# Patient Record
Sex: Female | Born: 1945
Health system: Southern US, Community
[De-identification: ages and names within clinical notes are randomized; demographics above are authoritative.]

## PROBLEM LIST (undated history)

## (undated) DIAGNOSIS — E7800 Pure hypercholesterolemia, unspecified: Secondary | ICD-10-CM

## (undated) DIAGNOSIS — I2510 Atherosclerotic heart disease of native coronary artery without angina pectoris: Secondary | ICD-10-CM

## (undated) DIAGNOSIS — K219 Gastro-esophageal reflux disease without esophagitis: Secondary | ICD-10-CM

## (undated) DIAGNOSIS — R569 Unspecified convulsions: Secondary | ICD-10-CM

## (undated) DIAGNOSIS — I493 Ventricular premature depolarization: Secondary | ICD-10-CM

## (undated) DIAGNOSIS — I10 Essential (primary) hypertension: Secondary | ICD-10-CM

## (undated) DIAGNOSIS — N390 Urinary tract infection, site not specified: Secondary | ICD-10-CM

## (undated) DIAGNOSIS — E079 Disorder of thyroid, unspecified: Secondary | ICD-10-CM

## (undated) DIAGNOSIS — M1990 Unspecified osteoarthritis, unspecified site: Secondary | ICD-10-CM

## (undated) DIAGNOSIS — M545 Low back pain, unspecified: Secondary | ICD-10-CM

## (undated) DIAGNOSIS — G8929 Other chronic pain: Secondary | ICD-10-CM

## (undated) DIAGNOSIS — K5792 Diverticulitis of intestine, part unspecified, without perforation or abscess without bleeding: Secondary | ICD-10-CM

## (undated) DIAGNOSIS — G44009 Cluster headache syndrome, unspecified, not intractable: Secondary | ICD-10-CM

## (undated) HISTORY — PX: VASCULAR SURGERY: SHX849

## (undated) HISTORY — PX: JOINT REPLACEMENT: SHX530

## (undated) HISTORY — DX: Ventricular premature depolarization: I49.3

---

## 1968-10-26 HISTORY — PX: TUBAL LIGATION: SHX77

## 1985-10-26 HISTORY — PX: ABDOMINAL HYSTERECTOMY: SHX81

## 1993-10-26 HISTORY — PX: CORONARY ANGIOPLASTY WITH STENT PLACEMENT: SHX49

## 1998-05-31 ENCOUNTER — Encounter: Admission: RE | Admit: 1998-05-31 | Discharge: 1998-05-31 | Payer: Self-pay | Admitting: Family Medicine

## 1998-06-14 ENCOUNTER — Encounter: Admission: RE | Admit: 1998-06-14 | Discharge: 1998-06-14 | Payer: Self-pay | Admitting: Family Medicine

## 1998-08-27 ENCOUNTER — Encounter: Admission: RE | Admit: 1998-08-27 | Discharge: 1998-08-27 | Payer: Self-pay | Admitting: Family Medicine

## 1998-09-02 ENCOUNTER — Encounter: Admission: RE | Admit: 1998-09-02 | Discharge: 1998-09-02 | Payer: Self-pay | Admitting: Family Medicine

## 1999-04-09 ENCOUNTER — Encounter: Admission: RE | Admit: 1999-04-09 | Discharge: 1999-04-09 | Payer: Self-pay | Admitting: Family Medicine

## 2000-01-19 ENCOUNTER — Encounter: Admission: RE | Admit: 2000-01-19 | Discharge: 2000-01-19 | Payer: Self-pay | Admitting: Family Medicine

## 2000-01-21 ENCOUNTER — Encounter: Admission: RE | Admit: 2000-01-21 | Discharge: 2000-01-21 | Payer: Self-pay | Admitting: Family Medicine

## 2000-02-26 ENCOUNTER — Other Ambulatory Visit: Admission: RE | Admit: 2000-02-26 | Discharge: 2000-02-26 | Payer: Self-pay | Admitting: Obstetrics and Gynecology

## 2000-04-13 ENCOUNTER — Encounter: Admission: RE | Admit: 2000-04-13 | Discharge: 2000-04-13 | Payer: Self-pay | Admitting: Sports Medicine

## 2001-01-11 ENCOUNTER — Ambulatory Visit (HOSPITAL_COMMUNITY): Admission: RE | Admit: 2001-01-11 | Discharge: 2001-01-11 | Payer: Self-pay | Admitting: Emergency Medicine

## 2001-02-17 ENCOUNTER — Encounter: Payer: Self-pay | Admitting: Orthopaedic Surgery

## 2001-02-22 ENCOUNTER — Inpatient Hospital Stay (HOSPITAL_COMMUNITY): Admission: RE | Admit: 2001-02-22 | Discharge: 2001-02-27 | Payer: Self-pay | Admitting: Orthopaedic Surgery

## 2001-08-15 ENCOUNTER — Other Ambulatory Visit: Admission: RE | Admit: 2001-08-15 | Discharge: 2001-08-15 | Payer: Self-pay | Admitting: Obstetrics and Gynecology

## 2001-11-24 ENCOUNTER — Ambulatory Visit (HOSPITAL_COMMUNITY): Admission: RE | Admit: 2001-11-24 | Discharge: 2001-11-24 | Payer: Self-pay | Admitting: Gastroenterology

## 2001-11-24 ENCOUNTER — Encounter (INDEPENDENT_AMBULATORY_CARE_PROVIDER_SITE_OTHER): Payer: Self-pay | Admitting: Specialist

## 2002-05-30 ENCOUNTER — Inpatient Hospital Stay (HOSPITAL_COMMUNITY): Admission: EM | Admit: 2002-05-30 | Discharge: 2002-06-05 | Payer: Self-pay | Admitting: Emergency Medicine

## 2002-05-30 ENCOUNTER — Encounter: Payer: Self-pay | Admitting: Emergency Medicine

## 2002-05-31 ENCOUNTER — Encounter: Payer: Self-pay | Admitting: Interventional Cardiology

## 2002-06-09 ENCOUNTER — Encounter: Payer: Self-pay | Admitting: Emergency Medicine

## 2002-06-09 ENCOUNTER — Emergency Department (HOSPITAL_COMMUNITY): Admission: EM | Admit: 2002-06-09 | Discharge: 2002-06-09 | Payer: Self-pay | Admitting: Emergency Medicine

## 2002-06-20 ENCOUNTER — Encounter (HOSPITAL_COMMUNITY): Admission: RE | Admit: 2002-06-20 | Discharge: 2002-09-18 | Payer: Self-pay | Admitting: Interventional Cardiology

## 2003-10-27 HISTORY — PX: TOTAL KNEE ARTHROPLASTY: SHX125

## 2004-01-29 ENCOUNTER — Emergency Department (HOSPITAL_COMMUNITY): Admission: AD | Admit: 2004-01-29 | Discharge: 2004-01-29 | Payer: Self-pay | Admitting: Emergency Medicine

## 2004-02-20 ENCOUNTER — Inpatient Hospital Stay (HOSPITAL_COMMUNITY): Admission: EM | Admit: 2004-02-20 | Discharge: 2004-02-22 | Payer: Self-pay | Admitting: Emergency Medicine

## 2004-02-22 ENCOUNTER — Encounter (INDEPENDENT_AMBULATORY_CARE_PROVIDER_SITE_OTHER): Payer: Self-pay | Admitting: Specialist

## 2004-02-26 ENCOUNTER — Inpatient Hospital Stay (HOSPITAL_COMMUNITY): Admission: EM | Admit: 2004-02-26 | Discharge: 2004-02-28 | Payer: Self-pay | Admitting: Emergency Medicine

## 2004-06-24 ENCOUNTER — Other Ambulatory Visit: Admission: RE | Admit: 2004-06-24 | Discharge: 2004-06-24 | Payer: Self-pay | Admitting: Obstetrics and Gynecology

## 2005-04-06 ENCOUNTER — Encounter: Admission: RE | Admit: 2005-04-06 | Discharge: 2005-04-06 | Payer: Self-pay | Admitting: Otolaryngology

## 2005-09-07 ENCOUNTER — Emergency Department (HOSPITAL_COMMUNITY): Admission: EM | Admit: 2005-09-07 | Discharge: 2005-09-07 | Payer: Self-pay | Admitting: Emergency Medicine

## 2006-05-21 ENCOUNTER — Emergency Department (HOSPITAL_COMMUNITY): Admission: EM | Admit: 2006-05-21 | Discharge: 2006-05-21 | Payer: Self-pay | Admitting: Emergency Medicine

## 2006-06-09 ENCOUNTER — Emergency Department (HOSPITAL_COMMUNITY): Admission: EM | Admit: 2006-06-09 | Discharge: 2006-06-09 | Payer: Self-pay | Admitting: Emergency Medicine

## 2006-07-09 ENCOUNTER — Inpatient Hospital Stay (HOSPITAL_COMMUNITY): Admission: AD | Admit: 2006-07-09 | Discharge: 2006-07-13 | Payer: Self-pay | Admitting: Interventional Cardiology

## 2006-07-15 ENCOUNTER — Other Ambulatory Visit: Admission: RE | Admit: 2006-07-15 | Discharge: 2006-07-15 | Payer: Self-pay | Admitting: Obstetrics and Gynecology

## 2007-03-03 ENCOUNTER — Emergency Department (HOSPITAL_COMMUNITY): Admission: EM | Admit: 2007-03-03 | Discharge: 2007-03-03 | Payer: Self-pay | Admitting: Emergency Medicine

## 2008-04-21 ENCOUNTER — Ambulatory Visit: Payer: Self-pay | Admitting: Internal Medicine

## 2008-04-21 ENCOUNTER — Inpatient Hospital Stay (HOSPITAL_COMMUNITY): Admission: EM | Admit: 2008-04-21 | Discharge: 2008-04-24 | Payer: Self-pay | Admitting: Emergency Medicine

## 2008-07-31 ENCOUNTER — Encounter: Payer: Self-pay | Admitting: Obstetrics and Gynecology

## 2008-07-31 ENCOUNTER — Other Ambulatory Visit: Admission: RE | Admit: 2008-07-31 | Discharge: 2008-07-31 | Payer: Self-pay | Admitting: Obstetrics and Gynecology

## 2008-07-31 ENCOUNTER — Ambulatory Visit: Payer: Self-pay | Admitting: Obstetrics and Gynecology

## 2008-09-26 ENCOUNTER — Ambulatory Visit: Payer: Self-pay | Admitting: Obstetrics and Gynecology

## 2008-12-13 ENCOUNTER — Ambulatory Visit: Payer: Self-pay | Admitting: Women's Health

## 2009-01-03 ENCOUNTER — Ambulatory Visit: Payer: Self-pay | Admitting: Obstetrics and Gynecology

## 2009-04-20 ENCOUNTER — Emergency Department (HOSPITAL_COMMUNITY): Admission: EM | Admit: 2009-04-20 | Discharge: 2009-04-20 | Payer: Self-pay | Admitting: Emergency Medicine

## 2009-07-06 ENCOUNTER — Observation Stay (HOSPITAL_COMMUNITY): Admission: EM | Admit: 2009-07-06 | Discharge: 2009-07-09 | Payer: Self-pay | Admitting: Emergency Medicine

## 2009-07-09 ENCOUNTER — Encounter (INDEPENDENT_AMBULATORY_CARE_PROVIDER_SITE_OTHER): Payer: Self-pay | Admitting: Internal Medicine

## 2009-08-02 ENCOUNTER — Emergency Department (HOSPITAL_COMMUNITY): Admission: EM | Admit: 2009-08-02 | Discharge: 2009-08-02 | Payer: Self-pay | Admitting: Emergency Medicine

## 2009-12-04 ENCOUNTER — Observation Stay (HOSPITAL_COMMUNITY): Admission: EM | Admit: 2009-12-04 | Discharge: 2009-12-04 | Payer: Self-pay | Admitting: Emergency Medicine

## 2009-12-25 ENCOUNTER — Ambulatory Visit: Payer: Self-pay | Admitting: Obstetrics and Gynecology

## 2010-06-25 ENCOUNTER — Encounter: Admission: RE | Admit: 2010-06-25 | Discharge: 2010-06-25 | Payer: Self-pay | Admitting: Internal Medicine

## 2010-07-10 ENCOUNTER — Observation Stay (HOSPITAL_COMMUNITY): Admission: EM | Admit: 2010-07-10 | Discharge: 2010-07-11 | Payer: Self-pay | Admitting: Emergency Medicine

## 2010-07-30 ENCOUNTER — Emergency Department (HOSPITAL_COMMUNITY): Admission: EM | Admit: 2010-07-30 | Discharge: 2010-07-30 | Payer: Self-pay | Admitting: Emergency Medicine

## 2010-11-16 ENCOUNTER — Encounter: Payer: Self-pay | Admitting: Internal Medicine

## 2011-01-07 LAB — POCT I-STAT, CHEM 8
Calcium, Ion: 1.24 mmol/L (ref 1.12–1.32)
Creatinine, Ser: 1 mg/dL (ref 0.4–1.2)
Glucose, Bld: 104 mg/dL — ABNORMAL HIGH (ref 70–99)
Hemoglobin: 13.6 g/dL (ref 12.0–15.0)
Potassium: 3.6 mEq/L (ref 3.5–5.1)

## 2011-01-07 LAB — POCT CARDIAC MARKERS
Myoglobin, poc: 53.2 ng/mL (ref 12–200)
Troponin i, poc: <0.05 ng/mL (ref 0.00–0.09)
Troponin i, poc: <0.05 ng/mL (ref 0.00–0.09)
Troponin i, poc: <0.05 ng/mL (ref 0.00–0.09)

## 2011-01-07 LAB — CBC
Hemoglobin: 12.2 g/dL (ref 12.0–15.0)
MCH: 31 pg (ref 26.0–34.0)
RBC: 3.94 MIL/uL (ref 3.87–5.11)

## 2011-01-07 LAB — DIFFERENTIAL
Basophils Relative: 1 % (ref 0–1)
Eosinophils Absolute: 0.3 10*3/uL (ref 0.0–0.7)
Eosinophils Relative: 7 % — ABNORMAL HIGH (ref 0–5)
Neutrophils Relative %: 56 % (ref 43–77)

## 2011-01-08 LAB — BASIC METABOLIC PANEL
BUN: 12 mg/dL (ref 6–23)
Calcium: 9.3 mg/dL (ref 8.4–10.5)
GFR calc non Af Amer: >60 mL/min (ref >60)
Glucose, Bld: 129 mg/dL — ABNORMAL HIGH (ref 70–99)
Potassium: 3.3 mEq/L — ABNORMAL LOW (ref 3.5–5.1)

## 2011-01-08 LAB — CARDIAC PANEL(CRET KIN+CKTOT+MB+TROPI)
CK, MB: 1.1 ng/mL (ref 0.3–4.0)
CK, MB: 1.2 ng/mL (ref 0.3–4.0)
Relative Index: INVALID (ref 0.0–2.5)
Total CK: 66 U/L (ref 7–177)
Troponin I: 0.05 ng/mL (ref 0.00–0.06)

## 2011-01-08 LAB — CBC
HCT: 35 % — ABNORMAL LOW (ref 36.0–46.0)
HCT: 40.1 % (ref 36.0–46.0)
Hemoglobin: 11.8 g/dL — ABNORMAL LOW (ref 12.0–15.0)
Hemoglobin: 13.4 g/dL (ref 12.0–15.0)
MCH: 30.6 pg (ref 26.0–34.0)
MCHC: 33.7 g/dL (ref 30.0–36.0)
MCV: 90.9 fL (ref 78.0–100.0)
RDW: 13.6 % (ref 11.5–15.5)

## 2011-01-08 LAB — POCT I-STAT, CHEM 8
Calcium, Ion: 1.21 mmol/L (ref 1.12–1.32)
Chloride: 107 mEq/L (ref 96–112)
HCT: 44 % (ref 36.0–46.0)
Hemoglobin: 15 g/dL (ref 12.0–15.0)
Potassium: 3 mEq/L — ABNORMAL LOW (ref 3.5–5.1)

## 2011-01-08 LAB — APTT: aPTT: 33 seconds (ref 24–37)

## 2011-01-08 LAB — POCT CARDIAC MARKERS
CKMB, poc: <1 ng/mL — ABNORMAL LOW (ref 1.0–8.0)
Troponin i, poc: <0.05 ng/mL (ref 0.00–0.09)

## 2011-01-08 LAB — DIFFERENTIAL
Basophils Absolute: 0 10*3/uL (ref 0.0–0.1)
Basophils Relative: 1 % (ref 0–1)
Lymphocytes Relative: 43 % (ref 12–46)
Neutro Abs: 1.8 10*3/uL (ref 1.7–7.7)

## 2011-01-08 LAB — TROPONIN I: Troponin I: 0.03 ng/mL (ref 0.00–0.06)

## 2011-01-08 LAB — CK TOTAL AND CKMB (NOT AT ARMC): CK, MB: 1 ng/mL (ref 0.3–4.0)

## 2011-01-14 LAB — POCT I-STAT, CHEM 8
Calcium, Ion: 1.15 mmol/L (ref 1.12–1.32)
Creatinine, Ser: 1.1 mg/dL (ref 0.4–1.2)
Glucose, Bld: 100 mg/dL — ABNORMAL HIGH (ref 70–99)
HCT: 51 % — ABNORMAL HIGH (ref 36.0–46.0)
Hemoglobin: 17.3 g/dL — ABNORMAL HIGH (ref 12.0–15.0)
Potassium: 3.1 mEq/L — ABNORMAL LOW (ref 3.5–5.1)

## 2011-01-14 LAB — PROTIME-INR
INR: 1.11 (ref 0.00–1.49)
Prothrombin Time: 14.2 seconds (ref 11.6–15.2)

## 2011-01-14 LAB — COMPREHENSIVE METABOLIC PANEL
ALT: 26 U/L (ref 0–35)
AST: 26 U/L (ref 0–37)
Albumin: 3.6 g/dL (ref 3.5–5.2)
Albumin: 4.6 g/dL (ref 3.5–5.2)
Alkaline Phosphatase: 64 U/L (ref 39–117)
BUN: 17 mg/dL (ref 6–23)
Calcium: 9.5 mg/dL (ref 8.4–10.5)
GFR calc Af Amer: >60 mL/min (ref >60)
Glucose, Bld: 93 mg/dL (ref 70–99)
Potassium: 3.4 mEq/L — ABNORMAL LOW (ref 3.5–5.1)
Sodium: 138 mEq/L (ref 135–145)
Sodium: 140 mEq/L (ref 135–145)
Total Protein: 7.4 g/dL (ref 6.0–8.3)
Total Protein: 8.9 g/dL — ABNORMAL HIGH (ref 6.0–8.3)

## 2011-01-14 LAB — CBC
HCT: 46.1 % — ABNORMAL HIGH (ref 36.0–46.0)
Hemoglobin: 15.6 g/dL — ABNORMAL HIGH (ref 12.0–15.0)
MCHC: 33.8 g/dL (ref 30.0–36.0)
Platelets: 236 10*3/uL (ref 150–400)
RDW: 14.4 % (ref 11.5–15.5)

## 2011-01-14 LAB — URINE MICROSCOPIC-ADD ON

## 2011-01-14 LAB — URINE CULTURE: Colony Count: 3000

## 2011-01-14 LAB — DIFFERENTIAL
Lymphs Abs: 1.4 10*3/uL (ref 0.7–4.0)
Monocytes Absolute: 0.6 10*3/uL (ref 0.1–1.0)
Monocytes Relative: 13 % — ABNORMAL HIGH (ref 3–12)
Neutro Abs: 2.6 10*3/uL (ref 1.7–7.7)
Neutrophils Relative %: 56 % (ref 43–77)

## 2011-01-14 LAB — URINALYSIS, ROUTINE W REFLEX MICROSCOPIC
Glucose, UA: NEGATIVE mg/dL
Hgb urine dipstick: NEGATIVE
Ketones, ur: 15 mg/dL — AB
Protein, ur: 30 mg/dL — AB
Urobilinogen, UA: 1 mg/dL (ref 0.0–1.0)

## 2011-01-29 LAB — DIFFERENTIAL
Basophils Absolute: 0 10*3/uL (ref 0.0–0.1)
Lymphocytes Relative: 40 % (ref 12–46)
Monocytes Absolute: 0.4 10*3/uL (ref 0.1–1.0)
Neutro Abs: 2.1 10*3/uL (ref 1.7–7.7)

## 2011-01-29 LAB — COMPREHENSIVE METABOLIC PANEL
Albumin: 3.8 g/dL (ref 3.5–5.2)
BUN: 12 mg/dL (ref 6–23)
Chloride: 107 mEq/L (ref 96–112)
Creatinine, Ser: 0.93 mg/dL (ref 0.4–1.2)
GFR calc non Af Amer: >60 mL/min (ref >60)
Total Bilirubin: 0.6 mg/dL (ref 0.3–1.2)

## 2011-01-29 LAB — POCT CARDIAC MARKERS
CKMB, poc: <1 ng/mL — ABNORMAL LOW (ref 1.0–8.0)
Troponin i, poc: <0.05 ng/mL (ref 0.00–0.09)

## 2011-01-29 LAB — CBC
HCT: 33.5 % — ABNORMAL LOW (ref 36.0–46.0)
MCHC: 33.7 g/dL (ref 30.0–36.0)
MCV: 97.7 fL (ref 78.0–100.0)
Platelets: 185 10*3/uL (ref 150–400)
RDW: 14.9 % (ref 11.5–15.5)
WBC: 4.4 10*3/uL (ref 4.0–10.5)

## 2011-01-30 LAB — COMPREHENSIVE METABOLIC PANEL
AST: 18 U/L (ref 0–37)
Albumin: 3.8 g/dL (ref 3.5–5.2)
Albumin: 3.8 g/dL (ref 3.5–5.2)
Alkaline Phosphatase: 55 U/L (ref 39–117)
BUN: 16 mg/dL (ref 6–23)
BUN: 9 mg/dL (ref 6–23)
Chloride: 107 mEq/L (ref 96–112)
Creatinine, Ser: 0.82 mg/dL (ref 0.4–1.2)
Creatinine, Ser: 0.84 mg/dL (ref 0.4–1.2)
GFR calc Af Amer: >60 mL/min (ref >60)
Potassium: 3.5 mEq/L (ref 3.5–5.1)
Total Bilirubin: 0.8 mg/dL (ref 0.3–1.2)
Total Protein: 6.8 g/dL (ref 6.0–8.3)

## 2011-01-30 LAB — URINALYSIS, ROUTINE W REFLEX MICROSCOPIC
Glucose, UA: NEGATIVE mg/dL
Hgb urine dipstick: NEGATIVE
Specific Gravity, Urine: 1.026 (ref 1.005–1.030)
pH: 6 (ref 5.0–8.0)

## 2011-01-30 LAB — DIFFERENTIAL
Basophils Absolute: 0 10*3/uL (ref 0.0–0.1)
Lymphocytes Relative: 26 % (ref 12–46)
Lymphocytes Relative: 26 % (ref 12–46)
Monocytes Absolute: 0.4 10*3/uL (ref 0.1–1.0)
Monocytes Absolute: 0.4 10*3/uL (ref 0.1–1.0)
Monocytes Relative: 7 % (ref 3–12)
Neutro Abs: 3.6 10*3/uL (ref 1.7–7.7)
Neutro Abs: 3.6 10*3/uL (ref 1.7–7.7)

## 2011-01-30 LAB — CK TOTAL AND CKMB (NOT AT ARMC)
CK, MB: 1.1 ng/mL (ref 0.3–4.0)
CK, MB: 1.4 ng/mL (ref 0.3–4.0)
Relative Index: 1.4 (ref 0.0–2.5)
Relative Index: INVALID (ref 0.0–2.5)
Total CK: 77 U/L (ref 7–177)

## 2011-01-30 LAB — BASIC METABOLIC PANEL
BUN: 10 mg/dL (ref 6–23)
Calcium: 9 mg/dL (ref 8.4–10.5)
Creatinine, Ser: 1.01 mg/dL (ref 0.4–1.2)
GFR calc non Af Amer: 56 mL/min — ABNORMAL LOW (ref >60)
Glucose, Bld: 109 mg/dL — ABNORMAL HIGH (ref 70–99)
Sodium: 138 mEq/L (ref 135–145)

## 2011-01-30 LAB — CBC
HCT: 39.1 % (ref 36.0–46.0)
Hemoglobin: 12.3 g/dL (ref 12.0–15.0)
MCV: 98.1 fL (ref 78.0–100.0)
Platelets: 234 10*3/uL (ref 150–400)
RDW: 14.8 % (ref 11.5–15.5)
RDW: 14.9 % (ref 11.5–15.5)
WBC: 5.6 10*3/uL (ref 4.0–10.5)
WBC: 5.6 10*3/uL (ref 4.0–10.5)

## 2011-01-30 LAB — CARDIAC PANEL(CRET KIN+CKTOT+MB+TROPI)
Relative Index: INVALID (ref 0.0–2.5)
Total CK: 72 U/L (ref 7–177)
Troponin I: 0.03 ng/mL (ref 0.00–0.06)

## 2011-01-30 LAB — TROPONIN I: Troponin I: 0.03 ng/mL (ref 0.00–0.06)

## 2011-01-30 LAB — MAGNESIUM: Magnesium: 2.1 mg/dL (ref 1.5–2.5)

## 2011-02-02 LAB — COMPREHENSIVE METABOLIC PANEL
ALT: 19 U/L (ref 0–35)
AST: 21 U/L (ref 0–37)
Albumin: 3.8 g/dL (ref 3.5–5.2)
Alkaline Phosphatase: 62 U/L (ref 39–117)
BUN: 11 mg/dL (ref 6–23)
CO2: 29 mEq/L (ref 19–32)
Calcium: 9.1 mg/dL (ref 8.4–10.5)
Chloride: 107 mEq/L (ref 96–112)
Creatinine, Ser: 1.05 mg/dL (ref 0.4–1.2)
GFR calc Af Amer: >60 mL/min (ref >60)
GFR calc non Af Amer: 53 mL/min — ABNORMAL LOW (ref >60)
Glucose, Bld: 99 mg/dL (ref 70–99)
Potassium: 2.8 mEq/L — ABNORMAL LOW (ref 3.5–5.1)
Sodium: 141 mEq/L (ref 135–145)
Total Bilirubin: 0.9 mg/dL (ref 0.3–1.2)
Total Protein: 7.2 g/dL (ref 6.0–8.3)

## 2011-02-02 LAB — POCT CARDIAC MARKERS
Myoglobin, poc: 76.3 ng/mL (ref 12–200)
Troponin i, poc: <0.05 ng/mL (ref 0.00–0.09)

## 2011-02-02 LAB — URINALYSIS, ROUTINE W REFLEX MICROSCOPIC
Bilirubin Urine: NEGATIVE
Ketones, ur: NEGATIVE mg/dL
Nitrite: NEGATIVE
Protein, ur: NEGATIVE mg/dL
Urobilinogen, UA: 0.2 mg/dL (ref 0.0–1.0)

## 2011-03-10 NOTE — H&P (Signed)
NAMEKEYANI, RIGDON                ACCOUNT NO.:  [id number]   MEDICAL RECORD NO.:  [id number]          PATIENT TYPE:  INP   LOCATION:  4730                         FACILITY:  [facility]   PHYSICIAN:  Gardiner Barefoot, MD    DATE OF BIRTH:  [birth year]   DATE OF ADMISSION:  04/21/2008  DATE OF DISCHARGE:                              HISTORY & PHYSICAL   PRIMARY CARE PHYSICIAN:  Theressa Millard, MD, of [facility].   CHIEF COMPLAINT:  Chest pain.   HISTORY OF PRESENT ILLNESS:  This is a 65 year old female with a history  of non-ST-elevation MI in 2003 with notable coronary spasm on cath that  responded well to intracoronary nitroglycerin and no stent placement  necessary, who presents here with 3 weeks of worsening intermittent  substernal chest pain, tightness, and pressure.  The patient reports  that she always has used intermittent sublingual nitro ever since her  last catheterization in 2007, and however, over the last several weeks,  this has been more frequent.  She does report nearly daily use of  nitroglycerin sublingual, which does relieve her chest pressure.  She  also does report some shortness of breath with these exacerbations.  The  patient denies any recent illnesses, no radiation to her jaw or to her  arm, and at this time is not having any chest pain.   PAST MEDICAL HISTORY:  1. CAD with coronary artery spasm.  2. History of Prinzmetal angina.  3. NSTEMI in 2003, LV dysfunction with an EF of 40-50%.  4. Hypertension.  5. Dyslipidemia.  6. History of palpitations.  7. History of migraines.  8. Urinary stress incontinence.  9. History of ischemic colitis with GI bleed.  10.Depression.  11.GERD with hiatal hernia.  12.History of partial hysterectomy.  13.History of right total knee arthropathy.   ALLERGIES:  SULFA.   MEDICATIONS:  1. Detrol LA 4 mg p.o. daily.  2. Aspirin 325 mg daily.  3. Effexor XR 150 mg daily.  4. Nexium 40 mg daily.  5. Imdur 120 mg  daily.  6. Sublingual nitro 0.4 mg p.r.n.  7. Coreg 3.125 mg b.i.d., though the patient does report increase in      her blood pressure medication, I suspect this is a higher dose.  8. Caduet 10/20 mg p.o. daily.  9. Zetia 10 mg daily.  10.Potassium chloride 10 mEq p.o. b.i.d.   SOCIAL HISTORY:  She denies any alcohol, tobacco, or drugs.  She has 4  adult children.   FAMILY HISTORY:  Does include early cardiac death in her brother with an  MI at 75.  Also, father with history of MI and CVA, mother with MI at  62, brother with an MI who is living, another sister with MI requiring  stents at age 65.   REVIEW OF SYSTEMS:  Negative except as per the history present illness.   PHYSICAL EXAMINATION:  VITAL SIGNS:  Temperature is 98.5, pulse is 76,  respirations 18, blood pressure is 129/74, and O2 sat is 99%.  GENERAL:  The patient is awake, alert, oriented x3 and appears  mildly  anxious.  CARDIOVASCULAR:  Regular rate and rhythm.  No murmurs, rubs, or gallops.  LUNGS:  Clear to auscultation bilaterally.  ABDOMEN:  Soft, nontender, nondistended.  Positive bowel sounds.  No  hepatosplenomegaly.  EXTREMITIES:  No cyanosis, clubbing, or edema.   LABORATORY DATA:  Includes the UA with positive nitrites and wbc's and  hematuria.  CK-MB is less than 1.  Troponin is less than 0.05,  hemoglobin 13, WBC 10, platelets 291, sodium 139, potassium 4.4,  chloride 105, glucose 101, CT head with no acute disease.   IMPRESSION AND PLAN:  1. Chest pain.  We will admit the patient to telemetry, check serial      enzymes.  Also, we will start nitroglycerin drip.  She has required      multiple sublingual nitroglycerin doses.  Also, hold beta-blocker      for question of whether or not she is going to get stress test, and      likely we will need Cardiology evaluation in the a.m.  She does see      [facility].  We will also have the patient n.p.o. after      midnight in case if there is any  intervention needed.  2. Headache.  The patient did have a CT of the head.  She does      describe this pain in the left side as a sharp pain.  No etiology      of the headache noted on exam nor on CT.  3. Urinary tract infection.  The patient does report symptoms with      dysuria and frequency.  We will start her on Cipro.  Urine culture      will be sent.      Gardiner Barefoot, MD  Electronically Signed     RWC/MEDQ  D:  04/21/2008  T:  04/22/2008  Job:  [id number]

## 2011-03-10 NOTE — Consult Note (Signed)
NAMEJENIECE, HANNIS                ACCOUNT NO.:  [id number]   MEDICAL RECORD NO.:  [id number]          PATIENT TYPE:  INP   LOCATION:  4730                         FACILITY:  [facility]   PHYSICIAN:  Armanda Magic, M.D.     DATE OF BIRTH:  1946/03/11   DATE OF CONSULTATION:  04/22/2008  DATE OF DISCHARGE:                                 CONSULTATION   REFERRING PHYSICIAN:  Corinna L. Lendell Caprice, MD.   CHIEF COMPLAINT:  Chest pain.   HISTORY OF PRESENT ILLNESS:  This is a 65 year old African American  female with a history of non-ST-elevation MI in 2003 secondary to  diffuse and severe coronary vasospasm.  She then had a repeat cardiac  cath in 2007 secondary to chest pain which showed nonobstructive disease  of 20% in the LAD and RCA.  It was felt at that time that her chest pain  was due to the vasospasm and noncardiac.  She has had 3 weeks of  worsening intermittent chest pain and pressure relieved with sublingual  nitroglycerin, although she has to take up to 6 nitroglycerin during a  day.  She denies any radiation of the chest pain, although she has been  short of breath with it.  She denies any nausea, vomiting, or  diaphoresis.  We are now asked to consult.   PAST MEDICAL HISTORY:  Includes;  1. Non-ST-elevation MI in 2003 secondary to vasospasm.  At that time,      there was diffuse, intense coronary vasospasm and there was an LAD      lesion of 50%.  2. Coronary artery disease with coronary vasospasm, cath in 2007 had      20% LAD and RCA stenosis.  3. Prinzmetal angina.  4. Hypertension.  5. Dyslipidemia.  6. Migraine headaches.  7. Urinary stress incontinence.  8. Ischemic colitis with GI bleed.  9. Depression.  10.Mild LV dysfunction, EF 40-50%.   PAST SURGICAL HISTORY:  Status post partial hysterectomy, status post  right total knee arthroplasty.   SOCIAL HISTORY:  She denies any tobacco or alcohol use.  No IV drug use,  has 4 children.   FAMILY HISTORY:  A brother  died of MI at 78.  Her mother had an MI at  72.  Her father had had an MI and CVA.  She has a brother living with an  MI and a sister who had an MI 3 with a stent placed.   ALLERGIES:  SULFA.   MEDICATION:  Include;  1. Detrol LA 4 mg a day.  2. Aspirin 325 mg a day.  3. Effexor XR 150 mg a day.  4. Nexium 40 mg a day.  5. Imdur 120 mg a day.  6. Coreg 3.125 mg b.i.d.  7. Caduet 10/20 mg a day.  8. Zetia 10 mg a day.  9. KCl 10 mEq b.i.d.   REVIEW OF SYSTEMS:  All review of systems were negative except for what  is in the HPI.   PHYSICAL EXAMINATION:  Blood pressure is 120/73, heart rate 77.  This is  a well-developed, well-nourished black  female in no distress.  HEENT:  Benign.  NECK:  Supple without lymphadenopathy.  Carotid upstrokes 2+.  No  bruits.  LUNGS:  Clear to auscultation throughout.  HEART:  Regular rate and rhythm.  No murmurs, rubs, or gallops.  Normal  S1 and S2.  ABDOMEN:  Soft, nontender, nondistended with active bowel sounds.  No  hepatosplenomegaly.  EXTREMITIES:  No cyanosis, erythema, or edema.   LABORATORY DATA:  CPK-MB, troponin negative x2.  Sodium 139, potassium  3, chloride 105, bicarb 26, BUN 8, and creatinine 0.82.  White blood  cell count 6.6, hemoglobin 0.9, hematocrit 35.4, and platelet count 214.  EKG shows sinus rhythm with T-wave inversions in V1-V4 which is  unchanged from an EKG in May 2008.   ASSESSMENT:  1. Chest pain syndrome with negative cardiac enzymes and EKG with      nonspecific T-wave changes unchanged from May 2008.  The patient is      requiring higher dose of nitrate secondary to chest pain.  Chest      pain may be secondary to worsening Prinzmetal angina, but also need      to consider progression of coronary disease given that her last      cath in 2007 showed 20% stenosis in the RCA and LAD.  2. Prinzmetal angina.  3. Remote non-ST-elevation myocardial infarction secondary to      vasospasm.  4. Hypokalemia.  5.  Hypertension.  6. Dyslipidemia.  7. Urinary tract infection on Cipro.   PLAN:  Cardiac catheterization on June 29.  Change Lovenox to 1 mg/kg  per pharmacy protocol until cath.  Continue aspirin and IV nitroglycerin  drip, replete potassium.      Armanda Magic, M.D.  Electronically Signed     TT/MEDQ  D:  04/22/2008  T:  04/22/2008  Job:  [id number]   cc:   Lyn Records, M.D.

## 2011-03-13 NOTE — H&P (Signed)
NAMEBEAUTY, PLESS                ACCOUNT NO.:  [id number]   MEDICAL RECORD NO.:  [id number]          PATIENT TYPE:  INP   LOCATION:  3703                         FACILITY:  [facility]   PHYSICIAN:  Armanda Magic, M.D.     DATE OF BIRTH:  23-Aug-1946   DATE OF ADMISSION:  07/09/2006  DATE OF DISCHARGE:                                HISTORY & PHYSICAL   PRIMARY CARE PHYSICIAN:  Oley Balm. Georgina Pillion, M.D. with [facility] Sprang at [place].   CARDIOLOGIST:  Lyn Records, M.D.   CHIEF COMPLAINT:  Chest pain.   HISTORY OF PRESENT ILLNESS:  Ms. Wojdyla is a 65 year old African-American  female who is well known to Dr. Verdis Prime.  She has a history of coronary  artery disease, status post NSTE MI in 2003, status post nonobstructive  coronary disease by cardiac catheterization in 2003 with coronary vasospasm  that was relieved with medical therapy.  During this cardiac  catheterization, there was a 50% mid to distal LAD artery lesion that was  unchanged from 1998.  There was perhaps also a 30-50% narrowing in the  ostium of the second diagonal that had arisen from the same area of the LAD.  The circumflex and right coronary arteries contained luminal irregularities;  however, again, there was no significant obstructive lesions after the  intracoronary nitroglycerin.  There was mild LV dysfunction with a EF of 40-  50% by cardiac catheterization in 2003.  The patient also has a history of  hypertension and dyslipidemia.   Today, the patient walked into the [facility] cardiology office complaining of  chest discomfort for the past two weeks.  She was seen by Dr. Armanda Magic  in Dr. Michaelle Copas absence.  This chest pain is both nonexertional and  exertional and located in the mid sternal area that extends to under her  left breast.  She describes the pain as a dull ache and is rated as a 5-  6/10.  The duration of the pain is up to 30 minutes.  On last week, she  experienced a piercing pain three times  continuously, right behind one  another, that lasted for a split second.  The chest pain radiates to her  left arm, leaving her arm with a sensation of numbness and tingling.  Associated with the chest pain is shortness of breath, diaphoresis, nausea,  and occasional dizziness.  The patient denies vomiting and syncope.  She has  a history of migraines and complains of a headache.  The chest pain is not  worsened by change in positions, movement, meals, and is not reproducible.  The patient has a history of chest pain and indicated that this chest pain  feels very similar in quality and character to previous chest pain that she  has had in the past; however, it is not as intense.  The patient uses  sublingual nitroglycerin 0.4 mg as needed for chest pain.  For the past two  weeks, she has had to use nitroglycerin almost daily with relief.  Today,  she took one tablet, this morning, with pain improvement; however, at  approximately 1:15 p.m., she had to take another tablet with total pain  relief.  For the past two weeks, she has checked her blood pressure at local  pharmacies and has obtained elevated readings of 150-160/90-99.  The patient  admits to a headache during today's evaluation; however, denies chest pain  during today's visit.   A 12-lead EKG was obtained, revealing sinus rhythm with occasional PVC's  with a ventricular rate of 71 beats per minute.  Nonspecific ST-T wave  abnormalities were noted; however, there were no ischemic changes.  This is  consistent with the previous EKG obtained on December 01, 2005.   A normal stress Cardiolite was performed on December 09, 2005 and revealed  no evidence of infarction or ischemia with a normal wall motion study and EF  of 57%.   PAST MEDICAL HISTORY:  1. Coronary artery disease with superimposed coronary artery spasm, stable      since recent hospitalization.  2. History of Prinzmetal angina.  3. Nonobstructive coronary disease by  cath in 2003.  4. Non-segment ST elevation MI in 2003.  5. Mild LV dysfunction.  EF 40-50% by cath in 2003.  6. Hypertension, well controlled.  7. Dyslipidemia.  8. Dyspnea on exertion.  9. New onset palpitations.  10.Migraine headaches.  11.New onset of syncope.  EKG showed normal sinus rhythm with what looks      like a short P-R in lead I but the P-R interval on the other leads      looked okay.  There is no evidence of a delta wave, ST-T wave changes,      and V3-5 of T wave inversions are old from previous EKG's.  12.Urinary stress incontinence.  13.History of GI bleed, status post colonoscopy, showing probable ischemic      colitis of splenic flexure with a hemoglobin at discharge of 11.4 on      February 19, 2004.  14.Depression.  15.GERD with hiatal hernia.  16.Normal stress Cardiolite with no evidence of infarction or ischemia.      Normal wall motion study with EF of 57% on December 09, 2005.  17.Essentially normal 48-hour Holter monitor report with average heart      rate of 86 beats per minute.  Normal sinus rhythm was the basic      underlying rhythm with rare isolated PVC's in April, 2005.   ALLERGIES:  SULFA.   CURRENT MEDICATIONS:  1. Detrol LA 4 mg daily.  2. Aspirin 325 mg daily.  3. Effexor XR 150 mg daily.  4. Nexium 40 mg daily.  5. Imdur 120 mg daily.  6. Sublingual nitroglycerin 0.4 mg as needed.  7. Coreg 3.125 mg twice daily.  8. Caduet 10/20 mg daily.  9. Zetia 10 mg 1 tablet daily.  10.Potassium chloride 10 mEq 1 tablet twice daily.   PAST SURGICAL HISTORY:  1. Partial hysterectomy.  2. Right total knee replacement.  3. Right shoulder surgery.   FAMILY HISTORY:  1. Father deceased, age 62, MI, CVA.  2. Mother deceased, age unknown.  MI at age 72.  3. Brother, living, age 91.  MI in 1983.  Hypertension.  4. Brother, deceased, age 18, MI.  47. Sister, living, age 51, MI, status post stent implantation x2 on this      past Monday, 07-11-2023 2007. 6. Sister, deceased, age 16, MI.  13. Son, age 42, healthy.  8. Son, age 22, hypertension.  9. Daughter, age 27, healthy.  10.Son, age 67,  healthy.   SOCIAL HISTORY:  Married.  Lives with her husband.  Has four adult children.  The patient retired on disability, after her knee replacement, from [employer].  She denies tobacco, alcohol, or illicit drug use.  She also denies a  consistent exercise regimen.   REVIEW OF SYSTEMS:  All other systems are reviewed are negative other than  what is stated in the HPI.   PHYSICAL EXAMINATION:  GENERAL:  A 65 year old female, pleasant and  cooperative, NAD.  VITAL SIGNS:  Blood pressure 130/82, pulse 80 and regular.  Weight 168  pounds.  Height 5 feet 6 inches.  HEENT:  Unremarkable.  NECK:  Supple without JVD or carotid bruits bilaterally.  Carotid upstrokes  2+.  PULMONARY:  Breath sounds are clear and equal to auscultation bilaterally.  No use of accessory muscles.  CARDIOVASCULAR:  Regular rate and rhythm.  Normal S1 and S2.  No murmurs,  gallops, rubs or clicks auscultated.  ABDOMEN:  Soft, nontender, nondistended with active bowel sounds.  No  masses, hepatomegaly, or bilateral bruits.  EXTREMITIES:  No peripheral edema.  DP/PT pulses +2/2.  SKIN:  Warm and dry without rashes or lesions.  NEURO:  No focal motor or sensory deficits.  PSYCH:  Normal mood and affect.  BACK:  No kyphosis or scoliosis.   LABORATORY DATA:  EKG on July 09, 2006:  Normal sinus rhythm with  occasional PVC's with a ventricular rate of 71 beats per minute.  Nonspecific ST-T wave abnormalities.  No evidence of ischemia.  This is  consistent with the prior EKG dated December 01, 2005.   ASSESSMENT:  1. Chest pain/discomfort.  2. Coronary artery disease with superimposed coronary artery spasm with      nonobstructive coronary arteries via cardiac catheterization, May 31, 2002.  3. Angina pectoris.   PLAN:  1. Directly admit patient to [facility] to a telemetry unit under      the service of Dr. Verdis Prime under the diagnosis of unstable angina      and to rule out MI.  2. Obtain cardiac panel, including troponin I q.8h. x3 with the first set      to be drawn now.  3. Obtain CMET, CBC with diff, PT/PTT, INR, EKG, and magnesium upon      arrival.  4. Obtain FLP in the a.m.  5. Hold anticoagulation secondary to history of GI bleed unless enzymes      are positive.  6. Check BMET, CBC, and EKG daily.  7. Start D5W/0.45 sodium chloride IV saline lock.  8. Continue home medications except with the following parameters around      Coreg 3.125 mg twice daily, hold for systolic blood pressure less than      100 mmHg or heart rate less than or equal to 60.  Potassium chloride 10      mEq 1 tablet twice daily.  9. Start IV nitroglycerin drip at 10 mcg per minute and titrate by 5-10      mcg per minute per chest pain relief, keeping systolic blood pressure     greater than or equal to 100 mmHg.  10.Morphine 2-4 mg IV q.1-2h. as needed for chest pain.  11.Acetaminophen 650 mg p.o. every 3-4 hours as needed for mild pain or      headache.  12.Start oxygen 2 liters per minute via nasal cannula as needed.  13.Diet:  A 4 gm sodium, fat modified  diet.  14.If enzymes are positive, the patient will be scheduled for a cardiac      catheterization on Monday with Dr. Katrinka Blazing.   Dr. Mayford Knife was the supervising physician present in the [facility]  office during the patient's evaluation.  The patient was seen, interviewed,  and examined by Dr. Mayford Knife, who participated in the medical decision making  and plan of care.      Tylene Fantasia, [place]      Armanda Magic, M.D.  Electronically Signed    RDM/MEDQ  D:  07/09/2006  T:  07/09/2006  Job:  [id number]   cc:   Oley Balm. Georgina Pillion, M.D.

## 2011-03-13 NOTE — Discharge Summary (Signed)
[name], [name]                ACCOUNT NO.:  [id number]   MEDICAL RECORD NO.:  [id number]          PATIENT TYPE:  INP   LOCATION:  4730                         FACILITY:  [facility]   PHYSICIAN:  Corinna L. Lendell Caprice, MDDATE OF BIRTH:  [birth year]   DATE OF ADMISSION:  04/21/2008  DATE OF DISCHARGE:  04/24/2008                               DISCHARGE SUMMARY   DISCHARGE DIAGNOSES:  1. Chest pain, suspect Prinzmetals angina.  2. Escherichia coli urinary tract infection.  3. Hypertension.  4. Hyperlipidemia.  5. History of migraines.  6. History of ischemic colitis and gastrointestinal bleed.  7. History of depression.  8. Gastroesophageal reflux disease.   DISCHARGE MEDICATIONS:  1. Continue aspirin 325 mg a day.  2. Detrol LA 4 mg nightly.  3. Effexor XR 150 mg daily.  4. Nexium 40 mg a day.  5. Coreg 1-5 mg twice a day.  6. Imdur 120 mg a day.  7. Nitroglycerin as needed for chest pain.  8. Pyridium and Cipro until go on.   FOLLOW UP:  Lyn Records, MD.   CONDITION:  Stable.   ACTIVITY:  No heavy exertion for 3 days.  No driving for 3 days.   CONSULTATIONS:  Armanda Magic, MD.   PROCEDURE:  Cardiac catheterization by Dr. Donato Schultz, on April 23, 2008, which showed diffuse 20% ostial lesion in the LAD, and 50% mid  lesion in the LAD, 20% proximal lesion in the RCA with normal ejection  fraction.   DIET:  Should be low-salt, low-cholesterol.   LABORATORY DATA:  CBC unremarkable.  Basic metabolic panel unremarkable.  Liver function tests unremarkable.  Urinalysis showed large hemoglobin,  15 ketones, 100-protein, positive nitrite, moderate leukocyte esterase,  7-10 white cells, 21-50 red cells, many bacteria.  Urine culture grew  out E-coli which was sensitive to everything but ampicillin.  Cardiac  enzymes negative.  EKG showed normal sinus rhythm and nonspecific  changes which are unchanged from previous.   HISTORY AND HOSPITAL COURSE:  Ms. Meadow is a 65 year old  black female  who presented with chest pain.  Please see H&P for details.  She had a 3  weeks of worsening intermittent chest pain.  She was requiring more  sublingual nitroglycerin.  She had a catheterization in 2007, which  showed some coronary artery disease, but mainly vasospasm which  responded well to intracoronary nitroglycerin.  She has a history of MI  from vasospasm.  She had normal vital signs and was slightly anxious.  She is found to have an urinary tract infection and started on Cipro.  Cardiology was consulted and performed catheterization, Dr. Katrinka Blazing felt  that her cardiac catheterization was essentially the same as from 2007  and recommended continuing her same outpatient medications.  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  I encouraged  her to follow up with him.      Corinna L. Lendell Caprice, MD  Electronically Signed     CLS/MEDQ  D:  04/30/2008  T:  05/01/2008  Job:  [id number]   cc:   Lyn Records, M.D.  Oley Balm Georgina Pillion, M.D.

## 2011-03-13 NOTE — H&P (Signed)
[place]. [facility]  Patient:    [name], [name]                         MRN: [id number] Adm. Date:  02/22/01 Attending:  Claude Manges. Cleophas Dunker, M.D. Dictator:   Jamelle Rushing, P.A.                         History and Physical  DATE OF BIRTH: Jun 24, 1946  CHIEF COMPLAINT: Right knee pain.  HISTORY OF PRESENT ILLNESS: The patient is a 65 year old black female with a history of longtime off and on right knee pain.  The patient states that her right knee pain did improve significantly two to three years prior to the last 1-1/2 year, when her knee pain became constant and has progressively worsened. The patient describes the pain as a sharp stabbing pain that also has some dull qualities to it and a burning quality.  She states it is constant, worse with any ambulation, initiation of standing from sitting, or when she is kneeling down.  The pain does not radiate.  She does have increased swelling in her knee all the way down to her foot.  She does have grinding, locking sensation, a sensation of swelling in the lower extremity, and she does have pain at night.  The patient currently is using a cane.  The patient has noted a deformity of her leg.  She has not had any improvement with injections in the past.  ALLERGIES: SULFA.  CURRENT MEDICATIONS:  1. Lipitor 10 mg p.o. q.d.  2. Detrol LEFT ARM 4 mg p.o. q.d.  3. Premarin 0.625 mg q.d.  4. Nexium 40 mg p.o. q.d.  5. Nifedipine ER 60 mg p.o. q.d.  6. Effexor XR 150 mg p.o. q.d.  7. Imdur 60 mg p.o. q.d.  8. Aspirin 325 mg p.o. q.d.  9. Nitroglycerin sublingual 0.4 mg p.r.n.  PAST MEDICAL HISTORY:  1. The patient states she has a long-term history of hypertension.  She     states it is currently well controlled on her current medications.  2. The patient also has a history of hiatal hernia.  Symptoms are fairly     well controlled with Nexium.  3. She also has a history of heart disease, and in 1995  she required balloon     angioplasty after having significant chest pain and shortness of breath.     The patient states she has angina on an occasional basis, usually about     two or three times a month, and she usually just requires one sublingual     nitroglycerin to relieve this.  This has been a constant problem and there     have been no changes in this pattern.  4. The patient also has a history of urinary incontinence with increased     urgency, which has been improved slightly with Detrol LA.  Otherwise, the patient denies any history of diabetes, thyroid problems, peptic ulcers, or any respiratory problems.  PAST SURGICAL HISTORY:  1. Bladder surgery in 1987.  2. Right shoulder arthroscopy with subacromial decompression and distal     clavicle resection with an open rotator cuff repair in 2000.  The patient denies any complications with any of the above-mentioned surgical procedures.  SOCIAL HISTORY: The patient is a 65 year old black female, with no history of smoking or alcohol use.  The patient is currently married,  with three children.  She lives with her husband in a one-story house.  The patient is currently employed in housekeeping.  FAMILY PHYSICIAN: Financial controller.  CARDIOLOGIST: Darci Needle, M.D. [phone]).  FAMILY HISTORY: Mother is deceased at 49 years of age from heart disease. Father is deceased in his 4s from a stroke.  The patient has two brothers who are deceased, one from seizure disorder and the other from an MI at 62 years of age.  One sister is deceased at 53 years of age from MI.  The patient has two brothers who are alive, one with a significant smoking and drinking history and has had an MI and prostate cancer, and the other in fairly good health.  The patient has five sisters, one of whom he has significant cardiac history and diabetes, the others in good health.  REVIEW OF SYSTEMS: Positive for occasional chest pain.   The patient describes it as a tightness or shoulder and arm discomfort.  The patients last episode was last Monday.  She takes one sublingual nitroglycerin, which totally alleviates her problems.  The patient has been to her cardiologists office, Dr. Katrinka Blazing, since Monday and has been cleared for surgery.  The patient does have upper and lower dentures.  She does use glasses at all times.  The patient does report shortness of breath at rest, lying down, and with exertion.  She feels that over the last several months to the last year it has, in fact, improved.  Where in the past she used to require two to three pillows in order to sleep she is presently now able to use just one.  She currently does have occasionally problems with reflux and nausea, but it has been improved since she has been on Nexium.  The patient does have some increased urinary urgency and frequency and incontinence, and that also is improved with the Detrol LA.  Otherwise, the Review Of Systems is negative and noncontributory for any other general, sensory, respiratory, cardiac, GI, GU, hematologic, musculoskeletal, neurologic, or mental status problems that were not mentioned above.  PHYSICAL EXAMINATION:  VITAL SIGNS: Height 5 feet 6 inches.  Weight 160 pounds.  Blood pressure 152/88, pulse 76, respirations 12, temperature 99.0 degrees.  GENERAL: The patient is a healthy-appearing adult black female.  She ambulates with a cane in her right hand.  She does walk with a significant valgus deformity of her right leg.  She does walk with a limp.  She is able to get on and off the examining table without too much difficulty or obvious distress, and she holds a very easy and pleasant conversation.  HEENT: Head normocephalic, atraumatic.  Nontender over maxillary or frontal sinuses.  PERRLA.  EOMI.  Sclerae nonicteric.  Conjunctivae pink and moist. External ears are without deformities.  Canals are patent.  TMs pearly  gray and intact.  Gross hearing is intact.  Nasal septum midline.  Mucous membranes pink and moist.  No polyps noted.  Oral buccal mucosa is pink and moist and  without lesions.  Upper and lower dentures in place.  Uvula midline and moves symmetrically with phonation.  The patient was able to swallow without any difficulty.  NECK: Supple.  No palpable lymphadenopathy.  Thyroid gland nontender.  The patient had excellent range of motion of her cervical spine without any difficulty or tenderness.  CHEST: Lung sounds were clear and equal bilaterally.  No wheezes, rales, rhonchi, or rubs noted.  HEART: Regular rate and rhythm  with S1 and S2 was auscultated and a 2/6 systolic murmur was noted along the left sternal border.  No rubs noted.  ABDOMEN: Flat, nontender.  Bowel sounds were normoactive throughout.  No palpable hepatosplenomegaly.  CVA was nontender.  EXTREMITIES: Upper extremities were symmetric in size and shape, with excellent range of motion of her shoulders, elbows, and wrists without any deficits, with 5/5 motor strength in all muscle groups tested.  Lower extremities showed right and left hip were nontender to examination.  She had full extension and flexion to 130 degrees, with 20-30 degrees internal and external rotation without any mechanical symptoms or pain.  The left knee had no obvious deformities.  Skin was intact.  No palpable effusion.  She had full extension and flexion back to 130 degrees.  There was no valgus/varus laxity and no anterior or posterior drawer.  She was nontender along the joint line, and no effusion was present.  Right knee patient weightbearing had 15 degree valgus deformity.  There was no sign of erythema or ecchymosis about the knee. No palpable effusion.  The patient had a moderate amount of crepitus under the patella with range of motion, which was limited from 10 to 95 degrees.  The patient had about 10-15 degree valgus/varus laxity with  no anterior or posterior drawer.  Bilateral calves were nontender.  Bilateral ankles were swollen in appearance but she had good dorsiflexion and plantar flexion.  Peripheral vasculature showed carotid pulses were 2+, no bruits; radial pulses 2+; femoral pulses 2+; dorsalis pedis pulses 1+, as were the posterior tibial pulses.  The patient had no lower extremity venous stasis changes but she did have a significant amount of edema from the lower one-third of her tibia down to just below her ankles.  NEUROLOGIC: The patient was conscious, alert, and appropriate, and had an easy conversation with the examiner.  Cranial nerves 2-12 were grossly intact. Deep tendon reflexes of the upper and lower extremities were brisk and intact right to left.  The patient was grossly intact to light touch sensation from head to toe.  BREAST/RECTAL/GU: Examinations deferred at this time.  IMPRESSION:  1. End-stage osteoarthritis, right knee, with valgus deformity.  2. Hypertension.  3. Hiatal hernia.  4. Coronary artery disease.  5. Urinary incontinence.  PLAN: The patient will be admitted to [facility] on February 22, 2001 under the care of Dr. Norlene Campbell.  The patient has donated one unit of autologous blood for this procedure.  The patient will undergo all routine laboratories and tests prior to having a right total knee arthroplasty.  The patient has been cleared by Dr. Verdis Prime, cardiologist, for this surgical procedure. DD:  02/16/01 TD:  02/17/01 Job: [id number]

## 2011-03-13 NOTE — H&P (Signed)
[place]ES[place], SAN                          ACCOUNT NO.:  [id number]   MEDICAL RECORD NO.:  [id number]                   PATIENT TYPE:  INP   LOCATION:  2027                                 FACILITY:  [facility]   PHYSICIAN:  Lyn Records, M.D.                DATE OF BIRTH:  July 16, 1946   DATE OF ADMISSION:  05/30/2002  DATE OF DISCHARGE:  06/05/2002                                HISTORY & PHYSICAL   CHIEF COMPLAINT:  Chest pain/fatigue.   IMPRESSION:  (as dictated by Dr. Armanda Magic)  1. Unstable angina with new lateral T-wave abnormalities and deeper T-wave     inversions V2 and V3 consistent with possible ischemia.  First set of     CK/MB and troponin-I are negative.  2. Hypertension, stable.  3. History of coronary artery disease.  4. History of elevated lipids.   PLAN:  (as dictated by Dr. Armanda Magic)  1. Admit to CCU/PCU.  2. Rule out MI protocol with serial cardiac enzymes and daily EKG.  3. IV heparin drip/IV nitro drip.  Titrate to pain free with SVT greater     than 100.  4. No beta blocker secondary to heart rate in the 60s with transient     decrease to 40s when she had chest pain.  5. Aspirin/Plavix/p.r.n. morphine.  6. Add Integrelin IV if not pain-free after increasing nitroglycerin drip.  7. Check fasting lipid profile.  8. Anticipate cardiac catheterization 05/31/2002, by Dr. Verdis Prime or     sooner if enzymes bump positive or if unstable symptoms.  9. Hold Nifedipine.  10.      GI cocktail.   HISTORY OF PRESENT ILLNESS:  The patient is a 65 year old black female with  history of CAD, hypertension, GERD, admitted with chest pain and fatigue.  She has a history of intermittent chest discomfort usually when awakening,  somewhat relieved with sublingual nitroglycerin.  She recently has had  increase in her anginal symptoms.   This morning she awoke with severe 10/10 chest pain with radiation to the  left arm with associated nausea and severe fatigue.   The symptoms improved  with sublingual nitrate but then increased in severity.  It seems similar in  presentation to prior anginal symptoms.   PAST MEDICAL HISTORY:  1. Hypertension.  2. Hiatal hernia controlled on Nexium.  3. CAD, status post PTCI, question which vessel back in 1998.  4. Urinary incontinence.  5. Dyslipidemia; has been on Lipitor in the past but this was discontinued.   PAST SURGICAL HISTORY:  Status post bladder surgery in 1987.  Right shoulder  arthroscopic surgery and rotator cuff repair in 2000.   SOCIAL HISTORY/HABITS:  ETOH/ tobacco:  Negative.  She is married with three  children.  Lives with her husband.  She works as a Advertising copywriter.   FAMILY HISTORY:  Mother died at age 37 of CAD.  Father  died age 72.  She had  two brothers, one deceased with history of seizure disorder and one died at  age 41, question MI.  Sister died at age 45 of a myocardial infarction.  Two  brothers alive, one with history of heart attacks.  Five sisters, all  living.   ALLERGIES:  SULFA.   MEDICATIONS:  1. Premarin 0.625 mg p.o. q.d.  2. Nifedipine 60 mg p.o. q.d.  3. Nexium 40 mg p.o. q.d.  4. Imdur 60 mg p.o. q.d.  5. Detrol LA 4 mg per day.  6. Effexor 150 mg p.o. q.d.   REVIEW OF SYMPTOMS:  As per HPI/history and physical; otherwise, reviewed  and were negative.   PHYSICAL EXAMINATION:  (as performed by Dr. Armanda Magic)  VITAL SIGNS:  Blood pressure 121/78, heart rate 68 and regular.  She is  afebrile.  GENERAL:  She is a well-nourished black female in mild distress secondary to  chest discomfort.  HEENT:  There is bilateral carotid upstroke without bruit heard.  NECK:  No JVD.  No thyromegaly.  CHEST:  Lung sounds clear with equal bilateral excursion.  Negative CPA  tenderness.  CARDIAC:  Regular rate and rhythm without murmur, rub, or gallop.  Normal S1  and S2.  ABDOMEN:  Soft, nondistended, normal active bowel sounds.  Negative  abdominal aorta, no femoral  bruit.  No tension to applied pressure.  No  masses nor organomegaly appreciated.  EXTREMITIES:  Distal pulses intact. Negative pedal edema.   LABORATORY DATA:  EKG revealed NSR with T-wave inversion in V2 and V3 and  lateral nonspecific ST abnormalities.   Sodium 138, K 2.9, chloride 104, CO2 28, BUN 9, creatinine 0.9, glucose 103.  Hemoglobin 12.3, hematocrit 37.4, WBC 4.4, platelets 224.  LFTs within  normal range.  CK 83, MB fraction 0.5, troponin-I 0.02.  INR 1.0.     Salomon Fick, N.P.                       Lyn Records, M.D.    MES/MEDQ  D:  06/05/2002  T:  06/08/2002  Job:  [id number]   cc:   Claude Manges. Cleophas Dunker, M.D.

## 2011-03-13 NOTE — H&P (Signed)
NAME:  [name], [name]                          ACCOUNT NO.:  [id number]   MEDICAL RECORD NO.:  [id number]                   PATIENT TYPE:  INP   LOCATION:  1827                                 FACILITY:  [facility]   PHYSICIAN:  Melissa L. Ladona Ridgel, MD               DATE OF BIRTH:  14-Sep-1946   DATE OF ADMISSION:  02/19/2004  DATE OF DISCHARGE:                                HISTORY & PHYSICAL   PRIMARY CARE PHYSICIAN:  Dr. Onalee Hua B. Massey.   CHIEF COMPLAINT:  Blood per rectum.   HISTORY OF PRESENT ILLNESS:  The patient is a 65 year old African American  female with a past medical history significant for constipation.  She states  that she took a laxative the other day, which worked very well, causing  intense abdominal cramping and movement of her stool, but she noticed at  that time a small amount of blood.  After a large bowel movement, she then  started having smaller, more frequent movements that were consisting of  bright red blood per rectum and some clots.  She states she had at least 4-5  of those yesterday and then here in the emergency room, another 4-5 episodes  which were mainly clot.  She said that each episode decreased in its  intensity of redness, until at this time she has had no further events.   REVIEW OF SYSTEMS:  Her review of systems is significant for minor weight  gain, some chest pain which seldom comes on and is consistent with her  angina, positive abdominal pain, no dysuria, no hematuria, positive bright  red blood per rectum, positive history of headaches.  All other review of  systems are negative.   PAST MEDICAL HISTORY:  She does have heart problems.  In reviewing the  records, it appears she had an inferior wall MI in 2003 with an ejection  fraction of 45% to 55%.  She carries a diagnosis of GERD with hiatal hernia,  headaches that are chronic in nature, hypertension, dyslipidemia, and she  had colonoscopy about 1-2 years ago but she does not recall with  who that  colonoscopy was; she remember that it was with an [place] physician.  She  states that many years ago in the past when she was with family practice,  she was seen by Dr. Lina Sar, but has since seen an [facility] physician for  her GI needs.   PAST SURGICAL HISTORY:  She has had knee replacement, shoulder surgery and  it appears that she has had a toe operated on as well as a possible bypass  surgery in the left leg.   SOCIAL HISTORY:  She does not drink, she does not smoke, she does not have  any history of IV drug abuse.   FAMILY HISTORY:  Her mom is deceased, had a history of diabetes.  Dad is  deceased with a history of stroke.  MEDICATIONS:  1. Vitamin E.  2. Potassium 10 mEq daily.  3. Isosorbide mononitrate ER 120 mg daily.  4. Detrol LA 4 mg daily.  5. Effexor XR 150 mg daily.  6. Coreg 3.125 mg b.i.d.  7. Aspirin 325 mg daily.  8. Vitamin C.  9. Darvocet-N 100 q.4-6 h. p.r.n. for pain.  10.      Lipitor 20 mg daily.  11.      Aloe vera gel.  12.      Norvasc 10 mg daily.  13.      Bee pollen.  14.      Black cohosh.   PHYSICAL EXAMINATION:  VITAL SIGNS:  Her vital signs are temperature of  98.3, blood pressure 129/81, pulse 91-104, respiratory rate is 18,  saturation is 100%.  GENERAL:  She is in no acute distress.  HEENT:  She is normocephalic, atraumatic.  Pupils are equal, round, reactive  to light.  Extraocular muscles are intact.  Mucous membranes are moist.  NECK:  Her neck is supple.  There is no JVD, no lymph nodes, no bruits, no  thyromegaly.  CHEST:  Her chest is clear to auscultation.  No rhonchi, rales or wheezes  are present.  CARDIOVASCULAR:  Regular rate and rhythm.  Positive S1 and S2.  No S3 or S4.  No murmurs, rubs, or gallops.  ABDOMEN:  Abdomen is soft with mild tenderness over the suprapubic area.  RECTAL:  A rectal is performed by the emergency room and is found to be  grossly heme-positive and maroon in color.  EXTREMITIES:  There  is no edema, 2+ pulses.  NEUROLOGICAL:  Neurologically, cranial nerves II-XII are intact.  Power is  5/5.  Deep tendon reflexes are 2+.   LABORATORY VALUES:  Laboratory values reveal a hemoglobin of 15.3,  hematocrit of 45.0.  Her sodium is 139, potassium is 3.6, chloride is 104,  CO2 is 25.7 with a BUN of 11 and creatinine of 1.0, glucose of 121.  Amylase  is 83, lipase is 25.   ASSESSMENT AND PLAN:  This is a 65 year old African American female with new-  onset bright red blood per rectum.  We will:  1. Gastrointestinal:  Keep her n.p.o. and have [facility] see     her in the morning.  We will continue to monitor her hemoglobin and     hematocrit every 8 hours and transfuse her for hematocrit less than 25.     We will start her on Protonix 40 mg p.o. daily.  2. Genitourinary:  She has a history of urinary incontinence.  We will     continue her Detrol LA.  3. Cardiovascular:  History of myocardial infarction with decreased ejection     fraction.  We will check an EKG and continue her medications as she     appears compensated.  4. Endocrine:  There are no current issues.  5. Pulmonary:  There are also no current issues.  We will leave her on     telemetry for further monitoring.                                                Melissa L. Ladona Ridgel, MD    MLT/MEDQ  D:  02/20/2004  T:  02/20/2004  Job:  [id number]   cc:   Oley Balm. Georgina Pillion, M.D.  [address]  [place]  [place] [address]  Fax: [phone]

## 2011-03-13 NOTE — Discharge Summary (Signed)
[name], [name]                          ACCOUNT NO.:  [id number]   MEDICAL RECORD NO.:  [id number]                   PATIENT TYPE:  INP   LOCATION:  2027                                 FACILITY:  [facility]   PHYSICIAN:  Lyn Records, M.D.                DATE OF BIRTH:  01/28/1946   DATE OF ADMISSION:  05/30/2002  DATE OF DISCHARGE:  06/05/2002                                 DISCHARGE SUMMARY   PRIMARY CARE PHYSICIAN:  [facility] [place]).   DISCHARGE DIAGNOSES:  1. Inferior myocardial infarction secondary to diffuse coronary artery     spasm.     A. Peak CK of 976, initiated on Norvasc, MB fraction 116.1, troponin I        27.45.  2. Hypokalemia.  The admission serum potassium of 2.9.  This was     supplemented and the serum potassium at the time of discharge was of 4.1.  3. Thrombocytopenia, question related to heparin induced.  Admission     platelets of 224; as low as 123.  Platelets of 209 at the time of     discharge.  4. Diffuse coronary artery spasm; question Prinzmetal's.     A. Cardiac catheterization revealing initial diffuse left coronary artery        and right coronary artery spasm subsequently relieved with 100 mcg        intracoronary nitroglycerin into both vessels.  5. History of hypertension under good control on current medical regimen.  6. History of dyslipidemia on Lipitor in the past.  7. History of gastrointestinal reflux disease.  8. History of bladder incontinence on Detrol.   DISPOSITION:  The patient is discharged home in stable condition.   DISCHARGE MEDICATIONS:  1. (New) Plavix 75 mg one p.o. q.d.  2. Ecotrin 325 mg once daily.  3. (Increased) Imdur 60 mg two tablets p.o. q.d.  4. Detrol LA 4 mg once daily.  5. (New) Norvasc 10 mg once daily.  6. Effexor XR 150 mg p.o. q.d.  7. Nexium 40 mg p.o. q.d.  8. Nitroglycerin tablet 0.4 mg sublingual p.r.n. chest pain.   ACTIVITY:  As tolerated.   DIET:  Low salt, low  fat.   SPECIAL INSTRUCTIONS:  1. Stop Premarin and stop Procardia.  2. Once you run out of your Imdur 60 mg, which you take two a day, change to     Imdur 120 mg once daily.  3. Question add ace inhibitor in the future.  4. Phase 2 cardiac rehabilitation enrollment.   FOLLOW-UP:  Lyn Records, M.D.; appointment arranged to be seen within one  to two weeks.   HISTORY OF PRESENT ILLNESS:  The patient is a very pleasant 65 year old  female noted to have symptoms of unstable angina with new P wave  abnormalities and deeper T-wave inversions in V2 and V3 consistent with  possible ischemia.  She  was admitted to telemetry.   HOSPITAL COURSE:  The patient was placed on IV heparin and IV nitroglycerin  drip with the addition of Plavix, aspirin, and p.r.n. morphine.  Nifedipine  was placed on hold.  Beta blockers were held secondary to heart rate in the  60s and transient decreased heart rate in the 40s when having chest pain.  Her second set of enzymes did track upward with eventual peak CK of 976, MB  fraction 116.1, and troponin I 27.45.  On May 31, 2002, the patient was  taken for coronary angiography with the following results:   1. Left ventriculogram:  Inferior hypokinesis with an EF of 45-55%.  2. Coronary angiography:  Initial diffuse left coronary artery and right     coronary artery spasm relieved with 100 mcg of intracoronary     nitroglycerin into both vessels.  Subsequently the left main was okay.     The LAD was patent with a possible 40-60% focal distal lesion just beyond     the diagonal #2 (unchanged since 1998).  The CFX was okay.  RCA with     luminal irregularities noted.   It was the impression of Lyn Records, M.D., that the patient had diffuse  coronary artery spasm of the left coronary artery and right coronary artery  systems.  The right coronary artery appeared to have suffered a coronary  artery spasm-induced myocardial infarction.  The patient was started  on  Imdur with weaning of nitroglycerin drip.  She was also started on Norvasc.  She was continued on Lovenox with careful watch of the platelet count.  Over  the next few days prior to discharge, the patient did have some recurrent  twinges of chest discomfort.  Her Imdur was titrated up so that she was on  120 mg p.o. q.d.  Norvasc was added to her regimen with discontinuation of  Procardia.  She was discharged home pain-free with advice to hold stimulants  and decongestants.  Her activity was advanced and it is anticipated that she  will started cardiac rehabilitation phase 2 as an outpatient.   LABORATORY TESTS AND DATA:  WBC 4.4, hemoglobin 12.3, hematocrit 37.4.  Admission platelets 224; as low as 123 and 209 at the time of discharge.  Admission coagulation studies were all within normal range.  Admission  sodium 139, K 2.9, chloride 104, CO2 28, glucose 103, BUN 9, creatinine 0.9.  LFTs all within normal range, though albumin slightly decreased at 3.4.  At  the time of discharge, the potassium was 4.1.  First CK of 83, MB fraction  0.5, and troponin I 0.02.  Second CK of 152, MB fraction 18.8, and troponin  I 0.21. Third CK of 692 and MB fraction 142.2.  Fourth CK of 976, MB  fraction 116.1, and troponin I 27.45.  Cholesterol 230, triglycerides 56,  HDL 53, LDL 166.   The admission chest x-ray revealed increasing bibasilar subsegmental  atelectasis.  The admission EKG revealed NSR with T-wave inversion in V2-V3  and lateral nonspecific ST abnormalities.   PAST MEDICAL HISTORY:  1. Coronary atherosclerotic heart disease.     A. In 1995, PTCA (questionable vessel).  2. Hypertension.  3. Hiatal hernia.  4. Bladder surgery.  5. Urinary incontinence.  6. Dyslipidemia on Lipitor in the past, but discontinued.  7. Peripheral vascular disease, status post left second toe amputation and    left popliteal dorsalis pedis bypass grafting in April of 2002.   PAST SURGICAL  HISTORY:  1.  Bladder surgery in 1987.  2. Right shoulder rotator cuff repair in 2000.  3. Colonoscopy in January of 2003 by Barrie Folk, M.D.  This revealed     internal hemorrhoids.  4. Left popliteal dorsalis pedis bypass surgery with reverse saphenous vein     graft and amputation of left second toe secondary to gangrene and     peripheral vascular disease.     Salomon Fick, N.P.                       Lyn Records, M.D.    MES/MEDQ  D:  06/05/2002  T:  06/09/2002  Job:  [id number]   cc:   [facility] [place])

## 2011-03-13 NOTE — Op Note (Signed)
. [facility]  Patient:    [name], [name]                       MRN: [id number] Proc. Date: 02/22/01 Adm. Date:  [id number] Attending:  Randolm Idol                           Operative Report  PREOPERATIVE DIAGNOSIS:  End-stage osteoarthritis, right knee.  POSTOPERATIVE DIAGNOSIS:  End-stage osteoarthritis, right knee.  OPERATION PERFORMED:  Right total knee replacement.  SURGEON:  Claude Manges. Cleophas Dunker, M.D.  ASSISTANT:  Jamelle Rushing, P.A.  ANESTHESIA:  General orotracheal.  COMPLICATIONS:  None.  COMPONENTS:  Depuy LCS complete pore coated standard femoral component. A #3 metallic tibial tray secured with polymethyl methacrylate with a 12.5 mm bridging bearing or insert and a cruciate design metal backed patella secured with polymethyl methacrylate.  DESCRIPTION OF PROCEDURE:  With the patient comfortable on the operating table and under general orotracheal anesthesia, a Foley catheter was inserted by the nursing staff. The right lower extremity was then placed in a thigh tourniquet.  The leg was then prepped with Betadine scrub and DuraPrep from the tourniquet to the midfoot. Sterile draping was performed. With the extremity still elevated, it was Esmarch exsanguinated with the proximal tourniquet at 350 mmHg.  A midline longitudinal incision was made centered over the patella and by sharp dissection carried down to the subcutaneous tissues.  The first layer of capsule was incised in the midline.  The deep capsule was incised in the medial parapatellar fashion using the Bovie.  Upon entering the joint there was approximately 15 cc of clear yellow joint effusion.  The patella was everted 180 degrees and the knee flexed to 90 degrees.  There was considerable chondromalacia involving the central portion of the patella. There was complete absence of articular cartilage along the lateral femoral condyle and large areas of articular  cartilage loss on the medial femoral condyle with spurs medially and laterally on the femoral condyles.  The knee was at neutral and therefore a lateral release was not necessary. Preoperatively, we had templated a standard femoral and standard or standard plus tibial component.  We confirmed the standard femoral component intraoperatively with a #3 tibial tray.  The appropriate jigs were applied to the right lower extremity to obtain the appropriate femoral and tibial cuts. The ACL and PCL were sacrificed. The MCL and LCL remained intact throughout the procedure.  The 4 degree distal valgus femoral cut was utilized.  A 12.5 mm flexion extension gap was symmetrical.  Retractors were placed behind the tibia and then the lamina spreader was inserted.  Remnants of medial and lateral menisci were removed as well as those of the stumps of the PCL and ACL.  A Bovie was used to obtain hemostasis and prevent postoperative blood loss.  The center hole was made in the tibia with a jig.  The trial components were then inserted.  Initially, the tibial tray was impacted followed by the 10 and then the 12.5 mm bridging bearing.  We had much better stability with a 12.5 mm bridging bearing, little if any opening with varus and valgus stress. What little opening we had was symmetrical and it was full in slight hyperextension.  The standard femoral component had an excellent fit and therefore, we elected to press-fit the femoral component.  The patella was prepared by removing 8 mm  of bone leaving 13 mm of patella. The cruciate jig was applied so that we could obtain the cruciate position of the patella with the bur.  The final component without methacrylate was impacted and placed through a full range of motion.  There was slight lateral position, so we elected to perform a lateral release.  All the trial components were removed.  The joint was then copiously irrigated with jet saline and antibiotic  solution.  Polymethyl methacrylate was used to secure the tibial tray, a 12.5 mm bridging bearing was applied, then followed by the press-fit femur.  The patella was applied with methacrylate and secured with a patellar clamp until the methacrylate had matured.  There were a few areas of extraneous methacrylate about the tibia which were removed with an osteotome.  The joint was again irrigated with jet saline antibiotic solution.  The knee was then placed through a full range of motion with excellent position of all the components.  The tourniquet was deflated, bleeders were Bovie coagulated.  A Hemovac was inserted and the deep capsule was closed with #1 Tycron.  The first layer of capsule was closed with running 0 Vicryl, the subcutaneous with 2-0 Vicryl and the skin closed with skin clips.  Sterile bulky dressing was applied followed by an Ace bandage and a knee immobilizer.  The patient tolerated the procedure without complications. DD:  02/22/01 TD:  02/22/01 Job: [id number]

## 2011-03-13 NOTE — Procedures (Signed)
[place]  Patient:    [name], [name] Visit Number: [id number] MRN: [id number]          Service Type: END Location: ENDO Attending Physician:  Louie Bun Dictated by:   Everardo All Madilyn Fireman, M.D. Proc. Date: 11/24/01 Admit Date:  11/24/2001 Discharge Date: 11/24/2001   CC:         Onalee Hua B. Georgina Pillion, M.D.                           Procedure Report  PROCEDURE PERFORMED:  Colonoscopy.  ENDOSCOPIST:  Everardo All. Madilyn Fireman, M.D.  INDICATIONS FOR PROCEDURE:  Occasional rectal bleeding in a 65 year old patient with no prior colon screening.  DESCRIPTION OF PROCEDURE:  The patient was placed in the left lateral decubitus position and placed on the pulse monitor with continuous low flow oxygen delivered by nasal cannula.  He was sedated with 60 mg IV Demerol and 6 mg IV Versed.  The Olympus video colonoscope was inserted into the rectum and advanced to the cecum, confirmed by transillumination of McBurneys point and visualization of the ileocecal valve and appendiceal orifice.  The prep was good.  The cecum, ascending, transverse, descending and sigmoid colon all appeared normal, no masses, polyps, diverticula or other mucosal abnormalities.  Within the proximal and midrectum, there were some small areas of punctate pale round spots no more than 2 mm in diameter with some surrounding erythema.  The significance of this was unclear, felt possibly related to bowel prep but given her rectal bleeding, it was decided to obtain some biopsies to rule out a true proctitis.  The distal rectum showed some hyperemia with prominent vasculature but no more of the small punctate lesions mentioned above.  There were also small internal hemorrhoids with no obvious thrombosis or other stigma of hemorrhage.  The scope was withdrawn and the patient returned to the recovery room in stable condition.  The patient tolerated the procedure well and there were no immediate  complications.  IMPRESSION: 1. Internal hemorrhoids. 2. Questionable mild proctitis.  PLAN: Await biopsy results and otherwise will treat hemorrhoids symptomatically and for future colon screening will probably recommend flexible sigmoidoscopy in five years. Dictated by:   Everardo All Madilyn Fireman, M.D. Attending Physician:  Louie Bun DD:  11/24/01 TD:  11/24/01 Job: [id number]

## 2011-03-13 NOTE — Discharge Summary (Signed)
[place]  Patient:    [name], [name]                       MRN: [id number] Adm. Date:  [id number] Disc. Date: [id number] Attending:  Randolm Idol Dictator:   Jamelle Rushing, P.A.                           Discharge Summary  DATE OF BIRTH:  05/11/1946  ADMISSION DIAGNOSES: 1. End-stage osteoarthritis, right knee with valgus deformity. 2. Hypertension. 3. Coronary artery disease. 4. History of urinary incontinence.  DISCHARGE DIAGNOSES: 1. Right total knee arthroplasty. 2. Postoperative blood loss anemia. 3. Hypertension. 4. Coronary artery disease. 5. Urinary incontinence.  HISTORY OF PRESENT ILLNESS:  This is a 65 year old black female with a longtime history of off and on right knee pain.  The pain did significantly improve for two or three years prior to returning to being constant pain over the last one and a half years.  The pain is described as a constant dull ache with some sharp stabbing and burning pains with awkward movements.  The pain is both day and night and increasing with any kind of weightbearing activity and initiation to stand from sitting and kneeling.  The pain does not radiate. The patient does have swelling in the knees.  She does have grinding, walking, and night pain.  She is currently using a cane for ambulation.  ALLERGIES:  SULFA.  CURRENT MEDICATIONS: 1. Lipitor 10 mg p.o. q.d. 2. Detrol LA 4 mg p.o. q.d. 3. Premarin 0.625 mg p.o. q.d. 4. Nexium 40 mg p.o. q.d. 5. Nifedipine ER 60 mg p.o. q.d. 6. Effexor XR 150 mg p.o. q.d. 7. Imdur 60 mg p.o. q.d. 8. Aspirin 325 mg p.o. q.d. 9. Nitroglycerin sublingual 0.4 mg p.r.n.  SURGICAL PROCEDURE:  On February 22, 2001, the patient was taken to the OR by Claude Manges. Cleophas Dunker, M.D., assisted by Jamelle Rushing, P.A.  Under general anesthesia, a right total knee replacement was performed with the following components:  A Depuy LCS complete pore coated standard  femoral component, a #3 metallic tibial tray secured with polymethyl methacrylate with a 12.5 mm bridging bearing and a ______ design metal backed patella secured with polymethyl methacrylate.  The patient tolerated the procedure well.  There were no complications.  One Hemovac drain was placed during the surgery, but was inadvertently pulled out prior to leaving the OR suite.  The patient did have strong pulses in her right foot prior to leaving the OR suite.  CONSULTS:  On February 22, 2001, the following routine consults were requested: Physical therapy, occupational therapy, care management, rehabilitation, and pharmacy for routine dosing of Coumadin for DVT prophylaxis.  HOSPITAL COURSE:  On February 22, 2001, the patient was admitted to [facility] under the care of Claude Manges. Cleophas Dunker, M.D.  The patient underwent a right knee arthroplasty without any complications and was transferred to the recovery room and then to the orthopedic floor for further rehabilitation.  The patient then had a five-day postoperative course, which was only complicated by some slight postoperative blood loss anemia on postoperative day #2 with an H&H of 9.8 and 28.9.  Due to the patients increased heart rate in the 110s and the patient having some autologous blood, it was decided to type and cross and transfuse the one unit of autologous blood that the patient  had.  This was done without any difficulties.  The patient responded nicely.  Her H&H did improve and her heart rate did gradually decrease to the 90s.  The patient otherwise had no other complaints. Orthopedically the patients wound remained benign, her leg remained neuromotor and vascularly intact.  She never developed any Homans signs.  The patient progressed well, but slowly with physical therapy and due to her lack of assistance at home during the day, she needed an extra day of physical therapy on the floor, so she was given that.   Arrangements were made to be discharged to home with home health physical therapy, home health R.N. for Coumadin checks, and a CPM on postoperative day #5.  The patient was transferred to home in good health.  LABORATORY DATA:  CBC on Feb 25, 2001:  WBC 6.8, hemoglobin 10.2, hematocrit 29.9, platelets 193.  Coagulation studies on Feb 26, 2001:  The PT was 26.5 with an INR of 3.6.  On the date of discharge, the INR was not available yet, but the patients Coumadin dose on the day previously h ad been adjusted.  Routine chemistries on Feb 25, 2001:  Sodium 139, potassium 4.0, glucose 105, BUN 4, creatinine 0.7.  Routine urinalysis on admission was totally normal.  The patient received one unit of autologous blood during hospitalization.  The EKG on admission was normal sinus rhythm.  MEDICATIONS ON THE ORTHOPEDIC FLOOR:  1. Colace 100 mg p.o. b.i.d.  2. Detrol LA 4 mg p.o. q.d.  3. Premarin 0.625 mg p.o. q.d.  4. Effexor 150 mg p.o. q.d.  5. Isosorbide mononitrate 60 mg p.o. q.d.  6. Protonix 40 mg p.o. q.d.  7. Zocor 20 mg p.o. q.d.  8. Nifedipine 60 mg p.o. q.d.  9. OxyContin CR 10 mg p.o. q.12h. 10. Potassium chloride 20 mEq p.o. b.i.d. 11. Laxative and enema of choice p.r.n. 12. Reglan 10 mg p.o. q.8h. p.r.n. 13. Tylenol 640 mg p.o. q.4h. p.r.n. 14. Robaxin 500 mg p.o. q.6-8h. p.r.n. 15. Restoril 15 mg p.o. q.h.s. p.r.n. 16. Nitroglycerin 0.4 mg sublingual p.r.n. 17. Percocet one or two tablets every four to six hours p.r.n. breakthrough     pain. 18. Coumadin 5 mg p.o. q.d. was held on Feb 26, 2001.  DISCHARGE MEDICATIONS: 1. The patient is to continue routine home medications. 2. OxyContin CR 10 mg p.o. q.12h. 3. Percocet one or two tablets every four to six hours for breakthrough pain    if needed. 4. Coumadin 5 mg to be adjusted by [facility] pharmacy.  ACTIVITIES:  The patient is to maintain a 50% weightbearing on the right leg with the use of a walker until instructed  otherwise by Claude Manges. Cleophas Dunker, M.D.  No driving.  DIET:  No restrictions.   WOUND CARE:  Keep the wound clean and dry.  Check daily for infection.  For any increased redness, swelling, purulent discharge, temperature, or excessive pain, call Claude Manges. Cleophas Dunker, M.D.  SPECIAL INSTRUCTIONS: 1. Home health physical therapy and home health R.N. to be provided by    [place]. 2. CPM is to be started at 65 degrees and is to be increased daily by a    minimum of 5 degrees for at least six to eight hours.  FOLLOW-UP:  The patient is to call for a follow-up appointment with Claude Manges. Cleophas Dunker, M.D., for the following Monday or Wednesday one week from discharge.  CONDITION ON DISCHARGE TO HOME:  Listed good. DD:  02/27/01 TD:  02/28/01  Job: [id number]

## 2011-03-13 NOTE — Consult Note (Signed)
NAME:  [name], [name]                          ACCOUNT NO.:  [id number]   MEDICAL RECORD NO.:  [id number]                   PATIENT TYPE:  INP   LOCATION:  3703                                 FACILITY:   PHYSICIAN:  Petra Kuba, M.D.                 DATE OF BIRTH:  [birth year]   DATE OF CONSULTATION:  02/20/2004  DATE OF DISCHARGE:                                   CONSULTATION   HISTORY:  The patient was seen in the past by Dr. Madilyn Fireman with a colonoscopy  in 2003, who has had some chronic constipation, has not been back to Dr.  Madilyn Fireman after taking some laxatives. Had increased bright red blood per rectum  and some clots. Since she has been in the hospital, she passed some blood  yesterday morning but none since. She has some very minimal lower abdominal  discomfort but is doing better. She has had no other upper tract symptoms,  no other complaints. Dr. Nehemiah Settle called me this evening and asked me to see  the patient.   PAST MEDICAL HISTORY:  1. Heart problems.  2. Reflux.  3. Hiatal hernia.  4. Knee replacement.  5. Toe amputation.  6. Some shoulder surgery.  7. Coronary artery disease.  8. Chronic headaches.  9. Hypertension.  10.      Increased lipids.   ALLERGIES:  SULFA.   MEDICINES AT HOME:  1. Vitamin E.  2. Potassium.  3. Isosorbide.  4. Detrol.  5. Effexor.  6. Coreg.  7. Aspirin a day.  8. Vitamin C.  9. Percocet.  10.      Lipitor.  11.      Aloe Vera.  12.      Norvasc.  13.      B-palm and some other herbs.   FAMILY HISTORY:  Negative for any obvious GI problems.   SOCIAL HISTORY:  Denies tobacco or alcohol.   REVIEW OF SYSTEMS:  Negative except for above.   PHYSICAL EXAMINATION:  GENERAL:  In no acute distress, uncomfortable in the  bed.  ABDOMEN:  Soft, nontender, good bowel sounds.   LABORATORY DATA:  Hemoglobin initially was 15 and has dropped to 13, BUN  normal on admission.   ASSESSMENT:  1. Multiple medical problems.  2. Bright red  blood per rectum.  3. Chronic constipation.   PLAN:  I discussed with her proceeding with a repeat look just to double-  check versus outpatient follow-up and workup, and possibly advancing her  diet and going home sooner, but she prefers to know where the blood came  from. We talked about hemorrhoids, possible rectal ulcer or some mild  ischemic changes from her constipation as possible diagnosis, and the risks,  benefits and methods of colonoscopy were discussed, and we will proceed  tomorrow afternoon. Further workup and plans pending those findings.  Petra Kuba, M.D.    MEM/MEDQ  D:  02/20/2004  T:  02/21/2004  Job:  [id number]   cc:   Oley Balm. Georgina Pillion, M.D.  [address]  [place]  [place] [address]  Fax: [phone]   Everardo All. Madilyn Fireman, M.D.  [address]  [place]  [place] [address]  Fax: [phone]

## 2011-03-13 NOTE — Cardiovascular Report (Signed)
[name], [name]                ACCOUNT NO.:  [id number]   MEDICAL RECORD NO.:  [id number]          PATIENT TYPE:  INP   LOCATION:  3708                         FACILITY:  [facility]   PHYSICIAN:  Lyn Records, M.D.   DATE OF BIRTH:  01-07-1946   DATE OF PROCEDURE:  07/12/2006  DATE OF DISCHARGE:                              CARDIAC CATHETERIZATION   INDICATION:  Recent increasing episodes of chest discomfort responsive to  nitroglycerin.  The patient was admitted on July 09, 2006, because of  this and anterior T-wave inversions.  The study is being done to rule out  progression of coronary disease.   PROCEDURE:  1. Left heart catheterization.  2. Selective coronary angiography.  3. Left ventriculography.  4. Angio-Seal arteriotomy closure.   DESCRIPTION:  After informed consent a 6-French sheath was placed in the  right femoral artery using modified Seldinger technique.  A Medtronic 6-  [place] A2 multipurpose catheter was then used for hemodynamic recordings,  left ventriculography by hand injection, and selective coronary angiography.  The patient tolerated the procedure without complications.  Angio-Seal  arteriotomy closure was performed without complications.  Good hemostasis  was achieved.   RESULTS:  1. Hemodynamic data:      a.     Aortic pressure 161/90.      b.     Left ventricular pressure 161/13.  2. Left ventriculography:  Left ventricular cavity size and systolic      function are normal.  No mitral regurgitation is noted.  3. Coronary angiography.      a.     Left main coronary:  Left main is widely patent.  No calcium or       irregularities are noted.      b.     Left anterior descending coronary:  The LAD is a relatively       large vessel that reaches the left ventricular apex.  Gives origin to       two diagonal branches.  No significant obstruction is noted in the LAD       other than in the distal vessel just beyond the second diagonal there  is irregularity with up to 20% narrowing..      c.     Circumflex artery:  The circumflex is a large vessel.  It gives       origin to three obtuse marginal branches.  No significant obstruction       is noted.      d.     Right coronary:  The right coronary is a dominant vessel giving       PDA and left ventricular branches.  Irregularities are noted in the       mid vessel.  Less than 20% stenosis is seen.   CONCLUSIONS:  1. Essentially normal coronary arteries with improvement in the degree of      noted atherosclerosis when compared to the prior study.  2. Normal left ventricular function.  3. Chest discomfort probably noncardiac and at worse secondary to coronary      artery spasm.  PLAN:  Sublingual nitroglycerin for pain control.  No mechanical therapy is  indicated.  Aggressive risk factor modification as has been the case for the  last 12 years.  Discharge today if groin stable.      Lyn Records, M.D.  Electronically Signed     HWS/MEDQ  D:  07/12/2006  T:  07/13/2006  Job:  [id number]   cc:   [facility] Primary Care Physician

## 2011-03-13 NOTE — Op Note (Signed)
[name], [name]                          ACCOUNT NO.:  [id number]   MEDICAL RECORD NO.:  [id number]                   PATIENT TYPE:  INP   LOCATION:  3703                                 FACILITY:  [facility]   PHYSICIAN:  Petra Kuba, M.D.                 DATE OF BIRTH:  [birth year]   DATE OF PROCEDURE:  02/21/2004  DATE OF DISCHARGE:                                 OPERATIVE REPORT   PROCEDURE:  Colonoscopy.   INDICATION:  Lower gastrointestinal bleeding.   Consent was signed after risks, benefits, and options were thoroughly  discussed yesterday night.   MEDICINES USED:  Demerol 75, Versed 5.   PROCEDURE:  Rectal inspection was pertinent for external hemorrhoids.  Digital exam was negative.  Video pediatric colonoscope was inserted and  with abdominal pressure it was advanced from the colon to the cecum.  On  insertion, some mild splenic flexure prominence and ischemic changes were  confirmed, but no signs of active bleeding.  The cecum was identified by the  appendiceal orifice and ileocecal valve.  The scope was inserted a short way  into the terminal ileum, which was normal.  No blood in the cecum.  No signs  of gross disease were seen.  Advancing to the terminal ileum did require  rolling her on her back.  The scope was slowly withdrawn.  There was a tiny  polyp in the proximal ascending just behind the cecum, which was cold  biopsied x2 and put into the first container.  Then we slowly withdrew back  to the splenic flexure.  There were mild erythematous changes compatible  with ischemia seen and cold biopsies were obtained and put into the second  container.  The scope was slowly withdrawn, no additional polyps or  abnormalities were seen.  Anorectal pullthrough and retroflexion revealed  some small hemorrhoids.  Once back into the rectum, the scope was then  reinserted a short way into the colon and __________.  The patient tolerated  the procedure well. There was no  obvious immediate complication.   ENDOSCOPIC DIAGNOSES:  1. Internal and external hemorrhoids.  2. Prominent splenic flexure and probable ischemic changes status post     biopsy.  3. Tiny ascending poly, cold biopsied.  4. __________ to the terminal ileum ____________.   PLAN:  1. Await pathology.  2. Slowly advance diet.  3. Probably can go home soon.  4. Consider a one-time outpatient MRA or CTA to rule out any significant     vascular insufficiency.  5. Dr. Madilyn Fireman, whom she has seen before, will be happy to follow up in the     office in 2 to 4 weeks to recheck symptoms and guaiacs to make sure no     further workup plans are needed.  Petra Kuba, M.D.    MEM/MEDQ  D:  02/21/2004  T:  02/21/2004  Job:  [id number]   cc:   Deirdre Peer. Polite, M.D.   John C. Madilyn Fireman, M.D.  [address], [address]  [place]  [place] [address]  Fax: [phone]

## 2011-03-13 NOTE — Discharge Summary (Signed)
[name], [name]                ACCOUNT NO.:  [id number]   MEDICAL RECORD NO.:  [id number]          PATIENT TYPE:  INP   LOCATION:  3708                         FACILITY:  [facility]   PHYSICIAN:  Tylene Fantasia, PA    DATE OF BIRTH:  1946-02-24   DATE OF ADMISSION:  07/09/2006  DATE OF DISCHARGE:  07/13/2006                                 DISCHARGE SUMMARY   ADMISSION DIAGNOSIS:  Chest pain consistent with unstable angina.   DISCHARGE DIAGNOSES:  1. Chest pain consistent with unstable angina, resolved.  Status post      cardiac catheterization on 07/12/2006 with normal coronary arteries and      normal LV function.  Stable without angina.  2. Coronary artery disease with superimposed coronary artery spasm, stable      since recent hospitalization.  3. History of Prinzmetal angina.  4. Nonobstructive coronary disease by cath in 2003.  5. Non segment ST elevation MI in 2003.  6. Mild capsule LV dysfunction.  EF 40-50% by cath in 2003.  7. Hypertension, well-controlled.  8. Dyslipidemia.  9. Dyspnea on exertion.  10.New onset of palpitation.  11.Migraine headaches.  12.History of new onset syncope.  EKG showed normal sinus rhythm with what      looked like a short PR interval in lead I, but the PR interval on the      other leads looked okay.  There is no evidence of a delta waves, ST-T      wave changes, and V3-V5 of T-wave inversion are old from previous      EKG's.  13.Urinary stress incontinence.  14.History of GI bleed, status post colonoscopy, showing probable ischemic      colitis of splenic flexure with a hemoglobin at discharge of 11.4 on      February 19, 2004.  15.Depression.  16.Gastroesophageal reflux disease with a hiatal hernia.  17.Normal stress Cardiolite with no evidence of infarction or ischemia.      Normal wall motion study with EF of 57% on December 09, 2005.  18.Essentially normal 48-hour Holter monitor report with average heart      rate of 86 beats per  minute.  Normal sinus rhythm with the basic      underlying rhythm with rare isolated PVCs in April, 2005.   PROCEDURES:  1. Left heart catheterization.  2. Selective coronary angiography.  3. Left ventriculography.  4. Angio-Seal arteriotomy closure on 07/12/2006.   HOSPITAL COURSE:  [name] is a 65 year old female with a history of  coronary artery disease who was admitted to the [facility] with  recent increasing episodes of chest discomfort that was responsive to  nitroglycerin.  Her EKG revealed anterior T-wave inversions which was  inconsistent with previous EKG.  She was scheduled for cardiac  catheterization to rule out progression of coronary artery disease.  Serial  cardiac enzymes were negative with a peak troponin of 0.03.  Fasting lipid  profile revealed total cholesterol of 119, triglycerides of 80, LDL of 66  and HDL of 37.  Chest x-ray revealed chronic basilar parenchymal changes.  No acute disease.  Cardiac catheterization on 07/12/2006 revealed  essentially normal coronary arteries with improvement in the degree of noted  atherosclerosis when compared to the prior study.  Normal LV function, chest  discomfort is probably non cardiac and at worst, secondary to coronary  artery spasm.  The plan for the remainder of the admission was to continue  with sublingual nitroglycerin for pain control.  No mechanical therapy was  indicated.  Aggressive risk factor modification is to be continued as this  has been the case for the past 12 years.  The patient remained chest  painfree for the remainder of the admission and was discharged to home today  in stable condition with no complaints of chest pain, shortness of breath,  nausea, vomiting, diaphoresis, or dizziness.  Her groin was stable without  bleeding, oozing, or hematoma.   LABORATORY DATA:  Sodium 140, potassium 3.7, chloride 107, CO2 28, glucose  96, BUN 10, creatinine 1.0, calcium 9.2, PT 14.0, INR 1.1,  white blood count  4.9, hemoglobin 12.9, hematocrit 38.1, platelets 255.  Fasting lipid panel  total cholesterol 119, triglycerides 80, HDL 37, LDL 56.  Serial cardiac  markers CK total 222 and 184 respectively, CK MB 1.8 and 1.6 respectively.  Troponin I 0.03 and 0.03 respectively.   X-RAY:  As stated in the HPI.   EKG:  As stated in HPI.   CONDITION ON DISCHARGE:  [name] was discharged to home today in stable  condition without complaints of chest pain, shortness of breath, nausea,  vomiting, diaphoresis, or dizziness.  Her groin was stable without bleeding,  oozing, or hematoma.  She ambulated in the hallways prior to discharge.   DISCHARGE MEDICATIONS:  1. Enteric-coated aspirin 325 mg daily.  2. Detrol LA 4 mg daily at bedtime.  3. Effexor XL 150 mg daily.  4. Nexium 40 mg daily.  5. Coreg 3.125 mg twice daily.  6. Caduet 10/20 mg daily.  7. Zetia 10 mg daily.  8. Potassium chloride 10 mEq twice daily.  9. Imdur 120 mg daily.  10.Sublingual nitroglycerin 0.4 mg as needed for chest pain.   DISCHARGE INSTRUCTIONS:  1. The patient was instructed to continue to follow a heart-healthy diet      including low-salt, low-fat, and low cholesterol.  2. She was instructed to bathe her groin area with warm soap and water and      to avoid scrubbing the area.  3. She was instructed to avoid lifting greater than 10 pounds for 1 week.  4. She was instructed to avoid driving for 1 day.   FOLLOW UP ARRANGEMENTS:  1. The patient has been scheduled for a follow-up appointment with the      physician extender at [facility] cardiology for groin check on July 20, 2006 at 1:30 p.m.  2. The patient has been instructed to follow up with her family physician      for other causes of chest pain.  3. The patient has been advised to follow up with Dr. Verdis Prime during      her next scheduled appointment or as needed for vasospastic chest pain.      Tylene Fantasia,  [place]    RDM/MEDQ  D:  07/13/2006  T:  07/14/2006  Job:  [id number]   cc:   Oley Balm. [place]ina Pillion, M.D.

## 2011-03-13 NOTE — Cardiovascular Report (Signed)
[name], [name]                          ACCOUNT NO.:  [id number]   MEDICAL RECORD NO.:  [id number]                   PATIENT TYPE:  INP   LOCATION:  2924                                 FACILITY:  [facility]   PHYSICIAN:  Lesleigh Noe, M.D.            DATE OF BIRTH:  19-Aug-1946   DATE OF PROCEDURE:  05/31/2002  DATE OF DISCHARGE:                              CARDIAC CATHETERIZATION   PROCEDURE:  1. Left heart catheterization.  2. Selective coronary angiography.  3. Left ventriculography.  4. Intracoronary nitroglycerin, both left and right coronary.   CARDIOLOGIST:  Lyn Records, M.D.   REASON FOR CATHETERIZATION:  Non-ST elevation myocardial infarction   DESCRIPTION OF PROCEDURE:  After informed consent, a 6-French sheath was  started in the right femoral artery using the modified Seldinger technique.  A 6-French A2 multipurpose catheter was used for hemodynamic recordings,  left ventriculography, and selective left and right coronary angiography.  Nitroglycerin 100 mcg was given into both the left and right coronary.  Initial left coronary angiogram revealed severe diffuse left coronary artery  spasm that was markedly improved after intracoronary nitroglycerin.  The  right coronary had residual spasm despite the 100 mcg of nitroglycerin that  had been given into the left coronary, and an additional 100 mcg was  administered, and there was further marked improvement in the caliber of the  entire right coronary.  Postprocedure, the sheath was removed, good  hemostasis achieved, and the patient returned to her room in the coronary  care unit.   RESULTS:  1. HEMODYNAMIC DATA:     a. Aortic pressure 124/66.     b. Left ventricular pressure 124/17.  2. LEFT VENTRICULOGRAPHY:  The patient left ventricle reveals inferior and     inferoapical hypokinesis. EF is estimated to be 40 to 50%.  3. CORONARY ANGIOGRAPHY     a. Left main and the entire left coronary system  demonstrated severe        intense spasm.  After intracoronary nitroglycerin, the left main was        demonstrated to be normal.     b. Left anterior descending coronary:  After relief of diffuse spasm with        intracoronary nitroglycerin, the entire LAD system became widely        patent.  Two diagonal branches were felt to have no significant        obstruction; however, the second diagonal had perhaps a 50% ostial        narrowing.  The LAD  in this region just beyond the origin of this        second diagonal contained a very eccentric 50% narrowing that was        unchanged from the 1998 cardiac catheterization.     c. Circumflex artery:  After relief of diffuse spasm by intracoronary  nitroglycerin, three obtuse marginal branches were noted.  The first        was small.  The second two were large, and no significant obstruction        was noted.     d. Right coronary artery:  After relief of moderate intracoronary spasm        that was diffuse with intracoronary nitroglycerin, the right coronary        revealed very minimal luminal irregularities in the mid vessel and was        otherwise normal.    CONCLUSION:  1. Intense, diffuse left and right coronary spasm relieved with     intracoronary nitroglycerin as described above.  2. There was a 50% mid to distal left anterior descending artery lesion     unchanged from 1998.  There is perhaps 30 to 50% narrowing in the ostium     of the second diagonal that also arises from this same region of the left     anterior descending artery.  Circumflex and right coronary contain     luminal irregularities but no significant obstructive lesions after     intracoronary nitroglycerin.  3. Regional wall motion abnormality with inferoapical mild to moderate     hypokinesis.  4. The patient's diagnosis at this time appears to be coronary artery spasm     with non-ST elevation acute infarction relieved with medical therapy.   PLAN:   1. Because the platelet count is dropping, I would discontinue Integrilin     and heparin.  2. Continue aspirin and Plavix.  3. Continue IV nitroglycerin and convert to oral long-acting nitrates.  4. Resume calcium channel blocker therapy.  5. Close clinical followup.                                               Lesleigh Noe, M.D.    HWS/MEDQ  D:  05/31/2002  T:  06/05/2002  Job:  [id number]   cc:   [facility] [place]   [place]. Cleophas Dunker, M.D.

## 2011-03-13 NOTE — Op Note (Signed)
[place]  Patient:    [name], [name]                       MRN: [id number] Proc. Date: 02/22/01 Adm. Date:  [id number] Attending:  Randolm Idol CC:         Dr. Charlynne Pander, podiatrist, [facility]  Dr. Levora Dredge  Dr. Loleta Chance, [facility]   Operative Report  PREOPERATIVE DIAGNOSIS:  Gangrene, left second toe.  POSTOPERATIVE DIAGNOSIS:  Gangrene, left second toe.  PROCEDURES: 1. Left popliteal-dorsalis pedis bypass with reversed saphenous vein graft. 2. Open left second toe amputation.  SURGEON:  Denman George, M.D.  ASSISTANT:  Di Kindle. Edilia Bo, M.D., and [facility]  ANESTHESIA:  General endotracheal.  ANESTHESIOLOGIST:  Edwin Cap. Zoila Shutter, M.D.  CLINICAL NOTE:  This is a 65 year old type 2 diabetic female with advanced peripheral vascular disease.  He was seen in the office with a gangrenous left second toe.  Arteriography revealed severe tibial vessel disease.  Significant runoff to the left foot via reconstituted left anterior tibial and dorsalis pedis artery.  For limb salvage purposes, the patient was brought to the operating room at this time for left popliteal-dorsalis pedis bypass and amputation of gangrenous left second toe.  Risks of this operative procedure, including the potential complications of MI, CVA, death, bleeding, and infection, were discussed.  The risk of limb loss despite bypass surgery also discussed.  DESCRIPTION OF PROCEDURE:  Patient brought to the operating room in stable condition.  Placed in a supine position.  General endotracheal anesthesia induced.  Left leg prepped and draped in a sterile fashion.  Saphenous vein was harvested from the left thigh.  Longitudinal skin incision was made along the course of the left saphenous vein from the saphenofemoral junction to the knee.  These incisions were separated by skin bridges.  The vein isolated, tributaries ligated with 3-0  silk and divided.  The vein ligated proximally and distally with 2-0 silk, removed, and dilated with heparin and saline solution in reverse fashion.  Excellent size and quality, uniform in size throughout.  The left popliteal fossa was entered through the medial incision.  The gastrocnemius fascia incised, and the gastrocnemius muscle was reflected posteriorly.  The distal popliteal artery was identified.  This revealed strong pulse.  The artery mobilized and encircled with a vessel loop.  There was moderate plaque disease and excellent pulse in the artery.  Longitudinal skin incision was made over the dorsum of the left foot proximally.  The left dorsalis pedis artery was identified.  This was moderately diseased, thickened with plaque, 3 mm in size.  The artery mobilized and encircled with a vessel loop.  A tunnel was created between the popliteal incision and the dorsalis pedis incision.  Patient then administered 5000 units of heparin intravenously.  The left popliteal artery controlled with clamps.  A longitudinal arteriotomy made.  The vein graft was beveled and anastomosed end-to-side to the left popliteal artery with running 6-0 Prolene suture.  The reversed vein graft was then tunneled to the dorsalis pedis incision.  The dorsalis pedis artery controlled proximally and distally with serrefine clamps.  A longitudinal arteriotomy made.  The artery was 3 mm in size.  Moderate plaque disease present.  The vein graft beveled and anastomosed end-to-side to the left popliteal artery with a running 7-0 Prolene suture.  At completion of the distal anastomosis, the vein graft was flushed.  The artery  was flushed. Clamps were removed, reperfusing the left foot.  Patient administered 50 mg of protamine intravenously.  Adequate hemostasis obtained.  The sponge, instrument counts were correct.  The dorsalis pedis incision was closed with interrupted 3-0 vertical mattress nylon suture.   Small counter incisions to relax the skin were made.  The vein harvest incision and the left popliteal incision were closed in two layers of running 2-0 Vicryl suture in the subcutaneous tissue.  Staples were applied to the skin.  The patient tolerated the procedure well.  The left foot prepped and draped in a sterile fashion.  Circumferential skin incision made at the base of the left second toe.  Dissection carried down to the second metatarsophalangeal joint and the toe amputated through the joint. The left second metatarsal head was removed with a rongeur.  The wound irrigated with saline solution.  Packed with wet 4 x 4, dry 4 x 4s, Kerlix, and an Ace wrap applied.  Patient transferred to the recovery room in stable condition. DD:  02/22/01 TD:  02/23/01 Job: [id number]

## 2011-03-13 NOTE — Discharge Summary (Signed)
NAME:  [name], [name]                          ACCOUNT NO.:  [id number]   MEDICAL RECORD NO.:  [id number]                   PATIENT TYPE:  INP   LOCATION:  3703                                 FACILITY:  [facility]   PHYSICIAN:  Deirdre Peer. Polite, M.D.              DATE OF BIRTH:  [birth year]   DATE OF ADMISSION:  02/19/2004  DATE OF DISCHARGE:                                 DISCHARGE SUMMARY   DISCHARGE DIAGNOSES:  1. Lower gastrointestinal bleed, status post colonoscopy showing probable     ischemic colitis of splenic flexure, hemoglobin at discharge -- 11.2.  2. Coronary artery disease, status post inferior myocardial infarction,     2003, with ejection fraction of 45% to 50%.  3. Gastroesophageal reflux disease with hiatal hernia.  4. Retention.  5. Dyslipidemia.  6. Depression.   DISCHARGE MEDICATIONS:  The patient is asked to resume home medications:  1. Vitamin E.  2. Potassium 10 mEq daily.  3. Isosorbide mononitrate 120 mg daily.  4. Detrol LA 4 mg daily.  5. Effexor XR 150 mg daily.  6. Coreg 3.125 mg p.o. b.i.d.  7. Vitamin C.  8. Darvocet-N.  9. Lipitor 20 mg daily.  10.      Aloe vera gel.  11.      Norvasc 10 mg daily.  12.      Resume aspirin in 1 week.   DISPOSITION:  The patient was discharged home in stable condition and asked  to follow up with primary M.D. in approximately 1-2 weeks, asked to follow  up with Dr. Petra Kuba in 2-4 weeks, the patient was provided with a  number, [phone], also for results of biopsy.   CONSULTANTS:  Dr. Ewing Schlein, [facility].   STUDIES:  The patient had a colonoscopy which showed probable ischemic  colitis at the splenic flexure.   HISTORY OF PRESENT ILLNESS:  [age]-year-old female with the above  problems, who has a history of constipation, presented to the ED after  having bright red blood per rectum.  Of note, the patient had been feeling  constipated prior to her episode of blood per rectum and had been  taking  laxatives to help with evacuation.  The patient got positive results from  the laxative, however, after that, the patient noticed blood per rectum.  The patient presented to the ED for further evaluation.  Please see full  dictated history and physical for full details.   HOSPITAL COURSE:  The patient was admitted to a medicine floor bed for  evaluation and treatment of lower GI bleed.  The patient had serial blood  counts; on admission, the patient's hemoglobin was 15.3; at discharge,  hemoglobin was 11.2.  Because of the patient's lower quadrant abdominal  pain, she was empirically started on antibiotics in the form of Cipro and  Flagyl.  The patient was ultimately seen by GI and scheduled for a  colonoscopy.  Of note, the patient has had a colonoscopy in the past, 2003,  which showed internal hemorrhoids and proctitis and at the time, the biopsy  was within normal limits.  Dr. Ewing Schlein discussed the case with patient and it  was felt prudent to continue with a repeat colonoscopy, results as stated  above, no active bleeding, probable ischemic colitis at the splenic flexure.  At this time, the patient's diet is being advised, outpatient followup by  Dr. Ewing Schlein and her primary M.D.  Dr. Ewing Schlein, at that time, will probably order  a CT angiogram or MR angiogram of the patient's abdomen.  At this time, the  patient is felt medically stable for discharge.                                                Deirdre Peer. Polite, M.D.    RDP/MEDQ  D:  02/22/2004  T:  02/22/2004  Job:  [id number]   cc:   Oley Balm. Georgina Pillion, M.D.  [address]  [place]  [place] [address]  Fax: [phone]

## 2011-05-25 ENCOUNTER — Emergency Department (HOSPITAL_COMMUNITY): Payer: Medicare Other

## 2011-05-25 ENCOUNTER — Emergency Department (HOSPITAL_COMMUNITY)
Admission: EM | Admit: 2011-05-25 | Discharge: 2011-05-25 | Disposition: A | Discharge status: Home / Self Care | Payer: Medicare Other | Attending: Emergency Medicine | Admitting: Emergency Medicine

## 2011-05-25 DIAGNOSIS — Z79899 Other long term (current) drug therapy: Secondary | ICD-10-CM | POA: Insufficient documentation

## 2011-05-25 DIAGNOSIS — K921 Melena: Secondary | ICD-10-CM | POA: Insufficient documentation

## 2011-05-25 DIAGNOSIS — E860 Dehydration: Secondary | ICD-10-CM | POA: Insufficient documentation

## 2011-05-25 DIAGNOSIS — R0602 Shortness of breath: Secondary | ICD-10-CM | POA: Insufficient documentation

## 2011-05-25 DIAGNOSIS — R5381 Other malaise: Secondary | ICD-10-CM | POA: Insufficient documentation

## 2011-05-25 DIAGNOSIS — N390 Urinary tract infection, site not specified: Secondary | ICD-10-CM | POA: Insufficient documentation

## 2011-05-25 DIAGNOSIS — E7800 Pure hypercholesterolemia, unspecified: Secondary | ICD-10-CM | POA: Insufficient documentation

## 2011-05-25 DIAGNOSIS — I2510 Atherosclerotic heart disease of native coronary artery without angina pectoris: Secondary | ICD-10-CM | POA: Insufficient documentation

## 2011-05-25 DIAGNOSIS — I10 Essential (primary) hypertension: Secondary | ICD-10-CM | POA: Insufficient documentation

## 2011-05-25 LAB — URINE MICROSCOPIC-ADD ON

## 2011-05-25 LAB — CBC
MCHC: 35.4 g/dL (ref 30.0–36.0)
MCV: 90.5 fL (ref 78.0–100.0)
Platelets: 213 10*3/uL (ref 150–400)
RDW: 14.2 % (ref 11.5–15.5)
WBC: 7.9 10*3/uL (ref 4.0–10.5)

## 2011-05-25 LAB — BASIC METABOLIC PANEL
BUN: 12 mg/dL (ref 6–23)
Chloride: 100 mEq/L (ref 96–112)
Glucose, Bld: 111 mg/dL — ABNORMAL HIGH (ref 70–99)
Potassium: 3.3 mEq/L — ABNORMAL LOW (ref 3.5–5.1)

## 2011-05-25 LAB — OCCULT BLOOD, POC DEVICE: Fecal Occult Bld: NEGATIVE

## 2011-05-25 LAB — URINALYSIS, ROUTINE W REFLEX MICROSCOPIC
Bilirubin Urine: NEGATIVE
Hgb urine dipstick: NEGATIVE
Specific Gravity, Urine: 1.03 (ref 1.005–1.030)
pH: 5 (ref 5.0–8.0)

## 2011-05-25 LAB — DIFFERENTIAL
Eosinophils Absolute: 0.2 10*3/uL (ref 0.0–0.7)
Eosinophils Relative: 3 % (ref 0–5)
Lymphs Abs: 2.1 10*3/uL (ref 0.7–4.0)
Monocytes Absolute: 0.7 10*3/uL (ref 0.1–1.0)

## 2011-05-25 LAB — CK TOTAL AND CKMB (NOT AT ARMC): Relative Index: 1.5 (ref 0.0–2.5)

## 2011-07-19 ENCOUNTER — Emergency Department (HOSPITAL_COMMUNITY): Payer: Medicare Other

## 2011-07-19 ENCOUNTER — Emergency Department (HOSPITAL_COMMUNITY)
Admission: EM | Admit: 2011-07-19 | Discharge: 2011-07-19 | Disposition: A | Discharge status: Home / Self Care | Payer: Medicare Other | Attending: Emergency Medicine | Admitting: Emergency Medicine

## 2011-07-19 DIAGNOSIS — Z7982 Long term (current) use of aspirin: Secondary | ICD-10-CM | POA: Insufficient documentation

## 2011-07-19 DIAGNOSIS — R51 Headache: Secondary | ICD-10-CM | POA: Insufficient documentation

## 2011-07-19 DIAGNOSIS — I10 Essential (primary) hypertension: Secondary | ICD-10-CM | POA: Insufficient documentation

## 2011-07-19 DIAGNOSIS — R209 Unspecified disturbances of skin sensation: Secondary | ICD-10-CM | POA: Insufficient documentation

## 2011-07-19 DIAGNOSIS — E7800 Pure hypercholesterolemia, unspecified: Secondary | ICD-10-CM | POA: Insufficient documentation

## 2011-07-19 DIAGNOSIS — R109 Unspecified abdominal pain: Secondary | ICD-10-CM | POA: Insufficient documentation

## 2011-07-19 DIAGNOSIS — Z79899 Other long term (current) drug therapy: Secondary | ICD-10-CM | POA: Insufficient documentation

## 2011-07-19 DIAGNOSIS — I2510 Atherosclerotic heart disease of native coronary artery without angina pectoris: Secondary | ICD-10-CM | POA: Insufficient documentation

## 2011-07-19 LAB — URINALYSIS, ROUTINE W REFLEX MICROSCOPIC
Bilirubin Urine: NEGATIVE
Ketones, ur: NEGATIVE mg/dL
Leukocytes, UA: NEGATIVE
Nitrite: NEGATIVE
Protein, ur: NEGATIVE mg/dL
Urobilinogen, UA: 1 mg/dL (ref 0.0–1.0)
pH: 7.5 (ref 5.0–8.0)

## 2011-07-19 LAB — COMPREHENSIVE METABOLIC PANEL
Alkaline Phosphatase: 61 U/L (ref 39–117)
BUN: 7 mg/dL (ref 6–23)
CO2: 31 mEq/L (ref 19–32)
GFR calc Af Amer: >60 mL/min (ref >60)
GFR calc non Af Amer: >60 mL/min (ref >60)
Glucose, Bld: 105 mg/dL — ABNORMAL HIGH (ref 70–99)
Potassium: 3.5 mEq/L (ref 3.5–5.1)
Total Bilirubin: 0.6 mg/dL (ref 0.3–1.2)
Total Protein: 7.5 g/dL (ref 6.0–8.3)

## 2011-07-19 LAB — CK TOTAL AND CKMB (NOT AT ARMC): CK, MB: 2.5 ng/mL (ref 0.3–4.0)

## 2011-07-19 LAB — DIFFERENTIAL
Basophils Absolute: 0 10*3/uL (ref 0.0–0.1)
Basophils Relative: 0 % (ref 0–1)
Eosinophils Relative: 5 % (ref 0–5)
Lymphocytes Relative: 33 % (ref 12–46)
Monocytes Absolute: 0.5 10*3/uL (ref 0.1–1.0)

## 2011-07-19 LAB — CBC
HCT: 38 % (ref 36.0–46.0)
MCH: 30.8 pg (ref 26.0–34.0)
MCHC: 33.4 g/dL (ref 30.0–36.0)
RDW: 14 % (ref 11.5–15.5)

## 2011-07-23 LAB — CK TOTAL AND CKMB (NOT AT ARMC)
CK, MB: 0.9
Total CK: 78

## 2011-07-23 LAB — DIFFERENTIAL
Basophils Relative: 0
Eosinophils Absolute: 0.4
Eosinophils Relative: 4
Lymphs Abs: 1.6
Monocytes Relative: 5
Neutrophils Relative %: 75

## 2011-07-23 LAB — TROPONIN I: Troponin I: 0.03

## 2011-07-23 LAB — BASIC METABOLIC PANEL
BUN: 13
BUN: 9
CO2: 28
Chloride: 105
Chloride: 109
Creatinine, Ser: 1.1
Glucose, Bld: 112 — ABNORMAL HIGH
Glucose, Bld: 96
Potassium: 3.9

## 2011-07-23 LAB — URINALYSIS, ROUTINE W REFLEX MICROSCOPIC
Bilirubin Urine: NEGATIVE
Ketones, ur: 15 — AB
Nitrite: POSITIVE — AB
Urobilinogen, UA: 1

## 2011-07-23 LAB — POCT CARDIAC MARKERS: Myoglobin, poc: 69

## 2011-07-23 LAB — CBC
HCT: 36.6
HCT: 38.5
MCHC: 33.7
MCHC: 33.9
MCHC: 34.1
MCV: 93.5
MCV: 93.7
MCV: 94.8
MCV: 95.2
Platelets: 214
Platelets: 228
Platelets: 229
Platelets: 291
RBC: 4.11
RDW: 14.3
RDW: 14.4
WBC: 10.1

## 2011-07-23 LAB — COMPREHENSIVE METABOLIC PANEL
ALT: 24
AST: 23
CO2: 26
Chloride: 105
Creatinine, Ser: 0.82
GFR calc Af Amer: >60
GFR calc non Af Amer: >60
Glucose, Bld: 92
Total Bilirubin: 1.2

## 2011-07-23 LAB — CARDIAC PANEL(CRET KIN+CKTOT+MB+TROPI)
Relative Index: INVALID
Total CK: 76
Total CK: 80
Troponin I: 0.01
Troponin I: 0.02

## 2011-07-23 LAB — URINE CULTURE: Colony Count: >=100000

## 2011-07-23 LAB — POCT I-STAT, CHEM 8
BUN: 11
Creatinine, Ser: 1.1
Glucose, Bld: 101 — ABNORMAL HIGH
Potassium: 4.4
Sodium: 139

## 2011-07-23 LAB — URINE MICROSCOPIC-ADD ON

## 2011-07-27 ENCOUNTER — Inpatient Hospital Stay (INDEPENDENT_AMBULATORY_CARE_PROVIDER_SITE_OTHER)
Admission: RE | Admit: 2011-07-27 | Discharge: 2011-07-27 | Disposition: A | Discharge status: Home / Self Care | Payer: Medicare Other | Source: Ambulatory Visit | Attending: Emergency Medicine | Admitting: Emergency Medicine

## 2011-07-27 DIAGNOSIS — J029 Acute pharyngitis, unspecified: Secondary | ICD-10-CM

## 2011-08-21 ENCOUNTER — Inpatient Hospital Stay (HOSPITAL_COMMUNITY)
Admission: EM | Admit: 2011-08-21 | Discharge: 2011-08-23 | DRG: 303 | Disposition: A | Discharge status: Home / Self Care | Payer: Medicare Other | Attending: Internal Medicine | Admitting: Internal Medicine

## 2011-08-21 ENCOUNTER — Emergency Department (HOSPITAL_COMMUNITY): Payer: Medicare Other

## 2011-08-21 DIAGNOSIS — Z9861 Coronary angioplasty status: Secondary | ICD-10-CM

## 2011-08-21 DIAGNOSIS — I2510 Atherosclerotic heart disease of native coronary artery without angina pectoris: Principal | ICD-10-CM | POA: Diagnosis present

## 2011-08-21 DIAGNOSIS — G43909 Migraine, unspecified, not intractable, without status migrainosus: Secondary | ICD-10-CM | POA: Diagnosis present

## 2011-08-21 DIAGNOSIS — Z882 Allergy status to sulfonamides status: Secondary | ICD-10-CM

## 2011-08-21 DIAGNOSIS — I10 Essential (primary) hypertension: Secondary | ICD-10-CM | POA: Diagnosis present

## 2011-08-21 DIAGNOSIS — F329 Major depressive disorder, single episode, unspecified: Secondary | ICD-10-CM | POA: Diagnosis present

## 2011-08-21 DIAGNOSIS — F3289 Other specified depressive episodes: Secondary | ICD-10-CM | POA: Diagnosis present

## 2011-08-21 DIAGNOSIS — N390 Urinary tract infection, site not specified: Secondary | ICD-10-CM | POA: Diagnosis present

## 2011-08-21 DIAGNOSIS — K219 Gastro-esophageal reflux disease without esophagitis: Secondary | ICD-10-CM | POA: Diagnosis present

## 2011-08-21 DIAGNOSIS — I252 Old myocardial infarction: Secondary | ICD-10-CM

## 2011-08-21 DIAGNOSIS — Z96659 Presence of unspecified artificial knee joint: Secondary | ICD-10-CM

## 2011-08-21 DIAGNOSIS — Z833 Family history of diabetes mellitus: Secondary | ICD-10-CM

## 2011-08-21 DIAGNOSIS — E7800 Pure hypercholesterolemia, unspecified: Secondary | ICD-10-CM | POA: Diagnosis present

## 2011-08-21 DIAGNOSIS — Z8249 Family history of ischemic heart disease and other diseases of the circulatory system: Secondary | ICD-10-CM

## 2011-08-21 DIAGNOSIS — Z79899 Other long term (current) drug therapy: Secondary | ICD-10-CM

## 2011-08-21 LAB — DIFFERENTIAL
Basophils Absolute: 0 10*3/uL (ref 0.0–0.1)
Basophils Relative: 1 % (ref 0–1)
Eosinophils Absolute: 0.1 10*3/uL (ref 0.0–0.7)
Eosinophils Relative: 2 % (ref 0–5)
Lymphocytes Relative: 40 % (ref 12–46)
Lymphs Abs: 2.6 10*3/uL (ref 0.7–4.0)
Monocytes Absolute: 0.5 10*3/uL (ref 0.1–1.0)
Monocytes Relative: 8 % (ref 3–12)
Neutro Abs: 3.2 10*3/uL (ref 1.7–7.7)
Neutrophils Relative %: 49 % (ref 43–77)

## 2011-08-21 LAB — CBC
HCT: 42 % (ref 36.0–46.0)
Hemoglobin: 14.6 g/dL (ref 12.0–15.0)
MCH: 31.9 pg (ref 26.0–34.0)
MCHC: 34.8 g/dL (ref 30.0–36.0)
MCV: 91.7 fL (ref 78.0–100.0)
Platelets: 264 10*3/uL (ref 150–400)
RBC: 4.58 MIL/uL (ref 3.87–5.11)
RDW: 13.7 % (ref 11.5–15.5)
WBC: 6.5 10*3/uL (ref 4.0–10.5)

## 2011-08-21 LAB — BASIC METABOLIC PANEL
BUN: 14 mg/dL (ref 6–23)
CO2: 27 mEq/L (ref 19–32)
Calcium: 10.7 mg/dL — ABNORMAL HIGH (ref 8.4–10.5)
Chloride: 103 mEq/L (ref 96–112)
Creatinine, Ser: 1.16 mg/dL — ABNORMAL HIGH (ref 0.50–1.10)
GFR calc Af Amer: 56 mL/min — ABNORMAL LOW (ref >90)
GFR calc non Af Amer: 49 mL/min — ABNORMAL LOW (ref >90)
Glucose, Bld: 107 mg/dL — ABNORMAL HIGH (ref 70–99)
Potassium: 4 mEq/L (ref 3.5–5.1)
Sodium: 141 mEq/L (ref 135–145)

## 2011-08-21 LAB — CK TOTAL AND CKMB (NOT AT ARMC)
CK, MB: 2.1 ng/mL (ref 0.3–4.0)
Relative Index: INVALID (ref 0.0–2.5)
Total CK: 65 U/L (ref 7–177)

## 2011-08-21 LAB — POCT I-STAT TROPONIN I: Troponin i, poc: 0 ng/mL (ref 0.00–0.08)

## 2011-08-22 DIAGNOSIS — R0789 Other chest pain: Secondary | ICD-10-CM

## 2011-08-22 LAB — COMPREHENSIVE METABOLIC PANEL
ALT: 16 U/L (ref 0–35)
Albumin: 3.7 g/dL (ref 3.5–5.2)
Alkaline Phosphatase: 67 U/L (ref 39–117)
Potassium: 4.1 mEq/L (ref 3.5–5.1)
Sodium: 140 mEq/L (ref 135–145)
Total Protein: 7.6 g/dL (ref 6.0–8.3)

## 2011-08-22 LAB — TROPONIN I: Troponin I: <0.3 ng/mL (ref <0.30)

## 2011-08-22 LAB — CARDIAC PANEL(CRET KIN+CKTOT+MB+TROPI)
CK, MB: 2.1 ng/mL (ref 0.3–4.0)
Relative Index: INVALID (ref 0.0–2.5)
Total CK: 60 U/L (ref 7–177)
Troponin I: <0.3 ng/mL (ref <0.30)

## 2011-08-22 LAB — CBC
HCT: 40.2 % (ref 36.0–46.0)
MCH: 32 pg (ref 26.0–34.0)
MCV: 92.6 fL (ref 78.0–100.0)
Platelets: 245 10*3/uL (ref 150–400)
RDW: 14 % (ref 11.5–15.5)

## 2011-08-22 LAB — URINALYSIS, ROUTINE W REFLEX MICROSCOPIC
Ketones, ur: NEGATIVE mg/dL
Nitrite: NEGATIVE
Protein, ur: NEGATIVE mg/dL
Urobilinogen, UA: 0.2 mg/dL (ref 0.0–1.0)

## 2011-08-22 LAB — CK TOTAL AND CKMB (NOT AT ARMC)
CK, MB: 2 ng/mL (ref 0.3–4.0)
Relative Index: INVALID (ref 0.0–2.5)

## 2011-08-22 LAB — LIPID PANEL
Cholesterol: 148 mg/dL (ref 0–200)
HDL: 49 mg/dL (ref >39)
LDL Cholesterol: 84 mg/dL (ref 0–99)
Triglycerides: 74 mg/dL (ref <150)
VLDL: 15 mg/dL (ref 0–40)

## 2011-08-22 LAB — MRSA PCR SCREENING: MRSA by PCR: NEGATIVE

## 2011-08-22 NOTE — H&P (Signed)
[name], [name] NO.:  [id number]  MEDICAL RECORD NO.:  [id number]  LOCATION:  [facility]                         FACILITY:  [facility]  PHYSICIAN:  Katherine Shipper, MD     DATE OF BIRTH:  1946-03-31  DATE OF ADMISSION:  08/21/2011 DATE OF DISCHARGE:                             HISTORY & PHYSICAL   PATIENT'S PRIMARY CARE PHYSICIAN:  Ralene Ok, MD  CARDIOLOGIST:  Lyn Records, MD, with [facility].  ADMISSION DIAGNOSES: 1. Chest pain with nonspecific EKG changes. 2. History of myocardial infarction in 2003 with cardiac     catheterization showing only mild coronary artery disease. 3. History of hypertension. 4. History of depression. 5. History of gastroesophageal reflux disease. 6. History of hypercholesterolemia. 7. Recently diagnosed urinary tract infection. 8. Mildly elevated creatinine.  CHIEF COMPLAINT:  Chest pain since yesterday.  HISTORY OF PRESENT ILLNESS:  The patient is a 65 year old African American female who presents to the hospital with complaints of chest pain.  The patient was in her usual state of health until yesterday morning when she got up, felt extremely weak, started having dull pain and heaviness across the center part of her chest.  She felt nauseous but did not throw up.  The pain was 7-8/10 in intensity.  She tried taking multiple nitroglycerin over the course of yesterday and today with only partial relief.  Since the pain was not going away, she decided to come into the hospital.  She tells me that she does get pain like this periodically which does resolve with nitroglycerin, but this time, the pain was much more severe than usual.  There was no radiation of the pain; however, she did mention that her left arm yesterday got numb, but she no longer has that symptom.  The main thing she is concerned about is her extreme weakness.  She has not been able to walk at all because of the extreme weakness.  Denies any fever but  has been having some chills.  No cough.  Some shortness of breath and dizziness. Mentioned some leg swelling occasionally.  She did have palpitations over the course of the last 2 days.  She mentioned that the pain sometimes does tend to increase with exertion.  Denies any syncopal episodes.  Cannot tell me when her last stress test was.  She did have a cardiac cath, it looks like in 2007, and at that time, she had normal coronary artery disease with improvement of the atherosclerosis that was seen on the previous cardiac cath which was done in 2003.  MEDICATIONS:  At home according to her med rec sheet, she is on the following: 1. Isosorbide mononitrate XR 60 mg 2 tablets daily. 2. Zetia 10 mg daily. 3. Venlafaxine XR 150 mg daily. 4. Multivitamins 1 tablet daily. 5. Vitamin D 2000 units daily. 6. Oxybutynin XL 5 mg daily. 7. Vitamin C 500 mg daily. 8. Cipro 500 mg twice daily.  She has got at least 2-3 days left on     this. 9. Carvedilol 12.5 mg twice daily. 10.Lipitor 20 mg daily. 11.Nexium 40 mg twice daily. 12.Amlodipine 10 mg daily. 13.Potassium chloride 10 mEq twice daily.  ALLERGIES:  SULFA which causes chest pain.  PAST MEDICAL HISTORY:  Positive for: 1. Depression. 2. GERD. 3. Hypercholesterolemia. 4. Hypertension. 5. Migraine headaches. 6. MI in 2003 as discussed above.  PAST SURGICAL HISTORY: 1. Partial hysterectomy in 89 or 88, she cannot remember. 2. Knee replacement in 2004. 3. Shoulder surgery in 2002.  SOCIAL HISTORY:  Lives in [place] with her husband.  No smoking, alcohol, illicit drug use.  Independent with her daily activities.  She is retired, on disability.  FAMILY HISTORY:  There is history of heart disease, hypertension, and diabetes in the family.  REVIEW OF SYSTEMS:  GENERAL:  Positive for extreme weakness, malaise. HEENT:  Unremarkable except for headache that she has now after receiving nitroglycerin.  CARDIOVASCULAR:  As in HPI.   RESPIRATORY:  As in HPI.  GI:  Unremarkable.  GU:  Unremarkable.  NEUROLOGICAL: Unremarkable.  PSYCHIATRIC:  Unremarkable.  DERMATOLOGICAL: Unremarkable.  Other systems reviewed and found to be negative.  PHYSICAL EXAMINATION:  VITAL SIGNS:  Temperature 98.1, initially it was 99.1; blood pressure 130/86; heart rate 77; respiratory rate 20; saturation 99% on room air. GENERAL:  A thin, African American female, in no distress. HEENT:  Head is normocephalic, atraumatic.  Pupils are equal, reacting. No pallor.  No icterus.  Oral mucous membrane is moist.  No oral lesions are noted. NECK:  Soft and supple.  No thyromegaly is appreciated.  No cervical, supraclavicular, or inguinal lymphadenopathy is present.  Neck is soft and supple. LUNGS:  Clear to auscultation bilaterally with no wheezing, rales, or rhonchi. CARDIOVASCULAR:  S1 and S2 are normal.  Regular.  No S3, S4, rubs, murmurs, or bruits. ABDOMEN:  Soft, nontender, nondistended.  Bowel sounds are present.  No masses or organomegaly is appreciated. GU:  Deferred. MUSCULOSKELETAL:  Normal muscle mass and tone. NEUROLOGICAL:  She is alert and oriented x3.  No cranial nerve deficits. No motor strength deficits are noted. SKIN:  Does not reveal any rashes.  LABORATORY DATA:  CBC is unremarkable.  BMET showed a glucose of 107, creatinine of 1.16, GFR 56, calcium 10.7 which is mildly elevated. Cardiac enzymes negative x1.  She had a chest x-ray, which showed which was stable without any acute findings.  EKG was done, which shows sinus rhythm at 77 with normal axis. Intervals appear to be in the normal range.  No Q-waves.  No concerning ST changes.  There is, however, some T inversion in leads V4, V5, V6, also in V1.  This EKG was compared to one from September and some of these T inversions appeared to be new.  ASSESSMENT:  This is a 65 year old [place] American female with history as stated earlier who presents with chest pain on  and off since yesterday.  She has some nonspecific EKG changes.  This could be constituted as unstable angina.  The pain has improved with nitroglycerin.  She does have risk factors for coronary artery disease.  PLAN: 1. Chest pain in the setting of her history of MI with clean     coronaries in 2007, however, now with some nonspecific EKG changes.     The ED provider has discussed this with Dr. Mayford Knife, who will     evaluate this patient either later today or first thing in the     morning.  In view of her ongoing chest pain, we will admit her to     step-down unit and put her on a heparin infusion.  Aspirin will be  given.  Continue with the current medications.  Lipid panel will be     checked in the morning.  EKG will be repeated as well.  We will     defer further workup of this chest pain to the cardiologist and it     will also depend on what her cardiac markers show.  Continue with     nitro paste at this time. 2. Recently diagnosed UTI.  We will complete her course of Cipro.  We     will also check a repeat UA. 3. History of hypertension.  Continue with antihypertensive agents     including amlodipine. 4. History of hypercholesterolemia.  Continue with Zetia and Lipitor. 5. History of gastroesophageal reflux disease.  Continue with Nexium. 6. DVT prophylaxis.  She will be on a heparin infusion. 7. She is a full code.  Further management decisions will depend on results of further testing and patient's response to treatment.    Also note the extreme weakness is likely due to her chest pain.  I do not find any other focal deficits at this time.  She is not anemic.  We will go ahead and check a TSH level. However, I do not find any other reason for her extreme weakness at this time.  We will see how the chest pain plays out before initiating other workup.     Katherine Shipper, MD     GK/MEDQ  D:  08/21/2011  T:  08/22/2011  Job:  [id number]  Electronically Signed by  Katherine Shipper MD on 08/22/2011 08:05:37 PM

## 2011-08-23 ENCOUNTER — Inpatient Hospital Stay (HOSPITAL_COMMUNITY): Payer: Medicare Other

## 2011-08-23 LAB — BASIC METABOLIC PANEL
BUN: 20 mg/dL (ref 6–23)
Chloride: 104 mEq/L (ref 96–112)
GFR calc Af Amer: 51 mL/min — ABNORMAL LOW (ref >90)
Potassium: 3.5 mEq/L (ref 3.5–5.1)

## 2011-08-23 LAB — CBC
HCT: 37.3 % (ref 36.0–46.0)
Hemoglobin: 12.8 g/dL (ref 12.0–15.0)
RDW: 13.8 % (ref 11.5–15.5)
WBC: 5.9 10*3/uL (ref 4.0–10.5)

## 2011-08-23 MED ORDER — TECHNETIUM TC 99M TETROFOSMIN IV KIT
10.0000 | PACK | Freq: Once | INTRAVENOUS | Status: AC | PRN
  Administered 2011-08-23: 10 via INTRAVENOUS

## 2011-08-23 MED ORDER — TECHNETIUM TC 99M TETROFOSMIN IV KIT
30.0000 | PACK | Freq: Once | INTRAVENOUS | Status: AC | PRN
  Administered 2011-08-23: 30 via INTRAVENOUS

## 2011-08-24 NOTE — Consult Note (Signed)
NAMESUKAINA, TOOTHAKER NO.:  [id number]  MEDICAL RECORD NO.:  [id number]  LOCATION:  3312                         FACILITY:  [facility]  PHYSICIAN:  Luis Abed, MD, FACCDATE OF BIRTH:  Oct 12, 1946  DATE OF CONSULTATION:  08/22/2011 DATE OF DISCHARGE:                                CONSULTATION   HISTORY:  The patient has been admitted with chest discomfort.  She has been seen by Dr. Verdis Prime over the years.  It is my understanding that she had an MI in 2003, felt to be related to coronary spasm. Cardiac catheterizations in 2007 revealed no marked abnormalities. Catheterizations in 2009 revealed mild coronary artery disease.  A Myoview scan in September, 2010 revealed no ischemia.  She has had increased chest pain recently.  She is admitted with no diagnostic EKG changes.  She does have some anterior T-wave changes which are old.  Her first troponin is less than 0.30.  She does have some intermittent ongoing chest pain in the hospital but does not appear to be unstable with this.  PAST MEDICAL HISTORY:  ALLERGIES:  SULFA.  MEDICATIONS AT HOME: 1. Imdur XR 60 two tablets daily. 2. Zetia 10. 3. Venlafaxine XR 150 daily. 4. Multivitamin. 5. Vitamin D. 6. Pain medicines. 7. Cipro for recent UTI. 8. Carvedilol 12.5 b.i.d. 9. Lipitor 20. 10.Nexium 40. 11.Amlodipine 10. 12.KCL 10 b.i.d.  OTHER MEDICAL PROBLEMS:  See the complete list below.  SOCIAL HISTORY:  She lives in [place] with her husband.  She does not smoke.  She is retired on disability.  FAMILY HISTORY:  There is a family history of coronary artery disease.  REVIEW OF SYSTEMS:  The patient denies fever, chills, headache, sweats, rash, change in vision, change in hearing, cough, nausea, vomiting, urinary symptoms.  All other systems are reviewed and are negative.  PHYSICAL EXAMINATION:  GENERAL: The patient is stable currently. VITAL SIGNS:  Blood pressure is 117/80.  Pulse is 72.   Respirations 16. O2 sat is 98% on room air. HEENT: Head is atraumatic.  There is no jugular venous distention. LUNGS:  Clear.  Respiratory effort is not labored. CARDIAC:  S1 and S2.  There are no clicks or significant murmurs. ABDOMEN:  Soft. EXTREMITIES:  There is no peripheral edema. MUSCULOSKELETAL:  There are no musculoskeletal deformities. SKIN:  There are no skin rashes.  EKG reveals mildly inverted anterior T-waves.  This has been noted in the past.  Chest x-ray reveals no acute findings.  Hemoglobin is 14.6. BUN is 14 with a creatinine of 1.16.  Her troponins in the emergency room were normal.  The first troponin in the hospital is normal.  TSH is on the low side at 0.262.  PROBLEMS: 1. History of hypertension. 2. Depression. 3. Gastroesophageal reflux disease. 4. Hyperlipidemia. 5. Recent urinary tract infection being treated. 6. Abnormal EKG which looks similar to prior EKGs. 7. Coronary artery disease.  The patient by history had a spasm MI in     2003.  She is on nitrates.  She has had mild coronary artery     disease with other catheterizations over time.  Most recent study  was a Myoview in September 2010 with no significant ischemia.  She     is now here with chest pain.  There has been no evidence of ST-     elevation changes to suggest pain related to coronary spasm at this     time.  Since she has had some continued pain in the hospital she is     on IV heparin and is being treated aggressively.  However, there is     no proven ischemia.  Troponins are normal.  I feel it is most     appropriate to watch her today.  If her enzymes continue negative,     we will proceed with in hospital nuclear exercise test tomorrow. 8. Low TSH 0.262.  Consider further workup of her thyroid.     Luis Abed, MD, [facility]     JDK/MEDQ  D:  08/22/2011  T:  08/22/2011  Job:  [id number]  Electronically Signed by Willa Rough MD FACC on 08/24/2011 01:23:47 PM

## 2011-09-03 ENCOUNTER — Other Ambulatory Visit: Payer: Self-pay | Admitting: Internal Medicine

## 2011-09-10 ENCOUNTER — Emergency Department (HOSPITAL_COMMUNITY): Payer: Medicare Other

## 2011-09-10 ENCOUNTER — Encounter: Payer: Self-pay | Admitting: Emergency Medicine

## 2011-09-10 ENCOUNTER — Emergency Department (HOSPITAL_COMMUNITY)
Admission: EM | Admit: 2011-09-10 | Discharge: 2011-09-10 | Disposition: A | Discharge status: Home / Self Care | Payer: Medicare Other | Attending: Emergency Medicine | Admitting: Emergency Medicine

## 2011-09-10 DIAGNOSIS — M25569 Pain in unspecified knee: Secondary | ICD-10-CM | POA: Insufficient documentation

## 2011-09-10 DIAGNOSIS — M545 Low back pain, unspecified: Secondary | ICD-10-CM | POA: Insufficient documentation

## 2011-09-10 DIAGNOSIS — Z9889 Other specified postprocedural states: Secondary | ICD-10-CM | POA: Insufficient documentation

## 2011-09-10 DIAGNOSIS — R51 Headache: Secondary | ICD-10-CM | POA: Insufficient documentation

## 2011-09-10 DIAGNOSIS — W19XXXA Unspecified fall, initial encounter: Secondary | ICD-10-CM

## 2011-09-10 DIAGNOSIS — S0181XA Laceration without foreign body of other part of head, initial encounter: Secondary | ICD-10-CM

## 2011-09-10 DIAGNOSIS — M542 Cervicalgia: Secondary | ICD-10-CM | POA: Insufficient documentation

## 2011-09-10 DIAGNOSIS — S0180XA Unspecified open wound of other part of head, initial encounter: Secondary | ICD-10-CM | POA: Insufficient documentation

## 2011-09-10 DIAGNOSIS — W010XXA Fall on same level from slipping, tripping and stumbling without subsequent striking against object, initial encounter: Secondary | ICD-10-CM | POA: Insufficient documentation

## 2011-09-10 DIAGNOSIS — M25469 Effusion, unspecified knee: Secondary | ICD-10-CM | POA: Insufficient documentation

## 2011-09-10 MED ORDER — ONDANSETRON 4 MG PO TBDP
8.0000 mg | ORAL_TABLET | Freq: Once | ORAL | Status: AC
  Administered 2011-09-10: 8 mg via ORAL
  Filled 2011-09-10: qty 2

## 2011-09-10 MED ORDER — OXYCODONE-ACETAMINOPHEN 5-325 MG PO TABS
1.0000 | ORAL_TABLET | Freq: Once | ORAL | Status: AC
  Administered 2011-09-10: 1 via ORAL
  Filled 2011-09-10: qty 1

## 2011-09-10 MED ORDER — OXYCODONE-ACETAMINOPHEN 5-325 MG PO TABS: 1.0000 | ORAL_TABLET | Freq: Four times a day (QID) | ORAL | Status: AC | PRN

## 2011-09-10 NOTE — ED Notes (Signed)
Patient walking tripped over a turned over rug hit forehead and right side of eye.  Laceration center of forehead bleeding controlled with bandage 1cm and abrasion right eye no bleeding present.  Right knee abrasion 0.25cm irregular shaped pain right knee 2/10 achy. Pedal pulses +2 bilateral.  Headache 4/10 throbbing. Pupils equal and reactive +2 brisk answering and following commands appropriate. Husband at bedside.

## 2011-09-10 NOTE — ED Notes (Signed)
Pt tripped and fell walking out of a store. Pt is c/o right knee pain and has a  Small abrasion to her forehead

## 2011-09-10 NOTE — ED Provider Notes (Addendum)
History     CSN: [id number] Arrival date & time: 09/10/2011  4:22 PM   First MD Initiated Contact with Patient 09/10/11 1640      Chief Complaint  Patient presents with  . Fall    (Consider location/radiation/quality/duration/timing/severity/associated sxs/prior treatment) HPI Comments: Was walking and tripped over a rug in the store  Patient is a 65 y.o. female presenting with fall. The history is provided by the patient.  Fall The accident occurred less than 1 hour ago. The fall occurred while walking. She fell from a height of 1 to 2 ft. She landed on a hard floor. The volume of blood lost was minimal. The point of impact was the head and right knee. The pain is present in the head and right knee. The pain is at a severity of 7/10. The pain is moderate. She was not ambulatory at the scene. There was no drug use involved in the accident. There was no alcohol use involved in the accident. Associated symptoms include headaches. Pertinent negatives include no visual change, no abdominal pain, no nausea, no vomiting, no loss of consciousness and no tingling. The symptoms are aggravated by flexion. Treatment on scene includes a c-collar and a backboard. She has tried nothing for the symptoms. The treatment provided no relief.    No past medical history on file.  Past Surgical History  Procedure Date  . Joint replacement   . Vascular surgery     No family history on file.  History  Substance Use Topics  . Smoking status: Never Smoker   . Smokeless tobacco: Not on file  . Alcohol Use: No    OB History    Grav Para Term Preterm Abortions TAB SAB Ect Mult Living                  Review of Systems  Gastrointestinal: Negative for nausea, vomiting and abdominal pain.  Neurological: Positive for headaches. Negative for tingling and loss of consciousness.  All other systems reviewed and are negative.    Allergies  Sulfa drugs cross reactors  Home Medications  No current  outpatient prescriptions on file.  BP 120/67  Pulse 63  Temp(Src) 98.5 F (36.9 C) (Oral)  SpO2 97%  Physical Exam  Nursing note and vitals reviewed. Constitutional: She is oriented to person, place, and time. She appears well-developed and well-nourished. No distress.  HENT:  Head: Normocephalic. Head is with laceration.    Eyes: EOM are normal. Pupils are equal, round, and reactive to light.  Cardiovascular: Normal rate, regular rhythm, normal heart sounds and intact distal pulses.  Exam reveals no friction rub.   No murmur heard. Pulmonary/Chest: Effort normal and breath sounds normal. She has no wheezes. She has no rales.  Abdominal: Soft. Bowel sounds are normal. She exhibits no distension. There is no tenderness. There is no rebound and no guarding.  Musculoskeletal: Normal range of motion. She exhibits no tenderness.       Right knee: She exhibits swelling and bony tenderness. She exhibits no effusion and no laceration. tenderness found.       Cervical back: Normal.       Lumbar back: She exhibits tenderness. She exhibits normal range of motion.       Legs:      No edema  Neurological: She is alert and oriented to person, place, and time. No cranial nerve deficit.  Skin: Skin is warm and dry. No rash noted.  Psychiatric: She has a normal mood and  affect. Her behavior is normal.    ED Course  Procedures (including critical care time)  Labs Reviewed - No data to display Ct Head Wo Contrast  09/10/2011  *RADIOLOGY REPORT*  Clinical Data:  Larey Seat and hit head.  Head pain.  Neck pain.  CT HEAD WITHOUT CONTRAST CT CERVICAL SPINE WITHOUT CONTRAST  Technique:  Multidetector CT imaging of the head and cervical spine was performed following the standard protocol without intravenous contrast.  Multiplanar CT image reconstructions of the cervical spine were also generated.  Comparison:  07/19/2011  CT HEAD  Findings: There is no evidence for acute infarction, intracranial hemorrhage,  mass lesion, hydrocephalus, or extra-axial fluid. There is no atrophy or white matter disease.  Calvarium is intact. There is a sessile radiodense structure with cross-sectional measurements of 25 x 6 mm arising from the right posterior frontal bone and/or parietal bone consistent with a incidental osteoma, stable from priors.  There is no involvement of the inner table.  The paranasal sinuses are clear.  Mastoid air cells are clear. There is no skull fracture or or significant scalp hematoma.  IMPRESSION: Negative CT head.  No skull fracture or hemorrhage.  No change from prior normal study.  CT CERVICAL SPINE  Findings: There is no evidence for cervical spine fracture or traumatic subluxation.  No prevertebral soft tissue swelling is seen.  Disc space narrowing is present at C4-5, as C5-6 and to a lesser  degree at C6-7.  No critical spinal stenosis is observed. Lung apices show early changes of COPD.  No neck masses are present.  Scattered cervical lymphadenopathy appears largely reactive with no lymph nodes greater than 1 cm short axis.  IMPRESSION: Cervical spondylosis.  No visible fracture or traumatic subluxation.  Original Report Authenticated By: Elsie Stain, M.D.   Ct Cervical Spine Wo Contrast  09/10/2011  *RADIOLOGY REPORT*  Clinical Data:  Larey Seat and hit head.  Head pain.  Neck pain.  CT HEAD WITHOUT CONTRAST CT CERVICAL SPINE WITHOUT CONTRAST  Technique:  Multidetector CT imaging of the head and cervical spine was performed following the standard protocol without intravenous contrast.  Multiplanar CT image reconstructions of the cervical spine were also generated.  Comparison:  07/19/2011  CT HEAD  Findings: There is no evidence for acute infarction, intracranial hemorrhage, mass lesion, hydrocephalus, or extra-axial fluid. There is no atrophy or white matter disease.  Calvarium is intact. There is a sessile radiodense structure with cross-sectional measurements of 25 x 6 mm arising from the right  posterior frontal bone and/or parietal bone consistent with a incidental osteoma, stable from priors.  There is no involvement of the inner table.  The paranasal sinuses are clear.  Mastoid air cells are clear. There is no skull fracture or or significant scalp hematoma.  IMPRESSION: Negative CT head.  No skull fracture or hemorrhage.  No change from prior normal study.  CT CERVICAL SPINE  Findings: There is no evidence for cervical spine fracture or traumatic subluxation.  No prevertebral soft tissue swelling is seen.  Disc space narrowing is present at C4-5, as C5-6 and to a lesser  degree at C6-7.  No critical spinal stenosis is observed. Lung apices show early changes of COPD.  No neck masses are present.  Scattered cervical lymphadenopathy appears largely reactive with no lymph nodes greater than 1 cm short axis.  IMPRESSION: Cervical spondylosis.  No visible fracture or traumatic subluxation.  Original Report Authenticated By: Elsie Stain, M.D.   Dg Knee Complete  4 Views Right  09/10/2011  *RADIOLOGY REPORT*  Clinical Data: Right knee pain, fall  RIGHT KNEE - COMPLETE 4+ VIEW  Comparison: None.  Findings: Evidence of right total knee arthroplasty noted.  No hardware failure evident.  Bones are mildly osteopenic.  No fracture or dislocation.  Trace suprapatellar fluid noted.  IMPRESSION: No acute osseous abnormality or evidence for hardware failure.  Original Report Authenticated By: Harrel Lemon, M.D.   LACERATION REPAIR Performed by: Gwyneth Sprout Authorized byGwyneth Sprout Consent: Verbal consent obtained. Risks and benefits: risks, benefits and alternatives were discussed Consent given by: patient Patient identity confirmed: provided demographic data Prepped and Draped in normal sterile fashion Wound explored  Laceration Location: forehead  Laceration Length: 2cm  No Foreign Bodies seen or palpated  Anesthesia: local infiltration  Irrigation method: scrub with  saline Amount of cleaning: standard  Skin closure: dermabond  Patient tolerance: Patient tolerated the procedure well with no immediate complications.   No diagnosis found.    MDM   Pt with mechanical fall and injury to head and states head hurts and no localized neck pain.  No other injuries other than pain over the right knee.  Will repair wound with dermabond.  Tetanus UTd.   6:09 PM Films neg.  C-spine cleared and pt d/ced home.      Gwyneth Sprout, MD 09/10/11 1810  Gwyneth Sprout, MD 09/10/11 [phone]

## 2011-09-23 ENCOUNTER — Ambulatory Visit
Admission: RE | Admit: 2011-09-23 | Discharge: 2011-09-23 | Disposition: A | Discharge status: Home / Self Care | Payer: Medicare Other | Source: Ambulatory Visit | Attending: Internal Medicine | Admitting: Internal Medicine

## 2011-09-30 ENCOUNTER — Encounter (HOSPITAL_COMMUNITY): Payer: Self-pay

## 2011-09-30 ENCOUNTER — Other Ambulatory Visit: Payer: Self-pay

## 2011-09-30 ENCOUNTER — Inpatient Hospital Stay (HOSPITAL_COMMUNITY)
Admission: EM | Admit: 2011-09-30 | Discharge: 2011-10-06 | DRG: 392 | Disposition: A | Discharge status: Home / Self Care | Payer: Medicare Other | Attending: Internal Medicine | Admitting: Internal Medicine

## 2011-09-30 ENCOUNTER — Emergency Department (HOSPITAL_COMMUNITY): Payer: Medicare Other

## 2011-09-30 DIAGNOSIS — E785 Hyperlipidemia, unspecified: Secondary | ICD-10-CM | POA: Diagnosis present

## 2011-09-30 DIAGNOSIS — I10 Essential (primary) hypertension: Secondary | ICD-10-CM | POA: Diagnosis present

## 2011-09-30 DIAGNOSIS — E7800 Pure hypercholesterolemia, unspecified: Secondary | ICD-10-CM | POA: Diagnosis present

## 2011-09-30 DIAGNOSIS — K5732 Diverticulitis of large intestine without perforation or abscess without bleeding: Principal | ICD-10-CM | POA: Diagnosis present

## 2011-09-30 DIAGNOSIS — E876 Hypokalemia: Secondary | ICD-10-CM | POA: Diagnosis present

## 2011-09-30 DIAGNOSIS — I4949 Other premature depolarization: Secondary | ICD-10-CM | POA: Diagnosis present

## 2011-09-30 DIAGNOSIS — Z882 Allergy status to sulfonamides status: Secondary | ICD-10-CM

## 2011-09-30 DIAGNOSIS — E119 Type 2 diabetes mellitus without complications: Secondary | ICD-10-CM | POA: Diagnosis present

## 2011-09-30 DIAGNOSIS — K219 Gastro-esophageal reflux disease without esophagitis: Secondary | ICD-10-CM | POA: Diagnosis present

## 2011-09-30 DIAGNOSIS — Z79899 Other long term (current) drug therapy: Secondary | ICD-10-CM

## 2011-09-30 DIAGNOSIS — I2510 Atherosclerotic heart disease of native coronary artery without angina pectoris: Secondary | ICD-10-CM | POA: Diagnosis present

## 2011-09-30 DIAGNOSIS — K5792 Diverticulitis of intestine, part unspecified, without perforation or abscess without bleeding: Secondary | ICD-10-CM | POA: Diagnosis present

## 2011-09-30 HISTORY — DX: Atherosclerotic heart disease of native coronary artery without angina pectoris: I25.10

## 2011-09-30 HISTORY — DX: Essential (primary) hypertension: I10

## 2011-09-30 HISTORY — DX: Pure hypercholesterolemia, unspecified: E78.00

## 2011-09-30 HISTORY — DX: Gastro-esophageal reflux disease without esophagitis: K21.9

## 2011-09-30 LAB — URINALYSIS, ROUTINE W REFLEX MICROSCOPIC
Bilirubin Urine: NEGATIVE
Glucose, UA: NEGATIVE mg/dL
Nitrite: NEGATIVE
Specific Gravity, Urine: 1.035 — ABNORMAL HIGH (ref 1.005–1.030)
pH: 6.5 (ref 5.0–8.0)

## 2011-09-30 LAB — CBC
HCT: 42.4 % (ref 36.0–46.0)
Hemoglobin: 14.8 g/dL (ref 12.0–15.0)
MCV: 91.6 fL (ref 78.0–100.0)
RBC: 4.63 MIL/uL (ref 3.87–5.11)
RDW: 13.8 % (ref 11.5–15.5)
WBC: 8.9 10*3/uL (ref 4.0–10.5)

## 2011-09-30 LAB — BASIC METABOLIC PANEL
CO2: 31 mEq/L (ref 19–32)
Chloride: 102 mEq/L (ref 96–112)
Creatinine, Ser: 0.97 mg/dL (ref 0.50–1.10)
Potassium: 3 mEq/L — ABNORMAL LOW (ref 3.5–5.1)

## 2011-09-30 LAB — URINE MICROSCOPIC-ADD ON

## 2011-09-30 MED ORDER — CIPROFLOXACIN IN D5W 400 MG/200ML IV SOLN
400.0000 mg | Freq: Once | INTRAVENOUS | Status: AC
  Administered 2011-10-01: 400 mg via INTRAVENOUS
  Filled 2011-09-30: qty 200

## 2011-09-30 MED ORDER — IOHEXOL 300 MG/ML  SOLN
100.0000 mL | Freq: Once | INTRAMUSCULAR | Status: AC | PRN
  Administered 2011-09-30: 100 mL via INTRAVENOUS

## 2011-09-30 MED ORDER — MORPHINE SULFATE 4 MG/ML IJ SOLN
4.0000 mg | Freq: Once | INTRAMUSCULAR | Status: AC
  Administered 2011-09-30: 4 mg via INTRAVENOUS
  Filled 2011-09-30: qty 1

## 2011-09-30 MED ORDER — POTASSIUM CHLORIDE 10 MEQ/100ML IV SOLN
10.0000 meq | Freq: Once | INTRAVENOUS | Status: AC
  Administered 2011-10-01: 10 meq via INTRAVENOUS
  Filled 2011-09-30: qty 400
  Filled 2011-09-30: qty 100

## 2011-09-30 MED ORDER — MORPHINE SULFATE 2 MG/ML IJ SOLN
2.0000 mg | Freq: Once | INTRAMUSCULAR | Status: AC
  Administered 2011-09-30: 2 mg via INTRAVENOUS
  Filled 2011-09-30: qty 1

## 2011-09-30 MED ORDER — ONDANSETRON HCL 4 MG/2ML IJ SOLN
4.0000 mg | Freq: Once | INTRAMUSCULAR | Status: AC
  Administered 2011-09-30: 4 mg via INTRAVENOUS
  Filled 2011-09-30: qty 2

## 2011-09-30 MED ORDER — METRONIDAZOLE IN NACL 5-0.79 MG/ML-% IV SOLN
500.0000 mg | Freq: Once | INTRAVENOUS | Status: AC
  Administered 2011-10-01: 500 mg via INTRAVENOUS
  Filled 2011-09-30: qty 100

## 2011-09-30 MED ORDER — SODIUM CHLORIDE 0.9 % IV SOLN
1000.0000 mL | Freq: Once | INTRAVENOUS | Status: AC
  Administered 2011-10-01: 500 mL via INTRAVENOUS

## 2011-09-30 NOTE — ED Notes (Signed)
Pt transferred to CDU room 2. Family with patient.Report given to [facility]. RN made aware of CT scan.

## 2011-09-30 NOTE — ED Notes (Signed)
Pt presents with 3 day abdominal pain.  Pt reports pain from umbilicus and to bilateral lower abdomen and around to low back.  Pt reports last bowel movement was 1 week ago, pt had endoscopy 1 week ago.  Pt reports her doctor called her in a laxative but pt threw it up. + nausea

## 2011-09-30 NOTE — ED Notes (Signed)
Patient transported to CT 

## 2011-09-30 NOTE — ED Notes (Signed)
CT aware that patient completed contrast. Candace, Secretary made them aware. Will continue to monitor.

## 2011-09-30 NOTE — ED Provider Notes (Signed)
Pt seen with resident She has bilateral lower quad tenderness Will need imaging Pt stable at this time  Joya Gaskins, MD 09/30/11 1729

## 2011-09-30 NOTE — ED Notes (Signed)
Pt placed in a gown, on monitor, with continuous blood pressure and pulse oximetry 

## 2011-09-30 NOTE — ED Notes (Signed)
Complaining of severe abd pain since Sunday night. States she thought she was constipated due to dye for endoscopy. Drank bottle of mag citrate with little to no results. Unable to eat or drink. Pain located bottom part of stomach. Rates pain as 5/10. At worst greater than 10.

## 2011-09-30 NOTE — ED Notes (Signed)
Pt has stared contrast from CT scan. MD aware.

## 2011-09-30 NOTE — ED Notes (Signed)
Pt returned from CT °

## 2011-09-30 NOTE — ED Notes (Signed)
Spoke with patient during hourly rounding. Pt stated that she came to the hospital because she was having lower abdominal pain. Current pain 6 out of 10. Will get an order for pain medication. Bowel sounds are hyperactive. Abdomen is soft but tender to touch in lower quadrants. Will continue to monitor.

## 2011-09-30 NOTE — ED Provider Notes (Signed)
History     CSN: [id number] Arrival date & time: 09/30/2011  1:51 PM   First MD Initiated Contact with Patient 09/30/11 1635      Chief Complaint  Patient presents with  . Abdominal Pain   HPI Comments: Pt reports that she had a barium swallow 1 week ago.  Since that time she has been feeling not quite right.  She reports that, beginning two days ago, she was having a feeling of constipation.  She was having peri-umbilical pain accompanied by bilateral lower abdominal pain. She has also begun having some nausea and vomiting, with inability to keep any food or liquid down. She felt she was constipated, she contacted her primary care provider and requested a laxative. This was called in and she attempted to take it. However she was unable to keep this down, promptly vomiting it.  The patient reports no blood in emesis, no blood in urine but does report some chronic hematochezia, not worsened lately.  No problems with weight loss, no problems with urination.  Patient is a 65 y.o. female presenting with abdominal pain. The history is provided by the patient.  Abdominal Pain The primary symptoms of the illness include abdominal pain, nausea, vomiting and hematochezia. The primary symptoms of the illness do not include fever, fatigue, shortness of breath, diarrhea, hematemesis, dysuria, vaginal discharge or vaginal bleeding. The current episode started 2 days ago. The onset of the illness was gradual. The problem has not changed since onset. The abdominal pain began more than 2 days ago. The abdominal pain is located in the periumbilical region, LLQ and RLQ. The abdominal pain does not radiate. The abdominal pain is relieved by nothing.  The vomiting began 2 days ago. Vomiting occurs 2 to 5 times per day. The emesis contains stomach contents and undigested food.  The hematochezia began 2 days ago. The hematochezia has occurred 1 time per day. The hematochezia is a chronic problem.  The patient states  that she believes she is currently not pregnant. The patient has had a change in bowel habit. Symptoms associated with the illness do not include back pain. Significant associated medical issues include GERD. Significant associated medical issues do not include gallstones.    Past Medical History  Diagnosis Date  . Reflux   . Hypercholesteremia   . Hypertension   . Coronary artery disease     Past Surgical History  Procedure Date  . Joint replacement   . Vascular surgery   . Coronary stent placement     No family history on file.  History  Substance Use Topics  . Smoking status: Never Smoker   . Smokeless tobacco: Not on file  . Alcohol Use: No    OB History    Grav Para Term Preterm Abortions TAB SAB Ect Mult Living                  Review of Systems  Constitutional: Positive for appetite change. Negative for fever and fatigue.  HENT: Negative for trouble swallowing.   Respiratory: Negative for shortness of breath.   Gastrointestinal: Positive for nausea, vomiting, abdominal pain, blood in stool and hematochezia. Negative for diarrhea and hematemesis.  Genitourinary: Negative for dysuria, vaginal bleeding and vaginal discharge.  Musculoskeletal: Negative for back pain, arthralgias and gait problem.  Skin: Negative for pallor.  Neurological: Negative.   Hematological: Negative.   Psychiatric/Behavioral: Negative.     Allergies  Peanut-containing drug products and Sulfa drugs cross reactors  Home Medications  Current Outpatient Rx  Name Route Sig Dispense Refill  . AMLODIPINE BESYLATE 10 MG PO TABS Oral Take 10 mg by mouth daily.      . ATORVASTATIN CALCIUM 20 MG PO TABS Oral Take 20 mg by mouth daily.      Marland Kitchen ESOMEPRAZOLE MAGNESIUM 40 MG PO CPDR Oral Take 40 mg by mouth 2 (two) times daily.      Marland Kitchen EZETIMIBE 10 MG PO TABS Oral Take 10 mg by mouth daily.      Marland Kitchen HYDROCODONE-ACETAMINOPHEN 7.5-750 MG PO TABS Oral Take 1 tablet by mouth every 6 (six) hours as  needed.      Marland Kitchen LACTULOSE 10 GM/15ML PO SOLN Oral Take 20 g by mouth 2 (two) times daily as needed. For constipation     . OXYBUTYNIN CHLORIDE ER 5 MG PO TB24 Oral Take 5 mg by mouth daily.      Marland Kitchen POTASSIUM CHLORIDE CR 10 MEQ PO CPCR Oral Take 10 mEq by mouth daily.      . TRAMADOL HCL 50 MG PO TABS Oral Take 50 mg by mouth 3 (three) times daily as needed. Maximum dose= 8 tablets per day for pain    . VENLAFAXINE HCL 150 MG PO CP24 Oral Take 150 mg by mouth at bedtime.        BP 151/72  Pulse 81  Temp(Src) 98.2 F (36.8 C) (Oral)  Resp 16  SpO2 96%  Physical Exam  Constitutional: She is oriented to person, place, and time. She appears well-developed and well-nourished. No distress.  HENT:  Head: Normocephalic and atraumatic.  Eyes: EOM are normal.  Neck: Normal range of motion. Neck supple.  Cardiovascular: Normal rate, regular rhythm and normal heart sounds.   Pulmonary/Chest: Effort normal and breath sounds normal.  Abdominal: Soft. Normal appearance. Bowel sounds are decreased. There is no hepatosplenomegaly. There is tenderness in the right lower quadrant, periumbilical area, suprapubic area and left lower quadrant. There is no rigidity, no rebound, no guarding, no CVA tenderness and negative Murphy's sign. No hernia.  Musculoskeletal: Normal range of motion.  Neurological: She is alert and oriented to person, place, and time.  Skin: Skin is warm and dry.  Psychiatric: She has a normal mood and affect. Her behavior is normal. Thought content normal.    ED Course  Procedures (including critical care time)  Labs Reviewed  BASIC METABOLIC PANEL - Abnormal; Notable for the following:    Potassium 3.0 (*)    Glucose, Bld 113 (*)    GFR calc non Af Amer 60 (*)    GFR calc Af Amer 70 (*)    All other components within normal limits  URINALYSIS, ROUTINE W REFLEX MICROSCOPIC - Abnormal; Notable for the following:    Color, Urine AMBER (*) BIOCHEMICALS MAY BE AFFECTED BY COLOR    Specific Gravity, Urine 1.035 (*)    Ketones, ur 40 (*)    Protein, ur 100 (*)    Leukocytes, UA SMALL (*)    All other components within normal limits  URINE MICROSCOPIC-ADD ON - Abnormal; Notable for the following:    Squamous Epithelial / LPF FEW (*)    Crystals CA OXALATE CRYSTALS (*)    All other components within normal limits  CBC  TROPONIN I   No results found.   No diagnosis found.   Date: 09/30/2011  Rate: 95  Rhythm: normal sinus rhythm and premature ventricular contractions (PVC)  QRS Axis: normal  Intervals: normal  ST/T Wave abnormalities: ST  depressions laterally  Conduction Disutrbances:none  Narrative Interpretation: Mild depressions in V3-V6, otherwise no acute findings.  Old EKG Reviewed: ST depression appears to be new     MDM  No evidence of acute abomon.  Tx pain with IV morphine, give IVF as pt has evidence of dehydration, and treat nausea as needed.  As pt still awaiting CT abdomen, will transfer to CDU while waiting.        Majel Homer, MD 09/30/11 [phone]

## 2011-10-01 ENCOUNTER — Other Ambulatory Visit: Payer: Self-pay

## 2011-10-01 ENCOUNTER — Encounter (HOSPITAL_COMMUNITY): Payer: Self-pay | Admitting: Internal Medicine

## 2011-10-01 DIAGNOSIS — K5732 Diverticulitis of large intestine without perforation or abscess without bleeding: Secondary | ICD-10-CM

## 2011-10-01 DIAGNOSIS — I10 Essential (primary) hypertension: Secondary | ICD-10-CM | POA: Diagnosis present

## 2011-10-01 DIAGNOSIS — K5792 Diverticulitis of intestine, part unspecified, without perforation or abscess without bleeding: Secondary | ICD-10-CM | POA: Diagnosis present

## 2011-10-01 LAB — COMPREHENSIVE METABOLIC PANEL
Alkaline Phosphatase: 63 U/L (ref 39–117)
BUN: 9 mg/dL (ref 6–23)
CO2: 29 mEq/L (ref 19–32)
Calcium: 9 mg/dL (ref 8.4–10.5)
GFR calc Af Amer: 81 mL/min — ABNORMAL LOW (ref >90)
GFR calc non Af Amer: 70 mL/min — ABNORMAL LOW (ref >90)
Glucose, Bld: 91 mg/dL (ref 70–99)
Total Protein: 6.9 g/dL (ref 6.0–8.3)

## 2011-10-01 LAB — GLUCOSE, CAPILLARY
Glucose-Capillary: 129 mg/dL — ABNORMAL HIGH (ref 70–99)
Glucose-Capillary: 91 mg/dL (ref 70–99)
Glucose-Capillary: 86 mg/dL (ref 70–99)
Glucose-Capillary: 103 mg/dL — ABNORMAL HIGH (ref 70–99)
Glucose-Capillary: 95 mg/dL (ref 70–99)

## 2011-10-01 LAB — CBC
HCT: 40.6 % (ref 36.0–46.0)
Hemoglobin: 13.5 g/dL (ref 12.0–15.0)
MCHC: 33.3 g/dL (ref 30.0–36.0)
RDW: 14.1 % (ref 11.5–15.5)
WBC: 6.9 10*3/uL (ref 4.0–10.5)

## 2011-10-01 LAB — MAGNESIUM
Magnesium: 2.3 mg/dL (ref 1.5–2.5)
Magnesium: 1.9 mg/dL (ref 1.5–2.5)

## 2011-10-01 MED ORDER — POTASSIUM CHLORIDE IN NACL 20-0.45 MEQ/L-% IV SOLN
INTRAVENOUS | Status: DC
  Administered 2011-10-01 – 2011-10-02 (×2): via INTRAVENOUS
  Filled 2011-10-01 (×6): qty 1000

## 2011-10-01 MED ORDER — ACETAMINOPHEN 650 MG RE SUPP: 650.0000 mg | Freq: Four times a day (QID) | RECTAL | Status: DC | PRN

## 2011-10-01 MED ORDER — MORPHINE SULFATE 2 MG/ML IJ SOLN
1.0000 mg | INTRAMUSCULAR | Status: DC | PRN
  Administered 2011-10-01 – 2011-10-02 (×4): 1 mg via INTRAVENOUS
  Filled 2011-10-01 (×4): qty 1

## 2011-10-01 MED ORDER — POTASSIUM CHLORIDE 10 MEQ/100ML IV SOLN
10.0000 meq | Freq: Once | INTRAVENOUS | Status: AC
  Administered 2011-10-01: 10 meq via INTRAVENOUS
  Filled 2011-10-01: qty 100

## 2011-10-01 MED ORDER — ONDANSETRON HCL 4 MG PO TABS
4.0000 mg | ORAL_TABLET | Freq: Four times a day (QID) | ORAL | Status: DC | PRN
  Administered 2011-10-02: 4 mg via ORAL
  Filled 2011-10-01: qty 1

## 2011-10-01 MED ORDER — ACETAMINOPHEN 325 MG PO TABS
650.0000 mg | ORAL_TABLET | Freq: Four times a day (QID) | ORAL | Status: DC | PRN
  Administered 2011-10-03: 650 mg via ORAL
  Filled 2011-10-01 (×2): qty 2

## 2011-10-01 MED ORDER — ONDANSETRON HCL 4 MG/2ML IJ SOLN
4.0000 mg | Freq: Four times a day (QID) | INTRAMUSCULAR | Status: DC | PRN
Start: 2011-10-01 — End: 2011-10-06
  Administered 2011-10-05: 4 mg via INTRAVENOUS
  Filled 2011-10-01 (×2): qty 2

## 2011-10-01 MED ORDER — POTASSIUM CHLORIDE 10 MEQ/100ML IV SOLN
10.0000 meq | INTRAVENOUS | Status: AC
  Administered 2011-10-01 (×4): 10 meq via INTRAVENOUS
  Filled 2011-10-01 (×2): qty 100

## 2011-10-01 MED ORDER — METRONIDAZOLE IN NACL 5-0.79 MG/ML-% IV SOLN
500.0000 mg | Freq: Three times a day (TID) | INTRAVENOUS | Status: DC
  Administered 2011-10-01 – 2011-10-04 (×9): 500 mg via INTRAVENOUS
  Filled 2011-10-01 (×11): qty 100

## 2011-10-01 MED ORDER — CIPROFLOXACIN IN D5W 400 MG/200ML IV SOLN
400.0000 mg | Freq: Two times a day (BID) | INTRAVENOUS | Status: DC
  Administered 2011-10-01 – 2011-10-04 (×6): 400 mg via INTRAVENOUS
  Filled 2011-10-01 (×7): qty 200

## 2011-10-01 NOTE — ED Provider Notes (Signed)
I have personally seen and examined the patient.  I have discussed the plan of care with the resident.  I have reviewed the documentation on PMH/FH/Soc. History.  I have reviewed the documentation of the resident and agree.   I have reviewed and agree with the ECG interpretation(s) documented by the resident.   Joya Gaskins, MD 10/01/11 2203

## 2011-10-01 NOTE — ED Notes (Signed)
Pt denies chest pain or sob.  HR remains irregular.

## 2011-10-01 NOTE — Consult Note (Signed)
Reason for Consult:Diverticulitis  Referring Physician: [name] is an 65 y.o. female.  HPI: 65 year-old female with history of hypertension coronary disease has been experiencing abdominal pain for the last 3 days with nausea and vomiting. The last time she had moved her bowels she is almost 4-5 days ago. The abdominal pain is in the lower quadrants and is going across and persistent. Due to worsening of the pain [name]. CT abdomen and pelvis revealed descending colitis versus diverticulitis which also involves the sigmoid colon.  Her transverse colon is quite redundant and drapes down into the pelvis. In addition patient is a mild hypokalemia with some frequent PVCs. Patient has had a barium swallow week ago for difficulty swallowing which was found to be normal.  The barium is retained in her colon, which makes interpretation of the CT scan quite difficult.  We are asked for surgical consultation.   Past Medical History  Diagnosis Date  . Reflux   . Hypercholesteremia   . Hypertension   . Coronary artery disease   . Diabetes mellitus     Past Surgical History  Procedure Date  . Joint replacement   . Vascular surgery   . Coronary stent placement   . Abdominal hysterectomy     History reviewed. No pertinent family history.  Social History:  reports that she has never smoked. She does not have any smokeless tobacco history on file. She reports that she does not drink alcohol or use illicit drugs.  Allergies:  Allergies  Allergen Reactions  . Peanut-Containing Drug Products     swelling  . Sulfa Drugs Cross Reactors     Chest pain    Medications: I have reviewed the patient's current medications.  On Cipro/Flagyl per hospitalist.  Results for orders placed during the hospital encounter of 09/30/11 (from the past 48 hour(s))  BASIC METABOLIC PANEL     Status: Abnormal   Collection Time   09/30/11  5:32 PM      Component Value Range Comment   Sodium 143  135 - 145 (mEq/L)    Potassium 3.0 (*) 3.5 - 5.1 (mEq/L)    Chloride 102  96 - 112 (mEq/L)    CO2 31  19 - 32 (mEq/L)    Glucose, Bld 113 (*) 70 - 99 (mg/dL)    BUN 12  6 - 23 (mg/dL)    Creatinine, Ser 1.61  0.50 - 1.10 (mg/dL)    Calcium 09.6  8.4 - 10.5 (mg/dL)    GFR calc non Af Amer 60 (*) >90 (mL/min)    GFR calc Af Amer 70 (*) >90 (mL/min)   CBC     Status: Normal   Collection Time   09/30/11  5:32 PM      Component Value Range Comment   WBC 8.9  4.0 - 10.5 (K/uL)    RBC 4.63  3.87 - 5.11 (MIL/uL)    Hemoglobin 14.8  12.0 - 15.0 (g/dL)    HCT 04.5  40.9 - 81.1 (%)    MCV 91.6  78.0 - 100.0 (fL)    MCH 32.0  26.0 - 34.0 (pg)    MCHC 34.9  30.0 - 36.0 (g/dL)    RDW 91.4  78.2 - 95.6 (%)    Platelets 236  150 - 400 (K/uL)   MAGNESIUM     Status: Normal   Collection Time   09/30/11  5:32 PM      Component Value Range  Comment   Magnesium 2.3  1.5 - 2.5 (mg/dL)   TROPONIN I     Status: Normal   Collection Time   09/30/11  6:17 PM      Component Value Range Comment   Troponin I <0.30  <0.30 (ng/mL)   URINALYSIS, ROUTINE W REFLEX MICROSCOPIC     Status: Abnormal   Collection Time   09/30/11  7:42 PM      Component Value Range Comment   Color, Urine AMBER (*) YELLOW  BIOCHEMICALS MAY BE AFFECTED BY COLOR   APPearance CLEAR  CLEAR     Specific Gravity, Urine 1.035 (*) 1.005 - 1.030     pH 6.5  5.0 - 8.0     Glucose, UA NEGATIVE  NEGATIVE (mg/dL)    Hgb urine dipstick NEGATIVE  NEGATIVE     Bilirubin Urine NEGATIVE  NEGATIVE     Ketones, ur 40 (*) NEGATIVE (mg/dL)    Protein, ur 409 (*) NEGATIVE (mg/dL)    Urobilinogen, UA 1.0  0.0 - 1.0 (mg/dL)    Nitrite NEGATIVE  NEGATIVE     Leukocytes, UA SMALL (*) NEGATIVE    URINE MICROSCOPIC-ADD ON     Status: Abnormal   Collection Time   09/30/11  7:42 PM      Component Value Range Comment   Squamous Epithelial / LPF FEW (*) RARE     WBC, UA 0-2  <3 (WBC/hpf)    Crystals CA OXALATE CRYSTALS (*) NEGATIVE      Urine-Other MUCOUS PRESENT       Ct Abdomen Pelvis W Contrast  09/30/2011  *RADIOLOGY REPORT*  Clinical Data: Low abdominal pain with nausea and vomiting.  CT ABDOMEN AND PELVIS WITH CONTRAST  Technique:  Multidetector CT imaging of the abdomen and pelvis was performed following the standard protocol during bolus administration of intravenous contrast.  Contrast: OMNIPAQUE IOHEXOL 300 MG/ML IV SOLN  Comparison: Esophagram 09/23/2011.  Abdominal pelvic CT 06/25/2010.  Findings: There is mild atelectasis at the right lung base.  No pleural effusion is present.  The liver, spleen, gallbladder, pancreas, adrenal glands and kidneys demonstrate no significant findings.  There is retained barium within the colon from the esophagram of 1 week ago.  This creates moderate artifact within the pelvis.  There is mild proximal colonic distension.  The descending and sigmoid colon demonstrates moderate circumferential wall thickening extending from the iliac crest into the lower pelvis (images 56 - 73.    There is a prominent oval collection of barium on image 73 which may represent a diverticulum.  It is difficult to exclude a contained perforation in this area.  No unopacified extraluminal fluid collections are identified.  There is no free intraperitoneal air.  A small amount of free pelvic fluid is present.  The uterus is surgically absent.  There is no adnexal mass.  The urinary bladder appears normal.  Mild lumbar spondylosis and aortic atherosclerosis appear unchanged.  IMPRESSION:  1.  Abnormal retention of barium with mild distension of the colon proximal to circumferential thickening of the walls of the distal descending and sigmoid colon.  Appearance is most consistent with diverticulitis or colitis.  Obstructing neoplasm is less likely but not completely excluded. 2.  A contained perforation containing barium adjacent to the sigmoid colon is difficult to exclude, although this may reflect barium within a  diverticulum.  Because of streak artifact from the barium, this is difficult to evaluate.  No drainable fluid collections are identified.  Imaging followup  after therapy and evacuation of the barium is recommended.  Original Report Authenticated By: Gerrianne Scale, M.D.    Review of Systems  HENT: Positive for sore throat.   Gastrointestinal: Positive for nausea, vomiting, abdominal pain and constipation.   Blood pressure 128/81, pulse 87, temperature 98.2 F (36.8 C), temperature source Oral, resp. rate 12, SpO2 96.00%. Physical Exam WDWN in NAD HEENT:  EOMI, sclera anicteric Neck:  No masses, no thyromegaly Lungs:  CTA bilaterally; normal respiratory effort CV:  Regular rate and rhythm; no murmurs Abd:  +bowel sounds, soft, mildly tender in pelvis - lower midline, no masses Ext:  Well-perfused; no edema Skin:  Warm, dry; no sign of jaundice  Assessment/Plan: Sigmoid diverticulitis - no obvious peritoneal signs; CT scan difficulty to interpret due to retained barium.    Recs:  Would treat with IV antibiotics, IV hydration, bowel rest.  If clinical status worsens, she may require urgent sigmoid colectomy with possible colostomy.  We will follow with you.    Ashantee Deupree K. 10/01/2011, 2:24 AM

## 2011-10-01 NOTE — Progress Notes (Signed)
Subjective: Feeling better. No acute distress. Patient denies chest pain, shortness of breath, fever, nausea and vomiting. She's is still complaining of abdominal discomfort, especially left lower quadrant.  Objective: Vital signs in last 24 hours: Temp:  [97.7 F (36.5 C)-99.5 F (37.5 C)] 99.5 F (37.5 C) (12/06 0821) Pulse Rate:  [65-90] 77  (12/06 0821) Resp:  [12-16] 16  (12/06 0821) BP: (124-165)/(72-105) 130/81 mmHg (12/06 0821) SpO2:  [96 %-100 %] 96 % (12/06 0821) Weight:  [64.5 kg (142 lb 3.2 oz)-64.501 kg (142 lb 3.2 oz)] 142 lb 3.2 oz (64.5 kg) (12/06 0258) Weight change:        Physical Exam: General: Alert, awake, oriented x3, in no acute distress. HEENT: No bruits, no goiter. Heart: Regular rate and rhythm, without murmurs, rubs, gallops. Lungs: Clear to auscultation bilaterally. Abdomen: Soft, tender to palpation in the left lower quadrant area and also some soreness/rebound on the right lower quadrant, positive bowel sounds. No guarding. Extremities: No clubbing cyanosis or edema with positive pedal pulses. Neuro: Grossly intact, nonfocal.   Lab Results: Basic Metabolic Panel:  Basename 10/01/11 0900 09/30/11 1732  NA 139 143  K 3.5 3.0*  CL 103 102  CO2 29 31  GLUCOSE 91 113*  BUN 9 12  CREATININE 0.86 0.97  CALCIUM 9.0 10.5  MG 1.9 2.3  PHOS -- --   Liver Function Tests:  Basename 10/01/11 0900  AST 17  ALT 14  ALKPHOS 63  BILITOT 0.8  PROT 6.9  ALBUMIN 3.4*   CBC:  Basename 10/01/11 0900 09/30/11 1732  WBC 6.9 8.9  NEUTROABS -- --  HGB 13.5 14.8  HCT 40.6 42.4  MCV 93.3 91.6  PLT 203 236   Cardiac Enzymes:  Basename 09/30/11 1817  CKTOTAL --  CKMB --  CKMBINDEX --  TROPONINI <0.30   CBG:  Basename 10/01/11 0820 10/01/11 0421  GLUCAP 91 129*   Misc. Labs:  No results found for this or any previous visit (from the past 240 hour(s)).  Studies/Results: Ct Abdomen Pelvis W Contrast  09/30/2011  *RADIOLOGY REPORT*   Clinical Data: Low abdominal pain with nausea and vomiting.  CT ABDOMEN AND PELVIS WITH CONTRAST  Technique:  Multidetector CT imaging of the abdomen and pelvis was performed following the standard protocol during bolus administration of intravenous contrast.  Contrast: OMNIPAQUE IOHEXOL 300 MG/ML IV SOLN  Comparison: Esophagram 09/23/2011.  Abdominal pelvic CT 06/25/2010.  Findings: There is mild atelectasis at the right lung base.  No pleural effusion is present.  The liver, spleen, gallbladder, pancreas, adrenal glands and kidneys demonstrate no significant findings.  There is retained barium within the colon from the esophagram of 1 week ago.  This creates moderate artifact within the pelvis.  There is mild proximal colonic distension.  The descending and sigmoid colon demonstrates moderate circumferential wall thickening extending from the iliac crest into the lower pelvis (images 56 - 73.    There is a prominent oval collection of barium on image 73 which may represent a diverticulum.  It is difficult to exclude a contained perforation in this area.  No unopacified extraluminal fluid collections are identified.  There is no free intraperitoneal air.  A small amount of free pelvic fluid is present.  The uterus is surgically absent.  There is no adnexal mass.  The urinary bladder appears normal.  Mild lumbar spondylosis and aortic atherosclerosis appear unchanged.  IMPRESSION:  1.  Abnormal retention of barium with mild distension of the colon proximal  to circumferential thickening of the walls of the distal descending and sigmoid colon.  Appearance is most consistent with diverticulitis or colitis.  Obstructing neoplasm is less likely but not completely excluded. 2.  A contained perforation containing barium adjacent to the sigmoid colon is difficult to exclude, although this may reflect barium within a diverticulum.  Because of streak artifact from the barium, this is difficult to evaluate.  No drainable  fluid collections are identified.  Imaging followup after therapy and evacuation of the barium is recommended.  Original Report Authenticated By: Gerrianne Scale, M.D.    Medications: Scheduled Meds:   . sodium chloride  1,000 mL Intravenous Once  . ciprofloxacin  400 mg Intravenous Once  . ciprofloxacin  400 mg Intravenous Q12H  . metronidazole  500 mg Intravenous Once  . metronidazole  500 mg Intravenous Q8H  .  morphine injection  2 mg Intravenous Once  .  morphine injection  4 mg Intravenous Once  . ondansetron (ZOFRAN) IV  4 mg Intravenous Once  . potassium chloride  10 mEq Intravenous Once  . potassium chloride  10 mEq Intravenous Q1 Hr x 5  . potassium chloride  10 mEq Intravenous Once   Continuous Infusions:   . 0.45 % NaCl with KCl 20 mEq / L 100 mL/hr at 10/01/11 0315   PRN Meds:.acetaminophen, acetaminophen, iohexol, morphine, ondansetron (ZOFRAN) IV, ondansetron  Assessment/Plan: 1-Diverticulitis With microperforations seen on CT of abdomen: Will continue n.p.o./by rest status. Continue fluid resuscitation, pain medications and also IV antibiotics. Will replete patient's electrolytes and follow symptoms. Surgery has been consulted due to microperforations; but no indication for surgery at this point yet. Will follow surgery recommendations.  2-HTN (hypertension): A stable. Will use hydralazine as needed while the patient is n.p.o.  3-hypokalemia: Now resolved. Will follow labs in the morning and check a magnesium level.  4-N/V: Currently stable continue use of antiemetics. Eitiology, most likely secondary to problem #1  5-DVT: Will use SCDs.    LOS: 1 day   Toy Eisemann 10/01/2011, 11:02 AM

## 2011-10-01 NOTE — H&P (Signed)
[name] is an 65 y.o. female.   PCP is Dr. Dossie Arbour. Chief Complaint: Abdominal pain. HPI: 65 year-old female with history of hypertension coronary disease has been experiencing abdominal pain for the last 3 days with nausea and vomiting. The last time she had moved her bowels she is almost 4-5 days ago. The abdominal pain is in the lower quadrants and is going across and persistent. Due to worsening of the pain [name]. CT abdomen and pelvis revealed descending colitis versus diverticulitis which also involves sigmoid. In addition patient is a mild hypokalemia with some frequent PVCs. Patient has had a barium swallow week ago for difficulty swallowing which was found to be normal. Patient is admitted months ago for chest pain.  Past Medical History  Diagnosis Date  . Reflux   . Hypercholesteremia   . Hypertension   . Coronary artery disease   . Diabetes mellitus     Past Surgical History  Procedure Date  . Joint replacement   . Vascular surgery   . Coronary stent placement   . Abdominal hysterectomy     History reviewed. No pertinent family history. Social History:  reports that she has never smoked. She does not have any smokeless tobacco history on file. She reports that she does not drink alcohol or use illicit drugs.  Allergies:  Allergies  Allergen Reactions  . Peanut-Containing Drug Products     swelling  . Sulfa Drugs Cross Reactors     Chest pain    Medications Prior to Admission  Medication Dose Route Frequency Provider Last Rate Last Dose  . 0.9 %  sodium chloride infusion  1,000 mL Intravenous Once Majel Homer, MD      . ciprofloxacin (CIPRO) IVPB 400 mg  400 mg Intravenous Once Dorthula Matas, PA   400 mg at 10/01/11 0104  . iohexol (OMNIPAQUE) 300 MG/ML injection 100 mL  100 mL Intravenous Once PRN Medication Radiologist   100 mL at 09/30/11 2326  . metroNIDAZOLE (FLAGYL) IVPB 500 mg  500 mg Intravenous Once Dorthula Matas, PA   500 mg at  10/01/11 0104  . morphine 2 MG/ML injection 2 mg  2 mg Intravenous Once Majel Homer, MD   2 mg at 09/30/11 1819  . morphine 4 MG/ML injection 4 mg  4 mg Intravenous Once Majel Homer, MD   4 mg at 09/30/11 2052  . ondansetron (ZOFRAN) injection 4 mg  4 mg Intravenous Once Majel Homer, MD   4 mg at 09/30/11 1820  . potassium chloride 10 mEq in 100 mL IVPB  10 mEq Intravenous Once Dorthula Matas, PA   10 mEq at 10/01/11 0104   Medications Prior to Admission  Medication Sig Dispense Refill  . amLODipine (NORVASC) 10 MG tablet Take 10 mg by mouth daily.        Marland Kitchen atorvastatin (LIPITOR) 20 MG tablet Take 20 mg by mouth daily.        Marland Kitchen esomeprazole (NEXIUM) 40 MG capsule Take 40 mg by mouth 2 (two) times daily.        Marland Kitchen ezetimibe (ZETIA) 10 MG tablet Take 10 mg by mouth daily.        . traMADol (ULTRAM) 50 MG tablet Take 50 mg by mouth 3 (three) times daily as needed. Maximum dose= 8 tablets per day for pain      . venlafaxine (EFFEXOR-XR) 150 MG 24 hr capsule Take 150 mg by mouth at bedtime.  Results for orders placed during the hospital encounter of 09/30/11 (from the past 48 hour(s))  BASIC METABOLIC PANEL     Status: Abnormal   Collection Time   09/30/11  5:32 PM      Component Value Range Comment   Sodium 143  135 - 145 (mEq/L)    Potassium 3.0 (*) 3.5 - 5.1 (mEq/L)    Chloride 102  96 - 112 (mEq/L)    CO2 31  19 - 32 (mEq/L)    Glucose, Bld 113 (*) 70 - 99 (mg/dL)    BUN 12  6 - 23 (mg/dL)    Creatinine, Ser 1.61  0.50 - 1.10 (mg/dL)    Calcium 09.6  8.4 - 10.5 (mg/dL)    GFR calc non Af Amer 60 (*) >90 (mL/min)    GFR calc Af Amer 70 (*) >90 (mL/min)   CBC     Status: Normal   Collection Time   09/30/11  5:32 PM      Component Value Range Comment   WBC 8.9  4.0 - 10.5 (K/uL)    RBC 4.63  3.87 - 5.11 (MIL/uL)    Hemoglobin 14.8  12.0 - 15.0 (g/dL)    HCT 04.5  40.9 - 81.1 (%)    MCV 91.6  78.0 - 100.0 (fL)    MCH 32.0  26.0 - 34.0 (pg)    MCHC 34.9  30.0 - 36.0 (g/dL)     RDW 91.4  78.2 - 95.6 (%)    Platelets 236  150 - 400 (K/uL)   TROPONIN I     Status: Normal   Collection Time   09/30/11  6:17 PM      Component Value Range Comment   Troponin I <0.30  <0.30 (ng/mL)   URINALYSIS, ROUTINE W REFLEX MICROSCOPIC     Status: Abnormal   Collection Time   09/30/11  7:42 PM      Component Value Range Comment   Color, Urine AMBER (*) YELLOW  BIOCHEMICALS MAY BE AFFECTED BY COLOR   APPearance CLEAR  CLEAR     Specific Gravity, Urine 1.035 (*) 1.005 - 1.030     pH 6.5  5.0 - 8.0     Glucose, UA NEGATIVE  NEGATIVE (mg/dL)    Hgb urine dipstick NEGATIVE  NEGATIVE     Bilirubin Urine NEGATIVE  NEGATIVE     Ketones, ur 40 (*) NEGATIVE (mg/dL)    Protein, ur 213 (*) NEGATIVE (mg/dL)    Urobilinogen, UA 1.0  0.0 - 1.0 (mg/dL)    Nitrite NEGATIVE  NEGATIVE     Leukocytes, UA SMALL (*) NEGATIVE    URINE MICROSCOPIC-ADD ON     Status: Abnormal   Collection Time   09/30/11  7:42 PM      Component Value Range Comment   Squamous Epithelial / LPF FEW (*) RARE     WBC, UA 0-2  <3 (WBC/hpf)    Crystals CA OXALATE CRYSTALS (*) NEGATIVE     Urine-Other MUCOUS PRESENT      Ct Abdomen Pelvis W Contrast  09/30/2011  *RADIOLOGY REPORT*  Clinical Data: Low abdominal pain with nausea and vomiting.  CT ABDOMEN AND PELVIS WITH CONTRAST  Technique:  Multidetector CT imaging of the abdomen and pelvis was performed following the standard protocol during bolus administration of intravenous contrast.  Contrast: OMNIPAQUE IOHEXOL 300 MG/ML IV SOLN  Comparison: Esophagram 09/23/2011.  Abdominal pelvic CT 06/25/2010.  Findings: There is mild atelectasis at the right lung  base.  No pleural effusion is present.  The liver, spleen, gallbladder, pancreas, adrenal glands and kidneys demonstrate no significant findings.  There is retained barium within the colon from the esophagram of 1 week ago.  This creates moderate artifact within the pelvis.  There is mild proximal colonic distension.  The  descending and sigmoid colon demonstrates moderate circumferential wall thickening extending from the iliac crest into the lower pelvis (images 56 - 73.    There is a prominent oval collection of barium on image 73 which may represent a diverticulum.  It is difficult to exclude a contained perforation in this area.  No unopacified extraluminal fluid collections are identified.  There is no free intraperitoneal air.  A small amount of free pelvic fluid is present.  The uterus is surgically absent.  There is no adnexal mass.  The urinary bladder appears normal.  Mild lumbar spondylosis and aortic atherosclerosis appear unchanged.  IMPRESSION:  1.  Abnormal retention of barium with mild distension of the colon proximal to circumferential thickening of the walls of the distal descending and sigmoid colon.  Appearance is most consistent with diverticulitis or colitis.  Obstructing neoplasm is less likely but not completely excluded. 2.  A contained perforation containing barium adjacent to the sigmoid colon is difficult to exclude, although this may reflect barium within a diverticulum.  Because of streak artifact from the barium, this is difficult to evaluate.  No drainable fluid collections are identified.  Imaging followup after therapy and evacuation of the barium is recommended.  Original Report Authenticated By: Gerrianne Scale, M.D.    Review of Systems  Constitutional: Negative.   HENT: Negative.   Eyes: Negative.   Respiratory: Negative.   Cardiovascular: Negative.   Gastrointestinal: Positive for nausea, vomiting and abdominal pain.  Genitourinary: Negative.   Musculoskeletal: Negative.   Skin: Negative.   Neurological: Negative.   Psychiatric/Behavioral: Negative.     Blood pressure 128/81, pulse 87, temperature 98.2 F (36.8 C), temperature source Oral, resp. rate 12, SpO2 96.00%. Physical Exam  Constitutional: She is oriented to person, place, and time. She appears well-developed and  well-nourished. No distress.  HENT:  Head: Normocephalic and atraumatic.  Right Ear: External ear normal.  Left Ear: External ear normal.  Nose: Nose normal.  Mouth/Throat: Oropharynx is clear and moist. No oropharyngeal exudate.  Eyes: Conjunctivae and EOM are normal. Pupils are equal, round, and reactive to light. Right eye exhibits no discharge. Left eye exhibits no discharge. No scleral icterus.  Neck: Normal range of motion. Neck supple. No JVD present.  Cardiovascular: Normal rate, regular rhythm, normal heart sounds and intact distal pulses.   Respiratory: Effort normal and breath sounds normal. No stridor. No respiratory distress. She has no wheezes.  GI: Soft. Bowel sounds are normal. She exhibits no distension. There is tenderness. There is no rebound and no guarding.  Musculoskeletal: Normal range of motion. She exhibits no edema and no tenderness.  Neurological: She is alert and oriented to person, place, and time. She has normal reflexes. No cranial nerve deficit. Coordination normal.  Skin: Skin is warm and dry. No rash noted. She is not diaphoretic. No erythema.  Psychiatric: Her behavior is normal.     Assessment/Plan #1. Descending colon and sigmoid colitis versus diverticulitis. #2. History of hypertension. #3. History of CAD from coronary spasm cardiac catheter was normal. #4. History of hyperlipidemia. #5. History of abnormal EKG presently showing frequent PVCs. #6. Mild hypokalemia.  Plan Admit to telemetry because of  frequent PVCs and mild hypokalemia. For the patient's diverticulitis versus colitis, we'll keep patient n.p.o. and continue Cipro and Flagyl. The CAT scan shows questionable contained perforation. For which I have consulted Dr. Corliss Skains for his opinion. We will replace potassium and recheck in a.m. Check magnesium levels due to frequent PVCs. Gentle hydrate as patient also has mild hyponatremia. Further recommendations as condition  evolves.  Rutherford Alarie N. 10/01/2011, 1:23 AM

## 2011-10-01 NOTE — Plan of Care (Signed)
Problem: Consults Goal: Diabetes Guidelines if Diabetic/Glucose > 140 If diabetic or lab glucose is > 140 mg/dl - Initiate Diabetes/Hyperglycemia Guidelines & Document Interventions  Outcome: Progressing Pt states shes not a diabetic, monitoring cbg Q4 hr d/t pt npo status. Pt has diverticulis

## 2011-10-01 NOTE — ED Notes (Signed)
Run of bigeminy and trigeminy. Denies any chest pain at this time. Pulse 30's with 20-30 second pause.

## 2011-10-01 NOTE — Progress Notes (Signed)
Subjective: Feeling much better already  Objective: Vital signs in last 24 hours: Temp:  [97.7 F (36.5 C)-99.5 F (37.5 C)] 99.5 F (37.5 C) (12/06 0821) Pulse Rate:  [65-90] 77  (12/06 0821) Resp:  [12-16] 16  (12/06 0821) BP: (124-165)/(72-105) 130/81 mmHg (12/06 0821) SpO2:  [96 %-100 %] 96 % (12/06 0821) Weight:  [64.5 kg (142 lb 3.2 oz)-64.501 kg (142 lb 3.2 oz)] 142 lb 3.2 oz (64.5 kg) (12/06 0258)    Intake/Output from previous day:   Intake/Output this shift:    Awake and alert Lungs CTA CV S1S2 ABD soft, minimal tenderness LLQ, no guarding, some BS  Lab Results:   [facility] 09/30/11 1732  WBC 8.9  HGB 14.8  HCT 42.4  PLT 236   BMET  Basename 09/30/11 1732  NA 143  K 3.0*  CL 102  CO2 31  GLUCOSE 113*  BUN 12  CREATININE 0.97  CALCIUM 10.5   PT/INR No results found for this basename: LABPROT:2,INR:2 in the last 72 hours ABG No results found for this basename: PHART:2,PCO2:2,PO2:2,HCO3:2 in the last 72 hours  Studies/Results: Ct Abdomen Pelvis W Contrast  09/30/2011  *RADIOLOGY REPORT*  Clinical Data: Low abdominal pain with nausea and vomiting.  CT ABDOMEN AND PELVIS WITH CONTRAST  Technique:  Multidetector CT imaging of the abdomen and pelvis was performed following the standard protocol during bolus administration of intravenous contrast.  Contrast: OMNIPAQUE IOHEXOL 300 MG/ML IV SOLN  Comparison: Esophagram 09/23/2011.  Abdominal pelvic CT 06/25/2010.  Findings: There is mild atelectasis at the right lung base.  No pleural effusion is present.  The liver, spleen, gallbladder, pancreas, adrenal glands and kidneys demonstrate no significant findings.  There is retained barium within the colon from the esophagram of 1 week ago.  This creates moderate artifact within the pelvis.  There is mild proximal colonic distension.  The descending and sigmoid colon demonstrates moderate circumferential wall thickening extending from the iliac crest into the  lower pelvis (images 56 - 73.    There is a prominent oval collection of barium on image 73 which may represent a diverticulum.  It is difficult to exclude a contained perforation in this area.  No unopacified extraluminal fluid collections are identified.  There is no free intraperitoneal air.  A small amount of free pelvic fluid is present.  The uterus is surgically absent.  There is no adnexal mass.  The urinary bladder appears normal.  Mild lumbar spondylosis and aortic atherosclerosis appear unchanged.  IMPRESSION:  1.  Abnormal retention of barium with mild distension of the colon proximal to circumferential thickening of the walls of the distal descending and sigmoid colon.  Appearance is most consistent with diverticulitis or colitis.  Obstructing neoplasm is less likely but not completely excluded. 2.  A contained perforation containing barium adjacent to the sigmoid colon is difficult to exclude, although this may reflect barium within a diverticulum.  Because of streak artifact from the barium, this is difficult to evaluate.  No drainable fluid collections are identified.  Imaging followup after therapy and evacuation of the barium is recommended.  Original Report Authenticated By: Gerrianne Scale, M.D.    Anti-infectives: Anti-infectives     Start     Dose/Rate Route Frequency Ordered Stop   10/01/11 0315   ciprofloxacin (CIPRO) IVPB 400 mg        400 mg 200 mL/hr over 60 Minutes Intravenous Every 12 hours 10/01/11 0310     10/01/11 0315   metroNIDAZOLE (  FLAGYL) IVPB 500 mg        500 mg 100 mL/hr over 60 Minutes Intravenous Every 8 hours 10/01/11 0310     10/01/11 0000   ciprofloxacin (CIPRO) IVPB 400 mg        400 mg 200 mL/hr over 60 Minutes Intravenous  Once 09/30/11 2354 10/01/11 0405   10/01/11 0000   metroNIDAZOLE (FLAGYL) IVPB 500 mg        500 mg 100 mL/hr over 60 Minutes Intravenous  Once 09/30/11 2354 10/01/11 0204          Assessment/Plan: Descending and sigmoid  colon diverticulitis - continue IV ABX and bowel rest, improving with medical management so far We will F/U   LOS: 1 day    Brasen Bundren E 10/01/2011

## 2011-10-01 NOTE — Progress Notes (Signed)
   CARE MANAGEMENT NOTE 10/01/2011  Patient:  [name], [name]   Account Number:  [id number]  Date Initiated:  10/01/2011  Documentation initiated by:  Onnie Boer  Subjective/Objective Assessment:   PT WAS ADMITTED WITH DIVERTICULITIS     Action/Plan:   PROGRESSION OF CARE AND DISCHARGE PLANNING   Anticipated DC Date:  10/05/2011   Anticipated DC Plan:  HOME/SELF CARE      DC Planning Services  CM consult      Choice offered to / List presented to:             Status of service:  In process, will continue to follow Medicare Important Message given?   (If response is "NO", the following Medicare IM given date fields will be blank) Date Medicare IM given:   Date Additional Medicare IM given:    Discharge Disposition:    Per UR Regulation:  Reviewed for med. necessity/level of care/duration of stay  Comments:  UR COMPLETED 10/01/2011 Onnie Boer, RN, BSN [phone] PT WAS ADMITTED WITH DIVERTICULITIS, WILL F/U ON DC NEEDS

## 2011-10-02 LAB — CBC
Hemoglobin: 11.9 g/dL — ABNORMAL LOW (ref 12.0–15.0)
MCH: 30.7 pg (ref 26.0–34.0)
Platelets: 183 10*3/uL (ref 150–400)
RBC: 3.88 MIL/uL (ref 3.87–5.11)
WBC: 5.6 10*3/uL (ref 4.0–10.5)

## 2011-10-02 LAB — GLUCOSE, CAPILLARY
Glucose-Capillary: 95 mg/dL (ref 70–99)
Glucose-Capillary: 92 mg/dL (ref 70–99)

## 2011-10-02 MED ORDER — POTASSIUM CHLORIDE IN NACL 20-0.45 MEQ/L-% IV SOLN: INTRAVENOUS | Status: DC

## 2011-10-02 MED ORDER — SODIUM CHLORIDE 0.9 % IV SOLN
INTRAVENOUS | Status: DC
  Administered 2011-10-03: 04:00:00 via INTRAVENOUS
  Administered 2011-10-04: 500 mL via INTRAVENOUS
  Administered 2011-10-05: 15:00:00 via INTRAVENOUS

## 2011-10-02 NOTE — Progress Notes (Signed)
Subjective: Less abd pain, some flatus  Objective: Vital signs in last 24 hours: Temp:  [98.7 F (37.1 C)-100 F (37.8 C)] 98.7 F (37.1 C) (12/07 0041) Pulse Rate:  [77-82] 81  (12/07 0041) Resp:  [16-18] 18  (12/07 0041) BP: (112-130)/(59-81) 118/59 mmHg (12/07 0041) SpO2:  [96 %-100 %] 100 % (12/07 0041) Last BM Date: 09/28/11  Intake/Output from previous day: 12/06 0701 - 12/07 0700 In: 3375 [I.V.:2675; IV Piggyback:700] Out: 300 [Urine:300] Intake/Output this shift:    General appearance: alert Resp: clear to auscultation bilaterally GI: Soft, mild tenderness LLQ, no guarding  Lab Results:   Basename 10/02/11 0603 10/01/11 0900  WBC 5.6 6.9  HGB 11.9* 13.5  HCT 36.2 40.6  PLT 183 203   BMET  Basename 10/01/11 0900 09/30/11 1732  NA 139 143  K 3.5 3.0*  CL 103 102  CO2 29 31  GLUCOSE 91 113*  BUN 9 12  CREATININE 0.86 0.97  CALCIUM 9.0 10.5   PT/INR No results found for this basename: LABPROT:2,INR:2 in the last 72 hours ABG No results found for this basename: PHART:2,PCO2:2,PO2:2,HCO3:2 in the last 72 hours  Studies/Results: Ct Abdomen Pelvis W Contrast  09/30/2011  *RADIOLOGY REPORT*  Clinical Data: Low abdominal pain with nausea and vomiting.  CT ABDOMEN AND PELVIS WITH CONTRAST  Technique:  Multidetector CT imaging of the abdomen and pelvis was performed following the standard protocol during bolus administration of intravenous contrast.  Contrast: OMNIPAQUE IOHEXOL 300 MG/ML IV SOLN  Comparison: Esophagram 09/23/2011.  Abdominal pelvic CT 06/25/2010.  Findings: There is mild atelectasis at the right lung base.  No pleural effusion is present.  The liver, spleen, gallbladder, pancreas, adrenal glands and kidneys demonstrate no significant findings.  There is retained barium within the colon from the esophagram of 1 week ago.  This creates moderate artifact within the pelvis.  There is mild proximal colonic distension.  The descending and sigmoid  colon demonstrates moderate circumferential wall thickening extending from the iliac crest into the lower pelvis (images 56 - 73.    There is a prominent oval collection of barium on image 73 which may represent a diverticulum.  It is difficult to exclude a contained perforation in this area.  No unopacified extraluminal fluid collections are identified.  There is no free intraperitoneal air.  A small amount of free pelvic fluid is present.  The uterus is surgically absent.  There is no adnexal mass.  The urinary bladder appears normal.  Mild lumbar spondylosis and aortic atherosclerosis appear unchanged.  IMPRESSION:  1.  Abnormal retention of barium with mild distension of the colon proximal to circumferential thickening of the walls of the distal descending and sigmoid colon.  Appearance is most consistent with diverticulitis or colitis.  Obstructing neoplasm is less likely but not completely excluded. 2.  A contained perforation containing barium adjacent to the sigmoid colon is difficult to exclude, although this may reflect barium within a diverticulum.  Because of streak artifact from the barium, this is difficult to evaluate.  No drainable fluid collections are identified.  Imaging followup after therapy and evacuation of the barium is recommended.  Original Report Authenticated By: Gerrianne Scale, M.D.    Anti-infectives: Anti-infectives     Start     Dose/Rate Route Frequency Ordered Stop   10/01/11 0315   ciprofloxacin (CIPRO) IVPB 400 mg        400 mg 200 mL/hr over 60 Minutes Intravenous Every 12 hours 10/01/11 0310  10/01/11 0315   metroNIDAZOLE (FLAGYL) IVPB 500 mg        500 mg 100 mL/hr over 60 Minutes Intravenous Every 8 hours 10/01/11 0310     10/01/11 0000   ciprofloxacin (CIPRO) IVPB 400 mg        400 mg 200 mL/hr over 60 Minutes Intravenous  Once 09/30/11 2354 10/01/11 0405   10/01/11 0000   metroNIDAZOLE (FLAGYL) IVPB 500 mg        500 mg 100 mL/hr over 60 Minutes  Intravenous  Once 09/30/11 2354 10/01/11 0204          Assessment/Plan: Descending and sigmoid colon diverticulitis - continue IV ABX, start sips of clears   LOS: 2 days    [name] E 10/02/2011

## 2011-10-02 NOTE — Progress Notes (Signed)
Subjective: Feeling better. No acute distress. Significant improvement in her abdominal pain. Passing flatus and tolerating clear liquids diet.  Objective: Vital signs in last 24 hours: Temp:  [98.7 F (37.1 C)-98.9 F (37.2 C)] 98.9 F (37.2 C) (12/07 1449) Pulse Rate:  [77-81] 78  (12/07 1449) Resp:  [18] 18  (12/07 1449) BP: (115-146)/(59-84) 146/84 mmHg (12/07 1449) SpO2:  [98 %-100 %] 100 % (12/07 1449) Weight change:  Last BM Date: 09/28/11  12/06 0701 - 12/07 0700 In: 3375 [I.V.:2675; IV Piggyback:700] Out: 300 [Urine:300] Total I/O In: 360 [P.O.:360] Out: 400 [Urine:400]Physical Exam: General: Alert, awake, oriented x3, in no acute distress. HEENT: No bruits, no goiter. Heart: Regular rate and rhythm, without murmurs, rubs, gallops. Lungs: Clear to auscultation bilaterally. Abdomen: Soft, mild tenderness to palpation in the left lower quadrant area; no rebound, positive bowel sounds. No guarding. Extremities: No clubbing cyanosis or edema with positive pedal pulses. Neuro: Grossly intact, nonfocal.   Lab Results: Basic Metabolic Panel:  Basename 10/01/11 0900 09/30/11 1732  NA 139 143  K 3.5 3.0*  CL 103 102  CO2 29 31  GLUCOSE 91 113*  BUN 9 12  CREATININE 0.86 0.97  CALCIUM 9.0 10.5  MG 1.9 2.3  PHOS -- --   Liver Function Tests:  Basename 10/01/11 0900  AST 17  ALT 14  ALKPHOS 63  BILITOT 0.8  PROT 6.9  ALBUMIN 3.4*   CBC:  Basename 10/02/11 0603 10/01/11 0900  WBC 5.6 6.9  NEUTROABS -- --  HGB 11.9* 13.5  HCT 36.2 40.6  MCV 93.3 93.3  PLT 183 203   Cardiac Enzymes:  Basename 09/30/11 1817  CKTOTAL --  CKMB --  CKMBINDEX --  TROPONINI <0.30   CBG:  Basename 10/02/11 1123 10/02/11 0355 10/01/11 2024 10/01/11 1558 10/01/11 1201 10/01/11 0820  GLUCAP 92 95 95 103* 86 91   Misc. Labs:  No results found for this or any previous visit (from the past 240 hour(s)).  Studies/Results: Ct Abdomen Pelvis W Contrast  09/30/2011   *RADIOLOGY REPORT*  Clinical Data: Low abdominal pain with nausea and vomiting.  CT ABDOMEN AND PELVIS WITH CONTRAST  Technique:  Multidetector CT imaging of the abdomen and pelvis was performed following the standard protocol during bolus administration of intravenous contrast.  Contrast: OMNIPAQUE IOHEXOL 300 MG/ML IV SOLN  Comparison: Esophagram 09/23/2011.  Abdominal pelvic CT 06/25/2010.  Findings: There is mild atelectasis at the right lung base.  No pleural effusion is present.  The liver, spleen, gallbladder, pancreas, adrenal glands and kidneys demonstrate no significant findings.  There is retained barium within the colon from the esophagram of 1 week ago.  This creates moderate artifact within the pelvis.  There is mild proximal colonic distension.  The descending and sigmoid colon demonstrates moderate circumferential wall thickening extending from the iliac crest into the lower pelvis (images 56 - 73.    There is a prominent oval collection of barium on image 73 which may represent a diverticulum.  It is difficult to exclude a contained perforation in this area.  No unopacified extraluminal fluid collections are identified.  There is no free intraperitoneal air.  A small amount of free pelvic fluid is present.  The uterus is surgically absent.  There is no adnexal mass.  The urinary bladder appears normal.  Mild lumbar spondylosis and aortic atherosclerosis appear unchanged.  IMPRESSION:  1.  Abnormal retention of barium with mild distension of the colon proximal to circumferential thickening of the  walls of the distal descending and sigmoid colon.  Appearance is most consistent with diverticulitis or colitis.  Obstructing neoplasm is less likely but not completely excluded. 2.  A contained perforation containing barium adjacent to the sigmoid colon is difficult to exclude, although this may reflect barium within a diverticulum.  Because of streak artifact from the barium, this is difficult to  evaluate.  No drainable fluid collections are identified.  Imaging followup after therapy and evacuation of the barium is recommended.  Original Report Authenticated By: Gerrianne Scale, M.D.    Medications: Scheduled Meds:    . ciprofloxacin  400 mg Intravenous Q12H  . metronidazole  500 mg Intravenous Q8H   Continuous Infusions:    . 0.45 % NaCl with KCl 20 mEq / L    . sodium chloride    . DISCONTD: 0.45 % NaCl with KCl 20 mEq / L 100 mL/hr at 10/02/11 1417   PRN Meds:.acetaminophen, acetaminophen, morphine, ondansetron (ZOFRAN) IV, ondansetron  Assessment/Plan: 1-Diverticulitis With microperforations seen on CT of abdomen: pain significantly improved. Diet advance to clear. Continue IV antibiotics. Surgery on board; appreciate inputs.  2-HTN (hypertension): stable. Will use hydralazine as needed for now.  3-hypokalemia: Now resolved. Magnesium WNL  4-N/V: Currently stable continue use of antiemetics.significantly improved.  5-DVT: Will use SCDs.  6-Dispo: most likely home in 1-2 days.    LOS: 2 days   Evrett Hakim 10/02/2011, 4:12 PM

## 2011-10-03 LAB — BASIC METABOLIC PANEL
BUN: 7 mg/dL (ref 6–23)
CO2: 24 mEq/L (ref 19–32)
Calcium: 8.9 mg/dL (ref 8.4–10.5)
Chloride: 106 mEq/L (ref 96–112)
Creatinine, Ser: 0.8 mg/dL (ref 0.50–1.10)
GFR calc Af Amer: 88 mL/min — ABNORMAL LOW (ref >90)
GFR calc non Af Amer: 76 mL/min — ABNORMAL LOW (ref >90)
Glucose, Bld: 89 mg/dL (ref 70–99)
Potassium: 3.3 mEq/L — ABNORMAL LOW (ref 3.5–5.1)
Sodium: 138 mEq/L (ref 135–145)

## 2011-10-03 LAB — GLUCOSE, CAPILLARY
Glucose-Capillary: 99 mg/dL (ref 70–99)
Glucose-Capillary: 98 mg/dL (ref 70–99)
Glucose-Capillary: 109 mg/dL — ABNORMAL HIGH (ref 70–99)

## 2011-10-03 LAB — CBC
HCT: 33.6 % — ABNORMAL LOW (ref 36.0–46.0)
Hemoglobin: 11.6 g/dL — ABNORMAL LOW (ref 12.0–15.0)
MCH: 31.3 pg (ref 26.0–34.0)
MCHC: 34.5 g/dL (ref 30.0–36.0)
MCV: 90.6 fL (ref 78.0–100.0)
Platelets: 168 10*3/uL (ref 150–400)
RBC: 3.71 MIL/uL — ABNORMAL LOW (ref 3.87–5.11)
RDW: 13.6 % (ref 11.5–15.5)
WBC: 4.5 10*3/uL (ref 4.0–10.5)

## 2011-10-03 MED ORDER — AMLODIPINE BESYLATE 10 MG PO TABS
10.0000 mg | ORAL_TABLET | Freq: Every day | ORAL | Status: DC
  Administered 2011-10-03 – 2011-10-06 (×3): 10 mg via ORAL
  Filled 2011-10-03 (×4): qty 1

## 2011-10-03 MED ORDER — MORPHINE SULFATE 2 MG/ML IJ SOLN: 1.0000 mg | Freq: Four times a day (QID) | INTRAMUSCULAR | Status: DC | PRN

## 2011-10-03 MED ORDER — TRAMADOL-ACETAMINOPHEN 37.5-325 MG PO TABS
2.0000 | ORAL_TABLET | ORAL | Status: DC | PRN
  Administered 2011-10-03 – 2011-10-06 (×5): 2 via ORAL
  Filled 2011-10-03 (×6): qty 2

## 2011-10-03 MED ORDER — VENLAFAXINE HCL ER 150 MG PO CP24
150.0000 mg | ORAL_CAPSULE | Freq: Every day | ORAL | Status: DC
  Administered 2011-10-03 – 2011-10-05 (×3): 150 mg via ORAL
  Filled 2011-10-03 (×4): qty 1

## 2011-10-03 NOTE — Progress Notes (Signed)
Pt. Refusing SCD's at this time. Ambulatory in room. Harlow Asa

## 2011-10-03 NOTE — Progress Notes (Signed)
  Subjective: Passing flatus some pain with clears yesterday  Objective: Vital signs in last 24 hours: Temp:  [96.9 F (36.1 C)-98.9 F (37.2 C)] 96.9 F (36.1 C) (12/08 0437) Pulse Rate:  [76-78] 77  (12/08 0437) Resp:  [18-20] 20  (12/08 0437) BP: (134-146)/(77-84) 134/77 mmHg (12/08 0437) SpO2:  [97 %-100 %] 97 % (12/08 0437) Last BM Date: 09/28/11  Intake/Output from previous day: 12/07 0701 - 12/08 0700 In: 1820 [P.O.:1420; IV Piggyback:400] Out: 1800 [Urine:1800] Intake/Output this shift:    GI: mild llq tenderness to palpation, bs present  Lab Results:   Basename 10/03/11 0500 10/02/11 0603  WBC 4.5 5.6  HGB 11.6* 11.9*  HCT 33.6* 36.2  PLT 168 183   BMET  Basename 10/03/11 0500 10/01/11 0900  NA 138 139  K 3.3* 3.5  CL 106 103  CO2 24 29  GLUCOSE 89 91  BUN 7 9  CREATININE 0.80 0.86  CALCIUM 8.9 9.0   PT/INR No results found for this basename: LABPROT:2,INR:2 in the last 72 hours ABG No results found for this basename: PHART:2,PCO2:2,PO2:2,HCO3:2 in the last 72 hours  Studies/Results: No results found.  Anti-infectives: Anti-infectives     Start     Dose/Rate Route Frequency Ordered Stop   10/01/11 0315   ciprofloxacin (CIPRO) IVPB 400 mg        400 mg 200 mL/hr over 60 Minutes Intravenous Every 12 hours 10/01/11 0310     10/01/11 0315   metroNIDAZOLE (FLAGYL) IVPB 500 mg        500 mg 100 mL/hr over 60 Minutes Intravenous Every 8 hours 10/01/11 0310     10/01/11 0000   ciprofloxacin (CIPRO) IVPB 400 mg        400 mg 200 mL/hr over 60 Minutes Intravenous  Once 09/30/11 2354 10/01/11 0405   10/01/11 0000   metroNIDAZOLE (FLAGYL) IVPB 500 mg        500 mg 100 mL/hr over 60 Minutes Intravenous  Once 09/30/11 2354 10/01/11 0204          Assessment/Plan: Diverticulitis  She has some tenderness on exam and has some mild pain to exam today.  Would continue iv abx another 24 hours, recheck wbc, continue clears, if doing better can  advance tomorrow and possibly switch to po abx   LOS: 3 days    [facility] 10/03/2011

## 2011-10-03 NOTE — Progress Notes (Signed)
Subjective: Feeling better. No acute distress. Abdominal pain continue improving. Passing flatus and tolerating clear liquids diet.  Objective: Vital signs in last 24 hours: Temp:  [96.9 F (36.1 C)-98.2 F (36.8 C)] 98.1 F (36.7 C) (12/08 1412) Pulse Rate:  [74-77] 74  (12/08 1412) Resp:  [18-20] 18  (12/08 1412) BP: (134-146)/(67-78) 146/67 mmHg (12/08 1412) SpO2:  [97 %-100 %] 99 % (12/08 1412) Weight change:  Last BM Date: 09/28/11  12/07 0701 - 12/08 0700 In: 1820 [P.O.:1420; IV Piggyback:400] Out: 1800 [Urine:1800] Total I/O In: 240 [P.O.:240] Out: -   Physical Exam: General: Alert, awake, oriented x3, in no acute distress. HEENT: No bruits, no goiter. Heart: Regular rate and rhythm, without murmurs, rubs, gallops. Lungs: Clear to auscultation bilaterally. Abdomen: Soft, mild tenderness with deep palpation in the left lower quadrant area; no rebound, positive bowel sounds. No guarding. Extremities: No clubbing cyanosis or edema with positive pedal pulses. Neuro: Grossly intact, nonfocal.   Lab Results: Basic Metabolic Panel:  Basename 10/03/11 0500 10/01/11 0900 09/30/11 1732  NA 138 139 --  K 3.3* 3.5 --  CL 106 103 --  CO2 24 29 --  GLUCOSE 89 91 --  BUN 7 9 --  CREATININE 0.80 0.86 --  CALCIUM 8.9 9.0 --  MG -- 1.9 2.3  PHOS -- -- --   Liver Function Tests:  Basename 10/01/11 0900  AST 17  ALT 14  ALKPHOS 63  BILITOT 0.8  PROT 6.9  ALBUMIN 3.4*   CBC:  Basename 10/03/11 0500 10/02/11 0603  WBC 4.5 5.6  NEUTROABS -- --  HGB 11.6* 11.9*  HCT 33.6* 36.2  MCV 90.6 93.3  PLT 168 183   Cardiac Enzymes:  Basename 09/30/11 1817  CKTOTAL --  CKMB --  CKMBINDEX --  TROPONINI <0.30   CBG:  Basename 10/03/11 1058 10/03/11 0605 10/02/11 2121 10/02/11 1623 10/02/11 1123 10/02/11 0355  GLUCAP 98 99 77 126* 92 95    Studies/Results: No results found.  Medications: Scheduled Meds:    . amLODipine  10 mg Oral Daily  . ciprofloxacin   400 mg Intravenous Q12H  . metronidazole  500 mg Intravenous Q8H  . venlafaxine  150 mg Oral QHS   Continuous Infusions:    . sodium chloride 60 mL/hr at 10/03/11 0343  . DISCONTD: 0.45 % NaCl with KCl 20 mEq / L 100 mL/hr at 10/02/11 1417  . DISCONTD: 0.45 % NaCl with KCl 20 mEq / L     PRN Meds:.acetaminophen, morphine, ondansetron (ZOFRAN) IV, ondansetron, traMADol-acetaminophen, DISCONTD: acetaminophen, DISCONTD: morphine  Assessment/Plan: 1-Diverticulitis With microperforations seen on CT of abdomen: abdominal pain continue improving. Diet advance to full liquid today, if tolerates ok will transition ABX's to PO. Surgery on board; appreciate inputs.  2-HTN (hypertension): SBP in the 150's. Will restart amlodipine.  3-Depression:Restart effexor.  4-N/V: Currently stable continue use of antiemetics PRN. Patient denies any vomiting episodes.  5-DVT: continue SCDs.  6-Dispo: most likely home tomorrow or Monday. She will need to follow low residue diet; received prescription for bowel regimen and arrangements for colonoscopy as an outpatient.    LOS: 3 days   [name] 10/03/2011, 3:02 PM

## 2011-10-04 DIAGNOSIS — K219 Gastro-esophageal reflux disease without esophagitis: Secondary | ICD-10-CM | POA: Diagnosis present

## 2011-10-04 LAB — BASIC METABOLIC PANEL
BUN: 4 mg/dL — ABNORMAL LOW (ref 6–23)
CO2: 26 mEq/L (ref 19–32)
Calcium: 9.2 mg/dL (ref 8.4–10.5)
Creatinine, Ser: 0.79 mg/dL (ref 0.50–1.10)

## 2011-10-04 LAB — CBC
MCH: 30.9 pg (ref 26.0–34.0)
MCV: 90.9 fL (ref 78.0–100.0)
Platelets: 174 10*3/uL (ref 150–400)
RBC: 3.63 MIL/uL — ABNORMAL LOW (ref 3.87–5.11)
RDW: 13.8 % (ref 11.5–15.5)

## 2011-10-04 LAB — MAGNESIUM: Magnesium: 1.9 mg/dL (ref 1.5–2.5)

## 2011-10-04 LAB — GLUCOSE, CAPILLARY: Glucose-Capillary: 113 mg/dL — ABNORMAL HIGH (ref 70–99)

## 2011-10-04 MED ORDER — PANTOPRAZOLE SODIUM 40 MG PO TBEC
40.0000 mg | DELAYED_RELEASE_TABLET | Freq: Every day | ORAL | Status: DC
  Administered 2011-10-04 – 2011-10-06 (×3): 40 mg via ORAL
  Filled 2011-10-04 (×3): qty 1

## 2011-10-04 MED ORDER — SIMVASTATIN 20 MG PO TABS
20.0000 mg | ORAL_TABLET | Freq: Every day | ORAL | Status: DC
  Administered 2011-10-04 – 2011-10-05 (×2): 20 mg via ORAL
  Filled 2011-10-04 (×3): qty 1

## 2011-10-04 MED ORDER — OXYBUTYNIN CHLORIDE ER 5 MG PO TB24
5.0000 mg | ORAL_TABLET | Freq: Every day | ORAL | Status: DC
  Administered 2011-10-04 – 2011-10-06 (×3): 5 mg via ORAL
  Filled 2011-10-04 (×3): qty 1

## 2011-10-04 MED ORDER — ALUM & MAG HYDROXIDE-SIMETH 200-200-20 MG/5ML PO SUSP: 30.0000 mL | Freq: Four times a day (QID) | ORAL | Status: DC | PRN

## 2011-10-04 MED ORDER — CIPROFLOXACIN HCL 500 MG PO TABS
500.0000 mg | ORAL_TABLET | Freq: Two times a day (BID) | ORAL | Status: DC
  Administered 2011-10-04 – 2011-10-06 (×5): 500 mg via ORAL
  Filled 2011-10-04 (×7): qty 1

## 2011-10-04 MED ORDER — METRONIDAZOLE 500 MG PO TABS
500.0000 mg | ORAL_TABLET | Freq: Three times a day (TID) | ORAL | Status: DC
  Administered 2011-10-04 – 2011-10-06 (×7): 500 mg via ORAL
  Filled 2011-10-04 (×9): qty 1

## 2011-10-04 MED ORDER — ROSUVASTATIN CALCIUM 20 MG PO TABS: 20.0000 mg | ORAL_TABLET | Freq: Every day | ORAL | Status: DC

## 2011-10-04 MED ORDER — EZETIMIBE 10 MG PO TABS
10.0000 mg | ORAL_TABLET | Freq: Every day | ORAL | Status: DC
  Administered 2011-10-04 – 2011-10-06 (×3): 10 mg via ORAL
  Filled 2011-10-04 (×3): qty 1

## 2011-10-04 MED ORDER — POTASSIUM CHLORIDE 20 MEQ/15ML (10%) PO LIQD
40.0000 meq | Freq: Once | ORAL | Status: AC
  Administered 2011-10-04: 40 meq via ORAL
  Filled 2011-10-04: qty 30

## 2011-10-04 MED ORDER — LACTULOSE 10 GM/15ML PO SOLN
20.0000 g | Freq: Two times a day (BID) | ORAL | Status: DC | PRN
  Administered 2011-10-06: 20 g via ORAL
  Filled 2011-10-04 (×2): qty 30

## 2011-10-04 MED ORDER — POTASSIUM CHLORIDE CRYS ER 10 MEQ PO TBCR
10.0000 meq | EXTENDED_RELEASE_TABLET | Freq: Every day | ORAL | Status: DC
  Administered 2011-10-04 – 2011-10-06 (×3): 10 meq via ORAL
  Filled 2011-10-04 (×3): qty 1

## 2011-10-04 MED ORDER — EZETIMIBE 10 MG PO TABS: 10.0000 mg | ORAL_TABLET | Freq: Every day | ORAL | Status: DC

## 2011-10-04 NOTE — Progress Notes (Signed)
  Subjective: Feels better  Objective: Vital signs in last 24 hours: Temp:  [98 F (36.7 C)-98.2 F (36.8 C)] 98 F (36.7 C) (12/09 0456) Pulse Rate:  [74-82] 74  (12/09 0456) Resp:  [18-20] 20  (12/09 0456) BP: (115-146)/(64-79) 115/64 mmHg (12/09 0456) SpO2:  [95 %-99 %] 95 % (12/09 0456) Last BM Date: 09/28/11  Intake/Output from previous day: 12/08 0701 - 12/09 0700 In: 480 [P.O.:480] Out: 500 [Urine:500] Intake/Output this shift:    GI: soft, non-tender; bowel sounds normal; no masses,  no organomegaly  Lab Results:   Basename 10/03/11 0500 10/02/11 0603  WBC 4.5 5.6  HGB 11.6* 11.9*  HCT 33.6* 36.2  PLT 168 183   BMET  Basename 10/03/11 0500 10/01/11 0900  NA 138 139  K 3.3* 3.5  CL 106 103  CO2 24 29  GLUCOSE 89 91  BUN 7 9  CREATININE 0.80 0.86  CALCIUM 8.9 9.0   PT/INR No results found for this basename: LABPROT:2,INR:2 in the last 72 hours ABG No results found for this basename: PHART:2,PCO2:2,PO2:2,HCO3:2 in the last 72 hours  Studies/Results: No results found.  Anti-infectives: Anti-infectives     Start     Dose/Rate Route Frequency Ordered Stop   10/01/11 0315   ciprofloxacin (CIPRO) IVPB 400 mg        400 mg 200 mL/hr over 60 Minutes Intravenous Every 12 hours 10/01/11 0310     10/01/11 0315   metroNIDAZOLE (FLAGYL) IVPB 500 mg        500 mg 100 mL/hr over 60 Minutes Intravenous Every 8 hours 10/01/11 0310     10/01/11 0000   ciprofloxacin (CIPRO) IVPB 400 mg        400 mg 200 mL/hr over 60 Minutes Intravenous  Once 09/30/11 2354 10/01/11 0405   10/01/11 0000   metroNIDAZOLE (FLAGYL) IVPB 500 mg        500 mg 100 mL/hr over 60 Minutes Intravenous  Once 09/30/11 2354 10/01/11 0204          Assessment/Plan: Diverticulitis Adv diet PO abx Plan for discharge tomorrow  LOS: 4 days    [name] A. 10/04/2011

## 2011-10-04 NOTE — Progress Notes (Addendum)
[name] is a 65 y.o. female patient admitted with abdominal pains, found to have diverticulitis with microperforation. Her course seems one of improvement. Appreciate surgical input..  SUBJECTIVE Feels better, has "gas" at times. No pain on eating.   1. Diverticulitis     Past Medical History  Diagnosis Date  . Reflux   . Hypercholesteremia   . Hypertension   . Coronary artery disease   . Diabetes mellitus   . GERD (gastroesophageal reflux disease)    Current Facility-Administered Medications  Medication Dose Route Frequency Provider Last Rate Last Dose  . 0.9 %  sodium chloride infusion   Intravenous Continuous Vassie Loll, MD 20 mL/hr at 10/03/11 1621 20 mL/hr at 10/03/11 1621  . acetaminophen (TYLENOL) tablet 650 mg  650 mg Oral Q6H PRN Eduard Clos   650 mg at 10/03/11 0437  . alum & mag hydroxide-simeth (MAALOX/MYLANTA) 200-200-20 MG/5ML suspension 30 mL  30 mL Oral Q6H PRN Benedetto Ryder      . amLODipine (NORVASC) tablet 10 mg  10 mg Oral Daily Vassie Loll, MD   10 mg at 10/03/11 1614  . ciprofloxacin (CIPRO) tablet 500 mg  500 mg Oral BID Thomas A. Cornett, MD      . ezetimibe (ZETIA) tablet 10 mg  10 mg Oral Daily Thomas A. Cornett, MD      . ezetimibe (ZETIA) tablet 10 mg  10 mg Oral Daily Victor Granados      . lactulose (CHRONULAC) 10 GM/15ML solution 20 g  20 g Oral BID PRN Nashon Erbes      . metroNIDAZOLE (FLAGYL) tablet 500 mg  500 mg Oral Q8H Thomas A. Cornett, MD      . morphine 2 MG/ML injection 1 mg  1 mg Intravenous Q6H PRN Vassie Loll, MD      . ondansetron [facility]) tablet 4 mg  4 mg Oral Q6H PRN Eduard Clos   4 mg at 10/02/11 2222   Or  . ondansetron (ZOFRAN) injection 4 mg  4 mg Intravenous Q6H PRN Eduard Clos      . oxybutynin (DITROPAN-XL) 24 hr tablet 5 mg  5 mg Oral Daily Agustina Witzke      . pantoprazole (PROTONIX) EC tablet 40 mg  40 mg Oral Daily Kamdon Reisig      . potassium chloride (K-DUR,KLOR-CON) CR tablet 10  mEq  10 mEq Oral Daily Adalae Baysinger      . potassium chloride 20 MEQ/15ML (10%) liquid 40 mEq  40 mEq Oral Once Emmet Messer      . rosuvastatin (CRESTOR) tablet 20 mg  20 mg Oral Daily Norlene Lanes      . simvastatin (ZOCOR) tablet 20 mg  20 mg Oral QPC supper Thomas A. Cornett, MD      . traMADol-acetaminophen (ULTRACET) 37.5-325 MG per tablet 2 tablet  2 tablet Oral Q4H PRN Vassie Loll, MD   2 tablet at 10/03/11 2137  . venlafaxine (EFFEXOR-XR) 24 hr capsule 150 mg  150 mg Oral QHS Vassie Loll, MD   150 mg at 10/03/11 2126  . DISCONTD: acetaminophen (TYLENOL) suppository 650 mg  650 mg Rectal Q6H PRN Eduard Clos      . DISCONTD: ciprofloxacin (CIPRO) IVPB 400 mg  400 mg Intravenous Q12H Arshad N. Kakrakandy   400 mg at 10/04/11 0340  . DISCONTD: metroNIDAZOLE (FLAGYL) IVPB 500 mg  500 mg Intravenous Q8H Arshad N. Kakrakandy   500 mg at 10/04/11 0237  . DISCONTD: morphine 2  MG/ML injection 1 mg  1 mg Intravenous Q4H PRN Eduard Clos   1 mg at 10/02/11 0846   Allergies  Allergen Reactions  . Peanut-Containing Drug Products     swelling  . Sulfa Drugs Cross Reactors     Chest pain   Principal Problem:  *Diverticulitis Active Problems:  HTN (hypertension)  GERD (gastroesophageal reflux disease)   Vital signs in last 24 hours: Temp:  [98 F (36.7 C)-98.2 F (36.8 C)] 98 F (36.7 C) (12/09 0456) Pulse Rate:  [74-82] 74  (12/09 0456) Resp:  [18-20] 20  (12/09 0456) BP: (115-146)/(64-79) 115/64 mmHg (12/09 0456) SpO2:  [95 %-99 %] 95 % (12/09 0456) Weight change:  Last BM Date: 09/28/11  Intake/Output from previous day: 12/08 0701 - 12/09 0700 In: 480 [P.O.:480] Out: 500 [Urine:500] Intake/Output this shift:    Lab Results:  Basename 10/04/11 0652 10/03/11 0500  WBC 3.8* 4.5  HGB 11.2* 11.6*  HCT 33.0* 33.6*  PLT 174 168   BMET  Basename 10/04/11 0652 10/03/11 0500  NA 140 138  K 3.2* 3.3*  CL 107 106  CO2 26 24  GLUCOSE 84 89  BUN 4* 7    CREATININE 0.79 0.80  CALCIUM 9.2 8.9    Studies/Results: No results found.  Medications: I have reviewed the patient's current medications.   Physical exam GENERAL- alert and well HEAD- normal atraumatic, no neck masses, normal thyroid, no jvd RESPIRATORY- appears well, vitals normal, no respiratory distress, acyanotic, normal RR, ear and throat exam is normal, neck free of mass or lymphadenopathy, chest clear, no wheezing, crepitations, rhonchi, normal symmetric air entry CVS- regular rate and rhythm, S1, S2 normal, no murmur, click, rub or gallop ABDOMEN- abdomen is soft without significant tenderness, masses, organomegaly or guarding NEURO- Grossly normal EXTREMITIES- extremities normal, atraumatic, no cyanosis or edema  Plan 1. Diverticulitis with microperforation- being managed conservatively, tolerating abx/feeding. Now on oral abx.  2.  HTN (hypertension)- controlled.  3. GERD (gastroesophageal reflux disease)-resume ppi. Ambulate. Likely d/c in am.    Floyed Masoud 10/04/2011 12:25 PM Pager: [phone].   hypokalemia- seems chronic ? Cause. Resume home dose of kcl. Check magnesium level.

## 2011-10-05 NOTE — Progress Notes (Addendum)
Clinically improved. No need for repeat CT scan at this time.  The initial CT scan was limited due to retained contrast in colon from upper GI.  The "contained perforation" might have been artifact from the contrast. Advance diet - PO antibiotics Home soon.  Wilmon Arms. Corliss Skains, MD, [facility]  10/05/2011 10:42 AM

## 2011-10-05 NOTE — Progress Notes (Signed)
Surgery appreciated.  SUBJECTIVE Ok but has not had bowel movement.   1. Diverticulitis     Past Medical History  Diagnosis Date  . Reflux   . Hypercholesteremia   . Hypertension   . Coronary artery disease   . Diabetes mellitus   . GERD (gastroesophageal reflux disease)    Current Facility-Administered Medications  Medication Dose Route Frequency Provider Last Rate Last Dose  . 0.9 %  sodium chloride infusion   Intravenous Continuous Vassie Loll, MD 20 mL/hr at 10/05/11 1438    . acetaminophen (TYLENOL) tablet 650 mg  650 mg Oral Q6H PRN Eduard Clos   650 mg at 10/03/11 0437  . alum & mag hydroxide-simeth (MAALOX/MYLANTA) 200-200-20 MG/5ML suspension 30 mL  30 mL Oral Q6H PRN Efrem Pitstick      . amLODipine (NORVASC) tablet 10 mg  10 mg Oral Daily Vassie Loll, MD   10 mg at 10/05/11 1015  . ciprofloxacin (CIPRO) tablet 500 mg  500 mg Oral BID Thomas A. Cornett, MD   500 mg at 10/05/11 1016  . ezetimibe (ZETIA) tablet 10 mg  10 mg Oral Daily Thomas A. Cornett, MD   10 mg at 10/05/11 1015  . lactulose (CHRONULAC) 10 GM/15ML solution 20 g  20 g Oral BID PRN Honor Fairbank      . metroNIDAZOLE (FLAGYL) tablet 500 mg  500 mg Oral Q8H Thomas A. Cornett, MD   500 mg at 10/05/11 1719  . morphine 2 MG/ML injection 1 mg  1 mg Intravenous Q6H PRN Vassie Loll, MD      . ondansetron [facility]) tablet 4 mg  4 mg Oral Q6H PRN Eduard Clos   4 mg at 10/02/11 2222   Or  . ondansetron (ZOFRAN) injection 4 mg  4 mg Intravenous Q6H PRN Eduard Clos   4 mg at 10/05/11 1610  . oxybutynin (DITROPAN-XL) 24 hr tablet 5 mg  5 mg Oral Daily Serafina Topham   5 mg at 10/05/11 1015  . pantoprazole (PROTONIX) EC tablet 40 mg  40 mg Oral Daily Keyonna Comunale   40 mg at 10/05/11 1019  . potassium chloride (K-DUR,KLOR-CON) CR tablet 10 mEq  10 mEq Oral Daily Jewelianna Pancoast   10 mEq at 10/05/11 1015  . simvastatin (ZOCOR) tablet 20 mg  20 mg Oral QPC supper Thomas A. Cornett, MD   20 mg at  10/05/11 1719  . traMADol-acetaminophen (ULTRACET) 37.5-325 MG per tablet 2 tablet  2 tablet Oral Q4H PRN Vassie Loll, MD   2 tablet at 10/05/11 1148  . venlafaxine (EFFEXOR-XR) 24 hr capsule 150 mg  150 mg Oral QHS Vassie Loll, MD   150 mg at 10/04/11 2247   Allergies  Allergen Reactions  . Peanut-Containing Drug Products     swelling  . Sulfa Drugs Cross Reactors     Chest pain   Principal Problem:  *Diverticulitis Active Problems:  HTN (hypertension)  GERD (gastroesophageal reflux disease)   Vital signs in last 24 hours: Temp:  [98.2 F (36.8 C)-98.3 F (36.8 C)] 98.2 F (36.8 C) (12/10 1542) Pulse Rate:  [62-75] 68  (12/10 1542) Resp:  [18-19] 18  (12/10 1542) BP: (108-128)/(58-71) 108/58 mmHg (12/10 1542) SpO2:  [96 %-99 %] 97 % (12/10 1542) Weight change:  Last BM Date: 09/28/11  Intake/Output from previous day: 12/09 0701 - 12/10 0700 In: 720 [P.O.:720] Out: 850 [Urine:850] Intake/Output this shift: Total I/O In: 1527.3 [P.O.:1080; I.V.:447.3] Out: -   Lab Results:  Basename 10/04/11 0652 10/03/11 0500  WBC 3.8* 4.5  HGB 11.2* 11.6*  HCT 33.0* 33.6*  PLT 174 168   BMET  Basename 10/04/11 0652 10/03/11 0500  NA 140 138  K 3.2* 3.3*  CL 107 106  CO2 26 24  GLUCOSE 84 89  BUN 4* 7  CREATININE 0.79 0.80  CALCIUM 9.2 8.9    Studies/Results: No results found.  Medications: I have reviewed the patient's current medications.   Physical exam GENERAL- alert and well HEAD- normal atraumatic, no neck masses, normal thyroid, no jvd RESPIRATORY- appears well, vitals normal, no respiratory distress, acyanotic, normal RR, ear and throat exam is normal, neck free of mass or lymphadenopathy, chest clear, no wheezing, crepitations, rhonchi, normal symmetric air entry CVS- regular rate and rhythm, S1, S2 normal, no murmur, click, rub or gallop ABDOMEN- some tenderness to deep palpation LLQ. NEURO- Grossly normal EXTREMITIES- extremities normal,  atraumatic, no cyanosis or edema  Plan  1. Diverticulitis with microperforation- being managed conservatively, tolerating abx/feeding. Now on oral abx. Agree with ambulation.  2. HTN (hypertension)- controlled.  3. GERD (gastroesophageal reflux disease)-resume ppi.  Ambulate. Likely d/c in am.      [name] 10/05/2011 6:59 PM Pager: [phone].

## 2011-10-05 NOTE — Progress Notes (Signed)
Subjective: Feels fine no BM, so far, hasn't been out of room.    Objective: Vital signs in last 24 hours: Temp:  [98.2 F (36.8 C)-98.9 F (37.2 C)] 98.3 F (36.8 C) (12/10 0424) Pulse Rate:  [62-80] 62  (12/10 0424) Resp:  [18-19] 18  (12/10 0424) BP: (97-135)/(38-89) 123/70 mmHg (12/10 0424) SpO2:  [96 %-99 %] 96 % (12/10 0424) Last BM Date: 09/28/11  Intake/Output from previous day: 12/09 0701 - 12/10 0700 In: 720 [P.O.:720] Out: 850 [Urine:850] Intake/Output this shift: Total I/O In: 240 [I.V.:240] Out: -   General appearance: alert, cooperative and no distress GI: soft, non-tender; bowel sounds normal; no masses,  no organomegaly  Lab Results:   Basename 10/04/11 0652 10/03/11 0500  WBC 3.8* 4.5  HGB 11.2* 11.6*  HCT 33.0* 33.6*  PLT 174 168    BMET  Basename 10/04/11 0652 10/03/11 0500  NA 140 138  K 3.2* 3.3*  CL 107 106  CO2 26 24  GLUCOSE 84 89  BUN 4* 7  CREATININE 0.79 0.80  CALCIUM 9.2 8.9   PT/INR No results found for this basename: LABPROT:2,INR:2 in the last 72 hours   Studies/Results: No results found.  Anti-infectives: Anti-infectives     Start     Dose/Rate Route Frequency Ordered Stop   10/04/11 0815   metroNIDAZOLE (FLAGYL) tablet 500 mg        500 mg Oral 3 times per day 10/04/11 0800     10/04/11 0800   ciprofloxacin (CIPRO) tablet 500 mg        500 mg Oral 2 times daily 10/04/11 0800     10/01/11 0315   ciprofloxacin (CIPRO) IVPB 400 mg  Status:  Discontinued        400 mg 200 mL/hr over 60 Minutes Intravenous Every 12 hours 10/01/11 0310 10/04/11 0800   10/01/11 0315   metroNIDAZOLE (FLAGYL) IVPB 500 mg  Status:  Discontinued        500 mg 100 mL/hr over 60 Minutes Intravenous Every 8 hours 10/01/11 0310 10/04/11 0800   10/01/11 0000   ciprofloxacin (CIPRO) IVPB 400 mg        400 mg 200 mL/hr over 60 Minutes Intravenous  Once 09/30/11 2354 10/01/11 0405   10/01/11 0000   metroNIDAZOLE (FLAGYL) IVPB 500 mg          500 mg 100 mL/hr over 60 Minutes Intravenous  Once 09/30/11 2354 10/01/11 0204         Current Facility-Administered Medications  Medication Dose Route Frequency Provider Last Rate Last Dose  . 0.9 %  sodium chloride infusion   Intravenous Continuous Vassie Loll, MD 20 mL/hr at 10/05/11 [phone]    . acetaminophen (TYLENOL) tablet 650 mg  650 mg Oral Q6H PRN Eduard Clos   650 mg at 10/03/11 0437  . alum & mag hydroxide-simeth (MAALOX/MYLANTA) 200-200-20 MG/5ML suspension 30 mL  30 mL Oral Q6H PRN Simbiso Ranga      . amLODipine (NORVASC) tablet 10 mg  10 mg Oral Daily Vassie Loll, MD   10 mg at 10/03/11 1614  . ciprofloxacin (CIPRO) tablet 500 mg  500 mg Oral BID Thomas A. Cornett, MD   500 mg at 10/04/11 2050  . ezetimibe (ZETIA) tablet 10 mg  10 mg Oral Daily Thomas A. Cornett, MD   10 mg at 10/04/11 1234  . lactulose (CHRONULAC) 10 GM/15ML solution 20 g  20 g Oral BID PRN Simbiso Ranga      .  metroNIDAZOLE (FLAGYL) tablet 500 mg  500 mg Oral Q8H Thomas A. Cornett, MD   500 mg at 10/05/11 0214  . morphine 2 MG/ML injection 1 mg  1 mg Intravenous Q6H PRN Vassie Loll, MD      . ondansetron [facility]) tablet 4 mg  4 mg Oral Q6H PRN Eduard Clos   4 mg at 10/02/11 2222   Or  . ondansetron (ZOFRAN) injection 4 mg  4 mg Intravenous Q6H PRN Eduard Clos   4 mg at 10/05/11 4098  . oxybutynin (DITROPAN-XL) 24 hr tablet 5 mg  5 mg Oral Daily Simbiso Ranga   5 mg at 10/04/11 1659  . pantoprazole (PROTONIX) EC tablet 40 mg  40 mg Oral Daily Simbiso Ranga   40 mg at 10/04/11 1248  . potassium chloride (K-DUR,KLOR-CON) CR tablet 10 mEq  10 mEq Oral Daily Simbiso Ranga   10 mEq at 10/04/11 1659  . potassium chloride 20 MEQ/15ML (10%) liquid 40 mEq  40 mEq Oral Once Simbiso Ranga   40 mEq at 10/04/11 1234  . simvastatin (ZOCOR) tablet 20 mg  20 mg Oral QPC supper Thomas A. Cornett, MD   20 mg at 10/04/11 1705  . traMADol-acetaminophen (ULTRACET) 37.5-325 MG per tablet 2 tablet   2 tablet Oral Q4H PRN Vassie Loll, MD   2 tablet at 10/04/11 1644  . venlafaxine (EFFEXOR-XR) 24 hr capsule 150 mg  150 mg Oral QHS Vassie Loll, MD   150 mg at 10/04/11 2247  . DISCONTD: ezetimibe (ZETIA) tablet 10 mg  10 mg Oral Daily Simbiso Ranga      . DISCONTD: rosuvastatin (CRESTOR) tablet 20 mg  20 mg Oral Daily Simbiso Ranga        Assessment/Plan Sigmoid Diverticulitis with possible contained perforation  Patient Active Problem List  Diagnoses  . Diverticulitis  . HTN (hypertension)  . GERD (gastroesophageal reflux disease)  Plan: Continue antibiotics, low residual diet. Follow up with GI.  She knows Dr. Juanda Chance GI. Ambulate in halls.   LOS: 5 days    Cay Kath 10/05/2011

## 2011-10-06 LAB — BASIC METABOLIC PANEL
CO2: 28 mEq/L (ref 19–32)
Calcium: 8.8 mg/dL (ref 8.4–10.5)
GFR calc Af Amer: 77 mL/min — ABNORMAL LOW (ref >90)
GFR calc non Af Amer: 66 mL/min — ABNORMAL LOW (ref >90)
Sodium: 140 mEq/L (ref 135–145)

## 2011-10-06 LAB — MAGNESIUM: Magnesium: 2 mg/dL (ref 1.5–2.5)

## 2011-10-06 MED ORDER — ONDANSETRON HCL 4 MG PO TABS: 4.0000 mg | ORAL_TABLET | Freq: Four times a day (QID) | ORAL | Status: AC | PRN

## 2011-10-06 MED ORDER — METRONIDAZOLE 500 MG PO TABS: 500.0000 mg | ORAL_TABLET | Freq: Three times a day (TID) | ORAL | Status: AC

## 2011-10-06 MED ORDER — CIPROFLOXACIN HCL 500 MG PO TABS: 500.0000 mg | ORAL_TABLET | Freq: Two times a day (BID) | ORAL | Status: AC

## 2011-10-06 MED ORDER — HYDROCODONE-ACETAMINOPHEN 7.5-750 MG PO TABS: 1.0000 | ORAL_TABLET | Freq: Four times a day (QID) | ORAL | Status: DC | PRN

## 2011-10-06 NOTE — Progress Notes (Signed)
Pt. Discharged to home. Tele d/c and IV d/c and intact. Discharge instructions complete and patient education complete. Pt had no further questions.   Dion Saucier

## 2011-10-06 NOTE — Progress Notes (Signed)
Will sign off for now.  Call us as needed.  Wilmon Arms. Corliss Skains, MD, [facility]  10/06/2011 8:46 AM

## 2011-10-06 NOTE — Progress Notes (Signed)
Patient ID: [name], female   DOB: 04-16-1946, 65 y.o.   MRN: [id number]    Subjective: Feels fine, still c/o no BM so far.  No abdominal pain. Objective: Vital signs in last 24 hours: Temp:  [97.6 F (36.4 C)-98.4 F (36.9 C)] 97.6 F (36.4 C) (12/11 0424) Pulse Rate:  [66-72] 66  (12/11 0424) Resp:  [18-20] 18  (12/11 0424) BP: (108-123)/(58-71) 109/70 mmHg (12/11 0424) SpO2:  [95 %-97 %] 95 % (12/11 0424) Last BM Date: 09/28/11  Intake/Output from previous day: 12/10 0701 - 12/11 0700 In: 2007.3 [P.O.:1560; I.V.:447.3] Out: -  Intake/Output this shift:    General appearance: alert, cooperative and no distress GI: soft, non-tender; bowel sounds normal; no masses,  no organomegaly  Lab Results:   [facility] 10/04/11 0652  WBC 3.8*  HGB 11.2*  HCT 33.0*  PLT 174    BMET  Basename 10/06/11 0530 10/04/11 0652  NA 140 140  K 4.7 3.2*  CL 106 107  CO2 28 26  GLUCOSE 84 84  BUN 7 4*  CREATININE 0.90 0.79  CALCIUM 8.8 9.2   PT/INR No results found for this basename: LABPROT:2,INR:2 in the last 72 hours   Studies/Results: No results found.  Anti-infectives: Anti-infectives     Start     Dose/Rate Route Frequency Ordered Stop   10/04/11 0815   metroNIDAZOLE (FLAGYL) tablet 500 mg        500 mg Oral 3 times per day 10/04/11 0800     10/04/11 0800   ciprofloxacin (CIPRO) tablet 500 mg        500 mg Oral 2 times daily 10/04/11 0800     10/01/11 0315   ciprofloxacin (CIPRO) IVPB 400 mg  Status:  Discontinued        400 mg 200 mL/hr over 60 Minutes Intravenous Every 12 hours 10/01/11 0310 10/04/11 0800   10/01/11 0315   metroNIDAZOLE (FLAGYL) IVPB 500 mg  Status:  Discontinued        500 mg 100 mL/hr over 60 Minutes Intravenous Every 8 hours 10/01/11 0310 10/04/11 0800   10/01/11 0000   ciprofloxacin (CIPRO) IVPB 400 mg        400 mg 200 mL/hr over 60 Minutes Intravenous  Once 09/30/11 2354 10/01/11 0405   10/01/11 0000   metroNIDAZOLE (FLAGYL) IVPB 500  mg        500 mg 100 mL/hr over 60 Minutes Intravenous  Once 09/30/11 2354 10/01/11 0204         Current Facility-Administered Medications  Medication Dose Route Frequency Provider Last Rate Last Dose  . 0.9 %  sodium chloride infusion   Intravenous Continuous Vassie Loll, MD 20 mL/hr at 10/05/11 1438    . acetaminophen (TYLENOL) tablet 650 mg  650 mg Oral Q6H PRN Eduard Clos   650 mg at 10/03/11 0437  . alum & mag hydroxide-simeth (MAALOX/MYLANTA) 200-200-20 MG/5ML suspension 30 mL  30 mL Oral Q6H PRN Simbiso Ranga      . amLODipine (NORVASC) tablet 10 mg  10 mg Oral Daily Vassie Loll, MD   10 mg at 10/05/11 1015  . ciprofloxacin (CIPRO) tablet 500 mg  500 mg Oral BID Thomas A. Cornett, MD   500 mg at 10/05/11 2155  . ezetimibe (ZETIA) tablet 10 mg  10 mg Oral Daily Thomas A. Cornett, MD   10 mg at 10/05/11 1015  . lactulose (CHRONULAC) 10 GM/15ML solution 20 g  20 g Oral BID PRN Simbiso Ranga      .  metroNIDAZOLE (FLAGYL) tablet 500 mg  500 mg Oral Q8H Thomas A. Cornett, MD   500 mg at 10/06/11 0113  . morphine 2 MG/ML injection 1 mg  1 mg Intravenous Q6H PRN Vassie Loll, MD      . ondansetron [facility]) tablet 4 mg  4 mg Oral Q6H PRN Eduard Clos   4 mg at 10/02/11 2222   Or  . ondansetron (ZOFRAN) injection 4 mg  4 mg Intravenous Q6H PRN Eduard Clos   4 mg at 10/05/11 1610  . oxybutynin (DITROPAN-XL) 24 hr tablet 5 mg  5 mg Oral Daily Simbiso Ranga   5 mg at 10/05/11 1015  . pantoprazole (PROTONIX) EC tablet 40 mg  40 mg Oral Daily Simbiso Ranga   40 mg at 10/05/11 1019  . potassium chloride (K-DUR,KLOR-CON) CR tablet 10 mEq  10 mEq Oral Daily Simbiso Ranga   10 mEq at 10/05/11 1015  . simvastatin (ZOCOR) tablet 20 mg  20 mg Oral QPC supper Thomas A. Cornett, MD   20 mg at 10/05/11 1719  . traMADol-acetaminophen (ULTRACET) 37.5-325 MG per tablet 2 tablet  2 tablet Oral Q4H PRN Vassie Loll, MD   2 tablet at 10/06/11 0110  . venlafaxine (EFFEXOR-XR) 24 hr  capsule 150 mg  150 mg Oral QHS Vassie Loll, MD   150 mg at 10/05/11 2205    Assessment/Plan Sigmoid Diverticulitis with possible contained perforation  Patient Active Problem List  Diagnoses  . Diverticulitis  . HTN (hypertension)  . GERD (gastroesophageal reflux disease)  Plan: Continue antibiotics, low residual diet. Follow up with GI.  She knows Dr. Juanda Chance GI. Ambulate in halls. We will see again as needed. Please feel free to call anytime.   LOS: 6 days    [name] 10/06/2011

## 2011-10-07 NOTE — Discharge Summary (Signed)
DISCHARGE SUMMARY  [name]  MR#: [id number]  DOB:02/17/1946  Date of Admission: 09/30/2011 Date of Discharge: 10/07/2011  Attending Physician:Masen Salvas  Patient's PCP:No primary provider on file.  Consults:  Dr Corliss Skains  Discharge Diagnoses: Present on Admission:  .Diverticulitis with microperforation. .Diabetes mellitus .HTN (hypertension) .GERD (gastroesophageal reflux disease)    Discharge Medication List as of 10/06/2011  2:45 PM    START taking these medications   Details  ciprofloxacin (CIPRO) 500 MG tablet Take 1 tablet (500 mg total) by mouth 2 (two) times daily., Starting 10/06/2011, Until Fri 10/16/11, Normal    metroNIDAZOLE (FLAGYL) 500 MG tablet Take 1 tablet (500 mg total) by mouth every 8 (eight) hours., Starting 10/06/2011, Until Fri 10/16/11, Normal    ondansetron (ZOFRAN) 4 MG tablet Take 1 tablet (4 mg total) by mouth every 6 (six) hours as needed for nausea., Starting 10/06/2011, Until Tue 10/13/11, Normal      CONTINUE these medications which have CHANGED   Details  HYDROcodone-acetaminophen (VICODIN ES) 7.5-750 MG per tablet Take 1 tablet by mouth every 6 (six) hours as needed., Starting 10/06/2011, Until Discontinued, Print      CONTINUE these medications which have NOT CHANGED   Details  amLODipine (NORVASC) 10 MG tablet Take 10 mg by mouth daily. , Until Discontinued, Historical Med    atorvastatin (LIPITOR) 20 MG tablet Take 20 mg by mouth daily.  , Until Discontinued, Historical Med    esomeprazole (NEXIUM) 40 MG capsule Take 40 mg by mouth 2 (two) times daily.  , Until Discontinued, Historical Med    ezetimibe (ZETIA) 10 MG tablet Take 10 mg by mouth daily.  , Until Discontinued, Historical Med    lactulose (CHRONULAC) 10 GM/15ML solution Take 20 g by mouth 2 (two) times daily as needed. For constipation , Until Discontinued, Historical Med    oxybutynin (DITROPAN-XL) 5 MG 24 hr tablet Take 5 mg by mouth daily.  , Until Discontinued,  Historical Med    potassium chloride (MICRO-K) 10 MEQ CR capsule Take 10 mEq by mouth daily.  , Until Discontinued, Historical Med    traMADol (ULTRAM) 50 MG tablet Take 50 mg by mouth 3 (three) times daily as needed. Maximum dose= 8 tablets per day for pain, Until Discontinued, Historical Med    venlafaxine (EFFEXOR-XR) 150 MG 24 hr capsule Take 150 mg by mouth at bedtime.  , Until Discontinued, Historical Med          Hospital Course: [name] was admitted on 10/01/11 with abdominal pain. She found to have sigmoid diverticulitis with some microperforation. Surgery was consulted and managed her conservatively. She is being discharegd on cipro/flagyl, to follow with Dr Juanda Chance for ?colonoscopy. Otherwise her hospital saty was uneventful.   Day of Discharge BP 103/66  Pulse 71  Temp(Src) 98.5 F (36.9 C) (Oral)  Resp 20  Ht 5\' 6"  (1.676 m)  Wt 64.5 kg (142 lb 3.2 oz)  BMI 22.95 kg/m2  SpO2 99%  Physical Exam: Some tenderness to deep palpation LLQ. No rebound or guarding.  Results for orders placed during the hospital encounter of 09/30/11 (from the past 24 hour(s))  BASIC METABOLIC PANEL     Status: Abnormal   Collection Time   10/06/11  5:30 AM      Component Value Range   Sodium 140  135 - 145 (mEq/L)   Potassium 4.7  3.5 - 5.1 (mEq/L)   Chloride 106  96 - 112 (mEq/L)   CO2 28  19 - 32 (  mEq/L)   Glucose, Bld 84  70 - 99 (mg/dL)   BUN 7  6 - 23 (mg/dL)   Creatinine, Ser 9.60  0.50 - 1.10 (mg/dL)   Calcium 8.8  8.4 - 45.4 (mg/dL)   GFR calc non Af Amer 66 (*) >90 (mL/min)   GFR calc Af Amer 77 (*) >90 (mL/min)  MAGNESIUM     Status: Normal   Collection Time   10/06/11  5:30 AM      Component Value Range   Magnesium 2.0  1.5 - 2.5 (mg/dL)    Disposition: home.   Follow-up Appts: Discharge Orders    Future Orders Please Complete By Expires   Diet - low sodium heart healthy      Increase activity slowly      Discharge instructions      Comments:   Follow Dr  Juanda Chance in 2 weeks.      Follow-up with Dr. Juanda Chance in 2 weeks.   Tests Needing Follow-up: Coloscopy after completion of antibiotics. Time spent 15 minutes. Signed: Briunna Leicht 10/07/2011, 12:15 AM

## 2011-10-07 NOTE — Progress Notes (Signed)
   CARE MANAGEMENT NOTE 10/07/2011  Patient:  [name], [name]   Account Number:  [id number]  Date Initiated:  10/01/2011  Documentation initiated by:  Onnie Boer  Subjective/Objective Assessment:   PT WAS ADMITTED WITH DIVERTICULITIS     Action/Plan:   PROGRESSION OF CARE AND DISCHARGE PLANNING   Anticipated DC Date:  10/05/2011   Anticipated DC Plan:  HOME/SELF CARE      DC Planning Services  CM consult      Choice offered to / List presented to:             Status of service:  Completed, signed off Medicare Important Message given?   (If response is "NO", the following Medicare IM given date fields will be blank) Date Medicare IM given:   Date Additional Medicare IM given:    Discharge Disposition:  HOME/SELF CARE  Per UR Regulation:  Reviewed for med. necessity/level of care/duration of stay  Comments:  10/07/11 Jaymi Tinner, NR,BSN 1428 PT WAS DC'D TO HOME WITH SELF CARE  UR COMPLETED 10/01/2011 Onnie Boer, RN, BSN [phone] PT WAS ADMITTED WITH DIVERTICULITIS, WILL F/U ON DC NEEDS

## 2011-11-05 ENCOUNTER — Encounter: Payer: Self-pay | Admitting: Internal Medicine

## 2011-11-11 ENCOUNTER — Ambulatory Visit: Payer: Medicare Other | Admitting: Internal Medicine

## 2012-10-19 ENCOUNTER — Encounter (HOSPITAL_COMMUNITY): Payer: Self-pay | Admitting: Emergency Medicine

## 2012-10-19 ENCOUNTER — Emergency Department (HOSPITAL_COMMUNITY)
Admission: EM | Admit: 2012-10-19 | Discharge: 2012-10-19 | Disposition: A | Discharge status: Home / Self Care | Payer: Medicare Other | Attending: Emergency Medicine | Admitting: Emergency Medicine

## 2012-10-19 ENCOUNTER — Emergency Department (HOSPITAL_COMMUNITY): Payer: Medicare Other

## 2012-10-19 DIAGNOSIS — Z79899 Other long term (current) drug therapy: Secondary | ICD-10-CM | POA: Insufficient documentation

## 2012-10-19 DIAGNOSIS — J40 Bronchitis, not specified as acute or chronic: Secondary | ICD-10-CM

## 2012-10-19 DIAGNOSIS — IMO0001 Reserved for inherently not codable concepts without codable children: Secondary | ICD-10-CM | POA: Insufficient documentation

## 2012-10-19 DIAGNOSIS — K219 Gastro-esophageal reflux disease without esophagitis: Secondary | ICD-10-CM | POA: Insufficient documentation

## 2012-10-19 DIAGNOSIS — J3489 Other specified disorders of nose and nasal sinuses: Secondary | ICD-10-CM | POA: Insufficient documentation

## 2012-10-19 DIAGNOSIS — R5381 Other malaise: Secondary | ICD-10-CM | POA: Insufficient documentation

## 2012-10-19 DIAGNOSIS — E7800 Pure hypercholesterolemia, unspecified: Secondary | ICD-10-CM | POA: Insufficient documentation

## 2012-10-19 DIAGNOSIS — Z9861 Coronary angioplasty status: Secondary | ICD-10-CM | POA: Insufficient documentation

## 2012-10-19 DIAGNOSIS — I10 Essential (primary) hypertension: Secondary | ICD-10-CM | POA: Insufficient documentation

## 2012-10-19 DIAGNOSIS — R51 Headache: Secondary | ICD-10-CM | POA: Insufficient documentation

## 2012-10-19 DIAGNOSIS — J069 Acute upper respiratory infection, unspecified: Secondary | ICD-10-CM

## 2012-10-19 DIAGNOSIS — I2510 Atherosclerotic heart disease of native coronary artery without angina pectoris: Secondary | ICD-10-CM | POA: Insufficient documentation

## 2012-10-19 LAB — BASIC METABOLIC PANEL
GFR calc Af Amer: 54 mL/min — ABNORMAL LOW (ref >90)
GFR calc non Af Amer: 47 mL/min — ABNORMAL LOW (ref >90)
Potassium: 3.4 mEq/L — ABNORMAL LOW (ref 3.5–5.1)
Sodium: 138 mEq/L (ref 135–145)

## 2012-10-19 LAB — POCT I-STAT TROPONIN I: Troponin i, poc: 0.01 ng/mL (ref 0.00–0.08)

## 2012-10-19 LAB — CBC
Hemoglobin: 13.2 g/dL (ref 12.0–15.0)
MCHC: 34.1 g/dL (ref 30.0–36.0)
Platelets: 285 10*3/uL (ref 150–400)
RDW: 14.1 % (ref 11.5–15.5)

## 2012-10-19 LAB — PRO B NATRIURETIC PEPTIDE: Pro B Natriuretic peptide (BNP): 760.9 pg/mL — ABNORMAL HIGH (ref 0–125)

## 2012-10-19 MED ORDER — AZITHROMYCIN 250 MG PO TABS: 250.0000 mg | ORAL_TABLET | Freq: Every day | ORAL | Status: DC

## 2012-10-19 MED ORDER — SODIUM CHLORIDE 0.9 % IV BOLUS (SEPSIS): 1000.0000 mL | Freq: Once | INTRAVENOUS | Status: DC

## 2012-10-19 MED ORDER — ALBUTEROL SULFATE HFA 108 (90 BASE) MCG/ACT IN AERS
2.0000 | INHALATION_SPRAY | RESPIRATORY_TRACT | Status: DC | PRN
  Administered 2012-10-19: 2 via RESPIRATORY_TRACT
  Filled 2012-10-19: qty 6.7

## 2012-10-19 MED ORDER — HYDROCOD POLST-CHLORPHEN POLST 10-8 MG/5ML PO LQCR
5.0000 mL | Freq: Once | ORAL | Status: AC
  Administered 2012-10-19: 5 mL via ORAL
  Filled 2012-10-19: qty 5

## 2012-10-19 MED ORDER — ALBUTEROL SULFATE (5 MG/ML) 0.5% IN NEBU
5.0000 mg | INHALATION_SOLUTION | Freq: Once | RESPIRATORY_TRACT | Status: AC
  Administered 2012-10-19: 5 mg via RESPIRATORY_TRACT
  Filled 2012-10-19: qty 1

## 2012-10-19 MED ORDER — HYDROCODONE-ACETAMINOPHEN 7.5-325 MG/15ML PO SOLN: 15.0000 mL | Freq: Four times a day (QID) | ORAL | Status: DC | PRN

## 2012-10-19 MED ORDER — PREDNISONE 20 MG PO TABS
60.0000 mg | ORAL_TABLET | Freq: Once | ORAL | Status: AC
  Administered 2012-10-19: 60 mg via ORAL
  Filled 2012-10-19: qty 3

## 2012-10-19 NOTE — ED Provider Notes (Signed)
History     CSN: [id number]  Arrival date & time 10/19/12  2130   First MD Initiated Contact with Patient 10/19/12 2021      Chief Complaint  Patient presents with  . Cough  . Weakness    (Consider location/radiation/quality/duration/timing/severity/associated sxs/prior treatment) Patient is a 66 y.o. female presenting with cough and weakness. The history is provided by the patient.  Cough This is a new problem. The current episode started more than 1 week ago. The problem occurs constantly. There has been no fever. Associated symptoms include headaches and myalgias. Pertinent negatives include no chest pain. Associated symptoms comments: Symptoms of cough, congestion, off-and-on headaches for 2 weeks. Today she felt increasingly and generally weak. No falls, no syncope. . She is not a smoker.  Weakness The primary symptoms include headaches. Primary symptoms do not include fever or nausea.  The headache is associated with weakness.  Additional symptoms include weakness.    Past Medical History  Diagnosis Date  . Reflux   . Hypercholesteremia   . Hypertension   . Coronary artery disease   . GERD (gastroesophageal reflux disease)     Past Surgical History  Procedure Date  . Joint replacement   . Vascular surgery   . Coronary stent placement   . Abdominal hysterectomy     No family history on file.  History  Substance Use Topics  . Smoking status: Never Smoker   . Smokeless tobacco: Not on file  . Alcohol Use: No    OB History    Grav Para Term Preterm Abortions TAB SAB Ect Mult Living                  Review of Systems  Constitutional: Negative for fever.  HENT: Positive for congestion.   Respiratory: Positive for cough.   Cardiovascular: Negative for chest pain.  Gastrointestinal: Negative for nausea and abdominal pain.  Musculoskeletal: Positive for myalgias.  Neurological: Positive for weakness and headaches.    Allergies  Sulfa drugs cross  reactors and Peanut-containing drug products  Home Medications   Current Outpatient Rx  Name  Route  Sig  Dispense  Refill  . ACETAMINOPHEN 500 MG PO TABS   Oral   Take 1,000 mg by mouth every 6 (six) hours as needed. For pain         . AMLODIPINE BESYLATE 10 MG PO TABS   Oral   Take 10 mg by mouth daily.          . ATORVASTATIN CALCIUM 20 MG PO TABS   Oral   Take 20 mg by mouth daily.           Marland Kitchen BISMUTH SUBSALICYLATE 262 MG/15ML PO SUSP   Oral   Take 15 mLs by mouth as needed. For upset stomach         . VITAMIN D 1000 UNITS PO TABS   Oral   Take 2,000 Units by mouth daily.         Marland Kitchen VITAMIN B 12 PO   Oral   Take 1 capsule by mouth daily.         Marland Kitchen ESOMEPRAZOLE MAGNESIUM 40 MG PO CPDR   Oral   Take 40 mg by mouth 2 (two) times daily.           Marland Kitchen EZETIMIBE 10 MG PO TABS   Oral   Take 10 mg by mouth daily.           Marland Kitchen HYDROCODONE-ACETAMINOPHEN 7.5-750 MG  PO TABS   Oral   Take 1 tablet by mouth every 6 (six) hours as needed.   30 tablet   0   . ADULT MULTIVITAMIN W/MINERALS CH   Oral   Take 1 tablet by mouth as needed. Takes for health once in a while         . OXYBUTYNIN CHLORIDE ER 5 MG PO TB24   Oral   Take 5 mg by mouth daily.          Marland Kitchen POTASSIUM CHLORIDE ER 10 MEQ PO CPCR   Oral   Take 10 mEq by mouth 2 (two) times daily.          Marland Kitchen GAS-X PO   Oral   Take 1 tablet by mouth as needed. For gas/bloating         . TRAMADOL HCL 50 MG PO TABS   Oral   Take 50 mg by mouth 2 (two) times daily as needed. Maximum dose= 8 tablets per day for pain         . VENLAFAXINE HCL ER 150 MG PO CP24   Oral   Take 150 mg by mouth at bedtime.          Marland Kitchen VITAMIN E 1000 UNITS PO CAPS   Oral   Take 1,000 Units by mouth daily.           BP 142/76  Pulse 77  Temp 98.8 F (37.1 C) (Oral)  Resp 20  SpO2 97%  Physical Exam  Constitutional: She is oriented to person, place, and time. She appears well-developed and well-nourished.   HENT:  Head: Normocephalic.  Nose: Mucosal edema present.  Mouth/Throat: Mucous membranes are normal. Posterior oropharyngeal erythema present. No posterior oropharyngeal edema.  Neck: Normal range of motion. Neck supple.  Cardiovascular: Normal rate and regular rhythm.   Pulmonary/Chest: Effort normal and breath sounds normal.  Abdominal: Soft. Bowel sounds are normal. There is no tenderness. There is no rebound and no guarding.  Musculoskeletal: Normal range of motion.  Neurological: She is alert and oriented to person, place, and time.  Skin: Skin is warm and dry. No rash noted.  Psychiatric: She has a normal mood and affect.    ED Course  Procedures (including critical care time)  Labs Reviewed  BASIC METABOLIC PANEL - Abnormal; Notable for the following:    Potassium 3.4 (*)     Glucose, Bld 109 (*)     Creatinine, Ser 1.19 (*)     GFR calc non Af Amer 47 (*)     GFR calc Af Amer 54 (*)     All other components within normal limits  PRO B NATRIURETIC PEPTIDE - Abnormal; Notable for the following:    Pro B Natriuretic peptide (BNP) 760.9 (*)     All other components within normal limits  CBC  POCT I-STAT TROPONIN I   Results for orders placed during the hospital encounter of 10/19/12  CBC      Component Value Range   WBC 4.0  4.0 - 10.5 K/uL   RBC 4.25  3.87 - 5.11 MIL/uL   Hemoglobin 13.2  12.0 - 15.0 g/dL   HCT 16.1  09.6 - 04.5 %   MCV 91.1  78.0 - 100.0 fL   MCH 31.1  26.0 - 34.0 pg   MCHC 34.1  30.0 - 36.0 g/dL   RDW 40.9  81.1 - 91.4 %   Platelets 285  150 - 400 K/uL  BASIC METABOLIC  PANEL      Component Value Range   Sodium 138  135 - 145 mEq/L   Potassium 3.4 (*) 3.5 - 5.1 mEq/L   Chloride 102  96 - 112 mEq/L   CO2 24  19 - 32 mEq/L   Glucose, Bld 109 (*) 70 - 99 mg/dL   BUN 18  6 - 23 mg/dL   Creatinine, Ser 9.60 (*) 0.50 - 1.10 mg/dL   Calcium 9.5  8.4 - 45.4 mg/dL   GFR calc non Af Amer 47 (*) >90 mL/min   GFR calc Af Amer 54 (*) >90 mL/min  PRO  B NATRIURETIC PEPTIDE      Component Value Range   Pro B Natriuretic peptide (BNP) 760.9 (*) 0 - 125 pg/mL  POCT I-STAT TROPONIN I      Component Value Range   Troponin i, poc 0.01  0.00 - 0.08 ng/mL   Comment 3             Dg Chest 2 View  10/19/2012  *RADIOLOGY REPORT*  Clinical Data: 2-week history of cough, intermittently productive. Chest congestion.  Fever.  Prior history of coronary artery disease with stenting.  CHEST - 2 VIEW  Comparison: Two-view chest x-ray 08/21/2011, 07/19/2011.  Findings: Cardiac silhouette normal in size.  Thoracic aorta mildly atherosclerotic, unchanged.  Hilar and mediastinal contours otherwise unremarkable.  Moderate central peribronchial thickening, more so than on the prior examinations.  No localized airspace consolidation.  No pleural effusions.  Visualized bony thorax intact.  IMPRESSION: Mild to moderate changes of acute bronchitis and/or asthma without localized airspace pneumonia.   Original Report Authenticated By: Hulan Saas, M.D.      No diagnosis found. 1. Uri 2. Bronchitis    MDM  Feeling better after IV fluids, cough better with medications. Will opt to treat with abx given duration of symptoms. Inhaler provided. VSS.        Rodena Medin, PA-C 10/19/12 [id number]

## 2012-10-19 NOTE — ED Provider Notes (Signed)
Medical screening examination/treatment/procedure(s) were conducted as a shared visit with non-physician practitioner(s) and myself.  I personally evaluated the patient during the encounter  Pt with uri type symptoms.  No pna.  Doubt CHF.  Improved in ED with symptomatic treatment.  Celene Kras, MD 10/19/12 [phone]

## 2012-10-19 NOTE — ED Notes (Signed)
C/o cough, SOB X2w, today c/o HA and dizzyness, sts chest discomfort, no V/D, no OTC meds pta, NAD

## 2013-04-16 ENCOUNTER — Encounter (HOSPITAL_COMMUNITY): Payer: Self-pay | Admitting: *Deleted

## 2013-04-16 ENCOUNTER — Emergency Department (INDEPENDENT_AMBULATORY_CARE_PROVIDER_SITE_OTHER)
Admission: EM | Admit: 2013-04-16 | Discharge: 2013-04-16 | Disposition: A | Discharge status: Home / Self Care | Payer: Medicare Other | Source: Home / Self Care | Attending: Family Medicine | Admitting: Family Medicine

## 2013-04-16 DIAGNOSIS — N390 Urinary tract infection, site not specified: Secondary | ICD-10-CM

## 2013-04-16 HISTORY — DX: Diverticulitis of intestine, part unspecified, without perforation or abscess without bleeding: K57.92

## 2013-04-16 LAB — POCT URINALYSIS DIP (DEVICE)
Leukocytes, UA: NEGATIVE
Protein, ur: 100 mg/dL — AB
Urobilinogen, UA: 1 mg/dL (ref 0.0–1.0)
pH: 5.5 (ref 5.0–8.0)

## 2013-04-16 MED ORDER — CEPHALEXIN 500 MG PO CAPS: 500.0000 mg | ORAL_CAPSULE | Freq: Four times a day (QID) | ORAL | Status: DC

## 2013-04-16 NOTE — ED Notes (Signed)
C/O dysuria, frequent urination, low back pain x 3 days without fever.  Denies hematuria or abd pain.

## 2013-04-16 NOTE — ED Provider Notes (Signed)
History     CSN: [id number]  Arrival date & time 04/16/13  1133   First MD Initiated Contact with Patient 04/16/13 1218      Chief Complaint  Patient presents with  . Urinary Tract Infection    (Consider location/radiation/quality/duration/timing/severity/associated sxs/prior treatment) Patient is a 67 y.o. female presenting with urinary tract infection. The history is provided by the patient.  Urinary Tract Infection This is a new problem. The current episode started 2 days ago (h/o freq uti problems). The problem has been gradually worsening. Pertinent negatives include no chest pain and no abdominal pain.    Past Medical History  Diagnosis Date  . Reflux   . Hypercholesteremia   . Hypertension   . Coronary artery disease   . GERD (gastroesophageal reflux disease)   . Diverticulitis     Past Surgical History  Procedure Laterality Date  . Joint replacement    . Vascular surgery    . Coronary stent placement    . Abdominal hysterectomy      No family history on file.  History  Substance Use Topics  . Smoking status: Never Smoker   . Smokeless tobacco: Not on file  . Alcohol Use: No    OB History   Grav Para Term Preterm Abortions TAB SAB Ect Mult Living                  Review of Systems  Constitutional: Negative.  Negative for fever and chills.  Cardiovascular: Negative for chest pain.  Gastrointestinal: Negative.  Negative for abdominal pain.  Genitourinary: Positive for dysuria, urgency and frequency. Negative for hematuria, vaginal bleeding, vaginal discharge and menstrual problem.    Allergies  Sulfa drugs cross reactors and Peanut-containing drug products  Home Medications   Current Outpatient Rx  Name  Route  Sig  Dispense  Refill  . acetaminophen (TYLENOL) 500 MG tablet   Oral   Take 1,000 mg by mouth every 6 (six) hours as needed. For pain         . amLODipine (NORVASC) 10 MG tablet   Oral   Take 10 mg by mouth daily.          Marland Kitchen  atorvastatin (LIPITOR) 20 MG tablet   Oral   Take 20 mg by mouth daily.           Marland Kitchen bismuth subsalicylate (PEPTO BISMOL) 262 MG/15ML suspension   Oral   Take 15 mLs by mouth as needed. For upset stomach         . cholecalciferol (VITAMIN D) 1000 UNITS tablet   Oral   Take 2,000 Units by mouth daily.         . Cyanocobalamin (VITAMIN B 12 PO)   Oral   Take 1 capsule by mouth daily.         Marland Kitchen esomeprazole (NEXIUM) 40 MG capsule   Oral   Take 40 mg by mouth 2 (two) times daily.           Marland Kitchen ezetimibe (ZETIA) 10 MG tablet   Oral   Take 10 mg by mouth daily.           . hydrocodone-acetaminophen (HYCET) 7.5-325 MG/15ML solution   Oral   Take 15 mLs by mouth every 6 (six) hours as needed for pain.   80 mL   0   . Multiple Vitamin (MULTIVITAMIN WITH MINERALS) TABS   Oral   Take 1 tablet by mouth as needed. Takes for health once in  a while         . oxybutynin (DITROPAN-XL) 5 MG 24 hr tablet   Oral   Take 5 mg by mouth daily.          . potassium chloride (MICRO-K) 10 MEQ CR capsule   Oral   Take 10 mEq by mouth 2 (two) times daily.          . Simethicone (GAS-X PO)   Oral   Take 1 tablet by mouth as needed. For gas/bloating         . traMADol (ULTRAM) 50 MG tablet   Oral   Take 50 mg by mouth 2 (two) times daily as needed. Maximum dose= 8 tablets per day for pain         . venlafaxine (EFFEXOR-XR) 150 MG 24 hr capsule   Oral   Take 150 mg by mouth at bedtime.          . vitamin E (VITAMIN E) 1000 UNIT capsule   Oral   Take 1,000 Units by mouth daily.         Marland Kitchen azithromycin (ZITHROMAX) 250 MG tablet   Oral   Take 1 tablet (250 mg total) by mouth daily. Take first 2 tablets together, then 1 every day until finished.   6 tablet   0   . cephALEXin (KEFLEX) 500 MG capsule   Oral   Take 1 capsule (500 mg total) by mouth 4 (four) times daily. Take all of medicine and drink lots of fluids   20 capsule   0   . HYDROcodone-acetaminophen  (VICODIN ES) 7.5-750 MG per tablet   Oral   Take 1 tablet by mouth every 6 (six) hours as needed.   30 tablet   0     BP 156/77  Pulse 75  Temp(Src) 98.4 F (36.9 C) (Oral)  Resp 16  SpO2 97%  Physical Exam  Nursing note and vitals reviewed. Constitutional: She is oriented to person, place, and time. She appears well-developed and well-nourished.  Abdominal: Soft. Bowel sounds are normal. There is no tenderness.  Neurological: She is alert and oriented to person, place, and time.  Skin: Skin is warm and dry.    ED Course  Procedures (including critical care time)  Labs Reviewed  POCT URINALYSIS DIP (DEVICE) - Abnormal; Notable for the following:    Ketones, ur TRACE (*)    Protein, ur 100 (*)    All other components within normal limits   No results found.   1. UTI (lower urinary tract infection)       MDM  U/a reviewed        Linna Hoff, MD 04/16/13 1239

## 2014-05-17 ENCOUNTER — Encounter (HOSPITAL_COMMUNITY)
Admission: EM | Disposition: A | Discharge status: Home / Self Care | Payer: Self-pay | Source: Home / Self Care | Attending: Emergency Medicine

## 2014-05-17 ENCOUNTER — Observation Stay (HOSPITAL_COMMUNITY)
Admission: EM | Admit: 2014-05-17 | Discharge: 2014-05-18 | Disposition: A | Discharge status: Home / Self Care | Payer: Medicare Other | Attending: Interventional Cardiology | Admitting: Interventional Cardiology

## 2014-05-17 ENCOUNTER — Emergency Department (HOSPITAL_COMMUNITY): Payer: Medicare Other

## 2014-05-17 ENCOUNTER — Encounter (HOSPITAL_COMMUNITY): Payer: Self-pay | Admitting: Emergency Medicine

## 2014-05-17 DIAGNOSIS — K5732 Diverticulitis of large intestine without perforation or abscess without bleeding: Secondary | ICD-10-CM | POA: Diagnosis not present

## 2014-05-17 DIAGNOSIS — I498 Other specified cardiac arrhythmias: Secondary | ICD-10-CM | POA: Insufficient documentation

## 2014-05-17 DIAGNOSIS — M797 Fibromyalgia: Secondary | ICD-10-CM | POA: Diagnosis present

## 2014-05-17 DIAGNOSIS — N390 Urinary tract infection, site not specified: Secondary | ICD-10-CM | POA: Diagnosis not present

## 2014-05-17 DIAGNOSIS — Z86718 Personal history of other venous thrombosis and embolism: Secondary | ICD-10-CM | POA: Diagnosis not present

## 2014-05-17 DIAGNOSIS — E7800 Pure hypercholesterolemia, unspecified: Secondary | ICD-10-CM | POA: Diagnosis not present

## 2014-05-17 DIAGNOSIS — I10 Essential (primary) hypertension: Secondary | ICD-10-CM | POA: Diagnosis not present

## 2014-05-17 DIAGNOSIS — I25119 Atherosclerotic heart disease of native coronary artery with unspecified angina pectoris: Secondary | ICD-10-CM

## 2014-05-17 DIAGNOSIS — K219 Gastro-esophageal reflux disease without esophagitis: Secondary | ICD-10-CM | POA: Diagnosis present

## 2014-05-17 DIAGNOSIS — R0602 Shortness of breath: Secondary | ICD-10-CM | POA: Diagnosis not present

## 2014-05-17 DIAGNOSIS — Z9861 Coronary angioplasty status: Secondary | ICD-10-CM | POA: Diagnosis not present

## 2014-05-17 DIAGNOSIS — I252 Old myocardial infarction: Secondary | ICD-10-CM | POA: Diagnosis not present

## 2014-05-17 DIAGNOSIS — Z86711 Personal history of pulmonary embolism: Secondary | ICD-10-CM | POA: Diagnosis not present

## 2014-05-17 DIAGNOSIS — I2510 Atherosclerotic heart disease of native coronary artery without angina pectoris: Principal | ICD-10-CM | POA: Insufficient documentation

## 2014-05-17 DIAGNOSIS — I25110 Atherosclerotic heart disease of native coronary artery with unstable angina pectoris: Secondary | ICD-10-CM

## 2014-05-17 DIAGNOSIS — I200 Unstable angina: Secondary | ICD-10-CM | POA: Diagnosis present

## 2014-05-17 DIAGNOSIS — R079 Chest pain, unspecified: Secondary | ICD-10-CM

## 2014-05-17 DIAGNOSIS — IMO0001 Reserved for inherently not codable concepts without codable children: Secondary | ICD-10-CM | POA: Insufficient documentation

## 2014-05-17 DIAGNOSIS — E876 Hypokalemia: Secondary | ICD-10-CM | POA: Insufficient documentation

## 2014-05-17 DIAGNOSIS — R61 Generalized hyperhidrosis: Secondary | ICD-10-CM | POA: Diagnosis not present

## 2014-05-17 DIAGNOSIS — K5792 Diverticulitis of intestine, part unspecified, without perforation or abscess without bleeding: Secondary | ICD-10-CM | POA: Diagnosis present

## 2014-05-17 DIAGNOSIS — M1990 Unspecified osteoarthritis, unspecified site: Secondary | ICD-10-CM | POA: Diagnosis present

## 2014-05-17 DIAGNOSIS — Z9660 Presence of unspecified orthopedic joint implant: Secondary | ICD-10-CM | POA: Insufficient documentation

## 2014-05-17 DIAGNOSIS — R569 Unspecified convulsions: Secondary | ICD-10-CM | POA: Diagnosis present

## 2014-05-17 HISTORY — DX: Cluster headache syndrome, unspecified, not intractable: G44.009

## 2014-05-17 HISTORY — DX: Unspecified convulsions: R56.9

## 2014-05-17 HISTORY — PX: CARDIAC CATHETERIZATION: SHX172

## 2014-05-17 HISTORY — PX: LEFT HEART CATHETERIZATION WITH CORONARY ANGIOGRAM: SHX5451

## 2014-05-17 HISTORY — DX: Low back pain, unspecified: M54.50

## 2014-05-17 HISTORY — DX: Other chronic pain: G89.29

## 2014-05-17 HISTORY — DX: Unspecified osteoarthritis, unspecified site: M19.90

## 2014-05-17 HISTORY — DX: Urinary tract infection, site not specified: N39.0

## 2014-05-17 HISTORY — DX: Low back pain: M54.5

## 2014-05-17 LAB — COMPREHENSIVE METABOLIC PANEL
ALK PHOS: 65 U/L (ref 39–117)
ALT: 15 U/L (ref 0–35)
AST: 21 U/L (ref 0–37)
Albumin: 4 g/dL (ref 3.5–5.2)
Anion gap: 14 (ref 5–15)
BUN: 16 mg/dL (ref 6–23)
CALCIUM: 9.2 mg/dL (ref 8.4–10.5)
CO2: 26 meq/L (ref 19–32)
Chloride: 102 mEq/L (ref 96–112)
Creatinine, Ser: 1.58 mg/dL — ABNORMAL HIGH (ref 0.50–1.10)
GFR, EST AFRICAN AMERICAN: 38 mL/min — AB (ref >90)
GFR, EST NON AFRICAN AMERICAN: 33 mL/min — AB (ref >90)
GLUCOSE: 96 mg/dL (ref 70–99)
POTASSIUM: 3.2 meq/L — AB (ref 3.7–5.3)
SODIUM: 142 meq/L (ref 137–147)
Total Bilirubin: 0.4 mg/dL (ref 0.3–1.2)
Total Protein: 7.6 g/dL (ref 6.0–8.3)

## 2014-05-17 LAB — CBC WITH DIFFERENTIAL/PLATELET
Basophils Absolute: 0 10*3/uL (ref 0.0–0.1)
Basophils Relative: 0 % (ref 0–1)
EOS PCT: 6 % — AB (ref 0–5)
Eosinophils Absolute: 0.3 10*3/uL (ref 0.0–0.7)
HCT: 36.6 % (ref 36.0–46.0)
HEMOGLOBIN: 11.9 g/dL — AB (ref 12.0–15.0)
LYMPHS ABS: 2.6 10*3/uL (ref 0.7–4.0)
LYMPHS PCT: 45 % (ref 12–46)
MCH: 31.1 pg (ref 26.0–34.0)
MCHC: 32.5 g/dL (ref 30.0–36.0)
MCV: 95.6 fL (ref 78.0–100.0)
Monocytes Absolute: 0.5 10*3/uL (ref 0.1–1.0)
Monocytes Relative: 8 % (ref 3–12)
Neutro Abs: 2.4 10*3/uL (ref 1.7–7.7)
Neutrophils Relative %: 41 % — ABNORMAL LOW (ref 43–77)
PLATELETS: 242 10*3/uL (ref 150–400)
RBC: 3.83 MIL/uL — AB (ref 3.87–5.11)
RDW: 14.4 % (ref 11.5–15.5)
WBC: 5.9 10*3/uL (ref 4.0–10.5)

## 2014-05-17 LAB — BASIC METABOLIC PANEL
ANION GAP: 12 (ref 5–15)
BUN: 14 mg/dL (ref 6–23)
CO2: 26 mEq/L (ref 19–32)
CREATININE: 0.97 mg/dL (ref 0.50–1.10)
Calcium: 9.1 mg/dL (ref 8.4–10.5)
Chloride: 105 mEq/L (ref 96–112)
GFR, EST AFRICAN AMERICAN: 69 mL/min — AB (ref >90)
GFR, EST NON AFRICAN AMERICAN: 59 mL/min — AB (ref >90)
Glucose, Bld: 83 mg/dL (ref 70–99)
Potassium: 3.4 mEq/L — ABNORMAL LOW (ref 3.7–5.3)
SODIUM: 143 meq/L (ref 137–147)

## 2014-05-17 LAB — I-STAT TROPONIN, ED: Troponin i, poc: 0.01 ng/mL (ref 0.00–0.08)

## 2014-05-17 LAB — URINE MICROSCOPIC-ADD ON

## 2014-05-17 LAB — URINALYSIS, ROUTINE W REFLEX MICROSCOPIC
Bilirubin Urine: NEGATIVE
GLUCOSE, UA: NEGATIVE mg/dL
HGB URINE DIPSTICK: NEGATIVE
Ketones, ur: 15 mg/dL — AB
Nitrite: NEGATIVE
PH: 5.5 (ref 5.0–8.0)
Protein, ur: 100 mg/dL — AB
SPECIFIC GRAVITY, URINE: 1.03 (ref 1.005–1.030)
UROBILINOGEN UA: 1 mg/dL (ref 0.0–1.0)

## 2014-05-17 LAB — PROTIME-INR
INR: 1.15 (ref 0.00–1.49)
PROTHROMBIN TIME: 14.7 s (ref 11.6–15.2)

## 2014-05-17 LAB — TSH: TSH: 0.654 u[IU]/mL (ref 0.350–4.500)

## 2014-05-17 LAB — TROPONIN I
Troponin I: <0.3 ng/mL (ref <0.30)
Troponin I: <0.3 ng/mL (ref <0.30)

## 2014-05-17 LAB — HEMOGLOBIN A1C
Hgb A1c MFr Bld: 5.9 % — ABNORMAL HIGH (ref <5.7)
MEAN PLASMA GLUCOSE: 123 mg/dL — AB (ref <117)

## 2014-05-17 SURGERY — LEFT HEART CATHETERIZATION WITH CORONARY ANGIOGRAM
Anesthesia: LOCAL

## 2014-05-17 MED ORDER — SODIUM CHLORIDE 0.9 % IJ SOLN: 3.0000 mL | Freq: Two times a day (BID) | INTRAMUSCULAR | Status: DC

## 2014-05-17 MED ORDER — VENLAFAXINE HCL ER 150 MG PO CP24
150.0000 mg | ORAL_CAPSULE | Freq: Every day | ORAL | Status: DC
  Administered 2014-05-17: 20:00:00 150 mg via ORAL
  Filled 2014-05-17 (×2): qty 1

## 2014-05-17 MED ORDER — EZETIMIBE 10 MG PO TABS
10.0000 mg | ORAL_TABLET | Freq: Every day | ORAL | Status: DC
  Administered 2014-05-17 – 2014-05-18 (×2): 10 mg via ORAL
  Filled 2014-05-17 (×2): qty 1

## 2014-05-17 MED ORDER — OXYBUTYNIN CHLORIDE ER 5 MG PO TB24
5.0000 mg | ORAL_TABLET | Freq: Every day | ORAL | Status: DC
  Administered 2014-05-17 – 2014-05-18 (×2): 5 mg via ORAL
  Filled 2014-05-17 (×3): qty 1

## 2014-05-17 MED ORDER — FENTANYL CITRATE 0.05 MG/ML IJ SOLN
INTRAMUSCULAR | Status: AC
  Filled 2014-05-17: qty 2

## 2014-05-17 MED ORDER — ACETAMINOPHEN 325 MG PO TABS
650.0000 mg | ORAL_TABLET | ORAL | Status: DC | PRN
  Administered 2014-05-17 – 2014-05-18 (×2): 650 mg via ORAL
  Filled 2014-05-17 (×2): qty 2

## 2014-05-17 MED ORDER — ATORVASTATIN CALCIUM 20 MG PO TABS
20.0000 mg | ORAL_TABLET | Freq: Every day | ORAL | Status: DC
  Administered 2014-05-17 – 2014-05-18 (×2): 20 mg via ORAL
  Filled 2014-05-17 (×2): qty 1

## 2014-05-17 MED ORDER — SODIUM CHLORIDE 0.9 % IV SOLN
1.0000 mL/kg/h | INTRAVENOUS | Status: DC
  Administered 2014-05-17: 1 mL/kg/h via INTRAVENOUS

## 2014-05-17 MED ORDER — NITROGLYCERIN 0.4 MG SL SUBL: 0.4000 mg | SUBLINGUAL_TABLET | SUBLINGUAL | Status: DC | PRN

## 2014-05-17 MED ORDER — SODIUM CHLORIDE 0.9 % IJ SOLN: 3.0000 mL | INTRAMUSCULAR | Status: DC | PRN

## 2014-05-17 MED ORDER — MIDAZOLAM HCL 2 MG/2ML IJ SOLN
INTRAMUSCULAR | Status: AC
  Filled 2014-05-17: qty 2

## 2014-05-17 MED ORDER — VERAPAMIL HCL 2.5 MG/ML IV SOLN
INTRAVENOUS | Status: AC
  Filled 2014-05-17: qty 2

## 2014-05-17 MED ORDER — DEXTROSE 5 % IV SOLN
1.0000 g | Freq: Once | INTRAVENOUS | Status: AC
  Administered 2014-05-17: 1 g via INTRAVENOUS
  Filled 2014-05-17: qty 10

## 2014-05-17 MED ORDER — SODIUM CHLORIDE 0.9 % IV SOLN: 1.0000 mL/kg/h | INTRAVENOUS | Status: AC

## 2014-05-17 MED ORDER — PANTOPRAZOLE SODIUM 40 MG PO TBEC
40.0000 mg | DELAYED_RELEASE_TABLET | Freq: Every day | ORAL | Status: DC
  Administered 2014-05-17 – 2014-05-18 (×2): 40 mg via ORAL
  Filled 2014-05-17 (×2): qty 1

## 2014-05-17 MED ORDER — POTASSIUM CHLORIDE CRYS ER 10 MEQ PO TBCR
40.0000 meq | EXTENDED_RELEASE_TABLET | Freq: Once | ORAL | Status: AC
  Administered 2014-05-17: 20:00:00 40 meq via ORAL
  Filled 2014-05-17: qty 4

## 2014-05-17 MED ORDER — TRAMADOL HCL 50 MG PO TABS
50.0000 mg | ORAL_TABLET | Freq: Two times a day (BID) | ORAL | Status: DC | PRN
  Administered 2014-05-17 – 2014-05-18 (×2): 50 mg via ORAL
  Filled 2014-05-17 (×2): qty 1

## 2014-05-17 MED ORDER — ASPIRIN 81 MG PO CHEW: 81.0000 mg | CHEWABLE_TABLET | ORAL | Status: DC

## 2014-05-17 MED ORDER — POTASSIUM CHLORIDE CRYS ER 20 MEQ PO TBCR
30.0000 meq | EXTENDED_RELEASE_TABLET | Freq: Once | ORAL | Status: DC
  Filled 2014-05-17: qty 1

## 2014-05-17 MED ORDER — METOPROLOL TARTRATE 12.5 MG HALF TABLET
12.5000 mg | ORAL_TABLET | Freq: Two times a day (BID) | ORAL | Status: DC
  Administered 2014-05-17 – 2014-05-18 (×3): 12.5 mg via ORAL
  Filled 2014-05-17 (×4): qty 1

## 2014-05-17 MED ORDER — ISOSORBIDE MONONITRATE ER 60 MG PO TB24
120.0000 mg | ORAL_TABLET | Freq: Every day | ORAL | Status: DC
  Administered 2014-05-17 – 2014-05-18 (×2): 120 mg via ORAL
  Filled 2014-05-17 (×2): qty 2

## 2014-05-17 MED ORDER — SODIUM CHLORIDE 0.9 % IV SOLN
Freq: Once | INTRAVENOUS | Status: AC
  Administered 2014-05-17: 08:00:00 via INTRAVENOUS

## 2014-05-17 MED ORDER — PNEUMOCOCCAL VAC POLYVALENT 25 MCG/0.5ML IJ INJ
0.5000 mL | INJECTION | INTRAMUSCULAR | Status: DC
  Filled 2014-05-17: qty 0.5

## 2014-05-17 MED ORDER — NITROGLYCERIN 1 MG/10 ML FOR IR/CATH LAB
INTRA_ARTERIAL | Status: AC
  Filled 2014-05-17: qty 10

## 2014-05-17 MED ORDER — POTASSIUM CHLORIDE CRYS ER 20 MEQ PO TBCR
30.0000 meq | EXTENDED_RELEASE_TABLET | Freq: Once | ORAL | Status: AC
  Administered 2014-05-17: 12:00:00 30 meq via ORAL
  Filled 2014-05-17: qty 1

## 2014-05-17 MED ORDER — SODIUM CHLORIDE 0.9 % IV SOLN
250.0000 mL | INTRAVENOUS | Status: DC | PRN
Start: 2014-05-17 — End: 2014-05-18

## 2014-05-17 MED ORDER — ONDANSETRON HCL 4 MG/2ML IJ SOLN: 4.0000 mg | Freq: Four times a day (QID) | INTRAMUSCULAR | Status: DC | PRN

## 2014-05-17 MED ORDER — SODIUM CHLORIDE 0.9 % IV SOLN: 250.0000 mL | INTRAVENOUS | Status: DC | PRN

## 2014-05-17 MED ORDER — ASPIRIN 81 MG PO TBEC: 81.0000 mg | DELAYED_RELEASE_TABLET | Freq: Every day | ORAL | Status: DC

## 2014-05-17 MED ORDER — AMLODIPINE BESYLATE 10 MG PO TABS
10.0000 mg | ORAL_TABLET | Freq: Every day | ORAL | Status: DC
  Administered 2014-05-17 – 2014-05-18 (×2): 10 mg via ORAL
  Filled 2014-05-17 (×2): qty 1

## 2014-05-17 MED ORDER — ASPIRIN 81 MG PO CHEW
324.0000 mg | CHEWABLE_TABLET | Freq: Once | ORAL | Status: AC
  Administered 2014-05-17: 324 mg via ORAL
  Filled 2014-05-17: qty 4

## 2014-05-17 MED ORDER — HEPARIN (PORCINE) IN NACL 2-0.9 UNIT/ML-% IJ SOLN
INTRAMUSCULAR | Status: AC
  Filled 2014-05-17: qty 1500

## 2014-05-17 MED ORDER — HEPARIN SODIUM (PORCINE) 1000 UNIT/ML IJ SOLN
INTRAMUSCULAR | Status: AC
  Filled 2014-05-17: qty 1

## 2014-05-17 MED ORDER — ASPIRIN EC 81 MG PO TBEC
81.0000 mg | DELAYED_RELEASE_TABLET | Freq: Every day | ORAL | Status: DC
  Administered 2014-05-18: 09:00:00 81 mg via ORAL
  Filled 2014-05-17: qty 1

## 2014-05-17 MED ORDER — LIDOCAINE HCL (PF) 1 % IJ SOLN
INTRAMUSCULAR | Status: AC
  Filled 2014-05-17: qty 30

## 2014-05-17 NOTE — ED Notes (Signed)
PA Hager with Cardiology at bedside.

## 2014-05-17 NOTE — ED Provider Notes (Signed)
CSN: [id number]     Arrival date & time 05/17/14  0113 History   First MD Initiated Contact with Patient 05/17/14 0342     No chief complaint on file.    (Consider location/radiation/quality/duration/timing/severity/associated sxs/prior Treatment) HPI Patient's primary reason for presentation is 2 days of bladder discomfort and dysuria. She's had no fever or chills. She denies any hematuria. She's had no vomiting or nausea. She's had previous urinary tract infections and symptoms are similar the past.  Her secondary complaint is to have her "heart checked out". She states that she has chronic chest pain but that is worsened over the past few weeks. She states she's been taking her nitroglycerin to 3 times daily. The pain when it comes on presents as substernal chest pain that is described as pressure. This sometimes radiates to her left arm. She is currently having no chest pain. She has no shortness of breath. She has no nausea or vomiting associated with it. She has not seen her cardiologist in some time. She denies any lower extremity swelling or pain. Past Medical History  Diagnosis Date  . Reflux   . Hypercholesteremia   . Hypertension   . Coronary artery disease   . GERD (gastroesophageal reflux disease)   . Diverticulitis   . UTI (lower urinary tract infection)    Past Surgical History  Procedure Laterality Date  . Joint replacement    . Vascular surgery    . Coronary stent placement    . Abdominal hysterectomy     No family history on file. History  Substance Use Topics  . Smoking status: Never Smoker   . Smokeless tobacco: Not on file  . Alcohol Use: No   OB History   Grav Para Term Preterm Abortions TAB SAB Ect Mult Living                 Review of Systems  Constitutional: Negative for fever and chills.  Respiratory: Negative for cough and shortness of breath.   Cardiovascular: Positive for chest pain.  Gastrointestinal: Negative for nausea, vomiting, abdominal  pain, diarrhea and constipation.  Genitourinary: Positive for dysuria, frequency and difficulty urinating. Negative for hematuria, vaginal bleeding, vaginal discharge and pelvic pain.  Musculoskeletal: Negative for back pain, neck pain and neck stiffness.  Skin: Negative for rash and wound.  Neurological: Negative for dizziness, weakness, light-headedness, numbness and headaches.  All other systems reviewed and are negative.     Allergies  Sulfa drugs cross reactors and Peanut-containing drug products  Home Medications   Prior to Admission medications   Medication Sig Start Date End Date Taking? Authorizing Provider  acetaminophen (TYLENOL) 500 MG tablet Take 1,000 mg by mouth every 6 (six) hours as needed. For pain    Historical Provider, MD  amLODipine (NORVASC) 10 MG tablet Take 10 mg by mouth daily.     Historical Provider, MD  atorvastatin (LIPITOR) 20 MG tablet Take 20 mg by mouth daily.      Historical Provider, MD  azithromycin (ZITHROMAX) 250 MG tablet Take 1 tablet (250 mg total) by mouth daily. Take first 2 tablets together, then 1 every day until finished. 10/19/12   Shari A Upstill, PA-C  bismuth subsalicylate (PEPTO BISMOL) 262 MG/15ML suspension Take 15 mLs by mouth as needed. For upset stomach    Historical Provider, MD  cephALEXin (KEFLEX) 500 MG capsule Take 1 capsule (500 mg total) by mouth 4 (four) times daily. Take all of medicine and drink lots of fluids 04/16/13  Linna HoffJames D Kindl, MD  cholecalciferol (VITAMIN D) 1000 UNITS tablet Take 2,000 Units by mouth daily.    Historical Provider, MD  Cyanocobalamin (VITAMIN B 12 PO) Take 1 capsule by mouth daily.    Historical Provider, MD  esomeprazole (NEXIUM) 40 MG capsule Take 40 mg by mouth 2 (two) times daily.      Historical Provider, MD  ezetimibe (ZETIA) 10 MG tablet Take 10 mg by mouth daily.      Historical Provider, MD  hydrocodone-acetaminophen (HYCET) 7.5-325 MG/15ML solution Take 15 mLs by mouth every 6 (six) hours  as needed for pain. 10/19/12   Shari A Upstill, PA-C  HYDROcodone-acetaminophen (VICODIN ES) 7.5-750 MG per tablet Take 1 tablet by mouth every 6 (six) hours as needed. 10/06/11   Simbiso Ranga, MD  Multiple Vitamin (MULTIVITAMIN WITH MINERALS) TABS Take 1 tablet by mouth as needed. Takes for health once in a while    Historical Provider, MD  oxybutynin (DITROPAN-XL) 5 MG 24 hr tablet Take 5 mg by mouth daily.     Historical Provider, MD  potassium chloride (MICRO-K) 10 MEQ CR capsule Take 10 mEq by mouth 2 (two) times daily.     Historical Provider, MD  Simethicone (GAS-X PO) Take 1 tablet by mouth as needed. For gas/bloating    Historical Provider, MD  traMADol (ULTRAM) 50 MG tablet Take 50 mg by mouth 2 (two) times daily as needed. Maximum dose= 8 tablets per day for pain    Historical Provider, MD  venlafaxine (EFFEXOR-XR) 150 MG 24 hr capsule Take 150 mg by mouth at bedtime.     Historical Provider, MD  vitamin E (VITAMIN E) 1000 UNIT capsule Take 1,000 Units by mouth daily.    Historical Provider, MD   BP 139/71  Pulse 65  Temp(Src) 98.2 F (36.8 C) (Oral)  Resp 14  Ht 5\' 6"  (1.676 m)  Wt 147 lb (66.679 kg)  BMI 23.74 kg/m2  SpO2 97% Physical Exam  Nursing note and vitals reviewed. Constitutional: She is oriented to person, place, and time. She appears well-developed and well-nourished. No distress.  HENT:  Head: Normocephalic and atraumatic.  Mouth/Throat: Oropharynx is clear and moist.  Eyes: EOM are normal. Pupils are equal, round, and reactive to light.  Neck: Normal range of motion. Neck supple.  Cardiovascular: Normal rate and regular rhythm.   Pulmonary/Chest: Effort normal and breath sounds normal. No respiratory distress. She has no wheezes. She has no rales. She exhibits no tenderness.  Abdominal: Soft. Bowel sounds are normal. She exhibits no distension and no mass. There is no tenderness. There is no rebound and no guarding.  Musculoskeletal: Normal range of motion.  She exhibits no edema and no tenderness.  No CVA tenderness bilaterally. No calf swelling or tenderness.  Neurological: She is alert and oriented to person, place, and time.  Moves all extremities without deficit. Sensation is grossly intact.  Skin: Skin is warm and dry. No rash noted. No erythema.  Psychiatric: She has a normal mood and affect. Her behavior is normal.    ED Course  Procedures (including critical care time) Labs Review Labs Reviewed  URINALYSIS, ROUTINE W REFLEX MICROSCOPIC - Abnormal; Notable for the following:    Color, Urine AMBER (*)    APPearance CLOUDY (*)    Ketones, ur 15 (*)    Protein, ur 100 (*)    Leukocytes, UA MODERATE (*)    All other components within normal limits  CBC WITH DIFFERENTIAL - Abnormal; Notable for  the following:    RBC 3.83 (*)    Hemoglobin 11.9 (*)    Neutrophils Relative % 41 (*)    Eosinophils Relative 6 (*)    All other components within normal limits  COMPREHENSIVE METABOLIC PANEL - Abnormal; Notable for the following:    Potassium 3.2 (*)    Creatinine, Ser 1.58 (*)    GFR calc non Af Amer 33 (*)    GFR calc Af Amer 38 (*)    All other components within normal limits  URINE MICROSCOPIC-ADD ON  Rosezena Sensor, ED    Imaging Review Dg Chest Port 1 View  05/17/2014   CLINICAL DATA:  Chest pain.  EXAM: PORTABLE CHEST - 1 VIEW  COMPARISON:  Chest radiograph October 19, 2012  FINDINGS: Cardiac silhouette appears mildly enlarged, mediastinal silhouette is nonsuspicious. No pleural effusions or focal consolidation. Similar mild bronchitic changes. No pneumothorax. Soft tissue planes and included osseous structures are nonsuspicious. Multiple EKG lines overlie the patient and may obscure subtle underlying pathology.  IMPRESSION: Stable cardiomegaly and mild bronchitic changes without focal consolidation.   Electronically Signed   By: Awilda Metro   On: 05/17/2014 04:38     EKG Interpretation   Date/Time:  Thursday May 17 2014 03:50:47 EDT Ventricular Rate:  64 PR Interval:  122 QRS Duration: 103 QT Interval:  458 QTC Calculation: 473 R Axis:   42 Text Interpretation:  Sinus rhythm Left atrial enlargement Borderline  repolarization abnormality Confirmed by Ranae Palms  MD, Tihanna Goodson ([id number]) on  05/17/2014 7:04:54 AM      MDM   Final diagnoses:  None    Discuss with cardiology and they will see the patient in emergency department and likely admit for nuclear stress test.    Loren Racer, MD 05/17/14 [phone]

## 2014-05-17 NOTE — CV Procedure (Signed)
CARDIAC CATHETERIZATION REPORT  NAME:  [name]   MRN: [id number] DOB:  11/26/1945   ADMIT DATE: 05/17/2014 Procedure Date: 05/17/2014  INTERVENTIONAL CARDIOLOGIST: Leonie Man, M.D., MS PRIMARY CARE PROVIDER: Sinclair Grooms, MD PRIMARY CARDIOLOGIST:  Sinclair Grooms, MD  PATIENT:  [name] is a 68 y.o. female with history of mild to moderate CAD who has had an MI secondary to coronary artery spasm in the past. She presented with signs and is concerning for a stable angina is referred for cardiac catheterization.  PRE-OPERATIVE DIAGNOSIS:    Unstable angina  PROCEDURES PERFORMED:    Left Heart Catheterization with Native Coronary Angiography  via Right Radial Artery   Left Ventriculography  PROCEDURE: The patient was brought to the [facility]nd [facility] in the fasting state and prepped and draped in the usual sterile fashion for Right Radial artery access. A modified Allen's test was performed on the right wrist demonstrating excellent collateral flow for radial access.   Sterile technique was used including antiseptics, cap, gloves, gown, hand hygiene, mask and sheet. Skin prep: Chlorhexidine.   Consent: Risks of procedure as well as the alternatives and risks of each were explained to the (patient/caregiver). Consent for procedure obtained.   Time Out: Verified patient identification, verified procedure, site/side was marked, verified correct patient position, special equipment/implants available, medications/allergies/relevent history reviewed, required imaging and test results available. Performed.  Access:   Right Radial Artery: 6 Fr Sheath -  Seldinger Technique (Angiocath Micropuncture Kit)  Radial Cocktail - 10 mL; IV Heparin 3500 Units   Left Heart Catheterization: 5 Fr Catheters advanced or exchanged over a Long Exchange Safety J-wire; TIG 4.0 first.  Left and Right Coronary Artery Cineangiography: TIG 4.0 Catheter   LV  Hemodynamics (LV Gram): Angled Pigtail  Sheath removed in the  Cardiac Catheterization Lab with TR band placed for nonocclusive hemostasis.  TR Band:  1435   Hours; 13 mL air  FINDINGS:  Hemodynamics:   Central Aortic Pressure / Mean:  115/65/86 mmHg  Left Ventricular Pressure / LVEDP:  114/6/14 mmHg  Left Ventriculography:  EF:  50 - 55  %  Wall Motion:  mid inferior hypokinesis   Coronary Anatomy:  Dominance: Right   Left Main: Large caliber vessel that bifurcates into the LAD and Circumflex; angiographically normal  LAD:  large caliber vessel it gives off 2 small moderate caliber diagonal branches. Just after the takeoff of the second branch there is a roughly 40-50% stenosis. The vessel he continues on with minimal luminal regular is as it reaches around the apex. The vessel tapers quite dramatically but responded well to nitroglycerin.  Both diagonal branches are small moderate in caliber with the D1 being slightly larger than D2  Left Circumflex:  large caliber, nondominant vessel which essentially courses is a large inferolateral OM branch. They're to proximal OM 1 and OM 2 branches that are small moderate and small in caliber respectively. The vessel then bifurcates into OM3 and OM4.  OM 3 is moderate caliber with minimal luminal irregularities while to follow on branch coursing into OM 4 has a focal eccentric 30-40% stenosis.    RCA:  large caliber, dominant vessel with a slightly anterior takeoff. There is a roughly 40% diffuse stenosis in the mid vessel. The vessel and biphasic distally into the Right Posterior Descending Artery (RPDA) and the Right Posterior AV Groove Branch (RPAV). Mild luminal irregularities otherwise.   RPDA:  moderate-caliber vessel which reaches almost all  the way to the apex. Tortuous but free of any significant disease.  RPL Sysytem:The RPAV  begin is a moderate caliber vessel it gives rise to several posterior lateral branches at least 2 are  significant branches but still are small diameter. Mild diffuse luminal irregularities but otherwise no significant disease.   PATIENT DISPOSITION:    The patient was transferred to the PACU holding area in a hemodynamicaly stable, chest pain free condition.  The patient tolerated the procedure well, and there were no complications.  EBL:   <  10  ml  The patient was stable before, during, and after the procedure.  POST-OPERATIVE DIAGNOSIS:    Mild-to-moderate diffuse CAD but no culprit lesion to explain unstable angina.  Low normal LVEF with normal EP   there was response to the coronary nitroglycerin in the LAD especially.  PLAN OF CARE:  Return to the nursing unit for standard post radial Care.   Would continue to optimize medical therapy.   Anticipate discharge either later on today or tomorrow depending primary team evaluation.    Leonie Man, M.D., M.S. [facility] [address] [place], [place]  [address]  [phone]  05/17/2014 2:53 PM

## 2014-05-17 NOTE — ED Notes (Signed)
Pt denies chest pain at this time.  Pt states she took 1 Nitro last night due to a heaviness she felt in her heart, the pain subsided.  Pt states the pain in her chest came on while she was at rest.  Pt states she has taken the nitro multiple times in the past week due to the pain she has had in her chest that can very from heaviness, pressure to a sharp pain.  Family history of MI (Mother, Father Sisters x 3 and Brother).

## 2014-05-17 NOTE — Discharge Summary (Signed)
Discharge Summary   Patient ID: [name] MRN: [id number], DOB/AGE: 67-Jul-1947 67 y.o. Admit date: 05/17/2014 D/C date:     05/18/2014  Primary Cardiologist: Dr. Pernell Dupre  Principal Problem:   Unstable angina Active Problems:   Diverticulitis   HTN (hypertension)   GERD (gastroesophageal reflux disease)   UTI (lower urinary tract infection)   CAD (coronary artery disease)   History of DVT (deep vein thrombosis)   History of pulmonary embolism   Seizures   Arthritis   Fibromyalgia   Hypercholesteremia   Discharge Diagnosis: Chest pain worrisome for [place] s/p LHC with non obstructive disease and no culprit leison.  HPI: [name] is a 68 y.o. female with a history of CAD, trigeminy/bigeminy, HLD, HTN and GERD who presented to the [facility] ED on 05/17/14 with complaints of UTI as well as chest pain.   She had MI in 2003 thought to be secondary to coronary artery spasm. Cardiac cath in 2007 showed an underlying 20% nonflow limiting coronary disease.  She presented initially today for bladder discomfort and dysuria. However, she also reported that she's been using increasingly more nitroglycerin for chest pain which radiates to her left arm. She describes the pain as "heaviness" and at its worst was 9/10 in intensity. This has worsened over the last month. She also reports diaphoresis, shortness of breath and lower extremity edema. She had some hematochezia but that has resolved. This pain occurs with or without exertion. She has not seen Dr. Tamala Julian in several years.   Hospital Course:  Her troponin was neg x1 and her ECG remained non-acute; however, her chest pain was concerning for [place] and she was referred for LHC the same day. She was also noted to be mildly hypokalemic (K 3.2) and her potassium was supplemented. It was 3.4 on discharge. She underwent LHC on 05/17/14 which revealed  Mild-to-moderate diffuse CAD but no culprit lesion to explain unstable angina.  Low normal LVEF with normal  EP  there was response to the coronary nitroglycerin in the LAD especially.  The patient has had an uncomplicated hospital course and is recovering well. The radial catheter site is stable. She has been seen by Dr. Ellyn Hack today and deemed ready for discharge home. All follow-up appointments have been scheduled. Discharge medications are listed below.    Discharge Vitals: Blood pressure 154/97, pulse 74, temperature 98.8 F (37.1 C), temperature source Oral, resp. rate 18, height 5' 6"  (1.676 m), weight 156 lb 12 oz (71.1 kg), SpO2 99.00%.  Labs: Lab Results  Component Value Date   WBC 5.9 05/17/2014   HGB 11.9* 05/17/2014   HCT 36.6 05/17/2014   MCV 95.6 05/17/2014   PLT 242 05/17/2014     Recent Labs Lab 05/17/14 0140 05/17/14 1130  NA 142 143  K 3.2* 3.4*  CL 102 105  CO2 26 26  BUN 16 14  CREATININE 1.58* 0.97  CALCIUM 9.2 9.1  PROT 7.6  --   BILITOT 0.4  --   ALKPHOS 65  --   ALT 15  --   AST 21  --   GLUCOSE 96 83    Recent Labs  05/17/14 1130 05/17/14 1710 05/17/14 2230  TROPONINI <0.30 <0.30 <0.30     Diagnostic Studies/Procedures   Dg Chest Port 1 View  05/17/2014   CLINICAL DATA:  Chest pain.  EXAM: PORTABLE CHEST - 1 VIEW  COMPARISON:  Chest radiograph October 19, 2012  FINDINGS: Cardiac silhouette appears mildly enlarged, mediastinal silhouette  is nonsuspicious. No pleural effusions or focal consolidation. Similar mild bronchitic changes. No pneumothorax. Soft tissue planes and included osseous structures are nonsuspicious. Multiple EKG lines overlie the patient and may obscure subtle underlying pathology.  IMPRESSION: Stable cardiomegaly and mild bronchitic changes without focal consolidation.      CARDIAC CATHETERIZATION REPORT  NAME: [name] MRN: [id number]  DOB: Jul 07, 1946 ADMIT DATE: 05/17/2014  Procedure Date: 05/17/2014  INTERVENTIONAL CARDIOLOGIST: Leonie Man, M.D., MS  PRIMARY CARE PROVIDER: Sinclair Grooms, MD  PRIMARY CARDIOLOGIST:  Sinclair Grooms, MD  PATIENT: [name] is a 68 y.o. female with history of mild to moderate CAD who has had an MI secondary to coronary artery spasm in the past. She presented with signs and is concerning for a stable angina is referred for cardiac catheterization.  PRE-OPERATIVE DIAGNOSIS:  Unstable angina PROCEDURES PERFORMED:  Left Heart Catheterization with Native Coronary Angiography via Right Radial Artery  Left Ventriculography PROCEDURE: The patient was brought to the 2nd [facility] Lab in the fasting state and prepped and draped in the usual sterile fashion for Right Radial artery access. A modified Allen's test was performed on the right wrist demonstrating excellent collateral flow for radial access. Sterile technique was used including antiseptics, cap, gloves, gown, hand hygiene, mask and sheet. Skin prep: Chlorhexidine.  Consent: Risks of procedure as well as the alternatives and risks of each were explained to the (patient/caregiver). Consent for procedure obtained.  Time Out: Verified patient identification, verified procedure, site/side was marked, verified correct patient position, special equipment/implants available, medications/allergies/relevent history reviewed, required imaging and test results available. Performed.  Access:  Right Radial Artery: 6 Fr Sheath - Seldinger Technique (Angiocath Micropuncture Kit)  Radial Cocktail - 10 mL; IV Heparin 3500 Units  Left Heart Catheterization: 5 Fr Catheters advanced or exchanged over a Long Exchange Safety J-wire; TIG 4.0 first.  Left and Right Coronary Artery Cineangiography: TIG 4.0 Catheter  LV Hemodynamics (LV Gram): Angled Pigtail Sheath removed in the Cardiac Catheterization Lab with TR band placed for nonocclusive hemostasis.  TR Band: 1435 Hours; 13 mL air  FINDINGS:  Hemodynamics:  Central Aortic Pressure / Mean: 115/65/86 mmHg  Left Ventricular Pressure / LVEDP: 114/6/14 mmHg Left  Ventriculography:  EF: 50 - 55 %  Wall Motion: mid inferior hypokinesis  Coronary Anatomy:  Dominance: Right  Left Main: Large caliber vessel that bifurcates into the LAD and Circumflex; angiographically normal  LAD: large caliber vessel it gives off 2 small moderate caliber diagonal branches. Just after the takeoff of the second branch there is a roughly 40-50% stenosis. The vessel he continues on with minimal luminal regular is as it reaches around the apex. The vessel tapers quite dramatically but responded well to nitroglycerin.  Both diagonal branches are small moderate in caliber with the D1 being slightly larger than D2 Left Circumflex: large caliber, nondominant vessel which essentially courses is a large inferolateral OM branch. They're to proximal OM 1 and OM 2 branches that are small moderate and small in caliber respectively. The vessel then bifurcates into OM3 and OM4. OM 3 is moderate caliber with minimal luminal irregularities while to follow on branch coursing into OM 4 has a focal eccentric 30-40% stenosis.  RCA: large caliber, dominant vessel with a slightly anterior takeoff. There is a roughly 40% diffuse stenosis in the mid vessel. The vessel and biphasic distally into the Right Posterior Descending Artery (RPDA) and the Right Posterior AV Groove Branch (RPAV). Mild luminal irregularities otherwise.  RPDA: moderate-caliber vessel which reaches almost all the way to the apex. Tortuous but free of any significant disease.  RPL Sysytem:The RPAV begin is a moderate caliber vessel it gives rise to several posterior lateral branches at least 2 are significant branches but still are small diameter. Mild diffuse luminal irregularities but otherwise no significant disease.  PATIENT DISPOSITION:  The patient was transferred to the PACU holding area in a hemodynamicaly stable, chest pain free condition.  The patient tolerated the procedure well, and there were no complications. EBL: < 10 ml    The patient was stable before, during, and after the procedure. POST-OPERATIVE DIAGNOSIS:  Mild-to-moderate diffuse CAD but no culprit lesion to explain unstable angina.  Low normal LVEF with normal EP  there was response to the coronary nitroglycerin in the LAD especially. PLAN OF CARE:  Return to the nursing unit for standard post radial Care.  Would continue to optimize medical therapy.  Anticipate discharge either later on today or tomorrow depending primary team evaluation.     Discharge Medications     Medication List         ACAI PO  Take 1 capsule by mouth daily.     acetaminophen 500 MG tablet  Commonly known as:  TYLENOL  Take 1,000 mg by mouth every 6 (six) hours as needed. For pain     Aloe Vera 25 MG Caps  Take 1 capsule by mouth daily.     amLODipine 10 MG tablet  Commonly known as:  NORVASC  Take 10 mg by mouth daily.     aspirin 81 MG EC tablet  Take 1 tablet (81 mg total) by mouth daily.     atorvastatin 20 MG tablet  Commonly known as:  LIPITOR  Take 20 mg by mouth daily.     Bee Pollen 550 MG Caps  Take 1 capsule by mouth daily.     cholecalciferol 400 UNITS Tabs tablet  Commonly known as:  VITAMIN D  Take 400 Units by mouth daily.     ezetimibe 10 MG tablet  Commonly known as:  ZETIA  Take 10 mg by mouth daily.     HYDROcodone-acetaminophen 7.5-750 MG per tablet  Commonly known as:  VICODIN ES  Take 1 tablet by mouth every 6 (six) hours as needed.     isosorbide mononitrate 60 MG 24 hr tablet  Commonly known as:  IMDUR  Take 120 mg by mouth daily.     metoprolol tartrate 25 MG tablet  Commonly known as:  LOPRESSOR  Take 0.5 tablets (12.5 mg total) by mouth 2 (two) times daily.     nitroGLYCERIN 0.4 MG SL tablet  Commonly known as:  NITROSTAT  Place 1 tablet (0.4 mg total) under the tongue every 5 (five) minutes x 3 doses as needed for chest pain.     oxybutynin 5 MG 24 hr tablet  Commonly known as:  DITROPAN-XL  Take 5 mg by  mouth daily.     potassium chloride 10 MEQ CR capsule  Commonly known as:  MICRO-K  Take 10 mEq by mouth 2 (two) times daily.     traMADol 50 MG tablet  Commonly known as:  ULTRAM  Take 50 mg by mouth 2 (two) times daily as needed. Maximum dose= 8 tablets per day for pain     venlafaxine XR 150 MG 24 hr capsule  Commonly known as:  EFFEXOR-XR  Take 150 mg by mouth at bedtime.     VITAMIN B  12 PO  Take 1 capsule by mouth daily.     vitamin C 500 MG tablet  Commonly known as:  ASCORBIC ACID  Take 500 mg by mouth daily.     vitamin E 1000 UNIT capsule  Generic drug:  vitamin E  Take 1,000 Units by mouth daily.        Disposition   The patient will be discharged in stable condition to home. Discharge Instructions   Diet - low sodium heart healthy    Complete by:  As directed      Discharge instructions    Complete by:  As directed   No lifting with right arm for two days     Increase activity slowly    Complete by:  As directed           Follow-up Information   Follow up with Melina Copa, PA-C On 06/01/2014. (@ 2:15 )    Specialty:  Cardiology   Contact information:   [address] [place] [address] [phone]         Duration of Discharge Encounter: Greater than 30 minutes including physician and PA time.  Signed, Raunel Dimartino PA-C 05/18/2014, 10:31 AM

## 2014-05-17 NOTE — ED Notes (Signed)
Cardiology at bedside.

## 2014-05-17 NOTE — H&P (Signed)
Cardiologist: Lara Palinkas is an 68 y.o. female.   Chief Complaint:  UTI/ Angina HPI:   The patient is a 68 year old female with history of coronary artery disease, trigeminy/bigeminy, hypercholesterolemia, hypertension, GERD.  She had MI in 2003 which according to a 2011 was secondary to secondary to coronary artery spasm.  Cardiac cath in 2007 which showed an underlying 20% nonflow limiting coronary disease.  The patient gets pain medicine for headache but did not she follows up with a primary care provider regularly.  She presented initially for bladder discomfort and dysuria. However, she reports that she's been using increasingly more nitroglycerin for chest pain which radiates to her left arm.  She describes the pain as "heaviness" and at its worst was 9/10 in intensity.  This has worsened over the last month. She also reports diaphoresis, shortness of breath lower extremity edema.  She had some hematochezia but that has resolved.   This pain occurs with or without exertion.  She has not seen Dr. Tamala Julian in several years.   The patient currently denies nausea, vomiting, fever, orthopnea, dizziness, PND, cough, congestion, abdominal pain,  melena, lower extremity edema.  No tobacco history.   Medications: Prior to Admission medications   Medication Sig Start Date End Date Taking? Authorizing Provider  ACAI PO Take 1 capsule by mouth daily.   Yes Historical Provider, MD  acetaminophen (TYLENOL) 500 MG tablet Take 1,000 mg by mouth every 6 (six) hours as needed. For pain   Yes Historical Provider, MD  Aloe Vera 25 MG CAPS Take 1 capsule by mouth daily.   Yes Historical Provider, MD  amLODipine (NORVASC) 10 MG tablet Take 10 mg by mouth daily.    Yes Historical Provider, MD  atorvastatin (LIPITOR) 20 MG tablet Take 20 mg by mouth daily.     Yes Historical Provider, MD  Bee Pollen 550 MG CAPS Take 1 capsule by mouth daily.   Yes Historical Provider, MD  cholecalciferol (VITAMIN D) 400 UNITS  TABS tablet Take 400 Units by mouth daily.   Yes Historical Provider, MD  Cyanocobalamin (VITAMIN B 12 PO) Take 1 capsule by mouth daily.   Yes Historical Provider, MD  ezetimibe (ZETIA) 10 MG tablet Take 10 mg by mouth daily.     Yes Historical Provider, MD  HYDROcodone-acetaminophen (VICODIN ES) 7.5-750 MG per tablet Take 1 tablet by mouth every 6 (six) hours as needed. 10/06/11  Yes Simbiso Ranga, MD  isosorbide mononitrate (IMDUR) 60 MG 24 hr tablet Take 120 mg by mouth daily.   Yes Historical Provider, MD  oxybutynin (DITROPAN-XL) 5 MG 24 hr tablet Take 5 mg by mouth daily.    Yes Historical Provider, MD  potassium chloride (MICRO-K) 10 MEQ CR capsule Take 10 mEq by mouth 2 (two) times daily.    Yes Historical Provider, MD  traMADol (ULTRAM) 50 MG tablet Take 50 mg by mouth 2 (two) times daily as needed. Maximum dose= 8 tablets per day for pain   Yes Historical Provider, MD  venlafaxine (EFFEXOR-XR) 150 MG 24 hr capsule Take 150 mg by mouth at bedtime.    Yes Historical Provider, MD  vitamin C (ASCORBIC ACID) 500 MG tablet Take 500 mg by mouth daily.   Yes Historical Provider, MD  vitamin E (VITAMIN E) 1000 UNIT capsule Take 1,000 Units by mouth daily.   Yes Historical Provider, MD     Past Medical History  Diagnosis Date  . Reflux   . Hypercholesteremia   .  Hypertension   . Coronary artery disease   . GERD (gastroesophageal reflux disease)   . Diverticulitis   . UTI (lower urinary tract infection)     Past Surgical History  Procedure Laterality Date  . Joint replacement    . Vascular surgery    . Coronary stent placement    . Abdominal hysterectomy      No family history on file. Social History:  reports that she has never smoked. She does not have any smokeless tobacco history on file. She reports that she does not drink alcohol or use illicit drugs.  Allergies:  Allergies  Allergen Reactions  . Sulfa Drugs Cross Reactors Shortness Of Breath and Palpitations  .  Peanut-Containing Drug Products     swelling     (Not in a hospital admission)  Results for orders placed during the hospital encounter of 05/17/14 (from the past 48 hour(s))  CBC WITH DIFFERENTIAL     Status: Abnormal   Collection Time    05/17/14  1:40 AM      Result Value Ref Range   WBC 5.9  4.0 - 10.5 K/uL   RBC 3.83 (*) 3.87 - 5.11 MIL/uL   Hemoglobin 11.9 (*) 12.0 - 15.0 g/dL   HCT 36.6  36.0 - 46.0 %   MCV 95.6  78.0 - 100.0 fL   MCH 31.1  26.0 - 34.0 pg   MCHC 32.5  30.0 - 36.0 g/dL   RDW 14.4  11.5 - 15.5 %   Platelets 242  150 - 400 K/uL   Neutrophils Relative % 41 (*) 43 - 77 %   Neutro Abs 2.4  1.7 - 7.7 K/uL   Lymphocytes Relative 45  12 - 46 %   Lymphs Abs 2.6  0.7 - 4.0 K/uL   Monocytes Relative 8  3 - 12 %   Monocytes Absolute 0.5  0.1 - 1.0 K/uL   Eosinophils Relative 6 (*) 0 - 5 %   Eosinophils Absolute 0.3  0.0 - 0.7 K/uL   Basophils Relative 0  0 - 1 %   Basophils Absolute 0.0  0.0 - 0.1 K/uL  COMPREHENSIVE METABOLIC PANEL     Status: Abnormal   Collection Time    05/17/14  1:40 AM      Result Value Ref Range   Sodium 142  137 - 147 mEq/L   Potassium 3.2 (*) 3.7 - 5.3 mEq/L   Chloride 102  96 - 112 mEq/L   CO2 26  19 - 32 mEq/L   Glucose, Bld 96  70 - 99 mg/dL   BUN 16  6 - 23 mg/dL   Creatinine, Ser 1.58 (*) 0.50 - 1.10 mg/dL   Calcium 9.2  8.4 - 10.5 mg/dL   Total Protein 7.6  6.0 - 8.3 g/dL   Albumin 4.0  3.5 - 5.2 g/dL   AST 21  0 - 37 U/L   ALT 15  0 - 35 U/L   Alkaline Phosphatase 65  39 - 117 U/L   Total Bilirubin 0.4  0.3 - 1.2 mg/dL   GFR calc non Af Amer 33 (*) >90 mL/min   GFR calc Af Amer 38 (*) >90 mL/min   Comment: (NOTE)     The eGFR has been calculated using the CKD EPI equation.     This calculation has not been validated in all clinical situations.     eGFR's persistently <90 mL/min signify possible Chronic Kidney     Disease.  Anion gap 14  5 - 15  URINALYSIS, ROUTINE W REFLEX MICROSCOPIC     Status: Abnormal    Collection Time    05/17/14  3:42 AM      Result Value Ref Range   Color, Urine AMBER (*) YELLOW   Comment: BIOCHEMICALS MAY BE AFFECTED BY COLOR   APPearance CLOUDY (*) CLEAR   Specific Gravity, Urine 1.030  1.005 - 1.030   pH 5.5  5.0 - 8.0   Glucose, UA NEGATIVE  NEGATIVE mg/dL   Hgb urine dipstick NEGATIVE  NEGATIVE   Bilirubin Urine NEGATIVE  NEGATIVE   Ketones, ur 15 (*) NEGATIVE mg/dL   Protein, ur 100 (*) NEGATIVE mg/dL   Urobilinogen, UA 1.0  0.0 - 1.0 mg/dL   Nitrite NEGATIVE  NEGATIVE   Leukocytes, UA MODERATE (*) NEGATIVE  URINE MICROSCOPIC-ADD ON     Status: None   Collection Time    05/17/14  3:42 AM      Result Value Ref Range   Squamous Epithelial / LPF RARE  RARE   WBC, UA 21-50  <3 WBC/hpf   RBC / HPF 0-2  <3 RBC/hpf   Bacteria, UA RARE  RARE  I-STAT TROPOININ, ED     Status: None   Collection Time    05/17/14  5:05 AM      Result Value Ref Range   Troponin i, poc 0.01  0.00 - 0.08 ng/mL   Comment 3            Comment: Due to the release kinetics of cTnI,     a negative result within the first hours     of the onset of symptoms does not rule out     myocardial infarction with certainty.     If myocardial infarction is still suspected,     repeat the test at appropriate intervals.   Dg Chest Port 1 View  05/17/2014   CLINICAL DATA:  Chest pain.  EXAM: PORTABLE CHEST - 1 VIEW  COMPARISON:  Chest radiograph October 19, 2012  FINDINGS: Cardiac silhouette appears mildly enlarged, mediastinal silhouette is nonsuspicious. No pleural effusions or focal consolidation. Similar mild bronchitic changes. No pneumothorax. Soft tissue planes and included osseous structures are nonsuspicious. Multiple EKG lines overlie the patient and may obscure subtle underlying pathology.  IMPRESSION: Stable cardiomegaly and mild bronchitic changes without focal consolidation.   Electronically Signed   By: Elon Alas   On: 05/17/2014 04:38    Review of Systems  All other systems  reviewed and are negative.   Blood pressure 134/70, pulse 65, temperature 98.2 F (36.8 C), temperature source Oral, resp. rate 24, height 5' 6"  (1.676 m), weight 147 lb (66.679 kg), SpO2 99.00%. Physical Exam  Nursing note and vitals reviewed. Constitutional: She is oriented to person, place, and time. She appears well-developed and well-nourished. No distress.  HENT:  Head: Normocephalic and atraumatic.  Mouth/Throat: Oropharynx is clear and moist.  Eyes: EOM are normal. Pupils are equal, round, and reactive to light. No scleral icterus.  Neck: Normal range of motion. Neck supple.  Cardiovascular: Normal rate, regular rhythm, S1 normal and S2 normal.   Pulses:      Radial pulses are 2+ on the right side, and 2+ on the left side.       Dorsalis pedis pulses are 2+ on the right side, and 2+ on the left side.  No carotid bruit   Respiratory: Effort normal and breath sounds normal.  GI: Soft. Bowel  sounds are normal. She exhibits no distension. There is no tenderness.  Musculoskeletal: She exhibits no edema.  Lymphadenopathy:    She has no cervical adenopathy.  Neurological: She is alert and oriented to person, place, and time. She exhibits normal muscle tone.  Skin: Skin is warm and dry.  Psychiatric: She has a normal mood and affect.     Assessment/Plan Principal Problem:   Unstable angina Active Problems:   HTN (hypertension)   Coronary artery disease, MI 2003 secondary to vasospasm. 20% stenosis by cath 2007.   UTI (lower urinary tract infection)  Plan:   68 year old female with a history of MI in 2003 secondary to vasospasm. Last cath was 8 years ago and showed 20% luminal irregularities. She presents with UTI and increasing use of nitroglycerin for chest heaviness which radiates to her left arm.  EKG shows sinus rhythm with a rate of 64 beats per minute. T wave inversion in V3 through 6. This is seen on EKG in 2013 as well.  Would recommend LHC today.  Will need to be  sparing with contrast.  Admit to telemetry.    HAGER, BRYAN, PA-C. 05/17/2014, 7:21 AM   I have seen and examined the patient along with Tarri Fuller, PA.  I have reviewed the chart, notes and new data.  I agree with PA's note.  Key new complaints: increasing frequency of NTG responsive chest pain Key examination changes: no arrhythmia or evidence of CHF Key new findings / data: creat 1.58 (1.19 last year), normal enzymes; abnormal ECG - LVH versus ischemia  PLAN: Coronary angiography for unstable angina after aggressive IV fluid hydration. This procedure has been fully reviewed with the patient and informed consent has been obtained.   Sanda Klein, MD, [place] [phone] 05/17/2014, 8:34 AM

## 2014-05-17 NOTE — Progress Notes (Signed)
     Patient originally going to be discharged today but decided to stay the night. AM PA please see pended D/C summary.    Thereasa ParkinKathryn Stern PA-C  [facility]

## 2014-05-17 NOTE — Discharge Instructions (Signed)

## 2014-05-17 NOTE — ED Notes (Signed)
Pt. reports bladder pressure / dysuria onset yesterday , denies fever or chills.

## 2014-05-17 NOTE — ED Notes (Signed)
Pt states that she is here because her bladder is acting up. Pt states that she also needs her heart checked because she has been needing to take more NTG than usual. States that she takes it for heaviness in the chest and neck area. Denies any chest pain at this time. Informed Dr Ranae PalmsYelverton. EKG in process.

## 2014-05-17 NOTE — Interval H&P Note (Signed)
History and Physical Interval Note:  05/17/2014 1:54 PM  [name]  has presented today for surgery, with the diagnosis of Unstable Angina.  The various methods of treatment have been discussed with the patient and family. After consideration of risks, benefits and other options for treatment, the patient has consented to  Procedure(s): LEFT HEART CATHETERIZATION WITH CORONARY ANGIOGRAM (N/A) +/- PCI as a surgical intervention .    The patient's history has been reviewed, patient examined, no change in status, stable for surgery.  I have reviewed the patient's chart and labs.  Questions were answered to the patient's satisfaction.     Tabitha Riggins W  Cath Lab Visit (complete for each Cath Lab visit)  Clinical Evaluation Leading to the Procedure:   ACS: Yes.    Non-ACS:    Anginal Classification: CCS IV  Anti-ischemic medical therapy: Maximal Therapy (2 or more classes of medications)  Non-Invasive Test Results: No non-invasive testing performed  Prior CABG: No previous CABG

## 2014-05-18 DIAGNOSIS — I10 Essential (primary) hypertension: Secondary | ICD-10-CM | POA: Diagnosis not present

## 2014-05-18 DIAGNOSIS — I2510 Atherosclerotic heart disease of native coronary artery without angina pectoris: Secondary | ICD-10-CM | POA: Diagnosis not present

## 2014-05-18 DIAGNOSIS — K219 Gastro-esophageal reflux disease without esophagitis: Secondary | ICD-10-CM | POA: Diagnosis not present

## 2014-05-18 DIAGNOSIS — I200 Unstable angina: Secondary | ICD-10-CM | POA: Diagnosis not present

## 2014-05-18 LAB — LIPID PANEL
CHOL/HDL RATIO: 2.9 ratio
CHOLESTEROL: 134 mg/dL (ref 0–200)
HDL: 46 mg/dL (ref >39)
LDL Cholesterol: 69 mg/dL (ref 0–99)
Triglycerides: 93 mg/dL (ref <150)
VLDL: 19 mg/dL (ref 0–40)

## 2014-05-18 MED ORDER — METOPROLOL TARTRATE 25 MG PO TABS: 12.5000 mg | ORAL_TABLET | Freq: Two times a day (BID) | ORAL | Status: DC

## 2014-05-18 MED ORDER — NITROGLYCERIN 0.4 MG SL SUBL: 0.4000 mg | SUBLINGUAL_TABLET | SUBLINGUAL | Status: DC | PRN

## 2014-05-18 NOTE — Progress Notes (Signed)
Some poorly controlled. She has no chest discomfort or complaints. She is ready for discharge.

## 2014-05-18 NOTE — Discharge Summary (Signed)
Pt referred for cardiac cath to evaluate potential anginal cp.  No significant CAD on cath, ? Recurrent spasm.    Was otherwise OK for d/c post cath bedrest.  Marykay LexHARDING,DAVID W, MD

## 2014-05-18 NOTE — Progress Notes (Signed)
Subjective: No CP or SOB  Objective: Vital signs in last 24 hours: Temp:  [97.7 F (36.5 C)-98.8 F (37.1 C)] 98.8 F (37.1 C) (07/24 0447) Pulse Rate:  [60-74] 74 (07/23 2349) Resp:  [13-26] 18 (07/24 0447) BP: (111-168)/(51-101) 154/97 mmHg (07/24 0447) SpO2:  [95 %-100 %] 99 % (07/24 0447) Weight:  [156 lb 12 oz (71.1 kg)] 156 lb 12 oz (71.1 kg) (07/24 0500) Last BM Date: 05/16/14  Intake/Output from previous day: 07/23 0701 - 07/24 0700 In: 892.5 [P.O.:120; I.V.:772.5] Out: 1275 [Urine:1275] Intake/Output this shift: Total I/O In: -  Out: 900 [Urine:900]  Medications Current Facility-Administered Medications  Medication Dose Route Frequency Provider Last Rate Last Dose  . 0.9 %  sodium chloride infusion  250 mL Intravenous PRN Marykay Lexavid W Harding, MD      . acetaminophen (TYLENOL) tablet 650 mg  650 mg Oral Q4H PRN Wilburt FinlayBryan Caren Garske, PA-C   650 mg at 05/18/14 0452  . amLODipine (NORVASC) tablet 10 mg  10 mg Oral Daily Wilburt FinlayBryan Michiko Lineman, PA-C   10 mg at 05/17/14 2012  . aspirin EC tablet 81 mg  81 mg Oral Daily Wilburt FinlayBryan Alyanah Elliott, PA-C      . atorvastatin (LIPITOR) tablet 20 mg  20 mg Oral Daily Wilburt FinlayBryan Aarthi Uyeno, PA-C   20 mg at 05/17/14 2013  . ezetimibe (ZETIA) tablet 10 mg  10 mg Oral Daily Wilburt FinlayBryan Selda Jalbert, PA-C   10 mg at 05/17/14 2011  . isosorbide mononitrate (IMDUR) 24 hr tablet 120 mg  120 mg Oral Daily Wilburt FinlayBryan Benjamine Strout, PA-C   120 mg at 05/17/14 2011  . metoprolol tartrate (LOPRESSOR) tablet 12.5 mg  12.5 mg Oral BID Wilburt FinlayBryan Gwenneth Whiteman, PA-C   12.5 mg at 05/17/14 2349  . nitroGLYCERIN (NITROSTAT) SL tablet 0.4 mg  0.4 mg Sublingual Q5 Min x 3 PRN Wilburt FinlayBryan Kealie Barrie, PA-C      . ondansetron [facility](ZOFRAN) injection 4 mg  4 mg Intravenous Q6H PRN Wilburt FinlayBryan Antonino Nienhuis, PA-C      . oxybutynin (DITROPAN-XL) 24 hr tablet 5 mg  5 mg Oral Daily Wilburt FinlayBryan Inigo Lantigua, PA-C   5 mg at 05/17/14 2046  . pantoprazole (PROTONIX) EC tablet 40 mg  40 mg Oral Daily Leeann MustJacob Kelly, MD   40 mg at 05/17/14 2046  . pneumococcal 23 valent vaccine (PNU-IMMUNE)  injection 0.5 mL  0.5 mL Intramuscular Tomorrow-1000 Lyn RecordsHenry W Smith III, MD      . sodium chloride 0.9 % injection 3 mL  3 mL Intravenous Q12H Marykay Lexavid W Harding, MD      . sodium chloride 0.9 % injection 3 mL  3 mL Intravenous PRN Marykay Lexavid W Harding, MD      . traMADol Janean Sark(ULTRAM) tablet 50 mg  50 mg Oral BID PRN Wilburt FinlayBryan Laster Appling, PA-C   50 mg at 05/18/14 0452  . venlafaxine XR (EFFEXOR-XR) 24 hr capsule 150 mg  150 mg Oral QHS Wilburt FinlayBryan Lynx Goodrich, PA-C   150 mg at 05/17/14 2012    PE: General appearance: alert, cooperative and no distress Lungs: clear to auscultation bilaterally Heart: regular rate and rhythm, S1, S2 normal, no murmur, click, rub or gallop Extremities: No LEE Pulses: 2+ and symmetric Skin: Warm and dry Neurologic: Grossly normal  Lab Results:   Recent Labs  05/17/14 0140  WBC 5.9  HGB 11.9*  HCT 36.6  PLT 242   BMET  Recent Labs  05/17/14 0140 05/17/14 1130  NA 142 143  K 3.2* 3.4*  CL 102 105  CO2 26 26  GLUCOSE 96 83  BUN 16 14  CREATININE 1.58* 0.97  CALCIUM 9.2 9.1   PT/INR  Recent Labs  05/17/14 1130  LABPROT 14.7  INR 1.15   Cholesterol  Recent Labs  05/18/14 0300  CHOL 134   Lipid Panel     Component Value Date/Time   CHOL 134 05/18/2014 0300   TRIG 93 05/18/2014 0300   HDL 46 05/18/2014 0300   CHOLHDL 2.9 05/18/2014 0300   VLDL 19 05/18/2014 0300   LDLCALC 69 05/18/2014 0300      Assessment/Plan  Principal Problem:   Unstable angina Active Problems:   Diverticulitis   HTN (hypertension)   GERD (gastroesophageal reflux disease)   UTI (lower urinary tract infection)   CAD (coronary artery disease)   History of DVT (deep vein thrombosis)   History of pulmonary embolism   Seizures   Arthritis   Fibromyalgia   Hypercholesteremia  Plan:   SP LHC revealing mild-to-moderate diffuse CAD but no culprit lesion to explain unstable angina. Low normal LVEF with normal EP there was response to the coronary nitroglycerin in the LAD especially.  Continue amlodipine.  She is already getting 120mg  of Imdur.  Lipids look good. On statin.  Potassium given yesterday.   UA showed leukocytes with no bacteria.  Ceftriaxone given in ER.  Follow up with PCP as needed.     LOS: 1 day    Benzion Mesta PA-C 05/18/2014 6:14 AM

## 2014-06-01 ENCOUNTER — Encounter: Payer: Self-pay | Admitting: Physician Assistant

## 2014-06-01 ENCOUNTER — Ambulatory Visit (INDEPENDENT_AMBULATORY_CARE_PROVIDER_SITE_OTHER): Payer: Medicare Other | Admitting: Physician Assistant

## 2014-06-01 VITALS — BP 118/68 | HR 65 | Resp 16 | Ht 66.0 in | Wt 148.8 lb

## 2014-06-01 DIAGNOSIS — I10 Essential (primary) hypertension: Secondary | ICD-10-CM

## 2014-06-01 DIAGNOSIS — K921 Melena: Secondary | ICD-10-CM

## 2014-06-01 DIAGNOSIS — R1314 Dysphagia, pharyngoesophageal phase: Secondary | ICD-10-CM

## 2014-06-01 DIAGNOSIS — R1310 Dysphagia, unspecified: Secondary | ICD-10-CM

## 2014-06-01 DIAGNOSIS — E876 Hypokalemia: Secondary | ICD-10-CM

## 2014-06-01 DIAGNOSIS — I2510 Atherosclerotic heart disease of native coronary artery without angina pectoris: Secondary | ICD-10-CM

## 2014-06-01 DIAGNOSIS — E785 Hyperlipidemia, unspecified: Secondary | ICD-10-CM

## 2014-06-01 DIAGNOSIS — I25119 Atherosclerotic heart disease of native coronary artery with unspecified angina pectoris: Secondary | ICD-10-CM | POA: Insufficient documentation

## 2014-06-01 DIAGNOSIS — R1319 Other dysphagia: Secondary | ICD-10-CM

## 2014-06-01 NOTE — Patient Instructions (Addendum)
Your physician recommends that you continue on your current medications as directed. Please refer to the Current Medication list given to you today.  Your physician recommends that you have lab work today:  mag/bmet   You have been referred to [facility] at the [address]. DX: esophageal dysphagia/hematochzia   Pt is looking for PCP please advise.  Your physician recommends that you schedule a follow-up appointment in: 6-8 weeks with Dr. Katrinka BlazingSmith.

## 2014-06-01 NOTE — Progress Notes (Addendum)
[address] [place], [place]  [address] Phone: [phone] Fax:  [phone]  Date:  06/01/2014   Patient ID:  [name], DOB 07/27/1946, MRN [id number]   PCP:  None  Cardiologist:  Dr. Katrinka Blazing   History of Present Illness: Viola Kinnick is a 68 y.o. female with history of nonobstructive CAD and MI in 2003 felt due to coronary spasm, trigeminy/bigeminy, HLD, HTN, and GERD who presents for hospital followup. She was recently admitted with chest discomfort responsive to NTG. Troponins remained negative. She was not tachycardic, tachypnic or hypoxic and no signs of DVT documented. She underwent cardiac cath showing: - Mild-to-moderate diffuse CAD but no culprit lesion to explain unstable angina. -  Low normal LVEF with normal EDP - there was response to the coronary nitroglycerin in the LAD especially (tapers dramatically) She is on amlodipine 10mg  daily and Imdur 120mg  and reports compliance with meds. Overall she is feeling much better. She has only had very infrequent episodes of chest discomfort described like a choking sensation up near her throat that responds to NTG. No exertional angina or exertional dyspnea. She also reports sensation that some foods (and particularly non-gel-coated medications) are getting stuck mid-way when swallowing. She is usually able to get them down after several minutes, and drinking water helps.  Recent Labs: 05/17/2014: ALT 15; Creatinine 0.97; Hemoglobin 11.9*; Potassium 3.4*; TSH 0.654  05/18/2014: HDL Cholesterol by NMR 46; LDL (calc) 69   Wt Readings from Last 3 Encounters:  06/01/14 148 lb 12.8 oz (67.495 kg)  05/18/14 156 lb 12 oz (71.1 kg)  05/18/14 156 lb 12 oz (71.1 kg)     Past Medical History  Diagnosis Date  . Hypercholesteremia   . Hypertension   . Coronary artery disease     a. 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 non obs dz LHC 2007. c. Non obs dz by [facility] 05/17/14.   Marland Kitchen GERD (gastroesophageal reflux disease)   . Diverticulitis     a.  Hx microperf 2012 - hospitalizated.  Marland Kitchen UTI (lower urinary tract infection)   . History of pulmonary embolism ?1990's  . History of DVT (deep vein thrombosis) ~ 2005  . Cluster headache   . Seizures   . Arthritis   . Fibromyalgia   . Chronic lower back pain   . PVC's (premature ventricular contractions)     a. Hx of trigeminy/bigeminy.    Current Outpatient Prescriptions  Medication Sig Dispense Refill  . ACAI PO Take 1 capsule by mouth daily.      Marland Kitchen acetaminophen (TYLENOL) 500 MG tablet Take 1,000 mg by mouth every 6 (six) hours as needed. For pain      . Aloe Vera 25 MG CAPS Take 1 capsule by mouth daily.      Marland Kitchen amLODipine (NORVASC) 10 MG tablet Take 10 mg by mouth daily.       Marland Kitchen aspirin EC 81 MG EC tablet Take 1 tablet (81 mg total) by mouth daily.      Marland Kitchen atorvastatin (LIPITOR) 20 MG tablet Take 20 mg by mouth daily.        Alphonsus Sias Pollen 550 MG CAPS Take 1 capsule by mouth daily.      . cholecalciferol (VITAMIN D) 400 UNITS TABS tablet Take 400 Units by mouth daily.      . Cyanocobalamin (VITAMIN B 12 PO) Take 1 capsule by mouth daily.      Marland Kitchen ezetimibe (ZETIA) 10 MG tablet Take 10 mg by  mouth daily.        Marland Kitchen HYDROcodone-acetaminophen (VICODIN ES) 7.5-750 MG per tablet Take 1 tablet by mouth every 6 (six) hours as needed.  30 tablet  0  . isosorbide mononitrate (IMDUR) 60 MG 24 hr tablet Take 120 mg by mouth daily.      . metoprolol tartrate (LOPRESSOR) 25 MG tablet Take 0.5 tablets (12.5 mg total) by mouth 2 (two) times daily.  60 tablet  5  . NEXIUM 40 MG capsule Take 80 mg by mouth daily.       . nitroGLYCERIN (NITROSTAT) 0.4 MG SL tablet Place 1 tablet (0.4 mg total) under the tongue every 5 (five) minutes x 3 doses as needed for chest pain.  25 tablet  12  . oxybutynin (DITROPAN-XL) 5 MG 24 hr tablet Take 5 mg by mouth daily.       . potassium chloride (MICRO-K) 10 MEQ CR capsule Take 10 mEq by mouth 2 (two) times daily.       . traMADol (ULTRAM) 50 MG tablet Take 50 mg by mouth  2 (two) times daily as needed. Maximum dose= 8 tablets per day for pain      . venlafaxine (EFFEXOR-XR) 150 MG 24 hr capsule Take 150 mg by mouth at bedtime.       . vitamin C (ASCORBIC ACID) 500 MG tablet Take 500 mg by mouth daily.      . vitamin E (VITAMIN E) 1000 UNIT capsule Take 1,000 Units by mouth daily.       No current facility-administered medications for this visit.    Allergies:   Sulfa drugs cross reactors and Peanut-containing drug products   Social History:  The patient  reports that she has never smoked. She has never used smokeless tobacco. She reports that she does not drink alcohol or use illicit drugs.   Family History:  The patient's family history includes CAD in her brother and father.   ROS:  Please see the history of present illness.   Intermittent hematochezia (only spotty, very infrequent) since age 2.  All other systems reviewed and negative.   PHYSICAL EXAM:  VS:  BP 118/68  Pulse 65  Resp 16  Ht 5\' 6"  (1.676 m)  Wt 148 lb 12.8 oz (67.495 kg)  BMI 24.03 kg/m2 Well nourished, well developed thin AAF in no acute distress HEENT: normal Neck: no JVD Cardiac:  normal S1, S2; RRR; no murmur Lungs:  clear to auscultation bilaterally, no wheezing, rhonchi or rales Abd: soft, nontender, no hepatomegaly Ext: no edema, R radial cath site well healed without ecchymosis or bruising. Good pulse Skin: warm and dry Neuro:  moves all extremities spontaneously, no focal abnormalities noted  EKG:  NSR 65bpm with short PR interval, nonspecific diffuse T wave changes similar to previous  ASSESSMENT AND PLAN:  1. CAD with history of coronary spasm - she is feeling better. Continue current regimen including aspirin, beta blocker, statin, amlodipine and Imdur. EKG is stable with chronically short PR. No evidence of recent arrhythmias. 2. Suspected esophageal dysmotility versus stricture - I wonder if there is a component of esophageal spasm as well given that symptoms are  intermittently responsive to nitroglycerin. Coronary status is stable. Will refer to GI for further evaluation. 3. H/o hematochezia, due for colonoscopy - GI as above. 4. HTN, controlled - continue current regimen.  5. Hyperlipidemia - continue statin. 6. Hypokalemia - her K was continually low in the hospital. Will recheck today along with Mg to  determine if there was been resolution. She may take her potassium supps in applesauce. If this is troublesome for her, we talked about KCl elixir as an alterntive.  Dispo: F/u Dr. Katrinka BlazingSmith in 6-8 weeks. She is also asking for a PCP referral to establish care and our checkout team can assist with this.  Signed, Ronie Spiesayna Kristoffer Bala, PA-C  06/01/2014 4:40 PM

## 2014-06-02 LAB — BASIC METABOLIC PANEL
BUN: 17 mg/dL (ref 6–23)
CALCIUM: 9.4 mg/dL (ref 8.4–10.5)
CO2: 27 mEq/L (ref 19–32)
CREATININE: 1.21 mg/dL — AB (ref 0.50–1.10)
Chloride: 105 mEq/L (ref 96–112)
GLUCOSE: 88 mg/dL (ref 70–99)
Potassium: 3.8 mEq/L (ref 3.5–5.3)
Sodium: 139 mEq/L (ref 135–145)

## 2014-06-02 LAB — MAGNESIUM: Magnesium: 2 mg/dL (ref 1.5–2.5)

## 2014-06-15 ENCOUNTER — Encounter: Payer: Self-pay | Admitting: *Deleted

## 2014-08-02 ENCOUNTER — Ambulatory Visit: Payer: Medicare Other | Admitting: Interventional Cardiology

## 2014-08-06 ENCOUNTER — Encounter: Payer: Self-pay | Admitting: Gastroenterology

## 2014-08-06 ENCOUNTER — Encounter: Payer: Self-pay | Admitting: Interventional Cardiology

## 2014-09-13 ENCOUNTER — Encounter: Payer: Self-pay | Admitting: Interventional Cardiology

## 2014-09-19 ENCOUNTER — Encounter: Payer: Self-pay | Admitting: Family Medicine

## 2014-09-19 ENCOUNTER — Ambulatory Visit: Payer: Medicare Other | Admitting: Family Medicine

## 2014-09-19 ENCOUNTER — Ambulatory Visit (INDEPENDENT_AMBULATORY_CARE_PROVIDER_SITE_OTHER): Payer: Medicare Other | Admitting: Family Medicine

## 2014-09-19 DIAGNOSIS — R739 Hyperglycemia, unspecified: Secondary | ICD-10-CM | POA: Insufficient documentation

## 2014-09-19 DIAGNOSIS — E119 Type 2 diabetes mellitus without complications: Secondary | ICD-10-CM | POA: Insufficient documentation

## 2014-09-19 DIAGNOSIS — N3281 Overactive bladder: Secondary | ICD-10-CM

## 2014-09-19 DIAGNOSIS — I2510 Atherosclerotic heart disease of native coronary artery without angina pectoris: Secondary | ICD-10-CM

## 2014-09-19 DIAGNOSIS — I10 Essential (primary) hypertension: Secondary | ICD-10-CM

## 2014-09-19 DIAGNOSIS — G43109 Migraine with aura, not intractable, without status migrainosus: Secondary | ICD-10-CM

## 2014-09-19 DIAGNOSIS — E7800 Pure hypercholesterolemia, unspecified: Secondary | ICD-10-CM

## 2014-09-19 DIAGNOSIS — G43909 Migraine, unspecified, not intractable, without status migrainosus: Secondary | ICD-10-CM | POA: Insufficient documentation

## 2014-09-19 HISTORY — DX: Overactive bladder: N32.81

## 2014-09-19 NOTE — Assessment & Plan Note (Signed)
Well-controlled on atorvastatin 20 mg. Continue current medication.

## 2014-09-19 NOTE — Progress Notes (Signed)
Katherine ConchStephen Hunter, MD Phone: [phone]  Subjective:  Patient presents today to establish care. Last physician was Dr. Ludwig ClarksMoreira. Chief complaint-noted.   Coronary artery disease-asymptomatic, stable Patient has a history of incidentally in 2003 due to vasospasm. She has had nonobstructing artery disease most recently noted on catheterization in 05/17/2014. She has been stable on aspirin, statin, Zetia, Imdur. She does not have to use nitroglycerin at the current time. ROS-no chest pain or shortness of breath, no dyspnea on exertion  Headache history-controlled Patient states she has a history of migraines, cluster, tension headaches. She is followed by headache clinic. She states she had a seizure with her headache in the past. He told this was related to childhood fall. Despite this she says that she's been placed on tramadol by her headache clinic.  ROS-no blurred vision, no extremity weakness  Hyperlipidemia-good control Lab Results  Component Value Date   LDLCALC 69 05/18/2014  On statin: Atorvastatin 20 mg ROS- no chest pain or shortness of breath. No myalgias  Hypertension-well-controlled BP Readings from Last 3 Encounters:  09/19/14 112/64  06/01/14 118/68  05/18/14 154/97  Home BP monitoring-no Compliant with medications-yes without side effects ROS-Denies any CP,  SOB, blurry vision, LE edema.   The following were reviewed and entered/updated in epic: Past Medical History  Diagnosis Date  . Hypercholesteremia   . Hypertension   . Coronary artery disease     a. 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 non obs dz LHC 2007. c. Non obs dz (mild-mod) by [facility] 05/17/14.   Marland Kitchen. GERD (gastroesophageal reflux disease)   . Diverticulitis     a. Hx microperf 2012 - hospitalizated.  Marland Kitchen. UTI (lower urinary tract infection)   . Cluster headache   . Seizures   . Arthritis   . Chronic lower back pain   . PVC's (premature ventricular contractions)     a. Hx of trigeminy/bigeminy.   Patient Active  Problem List   Diagnosis Date Noted  . Migraine 09/19/2014    Priority: High  . Coronary artery disease     Priority: High  . Seizures     Priority: High  . Hyperglycemia 09/19/2014    Priority: Medium  . Hypercholesteremia     Priority: Medium  . HTN (hypertension) 10/01/2011    Priority: Medium  . Overactive bladder 09/19/2014    Priority: Low  . Hypokalemia 06/01/2014    Priority: Low  . Arthritis     Priority: Low  . GERD (gastroesophageal reflux disease) 10/04/2011    Priority: Low   Past Surgical History  Procedure Laterality Date  . Joint replacement      right knee  . Vascular surgery    . Coronary angioplasty with stent placement  1995    "1"  . Cardiac catheterization  05/17/2014  . Total knee arthroplasty Right 2005  . Abdominal hysterectomy  1987    "partial"-still has ovaries  . Tubal ligation  1970    Family History  Problem Relation Age of Onset  . CAD Brother   . CAD Father   . Diabetes Mother     father, sister, brothers    Medications- reviewed and updated Current Outpatient Prescriptions  Medication Sig Dispense Refill  . ACAI PO Take 1 capsule by mouth daily.    . Aloe Vera 25 MG CAPS Take 1 capsule by mouth daily.    Marland Kitchen. amLODipine (NORVASC) 10 MG tablet Take 10 mg by mouth daily.     Marland Kitchen. aspirin EC 81 MG EC  tablet Take 1 tablet (81 mg total) by mouth daily.    Marland Kitchen. atorvastatin (LIPITOR) 20 MG tablet Take 20 mg by mouth daily.      Alphonsus Sias. Bee Pollen 550 MG CAPS Take 1 capsule by mouth daily.    . cholecalciferol (VITAMIN D) 400 UNITS TABS tablet Take 400 Units by mouth daily.    . Cyanocobalamin (VITAMIN B 12 PO) Take 1 capsule by mouth daily.    Marland Kitchen. ezetimibe (ZETIA) 10 MG tablet Take 10 mg by mouth daily.      . isosorbide mononitrate (IMDUR) 60 MG 24 hr tablet Take 120 mg by mouth daily.    . metoprolol tartrate (LOPRESSOR) 25 MG tablet Take 0.5 tablets (12.5 mg total) by mouth 2 (two) times daily. 60 tablet 5  . NEXIUM 40 MG capsule Take 80 mg by  mouth daily.     Marland Kitchen. oxybutynin (DITROPAN-XL) 5 MG 24 hr tablet Take 5 mg by mouth daily.     . potassium chloride (MICRO-K) 10 MEQ CR capsule Take 10 mEq by mouth 2 (two) times daily.     . traMADol (ULTRAM) 50 MG tablet Take 50 mg by mouth 2 (two) times daily as needed. Maximum dose= 8 tablets per day for pain    . venlafaxine (EFFEXOR-XR) 150 MG 24 hr capsule Take 150 mg by mouth at bedtime.     . vitamin C (ASCORBIC ACID) 500 MG tablet Take 500 mg by mouth daily.    . vitamin E (VITAMIN E) 1000 UNIT capsule Take 1,000 Units by mouth daily.    Marland Kitchen. acetaminophen (TYLENOL) 500 MG tablet Take 1,000 mg by mouth every 6 (six) hours as needed. For pain    . HYDROcodone-acetaminophen (VICODIN ES) 7.5-750 MG per tablet Take 1 tablet by mouth every 6 (six) hours as needed. (Patient not taking: Reported on 09/19/2014) 30 tablet 0  . nitroGLYCERIN (NITROSTAT) 0.4 MG SL tablet Place 1 tablet (0.4 mg total) under the tongue every 5 (five) minutes x 3 doses as needed for chest pain. (Patient not taking: Reported on 09/19/2014) 25 tablet 12   No current facility-administered medications for this visit.    Allergies-reviewed and updated Allergies  Allergen Reactions  . Sulfa Drugs Cross Reactors Shortness Of Breath and Palpitations  . Peanut-Containing Drug Products     swelling    History   Social History  . Marital Status: Married    Spouse Name: N/A    Number of Children: N/A  . Years of Education: N/A   Social History Main Topics  . Smoking status: Never Smoker   . Smokeless tobacco: Never Used  . Alcohol Use: No  . Drug Use: No  . Sexual Activity: No   Other Topics Concern  . None   Social History Narrative   Separated. 4 children from first marriage. 16 grandkids.       Retired from [employer] for [employer] years, went to [employer] for 5 years. Retired 2003 after MI      Hobbies: babysit/active with children    ROS--See HPI , otherwise full ROS was completed and negative except as noted  above  Objective: BP 112/64 mmHg  Temp(Src) 98.6 F (37 C)  Ht 5\' 6"  (1.676 m)  Wt 147 lb (66.679 kg)  BMI 23.74 kg/m2 Gen: NAD, resting comfortably in chair, energetic HEENT: Mucous membranes are moist. Oropharynx normal. TM normal but thin canals Sclera normal. Lids and iris normal. PERRLA Neck: no thyromegaly, no lymphadenopathy CV: RRR no murmurs  rubs or gallops Lungs: CTAB no crackles, wheeze, rhonchi Abdomen: soft/nontender/nondistended/normal bowel sounds. No rebound or guarding.  Ext: no edema, 2+ pulses PT Skin: warm, dry, no rash Neuro: 5/5 strength upper and lower extremities, normal gait and coordination   Assessment/Plan:  Migraine Atypical history with migraines, cluster headaches, tension headaches all reported. She states she is on venlafaxine for prevention. She also takes tramadol and Vicodin as needed. I told patient I'm concerned about the tramadol given her reported seizure history. We will obtain records and review.  Hypercholesteremia Well-controlled on atorvastatin 20 mg. Continue current medication.  HTN (hypertension) Well-controlled on amlodipine 10 mg, metoprolol 12.5 mg twice a day,  Imdur 120 mg daily. Continue current medications.  Coronary artery disease Asymptomatic. Stable. Nonobstructive. Continue aspirin, statin, Zetia, Imdur.   Return precautions advised. 4 month follow-up planned.

## 2014-09-19 NOTE — Patient Instructions (Addendum)
Give patient a mammogram sheet.   Requests records for last 5 years from Dr. Jamal Col[facility]ira [address] At front desk. Request records from your headache doctor.   Call keba with the headache medicine you take-verify that it is venlafaxine  Flu shot after thanksgiving - call for appointment in our flu clinic.

## 2014-09-19 NOTE — Assessment & Plan Note (Signed)
Asymptomatic. Stable. Nonobstructive. Continue aspirin, statin, Zetia, Imdur.

## 2014-09-19 NOTE — Assessment & Plan Note (Signed)
Well-controlled on amlodipine 10 mg, metoprolol 12.5 mg twice a day,  Imdur 120 mg daily. Continue current medications.

## 2014-09-19 NOTE — Assessment & Plan Note (Signed)
Atypical history with migraines, cluster headaches, tension headaches all reported. She states she is on venlafaxine for prevention. She also takes tramadol and Vicodin as needed. I told patient I'm concerned about the tramadol given her reported seizure history. We will obtain records and review.

## 2014-09-26 ENCOUNTER — Telehealth: Payer: Self-pay | Admitting: *Deleted

## 2014-09-26 NOTE — Telephone Encounter (Signed)
New Message  Pt has not received vacc but plans on taking it at the drug store.

## 2014-10-04 ENCOUNTER — Ambulatory Visit: Payer: Medicare Other | Admitting: Gastroenterology

## 2014-10-04 ENCOUNTER — Encounter (HOSPITAL_COMMUNITY): Payer: Self-pay | Admitting: Cardiology

## 2015-01-31 ENCOUNTER — Encounter (HOSPITAL_COMMUNITY): Payer: Self-pay | Admitting: Emergency Medicine

## 2015-01-31 ENCOUNTER — Emergency Department (HOSPITAL_COMMUNITY)
Admission: EM | Admit: 2015-01-31 | Discharge: 2015-01-31 | Disposition: A | Discharge status: Home / Self Care | Payer: Medicare Other | Attending: Emergency Medicine | Admitting: Emergency Medicine

## 2015-01-31 ENCOUNTER — Emergency Department (HOSPITAL_COMMUNITY): Payer: Medicare Other

## 2015-01-31 DIAGNOSIS — I25110 Atherosclerotic heart disease of native coronary artery with unstable angina pectoris: Secondary | ICD-10-CM | POA: Diagnosis not present

## 2015-01-31 DIAGNOSIS — Z9889 Other specified postprocedural states: Secondary | ICD-10-CM | POA: Insufficient documentation

## 2015-01-31 DIAGNOSIS — E785 Hyperlipidemia, unspecified: Secondary | ICD-10-CM | POA: Diagnosis not present

## 2015-01-31 DIAGNOSIS — M1990 Unspecified osteoarthritis, unspecified site: Secondary | ICD-10-CM | POA: Diagnosis not present

## 2015-01-31 DIAGNOSIS — K219 Gastro-esophageal reflux disease without esophagitis: Secondary | ICD-10-CM | POA: Diagnosis not present

## 2015-01-31 DIAGNOSIS — E780 Pure hypercholesterolemia: Secondary | ICD-10-CM | POA: Insufficient documentation

## 2015-01-31 DIAGNOSIS — Z7982 Long term (current) use of aspirin: Secondary | ICD-10-CM | POA: Insufficient documentation

## 2015-01-31 DIAGNOSIS — Z87440 Personal history of urinary (tract) infections: Secondary | ICD-10-CM | POA: Insufficient documentation

## 2015-01-31 DIAGNOSIS — I10 Essential (primary) hypertension: Secondary | ICD-10-CM | POA: Diagnosis not present

## 2015-01-31 DIAGNOSIS — Z8669 Personal history of other diseases of the nervous system and sense organs: Secondary | ICD-10-CM | POA: Diagnosis not present

## 2015-01-31 DIAGNOSIS — G8929 Other chronic pain: Secondary | ICD-10-CM | POA: Diagnosis not present

## 2015-01-31 DIAGNOSIS — R0789 Other chest pain: Secondary | ICD-10-CM

## 2015-01-31 DIAGNOSIS — Z9861 Coronary angioplasty status: Secondary | ICD-10-CM | POA: Insufficient documentation

## 2015-01-31 DIAGNOSIS — R079 Chest pain, unspecified: Secondary | ICD-10-CM | POA: Diagnosis present

## 2015-01-31 DIAGNOSIS — Z79899 Other long term (current) drug therapy: Secondary | ICD-10-CM | POA: Insufficient documentation

## 2015-01-31 DIAGNOSIS — R251 Tremor, unspecified: Secondary | ICD-10-CM | POA: Diagnosis not present

## 2015-01-31 DIAGNOSIS — I201 Angina pectoris with documented spasm: Secondary | ICD-10-CM

## 2015-01-31 DIAGNOSIS — I200 Unstable angina: Secondary | ICD-10-CM

## 2015-01-31 LAB — COMPREHENSIVE METABOLIC PANEL
ALT: 23 U/L (ref 0–35)
AST: 27 U/L (ref 0–37)
Albumin: 4.3 g/dL (ref 3.5–5.2)
Alkaline Phosphatase: 65 U/L (ref 39–117)
Anion gap: 11 (ref 5–15)
BUN: 10 mg/dL (ref 6–23)
CO2: 25 mmol/L (ref 19–32)
Calcium: 9.7 mg/dL (ref 8.4–10.5)
Chloride: 104 mmol/L (ref 96–112)
Creatinine, Ser: 0.89 mg/dL (ref 0.50–1.10)
GFR calc Af Amer: 75 mL/min — ABNORMAL LOW (ref >90)
GFR, EST NON AFRICAN AMERICAN: 65 mL/min — AB (ref >90)
GLUCOSE: 103 mg/dL — AB (ref 70–99)
POTASSIUM: 3.4 mmol/L — AB (ref 3.5–5.1)
SODIUM: 140 mmol/L (ref 135–145)
TOTAL PROTEIN: 7.8 g/dL (ref 6.0–8.3)
Total Bilirubin: 1.4 mg/dL — ABNORMAL HIGH (ref 0.3–1.2)

## 2015-01-31 LAB — CBC WITH DIFFERENTIAL/PLATELET
BASOS PCT: 1 % (ref 0–1)
Basophils Absolute: 0 10*3/uL (ref 0.0–0.1)
EOS ABS: 0.2 10*3/uL (ref 0.0–0.7)
Eosinophils Relative: 5 % (ref 0–5)
HEMATOCRIT: 39.7 % (ref 36.0–46.0)
HEMOGLOBIN: 13.3 g/dL (ref 12.0–15.0)
LYMPHS ABS: 1.6 10*3/uL (ref 0.7–4.0)
LYMPHS PCT: 41 % (ref 12–46)
MCH: 31.8 pg (ref 26.0–34.0)
MCHC: 33.5 g/dL (ref 30.0–36.0)
MCV: 95 fL (ref 78.0–100.0)
Monocytes Absolute: 0.4 10*3/uL (ref 0.1–1.0)
Monocytes Relative: 11 % (ref 3–12)
Neutro Abs: 1.6 10*3/uL — ABNORMAL LOW (ref 1.7–7.7)
Neutrophils Relative %: 42 % — ABNORMAL LOW (ref 43–77)
Platelets: 220 10*3/uL (ref 150–400)
RBC: 4.18 MIL/uL (ref 3.87–5.11)
RDW: 14 % (ref 11.5–15.5)
WBC: 3.8 10*3/uL — ABNORMAL LOW (ref 4.0–10.5)

## 2015-01-31 LAB — I-STAT TROPONIN, ED
Troponin i, poc: 0.01 ng/mL (ref 0.00–0.08)
Troponin i, poc: 0 ng/mL (ref 0.00–0.08)

## 2015-01-31 MED ORDER — HEPARIN SODIUM (PORCINE) 5000 UNIT/ML IJ SOLN
4000.0000 [IU] | Freq: Once | INTRAMUSCULAR | Status: AC
  Administered 2015-01-31: 4000 [IU] via INTRAVENOUS
  Filled 2015-01-31: qty 1

## 2015-01-31 MED ORDER — CARVEDILOL 6.25 MG PO TABS: 6.2500 mg | ORAL_TABLET | Freq: Two times a day (BID) | ORAL | Status: DC

## 2015-01-31 MED ORDER — NITROGLYCERIN 0.4 MG SL SUBL: 0.4000 mg | SUBLINGUAL_TABLET | SUBLINGUAL | Status: DC | PRN

## 2015-01-31 MED ORDER — NITROGLYCERIN IN D5W 200-5 MCG/ML-% IV SOLN
0.0000 ug/min | Freq: Once | INTRAVENOUS | Status: AC
  Administered 2015-01-31: 5 ug/min via INTRAVENOUS
  Filled 2015-01-31: qty 250

## 2015-01-31 MED ORDER — ASPIRIN 81 MG PO CHEW
324.0000 mg | CHEWABLE_TABLET | Freq: Once | ORAL | Status: AC
  Administered 2015-01-31: 324 mg via ORAL
  Filled 2015-01-31: qty 4

## 2015-01-31 MED ORDER — SODIUM CHLORIDE 0.9 % IV SOLN
INTRAVENOUS | Status: DC
  Administered 2015-01-31: 08:00:00 via INTRAVENOUS

## 2015-01-31 MED ORDER — HEPARIN (PORCINE) IN NACL 100-0.45 UNIT/ML-% IJ SOLN
800.0000 [IU]/h | INTRAMUSCULAR | Status: DC
  Administered 2015-01-31: 800 [IU]/h via INTRAVENOUS
  Filled 2015-01-31: qty 250

## 2015-01-31 MED ORDER — ONDANSETRON HCL 4 MG/2ML IJ SOLN: 4.0000 mg | Freq: Once | INTRAMUSCULAR | Status: DC

## 2015-01-31 NOTE — ED Notes (Signed)
Report from morgan, rn.  Pt care assumed.  Pt c/o 3/10 chest pain, nitro drip adjusted accordingly

## 2015-01-31 NOTE — Progress Notes (Signed)
ANTICOAGULATION CONSULT NOTE - Initial Consult  Pharmacy Consult for Heparin Indication: chest pain/ACS  Allergies  Allergen Reactions  . Sulfa Drugs Cross Reactors Shortness Of Breath and Palpitations  . Peanut-Containing Drug Products     swelling    Patient Measurements:    Ht: 66 in     Wt: ~66 kg  IBW: 59 kg Heparin Dosing Weight: 66 kg  Vital Signs:    Labs: No results for input(s): HGB, HCT, PLT, APTT, LABPROT, INR, HEPARINUNFRC, CREATININE, CKTOTAL, CKMB, TROPONINI in the last 72 hours.  CrCl cannot be calculated (Unknown ideal weight.).   Medical History: Past Medical History  Diagnosis Date  . Hypercholesteremia   . Hypertension   . Coronary artery disease     a. 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 non obs dz LHC 2007. c. Non obs [facility]-mod) [facility]17[facility].   Marland Kitchen. GERD (gastroesophageal reflux disease)   . Diverticulitis     a. Hx microperf 2012 - hospitalizated.  Marland Kitchen. UTI (lower urinary tract infection)   . Cluster headache   . Seizures   . Arthritis   . Chronic lower back pain   . PVC's (premature ventricular contractions)     a. Hx of trigeminy/bigeminy.    Medications:  See electronic med rec  Assessment: 69 y.o. female presents with chest pain/pressure. To begin heparin for r/o ACS. Baseline labs pending.  Goal of Therapy:  Heparin level 0.3-0.7 units/ml Monitor platelets by anticoagulation protocol: Yes   Plan:  Heparin IV bolus 4000 units Heparin IV gtt at 800 units/hr F/u 6 hr heparin level Daily heparin level and CBC  Christoper Fabianaron Yailin Biederman, PharmD, BCPS Clinical pharmacist, pager [phone] 01/31/2015,8:02 AM

## 2015-01-31 NOTE — ED Notes (Signed)
Pt states she was having "whole body spasms" was using nitro to make chest pain go away and it helped. Still has heavy discomfort. Husband of 44 years died a few days ago. Now lives alone.

## 2015-01-31 NOTE — Discharge Instructions (Signed)
Discharge instructions as per cardiology. Nitroglycerin renewed. They are going to have you start on a new medication. Take as directed. Follow-up with Dr. Katrinka BlazingSmith. Return for any new or worse symptoms. Stop the Lopressor.

## 2015-01-31 NOTE — Consult Note (Signed)
Admit date: 01/31/2015 Referring Physician  Dr. Ruthy Dick Primary Physician Lesleigh Noe, MD Primary Cardiologist  Dr. Katrinka Blazing Reason for Consultation  Chest pain  HPI: 69 year old female with mild coronary artery disease seen on cardiac catheterization in July 2015 with diffuse LAD vasospasm that was responsive to nitroglycerin with history of coronary vasospasm on high-dose Imdur as well as amlodipine who presented to emergency room with chest discomfort among asked other somatic symptoms. She tells me that last night she had unexplained shivers or shakes with associated cramping throughout her whole body that were quite severe. She has experienced similar episodes to this but not as severe. She denies any cough, fevers, incontinence. No focal abnormalities. She also reported epigastric chest discomfort as well which seemed to respond to nitroglycerin IV once in emergency department but then returned shortly thereafter. Her EKG shows nonspecific ST-T wave changes which are unchanged from prior. Cardiac catheterization reviewed. 2 troponins are normal. Her blood pressure is mildly elevated at 150-160 systolic. When I entered the room for consultation, she was sleeping soundly and easily arousable.  She states compliance with her medications. She also states that she would need a new prescription for sublingual nitroglycerin.  She is currently chest pain-free.    PMH:   Past Medical History  Diagnosis Date  . Hypercholesteremia   . Hypertension   . Coronary artery disease     a. 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 non obs dz LHC 2007. c. Non obs dz (mild-mod) by [facility] 05/17/14.   Marland Kitchen GERD (gastroesophageal reflux disease)   . Diverticulitis     a. Hx microperf 2012 - hospitalizated.  Marland Kitchen UTI (lower urinary tract infection)   . Cluster headache   . Seizures   . Arthritis   . Chronic lower back pain   . PVC's (premature ventricular contractions)     a. Hx of trigeminy/bigeminy.    PSH:   Past  Surgical History  Procedure Laterality Date  . Joint replacement      right knee  . Vascular surgery    . Coronary angioplasty with stent placement  1995    "1"  . Cardiac catheterization  05/17/2014  . Total knee arthroplasty Right 2005  . Abdominal hysterectomy  1987    "partial"-still has ovaries  . Tubal ligation  1970  . Left heart catheterization with coronary angiogram N/A 05/17/2014    Procedure: LEFT HEART CATHETERIZATION WITH CORONARY ANGIOGRAM;  Surgeon: Marykay Lex, MD;  Location: [facility] CATH LAB;  Service: Cardiovascular;  Laterality: N/A;   Allergies:  Sulfa drugs cross reactors Prior to Admit Meds:   Prior to Admission medications   Medication Sig Start Date End Date Taking? Authorizing Provider  ACAI PO Take 1 capsule by mouth daily.   Yes Historical Provider, MD  acetaminophen (TYLENOL) 500 MG tablet Take 1,000 mg by mouth every 6 (six) hours as needed. For pain   Yes Historical Provider, MD  Aloe Vera 25 MG CAPS Take 1 capsule by mouth daily.   Yes Historical Provider, MD  amLODipine (NORVASC) 10 MG tablet Take 10 mg by mouth daily.    Yes Historical Provider, MD  aspirin EC 81 MG EC tablet Take 1 tablet (81 mg total) by mouth daily. 05/18/14  Yes Janetta Hora, PA-C  atorvastatin (LIPITOR) 20 MG tablet Take 20 mg by mouth daily.     Yes Historical Provider, MD  Bee Pollen 550 MG CAPS Take 1 capsule by mouth daily.   Yes Historical  Provider, MD  cholecalciferol (VITAMIN D) 400 UNITS TABS tablet Take 400 Units by mouth daily.   Yes Historical Provider, MD  Cyanocobalamin (VITAMIN B 12 PO) Take 1 capsule by mouth daily.   Yes Historical Provider, MD  ezetimibe (ZETIA) 10 MG tablet Take 10 mg by mouth daily.     Yes Historical Provider, MD  HYDROcodone-acetaminophen (NORCO) 10-325 MG per tablet Take 1 tablet by mouth 4 (four) times daily as needed. 01/08/15  Yes Historical Provider, MD  HYDROcodone-acetaminophen (VICODIN ES) 7.5-750 MG per tablet Take 1 tablet by mouth  every 6 (six) hours as needed. 10/06/11  Yes Simbiso Ranga, MD  isosorbide mononitrate (IMDUR) 60 MG 24 hr tablet Take 120 mg by mouth daily.   Yes Historical Provider, MD  metoprolol tartrate (LOPRESSOR) 25 MG tablet Take 0.5 tablets (12.5 mg total) by mouth 2 (two) times daily. 05/18/14  Yes Kelle DartingBryan W Hager, PA-C  NEXIUM 40 MG capsule Take 80 mg by mouth daily.  05/24/14  Yes Historical Provider, MD  nitroGLYCERIN (NITROSTAT) 0.4 MG SL tablet Place 1 tablet (0.4 mg total) under the tongue every 5 (five) minutes x 3 doses as needed for chest pain. 05/18/14  Yes Dwana MelenaBryan W Hager, PA-C  oxybutynin (DITROPAN-XL) 5 MG 24 hr tablet Take 5 mg by mouth daily.    Yes Historical Provider, MD  potassium chloride (MICRO-K) 10 MEQ CR capsule Take 10 mEq by mouth 2 (two) times daily.    Yes Historical Provider, MD  traMADol (ULTRAM) 50 MG tablet Take 50 mg by mouth 2 (two) times daily as needed. Maximum dose= 8 tablets per day for pain   Yes Historical Provider, MD  venlafaxine (EFFEXOR-XR) 150 MG 24 hr capsule Take 150 mg by mouth at bedtime.    Yes Historical Provider, MD  vitamin C (ASCORBIC ACID) 500 MG tablet Take 500 mg by mouth daily.   Yes Historical Provider, MD  vitamin E (VITAMIN E) 1000 UNIT capsule Take 1,000 Units by mouth daily.   Yes Historical Provider, MD   Fam HX:    Family History  Problem Relation Age of Onset  . CAD Brother   . CAD Father   . Diabetes Mother     father, sister, brothers   Social HX:    History   Social History  . Marital Status: Married    Spouse Name: N/A  . Number of Children: N/A  . Years of Education: N/A   Occupational History  . Not on file.   Social History Main Topics  . Smoking status: Never Smoker   . Smokeless tobacco: Never Used  . Alcohol Use: No  . Drug Use: No  . Sexual Activity: No   Other Topics Concern  . Not on file   Social History Narrative   Separated. 4 children from first marriage. 16 grandkids.       Retired from [employer] for  25 years, went to [employer] for 5 years. Retired 2003 after MI      Hobbies: babysit/active with children     ROS:  All 11 ROS were addressed and are negative except what is stated in the HPI   Physical Exam: Blood pressure 165/83, pulse 84, resp. rate 23, SpO2 98 %.   General: Well developed, thin in no acute distress, originally sleeping soundly but easily arousable. Head: Eyes PERRLA, No xanthomas.   Normal cephalic and atramatic  Lungs:   Clear bilaterally to auscultation and percussion. Normal respiratory effort. No wheezes, no rales.  Heart:   HRRR S1 S2 Pulses are 2+ & equal. 1/6 systolic left lower sternal border murmur, rubs, gallops.  No carotid bruit. No JVD.  No abdominal bruits.  Abdomen: Bowel sounds are positive, abdomen soft and non-tender without masses. No hepatosplenomegaly. Msk:  Back normal. Normal strength and tone for age. Extremities:  No clubbing, cyanosis or edema.  DP +1 Neuro: Alert and oriented X 3, non-focal, MAE x 4 GU: Deferred Rectal: Deferred Psych:  Good affect, responds appropriately      Labs: Lab Results  Component Value Date   WBC 3.8* 01/31/2015   HGB 13.3 01/31/2015   HCT 39.7 01/31/2015   MCV 95.0 01/31/2015   PLT 220 01/31/2015     Recent Labs Lab 01/31/15 0750  NA 140  K 3.4*  CL 104  CO2 25  BUN 10  CREATININE 0.89  CALCIUM 9.7  PROT 7.8  BILITOT 1.4*  ALKPHOS 65  ALT 23  AST 27  GLUCOSE 103*    Lab Results  Component Value Date   CHOL 134 05/18/2014   HDL 46 05/18/2014   LDLCALC 69 05/18/2014   TRIG 93 05/18/2014      Radiology:  Dg Chest Port 1 View  01/31/2015   CLINICAL DATA:  Chest pain.  EXAM: PORTABLE CHEST - 1 VIEW  COMPARISON:  May 17, 2014.  FINDINGS: Stable cardiomegaly. No pneumothorax or pleural effusion is noted. Both lungs are clear. The visualized skeletal structures are unremarkable.  IMPRESSION: No acute cardiopulmonary abnormality seen.   Electronically Signed   By: Lupita Raider, M.D.   On:  01/31/2015 08:22   Personally viewed.  EKG: Sinus rhythm with nonspecific ST-T wave changes, subtle T-wave inversion seen previously on prior EKGs. Personally viewed.   Echocardiogram: 07/09/09-normal ejection fraction, mild to moderate mitral regurgitation  Cardiac catheterization 04/2014-mild CAD, LAD vasospasm.  ASSESSMENT/PLAN:    69 year old female with mild coronary artery disease, LAD vasospasm noted on catheterization in July 2015, here with chest discomfort/epigastric discomfort with other somatic complaints.  1. Chest pain-this seems to be a chronic condition that has occasional worsened episodes. She is on high-dose isosorbide 120 mg once a day. She is also on amlodipine 10 mg once a day. I will discontinue her Lopressor 25 mg twice a day and start carvedilol 6.25 mg twice a day which has now for blocker and may help with vasospastic-like symptoms. Her troponins are normal. EKG is unchanged. We will stop the IV nitroglycerin. We will give her a prescription for sublingual nitroglycerin. Previous cardiac catheterization was Reassuring. Other possibilities for her discomfort include esophageal spasm. She has had a barium swallow in 2012 which was normal. Nonetheless, she is being effectively treated for this with calcium channel blocker as well as nitrates. After discussing her clinical symptoms, she was more concerned about the diffuse body shakes that she was having last night. I explained that I did not have a good answer for the symptoms however she did not have any evidence of infection, fever, seizures or myocardial infarction. Perhaps a slightly low potassium exacerbated cramping. I'm comfortable with her being discharged from the emergency room with close follow-up. We will arrange.  2. Essential hypertension-mildly elevated currently. I am stopping metoprolol and starting carvedilol 6.25 mg twice a day. Increase as needed in outpatient setting.  3. Hyperlipidemia-continue  atorvastatin.  Donato Schultz, MD  01/31/2015  1:12 PM

## 2015-01-31 NOTE — ED Notes (Signed)
Pt reports increase in chest pain, feeling of choking similar to but worse than event this am.  Dr Deretha Emoryzackowski notified, repeat EKG obtained, awaiting orders.

## 2015-01-31 NOTE — ED Provider Notes (Addendum)
CSN: [id number]     Arrival date & time 01/31/15  0725 History   First MD Initiated Contact with Patient 01/31/15 [phone]     Chief Complaint  Patient presents with  . Chest Pain     (Consider location/radiation/quality/duration/timing/severity/associated sxs/prior Treatment) Patient is a 69 y.o. female presenting with chest pain. The history is provided by the patient.  Chest Pain Associated symptoms: no abdominal pain, no back pain, no fever, no headache, no nausea, no shortness of breath and not vomiting    patient with onset of upper sternal chest pain with radiation to the left shoulder at 11 PM yesterday. Patient took several of her nitroglycerin throughout the night with some relief but then the pain would come back. Associated with nausea vomiting or shortness of breath. Patient states she has had similar pain like this in the past but usually her nitroglycerin takes the pain away. This time it Coming back. Also associated with the which she calls spasms of her body which based on description her like tremors.  Patient with admission by cardiology in July 2015 also had cardiac cath at that time. [facility]. Patient states that the chest pain is currently 8 out of 10. Pressure in nature.   Past Medical History  Diagnosis Date  . Hypercholesteremia   . Hypertension   . Coronary artery disease     a. 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 non obs dz LHC 2007. c. [facility].   Marland Kitchen. GERD (gastroesophageal reflux disease)   . Diverticulitis     a. Hx microperf 2012 - hospitalizated.  Marland Kitchen. UTI (lower urinary tract infection)   . Cluster headache   . Seizures   . Arthritis   . Chronic lower back pain   . PVC's (premature ventricular contractions)     a. Hx of trigeminy/bigeminy.   Past Surgical History  Procedure Laterality Date  . Joint replacement      right knee  . Vascular surgery    . Coronary angioplasty with stent placement  1995    "1"  .  Cardiac catheterization  05/17/2014  . Total knee arthroplasty Right 2005  . Abdominal hysterectomy  1987    "partial"-still has ovaries  . Tubal ligation  1970  . Left heart catheterization with coronary angiogram N/A 05/17/2014    Procedure: LEFT HEART CATHETERIZATION WITH CORONARY ANGIOGRAM;  Surgeon: Marykay Lexavid W Harding, MD;  [facility];  Service: Cardiovascular;  Laterality: N/A;   Family History  Problem Relation Age of Onset  . CAD Brother   . CAD Father   . Diabetes Mother     father, sister, brothers   History  Substance Use Topics  . Smoking status: Never Smoker   . Smokeless tobacco: Never Used  . Alcohol Use: No   OB History    No data available     Review of Systems  Constitutional: Negative for fever.  HENT: Negative for congestion.   Eyes: Negative for visual disturbance.  Respiratory: Negative for shortness of breath.   Cardiovascular: Positive for chest pain. Negative for leg swelling.  Gastrointestinal: Negative for nausea, vomiting and abdominal pain.  Genitourinary: Negative for dysuria.  Musculoskeletal: Negative for back pain.  Skin: Negative for rash.  Neurological: Positive for tremors. Negative for headaches.  Hematological: Does not bruise/bleed easily.  Psychiatric/Behavioral: Negative for confusion.      Allergies  Sulfa drugs cross reactors  Home Medications   Prior to  Admission medications   Medication Sig Start Date End Date Taking? Authorizing Provider  ACAI PO Take 1 capsule by mouth daily.   Yes Historical Provider, MD  acetaminophen (TYLENOL) 500 MG tablet Take 1,000 mg by mouth every 6 (six) hours as needed. For pain   Yes Historical Provider, MD  Aloe Vera 25 MG CAPS Take 1 capsule by mouth daily.   Yes Historical Provider, MD  amLODipine (NORVASC) 10 MG tablet Take 10 mg by mouth daily.    Yes Historical Provider, MD  aspirin EC 81 MG EC tablet Take 1 tablet (81 mg total) by mouth daily. 05/18/14  Yes Janetta Hora,  PA-C  atorvastatin (LIPITOR) 20 MG tablet Take 20 mg by mouth daily.     Yes Historical Provider, MD  Bee Pollen 550 MG CAPS Take 1 capsule by mouth daily.   Yes Historical Provider, MD  cholecalciferol (VITAMIN D) 400 UNITS TABS tablet Take 400 Units by mouth daily.   Yes Historical Provider, MD  Cyanocobalamin (VITAMIN B 12 PO) Take 1 capsule by mouth daily.   Yes Historical Provider, MD  ezetimibe (ZETIA) 10 MG tablet Take 10 mg by mouth daily.     Yes Historical Provider, MD  HYDROcodone-acetaminophen (NORCO) 10-325 MG per tablet Take 1 tablet by mouth 4 (four) times daily as needed. 01/08/15  Yes Historical Provider, MD  HYDROcodone-acetaminophen (VICODIN ES) 7.5-750 MG per tablet Take 1 tablet by mouth every 6 (six) hours as needed. 10/06/11  Yes Simbiso Ranga, MD  isosorbide mononitrate (IMDUR) 60 MG 24 hr tablet Take 120 mg by mouth daily.   Yes Historical Provider, MD  metoprolol tartrate (LOPRESSOR) 25 MG tablet Take 0.5 tablets (12.5 mg total) by mouth 2 (two) times daily. 05/18/14  Yes Kelle Darting Hager, PA-C  NEXIUM 40 MG capsule Take 80 mg by mouth daily.  05/24/14  Yes Historical Provider, MD  nitroGLYCERIN (NITROSTAT) 0.4 MG SL tablet Place 1 tablet (0.4 mg total) under the tongue every 5 (five) minutes x 3 doses as needed for chest pain. 05/18/14  Yes Dwana Melena, PA-C  oxybutynin (DITROPAN-XL) 5 MG 24 hr tablet Take 5 mg by mouth daily.    Yes Historical Provider, MD  potassium chloride (MICRO-K) 10 MEQ CR capsule Take 10 mEq by mouth 2 (two) times daily.    Yes Historical Provider, MD  traMADol (ULTRAM) 50 MG tablet Take 50 mg by mouth 2 (two) times daily as needed. Maximum dose= 8 tablets per day for pain   Yes Historical Provider, MD  venlafaxine (EFFEXOR-XR) 150 MG 24 hr capsule Take 150 mg by mouth at bedtime.    Yes Historical Provider, MD  vitamin C (ASCORBIC ACID) 500 MG tablet Take 500 mg by mouth daily.   Yes Historical Provider, MD  vitamin E (VITAMIN E) 1000 UNIT capsule Take  1,000 Units by mouth daily.   Yes Historical Provider, MD   BP 151/69 mmHg  Pulse 76  Resp 24  SpO2 98% Physical Exam  Constitutional: She is oriented to person, place, and time. She appears well-developed and well-nourished. No distress.  HENT:  Head: Normocephalic and atraumatic.  Eyes: Conjunctivae and EOM are normal. Pupils are equal, round, and reactive to light.  Neck: Normal range of motion.  Cardiovascular: Normal rate, regular rhythm and normal heart sounds.   Pulmonary/Chest: Effort normal and breath sounds normal. No respiratory distress.  Abdominal: Soft. Bowel sounds are normal. There is no tenderness.  Musculoskeletal: Normal range of motion.  Neurological:  She is alert and oriented to person, place, and time. No cranial nerve deficit. She exhibits normal muscle tone. Coordination normal.  Skin: Skin is warm. No rash noted.  Nursing note and vitals reviewed.   ED Course  Procedures (including critical care time) Labs Review Labs Reviewed  CBC WITH DIFFERENTIAL/PLATELET - Abnormal; Notable for the following:    WBC 3.8 (*)    Neutrophils Relative % 42 (*)    Neutro Abs 1.6 (*)    All other components within normal limits  COMPREHENSIVE METABOLIC PANEL - Abnormal; Notable for the following:    Potassium 3.4 (*)    Glucose, Bld 103 (*)    Total Bilirubin 1.4 (*)    GFR calc non Af Amer 65 (*)    GFR calc Af Amer 75 (*)    All other components within normal limits  I-STAT TROPOININ, ED   Results for orders placed or performed during the hospital encounter of 01/31/15  CBC with Differential/Platelet  Result Value Ref Range   WBC 3.8 (L) 4.0 - 10.5 K/uL   RBC 4.18 3.87 - 5.11 MIL/uL   Hemoglobin 13.3 12.0 - 15.0 g/dL   HCT 96.0 45.4 - 09.8 %   MCV 95.0 78.0 - 100.0 fL   MCH 31.8 26.0 - 34.0 pg   MCHC 33.5 30.0 - 36.0 g/dL   RDW 11.9 14.7 - 82.9 %   Platelets 220 150 - 400 K/uL   Neutrophils Relative % 42 (L) 43 - 77 %   Neutro Abs 1.6 (L) 1.7 - 7.7 K/uL    Lymphocytes Relative 41 12 - 46 %   Lymphs Abs 1.6 0.7 - 4.0 K/uL   Monocytes Relative 11 3 - 12 %   Monocytes Absolute 0.4 0.1 - 1.0 K/uL   Eosinophils Relative 5 0 - 5 %   Eosinophils Absolute 0.2 0.0 - 0.7 K/uL   Basophils Relative 1 0 - 1 %   Basophils Absolute 0.0 0.0 - 0.1 K/uL  Comprehensive metabolic panel  Result Value Ref Range   Sodium 140 135 - 145 mmol/L   Potassium 3.4 (L) 3.5 - 5.1 mmol/L   Chloride 104 96 - 112 mmol/L   CO2 25 19 - 32 mmol/L   Glucose, Bld 103 (H) 70 - 99 mg/dL   BUN 10 6 - 23 mg/dL   Creatinine, Ser 5.62 0.50 - 1.10 mg/dL   Calcium 9.7 8.4 - 13.0 mg/dL   Total Protein 7.8 6.0 - 8.3 g/dL   Albumin 4.3 3.5 - 5.2 g/dL   AST 27 0 - 37 U/L   ALT 23 0 - 35 U/L   Alkaline Phosphatase 65 39 - 117 U/L   Total Bilirubin 1.4 (H) 0.3 - 1.2 mg/dL   GFR calc non Af Amer 65 (L) >90 mL/min   GFR calc Af Amer 75 (L) >90 mL/min   Anion gap 11 5 - 15  I-Stat Troponin, ED (not at [facility])  Result Value Ref Range   Troponin i, poc 0.01 0.00 - 0.08 ng/mL   Comment 3             Imaging Review Dg Chest Port 1 View  01/31/2015   CLINICAL DATA:  Chest pain.  EXAM: PORTABLE CHEST - 1 VIEW  COMPARISON:  May 17, 2014.  FINDINGS: Stable cardiomegaly. No pneumothorax or pleural effusion is noted. Both lungs are clear. The visualized skeletal structures are unremarkable.  IMPRESSION: No acute cardiopulmonary abnormality seen.   Electronically Signed  By: Lupita Raider, M.D.   On: 01/31/2015 08:22     EKG Interpretation   Date/Time:  Thursday January 31 2015 07:48:33 EDT Ventricular Rate:  72 PR Interval:  115 QRS Duration: 98 QT Interval:  500 QTC Calculation: 547 R Axis:   67 Text Interpretation:  Sinus rhythm Borderline short PR interval Repol  abnrm suggests ischemia, anterolateral Prolonged QT interval No  significant change since last tracing Confirmed by Avik Leoni  MD, Muriel Wilber  [phone]) on 01/31/2015 7:54:16 AM      CRITICAL CARE Performed by:  Vanetta Mulders Total critical care time: 30 Critical care time was exclusive of separately billable procedures and treating other patients. Critical care was necessary to treat or prevent imminent or life-threatening deterioration. Critical care was time spent personally by me on the following activities: development of treatment plan with patient and/or surrogate as well as nursing, discussions with consultants, evaluation of patient's response to treatment, examination of patient, obtaining history from patient or surrogate, ordering and performing treatments and interventions, ordering and review of laboratory studies, ordering and review of radiographic studies, pulse oximetry and re-evaluation of patient's condition.      MDM   Final diagnoses:  Chest pain  Unstable angina    Review of cardiology notes show that patient did have a cardiac cath in the July 2015. Only had mild to moderate coronary disease. But there was a statement about the problems with coronary artery spasm. Patient's current symptoms may be related to that. Patient started on nitroglycerin drip here with significant improvement in her chest pain from a 6-8 out of 10 down to 1 out of 10. Patient also started on heparin due to some subtle ST segment depressions anterior laterally. Discussed with cardiology. They will see in consultation.  Initial cardiac marker was negative EKG with the mention of the ST segment changes. Chest x-ray negative initial labs without significant abnormality.    Vanetta Mulders, MD 01/31/15 1122   Addendum: Cardiology eventually had to see patient around 1:30 in the afternoon. Patient on the nitro drip did have an increase in her chest pain again despite the nitro drip. Made [place] think that probably this may not be an anginal equivalent kind of pain. In addition spray P troponin was also negative. Cardiology feels that the and I don't disagree at this point in time that this is probably not  cardiac in nature. Patient's heparin and nitroglycerin drip will be stopped. Her sublingual nitroglycerin will be renewed because she used most of it up during the night. They're going to stop her Lopressor and start Coreg. Patient will follow-up with them as an outpatient.  Vanetta Mulders, MD 01/31/15 1327

## 2015-01-31 NOTE — ED Notes (Signed)
Repeat EKG performed per Dr Deretha EmoryZackowski

## 2015-02-19 NOTE — Progress Notes (Signed)
This encounter was created in error - please disregard.

## 2015-02-22 ENCOUNTER — Encounter: Payer: Medicare Other | Admitting: Physician Assistant

## 2015-04-25 ENCOUNTER — Telehealth: Payer: Self-pay | Admitting: Family Medicine

## 2015-04-25 ENCOUNTER — Other Ambulatory Visit: Payer: Self-pay

## 2015-04-25 MED ORDER — CARVEDILOL 6.25 MG PO TABS: 6.2500 mg | ORAL_TABLET | Freq: Two times a day (BID) | ORAL | Status: DC

## 2015-04-25 MED ORDER — POTASSIUM CHLORIDE ER 10 MEQ PO CPCR: 10.0000 meq | ORAL_CAPSULE | Freq: Two times a day (BID) | ORAL | Status: DC

## 2015-04-25 NOTE — Telephone Encounter (Signed)
meds refilled. Dr. Durene CalHunter see last section of this message please.

## 2015-04-25 NOTE — Telephone Encounter (Signed)
Lm on vm for pt that refills sent in, but no appt tomorrow. Asked pt to cb and sch a fu for her next visit at the next available slot.

## 2015-04-25 NOTE — Telephone Encounter (Signed)
Is there a specific concern she needs us to address? Is there a reason she missed in march?   Most likely, she needs to be worked in over coming weeks where there is a slot available

## 2015-04-25 NOTE — Telephone Encounter (Signed)
See below Mrs. Katherine MessierKathy. Pt will not be able to be seen tomorrow, please find another slot for her.

## 2015-04-25 NOTE — Telephone Encounter (Signed)
Pt walked in office to request refills of the following meds:  carvedilol (COREG) 6.25 MG tablet  potassium chloride (MICRO-K) 10 MEQ  Pt states she is out of both of these meds and has not taken her potassium in 2 weeks.  [facility] [address]  Also, pt was supposed to follow up with Dr. Durene CalHunter in March 2016.  However, she never came in.  Pt is requesting to be worked in with PCP on tomorrow, July 1st.  Please advise if pt can be worked in [employer] executivetomorrow.

## 2015-05-16 ENCOUNTER — Encounter: Payer: Self-pay | Admitting: Family Medicine

## 2015-05-16 ENCOUNTER — Ambulatory Visit (INDEPENDENT_AMBULATORY_CARE_PROVIDER_SITE_OTHER): Payer: Medicare Other | Admitting: Family Medicine

## 2015-05-16 VITALS — BP 180/98 | HR 71 | Temp 98.1°F | Wt 141.0 lb

## 2015-05-16 DIAGNOSIS — Z1211 Encounter for screening for malignant neoplasm of colon: Secondary | ICD-10-CM | POA: Diagnosis not present

## 2015-05-16 DIAGNOSIS — G43109 Migraine with aura, not intractable, without status migrainosus: Secondary | ICD-10-CM | POA: Diagnosis not present

## 2015-05-16 DIAGNOSIS — E876 Hypokalemia: Secondary | ICD-10-CM

## 2015-05-16 DIAGNOSIS — I10 Essential (primary) hypertension: Secondary | ICD-10-CM

## 2015-05-16 DIAGNOSIS — E7800 Pure hypercholesterolemia, unspecified: Secondary | ICD-10-CM

## 2015-05-16 DIAGNOSIS — I25119 Atherosclerotic heart disease of native coronary artery with unspecified angina pectoris: Secondary | ICD-10-CM | POA: Diagnosis not present

## 2015-05-16 MED ORDER — POTASSIUM CHLORIDE ER 10 MEQ PO CPCR: 10.0000 meq | ORAL_CAPSULE | Freq: Two times a day (BID) | ORAL | Status: DC

## 2015-05-16 MED ORDER — EZETIMIBE 10 MG PO TABS: 10.0000 mg | ORAL_TABLET | Freq: Every day | ORAL | Status: DC

## 2015-05-16 MED ORDER — VENLAFAXINE HCL ER 150 MG PO CP24: 150.0000 mg | ORAL_CAPSULE | Freq: Every day | ORAL | Status: DC

## 2015-05-16 MED ORDER — CARVEDILOL 6.25 MG PO TABS: 6.2500 mg | ORAL_TABLET | Freq: Two times a day (BID) | ORAL | Status: DC

## 2015-05-16 MED ORDER — ATORVASTATIN CALCIUM 20 MG PO TABS: 20.0000 mg | ORAL_TABLET | Freq: Every day | ORAL | Status: DC

## 2015-05-16 MED ORDER — ISOSORBIDE MONONITRATE ER 60 MG PO TB24
120.0000 mg | ORAL_TABLET | Freq: Every day | ORAL | Status: DC
Start: 2015-05-16 — End: 2016-01-06

## 2015-05-16 MED ORDER — NITROGLYCERIN 0.4 MG SL SUBL: 0.4000 mg | SUBLINGUAL_TABLET | SUBLINGUAL | Status: DC | PRN

## 2015-05-16 MED ORDER — AMLODIPINE BESYLATE 10 MG PO TABS
10.0000 mg | ORAL_TABLET | Freq: Every day | ORAL | Status: DC
Start: 2015-05-16 — End: 2015-11-25

## 2015-05-16 NOTE — Patient Instructions (Addendum)
Get mammogram scheduled and have results sent to [place] at [phone].  [facility] will call you to schedule repeat colonoscopy.  Get bone density scheduled at check out.      Schedule follow up visit next week with me (may use same day slot for front desk).   BRING ALL YOUR MEDICATIONS  Refilled everything but your oxybutynin which your GYN prescribes as well as your acute pain medicine for headache- vicodin and tramadol until we get records from previous doctors.   Requests records for last 5 years from Dr. Jamal Collin [facility]. At front desk. Request records from your headache doctor.

## 2015-05-16 NOTE — Assessment & Plan Note (Addendum)
Stable angina pattern. Continue ASA, atorvastatin, zetia, imdur. I refilled all of these meds including imdur which was listed as  daily in last cardiology note.

## 2015-05-16 NOTE — Assessment & Plan Note (Signed)
Poor control as ran out of medication and not clear which one (appears did not run out of imdur though. Refilled amlodipine, carvedilol, imdur and asked patient to bring all meds to next visit and we would review. She was also listed as having metoprolol but this would have run out 6 months ago whereas carvedilol woul dnot have so we stopped metoprolol and continued coreg alone. I did advise her to return to see Dr. Katrinka Blazing of cardiology- advised her to call today to schedule.

## 2015-05-16 NOTE — Assessment & Plan Note (Signed)
Refilled potassium, consider bmet at follow up.

## 2015-05-16 NOTE — Progress Notes (Signed)
Tana Conch, MD  Subjective:  [name] is a 69 y.o. year old very pleasant female patient who presents with:  Hypertension-poor control as patient has run out of BP meds though states still has some- unclear which CAD with angina. Stable with 1-2x a month nitroglycerin as long as using imdur (thinks she is taking this) Hyperlipidemia- compliant with atorvastatin and has not run out reportedly.  BP Readings from Last 3 Encounters:  05/16/15 180/98  01/31/15 143/86  09/19/14 112/64   Home BP monitoring-no Compliant with medications-ran out ROS-Denies any SOB, blurry vision, LE edema, transient weakness, orthopnea, PND.   Past Medical History- seizure history reported (definitely avoidign tramadol), hyperglycemia, arthritis.   Medications- reviewed and updated Current Outpatient Prescriptions  Medication Sig Dispense Refill  . ACAI PO Take 1 capsule by mouth daily.    . Aloe Vera 25 MG CAPS Take 1 capsule by mouth daily.    Marland Kitchen amLODipine (NORVASC) 10 MG tablet Take 10 mg by mouth daily.     Marland Kitchen aspirin EC 81 MG EC tablet Take 1 tablet (81 mg total) by mouth daily.    Marland Kitchen atorvastatin (LIPITOR) 20 MG tablet Take 20 mg by mouth daily.      Alphonsus Sias Pollen 550 MG CAPS Take 1 capsule by mouth daily.    . carvedilol (COREG) 6.25 MG tablet Take 1 tablet (6.25 mg total) by mouth 2 (two) times daily with a meal. 60 tablet 2  . cholecalciferol (VITAMIN D) 400 UNITS TABS tablet Take 400 Units by mouth daily.    . Cyanocobalamin (VITAMIN B 12 PO) Take 1 capsule by mouth daily.    Marland Kitchen ezetimibe (ZETIA) 10 MG tablet Take 10 mg by mouth daily.      . isosorbide mononitrate (IMDUR) 60 MG 24 hr tablet Take 120 mg by mouth daily.    . metoprolol tartrate (LOPRESSOR) 25 MG tablet Take 0.5 tablets (12.5 mg total) by mouth 2 (two) times daily. 60 tablet 5  . NEXIUM 40 MG capsule Take 80 mg by mouth daily.     Marland Kitchen oxybutynin (DITROPAN-XL) 5 MG 24 hr tablet Take 5 mg by mouth daily.     . potassium chloride  (MICRO-K) 10 MEQ CR capsule Take 1 capsule (10 mEq total) by mouth 2 (two) times daily. 30 capsule 5  . venlafaxine (EFFEXOR-XR) 150 MG 24 hr capsule Take 150 mg by mouth at bedtime.     . vitamin C (ASCORBIC ACID) 500 MG tablet Take 500 mg by mouth daily.    . vitamin E (VITAMIN E) 1000 UNIT capsule Take 1,000 Units by mouth daily.    Marland Kitchen acetaminophen (TYLENOL) 500 MG tablet Take 1,000 mg by mouth every 6 (six) hours as needed. For pain    . HYDROcodone-acetaminophen (NORCO) 10-325 MG per tablet Take 1 tablet by mouth 4 (four) times daily as needed.  0  . HYDROcodone-acetaminophen (VICODIN ES) 7.5-750 MG per tablet Take 1 tablet by mouth every 6 (six) hours as needed. (Patient not taking: Reported on 05/16/2015) 30 tablet 0  . nitroGLYCERIN (NITROSTAT) 0.4 MG SL tablet Place 1 tablet (0.4 mg total) under the tongue every 5 (five) minutes x 3 doses as needed for chest pain. (Patient not taking: Reported on 05/16/2015) 25 tablet 12  . traMADol (ULTRAM) 50 MG tablet Take 50 mg by mouth 2 (two) times daily as needed. Maximum dose= 8 tablets per day for pain     Objective: BP 180/98 mmHg  Pulse 71  Temp(Src)  98.1 F (36.7 C)  Wt 141 lb (63.957 kg) Gen: NAD, resting comfortably CV: RRR no murmurs rubs or gallops Lungs: CTAB no crackles, wheeze, rhonchi Abdomen: soft/nontender/nondistended/normal bowel sounds.  Ext: no edema Skin: warm, dry, no rash Neuro: grossly normal, moves all extremities   Assessment/Plan:  CAD (coronary artery disease) Stable angina pattern. Continue ASA, atorvastatin, zetia, imdur. I refilled all of these meds including imdur which was listed as 120mg  daily in last cardiology note.   Migraine S: patient states her "head doctor" moved back to [place] and she still cannot recall his name. States venlafaxine helps with control but needs tramadol or vicodin when she has flares.  A/P: have no records of narcotics being used and most likely would prefer patient to see  neurology or headache clinic to discuss options. We will try to obtain records again today and refer as needed.    HTN (hypertension) Poor control as ran out of medication and not clear which one (appears did not run out of imdur though. Refilled amlodipine, carvedilol, imdur and asked patient to bring all meds to next visit and we would review. She was also listed as having metoprolol but this would have run out 6 months ago whereas carvedilol woul dnot have so we stopped metoprolol and continued coreg alone. I did advise her to return to see Dr. Katrinka Blazing of cardiology- advised her to call today to schedule.   Hypercholesteremia Controlled on atorvastatin 20mg  on last check- consider repeat labs at follow up.   Hypokalemia Refilled potassium, consider bmet at follow up.   1 week follow up with sooner return precautions given in regards to BP.   Orders Placed This Encounter  Procedures  . Ambulatory referral to Gastroenterology    Referral Priority:  Routine    Referral Type:  Consultation    Referral Reason:  Specialty Services Required    Number of Visits Requested:  1    Meds ordered this encounter  Medications  . amLODipine (NORVASC) 10 MG tablet    Sig: Take 1 tablet (10 mg total) by mouth daily.    Dispense:  30 tablet    Refill:  5  . atorvastatin (LIPITOR) 20 MG tablet    Sig: Take 1 tablet (20 mg total) by mouth daily.    Dispense:  30 tablet    Refill:  5  . carvedilol (COREG) 6.25 MG tablet    Sig: Take 1 tablet (6.25 mg total) by mouth 2 (two) times daily with a meal.    Dispense:  60 tablet    Refill:  5  . ezetimibe (ZETIA) 10 MG tablet    Sig: Take 1 tablet (10 mg total) by mouth daily.    Dispense:  30 tablet    Refill:  5  . isosorbide mononitrate (IMDUR) 60 MG 24 hr tablet    Sig: Take 2 tablets (120 mg total) by mouth daily.    Dispense:  60 tablet    Refill:  5  . nitroGLYCERIN (NITROSTAT) 0.4 MG SL tablet    Sig: Place 1 tablet (0.4 mg total) under the  tongue every 5 (five) minutes x 3 doses as needed for chest pain.    Dispense:  25 tablet    Refill:  12  . potassium chloride (MICRO-K) 10 MEQ CR capsule    Sig: Take 1 capsule (10 mEq total) by mouth 2 (two) times daily.    Dispense:  60 capsule    Refill:  5  . venlafaxine  XR (EFFEXOR-XR) 150 MG 24 hr capsule    Sig: Take 1 capsule (150 mg total) by mouth at bedtime.    Dispense:  30 capsule    Refill:  5

## 2015-05-16 NOTE — Assessment & Plan Note (Signed)
Controlled on atorvastatin  on last check- consider repeat labs at follow up.

## 2015-05-16 NOTE — Assessment & Plan Note (Signed)
S: patient states her "head doctor" moved back to [place] and she still cannot recall his name. States venlafaxine helps with control but needs tramadol or vicodin when she has flares.  A/P: have no records of narcotics being used and most likely would prefer patient to see neurology or headache clinic to discuss options. We will try to obtain records again today and refer as needed.

## 2015-05-23 ENCOUNTER — Encounter: Payer: Self-pay | Admitting: Family Medicine

## 2015-05-23 ENCOUNTER — Ambulatory Visit (INDEPENDENT_AMBULATORY_CARE_PROVIDER_SITE_OTHER): Payer: Medicare Other | Admitting: Family Medicine

## 2015-05-23 VITALS — BP 148/82 | HR 73 | Temp 99.8°F | Wt 141.0 lb

## 2015-05-23 DIAGNOSIS — N3281 Overactive bladder: Secondary | ICD-10-CM

## 2015-05-23 DIAGNOSIS — G43109 Migraine with aura, not intractable, without status migrainosus: Secondary | ICD-10-CM

## 2015-05-23 DIAGNOSIS — I10 Essential (primary) hypertension: Secondary | ICD-10-CM | POA: Diagnosis not present

## 2015-05-23 DIAGNOSIS — K219 Gastro-esophageal reflux disease without esophagitis: Secondary | ICD-10-CM

## 2015-05-23 LAB — BASIC METABOLIC PANEL
BUN: 12 mg/dL (ref 6–23)
CO2: 30 mEq/L (ref 19–32)
Calcium: 9.4 mg/dL (ref 8.4–10.5)
Chloride: 106 mEq/L (ref 96–112)
Creatinine, Ser: 0.83 mg/dL (ref 0.40–1.20)
GFR: 87.77 mL/min (ref >60.00)
GLUCOSE: 92 mg/dL (ref 70–99)
Potassium: 3.7 mEq/L (ref 3.5–5.1)
SODIUM: 140 meq/L (ref 135–145)

## 2015-05-23 MED ORDER — HYDROCODONE-ACETAMINOPHEN 5-325 MG PO TABS: 0.5000 | ORAL_TABLET | Freq: Four times a day (QID) | ORAL | Status: DC | PRN

## 2015-05-23 MED ORDER — OXYBUTYNIN CHLORIDE ER 5 MG PO TB24: 5.0000 mg | ORAL_TABLET | Freq: Every day | ORAL | Status: DC

## 2015-05-23 MED ORDER — ESOMEPRAZOLE MAGNESIUM 40 MG PO CPDR: 40.0000 mg | DELAYED_RELEASE_CAPSULE | Freq: Every day | ORAL | Status: DC

## 2015-05-23 NOTE — Assessment & Plan Note (Signed)
S:ran out of nexium 2 days ago, yesterday felt worsening symptoms A/P: restart nexium due to poor control

## 2015-05-23 NOTE — Assessment & Plan Note (Signed)
S:ran out of oxybutynin 2 weeks ago, having worsening problems- needing to rush to the bathroom, incontinence at times. Urine pads get soaked at times.  A/P: restart oxybutynin due to poor control

## 2015-05-23 NOTE — Patient Instructions (Addendum)
Headaches, take 500-650mg  of immediate release tylenol at first sign of headache. If symptoms not resolved or tolerable within 30 minutes, may take a 1/2 a vicodin at that time (only 2 available per week to avoid rebound headache).   Sign for release of information from your headache doctor at the front desk. Need these records if we are going to be able to continue to prescribe the vicodin and still may refer you to headache clinic  BP Readings from Last 3 Encounters:  05/23/15 148/82  05/16/15 180/98  01/31/15 143/86  Blood pressure has been too high each visit. I want to add a medicine called lisinopril but need to know where kidney function and potassium stand first. Once we know this, we will call you with instructions, but we may slow down the use of potassium if we start this medicine

## 2015-05-23 NOTE — Progress Notes (Signed)
Tana Conch, MD  Subjective:  [name] is a 69 y.o. year old very pleasant female patient who presents with: See problem oriented charting ROS- does endorse headaches. Denies chest pain other than reflux symptoms off of PPI, shortness of breath. No leg weakness or fecal incontinence  Past Medical History- seizures (avoid tramadol), migraine, CAD, hyperglycemia, HLD, HTN, OAB  Medications- reviewed and updated Current Outpatient Prescriptions  Medication Sig Dispense Refill  . ACAI PO Take 1 capsule by mouth daily.    Marland Kitchen acetaminophen (TYLENOL) 500 MG tablet Take 1,000 mg by mouth every 6 (six) hours as needed. For pain    . Aloe Vera 25 MG CAPS Take 1 capsule by mouth daily.    Marland Kitchen amLODipine (NORVASC) 10 MG tablet Take 1 tablet (10 mg total) by mouth daily. 30 tablet 5  . aspirin EC 81 MG EC tablet Take 1 tablet (81 mg total) by mouth daily.    Marland Kitchen atorvastatin (LIPITOR) 20 MG tablet Take 1 tablet (20 mg total) by mouth daily. 30 tablet 5  . Bee Pollen 550 MG CAPS Take 1 capsule by mouth daily.    . carvedilol (COREG) 6.25 MG tablet Take 1 tablet (6.25 mg total) by mouth 2 (two) times daily with a meal. 60 tablet 5  . cholecalciferol (VITAMIN D) 400 UNITS TABS tablet Take 400 Units by mouth daily.    . Cyanocobalamin (VITAMIN B 12 PO) Take 1 capsule by mouth daily.    Marland Kitchen ezetimibe (ZETIA) 10 MG tablet Take 1 tablet (10 mg total) by mouth daily. 30 tablet 5  . HYDROcodone-acetaminophen (VICODIN ES) 7.5-750 MG per tablet Take 1 tablet by mouth every 6 (six) hours as needed. (Patient not taking: Reported on 05/16/2015) 30 tablet 0  . isosorbide mononitrate (IMDUR) 60 MG 24 hr tablet Take 2 tablets (120 mg total) by mouth daily. 60 tablet 5  . NEXIUM 40 MG capsule Take 80 mg by mouth daily.     . nitroGLYCERIN (NITROSTAT) 0.4 MG SL tablet Place 1 tablet (0.4 mg total) under the tongue every 5 (five) minutes x 3 doses as needed for chest pain. 25 tablet 12  . oxybutynin (DITROPAN-XL) 5 MG 24 hr  tablet Take 5 mg by mouth daily.     . potassium chloride (MICRO-K) 10 MEQ CR capsule Take 1 capsule (10 mEq total) by mouth 2 (two) times daily. 60 capsule 5  . traMADol (ULTRAM) 50 MG tablet Take 50 mg by mouth 2 (two) times daily as needed. Maximum dose= 8 tablets per day for pain    . venlafaxine XR (EFFEXOR-XR) 150 MG 24 hr capsule Take 1 capsule (150 mg total) by mouth at bedtime. 30 capsule 5  . vitamin C (ASCORBIC ACID) 500 MG tablet Take 500 mg by mouth daily.    . vitamin E (VITAMIN E) 1000 UNIT capsule Take 1,000 Units by mouth daily.     Objective: BP 148/82 mmHg  Pulse 73  Temp(Src) 99.8 F (37.7 C)  Wt 141 lb (63.957 kg) Gen: NAD, resting comfortably CV: RRR no murmurs rubs or gallops Lungs: CTAB no crackles, wheeze, rhonchi Abdomen: soft/nontender/nondistended/normal bowel sounds. No rebound or guarding.  Ext: no edema Skin: warm, dry, no rash Neuro: grossly normal, moves all extremities, normal gait  Assessment/Plan:  Migraine S: continues to have headaches and has had some severe headaches with nothing helping as no longer has tramadol or vicodin. She did remember name of headache doctor and will request ROI at front desk.  A/P: cant use tramadol due to seizures, already on prophylaxis with beta blocker and amlodipine, triptans cant be used due to CAD. Options very limited. We ultimately agreed to half of a vicodin 5/325 tablet twice a week maximum while trying to obtain records.  6 weeks worth provided.    HTN (hypertension) S: confirmed taking Amlodipine , carvedilol 6.25 mg BID,  imdur  daily with med review today. Poor control despite this with CAD history BP Readings from Last 3 Encounters:  05/23/15 148/82  05/16/15 180/98  01/31/15 143/86  A/P: want to start lisinopril but will get bmet to assess cr and potassium (on supplement before starting and may have to adjust down or stop). Will likely have 2 week follow up after starting lisinopril   GERD  (gastroesophageal reflux disease) S:ran out of nexium 2 days ago, yesterday felt worsening symptoms A/P: restart nexium due to poor control   Overactive bladder S:ran out of oxybutynin 2 weeks ago, having worsening problems- needing to rush to the bathroom, incontinence at times. Urine pads get soaked at times.  A/P: restart oxybutynin due to poor control   likely 2 week f/u-will base off labs and start lisinopril  Orders Placed This Encounter  Procedures  . Basic metabolic panel    [place]    Meds ordered this encounter  Medications  . esomeprazole (NEXIUM) 40 MG capsule    Sig: Take 1 capsule (40 mg total) by mouth daily.    Dispense:  90 capsule    Refill:  3  . oxybutynin (DITROPAN-XL) 5 MG 24 hr tablet    Sig: Take 1 tablet (5 mg total) by mouth daily.    Dispense:  90 tablet    Refill:  3  . HYDROcodone-acetaminophen (NORCO/VICODIN) 5-325 MG per tablet    Sig: Take 0.5 tablets by mouth every 6 (six) hours as needed (twice a week maximum for migraine headaches).    Dispense:  6 tablet    Refill:  0

## 2015-05-23 NOTE — Assessment & Plan Note (Signed)
S: confirmed taking Amlodipine , carvedilol 6.25 mg BID,  imdur  daily with med review today. Poor control despite this with CAD history BP Readings from Last 3 Encounters:  05/23/15 148/82  05/16/15 180/98  01/31/15 143/86  A/P: want to start lisinopril but will get bmet to assess cr and potassium (on supplement before starting and may have to adjust down or stop). Will likely have 2 week follow up after starting lisinopril

## 2015-05-23 NOTE — Assessment & Plan Note (Signed)
S: continues to have headaches and has had some severe headaches with nothing helping as no longer has tramadol or vicodin. She did remember name of headache doctor and will request ROI at front desk.  A/P: cant use tramadol due to seizures, already on prophylaxis with beta blocker and amlodipine, triptans cant be used due to CAD. Options very limited. We ultimately agreed to half of a vicodin 5/325 tablet twice a week maximum while trying to obtain records.  6 weeks worth provided.

## 2015-05-27 ENCOUNTER — Telehealth: Payer: Self-pay

## 2015-05-27 DIAGNOSIS — Z780 Asymptomatic menopausal state: Secondary | ICD-10-CM

## 2015-05-27 MED ORDER — LISINOPRIL 20 MG PO TABS: 20.0000 mg | ORAL_TABLET | Freq: Every day | ORAL | Status: DC

## 2015-05-27 NOTE — Telephone Encounter (Signed)
Bone density

## 2015-05-27 NOTE — Addendum Note (Signed)
Addended by: Lieutenant Diego A on: 05/27/2015 01:23 PM   Modules accepted: Orders

## 2015-05-28 ENCOUNTER — Inpatient Hospital Stay: Admission: RE | Admit: 2015-05-28 | Payer: Medicare Other | Source: Ambulatory Visit

## 2015-06-19 NOTE — Telephone Encounter (Signed)
Pt has been seen 

## 2015-06-19 NOTE — Telephone Encounter (Signed)
Ardine Bjork, can you close this for me? It will not let me!  Thanks!

## 2015-06-20 MED ORDER — CARVEDILOL 6.25 MG PO TABS: 6.2500 mg | ORAL_TABLET | Freq: Two times a day (BID) | ORAL | Status: DC

## 2015-07-26 ENCOUNTER — Telehealth: Payer: Self-pay | Admitting: Family Medicine

## 2015-07-26 NOTE — Telephone Encounter (Signed)
Called and spoke to pharmacy. Patient has 5 refills available. Patient called and made aware. Will have to change instructions and make pharmacy aware to note Dr. Erasmo Leventhal request for her to take 120 mg (2 tablets) opposed to the current written 60 mg (1 tablet)

## 2015-07-26 NOTE — Telephone Encounter (Signed)
Patient sstates she needs refills on two medications due to Dr. Durene Cal switching her dose from 1 tablet/capsule to 2 tablets/capsules per day:  Esomeprazole Mag Dr 40 mg capsule  isosorbide mononitrate (IMDUR) 60 MG 24 hr tablet       [facility] - [place], [place] - [address] [phone] (Phone) [phone] (Fax)

## 2015-08-28 ENCOUNTER — Other Ambulatory Visit: Payer: Self-pay | Admitting: Family Medicine

## 2015-10-02 ENCOUNTER — Telehealth: Payer: Self-pay | Admitting: Family Medicine

## 2015-10-02 NOTE — Telephone Encounter (Signed)
Pt said she would like to have new brand nexium 40 mg  #60 w/refills and that she takes med twice a day . [facility] [address]

## 2015-10-03 MED ORDER — ESOMEPRAZOLE MAGNESIUM 40 MG PO CPDR: 40.0000 mg | DELAYED_RELEASE_CAPSULE | Freq: Two times a day (BID) | ORAL | Status: DC

## 2015-10-03 NOTE — Telephone Encounter (Signed)
Medication refilled

## 2015-10-11 ENCOUNTER — Encounter: Payer: Self-pay | Admitting: Family Medicine

## 2015-10-11 ENCOUNTER — Ambulatory Visit (INDEPENDENT_AMBULATORY_CARE_PROVIDER_SITE_OTHER): Payer: Medicare Other | Admitting: Family Medicine

## 2015-10-11 VITALS — BP 138/72 | HR 68 | Temp 99.5°F | Wt 149.0 lb

## 2015-10-11 DIAGNOSIS — M25511 Pain in right shoulder: Secondary | ICD-10-CM | POA: Diagnosis not present

## 2015-10-11 DIAGNOSIS — G43109 Migraine with aura, not intractable, without status migrainosus: Secondary | ICD-10-CM

## 2015-10-11 DIAGNOSIS — Z23 Encounter for immunization: Secondary | ICD-10-CM | POA: Diagnosis not present

## 2015-10-11 MED ORDER — HYDROCODONE-ACETAMINOPHEN 5-325 MG PO TABS: 0.5000 | ORAL_TABLET | Freq: Four times a day (QID) | ORAL | Status: DC | PRN

## 2015-10-11 MED ORDER — PREDNISONE 20 MG PO TABS: ORAL_TABLET | ORAL | Status: DC

## 2015-10-11 NOTE — Patient Instructions (Addendum)
Flu and [id number] given today.  Shoulder pain appears to be rotator cuff. Prednisone for 7 days. Start exercises on Monday. Call me if not at least 50% better in 3 weeks and I can refer you to Dr. Cleophas DunkerWhitfield  Sign release of information at the front desk for records from your 2 prior providers so I can review headache history. I did provide #10 of the vicodin to be used sparingly

## 2015-10-11 NOTE — Progress Notes (Signed)
Tana Conch, MD  Subjective:  [name] is a 69 y.o. year old very pleasant female patient who presents for/with See problem oriented charting ROS- No chest pain or shortness of breath. No double vision or blurry vision.   Past Medical History-  Patient Active Problem List   Diagnosis Date Noted  . Migraine 09/19/2014    Priority: High  . CAD (coronary artery disease)     Priority: High  . Seizures (HCC)     Priority: High  . Hyperglycemia 09/19/2014    Priority: Medium  . Hypercholesteremia     Priority: Medium  . HTN (hypertension) 10/01/2011    Priority: Medium  . Overactive bladder 09/19/2014    Priority: Low  . Hypokalemia 06/01/2014    Priority: Low  . Arthritis     Priority: Low  . GERD (gastroesophageal reflux disease) 10/04/2011    Priority: Low    Medications- reviewed and updated Current Outpatient Prescriptions  Medication Sig Dispense Refill  . ACAI PO Take 1 capsule by mouth daily.    . Aloe Vera 25 MG CAPS Take 1 capsule by mouth daily.    Marland Kitchen amLODipine (NORVASC) 10 MG tablet Take 1 tablet (10 mg total) by mouth daily. 30 tablet 5  . aspirin EC 81 MG EC tablet Take 1 tablet (81 mg total) by mouth daily.    Marland Kitchen atorvastatin (LIPITOR) 20 MG tablet Take 1 tablet (20 mg total) by mouth daily. 30 tablet 5  . Bee Pollen 550 MG CAPS Take 1 capsule by mouth daily.    . carvedilol (COREG) 6.25 MG tablet Take 1 tablet (6.25 mg total) by mouth 2 (two) times daily with a meal. 60 tablet 6  . cholecalciferol (VITAMIN D) 400 UNITS TABS tablet Take 400 Units by mouth daily.    . Cyanocobalamin (VITAMIN B 12 PO) Take 1 capsule by mouth daily.    Marland Kitchen esomeprazole (NEXIUM) 40 MG capsule Take 1 capsule (40 mg total) by mouth 2 (two) times daily before a meal. 60 capsule 3  . ezetimibe (ZETIA) 10 MG tablet Take 1 tablet (10 mg total) by mouth daily. 30 tablet 5  . isosorbide mononitrate (IMDUR) 60 MG 24 hr tablet Take 2 tablets (120 mg total) by mouth daily. 60 tablet 5  .  lisinopril (PRINIVIL,ZESTRIL) 20 MG tablet take 1 tablet by mouth once daily 30 tablet 5  . oxybutynin (DITROPAN-XL) 5 MG 24 hr tablet Take 1 tablet (5 mg total) by mouth daily. 90 tablet 3  . potassium chloride (MICRO-K) 10 MEQ CR capsule Take 1 capsule (10 mEq total) by mouth 2 (two) times daily. 60 capsule 5  . venlafaxine XR (EFFEXOR-XR) 150 MG 24 hr capsule Take 1 capsule (150 mg total) by mouth at bedtime. 30 capsule 5  . vitamin C (ASCORBIC ACID) 500 MG tablet Take 500 mg by mouth daily.    . vitamin E (VITAMIN E) 1000 UNIT capsule Take 1,000 Units by mouth daily.    Marland Kitchen acetaminophen (TYLENOL) 500 MG tablet Take 1,000 mg by mouth every 6 (six) hours as needed. Reported on 10/11/2015    . HYDROcodone-acetaminophen (NORCO/VICODIN) 5-325 MG tablet Take 0.5 tablets by mouth every 6 (six) hours as needed (twice a week maximum for migraine headaches). 10 tablet 0  . nitroGLYCERIN (NITROSTAT) 0.4 MG SL tablet Place 1 tablet (0.4 mg total) under the tongue every 5 (five) minutes x 3 doses as needed for chest pain. (Patient not taking: Reported on 10/11/2015) 25 tablet 12  .  predniSONE (DELTASONE) 20 MG tablet Take 2 pills for 3 days, 1 pill for 4 days 10 tablet 0   No current facility-administered medications for this visit.    Objective: BP 138/72 mmHg  Pulse 68  Temp(Src) 99.5 F (37.5 C)  Wt 149 lb (67.586 kg) Gen: NAD, resting comfortably CV: RRR no murmurs rubs or gallops Lungs: CTAB no crackles, wheeze, rhonchi Abdomen: soft/nontender/nondistended/normal bowel sounds. No rebound or guarding.  Ext: no edema Skin: warm, dry Neuro: CN II-XII intact, sensation and reflexes normal throughout, 5/5 muscle strength in bilateral upper and lower extremities. Normal finger to nose. Normal rapid alternating movements. No pronator drift. Normal romberg. Normal gait.   Right Shoulder: Inspection reveals no abnormalities, atrophy or asymmetry. Palpation is normal with no tenderness over AC joint  or bicipital groove. ROM is limited by pain to 120 degrees forward flexion and 90 degrees abduction though can get full ROM with assist.  Rotator cuff strength normal throughout except limited by pain Signs of impingement with positive Neer and Hawkin's tests, empty can. painful arc but no drop arm sign.  Assessment/Plan:  R shoulder pain S:Off and on for years with issues starting about 3 years after an injury. Surgery for it in 2003- rotator cuff. Dr. Cleophas DunkerWhitfield with [facility] who has also done knee replacement. Bothering for at least 3 months but progressively worsening. Sometimes struggles to lift the arm. At times does hurt into the neck slightly. Not worsening with head position. Has taken intermittent tylenol and ibuprofen but has moderate to severe pain at times A/P: offered injection- patient declines. Wants to try oral medication prednisone 7 days. If fails, refer to Dr. Cleophas DunkerWhitfield who did surgery and has seen her previously  Migraine S: continues to have debilitating headaches. No change in her pattern- only difference is does not have high strength vicodin to take frequently. She states the 1/2 tab of vicodin did not help at all and took full tab and did not help either so took 2 tabs which resolved symptoms. Brought name of prior headache clinic on card today and will take to front desk A/P: request ROI again today. Told patient to use medicine as prescribed- 1/2 tab twice a week of vicodin for more severe headaches. #10 given. Discussed potential referral to headache clinic as appears saw general neurology before.     Return precautions advised.   Orders Placed This Encounter  Procedures  . Pneumococcal conjugate vaccine 13-valent  . Flu Vaccine QUAD 36+ mos IM    Meds ordered this encounter  Medications  . HYDROcodone-acetaminophen (NORCO/VICODIN) 5-325 MG tablet    Sig: Take 0.5 tablets by mouth every 6 (six) hours as needed (twice a week maximum for migraine  headaches).    Dispense:  10 tablet    Refill:  0  . predniSONE (DELTASONE) 20 MG tablet    Sig: Take 2 pills for 3 days, 1 pill for 4 days    Dispense:  10 tablet    Refill:  0

## 2015-10-11 NOTE — Assessment & Plan Note (Signed)
S: continues to have debilitating headaches. No change in her pattern- only difference is does not have high strength vicodin to take frequently. She states the 1/2 tab of vicodin did not help at all and took full tab and did not help either so took 2 tabs which resolved symptoms. Brought name of prior headache clinic on card today and will take to front desk A/P: request ROI again today. Told patient to use medicine as prescribed- 1/2 tab twice a week of vicodin for more severe headaches. #10 given. Discussed potential referral to headache clinic as appears saw general neurology before.

## 2015-11-25 ENCOUNTER — Other Ambulatory Visit: Payer: Self-pay | Admitting: Family Medicine

## 2015-12-27 ENCOUNTER — Other Ambulatory Visit: Payer: Self-pay

## 2015-12-27 MED ORDER — POTASSIUM CHLORIDE ER 10 MEQ PO CPCR: 10.0000 meq | ORAL_CAPSULE | Freq: Two times a day (BID) | ORAL | Status: DC

## 2015-12-27 NOTE — Telephone Encounter (Signed)
Patient has requested refill on Potassium Chloride - ok to refill?

## 2016-01-06 ENCOUNTER — Other Ambulatory Visit: Payer: Self-pay | Admitting: *Deleted

## 2016-01-06 MED ORDER — ISOSORBIDE MONONITRATE ER 60 MG PO TB24: 120.0000 mg | ORAL_TABLET | Freq: Every day | ORAL | Status: DC

## 2016-01-27 ENCOUNTER — Other Ambulatory Visit: Payer: Self-pay

## 2016-01-27 MED ORDER — CARVEDILOL 6.25 MG PO TABS: 6.2500 mg | ORAL_TABLET | Freq: Two times a day (BID) | ORAL | Status: DC

## 2016-02-26 ENCOUNTER — Other Ambulatory Visit: Payer: Self-pay | Admitting: Family Medicine

## 2016-02-26 MED ORDER — LISINOPRIL 20 MG PO TABS
20.0000 mg | ORAL_TABLET | Freq: Every day | ORAL | Status: DC
Start: 2016-02-26 — End: 2016-09-29

## 2016-03-10 ENCOUNTER — Other Ambulatory Visit: Payer: Self-pay | Admitting: *Deleted

## 2016-03-10 ENCOUNTER — Emergency Department (HOSPITAL_COMMUNITY): Payer: Medicare Other

## 2016-03-10 ENCOUNTER — Emergency Department (HOSPITAL_COMMUNITY)
Admission: EM | Admit: 2016-03-10 | Discharge: 2016-03-11 | Disposition: A | Discharge status: Home / Self Care | Payer: Medicare Other | Attending: Emergency Medicine | Admitting: Emergency Medicine

## 2016-03-10 ENCOUNTER — Encounter (HOSPITAL_COMMUNITY): Payer: Self-pay | Admitting: Nurse Practitioner

## 2016-03-10 DIAGNOSIS — Z7982 Long term (current) use of aspirin: Secondary | ICD-10-CM | POA: Insufficient documentation

## 2016-03-10 DIAGNOSIS — G8929 Other chronic pain: Secondary | ICD-10-CM | POA: Diagnosis not present

## 2016-03-10 DIAGNOSIS — R079 Chest pain, unspecified: Secondary | ICD-10-CM | POA: Insufficient documentation

## 2016-03-10 DIAGNOSIS — M1990 Unspecified osteoarthritis, unspecified site: Secondary | ICD-10-CM | POA: Diagnosis not present

## 2016-03-10 DIAGNOSIS — Z9861 Coronary angioplasty status: Secondary | ICD-10-CM | POA: Diagnosis not present

## 2016-03-10 DIAGNOSIS — K219 Gastro-esophageal reflux disease without esophagitis: Secondary | ICD-10-CM | POA: Diagnosis not present

## 2016-03-10 DIAGNOSIS — E7800 Pure hypercholesterolemia, unspecified: Secondary | ICD-10-CM | POA: Insufficient documentation

## 2016-03-10 DIAGNOSIS — N289 Disorder of kidney and ureter, unspecified: Secondary | ICD-10-CM | POA: Insufficient documentation

## 2016-03-10 DIAGNOSIS — Z9889 Other specified postprocedural states: Secondary | ICD-10-CM | POA: Insufficient documentation

## 2016-03-10 DIAGNOSIS — Z79899 Other long term (current) drug therapy: Secondary | ICD-10-CM | POA: Diagnosis not present

## 2016-03-10 DIAGNOSIS — Z87440 Personal history of urinary (tract) infections: Secondary | ICD-10-CM | POA: Insufficient documentation

## 2016-03-10 DIAGNOSIS — I10 Essential (primary) hypertension: Secondary | ICD-10-CM | POA: Diagnosis not present

## 2016-03-10 DIAGNOSIS — I2510 Atherosclerotic heart disease of native coronary artery without angina pectoris: Secondary | ICD-10-CM | POA: Diagnosis not present

## 2016-03-10 LAB — BASIC METABOLIC PANEL
Anion gap: 9 (ref 5–15)
BUN: 15 mg/dL (ref 6–20)
CALCIUM: 9.9 mg/dL (ref 8.9–10.3)
CHLORIDE: 105 mmol/L (ref 101–111)
CO2: 26 mmol/L (ref 22–32)
CREATININE: 1.41 mg/dL — AB (ref 0.44–1.00)
GFR calc Af Amer: 43 mL/min — ABNORMAL LOW (ref >60)
GFR calc non Af Amer: 37 mL/min — ABNORMAL LOW (ref >60)
GLUCOSE: 98 mg/dL (ref 65–99)
Potassium: 3.8 mmol/L (ref 3.5–5.1)
Sodium: 140 mmol/L (ref 135–145)

## 2016-03-10 LAB — I-STAT TROPONIN, ED
Troponin i, poc: 0.01 ng/mL (ref 0.00–0.08)
Troponin i, poc: 0.01 ng/mL (ref 0.00–0.08)

## 2016-03-10 LAB — CBC
HCT: 38.2 % (ref 36.0–46.0)
Hemoglobin: 12.4 g/dL (ref 12.0–15.0)
MCH: 30.9 pg (ref 26.0–34.0)
MCHC: 32.5 g/dL (ref 30.0–36.0)
MCV: 95.3 fL (ref 78.0–100.0)
Platelets: 237 10*3/uL (ref 150–400)
RBC: 4.01 MIL/uL (ref 3.87–5.11)
RDW: 14.1 % (ref 11.5–15.5)
WBC: 4.3 10*3/uL (ref 4.0–10.5)

## 2016-03-10 MED ORDER — ATORVASTATIN CALCIUM 20 MG PO TABS: 20.0000 mg | ORAL_TABLET | Freq: Every day | ORAL | Status: DC

## 2016-03-10 NOTE — ED Notes (Signed)
Pt reports several episodes of chest pain and sob throughout the day today.  No noted activity at onset of symptoms. She took nitro with some relief. She is alert and breathing easily.

## 2016-03-10 NOTE — ED Provider Notes (Signed)
CSN: [id number]     Arrival date & time 03/10/16  1834 History   First MD Initiated Contact with Patient 03/10/16 2314     Chief Complaint  Patient presents with  . Chest Pain     (Consider location/radiation/quality/duration/timing/severity/associated sxs/prior Treatment) HPI Comments: Patient reports intermittent episodes of chest pain  Normal takes nitro with relief will take nitron 1-4 times daily than not need medications for weeks at a time.  Saw PCP recently no change in medications told to see cardiologist-- Dr. Katrinka BlazingSmith, who she saw about 1 year ago.  Noticed for the past 2 weeks that she has been having L arm pain as well. Denies Nausea, SOB, dizziness  Patient is a 70 y.o. female presenting with chest pain. The history is provided by the patient.  Chest Pain Pain location:  Substernal area and L chest Pain quality: pressure   Pain radiates to:  L arm Pain radiates to the back: no   Pain severity:  Moderate Onset quality:  Unable to specify Duration:  2 weeks Timing:  Intermittent Progression:  Worsening Chronicity:  Recurrent Context: at rest   Context: not breathing, not lifting, no movement, not raising an arm and no trauma   Relieved by:  Nitroglycerin Worsened by:  Nothing tried Associated symptoms: no back pain, no cough, no dizziness, no fatigue, no fever, no heartburn, no nausea, no palpitations and no shortness of breath   Risk factors: coronary artery disease, high cholesterol and hypertension   Risk factors: not obese, no prior DVT/PE and no smoking     Past Medical History  Diagnosis Date  . Hypercholesteremia   . Hypertension   . Coronary artery disease     a. 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 non obs dz [facility] 2007. c. Non obs [facility]-mod[facility].   Marland Kitchen. GERD (gastroesophageal reflux disease)   . Diverticulitis     a. Hx microperf 2012 - hospitalizated.  Marland Kitchen. UTI (lower urinary tract infection)   . Cluster headache   . Seizures (HCC)   . Arthritis   .  Chronic lower back pain   . PVC's (premature ventricular contractions)     a. Hx of trigeminy/bigeminy.   Past Surgical History  Procedure Laterality Date  . Joint replacement      right knee  . Vascular surgery    . Coronary angioplasty with stent placement  1995    "1"  . Cardiac catheterization  05/17/2014  . Total knee arthroplasty Right 2005  . Abdominal hysterectomy  1987    "partial"-still has ovaries  . Tubal ligation  1970  . Left heart catheterization with coronary angiogram N/A 05/17/2014    Procedure: LEFT HEART CATHETERIZATION WITH CORONARY ANGIOGRAM;  Surgeon: Marykay Lexavid W Harding, MD;  [facility];  Service: Cardiovascular;  Laterality: N/A;   Family History  Problem Relation Age of Onset  . CAD Brother   . CAD Father   . Diabetes Mother     father, sister, brothers   Social History  Substance Use Topics  . Smoking status: Never Smoker   . Smokeless tobacco: Never Used  . Alcohol Use: No   OB History    No data available     Review of Systems  Constitutional: Negative for fever, chills and fatigue.  Respiratory: Negative for cough, chest tightness and shortness of breath.   Cardiovascular: Positive for chest pain. Negative for palpitations.  Gastrointestinal: Negative for heartburn and nausea.  Genitourinary: Positive for frequency. Negative  for dysuria.  Musculoskeletal: Negative for back pain.  Neurological: Negative for dizziness.  All other systems reviewed and are negative.     Allergies  Sulfa drugs cross reactors  Home Medications   Prior to Admission medications   Medication Sig Start Date End Date Taking? Authorizing Provider  ACAI PO Take 1 capsule by mouth daily.   Yes Historical Provider, MD  acetaminophen (TYLENOL) 500 MG tablet Take 1,000 mg by mouth every 6 (six) hours as needed. Reported on 10/11/2015   Yes Historical Provider, MD  Aloe Vera 25 MG CAPS Take 1 capsule by mouth daily.   Yes Historical Provider, MD  amLODipine  (NORVASC) 10 MG tablet take 1 tablet by mouth once daily 11/25/15  Yes Shelva Majestic, MD  aspirin EC 81 MG EC tablet Take 1 tablet (81 mg total) by mouth daily. 05/18/14  Yes Janetta Hora, PA-C  atorvastatin (LIPITOR) 20 MG tablet Take 1 tablet (20 mg total) by mouth daily. 03/10/16  Yes Shelva Majestic, MD  Bee Pollen 550 MG CAPS Take 1 capsule by mouth daily.   Yes Historical Provider, MD  carvedilol (COREG) 6.25 MG tablet Take 1 tablet (6.25 mg total) by mouth 2 (two) times daily with a meal. 01/27/16  Yes Shelva Majestic, MD  cholecalciferol (VITAMIN D) 400 UNITS TABS tablet Take 400 Units by mouth daily.   Yes Historical Provider, MD  Cyanocobalamin (VITAMIN B 12 PO) Take 1 capsule by mouth daily.   Yes Historical Provider, MD  esomeprazole (NEXIUM) 40 MG capsule Take 1 capsule (40 mg total) by mouth 2 (two) times daily before a meal. 10/03/15  Yes Shelva Majestic, MD  HYDROcodone-acetaminophen (NORCO/VICODIN) 5-325 MG tablet Take 0.5 tablets by mouth every 6 (six) hours as needed (twice a week maximum for migraine headaches). 10/11/15  Yes Shelva Majestic, MD  isosorbide mononitrate (IMDUR) 60 MG 24 hr tablet Take 2 tablets (120 mg total) by mouth daily. 01/06/16  Yes Shelva Majestic, MD  lisinopril (PRINIVIL,ZESTRIL) 20 MG tablet Take 1 tablet (20 mg total) by mouth daily. 02/26/16  Yes Shelva Majestic, MD  nitroGLYCERIN (NITROSTAT) 0.4 MG SL tablet Place 1 tablet (0.4 mg total) under the tongue every 5 (five) minutes x 3 doses as needed for chest pain. 05/16/15  Yes Shelva Majestic, MD  oxybutynin (DITROPAN-XL) 5 MG 24 hr tablet Take 1 tablet (5 mg total) by mouth daily. 05/23/15  Yes Shelva Majestic, MD  potassium chloride (MICRO-K) 10 MEQ CR capsule Take 1 capsule (10 mEq total) by mouth 2 (two) times daily. 12/27/15  Yes Shelva Majestic, MD  traMADol (ULTRAM) 50 MG tablet Take 50 mg by mouth every 6 (six) hours as needed for moderate pain.   Yes Historical Provider, MD  venlafaxine XR  (EFFEXOR-XR) 150 MG 24 hr capsule take 1 capsule by mouth at bedtime 11/25/15  Yes Shelva Majestic, MD  vitamin C (ASCORBIC ACID) 500 MG tablet Take 500 mg by mouth daily.   Yes Historical Provider, MD  vitamin E (VITAMIN E) 1000 UNIT capsule Take 1,000 Units by mouth daily.   Yes Historical Provider, MD  ZETIA 10 MG tablet take 1 tablet by mouth once daily 11/25/15  Yes Shelva Majestic, MD  predniSONE (DELTASONE) 20 MG tablet Take 2 pills for 3 days, 1 pill for 4 days 10/11/15   Shelva Majestic, MD   BP 143/83 mmHg  Pulse 71  Temp(Src) 98.5 F (36.9 C) (Oral)  Resp 19  SpO2 97% Physical Exam  Constitutional: She appears well-developed and well-nourished.  HENT:  Head: Normocephalic.  Eyes: Pupils are equal, round, and reactive to light.  Neck: Normal range of motion.  Cardiovascular: Normal rate and regular rhythm.   Pulmonary/Chest: Effort normal and breath sounds normal. She exhibits no tenderness.  Abdominal: Soft.  Musculoskeletal: Normal range of motion. She exhibits no edema or tenderness.  Neurological: She is alert.  Skin: Skin is warm and dry.  Nursing note and vitals reviewed.   ED Course  Procedures (including critical care time) Labs Review Labs Reviewed  BASIC METABOLIC PANEL - Abnormal; Notable for the following:    Creatinine, Ser 1.41 (*)    GFR calc non Af Amer 37 (*)    GFR calc Af Amer 43 (*)    All other components within normal limits  URINALYSIS, ROUTINE W REFLEX MICROSCOPIC (NOT AT [facility]) - Abnormal; Notable for the following:    Color, Urine AMBER (*)    APPearance CLOUDY (*)    Specific Gravity, Urine 1.033 (*)    Ketones, ur 15 (*)    All other components within normal limits  CBC  URINALYSIS, ROUTINE W REFLEX MICROSCOPIC (NOT AT [facility])  I-STAT TROPOININ, ED  Rosezena Sensor, ED    Imaging Review Dg Chest 2 View  03/10/2016  CLINICAL DATA:  Chest pain and shortness of breath for 1 day EXAM: CHEST  2 VIEW COMPARISON:  01/31/2015 FINDINGS:  The heart size and mediastinal contours are within normal limits. Both lungs are clear. The visualized skeletal structures are unremarkable. IMPRESSION: No active cardiopulmonary disease. Electronically Signed   By: Alcide Clever M.D.   On: 03/10/2016 20:13   I have personally reviewed and evaluated these images and lab results as part of my medical decision-making.   EKG Interpretation   Date/Time:  Wednesday Mar 11 2016 00:30:06 EDT Ventricular Rate:  71 PR Interval:  130 QRS Duration: 100 QT Interval:  385 QTC Calculation: 418 R Axis:   54 Text Interpretation:  Sinus rhythm Probable left atrial enlargement  Borderline repolarization abnormality No significant change since last  tracing Confirmed by Erroll Luna [phone]) on 03/11/2016 4:59:18 AM      MDM  Will repest Troponin, obtain UA as increase in creatinine  Patient has had 2 sets of negative cardiac markers.  She's had 2 EKGs which did not indicate any change.  ROM previous.  She does have an appointment with her urologist, nephrologist in 2 days.  I recommend that she make an appointment with her primary care physician and cardiologist as well.  I also recommend that she keep a written diary of any episodes of pain with associated symptoms. Final diagnoses:  Chest pain, unspecified chest pain type  Renal insufficiency         Earley Favor, NP 03/11/16 0008  Earley Favor, NP 03/11/16 6045  Tomasita Crumble, MD 03/11/16 4098

## 2016-03-11 LAB — URINALYSIS, ROUTINE W REFLEX MICROSCOPIC
Bilirubin Urine: NEGATIVE
GLUCOSE, UA: NEGATIVE mg/dL
HGB URINE DIPSTICK: NEGATIVE
Ketones, ur: 15 mg/dL — AB
LEUKOCYTES UA: NEGATIVE
Nitrite: NEGATIVE
PROTEIN: NEGATIVE mg/dL
SPECIFIC GRAVITY, URINE: 1.033 — AB (ref 1.005–1.030)
pH: 5.5 (ref 5.0–8.0)

## 2016-03-11 NOTE — Discharge Instructions (Signed)
Cardiac-Specific Troponin I and T Test WHY AM I HAVING THIS TEST? You may have this test if you have experienced chest pain. The test can be used to determine if you have had a heart attack or injury to heart (cardiac) muscle. This test can also help predict the possibility of future heart attacks. This test measures the concentration of cardiac-specific troponin in your blood. Troponins are proteins that help muscles contract. There are three forms of troponin, including troponins C, I, and T. The types of troponins I and T that are found in cardiac muscle are different from the troponins I and T that are found in skeletal muscle. Therefore, testing can be done for cardiac-specific troponins I and T. These types of troponin are normally present in very small quantities in the blood. When there is damage to heart muscle cells, cardiac troponins I and T are released into circulation. The more damage there is, the greater the concentration of troponins I and T. When a person has a heart attack, levels of troponin can become elevated in the blood within 3-4 hours after injury and may remain elevated for 10-14 days. WHAT KIND OF SAMPLE IS TAKEN? A blood sample is required for this test. It is usually collected by inserting a needle into a vein. Usually, an initial blood sample is collected, and then another blood sample is collected 12 hours later. After these samples, you will have your blood tested daily for 3-5 days. You might also have it tested weekly for 5-6 weeks. HOW DO I PREPARE FOR THE TEST? There is no preparation required for this test. However, be aware that you will need to make arrangements to have your blood collected frequently.  WHAT ARE THE REFERENCE RANGES? Reference values are considered healthy values established after testing a large group of healthy people. Reference values may vary among different people, labs, and hospitals. It is your responsibility to obtain your test results. Ask  the lab or department performing the test when and how you will get your results. Reference values for cardiac troponins are as follows:  Cardiac troponin T: less than 0.1 ng/mL.  Cardiac troponin I: less than 0.03 ng/mL. WHAT DO THE RESULTS MEAN? Troponin values above the reference values may indicate:  Injury to the heart muscle.  Heart attack. Talk with your health care provider to discuss your results, treatment options, and if necessary, the need for more tests. Talk with your health care provider if you have any questions about your results.   This information is not intended to replace advice given to you by your health care provider. Make sure you discuss any questions you have with your health care provider.   Document Released: 11/14/2004 Document Revised: 11/02/2014 Document Reviewed: 03/07/2014 Elsevier Interactive Patient Education [employer] Today you were evaluated for chest pain, you have a  had normal EKG and normal cardiac markers, which is reassuring that you're not having a heart attack your x-ray is normal as well with no indication of pneumonia or heart enlargement.  You do have renal insufficiency from lying to sitting you have an appointment with your renal, Dr. in several days.  I would like you to call and make an appointment with your cardiologist or primary care physician to be seen as soon as possible as well.  Please keep a written diary of episodes of chest pain and be very specific as to which you're doing at that time how long the pain lasts and any associated symptoms  such as shortness of breath, nausea, dizziness, or abdominal pain

## 2016-03-11 NOTE — ED Notes (Signed)
Patient verbalized understanding of discharge instructions and denies any further needs or questions at this time. VS stable. Patient ambulatory with steady gait.  

## 2016-04-06 ENCOUNTER — Other Ambulatory Visit: Payer: Self-pay | Admitting: Family Medicine

## 2016-04-06 ENCOUNTER — Encounter: Payer: Self-pay | Admitting: Family Medicine

## 2016-04-06 ENCOUNTER — Ambulatory Visit (INDEPENDENT_AMBULATORY_CARE_PROVIDER_SITE_OTHER): Payer: Medicare Other | Admitting: Family Medicine

## 2016-04-06 VITALS — BP 130/82 | HR 81 | Temp 98.0°F | Ht 66.0 in | Wt 153.0 lb

## 2016-04-06 DIAGNOSIS — M25512 Pain in left shoulder: Secondary | ICD-10-CM

## 2016-04-06 DIAGNOSIS — I25119 Atherosclerotic heart disease of native coronary artery with unspecified angina pectoris: Secondary | ICD-10-CM

## 2016-04-06 DIAGNOSIS — N179 Acute kidney failure, unspecified: Secondary | ICD-10-CM | POA: Diagnosis not present

## 2016-04-06 DIAGNOSIS — Z7289 Other problems related to lifestyle: Secondary | ICD-10-CM

## 2016-04-06 LAB — BASIC METABOLIC PANEL
BUN: 18 mg/dL (ref 6–23)
CO2: 29 meq/L (ref 19–32)
Calcium: 9.4 mg/dL (ref 8.4–10.5)
Chloride: 105 mEq/L (ref 96–112)
Creatinine, Ser: 1.01 mg/dL (ref 0.40–1.20)
GFR: 69.8 mL/min (ref >60.00)
GLUCOSE: 89 mg/dL (ref 70–99)
POTASSIUM: 4.2 meq/L (ref 3.5–5.1)
SODIUM: 139 meq/L (ref 135–145)

## 2016-04-06 MED ORDER — TRAMADOL HCL 50 MG PO TABS: 50.0000 mg | ORAL_TABLET | Freq: Four times a day (QID) | ORAL | Status: DC | PRN

## 2016-04-06 NOTE — Patient Instructions (Addendum)
I want you to call TODAY to get cardiology follow up. Had advised this previously- time to get around to this now.    Check kidney function before you leave- may need follow up tests depending on if went back to normal or not  We will call you within a week about your referral to Dr. Cleophas DunkerWhitfield for your left shoulder. If you do not hear within 2 weeks, give us a call.   Let's follow up in 3 months to reassess how you are doing. You are behind on the following: Health Maintenance Due  Topic Date Due  . Hepatitis C Screening  [birth year]  . MAMMOGRAM  10/29/1996  . COLONOSCOPY  10/29/1996  . TETANUS/TDAP  12/24/2005  . ZOSTAVAX  10/29/2006  . DEXA SCAN  10/30/2011

## 2016-04-06 NOTE — Progress Notes (Signed)
Pre visit review using our clinic review tool, if applicable. No additional management support is needed unless otherwise documented below in the visit note. 

## 2016-04-06 NOTE — Progress Notes (Signed)
Subjective:  [name] is a 70 y.o. year old very pleasant female patient who presents for/with See problem oriented charting ROS- see any ROS included in HPI as well.   Past Medical History-  Patient Active Problem List   Diagnosis Date Noted  . Migraine 09/19/2014    Priority: High  . CAD (coronary artery disease)     Priority: High  . Seizures (HCC)     Priority: High  . Hyperglycemia 09/19/2014    Priority: Medium  . Hypercholesteremia     Priority: Medium  . HTN (hypertension) 10/01/2011    Priority: Medium  . Overactive bladder 09/19/2014    Priority: Low  . Hypokalemia 06/01/2014    Priority: Low  . Arthritis     Priority: Low  . GERD (gastroesophageal reflux disease) 10/04/2011    Priority: Low    Medications- reviewed and updated Current Outpatient Prescriptions  Medication Sig Dispense Refill  . ACAI PO Take 1 capsule by mouth daily.    Marland Kitchen. acetaminophen (TYLENOL) 500 MG tablet Take 1,000 mg by mouth every 6 (six) hours as needed. Reported on 10/11/2015    . Aloe Vera 25 MG CAPS Take 1 capsule by mouth daily.    Marland Kitchen. amLODipine (NORVASC) 10 MG tablet take 1 tablet by mouth once daily 30 tablet 5  . aspirin EC 81 MG EC tablet Take 1 tablet (81 mg total) by mouth daily.    Marland Kitchen. atorvastatin (LIPITOR) 20 MG tablet Take 1 tablet (20 mg total) by mouth daily. 90 tablet 0  . Bee Pollen 550 MG CAPS Take 1 capsule by mouth daily.    . carvedilol (COREG) 6.25 MG tablet Take 1 tablet (6.25 mg total) by mouth 2 (two) times daily with a meal. 60 tablet 5  . cholecalciferol (VITAMIN D) 400 UNITS TABS tablet Take 400 Units by mouth daily.    . Cyanocobalamin (VITAMIN B 12 PO) Take 1 capsule by mouth daily.    Marland Kitchen. esomeprazole (NEXIUM) 40 MG capsule Take 1 capsule (40 mg total) by mouth 2 (two) times daily before a meal. 60 capsule 3  . HYDROcodone-acetaminophen (NORCO/VICODIN) 5-325 MG tablet Take 0.5 tablets by mouth every 6 (six) hours as needed (twice a week maximum for migraine  headaches). 10 tablet 0  . isosorbide mononitrate (IMDUR) 60 MG 24 hr tablet Take 2 tablets (120 mg total) by mouth daily. 60 tablet 5  . lisinopril (PRINIVIL,ZESTRIL) 20 MG tablet Take 1 tablet (20 mg total) by mouth daily. 30 tablet 5  . nitroGLYCERIN (NITROSTAT) 0.4 MG SL tablet Place 1 tablet (0.4 mg total) under the tongue every 5 (five) minutes x 3 doses as needed for chest pain. 25 tablet 12  . oxybutynin (DITROPAN-XL) 5 MG 24 hr tablet Take 1 tablet (5 mg total) by mouth daily. 90 tablet 3  . potassium chloride (MICRO-K) 10 MEQ CR capsule Take 1 capsule (10 mEq total) by mouth 2 (two) times daily. 60 capsule 5  . predniSONE (DELTASONE) 20 MG tablet Take 2 pills for 3 days, 1 pill for 4 days 10 tablet 0  . traMADol (ULTRAM) 50 MG tablet Take 50 mg by mouth every 6 (six) hours as needed for moderate pain.    Marland Kitchen. venlafaxine XR (EFFEXOR-XR) 150 MG 24 hr capsule take 1 capsule by mouth at bedtime 30 capsule 5  . vitamin C (ASCORBIC ACID) 500 MG tablet Take 500 mg by mouth daily.    . vitamin E (VITAMIN E) 1000 UNIT capsule Take 1,000  Units by mouth daily.    Marland Kitchen ZETIA 10 MG tablet take 1 tablet by mouth once daily 30 tablet 5   No current facility-administered medications for this visit.    Objective: BP 130/82 mmHg  Pulse 81  Temp(Src) 98 F (36.7 C) (Oral)  Ht  (1.676 m)  Wt 153 lb (69.4 kg)  BMI 24.71 kg/m2  SpO2 96% Gen: NAD, resting comfortably CV: RRR no murmurs rubs or gallops Lungs: CTAB no crackles, wheeze, rhonchi Ext: no edema Skin: warm, dry, no rash Neuro: grossly normal, moves all extremities  Left Shoulder: Inspection reveals no abnormalities, atrophy or asymmetry. Palpation is normal with no tenderness over AC joint or bicipital groove. ROM is full in all planes. Rotator cuff strength normal throughout. Signs of impingement with positive Neer and Hawkin's tests, empty can.  Assessment/Plan:  Chest pain in patient with history of coronary artery disease   Left arm pain and numbness  Acute kidney injury  S: Patient was seen in the emergency room on 03/10/2016. She had noted intermittent episodes of chest pain/stable angina about once or twice a month but had been more frequent lately. Usually she would get relief with nitroglycerin. She went to the emergency room because along with her chest pain she also had left shoulder and arm pain with associated numbness that was not helped by nitroglycerin. In the emergency room patient had 2 sets of negative cardiac enzymes, she had 2 EKGs which did not show any change from previous. Patient was discharged with plan for PCP and cardiology follow-up. She was also noted to have a creatinine up to 1.4 from 0.8 previously. Records state that she has a urology and nephrology follow-up but patient does not have a urologist or nephrologist.   Today, patient notes that the numbness and tingling into her left shoulder and arm resolved. She does continue to have some left shoulder pain. She has several signs of impingement and I worry about bursitis potentially. Patient does admit she is using her nitroglycerin about once a week and it is effective. She states when she used it before she went to the emergency room it did not help. She previously has of July of this year told me she was using nitroglycerin 1-2 times a month.  ROS-no shortness of breath, nausea, dizziness. A/P: Given increased frequency of her chest pains, I have advised cardiology follow-up and she agrees to make today.not typically exertional but she has not had recent stress test noted and may reasonable to update this at minimum  In regards to her acute kidney injury, we checked labs today and her creatinine has falling back to 1. Review of records show over last few years creatinine and 0.8-1.1 range so this is essentially normal. No further follow-up is required.  For her left shoulder pain she is seen [facility] in the past with Dr.  Cleophas Dunker. She requests a repeat referral to their office and this was placed at this time. I do wonder if symptoms could be caused by bursitis.   Return in about 3 months (around 07/07/2016). Return precautions advised.   Orders Placed This Encounter  Procedures  . Basic metabolic panel      . Hepatitis C antibody, reflex    [facility]  . Ambulatory referral to Orthopedic Surgery    Referral Priority:  Routine    Referral Type:  Surgical    Referral Reason:  Specialty Services Required    Requested Specialty:  Orthopedic Surgery  Number of Visits Requested:  1    Meds ordered this encounter  Medications  . traMADol (ULTRAM) 50 MG tablet    Sig: Take 1 tablet (50 mg total) by mouth every 6 (six) hours as needed for moderate pain.    Dispense:  60 tablet    Refill:  2  refilled tramadol- using for shoulder, low back, headaches (migraines). Advised sparing use.   The duration of face-to-face time during this visit was 25 minutes. Greater than 50% of this time was spent in counseling, explanation of diagnosis, planning of further management, and/or coordination of care.     Tana Conch, MD

## 2016-04-07 LAB — HEPATITIS C ANTIBODY: HCV Ab: NEGATIVE

## 2016-05-24 ENCOUNTER — Other Ambulatory Visit: Payer: Self-pay | Admitting: Family Medicine

## 2016-05-30 ENCOUNTER — Other Ambulatory Visit: Payer: Self-pay | Admitting: Family Medicine

## 2016-06-04 ENCOUNTER — Other Ambulatory Visit: Payer: Self-pay | Admitting: Family Medicine

## 2016-06-04 ENCOUNTER — Telehealth: Payer: Self-pay | Admitting: *Deleted

## 2016-06-04 NOTE — Telephone Encounter (Signed)
Rx refill sent to pharmacy. 

## 2016-06-05 NOTE — Telephone Encounter (Signed)
From last note- "I want you to call TODAY to get cardiology follow up. Had advised this previously- time to get around to this now. "  Katherine MuirJamie- can we make sure she gets an appointment and follows through. thanks

## 2016-06-06 ENCOUNTER — Other Ambulatory Visit: Payer: Self-pay | Admitting: Family Medicine

## 2016-06-09 ENCOUNTER — Other Ambulatory Visit: Payer: Self-pay

## 2016-06-09 DIAGNOSIS — I10 Essential (primary) hypertension: Secondary | ICD-10-CM

## 2016-06-09 NOTE — Telephone Encounter (Signed)
Referral placed for Cardiology. It has been almost 3 years since patient was seen by Cardiology.

## 2016-06-27 ENCOUNTER — Other Ambulatory Visit: Payer: Self-pay | Admitting: Family Medicine

## 2016-07-31 ENCOUNTER — Other Ambulatory Visit: Payer: Self-pay | Admitting: Family Medicine

## 2016-08-02 ENCOUNTER — Other Ambulatory Visit: Payer: Self-pay | Admitting: Family Medicine

## 2016-08-07 ENCOUNTER — Telehealth: Payer: Self-pay

## 2016-08-07 NOTE — Telephone Encounter (Signed)
Received PA request from [facility] for Potassium capsules. PA submitted & is pending. Key: [id number]

## 2016-08-10 NOTE — Telephone Encounter (Signed)
Pt has to be unable to use Klor-con, potassium chloride, or potassium chloride ER tablet in order for the potassium chloride 10 MEQ CR capsules to be approved.

## 2016-08-11 ENCOUNTER — Other Ambulatory Visit: Payer: Self-pay

## 2016-08-11 ENCOUNTER — Other Ambulatory Visit: Payer: Self-pay | Admitting: Family Medicine

## 2016-08-11 MED ORDER — POTASSIUM CHLORIDE ER 10 MEQ PO TBCR: 10.0000 meq | EXTENDED_RELEASE_TABLET | Freq: Two times a day (BID) | ORAL | 5 refills | Status: DC

## 2016-08-11 NOTE — Telephone Encounter (Signed)
Prescription sent to pharmacy.

## 2016-08-12 NOTE — Telephone Encounter (Signed)
Needs office visit- want to make sure I discuss risks with her given seizure history plus we had said 3 month follow up in June and has not seen cardiology either as planned

## 2016-08-13 NOTE — Telephone Encounter (Signed)
We shredded this- patient to come in for visit

## 2016-08-17 ENCOUNTER — Encounter: Payer: Self-pay | Admitting: Family Medicine

## 2016-08-17 ENCOUNTER — Ambulatory Visit (INDEPENDENT_AMBULATORY_CARE_PROVIDER_SITE_OTHER): Payer: Self-pay | Admitting: Family Medicine

## 2016-08-17 VITALS — BP 144/84 | HR 74 | Temp 98.2°F | Wt 152.8 lb

## 2016-08-17 DIAGNOSIS — G43109 Migraine with aura, not intractable, without status migrainosus: Secondary | ICD-10-CM

## 2016-08-17 DIAGNOSIS — R32 Unspecified urinary incontinence: Secondary | ICD-10-CM

## 2016-08-17 DIAGNOSIS — I10 Essential (primary) hypertension: Secondary | ICD-10-CM

## 2016-08-17 DIAGNOSIS — Z23 Encounter for immunization: Secondary | ICD-10-CM

## 2016-08-17 MED ORDER — TRAMADOL HCL 50 MG PO TABS: ORAL_TABLET | ORAL | 0 refills | Status: DC

## 2016-08-17 NOTE — Patient Instructions (Signed)
We will call you within a week about your referral to neurology for headache follow up. If you do not hear within 2 weeks, give us a call.   I am very concerned about using tramadol with your seizure history but you have had no issues for several years so agreed to refill once a day until neurology follow up  You have to follow up with cardiology  See me in 3 months

## 2016-08-17 NOTE — Progress Notes (Signed)
Pre visit review using our clinic review tool, if applicable. No additional management support is needed unless otherwise documented below in the visit note. 

## 2016-08-17 NOTE — Progress Notes (Signed)
Subjective:  [name] is a 70 y.o. year old very pleasant female patient who presents for/with See problem oriented charting ROS- No facial or extremity weakness. No slurred words or trouble swallowing. no blurry vision or double vision. No paresthesias. No confusion or word finding difficulties. Does have daily headaches.see any ROS included in HPI as well.   Past Medical History-  Patient Active Problem List   Diagnosis Date Noted  . Migraine 09/19/2014    Priority: High  . CAD (coronary artery disease)     Priority: High  . Seizures (HCC)     Priority: High  . Hyperglycemia 09/19/2014    Priority: Medium  . Hypercholesteremia     Priority: Medium  . HTN (hypertension) 10/01/2011    Priority: Medium  . Overactive bladder 09/19/2014    Priority: Low  . Hypokalemia 06/01/2014    Priority: Low  . Arthritis     Priority: Low  . GERD (gastroesophageal reflux disease) 10/04/2011    Priority: Low    Medications- reviewed and updated Current Outpatient Prescriptions  Medication Sig Dispense Refill  . ACAI PO Take 1 capsule by mouth daily.    Marland Kitchen. acetaminophen (TYLENOL) 500 MG tablet Take 1,000 mg by mouth every 6 (six) hours as needed. Reported on 10/11/2015    . Aloe Vera 25 MG CAPS Take 1 capsule by mouth daily.    Marland Kitchen. amLODipine (NORVASC) 10 MG tablet take 1 tablet by mouth once daily 30 tablet 5  . aspirin EC 81 MG EC tablet Take 1 tablet (81 mg total) by mouth daily.    Marland Kitchen. atorvastatin (LIPITOR) 20 MG tablet Take 1 tablet (20 mg total) by mouth daily. 90 tablet 0  . Bee Pollen 550 MG CAPS Take 1 capsule by mouth daily.    . carvedilol (COREG) 6.25 MG tablet take 1 tablet by mouth twice a day with meals 60 tablet 5  . cholecalciferol (VITAMIN D) 400 UNITS TABS tablet Take 400 Units by mouth daily.    . Cyanocobalamin (VITAMIN B 12 PO) Take 1 capsule by mouth daily.    Marland Kitchen. esomeprazole (NEXIUM) 40 MG capsule Take 1 capsule (40 mg total) by mouth 2 (two) times daily before a meal.  60 capsule 3  . isosorbide mononitrate (IMDUR) 60 MG 24 hr tablet take 2 tablets by mouth once daily 60 tablet 5  . lisinopril (PRINIVIL,ZESTRIL) 20 MG tablet Take 1 tablet (20 mg total) by mouth daily. 30 tablet 5  . nitroGLYCERIN (NITROSTAT) 0.4 MG SL tablet DISSOLVE 1 TABLET UNDER TONGUE EVERY 5 MINUTES X 3 DOSES AS NEEDED FOR CHEST PAIN 25 tablet 0  . oxybutynin (DITROPAN-XL) 5 MG 24 hr tablet take 1 tablet by mouth once daily 90 tablet 3  . potassium chloride (KLOR-CON 10) 10 MEQ tablet Take 1 tablet (10 mEq total) by mouth 2 (two) times daily. 60 tablet 5  . traMADol (ULTRAM) 50 MG tablet take 1 tablet by mouth up to once daily for migraines 90 tablet 0  . venlafaxine XR (EFFEXOR-XR) 150 MG 24 hr capsule take 1 capsule by mouth at bedtime 30 capsule 5  . vitamin C (ASCORBIC ACID) 500 MG tablet Take 500 mg by mouth daily.    . vitamin E (VITAMIN E) 1000 UNIT capsule Take 1,000 Units by mouth daily.    Marland Kitchen. ZETIA 10 MG tablet take 1 tablet by mouth once daily 30 tablet 5   No current facility-administered medications for this visit.     Objective: BP Marland Kitchen(!)  144/84 (BP Location: Left Arm, Patient Position: Sitting, Cuff Size: Normal)   Pulse 74   Temp 98.2 F (36.8 C) (Oral)   Wt 152 lb 12.8 oz (69.3 kg)   SpO2 95%   BMI 24.66 kg/m  Gen: NAD, resting comfortably, slightly disheveled CV: RRR no murmurs rubs or gallops Lungs: CTAB no crackles, wheeze, rhonchi Neuro: CN II-XII intact, sensation and reflexes normal throughout, 5/5 muscle strength in bilateral upper and lower extremities. Normal finger to nose. Normal rapid alternating movements. No pronator drift. Normal romberg. Normal gait.  Anxious appearing at times  Assessment/Plan:  CAD (coronary artery disease) Cardiology follow up advised in [place]. CP needing nitroglycerin about once a month. Not worsening- agrees to schedule today  Migraine S: Patient continues to have somewhat shifting story on prior neurologist. Today she  states she has seen a Dr. Anne Hahn but last over 20 years ago. She states diagnosed with tension, cluster, and migraine headaches- most recently has primarily had migraines per her report. On venlafaxine per her for prophylaxis. She had been on tramadol and vicodin as needed per prior PCP "Dr. Corrie Dandy" per patient. Avoiding triptans  with CAD. I had previously declined tramadol but last visit I did give to her as---History of seizures last over 20 years ago- numbness in right hand and then a few minutes later could not talk and then would wake up -- But states has never had a seizure on tramadol.  A/P:Patient states once a day tramadol alongwith tylenol has controlled hear headaches recently. She would really like to continue this regimen as has made daily life more livable. I discussed my concern about lowering seizure threshold. I agreed to a refill 1x if she will follow up with neurology- she wants to go back to [facility] to see if she can meeting with Dr. Lesia Sago who she thinks she saw previously. I would ask for their opinion on tramadol use and other treatments for migraines if advise against.   Will monitor BP as elevated today but controlled last visit- continue amlodipine 10mg , coreg 6.25mg  BID, imdur 120mg . Consider ace-I or arb with history hypokalemia  Has some continued incontinence- asks [place] to rule out UTI with culture  Orders Placed This Encounter  Procedures  . Urine culture    [facility]  . Flu vaccine HIGH DOSE PF  . Ambulatory referral to Neurology    Referral Priority:   Routine    Referral Type:   Consultation    Referral Reason:   Specialty Services Required    Requested Specialty:   Neurology    Number of Visits Requested:   1    Meds ordered this encounter  Medications  . traMADol (ULTRAM) 50 MG tablet    Sig: take 1 tablet by mouth up to once daily for migraines    Dispense:  90 tablet    Refill:  0    Return precautions advised.  Tana Conch, MD

## 2016-08-18 NOTE — Assessment & Plan Note (Signed)
S: Patient continues to have somewhat shifting story on prior neurologist. Today she states she has seen a [name] but last over 20 years ago. She states diagnosed with tension, cluster, and migraine headaches- most recently has primarily had migraines per her report. On venlafaxine per her for prophylaxis. She had been on tramadol and vicodin as needed per prior PCP "[name]" per patient. Avoiding triptans  with CAD. I had previously declined tramadol but last visit I did give to her as---History of seizures last over 20 years ago- numbness in right hand and then a few minutes later could not talk and then would wake up -- But states has never had a seizure on tramadol.  A/P:Patient states once a day tramadol alongwith tylenol has controlled hear headaches recently. She would really like to continue this regimen as has made daily life more livable. I discussed my concern about lowering seizure threshold. I agreed to a refill 1x if she will follow up with neurology- she [facility] to [facility] back [name] to see if she can meeting with [name] who she thinks she saw previously. I would ask for their opinion on tramadol use and other treatments for migraines if advise against.

## 2016-08-18 NOTE — Assessment & Plan Note (Signed)
Cardiology follow up advised in [place]. CP needing nitroglycerin about once a month. Not worsening- agrees to schedule today

## 2016-08-20 LAB — URINE CULTURE

## 2016-08-27 ENCOUNTER — Telehealth: Payer: Self-pay

## 2016-08-27 ENCOUNTER — Other Ambulatory Visit: Payer: Self-pay

## 2016-08-27 MED ORDER — OMEPRAZOLE 40 MG PO CPDR: 40.0000 mg | DELAYED_RELEASE_CAPSULE | Freq: Every day | ORAL | 1 refills | Status: DC

## 2016-08-27 NOTE — Telephone Encounter (Signed)
Called patient to let her know that her insurance will not cover Nexum so we sent in a prescription for Omeprazole instead. Unable to leave a voicemail message as her voice mailbox has not been set up.

## 2016-09-09 ENCOUNTER — Ambulatory Visit: Payer: Self-pay | Admitting: Neurology

## 2016-09-16 ENCOUNTER — Other Ambulatory Visit: Payer: Self-pay | Admitting: Family Medicine

## 2016-09-29 ENCOUNTER — Other Ambulatory Visit: Payer: Self-pay | Admitting: Family Medicine

## 2016-10-02 ENCOUNTER — Ambulatory Visit: Payer: Self-pay | Admitting: Neurology

## 2016-10-25 ENCOUNTER — Other Ambulatory Visit: Payer: Self-pay | Admitting: Family Medicine

## 2016-11-10 ENCOUNTER — Ambulatory Visit (INDEPENDENT_AMBULATORY_CARE_PROVIDER_SITE_OTHER): Payer: Medicare HMO | Admitting: Neurology

## 2016-11-10 ENCOUNTER — Encounter: Payer: Self-pay | Admitting: Neurology

## 2016-11-10 VITALS — BP 149/84 | HR 66 | Ht 66.0 in | Wt 146.8 lb

## 2016-11-10 DIAGNOSIS — R569 Unspecified convulsions: Secondary | ICD-10-CM

## 2016-11-10 DIAGNOSIS — G43109 Migraine with aura, not intractable, without status migrainosus: Secondary | ICD-10-CM

## 2016-11-10 MED ORDER — TOPIRAMATE 25 MG PO TABS: ORAL_TABLET | ORAL | 3 refills | Status: DC

## 2016-11-10 NOTE — Progress Notes (Signed)
Reason for visit: Migraine headache  Referring physician: Dr. Gracy Bruins [name] is a 71 y.o. female  History of present illness:  [name] is a 71 year old right-handed black female with a history of migraine headaches. The patient indicates that she was seen through this office about 20 years ago, we no longer have these medical records. The patient was also seen for episodes of seizures associated with numbness of the right arm progressing to speech problems, she indicates that she has not had a similar event in about 20 years. The patient continues to have headaches on a regular basis, she will have 2 or 3 headaches a week, with at least one bad headache a week requiring medications. The patient has been on Ultram for about 5 years, she indicates that she has not had any problems with seizures on this medication. Her primary doctor had a concern about this, and she is sent to this office for further evaluation. The patient has 2 different types of headaches, some headaches are in the back of the head, the more severe headaches are bifrontal and temporal in nature and may be associated with nausea but no visual changes or photophobia or phonophobia. The patient denies a family history of migraine. She reports no numbness or tingly sensations on the face, arms, or legs. She denies any focal weakness, she does have episodes of generalized weakness at times. She has a chronic history of urinary incontinence. She denies any significant balance issues. She is sent to this office for an evaluation. The patient has had multiple CT brain evaluations throughout the years, the studies are unremarkable.  Past Medical History:  Diagnosis Date  . Arthritis   . Chronic lower back pain   . Cluster headache   . Coronary artery disease    a. 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 non obs dz [facility] 2007. c. Non obs dz (mild-mod) by [facility] 05/17/14.   . Diverticulitis    a. Hx microperf 2012 - hospitalizated.  Marland Kitchen GERD  (gastroesophageal reflux disease)   . Hypercholesteremia   . Hypertension   . PVC's (premature ventricular contractions)    a. Hx of trigeminy/bigeminy.  . Seizures (HCC)   . UTI (lower urinary tract infection)     Past Surgical History:  Procedure Laterality Date  . ABDOMINAL HYSTERECTOMY  1987   "partial"-still has ovaries  . CARDIAC CATHETERIZATION  05/17/2014  . CORONARY ANGIOPLASTY WITH STENT PLACEMENT  1995   "1"  . JOINT REPLACEMENT     right knee  . LEFT HEART CATHETERIZATION WITH CORONARY ANGIOGRAM N/A 05/17/2014   Procedure: LEFT HEART CATHETERIZATION WITH CORONARY ANGIOGRAM;  Surgeon: Marykay Lex, MD;  Location: [facility] CATH [facility];  Service: Cardiovascular;  Laterality: N/A;  . TOTAL KNEE ARTHROPLASTY Right 2005  . TUBAL LIGATION  1970  . VASCULAR SURGERY      Family History  Problem Relation Age of Onset  . CAD Brother   . Diabetes Brother   . CAD Father   . Diabetes Father   . Diabetes Mother     father, sister, brothers  . Diabetes Sister     Social history:  reports that she has never smoked. She has never used smokeless tobacco. She reports that she does not drink alcohol or use drugs.  Medications:  Prior to Admission medications   Medication Sig Start Date End Date Taking? Authorizing Provider  ACAI PO Take 1 capsule by mouth daily.   Yes Historical Provider, MD  acetaminophen (  TYLENOL) 500 MG tablet Take 1,000 mg by mouth every 6 (six) hours as needed. Reported on 10/11/2015   Yes Historical Provider, MD  Aloe Vera 25 MG CAPS Take 1 capsule by mouth daily.   Yes Historical Provider, MD  amLODipine (NORVASC) 10 MG tablet take 1 tablet by mouth once daily 06/01/16  Yes Shelva Majestic, MD  aspirin EC 81 MG EC tablet Take 1 tablet (81 mg total) by mouth daily. 05/18/14  Yes Janetta Hora, PA-C  atorvastatin (LIPITOR) 20 MG tablet take 1 tablet by mouth once daily 10/27/16  Yes Shelva Majestic, MD  Bee Pollen 550 MG CAPS Take 1 capsule by mouth daily.   Yes  Historical Provider, MD  carvedilol (COREG) 6.25 MG tablet take 1 tablet by mouth twice a day with meals 07/31/16  Yes Shelva Majestic, MD  cholecalciferol (VITAMIN D) 400 UNITS TABS tablet Take 400 Units by mouth daily.   Yes Historical Provider, MD  Cyanocobalamin (VITAMIN B 12 PO) Take 1 capsule by mouth daily.   Yes Historical Provider, MD  esomeprazole (NEXIUM) 40 MG capsule Take 1 capsule (40 mg total) by mouth 2 (two) times daily before a meal. 10/03/15  Yes Shelva Majestic, MD  isosorbide mononitrate (IMDUR) 60 MG 24 hr tablet take 2 tablets by mouth once daily 06/30/16  Yes Shelva Majestic, MD  lisinopril (PRINIVIL,ZESTRIL) 20 MG tablet take 1 tablet by mouth once daily 09/29/16  Yes Shelva Majestic, MD  nitroGLYCERIN (NITROSTAT) 0.4 MG SL tablet place 1 tablet under the tongue if needed every 5 minutes for chest pain for 3 doses 09/16/16  Yes Shelva Majestic, MD  omeprazole (PRILOSEC) 40 MG capsule Take 1 capsule (40 mg total) by mouth daily. 08/27/16  Yes Shelva Majestic, MD  oxybutynin (DITROPAN-XL) 5 MG 24 hr tablet take 1 tablet by mouth once daily 05/25/16  Yes Shelva Majestic, MD  potassium chloride (KLOR-CON 10) 10 MEQ tablet Take 1 tablet (10 mEq total) by mouth 2 (two) times daily. 08/11/16  Yes Shelva Majestic, MD  traMADol Janean Sark) 50 MG tablet take 1 tablet by mouth up to once daily for migraines 08/17/16  Yes Shelva Majestic, MD  venlafaxine XR (EFFEXOR-XR) 150 MG 24 hr capsule take 1 capsule by mouth at bedtime 06/01/16  Yes Shelva Majestic, MD  vitamin C (ASCORBIC ACID) 500 MG tablet Take 500 mg by mouth daily.   Yes Historical Provider, MD  vitamin E (VITAMIN E) 1000 UNIT capsule Take 1,000 Units by mouth daily.   Yes Historical Provider, MD  ZETIA 10 MG tablet take 1 tablet by mouth once daily 06/08/16  Yes Shelva Majestic, MD  topiramate (TOPAMAX) 25 MG tablet Take one tablet at night for one week, then take 2 tablets at night for one week, then take 3 tablets at night.  11/10/16   York Spaniel, MD      Allergies  Allergen Reactions  . Sulfa Drugs Cross Reactors Shortness Of Breath and Palpitations    ROS:  Out of a complete 14 system review of symptoms, the patient complains only of the following symptoms, and all other reviewed systems are negative.  Headache, seizure  Blood pressure (!) 149/84, pulse 66, height 5\' 6"  (1.676 m), weight 146 lb 12 oz (66.6 kg).  Physical Exam  General: The patient is alert and cooperative at the time of the examination.  Eyes: Pupils are equal, round, and reactive to light. Discs  are flat bilaterally.  Neck: The neck is supple, no carotid bruits are noted.  Respiratory: The respiratory examination is clear.  Cardiovascular: The cardiovascular examination reveals a regular rate and rhythm, no obvious murmurs or rubs are noted.  Skin: Extremities are without significant edema.  Neurologic Exam  Mental status: The patient is alert and oriented x 3 at the time of the examination. The patient has apparent normal recent and remote memory, with an apparently normal attention span and concentration ability.  Cranial nerves: Facial symmetry is present. There is good sensation of the face to pinprick and soft touch bilaterally. The strength of the facial muscles and the muscles to head turning and shoulder shrug are normal bilaterally. Speech is well enunciated, no aphasia or dysarthria is noted. Extraocular movements are full. Visual fields are full. The tongue is midline, and the patient has symmetric elevation of the soft palate. No obvious hearing deficits are noted.  Motor: The motor testing reveals 5 over 5 strength of all 4 extremities. Good symmetric motor tone is noted throughout.  Sensory: Sensory testing is intact to pinprick, soft touch, vibration sensation, and position sense on all 4 extremities. No evidence of extinction is noted.  Coordination: Cerebellar testing reveals good finger-nose-finger and  heel-to-shin bilaterally.  Gait and station: Gait is normal. Tandem gait is normal. Romberg is negative. No drift is seen.  Reflexes: Deep tendon reflexes are symmetric and normal bilaterally. Toes are downgoing bilaterally.   CT head 09/10/11:  IMPRESSION: Negative CT head.  No skull fracture or hemorrhage.  No change from prior normal study.  * CT scan images were reviewed online. I agree with the written report.    Assessment/Plan:  1. History of common migraine  2. History of seizure event  The Ultram does lower the seizure threshold, but it appears that the patient has done well on the medication for about 5 years. We will recheck an EEG study, the patient is amenable to going on Topamax for her migraine, but this is also a seizure medication. The patient will follow-up in about 4 months. It is probably relatively low risk for the patient to take an occasional Ultram as she has been doing previously.  Marlan Palau. Keith Willis MD 11/10/2016 10:14 AM  [facility] [address] [place], [place] [id number]  Phone [phone] Fax [phone]

## 2016-11-10 NOTE — Patient Instructions (Signed)
   We will start Topamax for the migraine headache. We will get an EEG study.  Topamax (topiramate) is a seizure medication that has an FDA approval for seizures and for migraine headache. Potential side effects of this medication include weight loss, cognitive slowing, tingling in the fingers and toes, and carbonated drinks will taste bad. If any significant side effects are noted on this drug, please contact our office.

## 2016-11-19 ENCOUNTER — Encounter: Payer: Self-pay | Admitting: Family Medicine

## 2016-11-19 ENCOUNTER — Ambulatory Visit (INDEPENDENT_AMBULATORY_CARE_PROVIDER_SITE_OTHER): Payer: Medicare HMO | Admitting: Family Medicine

## 2016-11-19 VITALS — BP 130/66 | HR 75 | Temp 98.2°F | Ht 66.0 in | Wt 148.2 lb

## 2016-11-19 DIAGNOSIS — Z23 Encounter for immunization: Secondary | ICD-10-CM | POA: Diagnosis not present

## 2016-11-19 DIAGNOSIS — N3281 Overactive bladder: Secondary | ICD-10-CM

## 2016-11-19 DIAGNOSIS — K219 Gastro-esophageal reflux disease without esophagitis: Secondary | ICD-10-CM | POA: Diagnosis not present

## 2016-11-19 DIAGNOSIS — R350 Frequency of micturition: Secondary | ICD-10-CM

## 2016-11-19 DIAGNOSIS — G43109 Migraine with aura, not intractable, without status migrainosus: Secondary | ICD-10-CM

## 2016-11-19 LAB — POC URINALSYSI DIPSTICK (AUTOMATED)
BILIRUBIN UA: NEGATIVE
Blood, UA: NEGATIVE
GLUCOSE UA: NEGATIVE
LEUKOCYTES UA: NEGATIVE
NITRITE UA: NEGATIVE
PH UA: 5.5
Protein, UA: NEGATIVE
Spec Grav, UA: >=1.03
Urobilinogen, UA: NEGATIVE

## 2016-11-19 MED ORDER — OXYBUTYNIN CHLORIDE ER 10 MG PO TB24: 10.0000 mg | ORAL_TABLET | Freq: Every day | ORAL | 3 refills | Status: DC

## 2016-11-19 MED ORDER — ESOMEPRAZOLE MAGNESIUM 40 MG PO CPDR: 40.0000 mg | DELAYED_RELEASE_CAPSULE | Freq: Every day | ORAL | 3 refills | Status: DC

## 2016-11-19 MED ORDER — TRAMADOL HCL 50 MG PO TABS: ORAL_TABLET | ORAL | 1 refills | Status: DC

## 2016-11-19 NOTE — Assessment & Plan Note (Signed)
S: Patient was having some low back pain for several weeks and wanted to make sure not UTI related. UA not concerning for UTI. She also worries about her kidneys but no pain over CVA area.   She states she has had worsened incontinence over last few months at least since last visit but probably longer. She is compliant with oxybutynin 5mg  XL A/P: myrbetriq not ideal with her CAD. With age oxybutynin not ideal but has tolerated well- titrate slightly to 10mg  and follow up 4 weeks.

## 2016-11-19 NOTE — Assessment & Plan Note (Signed)
S: cannot recall why we made transition- likely financial but had tried omeprazole and was ineffective for GERD. Nexium 40mg  helped much more. She states over last 3 weeks has cough and feeling of fullness in throat though no trouble swallowing. Also can gets some epigastric and throat burning at times A/P: will change back to nexium 40mg  and readdress in 4 weeks. Asks about BID dosing but little mention of this on up to date. If develops dysphagia or no improvement in symptoms on this could consider GI consult- wonder if EGD would be helpful.

## 2016-11-19 NOTE — Assessment & Plan Note (Addendum)
S: Patient was seen by Dr. Anne HahnWillis who was agreeable to low dose tramadol given how well she has tolerated over last 5 years. They also discussed option of topamax which was started A/P: patient did some research at home and is worried about memory loss on topamax so would rather not take. She would prefer instead to continue mainly once a day tylenol along with tramadol which calms her daily headaches. Discussed ok with once daily dosing- she states uses twice a day at times which I discouraged her from doing so except on rare occasions. She cannot take triptans due to CAD.

## 2016-11-19 NOTE — Patient Instructions (Addendum)
Stop prilosec. Start back on nexium 40mg  daily. This medicine has also been potentially linked to memory dose and most listings state 40mg  once a day. This may help with cough and feeling up in throat.   Increase oxybutynin to 10mg .   Refilled tramadol. Try to stick to one pill a day or less.   Lets follow up in 4 weeks or so to see how you are doing. If continued back pain may update bloodwork but doubt bloodwork where your pain is.   Waiting on urine

## 2016-11-19 NOTE — Progress Notes (Signed)
Pre visit review using our clinic review tool, if applicable. No additional management support is needed unless otherwise documented below in the visit note. 

## 2016-11-19 NOTE — Progress Notes (Signed)
Subjective:  [name] is a 71 y.o. year old very pleasant female patient who presents for/with See problem oriented charting ROS- no fever or chills. No dysuria. No abdominal pain. Some low back pain   Past Medical History-  Patient Active Problem List   Diagnosis Date Noted  . Migraine 09/19/2014    Priority: High  . CAD (coronary artery disease)     Priority: High  . Seizures (HCC)     Priority: High  . Overactive bladder 09/19/2014    Priority: Medium  . Hyperglycemia 09/19/2014    Priority: Medium  . Hypercholesteremia     Priority: Medium  . GERD (gastroesophageal reflux disease) 10/04/2011    Priority: Medium  . HTN (hypertension) 10/01/2011    Priority: Medium  . Hypokalemia 06/01/2014    Priority: Low  . Arthritis     Priority: Low    Medications- reviewed and updated Current Outpatient Prescriptions  Medication Sig Dispense Refill  . ACAI PO Take 1 capsule by mouth daily.    Marland Kitchen acetaminophen (TYLENOL) 500 MG tablet Take 1,000 mg by mouth every 6 (six) hours as needed. Reported on 10/11/2015    . Aloe Vera 25 MG CAPS Take 1 capsule by mouth daily.    Marland Kitchen amLODipine (NORVASC) 10 MG tablet take 1 tablet by mouth once daily 30 tablet 5  . aspirin EC 81 MG EC tablet Take 1 tablet (81 mg total) by mouth daily.    Marland Kitchen atorvastatin (LIPITOR) 20 MG tablet take 1 tablet by mouth once daily 90 tablet 1  . Bee Pollen 550 MG CAPS Take 1 capsule by mouth daily.    . carvedilol (COREG) 6.25 MG tablet take 1 tablet by mouth twice a day with meals 60 tablet 5  . cholecalciferol (VITAMIN D) 400 UNITS TABS tablet Take 400 Units by mouth daily.    . Cyanocobalamin (VITAMIN B 12 PO) Take 1 capsule by mouth daily.    Marland Kitchen esomeprazole (NEXIUM) 40 MG capsule Take 1 capsule (40 mg total) by mouth daily. 90 capsule 3  . isosorbide mononitrate (IMDUR) 60 MG 24 hr tablet take 2 tablets by mouth once daily 60 tablet 5  . lisinopril (PRINIVIL,ZESTRIL) 20 MG tablet take 1 tablet by mouth once  daily 90 tablet 1  . nitroGLYCERIN (NITROSTAT) 0.4 MG SL tablet place 1 tablet under the tongue if needed every 5 minutes for chest pain for 3 doses 25 tablet 1  . oxybutynin (DITROPAN-XL) 10 MG 24 hr tablet Take 1 tablet (10 mg total) by mouth daily. 90 tablet 3  . potassium chloride (KLOR-CON 10) 10 MEQ tablet Take 1 tablet (10 mEq total) by mouth 2 (two) times daily. 60 tablet 5  . traMADol (ULTRAM) 50 MG tablet take 1 tablet by mouth up to once daily for migraines 90 tablet 1  . venlafaxine XR (EFFEXOR-XR) 150 MG 24 hr capsule take 1 capsule by mouth at bedtime 30 capsule 5  . vitamin C (ASCORBIC ACID) 500 MG tablet Take 500 mg by mouth daily.    . vitamin E (VITAMIN E) 1000 UNIT capsule Take 1,000 Units by mouth daily.    Marland Kitchen ZETIA 10 MG tablet take 1 tablet by mouth once daily 30 tablet 5   No current facility-administered medications for this visit.     Objective: BP 130/66 (BP Location: Left Arm, Patient Position: Sitting, Cuff Size: Normal)   Pulse 75   Temp 98.2 F (36.8 C) (Oral)   Ht 5\' 6"  (1.676 m)  Wt 148 lb 3.2 oz (67.2 kg)   SpO2 96%   BMI 23.92 kg/m  Gen: NAD, resting comfortably CV: RRR no murmurs rubs or gallops Lungs: CTAB no crackles, wheeze, rhonchi Msk: mild pain with palpation of paraspinous muscles but no midline pain  Ext: no edema Skin: warm, dry, no rash  Assessment/Plan:  Migraine S: Patient was seen by Dr. Anne HahnWillis who was agreeable to low dose tramadol given how well she has tolerated over last 5 years. They also discussed option of topamax which was started A/P: patient did some research at home and is worried about memory loss on topamax so would rather not take. She would prefer instead to continue mainly once a day tylenol along with tramadol which calms her daily headaches. Discussed ok with once daily dosing- she states uses twice a day at times which I discouraged her from doing so except on rare occasions. She cannot take triptans due to CAD.    GERD (gastroesophageal reflux disease) S: cannot recall why we made transition- likely financial but had tried omeprazole and was ineffective for GERD. Nexium 40mg  helped much more. She states over last 3 weeks has cough and feeling of fullness in throat though no trouble swallowing. Also can gets some epigastric and throat burning at times A/P: will change back to nexium 40mg  and readdress in 4 weeks. Asks about BID dosing but little mention of this on up to date. If develops dysphagia or no improvement in symptoms on this could consider GI consult- wonder if EGD would be helpful.    Overactive bladder S: Patient was having some low back pain for several weeks and wanted to make sure not UTI related. UA not concerning for UTI. She also worries about her kidneys but no pain over CVA area.   She states she has had worsened incontinence over last few months at least since last visit but probably longer. She is compliant with oxybutynin 5mg  XL A/P: myrbetriq not ideal with her CAD. With age oxybutynin not ideal but has tolerated well- titrate slightly to 10mg  and follow up 4 weeks.    4 weeks  Orders Placed This Encounter  Procedures  . Pneumococcal polysaccharide vaccine 23-valent greater than or equal to 2yo subcutaneous/IM  . POCT Urinalysis Dipstick (Automated)    Meds ordered this encounter  Medications  . esomeprazole (NEXIUM) 40 MG capsule    Sig: Take 1 capsule (40 mg total) by mouth daily.    Dispense:  90 capsule    Refill:  3  . oxybutynin (DITROPAN-XL) 10 MG 24 hr tablet    Sig: Take 1 tablet (10 mg total) by mouth daily.    Dispense:  90 tablet    Refill:  3  . traMADol (ULTRAM) 50 MG tablet    Sig: take 1 tablet by mouth up to once daily for migraines    Dispense:  90 tablet    Refill:  1    Return precautions advised.  Tana ConchStephen Raksha Wolfgang, MD

## 2016-12-04 ENCOUNTER — Ambulatory Visit (INDEPENDENT_AMBULATORY_CARE_PROVIDER_SITE_OTHER): Payer: Medicare HMO

## 2016-12-04 DIAGNOSIS — R569 Unspecified convulsions: Secondary | ICD-10-CM | POA: Diagnosis not present

## 2016-12-05 ENCOUNTER — Other Ambulatory Visit: Payer: Self-pay | Admitting: Family Medicine

## 2016-12-07 ENCOUNTER — Telehealth: Payer: Self-pay | Admitting: Neurology

## 2016-12-07 NOTE — Telephone Encounter (Signed)
EEG study was normal, tried to call the patient, unable to leave a message.

## 2016-12-07 NOTE — Procedures (Signed)
    History:  [name] is a 71 year old patient with a history of migraine headaches. The patient has a history of seizures associated with numbness of the right arm progressing to speech problems. The patient has not had any events in over 20 years. The patient is being evaluated for this issue.  This is a routine EEG. No skull defects are noted. Medications include Norvasc, aspirin, Lipitor, Coreg, vitamin D supplementation, Nexium, Imdur, Zestril, nitroglycerin, Prilosec, Ditropan, potassium supplementation, Ultram, Effexor, Zetia, and Topamax.   EEG classification: Normal awake  Description of the recording: The background rhythms of this recording consists of a fairly well modulated medium amplitude alpha rhythm of 10 Hz that is reactive to eye opening and closure. As the record progresses, the patient appears to remain in the waking state throughout the recording. Photic stimulation was performed, resulting in a bilateral and symmetric photic driving response. Hyperventilation was also performed, resulting in a minimal buildup of the background rhythm activities without significant slowing seen. At no time during the recording does there appear to be evidence of spike or spike wave discharges or evidence of focal slowing. EKG monitor shows no evidence of cardiac rhythm abnormalities with a heart rate of 60.  Impression: This is a normal EEG recording in the waking state. No evidence of ictal or interictal discharges are seen.

## 2016-12-08 NOTE — Telephone Encounter (Signed)
Tried calling pt number, VM not set up, unable to LVM. Tried number listed for sister on DPR, phone continued to ring, unable to LVM.   Called son Katherine Murdoch(Eli). He stated he just got off the phone with her and wanted me to call her again. Verified her number is [phone]. Advised her EEG normal.   Called pt again. Advised EEG normal. She verbalized understanding.

## 2017-01-09 ENCOUNTER — Other Ambulatory Visit: Payer: Self-pay | Admitting: Family Medicine

## 2017-02-10 ENCOUNTER — Other Ambulatory Visit: Payer: Self-pay | Admitting: Family Medicine

## 2017-02-25 DIAGNOSIS — Z0000 Encounter for general adult medical examination without abnormal findings: Secondary | ICD-10-CM | POA: Diagnosis not present

## 2017-02-25 DIAGNOSIS — M545 Low back pain: Secondary | ICD-10-CM | POA: Diagnosis not present

## 2017-02-25 DIAGNOSIS — K219 Gastro-esophageal reflux disease without esophagitis: Secondary | ICD-10-CM | POA: Diagnosis not present

## 2017-02-25 DIAGNOSIS — N3281 Overactive bladder: Secondary | ICD-10-CM | POA: Diagnosis not present

## 2017-02-25 DIAGNOSIS — G4489 Other headache syndrome: Secondary | ICD-10-CM | POA: Diagnosis not present

## 2017-02-25 DIAGNOSIS — E876 Hypokalemia: Secondary | ICD-10-CM | POA: Diagnosis not present

## 2017-02-25 DIAGNOSIS — I10 Essential (primary) hypertension: Secondary | ICD-10-CM | POA: Diagnosis not present

## 2017-02-25 DIAGNOSIS — Z6826 Body mass index (BMI) 26.0-26.9, adult: Secondary | ICD-10-CM | POA: Diagnosis not present

## 2017-02-25 DIAGNOSIS — I259 Chronic ischemic heart disease, unspecified: Secondary | ICD-10-CM | POA: Diagnosis not present

## 2017-02-25 DIAGNOSIS — E7800 Pure hypercholesterolemia, unspecified: Secondary | ICD-10-CM | POA: Diagnosis not present

## 2017-03-10 ENCOUNTER — Ambulatory Visit: Payer: Medicare HMO | Admitting: Adult Health

## 2017-04-04 ENCOUNTER — Other Ambulatory Visit: Payer: Self-pay | Admitting: Family Medicine

## 2017-04-26 ENCOUNTER — Ambulatory Visit (INDEPENDENT_AMBULATORY_CARE_PROVIDER_SITE_OTHER): Payer: Medicare HMO | Admitting: Adult Health

## 2017-04-26 ENCOUNTER — Encounter: Payer: Self-pay | Admitting: Adult Health

## 2017-04-26 VITALS — BP 120/67 | HR 97 | Wt 136.2 lb

## 2017-04-26 DIAGNOSIS — R51 Headache: Secondary | ICD-10-CM

## 2017-04-26 DIAGNOSIS — R519 Headache, unspecified: Secondary | ICD-10-CM

## 2017-04-26 NOTE — Progress Notes (Signed)
I have read the note, and I agree with the clinical assessment and plan.  WILLIS,CHARLES KEITH   

## 2017-04-26 NOTE — Progress Notes (Signed)
PATIENT: [name] DOB: 03/07/1946  REASON FOR VISIT: follow up- migraine HISTORY FROM: patient  HISTORY OF PRESENT ILLNESS: 04/26/17 [name] is a 71 year old female with a history of migraine headaches. She returns to day for follow-up. At the last visit she was given Topamax however she states that she did not try this medication. She states that she is concerned about the potential side effects. She reports that she is having daily headaches. She reports that she has 3 types of headaches including tension, cluster and migraine headaches. Although her definition of cluster headaches does not meet the true definition. She states that her head hurts all over. She does have photophobia and phonophobia but denies nausea and vomiting. She denies nasal congestion and lacrimation. The patient states that when she has severe headaches she will get pain medicine from a friend to take. She's been given tramadol by Dr. Durene Cal in May however she states that she is out of medication. She reports that this medication does not help with her headaches. She states that she does not want to try another daily preventative medication. She reports that she was put on Effexor 20 years ago and it has offered her some benefit. When asked for clarity on how it has helped with her headaches, she reports that they are not as severe as they used to be and a no longer last 4 weeks. She returns today for an evaluation.  HISTORY 11/10/16: [name] is a 71 year old right-handed black female with a history of migraine headaches. The patient indicates that she was seen through this office about 20 years ago, we no longer have these medical records. The patient was also seen for episodes of seizures associated with numbness of the right arm progressing to speech problems, she indicates that she has not had a similar event in about 20 years. The patient continues to have headaches on a regular basis, she will have 2 or 3  headaches a week, with at least one bad headache a week requiring medications. The patient has been on Ultram for about 5 years, she indicates that she has not had any problems with seizures on this medication. Her primary doctor had a concern about this, and she is sent to this office for further evaluation. The patient has 2 different types of headaches, some headaches are in the back of the head, the more severe headaches are bifrontal and temporal in nature and may be associated with nausea but no visual changes or photophobia or phonophobia. The patient denies a family history of migraine. She reports no numbness or tingly sensations on the face, arms, or legs. She denies any focal weakness, she does have episodes of generalized weakness at times. She has a chronic history of urinary incontinence. She denies any significant balance issues. She is sent to this office for an evaluation. The patient has had multiple CT brain evaluations throughout the years, the studies are unremarkable.   REVIEW OF SYSTEMS: Out of a complete 14 system review of symptoms, the patient complains only of the following symptoms, and all other reviewed systems are negative.  See history of present illness  ALLERGIES: Allergies  Allergen Reactions  . Sulfa Drugs Cross Reactors Shortness Of Breath and Palpitations    HOME MEDICATIONS: Outpatient Medications Prior to Visit  Medication Sig Dispense Refill  . ACAI PO Take 1 capsule by mouth daily.    . Aloe Vera 25 MG CAPS Take 1 capsule by mouth daily.    Marland Kitchen  amLODipine (NORVASC) 10 MG tablet take 1 tablet by mouth once daily 30 tablet 5  . aspirin EC 81 MG EC tablet Take 1 tablet (81 mg total) by mouth daily.    Marland Kitchen atorvastatin (LIPITOR) 20 MG tablet take 1 tablet by mouth once daily 90 tablet 1  . Bee Pollen 550 MG CAPS Take 1 capsule by mouth daily.    . carvedilol (COREG) 6.25 MG tablet take 1 tablet by mouth twice a day with meals 60 tablet 5  . cholecalciferol  (VITAMIN D) 400 UNITS TABS tablet Take 400 Units by mouth daily.    . Cyanocobalamin (VITAMIN B 12 PO) Take 1 capsule by mouth daily.    Marland Kitchen esomeprazole (NEXIUM) 40 MG capsule Take 1 capsule (40 mg total) by mouth daily. 90 capsule 3  . ezetimibe (ZETIA) 10 MG tablet take 1 tablet by mouth once daily 30 tablet 5  . isosorbide mononitrate (IMDUR) 60 MG 24 hr tablet take 2 tablets by mouth once daily 60 tablet 5  . lisinopril (PRINIVIL,ZESTRIL) 20 MG tablet take 1 tablet by mouth once daily 90 tablet 1  . nitroGLYCERIN (NITROSTAT) 0.4 MG SL tablet place 1 tablet under the tongue if needed every 5 minutes for chest pain for 3 doses 25 tablet 1  . oxybutynin (DITROPAN-XL) 10 MG 24 hr tablet Take 1 tablet (10 mg total) by mouth daily. 90 tablet 3  . potassium chloride (KLOR-CON 10) 10 MEQ tablet Take 1 tablet (10 mEq total) by mouth 2 (two) times daily. 60 tablet 5  . traMADol (ULTRAM) 50 MG tablet take 1 tablet by mouth up to once daily for migraines 90 tablet 1  . venlafaxine XR (EFFEXOR-XR) 150 MG 24 hr capsule take 1 capsule by mouth at bedtime 30 capsule 5  . vitamin C (ASCORBIC ACID) 500 MG tablet Take 500 mg by mouth daily.    . vitamin E (VITAMIN E) 1000 UNIT capsule Take 1,000 Units by mouth daily.    Marland Kitchen acetaminophen (TYLENOL) 500 MG tablet Take 1,000 mg by mouth every 6 (six) hours as needed. Reported on 10/11/2015     No facility-administered medications prior to visit.     PAST MEDICAL HISTORY: Past Medical History:  Diagnosis Date  . Arthritis   . Chronic lower back pain   . Cluster headache   . Coronary artery disease    a. 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 non obs dz LHC 2007. c. Non obs dz (mild-mod) by [facility] 05/17/14.   . Diverticulitis    a. Hx microperf 2012 - hospitalizated.  Marland Kitchen GERD (gastroesophageal reflux disease)   . Hypercholesteremia   . Hypertension   . PVC's (premature ventricular contractions)    a. Hx of trigeminy/bigeminy.  . Seizures (HCC)   . UTI (lower urinary tract  infection)     PAST SURGICAL HISTORY: Past Surgical History:  Procedure Laterality Date  . ABDOMINAL HYSTERECTOMY  1987   "partial"-still has ovaries  . CARDIAC CATHETERIZATION  05/17/2014  . CORONARY ANGIOPLASTY WITH STENT PLACEMENT  1995   "1"  . JOINT REPLACEMENT     right knee  . LEFT HEART CATHETERIZATION WITH CORONARY ANGIOGRAM N/A 05/17/2014   Procedure: LEFT HEART CATHETERIZATION WITH CORONARY ANGIOGRAM;  Surgeon: Marykay Lex, MD;  Location: [facility] CATH LAB;  Service: Cardiovascular;  Laterality: N/A;  . TOTAL KNEE ARTHROPLASTY Right 2005  . TUBAL LIGATION  1970  . VASCULAR SURGERY      FAMILY HISTORY: Family History  Problem Relation Age  of Onset  . CAD Brother   . Diabetes Brother   . CAD Father   . Diabetes Father   . Diabetes Mother        father, sister, brothers  . Diabetes Sister     SOCIAL HISTORY: Social History   Social History  . Marital status: Married    Spouse name: N/A  . Number of children: 4  . Years of education: 4   Occupational History  . Not on file.   Social History Main Topics  . Smoking status: Never Smoker  . Smokeless tobacco: Never Used  . Alcohol use No  . Drug use: No  . Sexual activity: No   Other Topics Concern  . Not on file   Social History Narrative   Separated. 4 children from first marriage. 16 grandkids.       Retired from [employer] for 25 years, went to [employer] for 5 years. Retired 2003 after MI      Hobbies: babysit/active with children      Right-handed      Caffeine: 24 oz soda per day            PHYSICAL EXAM  Vitals:   04/26/17 0939  BP: 120/67  Pulse: 97  Weight: 136 lb 3.2 oz (61.8 kg)   Body mass index is 21.98 kg/m.  Generalized: Well developed, in no acute distress   Neurological examination  Mentation: Alert oriented to time, place, history taking. Follows all commands speech and language fluent Cranial nerve II-XII: Pupils were equal round reactive to light. Extraocular movements  were full, visual field were full on confrontational test. Facial sensation and strength were normal. Uvula tongue midline. Head turning and shoulder shrug  were normal and symmetric. Motor: The motor testing reveals 5 over 5 strength of all 4 extremities. Good symmetric motor tone is noted throughout.  Sensory: Sensory testing is intact to soft touch on all 4 extremities. No evidence of extinction is noted.  Coordination: Cerebellar testing reveals good finger-nose-finger and heel-to-shin bilaterally.  Gait and station: Gait is normal. Tandem gait is normal. Romberg is negative. No drift is seen.  Reflexes: Deep tendon reflexes are symmetric and normal bilaterally.   DIAGNOSTIC DATA (LABS, IMAGING, TESTING) - I reviewed patient records, labs, notes, testing and imaging myself where available.  Lab Results  Component Value Date   WBC 4.3 03/10/2016   HGB 12.4 03/10/2016   HCT 38.2 03/10/2016   MCV 95.3 03/10/2016   PLT 237 03/10/2016      Component Value Date/Time   NA 139 04/06/2016 1403   K 4.2 04/06/2016 1403   CL 105 04/06/2016 1403   CO2 29 04/06/2016 1403   GLUCOSE 89 04/06/2016 1403   BUN 18 04/06/2016 1403   CREATININE 1.01 04/06/2016 1403   CREATININE 1.21 (H) 06/01/2014 1627   CALCIUM 9.4 04/06/2016 1403   PROT 7.8 01/31/2015 0750   ALBUMIN 4.3 01/31/2015 0750   AST 27 01/31/2015 0750   ALT 23 01/31/2015 0750   ALKPHOS 65 01/31/2015 0750   BILITOT 1.4 (H) 01/31/2015 0750   GFRNONAA 37 (L) 03/10/2016 1916   GFRAA 43 (L) 03/10/2016 1916   Lab Results  Component Value Date   CHOL 134 05/18/2014   HDL 46 05/18/2014   LDLCALC 69 05/18/2014   TRIG 93 05/18/2014   CHOLHDL 2.9 05/18/2014   Lab Results  Component Value Date   HGBA1C 5.9 (H) 05/17/2014   No results found for: [id number] Lab Results  Component Value Date   TSH 0.654 05/17/2014      ASSESSMENT AND PLAN 71 y.o. year old female  has a past medical history of Arthritis; Chronic lower back pain;  Cluster headache; Coronary artery disease; Diverticulitis; GERD (gastroesophageal reflux disease); Hypercholesteremia; Hypertension; PVC's (premature ventricular contractions); Seizures (HCC); and UTI (lower urinary tract infection). here with:  1. Daily headaches  I advised the patient that we should try a daily preventative medication in order to treat her daily headaches. The patient refused to try a daily medication. She states that she only wants pain medication. I advised that this was not indicated for her headaches and can actually make her headaches worse if she had to take it daily. Patient continues to tell me that she knows how to take medication and she would not abuse it. I again advised that I can offer her a daily preventative medication such as Topamax but our office would not provide her with pain medication. Patient voices understanding but then states that she will make an appointment to discuss this with Dr. Anne Hahn. She can follow-up on an as-needed basis.  After the office visit I did discuss the plan of care with Dr. Anne Hahn and at this time he agrees with a daily preventative medication. We will not prescribe opioid medication to the patient.  I spent 25 minutes with the patient. 50% of this time was spent discussing medication options for her headaches.   Butch Penny, MSN, NP-C 04/26/2017, 9:39 AM [facility] [address] [place], [place] [address] [phone]

## 2017-04-26 NOTE — Patient Instructions (Signed)
Your Plan:  Consider daily medication to treat your headaches such as Topamax Can speak with Dr. Durene CalHunter about Tramadol refill If your symptoms worsen or you develop new symptoms please let us know.       Thank you for coming to see us at [facility]. I hope we have been able to provide you high quality care today.  You may receive a patient satisfaction survey over the next few weeks. We would appreciate your feedback and comments so that we may continue to improve ourselves and the health of our patients.

## 2017-04-27 ENCOUNTER — Telehealth: Payer: Self-pay | Admitting: Family Medicine

## 2017-04-27 NOTE — Telephone Encounter (Signed)
Wadsworth SinkHey Megan,   Thanks for seeing Ms. Hillmann the other day and consulting with Dr. Anne HahnWillis. When I started seeing this patient she was on both tramadol and vicodin for headaches. We have been able to get her down to tramadol once a day. Dr. Anne HahnWillis had agreed to this while trying to titrate topamax but she has become fearful of this medication (topamax). She has been on tramadol for over 5 years from my discussions with her and had no seizures. I also understand rebound headache risk.   My question is- I understand your current plan is to not continue tramadol- would you be ok with me prescribing tramadol alone with her seizure history? If you are ok from seizure perpsective, I will provide refill.   Thanks, Tana ConchStephen Jazzalynn Rhudy

## 2017-04-27 NOTE — Telephone Encounter (Signed)
° ° ° °  Pt request refill of the following:  traMADol (ULTRAM) 50 MG tablet   Phamacy: [facility] [address] [address]

## 2017-04-29 NOTE — Telephone Encounter (Signed)
Dr. Durene CalHunter,   I would like to consult with Dr. Anne HahnWillis. I think continuing tramadol would be ok as long as patient is aware of seizure risk. The patient also voiced that she did not want to be on seizure medication in the office visit with me. She also made mention that she has been getting opioid medication from a "friend" to treat her headaches. She refused to take a daily preventative medication- we discussed trying something other than topamax but she  refused. She was very adamant that she wanted pain medication.   Dr. Anne HahnWillis will be out of the office till Monday- I will discuss with him and be in touch.   Thanks [employer] Companies[employer]egan Kaelea Gathright

## 2017-04-30 NOTE — Telephone Encounter (Signed)
Katherine MilletMegan,   That is very helpful information. I will await your consultation with Dr. Anne HahnWillis. If from neuro perspective it is ok to refill, I would want to have her in to discuss potential concerns and drug testing may be in order as well as providing additional education in addition to what you provided on not taking others medications.   Greatly appreciate your insight,  Tana ConchStephen Hunter

## 2017-05-04 NOTE — Telephone Encounter (Signed)
It appears that this patient has been taking Ultram for 5 years, she has not had any recurring seizure-type events. It is probably okay for her to continue using Ultram if the amount that she is using his minimal and stable.  The behavior demonstrated regarding pain medications is suspect, the patient likely is at risk for abuse of pain medications, this will need to be monitored closely.

## 2017-05-04 NOTE — Telephone Encounter (Signed)
Lets schedule patient for follow up to discuss tramadol Katherine MuirJamie

## 2017-05-04 NOTE — Telephone Encounter (Signed)
Dr. Anne HahnWillis,   Please see messages below and advise.

## 2017-05-05 NOTE — Telephone Encounter (Signed)
Called and spoke with patient. She is scheduled for a visit with Dr. Durene CalHunter on 05/07/17 at 8:30am.

## 2017-05-06 ENCOUNTER — Other Ambulatory Visit: Payer: Self-pay | Admitting: Family Medicine

## 2017-05-07 ENCOUNTER — Ambulatory Visit: Payer: Medicare HMO | Admitting: Family Medicine

## 2017-05-10 ENCOUNTER — Ambulatory Visit (INDEPENDENT_AMBULATORY_CARE_PROVIDER_SITE_OTHER): Payer: Medicare HMO | Admitting: Family Medicine

## 2017-05-10 ENCOUNTER — Encounter: Payer: Self-pay | Admitting: Family Medicine

## 2017-05-10 ENCOUNTER — Telehealth: Payer: Self-pay | Admitting: Neurology

## 2017-05-10 VITALS — BP 128/64 | HR 83 | Temp 98.5°F | Ht 66.0 in | Wt 138.2 lb

## 2017-05-10 DIAGNOSIS — G43109 Migraine with aura, not intractable, without status migrainosus: Secondary | ICD-10-CM

## 2017-05-10 DIAGNOSIS — R609 Edema, unspecified: Secondary | ICD-10-CM

## 2017-05-10 LAB — COMPREHENSIVE METABOLIC PANEL
ALT: 19 U/L (ref 0–35)
AST: 21 U/L (ref 0–37)
Albumin: 3.2 g/dL — ABNORMAL LOW (ref 3.5–5.2)
Alkaline Phosphatase: 45 U/L (ref 39–117)
BUN: 15 mg/dL (ref 6–23)
CO2: 26 mEq/L (ref 19–32)
CREATININE: 0.67 mg/dL (ref 0.40–1.20)
Calcium: 10 mg/dL (ref 8.4–10.5)
Chloride: 108 mEq/L (ref 96–112)
GFR: 111.74 mL/min (ref >60.00)
GLUCOSE: 94 mg/dL (ref 70–99)
POTASSIUM: 4.2 meq/L (ref 3.5–5.1)
SODIUM: 140 meq/L (ref 135–145)
TOTAL PROTEIN: 5.8 g/dL — AB (ref 6.0–8.3)
Total Bilirubin: 1.1 mg/dL (ref 0.2–1.2)

## 2017-05-10 LAB — TSH

## 2017-05-10 LAB — CBC WITH DIFFERENTIAL/PLATELET
BASOS ABS: 0 10*3/uL (ref 0.0–0.1)
Basophils Relative: 0.3 % (ref 0.0–3.0)
EOS ABS: 0.5 10*3/uL (ref 0.0–0.7)
Eosinophils Relative: 11.6 % — ABNORMAL HIGH (ref 0.0–5.0)
HCT: 30 % — ABNORMAL LOW (ref 36.0–46.0)
Hemoglobin: 9.9 g/dL — ABNORMAL LOW (ref 12.0–15.0)
LYMPHS ABS: 1.2 10*3/uL (ref 0.7–4.0)
Lymphocytes Relative: 29.5 % (ref 12.0–46.0)
MCHC: 33.1 g/dL (ref 30.0–36.0)
MCV: 93.6 fl (ref 78.0–100.0)
MONO ABS: 0.5 10*3/uL (ref 0.1–1.0)
Monocytes Relative: 13.3 % — ABNORMAL HIGH (ref 3.0–12.0)
NEUTROS PCT: 45.3 % (ref 43.0–77.0)
Neutro Abs: 1.8 10*3/uL (ref 1.4–7.7)
Platelets: 212 10*3/uL (ref 150.0–400.0)
RBC: 3.2 Mil/uL — AB (ref 3.87–5.11)
RDW: 13.8 % (ref 11.5–15.5)
WBC: 4 10*3/uL (ref 4.0–10.5)

## 2017-05-10 LAB — POC URINALSYSI DIPSTICK (AUTOMATED)
Bilirubin, UA: NEGATIVE
Glucose, UA: NEGATIVE
Ketones, UA: NEGATIVE
Leukocytes, UA: NEGATIVE
NITRITE UA: NEGATIVE
PH UA: 6 (ref 5.0–8.0)
Protein, UA: NEGATIVE
RBC UA: NEGATIVE
Spec Grav, UA: >=1.03 — AB (ref 1.010–1.025)
UROBILINOGEN UA: 0.2 U/dL

## 2017-05-10 LAB — BRAIN NATRIURETIC PEPTIDE: Pro B Natriuretic peptide (BNP): 289 pg/mL — ABNORMAL HIGH (ref 0.0–100.0)

## 2017-05-10 NOTE — Patient Instructions (Signed)
Please stop by lab before you go- we are going to look into causes for swelling in your legs. I would elevate the legs as much as possible. If labs are ok- can try some over the counter compression stockings  Please let me know if you change your mind about signing pain contract. I hope you can find someone that can help you. Neurology may have other options for your headache as well outside of topamax

## 2017-05-10 NOTE — Addendum Note (Signed)
Addended by: [name], STEPHEN O on: 05/10/2017 01:28 PM   Modules accepted: Orders

## 2017-05-10 NOTE — Telephone Encounter (Signed)
-----   Message from Shelva MajesticStephen O Hunter, MD sent at 05/10/2017  1:30 PM EDT ----- [place] S[place] and Dr. Anne HahnWillis,   Patient would not sign pain contract with me today for tramadol. Also was using 3x a day instead of once a day as we had discussed (had mentioned BID for most severe headaches). I discussed with her not illegally taking medication such as a family members.   She also declined prophylactics stating she did not want to take a "daily medicine" yet was taking tramadol 3x a day. I told her if she changes her mind and will abide by not taking others meds and sign contract that I would be willing to prescribe. She left without signing but understands.   Thanks for all your help with her,  Tana ConchStephen Hunter

## 2017-05-10 NOTE — Telephone Encounter (Signed)
Message from Dr. Durene CalHunter was received, the patient should not get controlled substance is through this office.

## 2017-05-10 NOTE — Assessment & Plan Note (Addendum)
S: Patient with continued headaches. States was getting more frequent and had to go up to 3x a day tramadol. We had agreed max 2 a day previously. Apparently she may have taken some friends pain medication in the past- we discussed how this is illegal.  A/P: I told patient if she would agree not to take others pain medicines and sign pain contract I would write for tramadol btu she declines signing pain contract worried it will not allow her to get medicine from another provider- I explained I would have a conversation with that doctor if that happens and she declines. States she would rather be in pain. Reviewed [facility] and through last august has received total of #330 tramadol total and all from me.   States effexor helps her some and will continue that.   She also declines for now other prophylactics not wanting to take daily medication but I discussed with her tramadol 3x a day is certainly a daily medication and we may be able to reduce that. Declines follow up with neurology for now.   I told her if she changes her mind to let us know but that I could not prescribe the medication without pain contract.

## 2017-05-10 NOTE — Progress Notes (Addendum)
Subjective:  [name] is a 71 y.o. year old very pleasant female patient who presents for/with See problem oriented charting ROS- no chest pain or shortness of breath. No abnormal sweating. Has had some increased edema for 3 days.    Past Medical History-  Patient Active Problem List   Diagnosis Date Noted  . Migraine 09/19/2014    Priority: High  . CAD (coronary artery disease)     Priority: High  . Seizures (HCC)     Priority: High  . Overactive bladder 09/19/2014    Priority: Medium  . Hyperglycemia 09/19/2014    Priority: Medium  . Hypercholesteremia     Priority: Medium  . GERD (gastroesophageal reflux disease) 10/04/2011    Priority: Medium  . HTN (hypertension) 10/01/2011    Priority: Medium  . Hypokalemia 06/01/2014    Priority: Low  . Arthritis     Priority: Low    Medications- reviewed and updated Current Outpatient Prescriptions  Medication Sig Dispense Refill  . ACAI PO Take 1 capsule by mouth daily.    Marland Kitchen acetaminophen (TYLENOL) 325 MG tablet Take 650 mg by mouth every 6 (six) hours as needed.    . Aloe Vera 25 MG CAPS Take 1 capsule by mouth daily.    Marland Kitchen amLODipine (NORVASC) 10 MG tablet take 1 tablet by mouth once daily 30 tablet 5  . aspirin EC 81 MG EC tablet Take 1 tablet (81 mg total) by mouth daily.    Marland Kitchen atorvastatin (LIPITOR) 20 MG tablet take 1 tablet by mouth once daily 90 tablet 1  . Bee Pollen 550 MG CAPS Take 1 capsule by mouth daily.    . carvedilol (COREG) 6.25 MG tablet take 1 tablet by mouth twice a day with meals 60 tablet 5  . cholecalciferol (VITAMIN D) 400 UNITS TABS tablet Take 400 Units by mouth daily.    . Cyanocobalamin (VITAMIN B 12 PO) Take 1 capsule by mouth daily.    Marland Kitchen esomeprazole (NEXIUM) 40 MG capsule Take 1 capsule (40 mg total) by mouth daily. 90 capsule 3  . ezetimibe (ZETIA) 10 MG tablet take 1 tablet by mouth once daily 30 tablet 5  . isosorbide mononitrate (IMDUR) 60 MG 24 hr tablet take 2 tablets by mouth once daily 60  tablet 5  . lisinopril (PRINIVIL,ZESTRIL) 20 MG tablet take 1 tablet by mouth once daily 90 tablet 1  . nitroGLYCERIN (NITROSTAT) 0.4 MG SL tablet place 1 tablet under the tongue if needed every 5 minutes for chest pain for 3 doses 25 tablet 1  . oxybutynin (DITROPAN-XL) 10 MG 24 hr tablet Take 1 tablet (10 mg total) by mouth daily. 90 tablet 3  . potassium chloride (KLOR-CON 10) 10 MEQ tablet Take 1 tablet (10 mEq total) by mouth 2 (two) times daily. 60 tablet 5  . traMADol (ULTRAM) 50 MG tablet take 1 tablet by mouth up to once daily for migraines 90 tablet 1  . venlafaxine XR (EFFEXOR-XR) 150 MG 24 hr capsule take 1 capsule by mouth at bedtime 30 capsule 5  . vitamin C (ASCORBIC ACID) 500 MG tablet Take 500 mg by mouth daily.    . vitamin E (VITAMIN E) 1000 UNIT capsule Take 1,000 Units by mouth daily.     Objective: BP 128/64 (BP Location: Left Arm, Patient Position: Sitting, Cuff Size: Normal)   Pulse 83   Temp 98.5 F (36.9 C) (Oral)   Ht 5\' 6"  (1.676 m)   Wt 138 lb 3.2 oz (  62.7 kg)   SpO2 96%   BMI 22.31 kg/m  Gen: NAD, resting comfortably CV: RRR no murmurs rubs or gallops Lungs: CTAB no crackles, wheeze, rhonchi Abdomen: soft/nontender/nondistended/normal bowel sounds. No rebound or guarding.  Ext: trace edema Skin: warm, dry  Assessment/Plan:  Edema, unspecified type - Plan: CBC with Differential/Platelet, Comprehensive metabolic panel, POCT Urinalysis Dipstick (Automated), Brain Natriuretic Peptide, TSH  Migraine with aura and without status migrainosus, not intractable S: Patient reports 3 days of swelling in her ankles. States has swollen in past but then usually goes right back down if she cuts down salt. Did that this time but issue has persisted. No pain. Taking her regular medications A/P: minimal edema on exam pretibial area. we agreed to bloodwork as noted under edema. Discussed if workup negative would try OTC compression hose. She can begin elevating legs today.    Migraine S: Patient with continued headaches. States was getting more frequent and had to go up to 3x a day tramadol. We had agreed max 2 a day previously. Apparently she may have taken some friends pain medication in the past- we discussed how this is illegal.  A/P: I told patient if she would agree not to take others pain medicines and sign pain contract I would write for tramadol btu she declines signing pain contract worried it will not allow her to get medicine from another provider- I explained I would have a conversation with that doctor if that happens and she declines. States she would rather be in pain. Reviewed [facility] and through last august has received total of #330 tramadol total and all from me.   States effexor helps her some and will continue that.   She also declines for now other prophylactics not wanting to take daily medication but I discussed with her tramadol 3x a day is certainly a daily medication and we may be able to reduce that. Declines follow up with neurology for now.   I told her if she changes her mind to let us know but that I could not prescribe the medication without pain contract.     Orders Placed This Encounter  Procedures  . CBC with Differential/Platelet  . Comprehensive metabolic panel    Parkin  . Brain Natriuretic Peptide  . TSH      . POCT Urinalysis Dipstick (Automated)    Meds ordered this encounter  Medications  . DISCONTD: potassium chloride (MICRO-K) 10 MEQ CR capsule    Sig: Take 2 capsules by mouth daily.    Refill:  0    Return precautions advised.  Tana ConchStephen Waris Rodger, MD

## 2017-05-11 ENCOUNTER — Other Ambulatory Visit: Payer: Self-pay

## 2017-05-11 DIAGNOSIS — D649 Anemia, unspecified: Secondary | ICD-10-CM

## 2017-05-11 DIAGNOSIS — E0590 Thyrotoxicosis, unspecified without thyrotoxic crisis or storm: Secondary | ICD-10-CM

## 2017-05-18 ENCOUNTER — Other Ambulatory Visit (INDEPENDENT_AMBULATORY_CARE_PROVIDER_SITE_OTHER): Payer: Medicare HMO

## 2017-05-18 DIAGNOSIS — D649 Anemia, unspecified: Secondary | ICD-10-CM

## 2017-05-18 DIAGNOSIS — E0590 Thyrotoxicosis, unspecified without thyrotoxic crisis or storm: Secondary | ICD-10-CM

## 2017-05-18 LAB — CBC
HEMATOCRIT: 30.5 % — AB (ref 36.0–46.0)
HEMOGLOBIN: 10 g/dL — AB (ref 12.0–15.0)
MCHC: 33 g/dL (ref 30.0–36.0)
MCV: 93.2 fl (ref 78.0–100.0)
Platelets: 234 10*3/uL (ref 150.0–400.0)
RBC: 3.27 Mil/uL — ABNORMAL LOW (ref 3.87–5.11)
RDW: 13.6 % (ref 11.5–15.5)
WBC: 4.2 10*3/uL (ref 4.0–10.5)

## 2017-05-18 LAB — T4, FREE: Free T4: 3.64 ng/dL — ABNORMAL HIGH (ref 0.60–1.60)

## 2017-05-18 LAB — T3, FREE: T3, Free: 12.8 pg/mL — ABNORMAL HIGH (ref 2.3–4.2)

## 2017-05-18 LAB — TSH: TSH: <0.01 u[IU]/mL — ABNORMAL LOW (ref 0.35–4.50)

## 2017-05-20 ENCOUNTER — Telehealth: Payer: Self-pay | Admitting: Family Medicine

## 2017-05-20 NOTE — Telephone Encounter (Signed)
Patient states she would like to receive a call regarding her labs she had performed on Tuesday.

## 2017-05-21 ENCOUNTER — Encounter (HOSPITAL_COMMUNITY): Payer: Self-pay | Admitting: *Deleted

## 2017-05-21 ENCOUNTER — Emergency Department (HOSPITAL_COMMUNITY): Payer: Medicare HMO

## 2017-05-21 ENCOUNTER — Other Ambulatory Visit: Payer: Self-pay

## 2017-05-21 ENCOUNTER — Telehealth: Payer: Self-pay | Admitting: Family Medicine

## 2017-05-21 ENCOUNTER — Emergency Department (HOSPITAL_COMMUNITY)
Admission: EM | Admit: 2017-05-21 | Discharge: 2017-05-21 | Disposition: A | Discharge status: Home / Self Care | Payer: Medicare HMO | Source: Home / Self Care | Attending: Emergency Medicine | Admitting: Emergency Medicine

## 2017-05-21 DIAGNOSIS — D649 Anemia, unspecified: Secondary | ICD-10-CM

## 2017-05-21 DIAGNOSIS — I10 Essential (primary) hypertension: Secondary | ICD-10-CM | POA: Diagnosis present

## 2017-05-21 DIAGNOSIS — Z882 Allergy status to sulfonamides status: Secondary | ICD-10-CM | POA: Diagnosis not present

## 2017-05-21 DIAGNOSIS — I248 Other forms of acute ischemic heart disease: Secondary | ICD-10-CM | POA: Diagnosis not present

## 2017-05-21 DIAGNOSIS — E7800 Pure hypercholesterolemia, unspecified: Secondary | ICD-10-CM | POA: Diagnosis present

## 2017-05-21 DIAGNOSIS — E0590 Thyrotoxicosis, unspecified without thyrotoxic crisis or storm: Secondary | ICD-10-CM

## 2017-05-21 DIAGNOSIS — Z79899 Other long term (current) drug therapy: Secondary | ICD-10-CM

## 2017-05-21 DIAGNOSIS — R079 Chest pain, unspecified: Secondary | ICD-10-CM | POA: Diagnosis not present

## 2017-05-21 DIAGNOSIS — I252 Old myocardial infarction: Secondary | ICD-10-CM | POA: Diagnosis not present

## 2017-05-21 DIAGNOSIS — M1990 Unspecified osteoarthritis, unspecified site: Secondary | ICD-10-CM | POA: Diagnosis present

## 2017-05-21 DIAGNOSIS — R7989 Other specified abnormal findings of blood chemistry: Secondary | ICD-10-CM | POA: Diagnosis not present

## 2017-05-21 DIAGNOSIS — Z7982 Long term (current) use of aspirin: Secondary | ICD-10-CM | POA: Diagnosis not present

## 2017-05-21 DIAGNOSIS — E039 Hypothyroidism, unspecified: Secondary | ICD-10-CM | POA: Diagnosis not present

## 2017-05-21 DIAGNOSIS — I2510 Atherosclerotic heart disease of native coronary artery without angina pectoris: Secondary | ICD-10-CM

## 2017-05-21 DIAGNOSIS — K625 Hemorrhage of anus and rectum: Secondary | ICD-10-CM

## 2017-05-21 DIAGNOSIS — M545 Low back pain: Secondary | ICD-10-CM | POA: Diagnosis present

## 2017-05-21 DIAGNOSIS — R195 Other fecal abnormalities: Secondary | ICD-10-CM | POA: Diagnosis not present

## 2017-05-21 DIAGNOSIS — E785 Hyperlipidemia, unspecified: Secondary | ICD-10-CM | POA: Diagnosis present

## 2017-05-21 DIAGNOSIS — I25119 Atherosclerotic heart disease of native coronary artery with unspecified angina pectoris: Secondary | ICD-10-CM | POA: Diagnosis not present

## 2017-05-21 DIAGNOSIS — E440 Moderate protein-calorie malnutrition: Secondary | ICD-10-CM | POA: Diagnosis present

## 2017-05-21 DIAGNOSIS — Z6820 Body mass index (BMI) 20.0-20.9, adult: Secondary | ICD-10-CM | POA: Diagnosis not present

## 2017-05-21 DIAGNOSIS — E061 Subacute thyroiditis: Secondary | ICD-10-CM | POA: Diagnosis present

## 2017-05-21 DIAGNOSIS — R51 Headache: Secondary | ICD-10-CM | POA: Diagnosis not present

## 2017-05-21 DIAGNOSIS — R531 Weakness: Secondary | ICD-10-CM | POA: Diagnosis not present

## 2017-05-21 DIAGNOSIS — G8929 Other chronic pain: Secondary | ICD-10-CM | POA: Diagnosis present

## 2017-05-21 DIAGNOSIS — I119 Hypertensive heart disease without heart failure: Secondary | ICD-10-CM

## 2017-05-21 DIAGNOSIS — R0602 Shortness of breath: Secondary | ICD-10-CM | POA: Diagnosis not present

## 2017-05-21 DIAGNOSIS — Z96651 Presence of right artificial knee joint: Secondary | ICD-10-CM | POA: Diagnosis present

## 2017-05-21 DIAGNOSIS — I340 Nonrheumatic mitral (valve) insufficiency: Secondary | ICD-10-CM | POA: Diagnosis not present

## 2017-05-21 DIAGNOSIS — Z955 Presence of coronary angioplasty implant and graft: Secondary | ICD-10-CM | POA: Diagnosis not present

## 2017-05-21 DIAGNOSIS — R0789 Other chest pain: Secondary | ICD-10-CM | POA: Diagnosis not present

## 2017-05-21 DIAGNOSIS — K219 Gastro-esophageal reflux disease without esophagitis: Secondary | ICD-10-CM | POA: Diagnosis present

## 2017-05-21 DIAGNOSIS — I21A1 Myocardial infarction type 2: Secondary | ICD-10-CM | POA: Diagnosis present

## 2017-05-21 DIAGNOSIS — I214 Non-ST elevation (NSTEMI) myocardial infarction: Secondary | ICD-10-CM | POA: Diagnosis not present

## 2017-05-21 DIAGNOSIS — R748 Abnormal levels of other serum enzymes: Secondary | ICD-10-CM | POA: Diagnosis not present

## 2017-05-21 DIAGNOSIS — R9439 Abnormal result of other cardiovascular function study: Secondary | ICD-10-CM | POA: Diagnosis not present

## 2017-05-21 DIAGNOSIS — Z833 Family history of diabetes mellitus: Secondary | ICD-10-CM | POA: Diagnosis not present

## 2017-05-21 DIAGNOSIS — D638 Anemia in other chronic diseases classified elsewhere: Secondary | ICD-10-CM | POA: Diagnosis not present

## 2017-05-21 DIAGNOSIS — Z90710 Acquired absence of both cervix and uterus: Secondary | ICD-10-CM | POA: Diagnosis not present

## 2017-05-21 DIAGNOSIS — N390 Urinary tract infection, site not specified: Secondary | ICD-10-CM | POA: Diagnosis present

## 2017-05-21 DIAGNOSIS — E876 Hypokalemia: Secondary | ICD-10-CM | POA: Diagnosis present

## 2017-05-21 DIAGNOSIS — Z8249 Family history of ischemic heart disease and other diseases of the circulatory system: Secondary | ICD-10-CM | POA: Diagnosis not present

## 2017-05-21 DIAGNOSIS — Z8719 Personal history of other diseases of the digestive system: Secondary | ICD-10-CM | POA: Diagnosis not present

## 2017-05-21 LAB — CBC
HCT: 30.3 % — ABNORMAL LOW (ref 36.0–46.0)
HEMOGLOBIN: 9.9 g/dL — AB (ref 12.0–15.0)
MCH: 29.3 pg (ref 26.0–34.0)
MCHC: 32.7 g/dL (ref 30.0–36.0)
MCV: 89.6 fL (ref 78.0–100.0)
Platelets: 276 10*3/uL (ref 150–400)
RBC: 3.38 MIL/uL — AB (ref 3.87–5.11)
RDW: 13.8 % (ref 11.5–15.5)
WBC: 3.6 10*3/uL — ABNORMAL LOW (ref 4.0–10.5)

## 2017-05-21 LAB — COMPREHENSIVE METABOLIC PANEL
ALBUMIN: 3.1 g/dL — AB (ref 3.5–5.0)
ALK PHOS: 50 U/L (ref 38–126)
ALT: 28 U/L (ref 14–54)
ANION GAP: 8 (ref 5–15)
AST: 40 U/L (ref 15–41)
BUN: 13 mg/dL (ref 6–20)
CHLORIDE: 106 mmol/L (ref 101–111)
CO2: 26 mmol/L (ref 22–32)
CREATININE: 0.83 mg/dL (ref 0.44–1.00)
Calcium: 10.1 mg/dL (ref 8.9–10.3)
GFR calc non Af Amer: >60 mL/min (ref >60)
GLUCOSE: 117 mg/dL — AB (ref 65–99)
Potassium: 3.2 mmol/L — ABNORMAL LOW (ref 3.5–5.1)
SODIUM: 140 mmol/L (ref 135–145)
Total Bilirubin: 1 mg/dL (ref 0.3–1.2)
Total Protein: 6.3 g/dL — ABNORMAL LOW (ref 6.5–8.1)

## 2017-05-21 LAB — ABO/RH: ABO/RH(D): O POS

## 2017-05-21 LAB — TYPE AND SCREEN
ABO/RH(D): O POS
Antibody Screen: NEGATIVE

## 2017-05-21 LAB — POC OCCULT BLOOD, ED: FECAL OCCULT BLD: NEGATIVE

## 2017-05-21 MED ORDER — FERROUS SULFATE 325 (65 FE) MG PO TABS: 325.0000 mg | ORAL_TABLET | Freq: Every day | ORAL | 0 refills | Status: DC

## 2017-05-21 MED ORDER — SODIUM CHLORIDE 0.9 % IV BOLUS (SEPSIS)
1000.0000 mL | Freq: Once | INTRAVENOUS | Status: AC
  Administered 2017-05-21: 1000 mL via INTRAVENOUS

## 2017-05-21 MED ORDER — SODIUM CHLORIDE 0.9 % IV SOLN: INTRAVENOUS | Status: DC

## 2017-05-21 MED ORDER — HYDROCORTISONE ACETATE 25 MG RE SUPP: 25.0000 mg | Freq: Two times a day (BID) | RECTAL | 0 refills | Status: DC

## 2017-05-21 MED ORDER — ACETAMINOPHEN 500 MG PO TABS
1000.0000 mg | ORAL_TABLET | Freq: Once | ORAL | Status: AC
  Administered 2017-05-21: 1000 mg via ORAL
  Filled 2017-05-21: qty 2

## 2017-05-21 NOTE — ED Provider Notes (Signed)
[facility]-EMERGENCY DEPT Provider Note   CSN: [id number] Arrival date & time: 05/21/17  1059     History   Chief Complaint Chief Complaint  Patient presents with  . Gait Problem  . GI Bleeding    HPI [name] is a 71 y.o. female.  Pt presents to the ED today with gi bleeding.  Pt said it has been going on for months.  She said it's been worse in the past few days.  The pt also reports headache and unsteady gait.  She said it feels like she's drunk, but she does not drink.  The pt has a hx of headaches and has seen neurology for them.  She saw her pcp on 7/16 for headaches.  Her doctor did labs on that visit and again on 7/24.  She was anemic, but hgb unchanged.  The pt was referred to GI.  The pt's TSH was <0.01.  They referred her to endocrine, but will start treatment if she is unable to get in soon.        Past Medical History:  Diagnosis Date  . Arthritis   . Chronic lower back pain   . Cluster headache   . Coronary artery disease    a. 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 non obs dz [facility] 2007. c. Non obs dz (mild-mod) by [facility] 05/17/14.   . Diverticulitis    a. Hx microperf 2012 - hospitalizated.  Marland Kitchen GERD (gastroesophageal reflux disease)   . Hypercholesteremia   . Hypertension   . PVC's (premature ventricular contractions)    a. Hx of trigeminy/bigeminy.  . Seizures (HCC)   . UTI (lower urinary tract infection)     Patient Active Problem List   Diagnosis Date Noted  . Overactive bladder 09/19/2014  . Migraine 09/19/2014  . Hyperglycemia 09/19/2014  . Hypokalemia 06/01/2014  . CAD (coronary artery disease)   . Seizures (HCC)   . Arthritis   . Hypercholesteremia   . GERD (gastroesophageal reflux disease) 10/04/2011  . HTN (hypertension) 10/01/2011    Past Surgical History:  Procedure Laterality Date  . ABDOMINAL HYSTERECTOMY  1987   "partial"-still has ovaries  . CARDIAC CATHETERIZATION  05/17/2014  . CORONARY ANGIOPLASTY WITH STENT PLACEMENT  1995   "1"  . JOINT  REPLACEMENT     right knee  . LEFT HEART CATHETERIZATION WITH CORONARY ANGIOGRAM N/A 05/17/2014   Procedure: LEFT HEART CATHETERIZATION WITH CORONARY ANGIOGRAM;  Surgeon: Marykay Lex, MD;  Location[facility] C[facility] LAB;  Service: Cardiovascular;  Laterality: N/A;  . TOTAL KNEE ARTHROPLASTY Right 2005  . TUBAL LIGATION  1970  . VASCULAR SURGERY      OB History    No data available       Home Medications    Prior to Admission medications   Medication Sig Start Date End Date Taking? Authorizing Provider  ACAI PO Take 1 capsule by mouth daily.   Yes [provider]  acetaminophen (TYLENOL) 325 MG tablet Take 650 mg by mouth every 6 (six) hours as needed for moderate pain.    Yes [provider]  Aloe Vera 25 MG CAPS Take 25 mg by mouth daily.    Yes [provider]  amLODipine (NORVASC) 10 MG tablet take 1 tablet by mouth once daily Patient taking differently: take 10 mg by mouth once daily 12/07/16  Yes Shelva Majestic, MD  aspirin 325 MG tablet Take 325 mg by mouth 3 (three) times a week.    Yes [provider]  atorvastatin (LIPITOR) 20 MG tablet take 1 tablet by mouth once daily Patient taking differently: take 20 mg by mouth once daily 05/06/17  Yes Shelva Majestic, MD  Bee Pollen 550 MG CAPS Take 550 mg by mouth daily.    Yes [provider]  carvedilol (COREG) 6.25 MG tablet take 1 tablet by mouth twice a day with meals Patient taking differently: take 6.25 mg by mouth twice a day with meals 02/10/17  Yes Shelva Majestic, MD  cholecalciferol (VITAMIN D) 400 UNITS TABS tablet Take 400 Units by mouth every other day.    Yes [provider]  Cyanocobalamin (VITAMIN B 12 PO) Take 1 capsule by mouth every other day.    Yes [provider]  esomeprazole (NEXIUM) 40 MG capsule Take 1 capsule (40 mg total) by mouth daily. Patient taking differently: Take 40 mg by mouth 2 (two) times daily.  11/19/16  Yes Shelva Majestic, MD    ezetimibe (ZETIA) 10 MG tablet take 1 tablet by mouth once daily Patient taking differently: take 10 mg by mouth once daily 01/11/17  Yes Shelva Majestic, MD  isosorbide mononitrate (IMDUR) 60 MG 24 hr tablet take 2 tablets by mouth once daily Patient taking differently: take 120 mg by mouth once daily 01/11/17  Yes Shelva Majestic, MD  lisinopril (PRINIVIL,ZESTRIL) 20 MG tablet take 1 tablet by mouth once daily Patient taking differently: take 20 mg by mouth once daily 04/05/17  Yes Shelva Majestic, MD  oxybutynin (DITROPAN-XL) 10 MG 24 hr tablet Take 1 tablet (10 mg total) by mouth daily. 11/19/16  Yes Shelva Majestic, MD  potassium chloride (KLOR-CON 10) 10 MEQ tablet Take 1 tablet (10 mEq total) by mouth 2 (two) times daily. 08/11/16  Yes Shelva Majestic, MD  traMADol (ULTRAM) 50 MG tablet take 1 tablet by mouth up to once daily for migraines Patient taking differently: Take 50-150 mg by mouth every 6 (six) hours as needed for moderate pain.  11/19/16  Yes Shelva Majestic, MD  venlafaxine XR (EFFEXOR-XR) 150 MG 24 hr capsule take 1 capsule by mouth at bedtime Patient taking differently: take 150 mg by mouth at bedtime 12/07/16  Yes Shelva Majestic, MD  vitamin C (ASCORBIC ACID) 500 MG tablet Take 500 mg by mouth every other day.    Yes [provider]  vitamin E (VITAMIN E) 1000 UNIT capsule Take 1,000 Units by mouth every other day.    Yes [provider]  aspirin EC 81 MG EC tablet Take 1 tablet (81 mg total) by mouth daily. Patient not taking: Reported on 05/21/2017 05/18/14   Janetta Hora, PA-C  ferrous sulfate 325 (65 FE) MG tablet Take 1 tablet (325 mg total) by mouth daily. 05/21/17   Jacalyn Lefevre, MD  hydrocortisone (ANUSOL-HC) 25 MG suppository Place 1 suppository (25 mg total) rectally 2 (two) times daily. 05/21/17   Jacalyn Lefevre, MD  nitroGLYCERIN (NITROSTAT) 0.4 MG SL tablet place 1 tablet under the tongue if needed every 5 minutes for chest pain  for 3 doses Patient taking differently: place 0.4 mg under the tongue if needed every 5 minutes for chest pain for 3 doses 09/16/16   Shelva Majestic, MD    Family History Family History  Problem Relation Age of Onset  . CAD Brother   . Diabetes Brother   . CAD Father   . Diabetes Father   . Diabetes Mother  father, sister, brothers  . Diabetes Sister     Social History Social History  Substance Use Topics  . Smoking status: Never Smoker  . Smokeless tobacco: Never Used  . Alcohol use No     Allergies   Sulfa drugs cross reactors   Review of Systems Review of Systems  Gastrointestinal: Positive for anal bleeding.  Neurological: Positive for headaches.  All other systems reviewed and are negative.    Physical Exam Updated Vital Signs BP 120/61 (BP Location: Right Arm)   Pulse 99   Temp 98.4 F (36.9 C) (Oral)   Resp 20   Ht 5\' 6"  (1.676 m)   Wt 61.2 kg (135 lb)   SpO2 100%   BMI 21.79 kg/m   Physical Exam  Constitutional: She is oriented to person, place, and time. She appears well-developed and well-nourished.  HENT:  Head: Normocephalic and atraumatic.  Right Ear: External ear normal.  Left Ear: External ear normal.  Nose: Nose normal.  Mouth/Throat: Oropharynx is clear and moist.  Eyes: Pupils are equal, round, and reactive to light. Conjunctivae and EOM are normal.  Neck: Normal range of motion. Neck supple.  Cardiovascular: Normal rate, regular rhythm, normal heart sounds and intact distal pulses.   Pulmonary/Chest: Effort normal and breath sounds normal.  Abdominal: Soft. Bowel sounds are normal.  Genitourinary: Rectal exam shows no external hemorrhoid, no fissure, no mass, no tenderness, anal tone normal and guaiac negative stool.  Musculoskeletal: Normal range of motion.  Neurological: She is alert and oriented to person, place, and time.  Skin: Skin is warm.  Psychiatric: She has a normal mood and affect. Her behavior is normal.  Judgment and thought content normal.  Nursing note and vitals reviewed.    ED Treatments / Results  Labs (all labs ordered are listed, but only abnormal results are displayed) Labs Reviewed  COMPREHENSIVE METABOLIC PANEL - Abnormal; Notable for the following:       Result Value   Potassium 3.2 (*)    Glucose, Bld 117 (*)    Total Protein 6.3 (*)    Albumin 3.1 (*)    All other components within normal limits  CBC - Abnormal; Notable for the following:    WBC 3.6 (*)    RBC 3.38 (*)    Hemoglobin 9.9 (*)    HCT 30.3 (*)    All other components within normal limits  POC OCCULT BLOOD, ED  TYPE AND SCREEN  ABO/RH    EKG  EKG Interpretation None       Radiology Ct Head Wo Contrast  Result Date: 05/21/2017 CLINICAL DATA:  Unsteady gait.  Headache. EXAM: CT HEAD WITHOUT CONTRAST TECHNIQUE: Contiguous axial images were obtained from the base of the skull through the vertex without intravenous contrast. COMPARISON:  09/10/2011. FINDINGS: Brain: No evidence of acute infarction, hemorrhage, hydrocephalus, extra-axial collection or mass lesion/mass effect. Vascular: No hyperdense vessel or unexpected calcification. Skull: Stable right lateral skull osteoma. Sinuses/Orbits: Unremarkable. Other: None. IMPRESSION: No acute abnormality. Electronically Signed   By: Beckie SaltsSteven  Reid M.D.   On: 05/21/2017 12:38    Procedures Procedures (including critical care time)  Medications Ordered in ED Medications  sodium chloride 0.9 % bolus 1,000 mL (1,000 mLs Intravenous New Bag/Given 05/21/17 1304)    And  0.9 %  sodium chloride infusion (not administered)     Initial Impression / Assessment and Plan / ED Course  I have reviewed the triage vital signs and the nursing notes.  Pertinent labs &  imaging results that were available during my care of the patient were reviewed by me and considered in my medical decision making (see chart for details).    Pt's sx seem chronic.  Pt's hemoglobin has  been stable for 2 weeks.  No active bleeding here.  The pt's pcp has already referred her to gi and to endocrinology.  Pt needs to f/u.  She knows to return if worse.  Final Clinical Impressions(s) / ED Diagnoses   Final diagnoses:  Rectal bleeding  Hyperthyroidism  Chronic anemia    New Prescriptions New Prescriptions   FERROUS SULFATE 325 (65 FE) MG TABLET    Take 1 tablet (325 mg total) by mouth daily.   HYDROCORTISONE (ANUSOL-HC) 25 MG SUPPOSITORY    Place 1 suppository (25 mg total) rectally 2 (two) times daily.     Jacalyn LefevreHaviland, Bardia Wangerin, MD 05/21/17 1414

## 2017-05-21 NOTE — Telephone Encounter (Signed)
New patient appointment needing to be scheduled. Please call patient and advise.

## 2017-05-21 NOTE — ED Notes (Signed)
Got patient ready for discharge 

## 2017-05-21 NOTE — ED Notes (Signed)
Patient transported to CT 

## 2017-05-21 NOTE — Telephone Encounter (Signed)
Called but was unable to reach patient. I did place referrals. See lab results

## 2017-05-21 NOTE — ED Triage Notes (Signed)
Pt c/o intermittent red blood in stool with black stools x 3 wks, pt c/o unsteady gait, generalized body aches, & headache, pt A&O x4, reports SOB

## 2017-05-22 ENCOUNTER — Encounter (HOSPITAL_COMMUNITY): Payer: Self-pay

## 2017-05-22 ENCOUNTER — Inpatient Hospital Stay (HOSPITAL_COMMUNITY)
Admission: EM | Admit: 2017-05-22 | Discharge: 2017-05-27 | DRG: 281 | Disposition: A | Discharge status: Home Health | Payer: Medicare HMO | Attending: Internal Medicine | Admitting: Internal Medicine

## 2017-05-22 ENCOUNTER — Emergency Department (HOSPITAL_COMMUNITY): Payer: Medicare HMO

## 2017-05-22 DIAGNOSIS — R9439 Abnormal result of other cardiovascular function study: Secondary | ICD-10-CM

## 2017-05-22 DIAGNOSIS — R0602 Shortness of breath: Secondary | ICD-10-CM

## 2017-05-22 DIAGNOSIS — I2510 Atherosclerotic heart disease of native coronary artery without angina pectoris: Secondary | ICD-10-CM | POA: Diagnosis present

## 2017-05-22 DIAGNOSIS — I248 Other forms of acute ischemic heart disease: Secondary | ICD-10-CM

## 2017-05-22 DIAGNOSIS — M545 Low back pain: Secondary | ICD-10-CM | POA: Diagnosis present

## 2017-05-22 DIAGNOSIS — Z833 Family history of diabetes mellitus: Secondary | ICD-10-CM

## 2017-05-22 DIAGNOSIS — R531 Weakness: Secondary | ICD-10-CM

## 2017-05-22 DIAGNOSIS — I081 Rheumatic disorders of both mitral and tricuspid valves: Secondary | ICD-10-CM | POA: Diagnosis present

## 2017-05-22 DIAGNOSIS — Z7982 Long term (current) use of aspirin: Secondary | ICD-10-CM

## 2017-05-22 DIAGNOSIS — I25119 Atherosclerotic heart disease of native coronary artery with unspecified angina pectoris: Secondary | ICD-10-CM | POA: Diagnosis present

## 2017-05-22 DIAGNOSIS — R778 Other specified abnormalities of plasma proteins: Secondary | ICD-10-CM | POA: Diagnosis present

## 2017-05-22 DIAGNOSIS — E7800 Pure hypercholesterolemia, unspecified: Secondary | ICD-10-CM | POA: Diagnosis present

## 2017-05-22 DIAGNOSIS — N390 Urinary tract infection, site not specified: Secondary | ICD-10-CM | POA: Diagnosis present

## 2017-05-22 DIAGNOSIS — R195 Other fecal abnormalities: Secondary | ICD-10-CM | POA: Diagnosis present

## 2017-05-22 DIAGNOSIS — Z90710 Acquired absence of both cervix and uterus: Secondary | ICD-10-CM

## 2017-05-22 DIAGNOSIS — R569 Unspecified convulsions: Secondary | ICD-10-CM | POA: Diagnosis present

## 2017-05-22 DIAGNOSIS — E0590 Thyrotoxicosis, unspecified without thyrotoxic crisis or storm: Secondary | ICD-10-CM | POA: Diagnosis present

## 2017-05-22 DIAGNOSIS — Z882 Allergy status to sulfonamides status: Secondary | ICD-10-CM

## 2017-05-22 DIAGNOSIS — M1990 Unspecified osteoarthritis, unspecified site: Secondary | ICD-10-CM | POA: Diagnosis present

## 2017-05-22 DIAGNOSIS — Z96651 Presence of right artificial knee joint: Secondary | ICD-10-CM | POA: Diagnosis present

## 2017-05-22 DIAGNOSIS — I10 Essential (primary) hypertension: Secondary | ICD-10-CM | POA: Diagnosis present

## 2017-05-22 DIAGNOSIS — E061 Subacute thyroiditis: Secondary | ICD-10-CM | POA: Diagnosis present

## 2017-05-22 DIAGNOSIS — R7989 Other specified abnormal findings of blood chemistry: Secondary | ICD-10-CM | POA: Diagnosis present

## 2017-05-22 DIAGNOSIS — E440 Moderate protein-calorie malnutrition: Secondary | ICD-10-CM | POA: Diagnosis present

## 2017-05-22 DIAGNOSIS — Z955 Presence of coronary angioplasty implant and graft: Secondary | ICD-10-CM

## 2017-05-22 DIAGNOSIS — E785 Hyperlipidemia, unspecified: Secondary | ICD-10-CM | POA: Diagnosis present

## 2017-05-22 DIAGNOSIS — Z8249 Family history of ischemic heart disease and other diseases of the circulatory system: Secondary | ICD-10-CM

## 2017-05-22 DIAGNOSIS — I2489 Other forms of acute ischemic heart disease: Secondary | ICD-10-CM

## 2017-05-22 DIAGNOSIS — I21A1 Myocardial infarction type 2: Principal | ICD-10-CM | POA: Diagnosis present

## 2017-05-22 DIAGNOSIS — E876 Hypokalemia: Secondary | ICD-10-CM | POA: Diagnosis present

## 2017-05-22 DIAGNOSIS — G8929 Other chronic pain: Secondary | ICD-10-CM | POA: Diagnosis present

## 2017-05-22 DIAGNOSIS — I214 Non-ST elevation (NSTEMI) myocardial infarction: Secondary | ICD-10-CM | POA: Diagnosis present

## 2017-05-22 DIAGNOSIS — Z6820 Body mass index (BMI) 20.0-20.9, adult: Secondary | ICD-10-CM

## 2017-05-22 DIAGNOSIS — I252 Old myocardial infarction: Secondary | ICD-10-CM

## 2017-05-22 DIAGNOSIS — K219 Gastro-esophageal reflux disease without esophagitis: Secondary | ICD-10-CM | POA: Diagnosis present

## 2017-05-22 LAB — CBC WITH DIFFERENTIAL/PLATELET
Basophils Absolute: 0 10*3/uL (ref 0.0–0.1)
Basophils Relative: 1 %
EOS ABS: 0.1 10*3/uL (ref 0.0–0.7)
EOS PCT: 1 %
HCT: 34.9 % — ABNORMAL LOW (ref 36.0–46.0)
Hemoglobin: 11.5 g/dL — ABNORMAL LOW (ref 12.0–15.0)
Lymphocytes Relative: 35 %
Lymphs Abs: 1.5 10*3/uL (ref 0.7–4.0)
MCH: 29.4 pg (ref 26.0–34.0)
MCHC: 33 g/dL (ref 30.0–36.0)
MCV: 89.3 fL (ref 78.0–100.0)
MONO ABS: 0.6 10*3/uL (ref 0.1–1.0)
MONOS PCT: 13 %
NEUTROS PCT: 50 %
Neutro Abs: 2.2 10*3/uL (ref 1.7–7.7)
PLATELETS: 297 10*3/uL (ref 150–400)
RBC: 3.91 MIL/uL (ref 3.87–5.11)
RDW: 14 % (ref 11.5–15.5)
WBC: 4.2 10*3/uL (ref 4.0–10.5)

## 2017-05-22 LAB — URINALYSIS, ROUTINE W REFLEX MICROSCOPIC
BILIRUBIN URINE: NEGATIVE
Glucose, UA: NEGATIVE mg/dL
Hgb urine dipstick: NEGATIVE
Ketones, ur: 20 mg/dL — AB
Nitrite: NEGATIVE
Protein, ur: 30 mg/dL — AB
SPECIFIC GRAVITY, URINE: 1.018 (ref 1.005–1.030)
pH: 6 (ref 5.0–8.0)

## 2017-05-22 LAB — BRAIN NATRIURETIC PEPTIDE: B NATRIURETIC PEPTIDE 5: 900.5 pg/mL — AB (ref 0.0–100.0)

## 2017-05-22 LAB — COMPREHENSIVE METABOLIC PANEL
ALK PHOS: 59 U/L (ref 38–126)
ALT: 31 U/L (ref 14–54)
ANION GAP: 10 (ref 5–15)
AST: 36 U/L (ref 15–41)
Albumin: 3.5 g/dL (ref 3.5–5.0)
BUN: 10 mg/dL (ref 6–20)
CALCIUM: 10.6 mg/dL — AB (ref 8.9–10.3)
CHLORIDE: 104 mmol/L (ref 101–111)
CO2: 27 mmol/L (ref 22–32)
Creatinine, Ser: 0.67 mg/dL (ref 0.44–1.00)
GFR calc non Af Amer: >60 mL/min (ref >60)
GLUCOSE: 108 mg/dL — AB (ref 65–99)
POTASSIUM: 3.4 mmol/L — AB (ref 3.5–5.1)
SODIUM: 141 mmol/L (ref 135–145)
Total Bilirubin: 1.4 mg/dL — ABNORMAL HIGH (ref 0.3–1.2)
Total Protein: 7.3 g/dL (ref 6.5–8.1)

## 2017-05-22 LAB — I-STAT TROPONIN, ED
TROPONIN I, POC: 0.15 ng/mL — AB (ref 0.00–0.08)
TROPONIN I, POC: 0.12 ng/mL — AB (ref 0.00–0.08)

## 2017-05-22 LAB — PROTIME-INR
INR: 1.3
PROTHROMBIN TIME: 16.2 s — AB (ref 11.4–15.2)

## 2017-05-22 LAB — POC OCCULT BLOOD, ED: Fecal Occult Bld: NEGATIVE

## 2017-05-22 LAB — I-STAT CG4 LACTIC ACID, ED
LACTIC ACID, VENOUS: 2.18 mmol/L — AB (ref 0.5–1.9)
LACTIC ACID, VENOUS: 1.2 mmol/L (ref 0.5–1.9)

## 2017-05-22 LAB — MAGNESIUM: MAGNESIUM: 1.5 mg/dL — AB (ref 1.7–2.4)

## 2017-05-22 LAB — LIPASE, BLOOD: Lipase: 33 U/L (ref 11–51)

## 2017-05-22 LAB — TSH: TSH: <0.01 u[IU]/mL — ABNORMAL LOW (ref 0.350–4.500)

## 2017-05-22 LAB — D-DIMER, QUANTITATIVE (NOT AT ARMC): D DIMER QUANT: 1.42 ug{FEU}/mL — AB (ref 0.00–0.50)

## 2017-05-22 MED ORDER — VITAMIN C 500 MG PO TABS
500.0000 mg | ORAL_TABLET | Freq: Every day | ORAL | Status: DC
  Administered 2017-05-23 – 2017-05-27 (×5): 500 mg via ORAL
  Filled 2017-05-22 (×5): qty 1

## 2017-05-22 MED ORDER — ASPIRIN 81 MG PO CHEW: 81.0000 mg | CHEWABLE_TABLET | Freq: Once | ORAL | Status: DC

## 2017-05-22 MED ORDER — ATORVASTATIN CALCIUM 20 MG PO TABS
20.0000 mg | ORAL_TABLET | Freq: Every day | ORAL | Status: DC
  Administered 2017-05-23 – 2017-05-27 (×5): 20 mg via ORAL
  Filled 2017-05-22 (×5): qty 1

## 2017-05-22 MED ORDER — PANTOPRAZOLE SODIUM 40 MG PO TBEC
40.0000 mg | DELAYED_RELEASE_TABLET | Freq: Two times a day (BID) | ORAL | Status: DC
  Administered 2017-05-23 – 2017-05-27 (×9): 40 mg via ORAL
  Filled 2017-05-22 (×9): qty 1

## 2017-05-22 MED ORDER — ISOSORBIDE MONONITRATE ER 60 MG PO TB24
120.0000 mg | ORAL_TABLET | Freq: Every day | ORAL | Status: DC
  Administered 2017-05-23 – 2017-05-27 (×5): 120 mg via ORAL
  Filled 2017-05-22 (×5): qty 2

## 2017-05-22 MED ORDER — ASPIRIN 325 MG PO TABS
325.0000 mg | ORAL_TABLET | Freq: Every day | ORAL | Status: DC
  Administered 2017-05-22 – 2017-05-25 (×4): 325 mg via ORAL
  Filled 2017-05-22 (×4): qty 1

## 2017-05-22 MED ORDER — FERROUS SULFATE 325 (65 FE) MG PO TABS
325.0000 mg | ORAL_TABLET | Freq: Every day | ORAL | Status: DC
  Administered 2017-05-23 – 2017-05-27 (×4): 325 mg via ORAL
  Filled 2017-05-22 (×5): qty 1

## 2017-05-22 MED ORDER — MORPHINE SULFATE (PF) 4 MG/ML IV SOLN: 2.0000 mg | INTRAVENOUS | Status: DC | PRN

## 2017-05-22 MED ORDER — AMLODIPINE BESYLATE 10 MG PO TABS
10.0000 mg | ORAL_TABLET | Freq: Every day | ORAL | Status: DC
  Administered 2017-05-23 – 2017-05-27 (×5): 10 mg via ORAL
  Filled 2017-05-22 (×5): qty 1

## 2017-05-22 MED ORDER — ZOLPIDEM TARTRATE 5 MG PO TABS
5.0000 mg | ORAL_TABLET | Freq: Every evening | ORAL | Status: DC | PRN
  Administered 2017-05-23 – 2017-05-26 (×5): 5 mg via ORAL
  Filled 2017-05-22 (×5): qty 1

## 2017-05-22 MED ORDER — POTASSIUM CHLORIDE 20 MEQ/15ML (10%) PO SOLN
20.0000 meq | Freq: Once | ORAL | Status: AC
  Administered 2017-05-22: 20 meq via ORAL
  Filled 2017-05-22: qty 15

## 2017-05-22 MED ORDER — DEXTROSE 5 % IV SOLN
1.0000 g | Freq: Once | INTRAVENOUS | Status: AC
  Administered 2017-05-22: 1 g via INTRAVENOUS
  Filled 2017-05-22: qty 10

## 2017-05-22 MED ORDER — VITAMIN B-12 100 MCG PO TABS
100.0000 ug | ORAL_TABLET | ORAL | Status: DC
  Administered 2017-05-23 – 2017-05-27 (×3): 100 ug via ORAL
  Filled 2017-05-22 (×3): qty 1

## 2017-05-22 MED ORDER — TRAMADOL HCL 50 MG PO TABS
50.0000 mg | ORAL_TABLET | Freq: Four times a day (QID) | ORAL | Status: DC | PRN
Start: 2017-05-22 — End: 2017-05-27
  Administered 2017-05-23 – 2017-05-26 (×8): 50 mg via ORAL
  Filled 2017-05-22 (×8): qty 1

## 2017-05-22 MED ORDER — EZETIMIBE 10 MG PO TABS
10.0000 mg | ORAL_TABLET | Freq: Every day | ORAL | Status: DC
  Administered 2017-05-23 – 2017-05-27 (×5): 10 mg via ORAL
  Filled 2017-05-22 (×5): qty 1

## 2017-05-22 MED ORDER — ACAI 500 MG PO CAPS: 1.0000 | ORAL_CAPSULE | Freq: Every day | ORAL | Status: DC

## 2017-05-22 MED ORDER — ACETAMINOPHEN 325 MG PO TABS: 650.0000 mg | ORAL_TABLET | Freq: Four times a day (QID) | ORAL | Status: DC | PRN

## 2017-05-22 MED ORDER — SODIUM CHLORIDE 0.9 % IV BOLUS (SEPSIS)
1000.0000 mL | Freq: Once | INTRAVENOUS | Status: AC
  Administered 2017-05-22: 1000 mL via INTRAVENOUS

## 2017-05-22 MED ORDER — MAGNESIUM SULFATE 2 GM/50ML IV SOLN
2.0000 g | Freq: Once | INTRAVENOUS | Status: AC
  Administered 2017-05-22: 2 g via INTRAVENOUS
  Filled 2017-05-22: qty 50

## 2017-05-22 MED ORDER — CEFTRIAXONE SODIUM 1 G IJ SOLR
1.0000 g | INTRAMUSCULAR | Status: DC
  Administered 2017-05-23: 1 g via INTRAVENOUS
  Filled 2017-05-22: qty 10

## 2017-05-22 MED ORDER — ONDANSETRON HCL 4 MG/2ML IJ SOLN: 4.0000 mg | Freq: Three times a day (TID) | INTRAMUSCULAR | Status: DC | PRN

## 2017-05-22 MED ORDER — LISINOPRIL 20 MG PO TABS
20.0000 mg | ORAL_TABLET | Freq: Every day | ORAL | Status: DC
  Administered 2017-05-23 – 2017-05-27 (×5): 20 mg via ORAL
  Filled 2017-05-22 (×5): qty 1

## 2017-05-22 MED ORDER — ATENOLOL 50 MG PO TABS
50.0000 mg | ORAL_TABLET | Freq: Two times a day (BID) | ORAL | Status: DC
  Administered 2017-05-22 – 2017-05-27 (×10): 50 mg via ORAL
  Filled 2017-05-22 (×11): qty 1

## 2017-05-22 MED ORDER — ALOE VERA 25 MG PO CAPS: 25.0000 mg | ORAL_CAPSULE | Freq: Every day | ORAL | Status: DC

## 2017-05-22 MED ORDER — OXYBUTYNIN CHLORIDE ER 10 MG PO TB24
10.0000 mg | ORAL_TABLET | Freq: Every day | ORAL | Status: DC
  Administered 2017-05-23 – 2017-05-27 (×5): 10 mg via ORAL
  Filled 2017-05-22 (×5): qty 1

## 2017-05-22 MED ORDER — BEE POLLEN 550 MG PO CAPS: 550.0000 mg | ORAL_CAPSULE | Freq: Every day | ORAL | Status: DC

## 2017-05-22 MED ORDER — VITAMIN E 45 MG (100 UNIT) PO CAPS
1000.0000 [IU] | ORAL_CAPSULE | ORAL | Status: DC
  Administered 2017-05-23 – 2017-05-27 (×3): 1000 [IU] via ORAL
  Filled 2017-05-22 (×3): qty 2

## 2017-05-22 MED ORDER — HYDROCORTISONE ACETATE 25 MG RE SUPP
25.0000 mg | Freq: Two times a day (BID) | RECTAL | Status: DC
  Filled 2017-05-22 (×10): qty 1

## 2017-05-22 MED ORDER — ENOXAPARIN SODIUM 40 MG/0.4ML ~~LOC~~ SOLN: 40.0000 mg | SUBCUTANEOUS | Status: DC

## 2017-05-22 MED ORDER — HYDRALAZINE HCL 20 MG/ML IJ SOLN: 5.0000 mg | INTRAMUSCULAR | Status: DC | PRN

## 2017-05-22 MED ORDER — VENLAFAXINE HCL ER 75 MG PO CP24
150.0000 mg | ORAL_CAPSULE | Freq: Every day | ORAL | Status: DC
  Administered 2017-05-23 – 2017-05-26 (×5): 150 mg via ORAL
  Filled 2017-05-22: qty 2
  Filled 2017-05-22 (×2): qty 1
  Filled 2017-05-22: qty 2
  Filled 2017-05-22 (×2): qty 1
  Filled 2017-05-22 (×2): qty 2
  Filled 2017-05-22: qty 1

## 2017-05-22 MED ORDER — IOPAMIDOL (ISOVUE-370) INJECTION 76%
INTRAVENOUS | Status: AC
  Administered 2017-05-22: 100 mL via INTRAVENOUS
  Filled 2017-05-22: qty 100

## 2017-05-22 NOTE — ED Notes (Signed)
Pt sent to CT

## 2017-05-22 NOTE — ED Notes (Signed)
Pt give mouth swab.

## 2017-05-22 NOTE — ED Notes (Signed)
Patient transported to CT 

## 2017-05-22 NOTE — ED Notes (Signed)
Pt ambulated to restroom. 

## 2017-05-22 NOTE — H&P (Signed)
History and Physical    [name] ZOX:[id number] DOB: [birth year] DOA: 05/22/2017  Referring MD/NP/PA:   PCP: Shelva Majestic, MD   Patient coming from:  The patient is coming from home.  At baseline, pt is independent for most of ADL.  Chief Complaint: Generalized weakness, SOB, chest discomfort, neck pain, sore throat, dysuria, suprapubic abdominal pain.  HPI: [name] is a 71 y.o. female with medical history significant of hypertension, hyperlipidemia, GERD, diverticulitis, CAD, stent placement, back pain, PVC, GI bleeding, who presents with generalized weakness, SOB, chest discomfort, neck pain, sore throat, dysuria, suprapubic abdominal pain.  Patient states that she has not been feeling well in the past 3 mouth. She has multiple complaints, including generalized weakness, body aches, shortness of breath, chest discomfort, neck pain, sore throat, dysuria, suprapubic abdominal pain.  Patient states that she has shortness of breath intermittently, and mild chest discomfort sometimes. She has dry cough, no fever or chills. She has body aches, generalized weakness. Recently she has front neck pain, sore throat, but no runny nose. She has nausea, no vomiting or diarrhea. She has suprapubic abdominal pain, which is 2 out of 10 in severity, intermittent, nonradiating. She has intermittent dysuria, burning on urination and increased urinary frequency. She also has bilateral shoulder pain, but no swelling or redness in shoulders. No unilateral weakness. No hematochezia, hematemesis or hematuria. She states that she lost 20 pounds in the past 3 mouth. She reports that she was seen yesterday and was told that she has thyroid problems and possibly a GI bleed. She was told to follow-up with 2 different specialist, GI and endocrine doctor. Her TSH was<0.01 yesterday. Her free T3 was 12.8 and T4 3.64 on 05/18/17. Patient reports dark stool twice.   ED Course: pt was found to have troponin 0.12-->0.27,  negative FOBT, lactic acid 2.018, 122, INR 1.3, BNP 100.5, lipase 33, potassium 3.4, creatinine normal, urinalysis with small amount of leukocyte and many bacteria's, temperature normal, tachycardia, tachypnea, oxygen saturation 100% on room air, chest x-rays negative. CT angiogram of chest is negative for PE. Patient is placed on telemetry bed for observation.  Review of Systems:   General: no fevers, chills, has weigh loss, has poor appetite, has fatigue HEENT: no blurry vision, hearing changes or sore throat Respiratory: has dyspnea, coughing, no wheezing CV: has chest discomfort, no palpitations GI: has lower abdominal pain and nausea, no vomiting,  diarrhea, constipation GU: has dysuria, burning on urination, increased urinary frequency, no hematuria  Ext: no leg edema Neuro: no unilateral weakness, numbness, or tingling, no vision change or hearing loss Skin: no rash, no skin tear. MSK: No muscle spasm, no deformity, no limitation of range of movement in spin Heme: No easy bruising.  Travel history: No recent long distant travel.  Allergy:  Allergies  Allergen Reactions  . Sulfa Drugs Cross Reactors Shortness Of Breath and Palpitations    Past Medical History:  Diagnosis Date  . Arthritis   . Chronic lower back pain   . Cluster headache   . Coronary artery disease    a. 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 non obs dz LHC 2007. c. Non obs dz (mild-mod) by [facility] 05/17/14.   . Diverticulitis    a. Hx microperf 2012 - hospitalizated.  Marland Kitchen GERD (gastroesophageal reflux disease)   . Hypercholesteremia   . Hypertension   . PVC's (premature ventricular contractions)    a. Hx of trigeminy/bigeminy.  . Seizures (HCC)   . UTI (lower urinary tract infection)  Past Surgical History:  Procedure Laterality Date  . ABDOMINAL HYSTERECTOMY  1987   "partial"-still has ovaries  . CARDIAC CATHETERIZATION  05/17/2014  . CORONARY ANGIOPLASTY WITH STENT PLACEMENT  1995   "1"  . JOINT REPLACEMENT      right knee  . LEFT HEART CATHETERIZATION WITH CORONARY ANGIOGRAM N/A 05/17/2014   Procedure: LEFT HEART CATHETERIZATION WITH CORONARY ANGIOGRAM;  Surgeon: Marykay Lex, MD;  Location: [facility] CATH LAB;  Service: Cardiovascular;  Laterality: N/A;  . TOTAL KNEE ARTHROPLASTY Right 2005  . TUBAL LIGATION  1970  . VASCULAR SURGERY      Social History:  reports that she has never smoked. She has never used smokeless tobacco. She reports that she does not drink alcohol or use drugs.  Family History:  Family History  Problem Relation Age of Onset  . CAD Brother   . Diabetes Brother   . CAD Father   . Diabetes Father   . Diabetes Mother        father, sister, brothers  . Diabetes Sister      Prior to Admission medications   Medication Sig Start Date End Date Taking? Authorizing Provider  ACAI PO Take 1 capsule by mouth daily.   Yes [provider]  acetaminophen (TYLENOL) 325 MG tablet Take 650 mg by mouth every 6 (six) hours as needed for moderate pain.    Yes [provider]  Aloe Vera 25 MG CAPS Take 25 mg by mouth daily.    Yes [provider]  amLODipine (NORVASC) 10 MG tablet take 1 tablet by mouth once daily Patient taking differently: take 10 mg by mouth once daily 12/07/16  Yes Shelva Majestic, MD  aspirin 325 MG tablet Take 325 mg by mouth 3 (three) times a week.    Yes [provider]  aspirin EC 81 MG EC tablet Take 1 tablet (81 mg total) by mouth daily. 05/18/14  Yes Janetta Hora, PA-C  atorvastatin (LIPITOR) 20 MG tablet take 1 tablet by mouth once daily Patient taking differently: take 20 mg by mouth once daily 05/06/17  Yes Shelva Majestic, MD  Bee Pollen 550 MG CAPS Take 550 mg by mouth daily.    Yes [provider]  carvedilol (COREG) 6.25 MG tablet take 1 tablet by mouth twice a day with meals Patient taking differently: take 6.25 mg by mouth twice a day with meals 02/10/17  Yes Shelva Majestic, MD  cholecalciferol  (VITAMIN D) 400 UNITS TABS tablet Take 400 Units by mouth every other day.    Yes [provider]  Cyanocobalamin (VITAMIN B 12 PO) Take 1 capsule by mouth every other day.    Yes [provider]  esomeprazole (NEXIUM) 40 MG capsule Take 1 capsule (40 mg total) by mouth daily. Patient taking differently: Take 40 mg by mouth 2 (two) times daily.  11/19/16  Yes Shelva Majestic, MD  ezetimibe (ZETIA) 10 MG tablet take 1 tablet by mouth once daily Patient taking differently: take 10 mg by mouth once daily 01/11/17  Yes Shelva Majestic, MD  ferrous sulfate 325 (65 FE) MG tablet Take 1 tablet (325 mg total) by mouth daily. 05/21/17  Yes Jacalyn Lefevre, MD  hydrocortisone (ANUSOL-HC) 25 MG suppository Place 1 suppository (25 mg total) rectally 2 (two) times daily. 05/21/17  Yes Jacalyn Lefevre, MD  isosorbide mononitrate (IMDUR) 60 MG 24 hr tablet take 2 tablets by mouth once daily Patient taking differently: take 120 mg  by mouth once daily 01/11/17  Yes [name], Stephen O, MD  lisinopril (PRINIVIL,ZESTRIL) 20 MG tablet take 1 tablet by mouth once daily Patient taking differently: take 20 mg by mouth once daily 04/05/17  Yes [name], Stephen O, MD  nitroGLYCERIN (NITROSTAT) 0.4 MG SL tablet place 1 tablet under the tongue if needed every 5 minutes for chest pain for 3 doses Patient taking differently: place 0.4 mg under the tongue if needed every 5 minutes for chest pain for 3 doses 09/16/16  Yes [name], Stephen O, MD  oxybutynin (DITROPAN-XL) 10 MG 24 hr tablet Take 1 tablet (10 mg total) by mouth daily. 11/19/16  Yes [name], Stephen O, MD  potassium chloride (KLOR-CON 10) 10 MEQ tablet Take 1 tablet (10 mEq total) by mouth 2 (two) times daily. 08/11/16  Yes [name], Stephen O, MD  traMADol (ULTRAM) 50 MG tablet take 1 tablet by mouth up to once daily for migraines Patient taking differently: Take 50-150 mg by mouth every 6 (six) hours as needed for moderate pain.  11/19/16  Yes [name],  Stephen O, MD  venlafaxine XR (EFFEXOR-XR) 150 MG 24 hr capsule take 1 capsule by mouth at bedtime Patient taking differently: take 150 mg by mouth at bedtime 12/07/16  Yes [name], Stephen O, MD  vitamin C (ASCORBIC ACID) 500 MG tablet Take 500 mg by mouth every other day.    Yes [provider]  vitamin E (VITAMIN E) 1000 UNIT capsule Take 1,000 Units by mouth every other day.     [provider]    Physical Exam: Vitals:   05/22/17 2300 05/22/17 2330 05/23/17 0000 05/23/17 0030  BP: (!) 158/58 (!) 147/54 140/76 (!) 145/66  Pulse: (!) 102 97 91 90  Resp: (!) 23 20 (!) 25 (!) 24  Temp:    98.3 F (36.8 C)  TempSrc:    Oral  SpO2: 98% 99% 99% 100%  Weight:    56.5 kg (124 lb 9 oz)  Height:    5\' 6"  (1.676 m)   General: Not in acute distress HEENT:       Eyes: PERRL, EOMI, no scleral icterus.       ENT: No discharge from the ears and nose, no pharynx injection, no tonsillar enlargement. Pt has tenderness in thyroid gland.       Neck: No JVD, no bruit, no mass felt. Heme: No neck lymph node enlargement. Cardiac: S1/S2, RRR, No murmurs, No gallops or rubs. Respiratory: No rales, wheezing, rhonchi or rubs. GI: Soft, nondistended,  Has tenderness in suprapubic area, no rebound pain, no organomegaly, BS present. GU: No hematuria Ext: No pitting leg edema bilaterally. 2+DP/PT pulse bilaterally. Musculoskeletal: No joint deformities, No joint redness or warmth, no limitation of ROM in spin. Skin: No rashes.  Neuro: Alert, oriented X3, cranial nerves II-XII grossly intact, moves all extremities normally.   Psych: Patient is not psychotic, no suicidal or hemocidal ideation.  Labs on Admission: I have personally reviewed following labs and imaging studies  CBC:  Recent Labs Lab 05/18/17 0856 05/21/17 1122 05/22/17 1829  WBC 4.2 3.6* 4.2  NEUTROABS  --   --  2.2  HGB 10.0* 9.9* 11.5*  HCT 30.5* 30.3* 34.9*  MCV 93.2 89.6 89.3  PLT 234.0 276 297   Basic  Metabolic Panel:  Recent Labs Lab 05/21/17 1122 05/22/17 1829  NA 140 141  K 3.2* 3.4*  CL 106 104  CO2 26 27  GLUCOSE 117* 108*  BUN 13 10  CREATININE  0.83 0.67  CALCIUM 10.1 10.6*  MG  --  1.5*   GFR: Estimated Creatinine Clearance: 58.4 mL/min (by C-G formula based on SCr of 0.67 mg/dL). Liver Function Tests:  Recent Labs Lab 05/21/17 1122 05/22/17 1829  AST 40 36  ALT 28 31  ALKPHOS 50 59  BILITOT 1.0 1.4*  PROT 6.3* 7.3  ALBUMIN 3.1* 3.5    Recent Labs Lab 05/22/17 1829  LIPASE 33   No results for input(s): AMMONIA in the last 168 hours. Coagulation Profile:  Recent Labs Lab 05/22/17 1829  INR 1.30   Cardiac Enzymes:  Recent Labs Lab 05/23/17 0001  TROPONINI 0.27*   BNP (last 3 results)  Recent Labs  05/10/17 1324  PROBNP 289.0*   HbA1C: No results for input(s): HGBA1C in the last 72 hours. CBG: No results for input(s): GLUCAP in the last 168 hours. Lipid Profile: No results for input(s): CHOL, HDL, LDLCALC, TRIG, CHOLHDL, LDLDIRECT in the last 72 hours. Thyroid Function Tests:  Recent Labs  05/22/17 1829  TSH <0.010*   Anemia Panel: No results for input(s): VITAMINB12, FOLATE, FERRITIN, TIBC, IRON, RETICCTPCT in the last 72 hours. Urine analysis:    Component Value Date/Time   COLORURINE YELLOW 05/22/2017 1829   APPEARANCEUR CLEAR 05/22/2017 1829   LABSPEC 1.018 05/22/2017 1829   PHURINE 6.0 05/22/2017 1829   GLUCOSEU NEGATIVE 05/22/2017 1829   HGBUR NEGATIVE 05/22/2017 1829   BILIRUBINUR NEGATIVE 05/22/2017 1829   BILIRUBINUR n 05/10/2017 1507   KETONESUR 20 (A) 05/22/2017 1829   PROTEINUR 30 (A) 05/22/2017 1829   UROBILINOGEN 0.2 05/10/2017 1507   UROBILINOGEN 1.0 05/17/2014 0342   NITRITE NEGATIVE 05/22/2017 1829   LEUKOCYTESUR SMALL (A) 05/22/2017 1829   Sepsis Labs: @LABRCNTIP (procalcitonin:4,lacticidven:4) )No results found for this or any previous visit (from the past 240 hour(s)).   Radiological Exams on  Admission: Dg Chest 2 View  Result Date: 05/22/2017 CLINICAL DATA:  Shortness of breath and chest pain EXAM: CHEST  2 VIEW COMPARISON:  Chest radiograph 03/10/2016 FINDINGS: The heart size and mediastinal contours are within normal limits. Both lungs are clear. The visualized skeletal structures are unremarkable. IMPRESSION: No active cardiopulmonary disease. Electronically Signed   By: Deatra Robinson M.D.   On: 05/22/2017 20:01   Ct Head Wo Contrast  Result Date: 05/21/2017 CLINICAL DATA:  Unsteady gait.  Headache. EXAM: CT HEAD WITHOUT CONTRAST TECHNIQUE: Contiguous axial images were obtained from the base of the skull through the vertex without intravenous contrast. COMPARISON:  09/10/2011. FINDINGS: Brain: No evidence of acute infarction, hemorrhage, hydrocephalus, extra-axial collection or mass lesion/mass effect. Vascular: No hyperdense vessel or unexpected calcification. Skull: Stable right lateral skull osteoma. Sinuses/Orbits: Unremarkable. Other: None. IMPRESSION: No acute abnormality. Electronically Signed   By: Beckie Salts M.D.   On: 05/21/2017 12:38   Ct Angio Chest Pe W And/or Wo Contrast  Result Date: 05/22/2017 CLINICAL DATA:  Chest pain, shortness of breath. EXAM: CT ANGIOGRAPHY CHEST WITH CONTRAST TECHNIQUE: Multidetector CT imaging of the chest was performed using the standard protocol during bolus administration of intravenous contrast. Multiplanar CT image reconstructions and MIPs were obtained to evaluate the vascular anatomy. CONTRAST:  100 mL of Isovue 370 intravenously. COMPARISON:  CT scan of April 06, 2005.  Radiographs of same day. FINDINGS: Cardiovascular: Satisfactory opacification of the pulmonary arteries to the segmental level. No evidence of pulmonary embolism. Normal heart size. No pericardial effusion. Atherosclerosis of thoracic aorta is noted without aneurysm or dissection. Mediastinum/Nodes: No enlarged mediastinal, hilar, or axillary  lymph nodes. Thyroid gland,  trachea, and esophagus demonstrate no significant findings. Lungs/Pleura: Lungs are clear. No pleural effusion or pneumothorax. Upper Abdomen: No acute abnormality. Musculoskeletal: No chest wall abnormality. No acute or significant osseous findings. Review of the MIP images confirms the above findings. IMPRESSION: No definite evidence of pulmonary embolus. No acute cardiopulmonary abnormality is seen. Aortic Atherosclerosis (ICD10-I70.0). Electronically Signed   By: Lupita Raider, M.D.   On: 05/22/2017 21:22     EKG: Independently reviewed.  Sinus rhythm, LAE, PAC, PVC, T-wave inversion in lead V3-V4, ST depression in inferior leads   Assessment/Plan Principal Problem:   Subacute thyroiditis Active Problems:   HTN (hypertension)   GERD (gastroesophageal reflux disease)   UTI (urinary tract infection)   Hypercholesteremia   CAD (coronary artery disease)   Hypokalemia   Hyperthyroidism   Elevated lactic acid level   Generalized weakness   Dark stools   Elevated troponin   Hypomagnesemia   Hypercalcemia   Protein-calorie malnutrition, moderate (HCC)   NSTEMI (non-ST elevated myocardial infarction) (HCC)   Possible subacute thyroiditis: Pt has tenderness in thyroid gland. Her TSH was<0.01. Her free T3 was 12.8 and T4 3.64 on 05/18/17, consistent with hyperthyroidism, most likely due to subacute thyroiditis.  -will place on tele bed for obs -Switch coreg to Atenolol 50 mg bid -ASA -get US-thyroid    Possible NSTEMI and hx of CAD: s/p of stent: Patient has SOB and chest discomfort. CT Angiogram is negative for PE. Chest x-rays negative for pulmonary edema or infiltration. Patient has elevated BNP 900.5, but on physical examination patient does not have JVD or leg edema, does not seem to have acute CHF. Trop 0.12-->0.27, indicating possible NSTEMI. - cycle CE q6 x3 and repeat EKG in the am  - prn Ntroglycerin, Morphine, and aspirin, lipitor and zetia, Atenolol - Risk factor  stratification: will check FLP, UDS and A1C  - 2d echo - LE doppler to r/o DVT due to positive D-dimer - Inpatient non-urgent consult order was put in Epic and e-mail to Daun Peacock was sent out.  Possible UTI: Patient has intermittent dysuria and burning on urination. She has a suprapubic abdominal tenderness, but no acute abdomen on physical examination. Lipase 33. Urinalysis showed small amount of leukocytes with many Bacteroides, indicating possible UTI. -IVF Rocephin -Follow up blood culture and urine culture  GERD: -Protonix 40 mg bid  HTN (hypertension): Blood pressure 143/62 -IV hydralazine when necessary -Continue amlodipine,lisinopril -Switch to Coreg to atenolol  HLD: -lipitor and zetia  Hypokalemia and hypomagnesemia: K= 1.5 on admission. - Repleted both  Hypercalcemia: Calcium 10.6, mild. Likely due to dehydration -Hold D -check PTH -IV fluids: 1 L normal saline, followed by 75 mL per hour  Elevated lactic acid level: likely due to dehydration. Patient does not have fever or leukocytosis, does not seem to be due to sepsis. Lactic acid has normalized with IV fluid, 2.8-->1.2. --IV fluids: 1 L normal saline, followed by 75 mL per hour  Dark stools: FOBT is negative today. -increased dose of Protonix from 40 mg daily to twice a day given hx of GIB and ASA use  Protein-calorie malnutrition, moderate (HCC): -Nutrition consult  Generalized weakness: Likely due to multifactorial etiology, as listed above -PT/OT  DVT ppx: SQ Lovenox Code Status: Full code Family Communication: Yes, patient's daughter and the son-in-law   at bed side Disposition Plan:  Anticipate discharge back to previous home environment Consults called:  none Admission status: Obs / tele  Date of Service 05/23/2017    Lorretta HarpIU, Ahmod Gillespie [facility] Pager [phone]  If 7PM-7AM, please contact night-coverage [email or web address] Password [email or web address] 05/23/2017, 1:59 AM

## 2017-05-22 NOTE — ED Provider Notes (Signed)
[facility]-EMERGENCY DEPT Provider Note   CSN: [id number] Arrival date & time: 05/22/17  1450     History   Chief Complaint Chief Complaint  Patient presents with  . Weakness    HPI [name] is a 71 y.o. female.  The history is provided by the patient, a relative and medical records.  Shortness of Breath  This is a new problem. The average episode lasts 5 days. The problem occurs continuously.The problem has been gradually worsening. Associated symptoms include neck pain, cough, vomiting and abdominal pain. Pertinent negatives include no fever, no headaches, no rhinorrhea, no sputum production, no hemoptysis, no wheezing, no chest pain, no syncope, no leg swelling and no claudication. It is unknown what precipitated the problem. She has tried nothing for the symptoms. The treatment provided no relief. She has had prior ED visits. Associated medical issues include CAD. Associated medical issues do not include COPD, chronic lung disease, PE, heart failure or DVT.    Past Medical History:  Diagnosis Date  . Arthritis   . Chronic lower back pain   . Cluster headache   . Coronary artery disease    a. 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 non obs dz LHC 2007. c. Non obs dz (mild-mod) by [facility] 05/17/14.   . Diverticulitis    a. Hx microperf 2012 - hospitalizated.  Marland Kitchen GERD (gastroesophageal reflux disease)   . Hypercholesteremia   . Hypertension   . PVC's (premature ventricular contractions)    a. Hx of trigeminy/bigeminy.  . Seizures (HCC)   . UTI (lower urinary tract infection)     Patient Active Problem List   Diagnosis Date Noted  . Overactive bladder 09/19/2014  . Migraine 09/19/2014  . Hyperglycemia 09/19/2014  . Hypokalemia 06/01/2014  . CAD (coronary artery disease)   . Seizures (HCC)   . Arthritis   . Hypercholesteremia   . GERD (gastroesophageal reflux disease) 10/04/2011  . HTN (hypertension) 10/01/2011    Past Surgical History:  Procedure Laterality Date  . ABDOMINAL  HYSTERECTOMY  1987   "partial"-still has ovaries  . CARDIAC CATHETERIZATION  05/17/2014  . CORONARY ANGIOPLASTY WITH STENT PLACEMENT  1995   "1"  . JOINT REPLACEMENT     right knee  . LEFT HEART CATHETERIZATION WITH CORONARY ANGIOGRAM N/A 05/17/2014   Procedure: LEFT HEART CATHETERIZATION WITH CORONARY ANGIOGRAM;  Surgeon: Marykay Lex, MD;  Location: [facility] CATH LAB;  Service: Cardiovascular;  Laterality: N/A;  . TOTAL KNEE ARTHROPLASTY Right 2005  . TUBAL LIGATION  1970  . VASCULAR SURGERY      OB History    No data available       Home Medications    Prior to Admission medications   Medication Sig Start Date End Date Taking? Authorizing Provider  ACAI PO Take 1 capsule by mouth daily.    [provider]  acetaminophen (TYLENOL) 325 MG tablet Take 650 mg by mouth every 6 (six) hours as needed for moderate pain.     [provider]  Aloe Vera 25 MG CAPS Take 25 mg by mouth daily.     [provider]  amLODipine (NORVASC) 10 MG tablet take 1 tablet by mouth once daily Patient taking differently: take 10 mg by mouth once daily 12/07/16   Shelva Majestic, MD  aspirin 325 MG tablet Take 325 mg by mouth 3 (three) times a week.     [provider]  aspirin EC 81 MG EC tablet Take 1 tablet (81 mg total) by  mouth daily. Patient not taking: Reported on 05/21/2017 05/18/14   Janetta Horahompson, Kathryn R, PA-C  atorvastatin (LIPITOR) 20 MG tablet take 1 tablet by mouth once daily Patient taking differently: take 20 mg by mouth once daily 05/06/17   [name], Stephen O, MD  Bee Pollen 550 MG CAPS Take 550 mg by mouth daily.     [provider]  carvedilol (COREG) 6.25 MG tablet take 1 tablet by mouth twice a day with meals Patient taking differently: take 6.25 mg by mouth twice a day with meals 02/10/17   [name], Stephen O, MD  cholecalciferol (VITAMIN D) 400 UNITS TABS tablet Take 400 Units by mouth every other day.     [provider]  Cyanocobalamin  (VITAMIN B 12 PO) Take 1 capsule by mouth every other day.     [provider]  esomeprazole (NEXIUM) 40 MG capsule Take 1 capsule (40 mg total) by mouth daily. Patient taking differently: Take 40 mg by mouth 2 (two) times daily.  11/19/16   [name], Stephen O, MD  ezetimibe (ZETIA) 10 MG tablet take 1 tablet by mouth once daily Patient taking differently: take 10 mg by mouth once daily 01/11/17   [name], Stephen O, MD  ferrous sulfate 325 (65 FE) MG tablet Take 1 tablet (325 mg total) by mouth daily. 05/21/17   Jacalyn LefevreHaviland, Julie, MD  hydrocortisone (ANUSOL-HC) 25 MG suppository Place 1 suppository (25 mg total) rectally 2 (two) times daily. 05/21/17   Jacalyn LefevreHaviland, Julie, MD  isosorbide mononitrate (IMDUR) 60 MG 24 hr tablet take 2 tablets by mouth once daily Patient taking differently: take 120 mg by mouth once daily 01/11/17   [name], Stephen O, MD  lisinopril (PRINIVIL,ZESTRIL) 20 MG tablet take 1 tablet by mouth once daily Patient taking differently: take 20 mg by mouth once daily 04/05/17   [name], Stephen O, MD  nitroGLYCERIN (NITROSTAT) 0.4 MG SL tablet place 1 tablet under the tongue if needed every 5 minutes for chest pain for 3 doses Patient taking differently: place 0.4 mg under the tongue if needed every 5 minutes for chest pain for 3 doses 09/16/16   [name], Stephen O, MD  oxybutynin (DITROPAN-XL) 10 MG 24 hr tablet Take 1 tablet (10 mg total) by mouth daily. 11/19/16   [name], Stephen O, MD  potassium chloride (KLOR-CON 10) 10 MEQ tablet Take 1 tablet (10 mEq total) by mouth 2 (two) times daily. 08/11/16   [name], Stephen O, MD  traMADol (ULTRAM) 50 MG tablet take 1 tablet by mouth up to once daily for migraines Patient taking differently: Take 50-150 mg by mouth every 6 (six) hours as needed for moderate pain.  11/19/16   [name], Stephen O, MD  venlafaxine XR (EFFEXOR-XR) 150 MG 24 hr capsule take 1 capsule by mouth at bedtime Patient taking differently: take 150 mg by mouth at bedtime  12/07/16   [name], Stephen O, MD  vitamin C (ASCORBIC ACID) 500 MG tablet Take 500 mg by mouth every other day.     [provider]  vitamin E (VITAMIN E) 1000 UNIT capsule Take 1,000 Units by mouth every other day.     [provider]    Family History Family History  Problem Relation Age of Onset  . CAD Brother   . Diabetes Brother   . CAD Father   . Diabetes Father   . Diabetes Mother        father, sister, brothers  . Diabetes Sister     Social History  Social History  Substance Use Topics  . Smoking status: Never Smoker  . Smokeless tobacco: Never Used  . Alcohol use No     Allergies   Sulfa drugs cross reactors   Review of Systems Review of Systems  Constitutional: Positive for fatigue. Negative for chills, diaphoresis and fever.  HENT: Negative for congestion and rhinorrhea.   Eyes: Negative for visual disturbance.  Respiratory: Positive for cough, chest tightness and shortness of breath. Negative for hemoptysis, sputum production and wheezing.   Cardiovascular: Positive for palpitations. Negative for chest pain, claudication, leg swelling and syncope.  Gastrointestinal: Positive for abdominal pain, nausea and vomiting. Negative for constipation and diarrhea.  Genitourinary: Negative for dysuria.  Musculoskeletal: Positive for neck pain. Negative for back pain and neck stiffness.  Skin: Negative for wound.  Neurological: Positive for light-headedness. Negative for numbness and headaches.  Psychiatric/Behavioral: Negative for confusion.  All other systems reviewed and are negative.    Physical Exam Updated Vital Signs BP (!) 155/84   Pulse (!) 101   Temp 98.4 F (36.9 C) (Oral)   Resp 16   Ht 5\' 6"  (1.676 m)   Wt 61.2 kg (135 lb)   SpO2 98%   BMI 21.79 kg/m   Physical Exam  Constitutional: She is oriented to person, place, and time. She appears well-developed and well-nourished. No distress.  HENT:  Head: Normocephalic and  atraumatic.  Mouth/Throat: Oropharynx is clear and moist.  Eyes: Pupils are equal, round, and reactive to light. Conjunctivae and EOM are normal.  Neck: Normal range of motion. Neck supple.  Cardiovascular: Normal rate and intact distal pulses.   No murmur heard. Pulmonary/Chest: Effort normal and breath sounds normal. No stridor. No respiratory distress. She has no wheezes. She exhibits no tenderness.  Abdominal: Soft. There is no tenderness.  Genitourinary: Rectal exam shows guaiac negative stool.  Musculoskeletal: She exhibits no edema or tenderness.  Neurological: She is alert and oriented to person, place, and time. No sensory deficit. She exhibits normal muscle tone.  Skin: Skin is warm and dry. Capillary refill takes less than 2 seconds. No rash noted. She is not diaphoretic. No erythema.  Psychiatric: She has a normal mood and affect.  Nursing note and vitals reviewed.    ED Treatments / Results  Labs (all labs ordered are listed, but only abnormal results are displayed) Labs Reviewed  CBC WITH DIFFERENTIAL/PLATELET - Abnormal; Notable for the following:       Result Value   Hemoglobin 11.5 (*)    HCT 34.9 (*)    All other components within normal limits  COMPREHENSIVE METABOLIC PANEL - Abnormal; Notable for the following:    Potassium 3.4 (*)    Glucose, Bld 108 (*)    Calcium 10.6 (*)    Total Bilirubin 1.4 (*)    All other components within normal limits  D-DIMER, QUANTITATIVE (NOT AT [facility]) - Abnormal; Notable for the following:    D-Dimer, Quant 1.42 (*)    All other components within normal limits  PROTIME-INR - Abnormal; Notable for the following:    Prothrombin Time 16.2 (*)    All other components within normal limits  MAGNESIUM - Abnormal; Notable for the following:    Magnesium 1.5 (*)    All other components within normal limits  URINALYSIS, ROUTINE W REFLEX MICROSCOPIC - Abnormal; Notable for the following:    Ketones, ur 20 (*)    Protein, ur 30 (*)     Leukocytes, UA SMALL (*)  Bacteria, UA MANY (*)    Squamous Epithelial / LPF 0-5 (*)    All other components within normal limits  TSH - Abnormal; Notable for the following:    TSH <0.010 (*)    All other components within normal limits  BRAIN NATRIURETIC PEPTIDE - Abnormal; Notable for the following:    B Natriuretic Peptide 900.5 (*)    All other components within normal limits  TROPONIN I - Abnormal; Notable for the following:    Troponin I 0.27 (*)    All other components within normal limits  I-STAT CG4 LACTIC ACID, ED - Abnormal; Notable for the following:    Lactic Acid, Venous 2.18 (*)    All other components within normal limits  I-STAT TROPONIN, ED - Abnormal; Notable for the following:    Troponin i, poc 0.15 (*)    All other components within normal limits  I-STAT TROPONIN, ED - Abnormal; Notable for the following:    Troponin i, poc 0.12 (*)    All other components within normal limits  URINE CULTURE  CULTURE, BLOOD (ROUTINE X 2)  CULTURE, BLOOD (ROUTINE X 2)  RAPID STREP SCREEN (NOT AT [facility])  LIPASE, BLOOD  BASIC METABOLIC PANEL  CBC  PARATHYROID HORMONE, INTACT (NO CA)  HEMOGLOBIN A1C  LIPID PANEL  TROPONIN I  TROPONIN I  RAPID URINE DRUG SCREEN, HOSP PERFORMED  HIV ANTIBODY (ROUTINE TESTING)  HEPARIN LEVEL (UNFRACTIONATED)  POC OCCULT BLOOD, ED  I-STAT CG4 LACTIC ACID, ED    EKG  EKG Interpretation  Date/Time:  Saturday May 22 2017 15:08:46 EDT Ventricular Rate:  101 PR Interval:  126 QRS Duration: 96 QT Interval:  348 QTC Calculation: 451 R Axis:   72 Text Interpretation:  Sinus tachycardia with occasional Premature ventricular complexes Possible Left atrial enlargement Marked ST abnormality, possible inferior subendocardial injury Abnormal ECG When compared ti prior, similar ST abnormalities.  No STEMI Confirmed by Theda Belfast ([id number]) on 05/22/2017 5:41:25 PM       Radiology Dg Chest 2 View  Result Date: 05/22/2017 CLINICAL DATA:   Shortness of breath and chest pain EXAM: CHEST  2 VIEW COMPARISON:  Chest radiograph 03/10/2016 FINDINGS: The heart size and mediastinal contours are within normal limits. Both lungs are clear. The visualized skeletal structures are unremarkable. IMPRESSION: No active cardiopulmonary disease. Electronically Signed   By: Deatra Robinson M.D.   On: 05/22/2017 20:01   Ct Head Wo Contrast  Result Date: 05/21/2017 CLINICAL DATA:  Unsteady gait.  Headache. EXAM: CT HEAD WITHOUT CONTRAST TECHNIQUE: Contiguous axial images were obtained from the base of the skull through the vertex without intravenous contrast. COMPARISON:  09/10/2011. FINDINGS: Brain: No evidence of acute infarction, hemorrhage, hydrocephalus, extra-axial collection or mass lesion/mass effect. Vascular: No hyperdense vessel or unexpected calcification. Skull: Stable right lateral skull osteoma. Sinuses/Orbits: Unremarkable. Other: None. IMPRESSION: No acute abnormality. Electronically Signed   By: Beckie Salts M.D.   On: 05/21/2017 12:38   Ct Angio Chest Pe W And/or Wo Contrast  Result Date: 05/22/2017 CLINICAL DATA:  Chest pain, shortness of breath. EXAM: CT ANGIOGRAPHY CHEST WITH CONTRAST TECHNIQUE: Multidetector CT imaging of the chest was performed using the standard protocol during bolus administration of intravenous contrast. Multiplanar CT image reconstructions and MIPs were obtained to evaluate the vascular anatomy. CONTRAST:  100 mL of Isovue 370 intravenously. COMPARISON:  CT scan of April 06, 2005.  Radiographs of same day. FINDINGS: Cardiovascular: Satisfactory opacification of the pulmonary arteries to the segmental level. No evidence  of pulmonary embolism. Normal heart size. No pericardial effusion. Atherosclerosis of thoracic aorta is noted without aneurysm or dissection. Mediastinum/Nodes: No enlarged mediastinal, hilar, or axillary lymph nodes. Thyroid gland, trachea, and esophagus demonstrate no significant findings. Lungs/Pleura:  Lungs are clear. No pleural effusion or pneumothorax. Upper Abdomen: No acute abnormality. Musculoskeletal: No chest wall abnormality. No acute or significant osseous findings. Review of the MIP images confirms the above findings. IMPRESSION: No definite evidence of pulmonary embolus. No acute cardiopulmonary abnormality is seen. Aortic Atherosclerosis (ICD10-I70.0). Electronically Signed   By: Lupita Raider, M.D.   On: 05/22/2017 21:22    Procedures Procedures (including critical care time)  CRITICAL CARE Performed by: Canary Brim Pearley Baranek Total critical care time: 35 minutes Critical care time was exclusive of separately billable procedures and treating other patients. Shortness of breath with rising troponin.  Critical care was necessary to treat or prevent imminent or life-threatening deterioration. Critical care was time spent personally by me on the following activities: development of treatment plan with patient and/or surrogate as well as nursing, discussions with consultants, evaluation of patient's response to treatment, examination of patient, obtaining history from patient or surrogate, ordering and performing treatments and interventions, ordering and review of laboratory studies, ordering and review of radiographic studies, pulse oximetry and re-evaluation of patient's condition.   Medications Ordered in ED Medications  ferrous sulfate tablet 325 mg (not administered)  hydrocortisone (ANUSOL-HC) suppository 25 mg (not administered)  atorvastatin (LIPITOR) tablet 20 mg (not administered)  acetaminophen (TYLENOL) tablet 650 mg (not administered)  lisinopril (PRINIVIL,ZESTRIL) tablet 20 mg (not administered)  ezetimibe (ZETIA) tablet 10 mg (not administered)  isosorbide mononitrate (IMDUR) 24 hr tablet 120 mg (not administered)  amLODipine (NORVASC) tablet 10 mg (not administered)  venlafaxine XR (EFFEXOR-XR) 24 hr capsule 150 mg (150 mg Oral Given 05/23/17 0158)  pantoprazole  (PROTONIX) EC tablet 40 mg (not administered)  oxybutynin (DITROPAN-XL) 24 hr tablet 10 mg (not administered)  traMADol (ULTRAM) tablet 50-100 mg (50 mg Oral Given 05/23/17 0206)  vitamin C (ASCORBIC ACID) tablet 500 mg (not administered)  vitamin B-12 (CYANOCOBALAMIN) tablet 100 mcg (not administered)  vitamin E capsule 1,000 Units (not administered)  aspirin tablet 325 mg (325 mg Oral Given 05/22/17 2351)  atenolol (TENORMIN) tablet 50 mg (50 mg Oral Given 05/22/17 2351)  hydrALAZINE (APRESOLINE) injection 5 mg (not administered)  ondansetron (ZOFRAN) injection 4 mg (not administered)  cefTRIAXone (ROCEPHIN) 1 g in dextrose 5 % 50 mL IVPB (not administered)  zolpidem (AMBIEN) tablet 5 mg (5 mg Oral Given 05/23/17 0206)  morphine 4 MG/ML injection 2 mg (not administered)  aspirin chewable tablet 81 mg (81 mg Oral Not Given 05/22/17 2352)  0.9 %  sodium chloride infusion ( Intravenous New Bag/Given 05/23/17 0158)  heparin ADULT infusion 100 units/mL (25000 units/248mL sodium chloride 0.45%) (650 Units/hr Intravenous New Bag/Given 05/23/17 0206)  sodium chloride 0.9 % bolus 1,000 mL (0 mLs Intravenous Stopped 05/22/17 2035)  iopamidol (ISOVUE-370) 76 % injection (100 mLs Intravenous Contrast Given 05/22/17 2056)  potassium chloride 20 MEQ/15ML (10%) solution 20 mEq (20 mEq Oral Given 05/22/17 2351)  magnesium sulfate IVPB 2 g 50 mL (0 g Intravenous Stopped 05/23/17 0058)  cefTRIAXone (ROCEPHIN) 1 g in dextrose 5 % 50 mL IVPB (0 g Intravenous Stopped 05/23/17 0020)  heparin bolus via infusion 2,000 Units (2,000 Units Intravenous Bolus from Bag 05/23/17 0206)     Initial Impression / Assessment and Plan / ED Course  I have reviewed the triage vital  signs and the nursing notes.  Pertinent labs & imaging results that were available during my care of the patient were reviewed by me and considered in my medical decision making (see chart for details).     [name] is a 71 y.o. female with a past  medical history significant for hypertension, hypercholesterolemia, CAD, migraines, and prior diverticulitis who presents with multiple complaints including severe fatigue causing decrease in ambulation, shortness of breath, tachycardia with palpitations, abdominal pain, dysuria, dark tarry stools, feeling dehydrated, neck pain, and nausea with vomiting. Patient reports that she has been gradually worsening for the last month but in the last several days her symptoms acutely worsened. She reports that she was seen yesterday and was told that she has thyroid problems and possibly a GI bleed. She was told to follow-up with 2 different specialist, GI and endocrine doctor. She says that today, her fatigue progressed to where she could not even walk and she continued to have her other symptoms including worsening shortness of breath. With the combination of symptoms, patient was brought in by her son.  Patient reports that he is continued to have dark tarry stools. She denies any rectal pain. She reports left upper abdominal pain that radiates into the epigastrium. She reports nausea and vomiting with liquid emesis. She denies severe chest pain at this time. She reports feeling lightheaded that she was going to pass out. She was tachycardic in the 130s on arrival. Patient says that she is having anterior neck pain but is able to move her neck in all directions. No change in voice or hoarseness. No difficulty swallowing or breathing. No stridor.  Based on patient's symptoms, and is relatively etiologies of the constellation of symptoms. Patient will have a fecal occult test performed. Will have workup to look for etiology of shortness of breath as well as her fatigue with laboratory testing. Anticipate reassessment following workup and rehydration.     7:38 PM Initial diagnostic testing and return. Troponin positive at 0.15. D-dimer also elevated.   Patient will have CT PE study to further evaluate.  CTPE study  was ordered with no evidence of pulmonary embolism present.   BNP was elevated as was troponin. Given the concern for shortness of breath and possible heart failure with elevated troponin, patient will be admitted to the hospitalist service for further management.  Patient and family agreed with admission given significant symptoms and workup abnormality. Patient admitted in stable condition to hospitalist service.    Final Clinical Impressions(s) / ED Diagnoses   Final diagnoses:  Subacute thyroiditis  Shortness of breath  Elevated troponin    Clinical Impression: 1. Shortness of breath   2. Subacute thyroiditis   3. Elevated troponin     Disposition: Admit to Hosputalist serviec    Laia Wiley, Canary Brim, MD 05/23/17 1241

## 2017-05-22 NOTE — ED Notes (Signed)
Pt's son Neita Goodnightlijah left and told us to call him if needed at [phone]

## 2017-05-22 NOTE — ED Triage Notes (Signed)
Per Pt, Pt reports having"weird symptoms" for three months. Reports seeing a PCP three weeks ago. Pt came yesterday with complaints of generalized fatigue, multiple body aches, throat pain. Pt reports that she needs to stay because She feels weak all over.

## 2017-05-23 ENCOUNTER — Other Ambulatory Visit: Payer: Self-pay

## 2017-05-23 ENCOUNTER — Observation Stay (HOSPITAL_COMMUNITY): Payer: Medicare HMO

## 2017-05-23 ENCOUNTER — Inpatient Hospital Stay (HOSPITAL_COMMUNITY): Payer: Medicare HMO

## 2017-05-23 DIAGNOSIS — M545 Low back pain: Secondary | ICD-10-CM | POA: Diagnosis not present

## 2017-05-23 DIAGNOSIS — I248 Other forms of acute ischemic heart disease: Secondary | ICD-10-CM | POA: Diagnosis not present

## 2017-05-23 DIAGNOSIS — R748 Abnormal levels of other serum enzymes: Secondary | ICD-10-CM | POA: Diagnosis not present

## 2017-05-23 DIAGNOSIS — M1990 Unspecified osteoarthritis, unspecified site: Secondary | ICD-10-CM | POA: Diagnosis not present

## 2017-05-23 DIAGNOSIS — D638 Anemia in other chronic diseases classified elsewhere: Secondary | ICD-10-CM | POA: Diagnosis not present

## 2017-05-23 DIAGNOSIS — E0590 Thyrotoxicosis, unspecified without thyrotoxic crisis or storm: Secondary | ICD-10-CM | POA: Diagnosis not present

## 2017-05-23 DIAGNOSIS — I214 Non-ST elevation (NSTEMI) myocardial infarction: Secondary | ICD-10-CM | POA: Diagnosis not present

## 2017-05-23 DIAGNOSIS — R7989 Other specified abnormal findings of blood chemistry: Secondary | ICD-10-CM | POA: Diagnosis not present

## 2017-05-23 DIAGNOSIS — Z8719 Personal history of other diseases of the digestive system: Secondary | ICD-10-CM | POA: Diagnosis not present

## 2017-05-23 DIAGNOSIS — K625 Hemorrhage of anus and rectum: Secondary | ICD-10-CM | POA: Diagnosis not present

## 2017-05-23 DIAGNOSIS — E440 Moderate protein-calorie malnutrition: Secondary | ICD-10-CM | POA: Diagnosis not present

## 2017-05-23 DIAGNOSIS — Z833 Family history of diabetes mellitus: Secondary | ICD-10-CM | POA: Diagnosis not present

## 2017-05-23 DIAGNOSIS — N390 Urinary tract infection, site not specified: Secondary | ICD-10-CM | POA: Diagnosis not present

## 2017-05-23 DIAGNOSIS — I25119 Atherosclerotic heart disease of native coronary artery with unspecified angina pectoris: Secondary | ICD-10-CM | POA: Diagnosis not present

## 2017-05-23 DIAGNOSIS — I21A1 Myocardial infarction type 2: Secondary | ICD-10-CM | POA: Diagnosis not present

## 2017-05-23 DIAGNOSIS — E061 Subacute thyroiditis: Secondary | ICD-10-CM | POA: Diagnosis not present

## 2017-05-23 DIAGNOSIS — R0602 Shortness of breath: Secondary | ICD-10-CM | POA: Diagnosis not present

## 2017-05-23 DIAGNOSIS — I10 Essential (primary) hypertension: Secondary | ICD-10-CM | POA: Diagnosis not present

## 2017-05-23 DIAGNOSIS — R079 Chest pain, unspecified: Secondary | ICD-10-CM | POA: Diagnosis not present

## 2017-05-23 DIAGNOSIS — R51 Headache: Secondary | ICD-10-CM | POA: Diagnosis not present

## 2017-05-23 DIAGNOSIS — Z96651 Presence of right artificial knee joint: Secondary | ICD-10-CM | POA: Diagnosis present

## 2017-05-23 DIAGNOSIS — E7800 Pure hypercholesterolemia, unspecified: Secondary | ICD-10-CM | POA: Diagnosis not present

## 2017-05-23 DIAGNOSIS — Z6820 Body mass index (BMI) 20.0-20.9, adult: Secondary | ICD-10-CM | POA: Diagnosis not present

## 2017-05-23 DIAGNOSIS — I340 Nonrheumatic mitral (valve) insufficiency: Secondary | ICD-10-CM | POA: Diagnosis not present

## 2017-05-23 DIAGNOSIS — R9439 Abnormal result of other cardiovascular function study: Secondary | ICD-10-CM | POA: Diagnosis not present

## 2017-05-23 DIAGNOSIS — E785 Hyperlipidemia, unspecified: Secondary | ICD-10-CM | POA: Diagnosis present

## 2017-05-23 DIAGNOSIS — E876 Hypokalemia: Secondary | ICD-10-CM | POA: Diagnosis not present

## 2017-05-23 DIAGNOSIS — Z882 Allergy status to sulfonamides status: Secondary | ICD-10-CM | POA: Diagnosis not present

## 2017-05-23 DIAGNOSIS — R195 Other fecal abnormalities: Secondary | ICD-10-CM | POA: Diagnosis not present

## 2017-05-23 DIAGNOSIS — I252 Old myocardial infarction: Secondary | ICD-10-CM | POA: Diagnosis not present

## 2017-05-23 DIAGNOSIS — G8929 Other chronic pain: Secondary | ICD-10-CM | POA: Diagnosis not present

## 2017-05-23 DIAGNOSIS — Z7982 Long term (current) use of aspirin: Secondary | ICD-10-CM | POA: Diagnosis not present

## 2017-05-23 DIAGNOSIS — R0789 Other chest pain: Secondary | ICD-10-CM | POA: Diagnosis not present

## 2017-05-23 DIAGNOSIS — Z955 Presence of coronary angioplasty implant and graft: Secondary | ICD-10-CM | POA: Diagnosis not present

## 2017-05-23 DIAGNOSIS — I2510 Atherosclerotic heart disease of native coronary artery without angina pectoris: Secondary | ICD-10-CM | POA: Diagnosis not present

## 2017-05-23 DIAGNOSIS — Z8249 Family history of ischemic heart disease and other diseases of the circulatory system: Secondary | ICD-10-CM | POA: Diagnosis not present

## 2017-05-23 DIAGNOSIS — E039 Hypothyroidism, unspecified: Secondary | ICD-10-CM | POA: Diagnosis not present

## 2017-05-23 DIAGNOSIS — R531 Weakness: Secondary | ICD-10-CM | POA: Diagnosis not present

## 2017-05-23 DIAGNOSIS — K219 Gastro-esophageal reflux disease without esophagitis: Secondary | ICD-10-CM | POA: Diagnosis not present

## 2017-05-23 DIAGNOSIS — Z90710 Acquired absence of both cervix and uterus: Secondary | ICD-10-CM | POA: Diagnosis not present

## 2017-05-23 LAB — ECHOCARDIOGRAM COMPLETE
AOASC: 30 cm
CHL CUP MV DEC (S): 190
CHL CUP REG VEL DIAS: 115 cm/s
CHL CUP RV SYS PRESS: 53 mmHg
E/e' ratio: 10.29
EWDT: 190 ms
FS: 36 % (ref 28–44)
HEIGHTINCHES: 66 in
IV/PV OW: 0.95
LA diam end sys: 37 mm
LA diam index: 2.29 cm/m2
LA vol A4C: 94.7 ml
LA vol index: 55.8 mL/m2
LASIZE: 37 mm
LAVOL: 90.2 mL
LV E/e' medial: 10.29
LV e' LATERAL: 10.2 cm/s
LVEEAVG: 10.29
LVOT area: 2.84 cm2
LVOT diameter: 19 mm
Lateral S' vel: 19 cm/s
MRPISAEROA: 0.07 cm2
MV VTI: 176 cm
MV pk E vel: 105 m/s
MVPG: 4 mmHg
MVPKAVEL: 99 m/s
PV Reg grad dias: 5 mmHg
PW: 11.1 mm — AB (ref 0.6–1.1)
Reg peak vel: 310 cm/s
TAPSE: 21.7 mm
TDI e' lateral: 10.2
TDI e' medial: 5.66
TRMAXVEL: 310 cm/s
Weight: 1992.96 oz

## 2017-05-23 LAB — BASIC METABOLIC PANEL
ANION GAP: 9 (ref 5–15)
BUN: 8 mg/dL (ref 6–20)
CALCIUM: 9.7 mg/dL (ref 8.9–10.3)
CO2: 27 mmol/L (ref 22–32)
CREATININE: 0.57 mg/dL (ref 0.44–1.00)
Chloride: 104 mmol/L (ref 101–111)
GFR calc Af Amer: >60 mL/min (ref >60)
GFR calc non Af Amer: >60 mL/min (ref >60)
GLUCOSE: 99 mg/dL (ref 65–99)
Potassium: 3 mmol/L — ABNORMAL LOW (ref 3.5–5.1)
Sodium: 140 mmol/L (ref 135–145)

## 2017-05-23 LAB — LIPID PANEL
CHOLESTEROL: 75 mg/dL (ref 0–200)
HDL: 25 mg/dL — ABNORMAL LOW (ref >40)
LDL Cholesterol: 38 mg/dL (ref 0–99)
Total CHOL/HDL Ratio: 3 RATIO
Triglycerides: 58 mg/dL (ref <150)
VLDL: 12 mg/dL (ref 0–40)

## 2017-05-23 LAB — RAPID URINE DRUG SCREEN, HOSP PERFORMED
Amphetamines: NOT DETECTED
BARBITURATES: NOT DETECTED
BENZODIAZEPINES: NOT DETECTED
Cocaine: NOT DETECTED
Opiates: NOT DETECTED
Tetrahydrocannabinol: NOT DETECTED

## 2017-05-23 LAB — CBC
HCT: 32.1 % — ABNORMAL LOW (ref 36.0–46.0)
HEMOGLOBIN: 10.4 g/dL — AB (ref 12.0–15.0)
MCH: 28.8 pg (ref 26.0–34.0)
MCHC: 32.4 g/dL (ref 30.0–36.0)
MCV: 88.9 fL (ref 78.0–100.0)
Platelets: 290 10*3/uL (ref 150–400)
RBC: 3.61 MIL/uL — ABNORMAL LOW (ref 3.87–5.11)
RDW: 13.9 % (ref 11.5–15.5)
WBC: 4.6 10*3/uL (ref 4.0–10.5)

## 2017-05-23 LAB — HIV ANTIBODY (ROUTINE TESTING W REFLEX): HIV SCREEN 4TH GENERATION: NONREACTIVE

## 2017-05-23 LAB — HEPARIN LEVEL (UNFRACTIONATED)

## 2017-05-23 LAB — TROPONIN I
TROPONIN I: 0.25 ng/mL — AB (ref <0.03)
Troponin I: 0.2 ng/mL (ref <0.03)
Troponin I: 0.27 ng/mL (ref <0.03)

## 2017-05-23 LAB — RAPID STREP SCREEN (MED CTR MEBANE ONLY): STREPTOCOCCUS, GROUP A SCREEN (DIRECT): NEGATIVE

## 2017-05-23 LAB — MAGNESIUM: MAGNESIUM: 1.8 mg/dL (ref 1.7–2.4)

## 2017-05-23 MED ORDER — HEPARIN (PORCINE) IN NACL 100-0.45 UNIT/ML-% IJ SOLN
650.0000 [IU]/h | INTRAMUSCULAR | Status: DC
  Administered 2017-05-23: 650 [IU]/h via INTRAVENOUS
  Filled 2017-05-23: qty 250

## 2017-05-23 MED ORDER — HEPARIN BOLUS VIA INFUSION
2000.0000 [IU] | Freq: Once | INTRAVENOUS | Status: AC
Start: 2017-05-23 — End: 2017-05-23
  Administered 2017-05-23: 2000 [IU] via INTRAVENOUS
  Filled 2017-05-23: qty 2000

## 2017-05-23 MED ORDER — ACETAMINOPHEN 325 MG PO TABS
650.0000 mg | ORAL_TABLET | Freq: Four times a day (QID) | ORAL | Status: DC | PRN
  Administered 2017-05-24 – 2017-05-25 (×4): 650 mg via ORAL
  Filled 2017-05-23 (×4): qty 2

## 2017-05-23 MED ORDER — POTASSIUM CHLORIDE 10 MEQ/100ML IV SOLN
10.0000 meq | INTRAVENOUS | Status: AC
  Administered 2017-05-23 (×4): 10 meq via INTRAVENOUS
  Filled 2017-05-23: qty 100

## 2017-05-23 MED ORDER — SODIUM CHLORIDE 0.9 % IV SOLN
INTRAVENOUS | Status: DC
  Administered 2017-05-23: 02:00:00 via INTRAVENOUS

## 2017-05-23 MED ORDER — MAGNESIUM SULFATE 2 GM/50ML IV SOLN
2.0000 g | Freq: Once | INTRAVENOUS | Status: AC
  Administered 2017-05-23: 2 g via INTRAVENOUS
  Filled 2017-05-23: qty 50

## 2017-05-23 MED ORDER — ENOXAPARIN SODIUM 40 MG/0.4ML ~~LOC~~ SOLN
40.0000 mg | SUBCUTANEOUS | Status: DC
  Administered 2017-05-23 – 2017-05-25 (×3): 40 mg via SUBCUTANEOUS
  Filled 2017-05-23 (×3): qty 0.4

## 2017-05-23 MED ORDER — PREDNISONE 20 MG PO TABS
40.0000 mg | ORAL_TABLET | Freq: Every day | ORAL | Status: DC
  Administered 2017-05-24 – 2017-05-27 (×4): 40 mg via ORAL
  Filled 2017-05-23 (×4): qty 2

## 2017-05-23 NOTE — Progress Notes (Signed)
Informed by Lab patient had a critical Trop 0.27. Notified Md Opyd via Amion. Md. Opyd returned the page but did not give any orders since patient remained asymptomatic

## 2017-05-23 NOTE — Evaluation (Signed)
Physical Therapy Evaluation Patient Details Name: Katherine ShamsCary Seago MRN: [id number] DOB: 11/18/1945 Today's Date: 05/23/2017   History of Present Illness  71 y.o. female with medical history significant of hypertension, hyperlipidemia, GERD, diverticulitis, CAD, stent placement, back pain, PVC, GI bleeding, who presents with generalized weakness, SOB, chest discomfort, neck pain, sore throat, dysuria, suprapubic abdominal pain. NSTEMI; possible cystitis  Clinical Impression  Pt admitted with above diagnosis. Pt currently with functional limitations due to the deficits listed below (see PT Problem List). Present with decr activity tolerance effecting walking household distances; Recommend RW for steadiness and to decr the work of walking;  Pt will benefit from skilled PT to increase their independence and safety with mobility to allow discharge to the venue listed below.       Follow Up Recommendations Home health PT    Equipment Recommendations  Rolling walker with 5" wheels    Recommendations for Other Services OT consult     Precautions / Restrictions Precautions Precautions: Fall      Mobility  Bed Mobility Overal bed mobility: Modified Independent                Transfers Overall transfer level: Needs assistance Equipment used: Rolling walker (2 wheeled) Transfers: Sit to/from Stand Sit to Stand: Supervision         General transfer comment: Cues for hand placement and safety  Ambulation/Gait Ambulation/Gait assistance: Min guard Ambulation Distance (Feet): 80 Feet Assistive device: Rolling walker (2 wheeled) Gait Pattern/deviations: Step-through pattern;Decreased step length - right;Decreased step length - left     General Gait Details: cues to self-monitor for activity tolerance; noted considerable fatigue post walk  Stairs            Wheelchair Mobility    Modified Rankin (Stroke Patients Only)       Balance                                              Pertinent Vitals/Pain Pain Assessment: 0-10 Pain Score: 3  Pain Location: abdomen Pain Descriptors / Indicators: Discomfort Pain Intervention(s): Monitored during session;Limited activity within patient's tolerance    Home Living Family/patient expects to be discharged to:: Private residence Living Arrangements: Alone Available Help at Discharge: Family;Friend(s);Available 24 hours/day Type of Home: House       Home Layout: One level Home Equipment: None      Prior Function Level of Independence: Independent         Comments: Had increased difficulty recently doing IADL tasks and friends assisted     Hand Dominance   Dominant Hand: Right    Extremity/Trunk Assessment   Upper Extremity Assessment Upper Extremity Assessment: Overall WFL for tasks assessed    Lower Extremity Assessment Lower Extremity Assessment: Generalized weakness       Communication   Communication: No difficulties  Cognition Arousal/Alertness: Awake/alert Behavior During Therapy: WFL for tasks assessed/performed Overall Cognitive Status: Within Functional Limits for tasks assessed                                        General Comments      Exercises     Assessment/Plan    PT Assessment Patient needs continued PT services  PT Problem List Decreased strength;Decreased balance;Decreased activity tolerance;Decreased mobility;Decreased knowledge of  use of DME;Decreased knowledge of precautions       PT Treatment Interventions DME instruction;Gait training;Functional mobility training;Therapeutic activities;Therapeutic exercise;Balance training;Neuromuscular re-education;Patient/family education    PT Goals (Current goals can be found in the Care Plan section)  Acute Rehab PT Goals Patient Stated Goal: to be independent PT Goal Formulation: With patient Time For Goal Achievement: 06/06/17 Potential to Achieve Goals: Good    Frequency  Min 3X/week   Barriers to discharge        Co-evaluation               AM-PAC PT "6 Clicks" Daily Activity  Outcome Measure Difficulty turning over in bed (including adjusting bedclothes, sheets and blankets)?: None Difficulty moving from lying on back to sitting on the side of the bed? : None Difficulty sitting down on and standing up from a chair with arms (e.g., wheelchair, bedside commode, etc,.)?: A Little Help needed moving to and from a bed to chair (including a wheelchair)?: None Help needed walking in hospital room?: A Little Help needed climbing 3-5 steps with a railing? : A Lot 6 Click Score: 20    End of Session Equipment Utilized During Treatment: Gait belt Activity Tolerance: Patient tolerated treatment well Patient left: in bed;with call bell/phone within reach Nurse Communication: Mobility status PT Visit Diagnosis: Muscle weakness (generalized) (M62.81)    Time: [id number] PT Time Calculation (min) (ACUTE ONLY): 17 min   Charges:   PT Evaluation $PT Eval Low Complexity: 1 Procedure     PT G Codes:        Van ClinesHolly Duchess Armendarez, PT  Acute Rehabilitation Services Pager [phone] Office [phone]   Levi AlandHolly H Rewa Weissberg 05/23/2017, 1:50 PM

## 2017-05-23 NOTE — Progress Notes (Signed)
NURSING PROGRESS NOTE  Katherine ShamsCary App [id number] Admission Data: 05/23/2017 1:06 AM Attending Provider: Lorretta HarpNiu, Xilin, MD QMV:HQIONGPCP:Hunter, Aldine ContesStephen O, MD Code Status: Full   [name] is a 71 y.o. female patient admitted from ED:  -No acute distress noted.  -No complaints of shortness of breath.  -No complaints of chest pain.   Cardiac Monitoring: Box # 03 in place. Cardiac monitor yields:normal sinus rhythm.  Blood pressure (!) 145/66, pulse 90, temperature 98.3 F (36.8 C), temperature source Oral, resp. rate (!) 24, height 5\' 6"  (1.676 m), weight 56.5 kg (124 lb 9 oz), SpO2 100 %.   IV Fluids:  IV in place, occlusive dsg intact without redness, IV cath antecubital right, condition patent and no redness normal saline.   Allergies:  Sulfa drugs cross reactors  Past Medical History:   has a past medical history of Arthritis; Chronic lower back pain; Cluster headache; Coronary artery disease; Diverticulitis; GERD (gastroesophageal reflux disease); Hypercholesteremia; Hypertension; PVC's (premature ventricular contractions); Seizures (HCC); and UTI (lower urinary tract infection).  Past Surgical History:   has a past surgical history that includes Joint replacement; Vascular surgery; Coronary angioplasty with stent (1995); Cardiac catheterization (05/17/2014); Total knee arthroplasty (Right, 2005); Abdominal hysterectomy (1987); Tubal ligation (1970); and left heart catheterization with coronary angiogram (N/A, 05/17/2014).  Social History:   reports that she has never smoked. She has never used smokeless tobacco. She reports that she does not drink alcohol or use drugs.  Skin: intact  Patient/Family orientated to room. Information packet given to patient/family. Admission inpatient armband information verified with patient/family to include name and date of birth and placed on patient arm. Side rails up x 2, fall assessment and education completed with patient/family. Patient/family able to  verbalize understanding of risk associated with falls and verbalized understanding to call for assistance before getting out of bed. Call light within reach. Patient/family able to voice and demonstrate understanding of unit orientation instructions.    Will continue to evaluate and treat per MD orders.

## 2017-05-23 NOTE — Progress Notes (Signed)
RN found pt's IV constantly beeping and it is not infiltrated but , RN flushed IV and pt felt pain, So RN put IV team consultation to assess current IV and give her new one, will continue to monitor

## 2017-05-23 NOTE — Progress Notes (Signed)
PROGRESS NOTE    [name]  ZOX:[id number] DOB: 1946/04/09 DOA: 05/22/2017 PCP: Shelva Majestic, MD   Brief Narrative: 71 y.o. female with medical history significant of hypertension, hyperlipidemia, GERD, diverticulitis, CAD, stent placement, back pain, PVC, GI bleeding, who presents with generalized weakness, SOB, chest discomfort, neck pain, sore throat, dysuria, suprapubic abdominal pain.  ED Course: pt was found to have troponin 0.12-->0.27, negative FOBT, lactic acid 2.018, 122, INR 1.3, BNP 100.5, lipase 33, potassium 3.4, creatinine normal, urinalysis with small amount of leukocyte and many bacteria's, temperature normal, tachycardia, tachypnea, oxygen saturation 100% on room air, chest x-rays negative. CT angiogram of chest is negative for PE.  Assessment & Plan:   # NSTEMI: -Patient presented with chest pain or shortness of breath and found to have serum troponin level of 0.27. Started on IV heparin on admission. -Patient denied chest pain this morning. Evaluated by cardiologist. I discussed with Dr. Diona Browner. He recommended to discontinue IV heparin and obtain echocardiogram. -Continue aspirin, Lipitor, beta blocker, Imdur, lisinopril -Follow up echocardiogram and cardiologist plan. -Follow-up Doppler ultrasound of lower extremities ordered on admission.  #Possible subacute thyroiditis: Patient has thyroid gland tenderness. -Patient with severely suppressed TSH associated with elevated T3 and T4 level on July 24. Patient is on atenolol. Ultrasound of neck consistent with heterogeneous hyperemic thyroid without nodules. Patient will need to follow up with endocrinologist outpatient. May benefit from methimazole, I will start prednisone today to see the response.  # Possible acute cystitis without hematuria: Currently on IV ceftriaxone. Follow up culture results.  #GERD: Continue Protonix  #Hypertension: Continue to monitor blood pressure. Continue current cardiac  medications.  #Hyperlipidemia: Continue Lipitor and Zetia  #Severe hypokalemia and hypomagnesemia: Repleted IV and oral potassium chloride  #Recent history of GI bleed: as per H&P Fecal occult blood test negative. Continue Protonix.  #Moderate protein calorie malnutrition: Continue nutrition consult  #Generalized weakness likely contributed by thyroid disease and comorbidities: Continue PT OT evaluation.  DVT prophylaxis: Lovenox subcutaneous Code Status: Full code Family Communication: No family at bedside Disposition Plan: Likely discharge home in 1-2 days. Patient will require another midnight stay therefore I will order inpatient status.   Consultants:   Cardiology  Procedures: Pending echo Antimicrobials: IV ceftriaxone  Subjective: Seen and examined at bedside. Patient denied chest pain. Shortness of breath is better. Denied headache, dizziness. Has integrated frequency but denied dysuria.  Objective: Vitals:   05/23/17 0000 05/23/17 0030 05/23/17 0653 05/23/17 0925  BP: 140/76 (!) 145/66 (!) 141/71 (!) 130/55  Pulse: 91 90 86   Resp: (!) 25 (!) 24 20   Temp:  98.3 F (36.8 C) 99 F (37.2 C)   TempSrc:  Oral Oral   SpO2: 99% 100% 99%   Weight:  56.5 kg (124 lb 9 oz)    Height:  5\' 6"  (1.676 m)      Intake/Output Summary (Last 24 hours) at 05/23/17 1116 Last data filed at 05/23/17 0700  Gross per 24 hour  Intake          1409.35 ml  Output              400 ml  Net          1009.35 ml   Filed Weights   05/22/17 1503 05/23/17 0030  Weight: 61.2 kg (135 lb) 56.5 kg (124 lb 9 oz)    Examination:  General exam: Appears calm and comfortable  Respiratory system: Clear to auscultation. Respiratory effort normal. No wheezing or  crackle Cardiovascular system: S1 & S2 heard, RRR.  No pedal edema. Gastrointestinal system: Abdomen is nondistended, soft and nontender. Normal bowel sounds heard. Central nervous system: Alert and oriented. No focal neurological  deficits. Extremities: Symmetric 5 x 5 power. Skin: No rashes, lesions or ulcers Psychiatry: Judgement and insight appear normal. Mood & affect appropriate.     Data Reviewed: I have personally reviewed following labs and imaging studies  CBC:  Recent Labs Lab 05/18/17 0856 05/21/17 1122 05/22/17 1829 05/23/17 0514  WBC 4.2 3.6* 4.2 4.6  NEUTROABS  --   --  2.2  --   HGB 10.0* 9.9* 11.5* 10.4*  HCT 30.5* 30.3* 34.9* 32.1*  MCV 93.2 89.6 89.3 88.9  PLT 234.0 276 297 290   Basic Metabolic Panel:  Recent Labs Lab 05/21/17 1122 05/22/17 1829 05/23/17 0514 05/23/17 1002  NA 140 141 140  --   K 3.2* 3.4* 3.0*  --   CL 106 104 104  --   CO2 26 27 27   --   GLUCOSE 117* 108* 99  --   BUN 13 10 8   --   CREATININE 0.83 0.67 0.57  --   CALCIUM 10.1 10.6* 9.7  --   MG  --  1.5*  --  1.8   GFR: Estimated Creatinine Clearance: 58.4 mL/min (by C-G formula based on SCr of 0.57 mg/dL). Liver Function Tests:  Recent Labs Lab 05/21/17 1122 05/22/17 1829  AST 40 36  ALT 28 31  ALKPHOS 50 59  BILITOT 1.0 1.4*  PROT 6.3* 7.3  ALBUMIN 3.1* 3.5    Recent Labs Lab 05/22/17 1829  LIPASE 33   No results for input(s): AMMONIA in the last 168 hours. Coagulation Profile:  Recent Labs Lab 05/22/17 1829  INR 1.30   Cardiac Enzymes:  Recent Labs Lab 05/23/17 0001 05/23/17 0514 05/23/17 1002  TROPONINI 0.27* 0.25* 0.20*   BNP (last 3 results)  Recent Labs  05/10/17 1324  PROBNP 289.0*   HbA1C: No results for input(s): HGBA1C in the last 72 hours. CBG: No results for input(s): GLUCAP in the last 168 hours. Lipid Profile:  Recent Labs  05/23/17 0514  CHOL 75  HDL 25*  LDLCALC 38  TRIG 58  CHOLHDL 3.0   Thyroid Function Tests:  Recent Labs  05/22/17 1829  TSH <0.010*   Anemia Panel: No results for input(s): VITAMINB12, FOLATE, FERRITIN, TIBC, IRON, RETICCTPCT in the last 72 hours. Sepsis Labs:  Recent Labs Lab 05/22/17 1845 05/22/17 2135   LATICACIDVEN 2.18* 1.20    Recent Results (from the past 240 hour(s))  Rapid strep screen (not at [facility])     Status: None   Collection Time: 05/23/17  2:30 AM  Result Value Ref Range Status   Streptococcus, Group A Screen (Direct) NEGATIVE NEGATIVE Final    Comment: (NOTE) A Rapid Antigen test may result negative if the antigen level in the sample is below the detection level of this test. The FDA has not cleared this test as a stand-alone test therefore the rapid antigen negative result has reflexed to a Group A Strep culture.          Radiology Studies: Dg Chest 2 View  Result Date: 05/22/2017 CLINICAL DATA:  Shortness of breath and chest pain EXAM: CHEST  2 VIEW COMPARISON:  Chest radiograph 03/10/2016 FINDINGS: The heart size and mediastinal contours are within normal limits. Both lungs are clear. The visualized skeletal structures are unremarkable. IMPRESSION: No active cardiopulmonary disease. Electronically Signed  By: Deatra Robinson M.D.   On: 05/22/2017 20:01   Ct Head Wo Contrast  Result Date: 05/21/2017 CLINICAL DATA:  Unsteady gait.  Headache. EXAM: CT HEAD WITHOUT CONTRAST TECHNIQUE: Contiguous axial images were obtained from the base of the skull through the vertex without intravenous contrast. COMPARISON:  09/10/2011. FINDINGS: Brain: No evidence of acute infarction, hemorrhage, hydrocephalus, extra-axial collection or mass lesion/mass effect. Vascular: No hyperdense vessel or unexpected calcification. Skull: Stable right lateral skull osteoma. Sinuses/Orbits: Unremarkable. Other: None. IMPRESSION: No acute abnormality. Electronically Signed   By: Beckie Salts M.D.   On: 05/21/2017 12:38   Ct Angio Chest Pe W And/or Wo Contrast  Result Date: 05/22/2017 CLINICAL DATA:  Chest pain, shortness of breath. EXAM: CT ANGIOGRAPHY CHEST WITH CONTRAST TECHNIQUE: Multidetector CT imaging of the chest was performed using the standard protocol during bolus administration of  intravenous contrast. Multiplanar CT image reconstructions and MIPs were obtained to evaluate the vascular anatomy. CONTRAST:  100 mL of Isovue 370 intravenously. COMPARISON:  CT scan of April 06, 2005.  Radiographs of same day. FINDINGS: Cardiovascular: Satisfactory opacification of the pulmonary arteries to the segmental level. No evidence of pulmonary embolism. Normal heart size. No pericardial effusion. Atherosclerosis of thoracic aorta is noted without aneurysm or dissection. Mediastinum/Nodes: No enlarged mediastinal, hilar, or axillary lymph nodes. Thyroid gland, trachea, and esophagus demonstrate no significant findings. Lungs/Pleura: Lungs are clear. No pleural effusion or pneumothorax. Upper Abdomen: No acute abnormality. Musculoskeletal: No chest wall abnormality. No acute or significant osseous findings. Review of the MIP images confirms the above findings. IMPRESSION: No definite evidence of pulmonary embolus. No acute cardiopulmonary abnormality is seen. Aortic Atherosclerosis (ICD10-I70.0). Electronically Signed   By: Lupita Raider, M.D.   On: 05/22/2017 21:22   US Thyroid  Result Date: 05/23/2017 CLINICAL DATA:  Subacute thyroiditis EXAM: THYROID ULTRASOUND TECHNIQUE: Ultrasound examination of the thyroid gland and adjacent soft tissues was performed. COMPARISON:  None. FINDINGS: Parenchymal Echotexture: Mildly heterogenous, hyperemic Isthmus: 0.2 cm thickness Right lobe: 4.6 x 2.3 x 1.8 cm Left lobe: 4.4 x 2 x 1.4 cm _________________________________________________________ Estimated total number of nodules >/= 1 cm: 0 Number of spongiform nodules >/=  2 cm not described below (TR1): 0 Number of mixed cystic and solid nodules >/= 1.5 cm not described below (TR2): 0 _________________________________________________________ No discrete nodules are seen within the thyroid gland. IMPRESSION: 1. Heterogeneous hyperemic thyroid, normal in size without nodule. The above is in keeping with the ACR  TI-RADS recommendations - J Am Coll Radiol 2017;14:587-595. Electronically Signed   By: Corlis Leak M.D.   On: 05/23/2017 10:58        Scheduled Meds: . amLODipine  10 mg Oral Daily  . aspirin  81 mg Oral Once  . aspirin  325 mg Oral Daily  . atenolol  50 mg Oral BID  . atorvastatin  20 mg Oral Daily  . ezetimibe  10 mg Oral Daily  . ferrous sulfate  325 mg Oral Daily  . hydrocortisone  25 mg Rectal BID  . isosorbide mononitrate  120 mg Oral Daily  . lisinopril  20 mg Oral Daily  . oxybutynin  10 mg Oral Daily  . pantoprazole  40 mg Oral BID  . venlafaxine XR  150 mg Oral QHS  . vitamin B-12  100 mcg Oral QODAY  . vitamin C  500 mg Oral Daily  . vitamin E  1,000 Units Oral QODAY   Continuous Infusions: . sodium chloride 75 mL/hr at  05/23/17 0158  . cefTRIAXone (ROCEPHIN)  IV    . potassium chloride 10 mEq (05/23/17 0925)     LOS: 0 days    Dron Jaynie CollinsPrasad Bhandari, MD [facility] Pager [phone]  If 7PM-7AM, please contact night-coverage [email or web address] Password [email or web address] 05/23/2017, 11:16 AM

## 2017-05-23 NOTE — Consult Note (Signed)
Cardiology Consultation:   Patient ID: [name] Nilson; [id number]; 04/05/1946   Admit date: 05/22/2017 Date of Consult: 05/23/2017  Primary Care Provider: [name], Stephen O, MD Primary Cardiologist: Dr. Katrinka BlazingSmith   Patient Profile:   [name] is a 71 y.o. female with a hx of HTN, HLD, subacute thyroiditis, recent GI bleed and CAD with coronary spasm who is being seen today for the evaluation of chest pain and elevated trop at the request of Dr. Lorretta HarpXilin Niu.  History of Present Illness:   [name] is a pleasant 71 year old female with past medical history of hypertension, hyperlipidemia, history of nonobstructive CAD in 2003 with evidence of coronary spasm. Patient underwent cardiac catheterization on 05/17/2014 which continues to show mild to moderate diffuse CAD. She was last seen by Lucile Craterana Dunn PA-C on 06/01/2014. She has been feeling poorly in the last 3 months and claims she has lost roughly 20 pounds. She was diagnosed with low TSH and a high free T4 along with tenderness in the throat area concerning for subacute thyroiditis. In the last 2 months, she has been having abdominal discomfort with blood in the stool. Her hemoglobin reached as low as 9.9. She also has been complaining of generalized weakness, shortness of breath, chest discomfort, abdominal pain, dizziness as well. She eventually sought medical attention again on 05/14/2017. She says the chest discomfort was not the main issue recently, she was actually worried about everything else is occurring. She is unable to tell me how long does each episode of chest discomfort last or how frequently to symptom started. She initially said the chest discomfort has no correlation with exertion but later says it may have some correlation with exertion.   On arrival to [facility], her initial workup revealed elevated troponin, elevated lactic acid of 2.18, potassium low at 3.4, hemoglobin 11.5. Urinalysis showed many bacteria. Blood culture  currently pending. Fecal occult blood test was negative. D-dimer was elevated, CTA of the chest was negative for PE. Troponin eventually went up to 0.27 before staying flat at 0.25 subsequently. Cardiology has been consulted for chest discomfort with elevated troponin.   Past Medical History:  Diagnosis Date  . Arthritis   . Chronic lower back pain   . Cluster headache   . Coronary artery disease    a. 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 non obs dz LHC 2007. c. Non obs dz (mild-mod) [facility] 05[facility]17/[facility].   . Diverticulitis    a. Hx microperf 2012 - hospitalizated.  Marland Kitchen. GERD (gastroesophageal reflux disease)   . Hypercholesteremia   . Hypertension   . PVC's (premature ventricular contractions)    a. Hx of trigeminy/bigeminy.  . Seizures (HCC)   . UTI (lower urinary tract infection)     Past Surgical History:  Procedure Laterality Date  . ABDOMINAL HYSTERECTOMY  1987   "partial"-still has ovaries  . CARDIAC CATHETERIZATION  05/17/2014  . CORONARY ANGIOPLASTY WITH STENT PLACEMENT  1995   "1"  . JOINT REPLACEMENT     right knee  . LEFT HEART CATHETERIZATION WITH CORONARY ANGIOGRAM N/A 05/17/2014   Procedure: LEFT HEART CATHETERIZATION WITH CORONARY ANGIOGRAM;  Surgeon: Marykay Lexavid W Harding, MD;  [facility];  Service: Cardiovascular;  Laterality: N/A;  . TOTAL KNEE ARTHROPLASTY Right 2005  . TUBAL LIGATION  1970  . VASCULAR SURGERY       Inpatient Medications: Scheduled Meds: . amLODipine  10 mg Oral Daily  . aspirin  81 mg Oral Once  . aspirin  325  mg Oral Daily  . atenolol  50 mg Oral BID  . atorvastatin  20 mg Oral Daily  . ezetimibe  10 mg Oral Daily  . ferrous sulfate  325 mg Oral Daily  . hydrocortisone  25 mg Rectal BID  . isosorbide mononitrate  120 mg Oral Daily  . lisinopril  20 mg Oral Daily  . oxybutynin  10 mg Oral Daily  . pantoprazole  40 mg Oral BID  . venlafaxine XR  150 mg Oral QHS  . vitamin B-12  100 mcg Oral QODAY  . vitamin C  500 mg Oral Daily  . vitamin E   1,000 Units Oral QODAY   Continuous Infusions: . sodium chloride 75 mL/hr at 05/23/17 0158  . cefTRIAXone (ROCEPHIN)  IV    . heparin 650 Units/hr (05/23/17 0206)  . potassium chloride 10 mEq (05/23/17 0815)   PRN Meds: acetaminophen, hydrALAZINE, morphine injection, ondansetron, traMADol, zolpidem  Allergies:    Allergies  Allergen Reactions  . Sulfa Drugs Cross Reactors Shortness Of Breath and Palpitations    Social History:   Social History   Social History  . Marital status: Married    Spouse name: N/A  . Number of children: 4  . Years of education: 4   Occupational History  .      retired   Social History Main Topics  . Smoking status: Never Smoker  . Smokeless tobacco: Never Used  . Alcohol use No  . Drug use: No  . Sexual activity: No   Other Topics Concern  . Not on file   Social History Narrative   Separated. 4 children from first marriage. 16 grandkids.       Retired from [employer] for 25 years, went to [employer] for 5 years. Retired 2003 after MI      Hobbies: babysit/active with children      Right-handed      Caffeine: 24 oz soda per day          Family History:   The patient's family history includes CAD in her brother and father; Diabetes in her brother, father, mother, and sister.  ROS:  Please see the history of present illness.  ROS  All other ROS reviewed and negative.     Physical Exam/Data:   Vitals:   05/22/17 2330 05/23/17 0000 05/23/17 0030 05/23/17 0653  BP: (!) 147/54 140/76 (!) 145/66 (!) 141/71  Pulse: 97 91 90 86  Resp: 20 (!) 25 (!) 24 20  Temp:   98.3 F (36.8 C) 99 F (37.2 C)  TempSrc:   Oral Oral  SpO2: 99% 99% 100% 99%  Weight:   124 lb 9 oz (56.5 kg)   Height:   5\' 6"  (1.676 m)     Intake/Output Summary (Last 24 hours) at 05/23/17 0858 Last data filed at 05/23/17 0700  Gross per 24 hour  Intake          1409.35 ml  Output              400 ml  Net          1009.35 ml   Filed Weights   05/22/17 1503  05/23/17 0030  Weight: 135 lb (61.2 kg) 124 lb 9 oz (56.5 kg)   Body mass index is 20.1 kg/m.  General:  well developed, in no acute distress +thin cachectic HEENT: normal Lymph: no adenopathy Neck: no JVD Endocrine:  No thryomegaly Vascular: No carotid bruits; FA pulses 2+ bilaterally without  bruits  Cardiac:  normal S1, S2; RRR; no murmur  Lungs:  clear to auscultation bilaterally, no wheezing, rhonchi or rales  Abd: soft, nontender, no hepatomegaly  Ext: no edema Musculoskeletal:  No deformities, BUE and BLE strength normal and equal Skin: warm and dry  Neuro:  CNs 2-12 intact, no focal abnormalities noted Psych:  Normal affect   EKG:  The EKG was personally reviewed and demonstrates:  Sinus rhythm with LVH and a short PR interval Telemetry:  Telemetry was personally reviewed and demonstrates:  Normal sinus rhythm without significant ventricular ectopy  Relevant CV Studies:  Cath 05/31/2002  CONCLUSION:  1. Intense, diffuse left and right coronary spasm relieved with     intracoronary nitroglycerin as described above.  2. There was a 50% mid to distal left anterior descending artery lesion     unchanged from 1998.  There is perhaps 30 to 50% narrowing in the ostium     of the second diagonal that also arises from this same region of the left     anterior descending artery.  Circumflex and right coronary contain     luminal irregularities but no significant obstructive lesions after     intracoronary nitroglycerin.  3. Regional wall motion abnormality with inferoapical mild to moderate     hypokinesis.  4. The patient's diagnosis at this time appears to be coronary artery spasm     with non-ST elevation acute infarction relieved with medical therapy.   PLAN:  1. Because the platelet count is dropping, I would discontinue Integrilin     and heparin.  2. Continue aspirin and Plavix.  3. Continue IV nitroglycerin and convert to oral long-acting nitrates.  4. Resume calcium  channel blocker therapy.  5. Close clinical followup.   Cath 05/17/2014 Left Ventriculography:  EF:  50 - 55  %  Wall Motion:  mid inferior hypokinesis   Coronary Anatomy:  Dominance: Right   Left Main: Large caliber vessel that bifurcates into the LAD and Circumflex; angiographically normal  LAD:  large caliber vessel it gives off 2 small moderate caliber diagonal branches. Just after the takeoff of the second branch there is a roughly 40-50% stenosis. The vessel he continues on with minimal luminal regular is as it reaches around the apex. The vessel tapers quite dramatically but responded well to nitroglycerin.  Both diagonal branches are small moderate in caliber with the D1 being slightly larger than D2  Left Circumflex:  large caliber, nondominant vessel which essentially courses is a large inferolateral OM branch. They're to proximal OM 1 and OM 2 branches that are small moderate and small in caliber respectively. The vessel then bifurcates into OM3 and OM4.  OM 3 is moderate caliber with minimal luminal irregularities while to follow on branch coursing into OM 4 has a focal eccentric 30-40% stenosis.    RCA:  large caliber, dominant vessel with a slightly anterior takeoff. There is a roughly 40% diffuse stenosis in the mid vessel. The vessel and biphasic distally into the Right Posterior Descending Artery (RPDA) and the Right Posterior AV Groove Branch (RPAV). Mild luminal irregularities otherwise.   RPDA:  moderate-caliber vessel which reaches almost all the way to the apex. Tortuous but free of any significant disease.  RPL Sysytem:The RPAV  begin is a moderate caliber vessel it gives rise to several posterior lateral branches at least 2 are significant branches but still are small diameter. Mild diffuse luminal irregularities but otherwise no significant disease.   PATIENT DISPOSITION:  The patient was transferred to the PACU holding area in a hemodynamicaly stable,  chest pain free condition.  The patient tolerated the procedure well, and there were no complications.  EBL:   <  10  ml  The patient was stable before, during, and after the procedure.  POST-OPERATIVE DIAGNOSIS:    Mild-to-moderate diffuse CAD but no culprit lesion to explain unstable angina.  Low normal LVEF with normal EP   there was response to the coronary nitroglycerin in the LAD especially.  PLAN OF CARE:  Return to the nursing unit for standard post radial Care.   Would continue to optimize medical therapy.   Anticipate discharge either later on today or tomorrow depending primary team evaluation.    Laboratory Data:  Chemistry  Recent Labs Lab 05/21/17 1122 05/22/17 1829 05/23/17 0514  NA 140 141 140  K 3.2* 3.4* 3.0*  CL 106 104 104  CO2 26 27 27   GLUCOSE 117* 108* 99  BUN 13 10 8   CREATININE 0.83 0.67 0.57  CALCIUM 10.1 10.6* 9.7  GFRNONAA >60 >60 >60  GFRAA >60 >60 >60  ANIONGAP 8 10 9      Recent Labs Lab 05/21/17 1122 05/22/17 1829  PROT 6.3* 7.3  ALBUMIN 3.1* 3.5  AST 40 36  ALT 28 31  ALKPHOS 50 59  BILITOT 1.0 1.4*   Hematology  Recent Labs Lab 05/21/17 1122 05/22/17 1829 05/23/17 0514  WBC 3.6* 4.2 4.6  RBC 3.38* 3.91 3.61*  HGB 9.9* 11.5* 10.4*  HCT 30.3* 34.9* 32.1*  MCV 89.6 89.3 88.9  MCH 29.3 29.4 28.8  MCHC 32.7 33.0 32.4  RDW 13.8 14.0 13.9  PLT 276 297 290   Cardiac Enzymes  Recent Labs Lab 05/23/17 0001 05/23/17 0514  TROPONINI 0.27* 0.25*     Recent Labs Lab 05/22/17 1843 05/22/17 2134  TROPIPOC 0.15* 0.12*    BNP  Recent Labs Lab 05/22/17 1829  BNP 900.5*    DDimer   Recent Labs Lab 05/22/17 1829  DDIMER 1.42*    Radiology/Studies:  Dg Chest 2 View  Result Date: 05/22/2017 CLINICAL DATA:  Shortness of breath and chest pain EXAM: CHEST  2 VIEW COMPARISON:  Chest radiograph 03/10/2016 FINDINGS: The heart size and mediastinal contours are within normal limits. Both lungs are clear.  The visualized skeletal structures are unremarkable. IMPRESSION: No active cardiopulmonary disease. Electronically Signed   By: Deatra RobinsonKevin  Herman M.D.   On: 05/22/2017 20:01   Ct Head Wo Contrast  Result Date: 05/21/2017 CLINICAL DATA:  Unsteady gait.  Headache. EXAM: CT HEAD WITHOUT CONTRAST TECHNIQUE: Contiguous axial images were obtained from the base of the skull through the vertex without intravenous contrast. COMPARISON:  09/10/2011. FINDINGS: Brain: No evidence of acute infarction, hemorrhage, hydrocephalus, extra-axial collection or mass lesion/mass effect. Vascular: No hyperdense vessel or unexpected calcification. Skull: Stable right lateral skull osteoma. Sinuses/Orbits: Unremarkable. Other: None. IMPRESSION: No acute abnormality. Electronically Signed   By: Beckie SaltsSteven  Reid M.D.   On: 05/21/2017 12:38   Ct Angio Chest Pe W And/or Wo Contrast  Result Date: 05/22/2017 CLINICAL DATA:  Chest pain, shortness of breath. EXAM: CT ANGIOGRAPHY CHEST WITH CONTRAST TECHNIQUE: Multidetector CT imaging of the chest was performed using the standard protocol during bolus administration of intravenous contrast. Multiplanar CT image reconstructions and MIPs were obtained to evaluate the vascular anatomy. CONTRAST:  100 mL of Isovue 370 intravenously. COMPARISON:  CT scan of April 06, 2005.  Radiographs of same day. FINDINGS: Cardiovascular: Satisfactory opacification of the  pulmonary arteries to the segmental level. No evidence of pulmonary embolism. Normal heart size. No pericardial effusion. Atherosclerosis of thoracic aorta is noted without aneurysm or dissection. Mediastinum/Nodes: No enlarged mediastinal, hilar, or axillary lymph nodes. Thyroid gland, trachea, and esophagus demonstrate no significant findings. Lungs/Pleura: Lungs are clear. No pleural effusion or pneumothorax. Upper Abdomen: No acute abnormality. Musculoskeletal: No chest wall abnormality. No acute or significant osseous findings. Review of the MIP  images confirms the above findings. IMPRESSION: No definite evidence of pulmonary embolus. No acute cardiopulmonary abnormality is seen. Aortic Atherosclerosis (ICD10-I70.0). Electronically Signed   By: Lupita Raider, M.D.   On: 05/22/2017 21:22    Assessment and Plan:   1. Chest discomfort with elevated troponin: She has multiple issues going on at this time with different complaints likely related to hyperthyroidism. With recent possible GI bleed, she will be a poor candidate for PCI. I would recommend echocardiogram with possible stress test.  2. Subacute thyroiditis with hyperthyroidism: Free T3 12.8 five days ago, T4 also elevated with TSH low. Likely also contributed to her losing 20 pounds recently.  3. Possible UTI: Receiving IV Rocephin  4. History of nonobstructive CAD with coronary spasm: Noted to have diffuse moderate disease in 2003.  5. Hypertension  6. Hyperlipidemia  7. Hypokalemia: Replete  8. Possible GI bleed: FOBT negative, however per patient she has been having blood in the stool for the past 2 months.  Ramond Dial, [place]  05/23/2017 8:58 AM   Attending note:  Patient seen and examined. Reviewed records and discussed the case with Mr. Eyvonne Left. Ms. Chiles presents with a variety of symptoms over the last few months including weakness, anorexia and weight loss, fatigue with activity, abdominal discomfort, and intermittent leg swelling. She has also experienced recent intermittent chest discomfort, somewhat vague in description, not specifically exertional. Additional problems include recent GI bleeding and also subacute thyroiditis with hyperthyroidism. She has a history of nonobstructive CAD most recently documented in 2015 by cardiac catheterization.  On examination she is in no distress. She is in sinus rhythm by telemetry, heart rate in the 80s and systolic blood pressure running 130s to 140s. Lungs are clear with diminished breath sounds. Cardiac exam  reveals RRR no gallop. She has no peripheral edema. Lab work shows creatinine 0.57, hemoglobin 10.4, LDL 38, he is negative, and troponin I mildly elevated at 0.15, 0.12, 0.27, and 0.25. ECG shows sinus rhythm with chronic short PR interval, no acute ST segment changes. Chest CTA shows no evidence of pulmonary embolus.  At this point would recommend updating echocardiogram to ensure stability in LVEF, exclude cardiomyopathy. Chest pain symptoms are somewhat vague and her troponin I pattern is not particularly suggestive of a definitive ACS. If there is a decline in LVEF, then repeat ischemic evaluation can be considered.  Jonelle Sidle, M.D., F.A.C.C.

## 2017-05-23 NOTE — Progress Notes (Signed)
  Echocardiogram 2D Echocardiogram has been performed.  [name] [name] 05/23/2017, 2:39 PM

## 2017-05-23 NOTE — Progress Notes (Signed)
Pt started HH diet since this AM, tolerating well has gone through ECHO and USG today, PT worked with her, pt got total 4 ride of K+ and 1 dose of MG+, pt is stable, vitals stable, tramadol 50mg  given for one time complain of headache, will continue to monitor

## 2017-05-23 NOTE — Progress Notes (Signed)
ANTICOAGULATION CONSULT NOTE - Initial Consult  Pharmacy Consult for heparin Indication: chest pain/ACS  Allergies  Allergen Reactions  . Sulfa Drugs Cross Reactors Shortness Of Breath and Palpitations    Patient Measurements: Height: 5\' 6"  (167.6 cm) Weight: 124 lb 9 oz (56.5 kg) IBW/kg (Calculated) : 59.3  Vital Signs: Temp: 98.3 F (36.8 C) (07/29 0030) Temp Source: Oral (07/29 0030) BP: 145/66 (07/29 0030) Pulse Rate: 90 (07/29 0030)  Labs:  Recent Labs  05/21/17 1122 05/22/17 1829 05/23/17 0001  HGB 9.9* 11.5*  --   HCT 30.3* 34.9*  --   PLT 276 297  --   LABPROT  --  16.2*  --   INR  --  1.30  --   CREATININE 0.83 0.67  --   TROPONINI  --   --  0.27*    Estimated Creatinine Clearance: 58.4 mL/min (by C-G formula based on SCr of 0.67 mg/dL).   Medical History: Past Medical History:  Diagnosis Date  . Arthritis   . Chronic lower back pain   . Cluster headache   . Coronary artery disease    a. 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 non obs dz LHC 2007. c. [facility]-mod) [facility].   . Diverticulitis    a. Hx microperf 2012 - hospitalizated.  Marland Kitchen. GERD (gastroesophageal reflux disease)   . Hypercholesteremia   . Hypertension   . PVC's (premature ventricular contractions)    a. Hx of trigeminy/bigeminy.  . Seizures (HCC)   . UTI (lower urinary tract infection)     Medications:  Prescriptions Prior to Admission  Medication Sig Dispense Refill Last Dose  . ACAI PO Take 1 capsule by mouth daily.   Past Week at Unknown time  . acetaminophen (TYLENOL) 325 MG tablet Take 650 mg by mouth every 6 (six) hours as needed for moderate pain.    Past Week at Unknown time  . Aloe Vera 25 MG CAPS Take 25 mg by mouth daily.    Past Week at Unknown time  . amLODipine (NORVASC) 10 MG tablet take 1 tablet by mouth once daily (Patient taking differently: take 10 mg by mouth once daily) 30 tablet 5 Past Week at Unknown time  . aspirin 325 MG tablet Take 325 mg by mouth 3  (three) times a week.    Past Week at Unknown time  . aspirin EC 81 MG EC tablet Take 1 tablet (81 mg total) by mouth daily.   Past Week at Unknown time  . atorvastatin (LIPITOR) 20 MG tablet take 1 tablet by mouth once daily (Patient taking differently: take 20 mg by mouth once daily) 90 tablet 1 Past Week at Unknown time  . Bee Pollen 550 MG CAPS Take 550 mg by mouth daily.    Past Week at Unknown time  . carvedilol (COREG) 6.25 MG tablet take 1 tablet by mouth twice a day with meals (Patient taking differently: take 6.25 mg by mouth twice a day with meals) 60 tablet 5 Past Week at Unknown time  . cholecalciferol (VITAMIN D) 400 UNITS TABS tablet Take 400 Units by mouth every other day.    Past Week at Unknown time  . Cyanocobalamin (VITAMIN B 12 PO) Take 1 capsule by mouth every other day.    Past Week at Unknown time  . esomeprazole (NEXIUM) 40 MG capsule Take 1 capsule (40 mg total) by mouth daily. (Patient taking differently: Take 40 mg by mouth 2 (two) times daily. ) 90 capsule  3 Past Week at Unknown time  . ezetimibe (ZETIA) 10 MG tablet take 1 tablet by mouth once daily (Patient taking differently: take 10 mg by mouth once daily) 30 tablet 5 Past Week at Unknown time  . ferrous sulfate 325 (65 FE) MG tablet Take 1 tablet (325 mg total) by mouth daily. 30 tablet 0 Past Week at Unknown time  . hydrocortisone (ANUSOL-HC) 25 MG suppository Place 1 suppository (25 mg total) rectally 2 (two) times daily. 12 suppository 0 Past Week at Unknown time  . isosorbide mononitrate (IMDUR) 60 MG 24 hr tablet take 2 tablets by mouth once daily (Patient taking differently: take 120 mg by mouth once daily) 60 tablet 5 Past Week at Unknown time  . lisinopril (PRINIVIL,ZESTRIL) 20 MG tablet take 1 tablet by mouth once daily (Patient taking differently: take 20 mg by mouth once daily) 90 tablet 1 Past Week at Unknown time  . nitroGLYCERIN (NITROSTAT) 0.4 MG SL tablet place 1 tablet under the tongue if needed every 5  minutes for chest pain for 3 doses (Patient taking differently: place 0.4 mg under the tongue if needed every 5 minutes for chest pain for 3 doses) 25 tablet 1 Past Week at Unknown time  . oxybutynin (DITROPAN-XL) 10 MG 24 hr tablet Take 1 tablet (10 mg total) by mouth daily. 90 tablet 3 Past Week at Unknown time  . potassium chloride (KLOR-CON 10) 10 MEQ tablet Take 1 tablet (10 mEq total) by mouth 2 (two) times daily. 60 tablet 5 Past Week at Unknown time  . traMADol (ULTRAM) 50 MG tablet take 1 tablet by mouth up to once daily for migraines (Patient taking differently: Take 50-150 mg by mouth every 6 (six) hours as needed for moderate pain. ) 90 tablet 1 Past Week at Unknown time  . venlafaxine XR (EFFEXOR-XR) 150 MG 24 hr capsule take 1 capsule by mouth at bedtime (Patient taking differently: take 150 mg by mouth at bedtime) 30 capsule 5 Past Week at Unknown time  . vitamin C (ASCORBIC ACID) 500 MG tablet Take 500 mg by mouth every other day.    Past Week at Unknown time  . vitamin E (VITAMIN E) 1000 UNIT capsule Take 1,000 Units by mouth every other day.    05/20/2017 at Unknown time   Scheduled:  . amLODipine  10 mg Oral Daily  . aspirin  81 mg Oral Once  . aspirin  325 mg Oral Daily  . atenolol  50 mg Oral BID  . atorvastatin  20 mg Oral Daily  . ezetimibe  10 mg Oral Daily  . ferrous sulfate  325 mg Oral Daily  . hydrocortisone  25 mg Rectal BID  . isosorbide mononitrate  120 mg Oral Daily  . lisinopril  20 mg Oral Daily  . oxybutynin  10 mg Oral Daily  . pantoprazole  40 mg Oral BID  . venlafaxine XR  150 mg Oral QHS  . vitamin B-12  100 mcg Oral QODAY  . vitamin C  500 mg Oral Daily  . vitamin E  1,000 Units Oral QODAY   Infusions:  . sodium chloride    . cefTRIAXone (ROCEPHIN)  IV      Assessment: 71yo female c/o generalized weakness, initial istat troponins mildly elevated but now rising, to begin heparin; of note pt has noted dark tarry stool, Hgb up from ED visit 1d ago,  FOBT negative.  Goal of Therapy:  Heparin level 0.3-0.7 units/ml Monitor platelets by anticoagulation protocol:  Yes   Plan:  Will give heparin 2000 units IV bolus x1 followed by gtt at 650 units/hr and monitor heparin levels and CBC.  Vernard GamblesVeronda Eren [place]uebla, PharmD, BCPS  05/23/2017,1:45 AM

## 2017-05-23 NOTE — Progress Notes (Signed)
Occupational Therapy Evaluation Patient Details Name: Katherine ShamsCary Lorimer MRN: [id number] DOB: 11/11/1945 Today's Date: 05/23/2017    History of Present Illness 71 y.o. female with medical history significant of hypertension, hyperlipidemia, GERD, diverticulitis, CAD, stent placement, back pain, PVC, GI bleeding, who presents with generalized weakness, SOB, chest discomfort, neck pain, sore throat, dysuria, suprapubic abdominal pain.   Clinical Impression   PTA, pt lived alone and was independent with ADL, however, pt states she has not felt good x 3 months and her neighbors have been assisting with IADL tasks. Feel pt will progress to being able to DC home with initial 24/7 S and HHOT. Pt states she has family/friends who can be with her 24/7 if needed. Will follow acutely to address established goals and facilitate safe DC home.  After activity, BP 133/55. HR 83.     Follow Up Recommendations  Home health OT;Supervision/Assistance - 24 hour (initially)    Equipment Recommendations  3 in 1 bedside commode;Other (comment) (RW)    Recommendations for Other Services       Precautions / Restrictions Precautions Precautions: Fall      Mobility Bed Mobility Overal bed mobility: Modified Independent                Transfers Overall transfer level: Needs assistance Equipment used: Rolling walker (2 wheeled) Transfers: Sit to/from Stand Sit to Stand: Supervision              Balance                                           ADL either performed or assessed with clinical judgement   ADL Overall ADL's : Needs assistance/impaired   Eating/Feeding Details (indicate cue type and reason): NPO Grooming: Set up;Standing   Upper Body Bathing: Set up;Sitting   Lower Body Bathing: Set up;Supervison/ safety;Sit to/from stand   Upper Body Dressing : Set up;Sitting   Lower Body Dressing: Min guard;Sit to/from stand   Toilet Transfer: Min  guard;RW;Ambulation;Comfort height toilet   Toileting- ArchitectClothing Manipulation and Hygiene: Supervision/safety;Sit to/from stand       Functional mobility during ADLs: Rolling walker;Minimal assistance General ADL Comments: Pt with minimal unsteadiness when ambulating     Vision Baseline Vision/History: Wears glasses       Perception     Praxis      Pertinent Vitals/Pain Pain Assessment: 0-10 Pain Score: 3  Pain Location: abdomen Pain Descriptors / Indicators: Discomfort Pain Intervention(s): Limited activity within patient's tolerance     Hand Dominance Right   Extremity/Trunk Assessment Upper Extremity Assessment Upper Extremity Assessment: Overall WFL for tasks assessed   Lower Extremity Assessment Lower Extremity Assessment: Defer to PT evaluation   Cervical / Trunk Assessment Cervical / Trunk Assessment: Normal   Communication Communication Communication: No difficulties   Cognition Arousal/Alertness: Awake/alert Behavior During Therapy: WFL for tasks assessed/performed Overall Cognitive Status: Within Functional Limits for tasks assessed                                     General Comments       Exercises     Shoulder Instructions      Home Living Family/patient expects to be discharged to:: Private residence Living Arrangements: Alone Available Help at Discharge: Family;Friend(s);Available 24 hours/day Type of Home: House  Home Layout: One level     Bathroom Shower/Tub: Chief Strategy OfficerTub/shower unit   Bathroom Toilet: Standard     Home Equipment: None          Prior Functioning/Environment Level of Independence: Independent        Comments: Had increased difficulty recently doing IADL tasks and friends assisted        OT Problem List: Decreased strength;Decreased activity tolerance;Impaired balance (sitting and/or standing);Decreased knowledge of use of DME or AE;Pain      OT Treatment/Interventions: Self-care/ADL  training;Therapeutic exercise;Energy conservation;DME and/or AE instruction;Therapeutic activities;Balance training;Patient/family education    OT Goals(Current goals can be found in the care plan section) Acute Rehab OT Goals Patient Stated Goal: to be independent OT Goal Formulation: With patient Time For Goal Achievement: 06/06/17 Potential to Achieve Goals: Good ADL Goals Pt Will Perform Lower Body Bathing: with modified independence;sit to/from stand Pt Will Perform Lower Body Dressing: with modified independence;sit to/from stand Pt Will Transfer to Toilet: with modified independence;ambulating Additional ADL Goal #1: Pt will verbalize 3 energy conservation strategies independently Additional ADL Goal #2: Pt will independently verbalize 3 strategies to reduce risk of falls  OT Frequency: Min 2X/week   Barriers to D/C:            Co-evaluation              AM-PAC PT "6 Clicks" Daily Activity     Outcome Measure Help from another person eating meals?: None Help from another person taking care of personal grooming?: None Help from another person toileting, which includes using toliet, bedpan, or urinal?: A Little Help from another person bathing (including washing, rinsing, drying)?: A Little Help from another person to put on and taking off regular upper body clothing?: None Help from another person to put on and taking off regular lower body clothing?: A Little 6 Click Score: 21   End of Session Equipment Utilized During Treatment: Rolling walker;Gait belt Nurse Communication: Mobility status  Activity Tolerance: Patient tolerated treatment well Patient left: in bed;with bed alarm set;with call bell/phone within reach;with nursing/sitter in room  OT Visit Diagnosis: Unsteadiness on feet (R26.81);Muscle weakness (generalized) (M62.81);Pain Pain - part of body:  (abdomen)                Time: [id number] OT Time Calculation (min): 15 min Charges:  OT General  Charges $OT Visit: 1 Procedure OT Evaluation $OT Eval Moderate Complexity: 1 Procedure G-Codes:     Trust Leh, OT/L  [phone] 05/23/2017  Gunther Zawadzki,HILLARY 05/23/2017, 8:22 AM

## 2017-05-23 NOTE — Progress Notes (Addendum)
VASCULAR LAB PRELIMINARY  PRELIMINARY  PRELIMINARY  PRELIMINARY  Bilateral lower extremity venous duplex completed.    Preliminary report:  Bilateral:  No evidence of DVTor superficial thrombosis. No evidence of a baker's cyst on the left. Findings consisted with a Baker's cyst possibly ruptured measuring 4.1 cm X1.29 cm   Iowa Kappes, RVS 05/23/2017, 4:23 PM

## 2017-05-24 DIAGNOSIS — I10 Essential (primary) hypertension: Secondary | ICD-10-CM

## 2017-05-24 DIAGNOSIS — I248 Other forms of acute ischemic heart disease: Secondary | ICD-10-CM

## 2017-05-24 DIAGNOSIS — I2510 Atherosclerotic heart disease of native coronary artery without angina pectoris: Secondary | ICD-10-CM

## 2017-05-24 DIAGNOSIS — R7989 Other specified abnormal findings of blood chemistry: Secondary | ICD-10-CM

## 2017-05-24 LAB — URINE CULTURE: Culture: >=100000 — AB

## 2017-05-24 LAB — CBC
HCT: 29.8 % — ABNORMAL LOW (ref 36.0–46.0)
Hemoglobin: 9.6 g/dL — ABNORMAL LOW (ref 12.0–15.0)
MCH: 28.7 pg (ref 26.0–34.0)
MCHC: 32.2 g/dL (ref 30.0–36.0)
MCV: 89.2 fL (ref 78.0–100.0)
PLATELETS: 265 10*3/uL (ref 150–400)
RBC: 3.34 MIL/uL — ABNORMAL LOW (ref 3.87–5.11)
RDW: 14 % (ref 11.5–15.5)
WBC: 4.9 10*3/uL (ref 4.0–10.5)

## 2017-05-24 LAB — BASIC METABOLIC PANEL
Anion gap: 6 (ref 5–15)
BUN: 15 mg/dL (ref 6–20)
CALCIUM: 10 mg/dL (ref 8.9–10.3)
CO2: 26 mmol/L (ref 22–32)
CREATININE: 0.68 mg/dL (ref 0.44–1.00)
Chloride: 108 mmol/L (ref 101–111)
Glucose, Bld: 93 mg/dL (ref 65–99)
Potassium: 3.5 mmol/L (ref 3.5–5.1)
SODIUM: 140 mmol/L (ref 135–145)

## 2017-05-24 LAB — GLUCOSE, CAPILLARY: Glucose-Capillary: 110 mg/dL — ABNORMAL HIGH (ref 65–99)

## 2017-05-24 LAB — HEMOGLOBIN A1C
Hgb A1c MFr Bld: 5.8 % — ABNORMAL HIGH (ref 4.8–5.6)
Mean Plasma Glucose: 120 mg/dL

## 2017-05-24 LAB — MAGNESIUM: MAGNESIUM: 1.9 mg/dL (ref 1.7–2.4)

## 2017-05-24 MED ORDER — MAGNESIUM HYDROXIDE 400 MG/5ML PO SUSP
30.0000 mL | Freq: Once | ORAL | Status: AC
  Administered 2017-05-24: 30 mL via ORAL
  Filled 2017-05-24: qty 30

## 2017-05-24 MED ORDER — POTASSIUM CHLORIDE CRYS ER 20 MEQ PO TBCR
40.0000 meq | EXTENDED_RELEASE_TABLET | Freq: Once | ORAL | Status: AC
  Administered 2017-05-24: 40 meq via ORAL
  Filled 2017-05-24: qty 2

## 2017-05-24 MED ORDER — CEFDINIR 125 MG/5ML PO SUSR
250.0000 mg | Freq: Two times a day (BID) | ORAL | Status: AC
  Administered 2017-05-24 – 2017-05-26 (×6): 250 mg via ORAL
  Filled 2017-05-24 (×6): qty 10

## 2017-05-24 MED ORDER — ENSURE ENLIVE PO LIQD
237.0000 mL | Freq: Two times a day (BID) | ORAL | Status: DC
  Administered 2017-05-24 – 2017-05-27 (×3): 237 mL via ORAL

## 2017-05-24 NOTE — Progress Notes (Signed)
Progress Note  Patient Name: Sachi Boulay Date of Encounter: 05/24/2017  Primary Cardiologist: Dr Katrinka Blazing  Subjective   No chest pain today.  Inpatient Medications    Scheduled Meds: . amLODipine  10 mg Oral Daily  . aspirin  81 mg Oral Once  . aspirin  325 mg Oral Daily  . atenolol  50 mg Oral BID  . atorvastatin  20 mg Oral Daily  . cefdinir  250 mg Oral BID  . enoxaparin (LOVENOX) injection  40 mg Subcutaneous Q24H  . ezetimibe  10 mg Oral Daily  . feeding supplement (ENSURE ENLIVE)  237 mL Oral BID BM  . ferrous sulfate  325 mg Oral Daily  . hydrocortisone  25 mg Rectal BID  . isosorbide mononitrate  120 mg Oral Daily  . lisinopril  20 mg Oral Daily  . oxybutynin  10 mg Oral Daily  . pantoprazole  40 mg Oral BID  . predniSONE  40 mg Oral Q breakfast  . venlafaxine XR  150 mg Oral QHS  . vitamin B-12  100 mcg Oral QODAY  . vitamin C  500 mg Oral Daily  . vitamin E  1,000 Units Oral QODAY   Continuous Infusions:  PRN Meds: acetaminophen, hydrALAZINE, morphine injection, ondansetron, traMADol, zolpidem   Vital Signs    Vitals:   05/23/17 1216 05/23/17 2029 05/24/17 0559 05/24/17 0959  BP: (!) 104/42 118/78 (!) 151/87 (!) 141/74  Pulse: 80 77 80 83  Resp: 20 20 20    Temp: 98.6 F (37 C) 98.2 F (36.8 C) 98.4 F (36.9 C)   TempSrc: Oral Oral Oral   SpO2: 98% 100% 99% 95%  Weight:   125 lb 3.2 oz (56.8 kg)   Height:        Intake/Output Summary (Last 24 hours) at 05/24/17 1155 Last data filed at 05/24/17 0800  Gross per 24 hour  Intake             1955 ml  Output             1050 ml  Net              905 ml   Filed Weights   05/22/17 1503 05/23/17 0030 05/24/17 0559  Weight: 135 lb (61.2 kg) 124 lb 9 oz (56.5 kg) 125 lb 3.2 oz (56.8 kg)    Telemetry    SR - Personally Reviewed  ECG    SR - Personally Reviewed  Physical Exam   GEN: No acute distress.   Neck: No JVD Cardiac: RRR, no murmurs, rubs, or gallops.  Respiratory: Clear to  auscultation bilaterally. GI: Soft, nontender, non-distended  MS: No edema; No deformity. Neuro:  Nonfocal  Psych: Normal affect   Labs    Chemistry Recent Labs Lab 05/21/17 1122 05/22/17 1829 05/23/17 0514 05/24/17 0635  NA 140 141 140 140  K 3.2* 3.4* 3.0* 3.5  CL 106 104 104 108  CO2 26 27 27 26   GLUCOSE 117* 108* 99 93  BUN 13 10 8 15   CREATININE 0.83 0.67 0.57 0.68  CALCIUM 10.1 10.6* 9.7 10.0  PROT 6.3* 7.3  --   --   ALBUMIN 3.1* 3.5  --   --   AST 40 36  --   --   ALT 28 31  --   --   ALKPHOS 50 59  --   --   BILITOT 1.0 1.4*  --   --   GFRNONAA >60 >60 >60 >60  GFRAA >60 >60 >60 >60  ANIONGAP 8 10 9 6      Hematology Recent Labs Lab 05/22/17 1829 05/23/17 0514 05/24/17 0635  WBC 4.2 4.6 4.9  RBC 3.91 3.61* 3.34*  HGB 11.5* 10.4* 9.6*  HCT 34.9* 32.1* 29.8*  MCV 89.3 88.9 89.2  MCH 29.4 28.8 28.7  MCHC 33.0 32.4 32.2  RDW 14.0 13.9 14.0  PLT 297 290 265    Cardiac Enzymes Recent Labs Lab 05/23/17 0001 05/23/17 0514 05/23/17 1002  TROPONINI 0.27* 0.25* 0.20*    Recent Labs Lab 05/22/17 1843 05/22/17 2134  TROPIPOC 0.15* 0.12*     BNP Recent Labs Lab 05/22/17 1829  BNP 900.5*     DDimer  Recent Labs Lab 05/22/17 1829  DDIMER 1.42*     Radiology    Dg Chest 2 View  Result Date: 05/22/2017 CLINICAL DATA:  Shortness of breath and chest pain EXAM: CHEST  2 VIEW COMPARISON:  Chest radiograph 03/10/2016 FINDINGS: The heart size and mediastinal contours are within normal limits. Both lungs are clear. The visualized skeletal structures are unremarkable. IMPRESSION: No active cardiopulmonary disease. Electronically Signed   By: Deatra RobinsonKevin  Herman M.D.   On: 05/22/2017 20:01   Ct Angio Chest Pe W And/or Wo Contrast  Result Date: 05/22/2017 CLINICAL DATA:  Chest pain, shortness of breath. EXAM: CT ANGIOGRAPHY CHEST WITH CONTRAST TECHNIQUE: Multidetector CT imaging of the chest was performed using the standard protocol during bolus  administration of intravenous contrast. Multiplanar CT image reconstructions and MIPs were obtained to evaluate the vascular anatomy. CONTRAST:  100 mL of Isovue 370 intravenously. COMPARISON:  CT scan of April 06, 2005.  Radiographs of same day. FINDINGS: Cardiovascular: Satisfactory opacification of the pulmonary arteries to the segmental level. No evidence of pulmonary embolism. Normal heart size. No pericardial effusion. Atherosclerosis of thoracic aorta is noted without aneurysm or dissection. Mediastinum/Nodes: No enlarged mediastinal, hilar, or axillary lymph nodes. Thyroid gland, trachea, and esophagus demonstrate no significant findings. Lungs/Pleura: Lungs are clear. No pleural effusion or pneumothorax. Upper Abdomen: No acute abnormality. Musculoskeletal: No chest wall abnormality. No acute or significant osseous findings. Review of the MIP images confirms the above findings. IMPRESSION: No definite evidence of pulmonary embolus. No acute cardiopulmonary abnormality is seen. Aortic Atherosclerosis (ICD10-I70.0). Electronically Signed   By: Lupita RaiderJames  Green Jr, M.D.   On: 05/22/2017 21:22   [place] Thyroid  Result Date: 05/23/2017 CLINICAL DATA:  Subacute thyroiditis EXAM: THYROID ULTRASOUND TECHNIQUE: Ultrasound examination of the thyroid gland and adjacent soft tissues was performed. COMPARISON:  None. FINDINGS: Parenchymal Echotexture: Mildly heterogenous, hyperemic Isthmus: 0.2 cm thickness Right lobe: 4.6 x 2.3 x 1.8 cm Left lobe: 4.4 x 2 x 1.4 cm _________________________________________________________ Estimated total number of nodules >/= 1 cm: 0 Number of spongiform nodules >/=  2 cm not described below (TR1): 0 Number of mixed cystic and solid nodules >/= 1.5 cm not described below (TR2): 0 _________________________________________________________ No discrete nodules are seen within the thyroid gland. IMPRESSION: 1. Heterogeneous hyperemic thyroid, normal in size without nodule. The above is in keeping  with the ACR TI-RADS recommendations - J Am Coll Radiol 2017;14:587-595. Electronically Signed   By: Corlis Leak  Hassell M.D.   On: 05/23/2017 10:58    Cardiac Studies   TTE: 05/23/17 - Mild LVH with LVEF approximately 55%. Mild hypokinesis of the   basal inferior wall. Grade 2 diastolic dysfunction. Severe left   atrial enlargement. Mildly thickened mitral leaflets with mild to   moderate mitral regurgitation. Mild tricuspid regurgitation  with   PASP estimated 46 mmHg.  Patient Profile     71 y.o. female with a hx of HTN, HLD, subacute thyroiditis, recent GI bleed and CAD with coronary spasm who is being seen today for the evaluation of chest pain and elevated trop at the request of Dr. Lorretta HarpXilin Niu.  Assessment & Plan    1. Chest discomfort with elevated troponin: Possibly demand ischemia in the settings of UTI, treated by iv Ceftriaxone. Troponin now downtrending, max 0.27. Echo showed LVEF 55%, possible hypokinesis in 1 segment.  I would plan for a Lexiscan nuclear stress test tomorrow, unless large ischemia, no cath planned.  With recent possible GI bleed, she will be a poor candidate for PCI.   2. Subacute thyroiditis with hyperthyroidism: Free T3 12.8 five days ago, T4 also elevated with TSH low. Likely also contributed to her losing 20 pounds recently.  3. Possible UTI: Receiving IV Rocephin  4. History of nonobstructive CAD with coronary spasm: Noted to have diffuse moderate disease in 2003.  5. Hypertension  6. Hyperlipidemia  7. Hypokalemia: Replete  8. Possible GI bleed: FOBT negative, however per patient she has been having blood in the stool for the past 2 months.  Signed, Tobias AlexanderKatarina Analynn Daum, MD  05/24/2017, 11:55 AM

## 2017-05-24 NOTE — Progress Notes (Signed)
Occupational Therapy Treatment Patient Details Name: Katherine ShamsCary Staley MRN: [id number] DOB: 03/05/1946 Today's Date: 05/24/2017    History of present illness 71 y.o. female with medical history significant of hypertension, hyperlipidemia, GERD, diverticulitis, CAD, stent placement, back pain, PVC, GI bleeding, who presents with generalized weakness, SOB, chest discomfort, neck pain, sore throat, dysuria, suprapubic abdominal pain. NSTEMI; possible cystitis   OT comments  Pt progressing towards OT goals. Provided education and handout on energy conservation, AE, and fall prevention. Pt verbalized understanding of education provided. Demonstrated understanding of AE for LB ADLs and donned socks with AE and Min Guard A. Will continue to follow acutely to facilitate safe dc. Continue to recommend dc with HHOT to increase safety and independence with ADLs and functional mobility.    Follow Up Recommendations  Home health OT;Supervision/Assistance - 24 hour (initially)    Equipment Recommendations  3 in 1 bedside commode;Other (comment) (RW)    Recommendations for Other Services      Precautions / Restrictions Precautions Precautions: Fall       Mobility Bed Mobility Overal bed mobility: Modified Independent                Transfers Overall transfer level: Needs assistance                    Balance                                           ADL either performed or assessed with clinical judgement   ADL Overall ADL's : Needs assistance/impaired               Lower Body Bathing Details (indicate cue type and reason): Discussed AE for LB bathing to increase energy conservation     Lower Body Dressing: Min guard;Sit to/from stand;With adaptive equipment Lower Body Dressing Details (indicate cue type and reason): Educated pt on AE for LB dressing. Pt performed LB dressing with AE donning new socks.    Toilet Transfer Details (indicate cue type and  reason): Discussed how pt may use 3N1 for toielt transfers due to decreased activity tolerance       Tub/Shower Transfer Details (indicate cue type and reason): Educated pt on how she may use 3N1 for tub transfers and to sit for bathing   General ADL Comments: Provided pt with education and handout on energy conservation, AE, and fall prevention. Pt verbalized good understanding and discussed ways she is already implimenting EC techniques. Pt stated how she can impliment more strategies at home     Vision Baseline Vision/History: Wears glasses     Perception     Praxis      Cognition Arousal/Alertness: Awake/alert Behavior During Therapy: WFL for tasks assessed/performed Overall Cognitive Status: Within Functional Limits for tasks assessed                                          Exercises     Shoulder Instructions       General Comments Grandaughter present during session    Pertinent Vitals/ Pain       Pain Assessment: Faces Pain Score: 3  Faces Pain Scale: No hurt Pain Intervention(s): Monitored during session  Home Living Family/patient expects to be discharged to:: Private residence Living Arrangements: Alone  Available Help at Discharge: Family;Friend(s);Available 24 hours/day Type of Home: House       Home Layout: One level     Bathroom Shower/Tub: Chief Strategy OfficerTub/shower unit   Bathroom Toilet: Standard     Home Equipment: None          Prior Functioning/Environment Level of Independence: Independent        Comments: Had increased difficulty recently doing IADL tasks and friends assisted   Frequency  Min 2X/week        Progress Toward Goals  OT Goals(current goals can now be found in the care plan section)  Progress towards OT goals: Progressing toward goals  Acute Rehab OT Goals Patient Stated Goal: to be independent OT Goal Formulation: With patient Time For Goal Achievement: 06/06/17 Potential to Achieve Goals: Good ADL  Goals Pt Will Perform Lower Body Bathing: with modified independence;sit to/from stand Pt Will Perform Lower Body Dressing: with modified independence;sit to/from stand Pt Will Transfer to Toilet: with modified independence;ambulating Additional ADL Goal #1: Pt will verbalize 3 energy conservation strategies independently Additional ADL Goal #2: Pt will independently verbalize 3 strategies to reduce risk of falls  Plan Discharge plan remains appropriate    Co-evaluation                 AM-PAC PT "6 Clicks" Daily Activity     Outcome Measure   Help from another person eating meals?: None Help from another person taking care of personal grooming?: None Help from another person toileting, which includes using toliet, bedpan, or urinal?: A Little Help from another person bathing (including washing, rinsing, drying)?: A Little Help from another person to put on and taking off regular upper body clothing?: None Help from another person to put on and taking off regular lower body clothing?: A Little 6 Click Score: 21    End of Session    OT Visit Diagnosis: Unsteadiness on feet (R26.81);Muscle weakness (generalized) (M62.81);Pain Pain - part of body:  (abdomen)   Activity Tolerance Patient tolerated treatment well   Patient Left in bed;with call bell/phone within reach;with family/visitor present   Nurse Communication Mobility status        Time: [id number] OT Time Calculation (min): 11 min  Charges: OT General Charges $OT Visit: 1 Procedure OT Treatments $Self Care/Home Management : 8-22 mins  Henny Strauch MSOT, OTR/L Acute Rehab Pager: [phone] Office: [phone]    Theodoro GristCharis M Osman [place]illa 05/24/2017, 4:56 PM

## 2017-05-24 NOTE — Progress Notes (Signed)
Initial Nutrition Assessment  DOCUMENTATION CODES:   Severe malnutrition in context of chronic illness  INTERVENTION:   Ensure Enlive po BID, each supplement provides 350 kcal and 20 grams of protein  NUTRITION DIAGNOSIS:   Malnutrition (Severe) related to chronic illness as evidenced by severe depletion of muscle mass, percent weight loss, mild depletion of body fat.  GOAL:   Patient will meet greater than or equal to 90% of their needs  MONITOR:   PO intake, Supplement acceptance, Labs, Weight trends  REASON FOR ASSESSMENT:   Consult Assessment of nutrition requirement/status  ASSESSMENT:   71 yo female admitted with sub-acute thyroiditis. Pt with hx of HTN, HL, GERD, diverticulitis, CAD  Recorded po 81% on average. Pt reports appetite improving since admission. Pt reports poor appetite for severeal months (eating crackers and gingeral, maybe a sandwich but no "full meals" with associated wt loss of 20 pounds (>/= 13.7% wt loss in 3 months) which is significant for time frame. Per wt encounters, pt with 15.5% wt loss in 6 months which is also significant for time frame. Pt reports she drinks Ensure whenever she has it but not daily  Pt indicates some problems swallowing, food gets "stuck in throat.," but pt able to get it down by drinking something  Nutrition-Focused physical exam completed. Findings are mild fat depletion, mild/moderate to severe muscle depletion, and no edema.   Labs: reviewed Meds: Vitamin B-12, C, E; KCL, prednisone  Diet Order:  Diet Heart Room service appropriate? Yes; Fluid consistency: Thin  Skin:  Reviewed, no issues  Last BM:  7/26  Height:   Ht Readings from Last 1 Encounters:  05/23/17 5\' 6"  (1.676 m)    Weight:   Wt Readings from Last 1 Encounters:  05/24/17 125 lb 3.2 oz (56.8 kg)    BMI:  Body mass index is 20.21 kg/m.  Estimated Nutritional Needs:   Kcal:  1440-1660 kcals  Protein:  72-83 g  Fluid:  >/= 1.5  L  EDUCATION NEEDS:   No education needs identified at this time  Romelle StarcherCate Aryahi Denzler MS, RD, LDN [phone] Pager  [phone] Weekend/On-Call Pager

## 2017-05-24 NOTE — Progress Notes (Signed)
PROGRESS NOTE    Katherine ShamsCary Medico  EAV:[id number] DOB: 07/31/1946 DOA: 05/22/2017 PCP: [name], Stephen O, MD   Brief Narrative: 71 y.o. female with medical history significant of hypertension, hyperlipidemia, GERD, diverticulitis, CAD, stent placement, back pain, PVC, GI bleeding, who presents with generalized weakness, SOB, chest discomfort, neck pain, sore throat, dysuria, suprapubic abdominal pain.  ED Course: pt was found to have troponin 0.12-->0.27, negative FOBT, lactic acid 2.018, 122, INR 1.3, BNP 100.5, lipase 33, potassium 3.4, creatinine normal, urinalysis with small amount of leukocyte and many bacteria's, temperature normal, tachycardia, tachypnea, oxygen saturation 100% on room air, chest x-rays negative. CT angiogram of chest is negative for PE.  Assessment & Plan:   # NSTEMI: Troponin abnormal,0.27>0.25>0.20 -Patient presented with chest pain or shortness of breath and found to have serum troponin level of 0.27. Started on IV heparin on admission. -Patient denied chest pain this morning. Evaluated by cardiologist. I discussed with Dr. Diona BrownerMcDowell. He recommended stopping IV heparin and obtain echocardiogram. Echocardiogram shows Mild hypokinesis of the   basal inferior wall -Continue aspirin, Lipitor, beta blocker, Imdur, lisinopril Venous Doppler negative for DVT CT chest negative for PE Pending decision from cardiology about ischemic workup?   .  #Possible subacute thyroiditis: Patient has thyroid gland tenderness. -Patient with severely suppressed TSH associated with elevated T3 and T4 level on July 24. Patient is on atenolol. Ultrasound of neck consistent with heterogeneous hyperemic thyroid without nodules. Patient will need to follow up with endocrinologist outpatient. May benefit from methimazole, patient started on prednisone 40 mg a day to see if the patient will improve. Patient will need outpatient endocrinology evaluation  # Possible acute cystitis without hematuria:  Discontinue  IV ceftriaxone. Change to Omnicef 3 more days  #GERD: Continue Protonix  #Hypertension: Continue to monitor blood pressure. Continue current cardiac medications.  #Hyperlipidemia: Continue Lipitor and Zetia  #Severe hypokalemia and hypomagnesemia: Repleted IV and oral potassium chloride  #Recent history of GI bleed: as per H&P Fecal occult blood test negative. Continue Protonix.  #Moderate protein calorie malnutrition: Continue nutrition consult  #Generalized weakness likely contributed by thyroid disease and comorbidities: Continue PT OT evaluation.   DVT prophylaxis: Lovenox subcutaneous Code Status: Full code Family Communication: No family at bedside Disposition Plan: Likely discharge home in 1-2 days. Pending further decisions about workup from cardiology   Consultants:   Cardiology  Procedures: Pending echo Antimicrobials: IV ceftriaxone  Subjective:   denied chest pain. Shortness of breath is better. Very jittery and anxious   Objective: Vitals:   05/23/17 0925 05/23/17 1216 05/23/17 2029 05/24/17 0559  BP: (!) 130/55 (!) 104/42 118/78 (!) 151/87  Pulse:  80 77 80  Resp:  20 20 20   Temp:  98.6 F (37 C) 98.2 F (36.8 C) 98.4 F (36.9 C)  TempSrc:  Oral Oral Oral  SpO2:  98% 100% 99%  Weight:    56.8 kg (125 lb 3.2 oz)  Height:        Intake/Output Summary (Last 24 hours) at 05/24/17 0856 Last data filed at 05/24/17 0800  Gross per 24 hour  Intake             2155 ml  Output             1650 ml  Net              505 ml   Filed Weights   05/22/17 1503 05/23/17 0030 05/24/17 0559  Weight: 61.2 kg (135 lb) 56.5 kg (124 lb  9 oz) 56.8 kg (125 lb 3.2 oz)    Examination:  General exam: Appears calm and comfortable  Respiratory system: Clear to auscultation. Respiratory effort normal. No wheezing or crackle Cardiovascular system: S1 & S2 heard, RRR.  No pedal edema. Gastrointestinal system: Abdomen is nondistended, soft and nontender.  Normal bowel sounds heard. Central nervous system: Alert and oriented. No focal neurological deficits. Extremities: Symmetric 5 x 5 power. Skin: No rashes, lesions or ulcers Psychiatry: Judgement and insight appear normal. Mood & affect appropriate.     Data Reviewed: I have personally reviewed following labs and imaging studies  CBC:  Recent Labs Lab 05/18/17 0856 05/21/17 1122 05/22/17 1829 05/23/17 0514 05/24/17 0635  WBC 4.2 3.6* 4.2 4.6 4.9  NEUTROABS  --   --  2.2  --   --   HGB 10.0* 9.9* 11.5* 10.4* 9.6*  HCT 30.5* 30.3* 34.9* 32.1* 29.8*  MCV 93.2 89.6 89.3 88.9 89.2  PLT 234.0 276 297 290 265   Basic Metabolic Panel:  Recent Labs Lab 05/21/17 1122 05/22/17 1829 05/23/17 0514 05/23/17 1002 05/24/17 0635  NA 140 141 140  --  140  K 3.2* 3.4* 3.0*  --  3.5  CL 106 104 104  --  108  CO2 26 27 27   --  26  GLUCOSE 117* 108* 99  --  93  BUN 13 10 8   --  15  CREATININE 0.83 0.67 0.57  --  0.68  CALCIUM 10.1 10.6* 9.7  --  10.0  MG  --  1.5*  --  1.8 1.9   GFR: Estimated Creatinine Clearance: 58.7 mL/min (by C-G formula based on SCr of 0.68 mg/dL). Liver Function Tests:  Recent Labs Lab 05/21/17 1122 05/22/17 1829  AST 40 36  ALT 28 31  ALKPHOS 50 59  BILITOT 1.0 1.4*  PROT 6.3* 7.3  ALBUMIN 3.1* 3.5    Recent Labs Lab 05/22/17 1829  LIPASE 33   No results for input(s): AMMONIA in the last 168 hours. Coagulation Profile:  Recent Labs Lab 05/22/17 1829  INR 1.30   Cardiac Enzymes:  Recent Labs Lab 05/23/17 0001 05/23/17 0514 05/23/17 1002  TROPONINI 0.27* 0.25* 0.20*   BNP (last 3 results)  Recent Labs  05/10/17 1324  PROBNP 289.0*   HbA1C: No results for input(s): HGBA1C in the last 72 hours. CBG:  Recent Labs Lab 05/24/17 0558  GLUCAP 110*   Lipid Profile:  Recent Labs  05/23/17 0514  CHOL 75  HDL 25*  LDLCALC 38  TRIG 58  CHOLHDL 3.0   Thyroid Function Tests:  Recent Labs  05/22/17 1829  TSH <0.010*     Anemia Panel: No results for input(s): VITAMINB12, FOLATE, FERRITIN, TIBC, IRON, RETICCTPCT in the last 72 hours. Sepsis Labs:  Recent Labs Lab 05/22/17 1845 05/22/17 2135  LATICACIDVEN 2.18* 1.20    Recent Results (from the past 240 hour(s))  Urine culture     Status: Abnormal   Collection Time: 05/22/17  6:29 PM  Result Value Ref Range Status   Specimen Description URINE, RANDOM  Final   Special Requests NONE  Final   Culture >=100,000 COLONIES/mL ESCHERICHIA COLI (A)  Final   Report Status 05/24/2017 FINAL  Final   Organism ID, Bacteria ESCHERICHIA COLI (A)  Final      Susceptibility   Escherichia coli - MIC*    AMPICILLIN <=2 SENSITIVE Sensitive     CEFAZOLIN <=4 SENSITIVE Sensitive     CEFTRIAXONE <=1 SENSITIVE Sensitive  CIPROFLOXACIN <=0.25 SENSITIVE Sensitive     GENTAMICIN <=1 SENSITIVE Sensitive     IMIPENEM <=0.25 SENSITIVE Sensitive     NITROFURANTOIN <=16 SENSITIVE Sensitive     TRIMETH/SULFA <=20 SENSITIVE Sensitive     AMPICILLIN/SULBACTAM <=2 SENSITIVE Sensitive     PIP/TAZO <=4 SENSITIVE Sensitive     Extended ESBL NEGATIVE Sensitive     * >=100,000 COLONIES/mL ESCHERICHIA COLI  Culture, blood (Routine X 2) w Reflex to ID Panel     Status: None (Preliminary result)   Collection Time: 05/22/17  6:39 PM  Result Value Ref Range Status   Specimen Description BLOOD RIGHT ANTECUBITAL  Final   Special Requests IN PEDIATRIC BOTTLE Blood Culture adequate volume  Final   Culture NO GROWTH < 24 HOURS  Final   Report Status PENDING  Incomplete  Culture, blood (Routine X 2) w Reflex to ID Panel     Status: None (Preliminary result)   Collection Time: 05/23/17 12:05 AM  Result Value Ref Range Status   Specimen Description BLOOD RIGHT HAND  Final   Special Requests   Final    BOTTLES DRAWN AEROBIC ONLY Blood Culture adequate volume   Culture NO GROWTH < 24 HOURS  Final   Report Status PENDING  Incomplete  Rapid strep screen (not at [facility])     Status: None    Collection Time: 05/23/17  2:30 AM  Result Value Ref Range Status   Streptococcus, Group A Screen (Direct) NEGATIVE NEGATIVE Final    Comment: (NOTE) A Rapid Antigen test may result negative if the antigen level in the sample is below the detection level of this test. The FDA has not cleared this test as a stand-alone test therefore the rapid antigen negative result has reflexed to a Group A Strep culture.          Radiology Studies: Dg Chest 2 View  Result Date: 05/22/2017 CLINICAL DATA:  Shortness of breath and chest pain EXAM: CHEST  2 VIEW COMPARISON:  Chest radiograph 03/10/2016 FINDINGS: The heart size and mediastinal contours are within normal limits. Both lungs are clear. The visualized skeletal structures are unremarkable. IMPRESSION: No active cardiopulmonary disease. Electronically Signed   By: Deatra Robinson M.D.   On: 05/22/2017 20:01   Ct Angio Chest Pe W And/or Wo Contrast  Result Date: 05/22/2017 CLINICAL DATA:  Chest pain, shortness of breath. EXAM: CT ANGIOGRAPHY CHEST WITH CONTRAST TECHNIQUE: Multidetector CT imaging of the chest was performed using the standard protocol during bolus administration of intravenous contrast. Multiplanar CT image reconstructions and MIPs were obtained to evaluate the vascular anatomy. CONTRAST:  100 mL of Isovue 370 intravenously. COMPARISON:  CT scan of April 06, 2005.  Radiographs of same day. FINDINGS: Cardiovascular: Satisfactory opacification of the pulmonary arteries to the segmental level. No evidence of pulmonary embolism. Normal heart size. No pericardial effusion. Atherosclerosis of thoracic aorta is noted without aneurysm or dissection. Mediastinum/Nodes: No enlarged mediastinal, hilar, or axillary lymph nodes. Thyroid gland, trachea, and esophagus demonstrate no significant findings. Lungs/Pleura: Lungs are clear. No pleural effusion or pneumothorax. Upper Abdomen: No acute abnormality. Musculoskeletal: No chest wall abnormality. No  acute or significant osseous findings. Review of the MIP images confirms the above findings. IMPRESSION: No definite evidence of pulmonary embolus. No acute cardiopulmonary abnormality is seen. Aortic Atherosclerosis (ICD10-I70.0). Electronically Signed   By: Lupita Raider, M.D.   On: 05/22/2017 21:22   US Thyroid  Result Date: 05/23/2017 CLINICAL DATA:  Subacute thyroiditis EXAM: THYROID ULTRASOUND TECHNIQUE:  Ultrasound examination of the thyroid gland and adjacent soft tissues was performed. COMPARISON:  None. FINDINGS: Parenchymal Echotexture: Mildly heterogenous, hyperemic Isthmus: 0.2 cm thickness Right lobe: 4.6 x 2.3 x 1.8 cm Left lobe: 4.4 x 2 x 1.4 cm _________________________________________________________ Estimated total number of nodules >/= 1 cm: 0 Number of spongiform nodules >/=  2 cm not described below (TR1): 0 Number of mixed cystic and solid nodules >/= 1.5 cm not described below (TR2): 0 _________________________________________________________ No discrete nodules are seen within the thyroid gland. IMPRESSION: 1. Heterogeneous hyperemic thyroid, normal in size without nodule. The above is in keeping with the ACR TI-RADS recommendations - J Am Coll Radiol 2017;14:587-595. Electronically Signed   By: Corlis Leak M.D.   On: 05/23/2017 10:58        Scheduled Meds: . amLODipine  10 mg Oral Daily  . aspirin  81 mg Oral Once  . aspirin  325 mg Oral Daily  . atenolol  50 mg Oral BID  . atorvastatin  20 mg Oral Daily  . enoxaparin (LOVENOX) injection  40 mg Subcutaneous Q24H  . ezetimibe  10 mg Oral Daily  . ferrous sulfate  325 mg Oral Daily  . hydrocortisone  25 mg Rectal BID  . isosorbide mononitrate  120 mg Oral Daily  . lisinopril  20 mg Oral Daily  . oxybutynin  10 mg Oral Daily  . pantoprazole  40 mg Oral BID  . predniSONE  40 mg Oral Q breakfast  . venlafaxine XR  150 mg Oral QHS  . vitamin B-12  100 mcg Oral QODAY  . vitamin C  500 mg Oral Daily  . vitamin E  1,000  Units Oral QODAY   Continuous Infusions: . cefTRIAXone (ROCEPHIN)  IV Stopped (05/23/17 2230)     LOS: 1 day    Richarda Overlie, MD [facility] Pager [phone]  If 7PM-7AM, please contact night-coverage [email or web address] Password [email or web address] 05/24/2017, 8:56 AM

## 2017-05-25 ENCOUNTER — Inpatient Hospital Stay (HOSPITAL_COMMUNITY): Payer: Medicare HMO

## 2017-05-25 DIAGNOSIS — E7800 Pure hypercholesterolemia, unspecified: Secondary | ICD-10-CM

## 2017-05-25 DIAGNOSIS — I214 Non-ST elevation (NSTEMI) myocardial infarction: Secondary | ICD-10-CM

## 2017-05-25 DIAGNOSIS — R195 Other fecal abnormalities: Secondary | ICD-10-CM

## 2017-05-25 DIAGNOSIS — R9439 Abnormal result of other cardiovascular function study: Secondary | ICD-10-CM

## 2017-05-25 DIAGNOSIS — R0602 Shortness of breath: Secondary | ICD-10-CM

## 2017-05-25 LAB — GLUCOSE, CAPILLARY: Glucose-Capillary: 105 mg/dL — ABNORMAL HIGH (ref 65–99)

## 2017-05-25 LAB — CULTURE, GROUP A STREP (THRC)

## 2017-05-25 LAB — NM MYOCAR MULTI W/SPECT W/WALL MOTION / EF
Exercise duration (min): 0 min
Peak HR: 91 {beats}/min
Rest HR: 73 {beats}/min

## 2017-05-25 MED ORDER — SODIUM CHLORIDE 0.9 % WEIGHT BASED INFUSION
1.0000 mL/kg/h | INTRAVENOUS | Status: DC
  Administered 2017-05-26: 1 mL/kg/h via INTRAVENOUS

## 2017-05-25 MED ORDER — POTASSIUM CHLORIDE CRYS ER 20 MEQ PO TBCR
40.0000 meq | EXTENDED_RELEASE_TABLET | Freq: Once | ORAL | Status: AC
Start: 2017-05-25 — End: 2017-05-25
  Administered 2017-05-25: 40 meq via ORAL
  Filled 2017-05-25: qty 2

## 2017-05-25 MED ORDER — ASPIRIN 81 MG PO CHEW
81.0000 mg | CHEWABLE_TABLET | ORAL | Status: AC
  Administered 2017-05-26: 81 mg via ORAL
  Filled 2017-05-25: qty 1

## 2017-05-25 MED ORDER — REGADENOSON 0.4 MG/5ML IV SOLN
INTRAVENOUS | Status: AC
  Filled 2017-05-25: qty 5

## 2017-05-25 MED ORDER — CEFDINIR 125 MG/5ML PO SUSR: 250.0000 mg | Freq: Two times a day (BID) | ORAL | 0 refills | Status: AC

## 2017-05-25 MED ORDER — SODIUM CHLORIDE 0.9 % WEIGHT BASED INFUSION
3.0000 mL/kg/h | INTRAVENOUS | Status: DC
  Administered 2017-05-26: 3 mL/kg/h via INTRAVENOUS

## 2017-05-25 MED ORDER — TECHNETIUM TC 99M TETROFOSMIN IV KIT
10.0000 | PACK | Freq: Once | INTRAVENOUS | Status: AC | PRN
  Administered 2017-05-25: 10 via INTRAVENOUS

## 2017-05-25 MED ORDER — REGADENOSON 0.4 MG/5ML IV SOLN
0.4000 mg | Freq: Once | INTRAVENOUS | Status: AC
  Administered 2017-05-25: 0.4 mg via INTRAVENOUS
  Filled 2017-05-25: qty 5

## 2017-05-25 MED ORDER — TECHNETIUM TC 99M TETROFOSMIN IV KIT
30.0000 | PACK | Freq: Once | INTRAVENOUS | Status: AC | PRN
  Administered 2017-05-25: 30 via INTRAVENOUS

## 2017-05-25 MED ORDER — ENSURE ENLIVE PO LIQD: 237.0000 mL | Freq: Two times a day (BID) | ORAL | 12 refills | Status: DC

## 2017-05-25 MED ORDER — SODIUM CHLORIDE 0.9% FLUSH
3.0000 mL | Freq: Two times a day (BID) | INTRAVENOUS | Status: DC
  Administered 2017-05-25 – 2017-05-26 (×2): 3 mL via INTRAVENOUS

## 2017-05-25 MED ORDER — ATENOLOL 50 MG PO TABS: 50.0000 mg | ORAL_TABLET | Freq: Two times a day (BID) | ORAL | 2 refills | Status: DC

## 2017-05-25 MED ORDER — SODIUM CHLORIDE 0.9 % IV SOLN: 250.0000 mL | INTRAVENOUS | Status: DC | PRN

## 2017-05-25 MED ORDER — SODIUM CHLORIDE 0.9% FLUSH: 3.0000 mL | INTRAVENOUS | Status: DC | PRN

## 2017-05-25 NOTE — Progress Notes (Signed)
PROGRESS NOTE    [name]  ZOX:[id number] DOB: 09/10/1946 DOA: 05/22/2017 PCP: [name], Stephen O, MD   [facility] McClainis a 71 y.o.femalewith medical history significant of hypertension, hyperlipidemia, GERD, diverticulitis, CAD, stent placement, back pain, PVC, GI bleeding, who presents with generalized weakness, SOB, chest discomfort, neck pain, sore throat, dysuria, suprapubic abdominal pain.  Patient states that she has not been feeling well in the past 3 mouth. She has multiple complaints, including generalized weakness, body aches, shortness of breath, chest discomfort, neck pain, sore throat, dysuria, suprapubic abdominal pain.  Patient states that she has shortness of breath intermittently, and mild chest discomfort sometimes. She has dry cough, no fever or chills. She has body aches, generalized weakness. Recently she has front neck pain, sore throat, but no runny nose. She has nausea, no vomiting or diarrhea. She has suprapubic abdominal pain, which is 2 out of 10 in severity, intermittent, nonradiating. She has intermittent dysuria, burning on urination and increased urinary frequency. She also has bilateral shoulder pain, but no swelling or redness in shoulders. No unilateral weakness. No hematochezia, hematemesis or hematuria. She states that she lost 20 pounds in the past 3 mouth. She reports that she was seen yesterday and was told that she has thyroid problems and possibly a GI bleed. She was told to follow-up with 2 different specialist, GI and endocrine doctor. Her TSH was<0.01 yesterday. Her free T3 was 12.8 and T4 3.64 on 05/18/17. Patient reports dark stool twice.   ED Course:pt was found to have troponin 0.12-->0.27, negative FOBT, lactic acid 2.018, 122, INR 1.3, BNP 100.5, lipase 33, potassium 3.4, creatinine normal, urinalysis with small amount of leukocyte and many bacteria's, temperature normal, tachycardia, tachypnea, oxygen saturation 100% on room air, chest x-rays  negative. CT angiogram of chest is negative for PE. Patient is placed on telemetry bed for observation.  HOSPITAL COURSE:   # NSTEMI:Troponin abnormal,0.27>0.25>0.20 -Patient presented with chest pain or shortness of breath and found to have serum troponin level of 0.27. Started on IV heparin on admission. -Patient denied chest pain  . Evaluated by cardiologist. Dr. Diona BrownerMcDowell. He recommended stoppingIV heparin and obtaining echocardiogram.Echocardiogram shows Mild hypokinesis of the basal inferior wall -Continue aspirin, Lipitor, beta blocker, Imdur, lisinopril Venous Doppler negative for DVT CT chest negative for PE As per Dr. Delton SeeNelson, patient underwent Lexiscan nuclear stress   Abnormal stress test - personally reviewed, small ischemia in the apical anterior and moderate ischemia in the basal and mid inferior walls.   plan for a LHC tomorrow. Marland Kitchen.  #Possible subacute thyroiditis:Patient has thyroid gland tenderness. -Patient with severely suppressed TSH associated with elevated T3 and T4 level on July 24. Patient is on atenolol. Ultrasound of neck consistent with heterogeneous hyperemic thyroid without nodules. Patient will need to follow up with endocrinologist outpatient on an urgent basis. May benefit from methimazole,   # Possible acute cystitis without hematuria: Discontinued IV ceftriaxone. Change to Omnicef 3 more days  #GERD: Continue Protonix  #Hypertension: Continue to monitor blood pressure. Continue current cardiac medications.  #Hyperlipidemia: Continue Lipitor and Zetia  #Severe hypokalemia and hypomagnesemia: Repleted IV and oral potassium chloride  #Recent history of GI bleed: as per H&P Fecal occult blood test negative. Continue Protonix.  #Moderate protein calorie malnutrition: Continue nutrition consult  #Generalized weakness likely contributed by thyroid disease and comorbidities: Patient is being discharged with home health    DVT prophylaxis:  Lovenox subcutaneous Code Status: Full code Family Communication: No family at bedside Disposition Plan: Likely discharge  home in 1-2 days. Pending further decisions about workup from cardiology   Consultants:   Cardiology  Procedures: Pending echo Antimicrobials: IV ceftriaxone  Subjective:   denied chest pain. Shortness of breath is better. Very jittery and anxious   Objective: Vitals:   05/25/17 1001 05/25/17 1024 05/25/17 1026 05/25/17 1028  BP: (!) 157/61 140/62 132/66 (!) 144/66  Pulse:      Resp:      Temp:      TempSrc:      SpO2:      Weight:      Height:        Intake/Output Summary (Last 24 hours) at 05/25/17 1515 Last data filed at 05/24/17 2209  Gross per 24 hour  Intake              240 ml  Output              620 ml  Net             -380 ml   Filed Weights   05/23/17 0030 05/24/17 0559 05/25/17 0521  Weight: 56.5 kg (124 lb 9 oz) 56.8 kg (125 lb 3.2 oz) 57.4 kg (126 lb 8 oz)    Examination:  General exam: Appears calm and comfortable  Respiratory system: Clear to auscultation. Respiratory effort normal. No wheezing or crackle Cardiovascular system: S1 & S2 heard, RRR.  No pedal edema. Gastrointestinal system: Abdomen is nondistended, soft and nontender. Normal bowel sounds heard. Central nervous system: Alert and oriented. No focal neurological deficits. Extremities: Symmetric 5 x 5 power. Skin: No rashes, lesions or ulcers Psychiatry: Judgement and insight appear normal. Mood & affect appropriate.     Data Reviewed: I have personally reviewed following labs and imaging studies  CBC:  Recent Labs Lab 05/21/17 1122 05/22/17 1829 05/23/17 0514 05/24/17 0635  WBC 3.6* 4.2 4.6 4.9  NEUTROABS  --  2.2  --   --   HGB 9.9* 11.5* 10.4* 9.6*  HCT 30.3* 34.9* 32.1* 29.8*  MCV 89.6 89.3 88.9 89.2  PLT 276 297 290 265   Basic Metabolic Panel:  Recent Labs Lab 05/21/17 1122 05/22/17 1829 05/23/17 0514 05/23/17 1002 05/24/17 0635  NA  140 141 140  --  140  K 3.2* 3.4* 3.0*  --  3.5  CL 106 104 104  --  108  CO2 26 27 27   --  26  GLUCOSE 117* 108* 99  --  93  BUN 13 10 8   --  15  CREATININE 0.83 0.67 0.57  --  0.68  CALCIUM 10.1 10.6* 9.7  --  10.0  MG  --  1.5*  --  1.8 1.9   GFR: Estimated Creatinine Clearance: 59.3 mL/min (by C-G formula based on SCr of 0.68 mg/dL). Liver Function Tests:  Recent Labs Lab 05/21/17 1122 05/22/17 1829  AST 40 36  ALT 28 31  ALKPHOS 50 59  BILITOT 1.0 1.4*  PROT 6.3* 7.3  ALBUMIN 3.1* 3.5    Recent Labs Lab 05/22/17 1829  LIPASE 33   No results for input(s): AMMONIA in the last 168 hours. Coagulation Profile:  Recent Labs Lab 05/22/17 1829  INR 1.30   Cardiac Enzymes:  Recent Labs Lab 05/23/17 0001 05/23/17 0514 05/23/17 1002  TROPONINI 0.27* 0.25* 0.20*   BNP (last 3 results)  Recent Labs  05/10/17 1324  PROBNP 289.0*   HbA1C:  Recent Labs  05/23/17 0514  HGBA1C 5.8*   CBG:  Recent Labs Lab  05/24/17 0558 05/25/17 0643  GLUCAP 110* 105*   Lipid Profile:  Recent Labs  05/23/17 0514  CHOL 75  HDL 25*  LDLCALC 38  TRIG 58  CHOLHDL 3.0   Thyroid Function Tests:  Recent Labs  05/22/17 1829  TSH <0.010*   Anemia Panel: No results for input(s): VITAMINB12, FOLATE, FERRITIN, TIBC, IRON, RETICCTPCT in the last 72 hours. Sepsis Labs:  Recent Labs Lab 05/22/17 1845 05/22/17 2135  LATICACIDVEN 2.18* 1.20    Recent Results (from the past 240 hour(s))  Urine culture     Status: Abnormal   Collection Time: 05/22/17  6:29 PM  Result Value Ref Range Status   Specimen Description URINE, RANDOM  Final   Special Requests NONE  Final   Culture >=100,000 COLONIES/mL ESCHERICHIA COLI (A)  Final   Report Status 05/24/2017 FINAL  Final   Organism ID, Bacteria ESCHERICHIA COLI (A)  Final      Susceptibility   Escherichia coli - MIC*    AMPICILLIN <=2 SENSITIVE Sensitive     CEFAZOLIN <=4 SENSITIVE Sensitive     CEFTRIAXONE <=1  SENSITIVE Sensitive     CIPROFLOXACIN <=0.25 SENSITIVE Sensitive     GENTAMICIN <=1 SENSITIVE Sensitive     IMIPENEM <=0.25 SENSITIVE Sensitive     NITROFURANTOIN <=16 SENSITIVE Sensitive     TRIMETH/SULFA <=20 SENSITIVE Sensitive     AMPICILLIN/SULBACTAM <=2 SENSITIVE Sensitive     PIP/TAZO <=4 SENSITIVE Sensitive     Extended ESBL NEGATIVE Sensitive     * >=100,000 COLONIES/mL ESCHERICHIA COLI  Culture, blood (Routine X 2) w Reflex to ID Panel     Status: None (Preliminary result)   Collection Time: 05/22/17  6:39 PM  Result Value Ref Range Status   Specimen Description BLOOD RIGHT ANTECUBITAL  Final   Special Requests IN PEDIATRIC BOTTLE Blood Culture adequate volume  Final   Culture NO GROWTH 2 DAYS  Final   Report Status PENDING  Incomplete  Culture, blood (Routine X 2) w Reflex to ID Panel     Status: None (Preliminary result)   Collection Time: 05/23/17 12:05 AM  Result Value Ref Range Status   Specimen Description BLOOD RIGHT HAND  Final   Special Requests   Final    BOTTLES DRAWN AEROBIC ONLY Blood Culture adequate volume   Culture NO GROWTH 2 DAYS  Final   Report Status PENDING  Incomplete  Rapid strep screen (not at [facility])     Status: None   Collection Time: 05/23/17  2:30 AM  Result Value Ref Range Status   Streptococcus, Group A Screen (Direct) NEGATIVE NEGATIVE Final    Comment: (NOTE) A Rapid Antigen test may result negative if the antigen level in the sample is below the detection level of this test. The FDA has not cleared this test as a stand-alone test therefore the rapid antigen negative result has reflexed to a Group A Strep culture.   Culture, group A strep     Status: None   Collection Time: 05/23/17  2:30 AM  Result Value Ref Range Status   Specimen Description THROAT  Final   Special Requests NONE Reflexed from [id number]  Final   Culture NO GROUP A STREP (S.PYOGENES) ISOLATED  Final   Report Status 05/25/2017 FINAL  Final         Radiology  Studies: Nm Myocar Multi W/spect W/wall Motion / Ef  Result Date: 05/25/2017  There was no ST segment deviation noted during stress.  T wave inversion was  noted during stress.  Defect 1: There is a medium defect of moderate severity present in the basal inferior and mid inferior location.  Defect 2: There is a small defect of mild severity present in the apical septal location.  This is an intermediate risk study.  Suboptimal, intermediate risk stress nuclear study with partially reversible inferior defect (infarct vs attenuation artifact) and mild inferior ischemia; mild distal anterior/apical ischemia; cannot R/O transient ischemic LV dilatation; mild LVE; study not gated.        Scheduled Meds: . amLODipine  10 mg Oral Daily  . aspirin  81 mg Oral Once  . atenolol  50 mg Oral BID  . atorvastatin  20 mg Oral Daily  . cefdinir  250 mg Oral BID  . enoxaparin (LOVENOX) injection  40 mg Subcutaneous Q24H  . ezetimibe  10 mg Oral Daily  . feeding supplement (ENSURE ENLIVE)  237 mL Oral BID BM  . ferrous sulfate  325 mg Oral Daily  . hydrocortisone  25 mg Rectal BID  . isosorbide mononitrate  120 mg Oral Daily  . lisinopril  20 mg Oral Daily  . oxybutynin  10 mg Oral Daily  . pantoprazole  40 mg Oral BID  . potassium chloride  40 mEq Oral Once  . predniSONE  40 mg Oral Q breakfast  . regadenoson      . venlafaxine XR  150 mg Oral QHS  . vitamin B-12  100 mcg Oral QODAY  . vitamin C  500 mg Oral Daily  . vitamin E  1,000 Units Oral QODAY   Continuous Infusions:    LOS: 2 days    Richarda OverlieNayana Rolondo Pierre, MD [facility] Pager [phone]  If 7PM-7AM, please contact night-coverage [email or web address] Password [email or web address] 05/25/2017, 3:15 PM

## 2017-05-25 NOTE — Progress Notes (Signed)
Physical Therapy Treatment Patient Details Name: Katherine ShamsCary Pickeral MRN: [id number] DOB: 03/11/1946 Today's Date: 05/25/2017    History of Present Illness 71 y.o. female with medical history significant of hypertension, hyperlipidemia, GERD, diverticulitis, CAD, stent placement, back pain, PVC, GI bleeding, who presents with generalized weakness, SOB, chest discomfort, neck pain, sore throat, dysuria, suprapubic abdominal pain. NSTEMI; possible cystitis    PT Comments    Pt making slow progress towards her goals today. Pt is mod I for bed mobility and supervision for transfers. Pt is minA for steadying without an AD and min guard with RW. Pt educated on need to use RW to prevent falling at home even if she feels she doesn't need it because her first couple steps are often unsteady. Pt requires skilled PT to progress gait training with RW and to improve LE strength and endurance to safely mobilize in her home environment.   Follow Up Recommendations  Home health PT     Equipment Recommendations  Rolling walker with 5" wheels    Recommendations for Other Services OT consult     Precautions / Restrictions Precautions Precautions: Fall Restrictions Weight Bearing Restrictions: No    Mobility  Bed Mobility Overal bed mobility: Modified Independent                Transfers Overall transfer level: Needs assistance Equipment used: Rolling walker (2 wheeled) Transfers: Sit to/from Stand Sit to Stand: Supervision         General transfer comment: Cues for hand placement and safety  Ambulation/Gait Ambulation/Gait assistance: Min assist;Min guard Ambulation Distance (Feet): 80 Feet Assistive device: Rolling walker (2 wheeled);None Gait Pattern/deviations: Step-through pattern;Decreased step length - right;Decreased step length - left Gait velocity: slowed Gait velocity interpretation: Below normal speed for age/gender General Gait Details: pt initially requested to start gait  without RW, pt with 2 x LoB requiring minA to steady. Pt with min guard when using RW.       Balance Overall balance assessment: Needs assistance Sitting-balance support: No upper extremity supported;Feet supported Sitting balance-Leahy Scale: Normal     Standing balance support: No upper extremity supported Standing balance-Leahy Scale: Good Standing balance comment: pt static standing is good but first couple of steps without AD are unsteady                            Cognition Arousal/Alertness: Awake/alert Behavior During Therapy: WFL for tasks assessed/performed Overall Cognitive Status: Within Functional Limits for tasks assessed                                           General Comments General comments (skin integrity, edema, etc.): Pt with 3/4 DoE after ambulation. VSS      Pertinent Vitals/Pain Pain Assessment: 0-10 Pain Score: 3  Pain Location: headache Pain Descriptors / Indicators: Discomfort;Headache Pain Intervention(s): Limited activity within patient's tolerance;Monitored during session           PT Goals (current goals can now be found in the care plan section) Acute Rehab PT Goals Patient Stated Goal: to be independent PT Goal Formulation: With patient Time For Goal Achievement: 06/06/17 Potential to Achieve Goals: Good Progress towards PT goals: Progressing toward goals    Frequency    Min 3X/week      PT Plan Current plan remains appropriate  AM-PAC PT "6 Clicks" Daily Activity  Outcome Measure  Difficulty turning over in bed (including adjusting bedclothes, sheets and blankets)?: None Difficulty moving from lying on back to sitting on the side of the bed? : None Difficulty sitting down on and standing up from a chair with arms (e.g., wheelchair, bedside commode, etc,.)?: None Help needed moving to and from a bed to chair (including a wheelchair)?: None Help needed walking in hospital room?: A  Little Help needed climbing 3-5 steps with a railing? : A Lot 6 Click Score: 21    End of Session Equipment Utilized During Treatment: Gait belt Activity Tolerance: Patient tolerated treatment well Patient left: with call bell/phone within reach;in chair Nurse Communication: Mobility status PT Visit Diagnosis: Muscle weakness (generalized) (M62.81)     Time: 1400-1410 PT Time Calculation (min) (ACUTE ONLY): 10 min  Charges:  $Gait Training: 8-22 mins                    G Codes:       Ireanna Finlayson B. Beverely RisenVan Fleet PT, DPT Acute Rehabilitation  [phone] Pager [phone]   Elon Alaslizabeth B Van Fleet 05/25/2017, 2:30 PM

## 2017-05-25 NOTE — Care Management Note (Signed)
Case Management Note  Patient Details  Name: [name] MRN: [id number] Date of Birth: 05/25/1946  Subjective/Objective:  Subacute Thyroiditis                 Action/Plan: Patient lives at home alone; PCP: [name], Stephen O, MD;  Has private insurance with [employer] with prescription drug coverage; [facility]; [facility] for [facility]; also DME ordered and to be delivered to the patient's room today prior to discharging home.patient requested ensure coupons; Nutritional Consult placed for [employer] Coupons.  Expected Discharge Date:  05/25/17               Expected Discharge Plan:  Home w Home Health Services  Discharge planning Services  CM Consult  Choice offered to:  Patient  DME Arranged:  3-N-1, Walker rolling DME Agency:  [facility]  HH Arranged:  PT, OT, Nurse's Aide HH Agency:  [facility]  Status of Service:  In process, will continue to follow  Reola MosherChandler, Adeyemi Hamad L, RN,MHA,BSN [phone] 05/25/2017, 1:52 PM

## 2017-05-25 NOTE — Progress Notes (Signed)
PT Cancellation Note  Patient Details Name: Sherlene ShamsCary Erbe MRN: [id number] DOB: 02/25/1946   Cancelled Treatment:    Reason Eval/Treat Not Completed: (P) Patient at procedure or test/unavailable. Pt currently undergoing stress test. PT will check back this afternoon as able. Thanks.  Lorrinda Ramstad B. Beverely RisenVan Fleet PT, DPT Acute Rehabilitation  [phone] Pager [phone]  Elon Alaslizabeth B Van Fleet 05/25/2017, 9:29 AM

## 2017-05-25 NOTE — Progress Notes (Signed)
Progress Note  Patient Name: [name] Date of Encounter: 05/25/2017  Primary Cardiologist: Dr. Katrinka BlazingSmith  Subjective   Feeling so/so prior to stress test, no specific symptoms.  Inpatient Medications    Scheduled Meds: . amLODipine  10 mg Oral Daily  . aspirin  81 mg Oral Once  . aspirin  325 mg Oral Daily  . atenolol  50 mg Oral BID  . atorvastatin  20 mg Oral Daily  . cefdinir  250 mg Oral BID  . enoxaparin (LOVENOX) injection  40 mg Subcutaneous Q24H  . ezetimibe  10 mg Oral Daily  . feeding supplement (ENSURE ENLIVE)  237 mL Oral BID BM  . ferrous sulfate  325 mg Oral Daily  . hydrocortisone  25 mg Rectal BID  . isosorbide mononitrate  120 mg Oral Daily  . lisinopril  20 mg Oral Daily  . oxybutynin  10 mg Oral Daily  . pantoprazole  40 mg Oral BID  . predniSONE  40 mg Oral Q breakfast  . regadenoson      . venlafaxine XR  150 mg Oral QHS  . vitamin B-12  100 mcg Oral QODAY  . vitamin C  500 mg Oral Daily  . vitamin E  1,000 Units Oral QODAY   Continuous Infusions:  PRN Meds: acetaminophen, hydrALAZINE, morphine injection, ondansetron, traMADol, zolpidem   Vital Signs    Vitals:   05/25/17 1001 05/25/17 1024 05/25/17 1026 05/25/17 1028  BP: (!) 157/61 140/62 132/66 (!) 144/66  Pulse:      Resp:      Temp:      TempSrc:      SpO2:      Weight:      Height:        Intake/Output Summary (Last 24 hours) at 05/25/17 1036 Last data filed at 05/24/17 2209  Gross per 24 hour  Intake              240 ml  Output              820 ml  Net             -580 ml   Filed Weights   05/23/17 0030 05/24/17 0559 05/25/17 0521  Weight: 124 lb 9 oz (56.5 kg) 125 lb 3.2 oz (56.8 kg) 126 lb 8 oz (57.4 kg)    Telemetry    NSR rare PAC - Personally Reviewed  Physical Exam   GEN: No acute distress. Thin AAF HEENT: Normocephalic, atraumatic, sclera non-icteric. Neck: No JVD or bruits. Cardiac: RRR no murmurs, rubs, or gallops.  Radials/DP/PT 1+ and equal  bilaterally.  Respiratory: Clear to auscultation bilaterally. Breathing is unlabored. GI: Soft, nontender, non-distended, BS +x 4. MS: no deformity. Extremities: No clubbing or cyanosis. No edema. Distal pedal pulses are 2+ and equal bilaterally. Neuro:  AAOx3. Follows commands. Psych:  Responds to questions appropriately with a normal affect.  Labs    Chemistry Recent Labs Lab 05/21/17 1122 05/22/17 1829 05/23/17 0514 05/24/17 0635  NA 140 141 140 140  K 3.2* 3.4* 3.0* 3.5  CL 106 104 104 108  CO2 26 27 27 26   GLUCOSE 117* 108* 99 93  BUN 13 10 8 15   CREATININE 0.83 0.67 0.57 0.68  CALCIUM 10.1 10.6* 9.7 10.0  PROT 6.3* 7.3  --   --   ALBUMIN 3.1* 3.5  --   --   AST 40 36  --   --   ALT 28 31  --   --  ALKPHOS 50 59  --   --   BILITOT 1.0 1.4*  --   --   GFRNONAA >60 >60 >60 >60  GFRAA >60 >60 >60 >60  ANIONGAP 8 10 9 6      Hematology Recent Labs Lab 05/22/17 1829 05/23/17 0514 05/24/17 0635  WBC 4.2 4.6 4.9  RBC 3.91 3.61* 3.34*  HGB 11.5* 10.4* 9.6*  HCT 34.9* 32.1* 29.8*  MCV 89.3 88.9 89.2  MCH 29.4 28.8 28.7  MCHC 33.0 32.4 32.2  RDW 14.0 13.9 14.0  PLT 297 290 265    Cardiac Enzymes Recent Labs Lab 05/23/17 0001 05/23/17 0514 05/23/17 1002  TROPONINI 0.27* 0.25* 0.20*    Recent Labs Lab 05/22/17 1843 05/22/17 2134  TROPIPOC 0.15* 0.12*     BNP Recent Labs Lab 05/22/17 1829  BNP 900.5*     DDimer  Recent Labs Lab 05/22/17 1829  DDIMER 1.42*     Radiology    No results found.  Cardiac Studies   TTE: 05/23/17 - Mild LVH with LVEF approximately 55%. Mild hypokinesis of the basal inferior wall. Grade 2 diastolic dysfunction. Severe left atrial enlargement. Mildly thickened mitral leaflets with mild to moderate mitral regurgitation. Mild tricuspid regurgitation with PASP estimated 46 mmHg.  Patient Profile     71 y.o. female with HTN, HLD, subacute thyroiditis, recent GI bleed and CAD with coronary spasmwho is  being seen today for the evaluation of chest pain and elevated tropat the request of Dr. Lorretta HarpXilin Niu  Assessment & Plan    1. Chest discomfort with elevated troponin: Possibly demand ischemia in the settings of UTI, treated by iv Ceftriaxone. Troponin now max 0.27. Echo showed LVEF 55%, possible hypokinesis in 1 segment. Baseline EKG abnormal. Lexiscan nuc performed today for risk stratification - She has mild anemia, but negative FOBT.  2. Subacute thyroiditis with hyperthyroidism: Free T3 12.8 fivedays ago, T4 also elevated with TSH low. Likely also contributed to her losing 20 pounds recently. Being managed by IM.  3. Possible UTI: Receiving IV Rocephin per IM.  4. History of nonobstructive CAD with coronary spasm: Noted to have diffuse moderate disease in 2003. Await scan results.  5. Hypertension - generally controlled down in nuc med.  6. Hyperlipidemia - on statin.  7. Hypokalemia - consider low dose standing repletion if K falls subtherapeutic.  8. Possible GI bleed: FOBT negative but patient reported blood in the stool for the past 2 months. Management per primary team. Hgb 9-10 range recently.  Signed, Laurann Montanaayna N Dunn, PA-C  05/25/2017, 10:36 AM    The patient was seen, examined and discussed with Ronie Spiesayna Dunn, PA-C and I agree with the above.   Abnormal stress test - personally reviewed, small ischemia in the apical anterior and moderate ischemia in the basal and mid inferior walls. We will plan for a LHC tomorrow. She agrees.   Tobias AlexanderKatarina Farrell Broerman, MD 05/25/2017

## 2017-05-25 NOTE — Discharge Summary (Signed)
Physician Discharge Summary  [name] MRN: [id number] DOB/AGE: [birth year] 71 y.o.  PCP: Marin Olp, MD   Admit date: 05/22/2017 Discharge date: 05/25/2017  Discharge Diagnoses:    Principal Problem:   Subacute thyroiditis Active Problems:   HTN (hypertension)   GERD (gastroesophageal reflux disease)   UTI (urinary tract infection)   Hypercholesteremia   CAD (coronary artery disease)   Hypokalemia   Hyperthyroidism   Elevated lactic acid level   Generalized weakness   Dark stools   Elevated troponin   Hypomagnesemia   Hypercalcemia   Protein-calorie malnutrition, moderate (HCC)   NSTEMI (non-ST elevated myocardial infarction) ([place])   Demand ischemia (HCC)    Follow-up recommendations Follow-up with PCP in 3-5 days , including all  additional recommended appointments as below Follow-up CBC, CMP in 3-5 days Patient needs to follow-up with endocrinology as soon as possible for hyperthyroidism     Current Discharge Medication List    START taking these medications   Details  atenolol (TENORMIN) 50 MG tablet Take 1 tablet (50 mg total) by mouth daily. Qty: 60 tablet, Refills: 2    cefdinir (OMNICEF) 125 MG/5ML suspension Take 10 mLs (250 mg total) by mouth 2 (two) times daily. Qty: 60 mL, Refills: 0    feeding supplement, ENSURE ENLIVE, (ENSURE ENLIVE) LIQD Take 237 mLs by mouth 2 (two) times daily between meals. Qty: 237 mL, Refills: 12      CONTINUE these medications which have NOT CHANGED   Details  acetaminophen (TYLENOL) 325 MG tablet Take 650 mg by mouth every 6 (six) hours as needed for moderate pain.     Aloe Vera 25 MG CAPS Take 25 mg by mouth daily.     amLODipine (NORVASC) 10 MG tablet take 1 tablet by mouth once daily Qty: 30 tablet, Refills: 5    aspirin EC 81 MG EC tablet Take 1 tablet (81 mg total) by mouth daily.    atorvastatin (LIPITOR) 20 MG tablet take 1 tablet by mouth once daily Qty: 90 tablet, Refills: 1    cholecalciferol  (VITAMIN D) 400 UNITS TABS tablet Take 400 Units by mouth every other day.     Cyanocobalamin (VITAMIN B 12 PO) Take 1 capsule by mouth every other day.     esomeprazole (NEXIUM) 40 MG capsule Take 1 capsule (40 mg total) by mouth daily. Qty: 90 capsule, Refills: 3    ezetimibe (ZETIA) 10 MG tablet take 1 tablet by mouth once daily Qty: 30 tablet, Refills: 5    ferrous sulfate 325 (65 FE) MG tablet Take 1 tablet (325 mg total) by mouth daily. Qty: 30 tablet, Refills: 0    hydrocortisone (ANUSOL-HC) 25 MG suppository Place 1 suppository (25 mg total) rectally 2 (two) times daily. Qty: 12 suppository, Refills: 0    isosorbide mononitrate (IMDUR) 60 MG 24 hr tablet take 2 tablets by mouth once daily Qty: 60 tablet, Refills: 5    lisinopril (PRINIVIL,ZESTRIL) 20 MG tablet take 1 tablet by mouth once daily Qty: 90 tablet, Refills: 1    nitroGLYCERIN (NITROSTAT) 0.4 MG SL tablet place 1 tablet under the tongue if needed every 5 minutes for chest pain for 3 doses Qty: 25 tablet, Refills: 1    oxybutynin (DITROPAN-XL) 10 MG 24 hr tablet Take 1 tablet (10 mg total) by mouth daily. Qty: 90 tablet, Refills: 3    potassium chloride (KLOR-CON 10) 10 MEQ tablet Take 1 tablet (10 mEq total) by mouth 2 (two) times daily. Qty: 60 tablet,  Refills: 5    traMADol (ULTRAM) 50 MG tablet take 1 tablet by mouth up to once daily for migraines Qty: 90 tablet, Refills: 1    venlafaxine XR (EFFEXOR-XR) 150 MG 24 hr capsule take 1 capsule by mouth at bedtime Qty: 30 capsule, Refills: 5    vitamin C (ASCORBIC ACID) 500 MG tablet Take 500 mg by mouth every other day.     vitamin E (VITAMIN E) 1000 UNIT capsule Take 1,000 Units by mouth every other day.       STOP taking these medications     ACAI PO      aspirin 325 MG tablet      Bee Pollen 550 MG CAPS      carvedilol (COREG) 6.25 MG tablet           Discharge Condition: * Stable  Discharge Instructions Get Medicines reviewed and  adjusted: Please take all your medications with you for your next visit with your Primary MD  Please request your Primary MD to go over all hospital tests and procedure/radiological results at the follow up, please ask your Primary MD to get all Hospital records sent to his/her office.  If you experience worsening of your admission symptoms, develop shortness of breath, life threatening emergency, suicidal or homicidal thoughts you must seek medical attention immediately by calling 911 or calling your MD immediately if symptoms less severe.  You must read complete instructions/literature along with all the possible adverse reactions/side effects for all the Medicines you take and that have been prescribed to you. Take any new Medicines after you have completely understood and accpet all the possible adverse reactions/side effects.   Do not drive when taking Pain medications.   Do not take more than prescribed Pain, Sleep and Anxiety Medications  Special Instructions: If you have smoked or chewed Tobacco in the last 2 yrs please stop smoking, stop any regular Alcohol and or any Recreational drug use.  Wear Seat belts while driving.  Please note  You were cared for by a hospitalist during your hospital stay. Once you are discharged, your primary care physician will handle any further medical issues. Please note that NO REFILLS for any discharge medications will be authorized once you are discharged, as it is imperative that you return to your primary care physician (or establish a relationship with a primary care physician if you do not have one) for your aftercare needs so that they can reassess your need for medications and monitor your lab values.     Allergies  Allergen Reactions  . Sulfa Drugs Cross Reactors Shortness Of Breath and Palpitations      Disposition: Home with home health   Consults:  Cardiology    Significant Diagnostic Studies:  Dg Chest 2 View  Result  Date: 05/22/2017 CLINICAL DATA:  Shortness of breath and chest pain EXAM: CHEST  2 VIEW COMPARISON:  Chest radiograph 03/10/2016 FINDINGS: The heart size and mediastinal contours are within normal limits. Both lungs are clear. The visualized skeletal structures are unremarkable. IMPRESSION: No active cardiopulmonary disease. Electronically Signed   By: Ulyses Jarred M.D.   On: 05/22/2017 20:01   Ct Head Wo Contrast  Result Date: 05/21/2017 CLINICAL DATA:  Unsteady gait.  Headache. EXAM: CT HEAD WITHOUT CONTRAST TECHNIQUE: Contiguous axial images were obtained from the base of the skull through the vertex without intravenous contrast. COMPARISON:  09/10/2011. FINDINGS: Brain: No evidence of acute infarction, hemorrhage, hydrocephalus, extra-axial collection or mass lesion/mass effect. Vascular: No hyperdense  vessel or unexpected calcification. Skull: Stable right lateral skull osteoma. Sinuses/Orbits: Unremarkable. Other: None. IMPRESSION: No acute abnormality. Electronically Signed   By: Claudie Revering M.D.   On: 05/21/2017 12:38   Ct Angio Chest Pe W And/or Wo Contrast  Result Date: 05/22/2017 CLINICAL DATA:  Chest pain, shortness of breath. EXAM: CT ANGIOGRAPHY CHEST WITH CONTRAST TECHNIQUE: Multidetector CT imaging of the chest was performed using the standard protocol during bolus administration of intravenous contrast. Multiplanar CT image reconstructions and MIPs were obtained to evaluate the vascular anatomy. CONTRAST:  100 mL of Isovue 370 intravenously. COMPARISON:  CT scan of April 06, 2005.  Radiographs of same day. FINDINGS: Cardiovascular: Satisfactory opacification of the pulmonary arteries to the segmental level. No evidence of pulmonary embolism. Normal heart size. No pericardial effusion. Atherosclerosis of thoracic aorta is noted without aneurysm or dissection. Mediastinum/Nodes: No enlarged mediastinal, hilar, or axillary lymph nodes. Thyroid gland, trachea, and esophagus demonstrate no  significant findings. Lungs/Pleura: Lungs are clear. No pleural effusion or pneumothorax. Upper Abdomen: No acute abnormality. Musculoskeletal: No chest wall abnormality. No acute or significant osseous findings. Review of the MIP images confirms the above findings. IMPRESSION: No definite evidence of pulmonary embolus. No acute cardiopulmonary abnormality is seen. Aortic Atherosclerosis (ICD10-I70.0). Electronically Signed   By: Marijo Conception, M.D.   On: 05/22/2017 21:22   US Thyroid  Result Date: 05/23/2017 CLINICAL DATA:  Subacute thyroiditis EXAM: THYROID ULTRASOUND TECHNIQUE: Ultrasound examination of the thyroid gland and adjacent soft tissues was performed. COMPARISON:  None. FINDINGS: Parenchymal Echotexture: Mildly heterogenous, hyperemic Isthmus: 0.2 cm thickness Right lobe: 4.6 x 2.3 x 1.8 cm Left lobe: 4.4 x 2 x 1.4 cm _________________________________________________________ Estimated total number of nodules >/= 1 cm: 0 Number of spongiform nodules >/=  2 cm not described below (TR1): 0 Number of mixed cystic and solid nodules >/= 1.5 cm not described below (TR2): 0 _________________________________________________________ No discrete nodules are seen within the thyroid gland. IMPRESSION: 1. Heterogeneous hyperemic thyroid, normal in size without nodule. The above is in keeping with the ACR TI-RADS recommendations - J Am Coll Radiol 2017;14:587-595. Electronically Signed   By: Lucrezia Europe M.D.   On: 05/23/2017 10:58     Echocardiogram LV EF: 55%  ------------------------------------------------------------------- Indications:      Chest pain 786.51.  ------------------------------------------------------------------- History:   PMH:   Coronary artery disease.  Risk factors: Hypertension. Dyslipidemia.  ------------------------------------------------------------------- Study Conclusions  - Left ventricle: The cavity size was normal. Wall thickness was   increased in a pattern  of mild LVH. The estimated ejection   fraction was 55%. There is mild hypokinesis of the basalinferior   myocardium. Features are consistent with a pseudonormal left   ventricular filling pattern, with concomitant abnormal relaxation   and increased filling pressure (grade 2 diastolic dysfunction). - Aortic valve: Mildly calcified annulus. Trileaflet. - Mitral valve: Mildly thickened leaflets . There was mild to   moderate regurgitation. - Left atrium: The atrium was severely dilated. - Right atrium: Central venous pressure (est): 8 mm Hg. - Atrial septum: No defect or patent foramen ovale was identified. - Tricuspid valve: There was mild regurgitation. - Pulmonary arteries: PA peak pressure: 46 mm Hg (S). - Pericardium, extracardiac: There was no pericardial effusion.  Impressions:  - Mild LVH with LVEF approximately 55%. Mild hypokinesis of the   basal inferior wall. Grade 2 diastolic dysfunction. Severe left   atrial enlargement. Mildly thickened mitral leaflets with mild to   moderate mitral regurgitation. Mild tricuspid  regurgitation with   PASP estimated 46 mmHg.   Left Heart Cath and Coronary Angiography  Conclusion     Prox RCA to Mid RCA lesion, 10 %stenosed.  Dist Cx lesion, 20 %stenosed.  Mid LAD lesion, 10 %stenosed.  There is no aortic valve stenosis.  Ost 1st Mrg lesion, 25 %stenosed.  The left ventricular systolic function is normal.  LV end diastolic pressure is normal.  The left ventricular ejection fraction is 55-65% by visual estimate.   No obstructive CAD.  Likely demand ischemia.  Continue preventive therapy.       Filed Weights   05/23/17 0030 05/24/17 0559 05/25/17 0521  Weight: 56.5 kg (124 lb 9 oz) 56.8 kg (125 lb 3.2 oz) 57.4 kg (126 lb 8 oz)     Microbiology: Recent Results (from the past 240 hour(s))  Urine culture     Status: Abnormal   Collection Time: 05/22/17  6:29 PM  Result Value Ref Range Status   Specimen Description  URINE, RANDOM  Final   Special Requests NONE  Final   Culture >=100,000 COLONIES/mL ESCHERICHIA COLI (A)  Final   Report Status 05/24/2017 FINAL  Final   Organism ID, Bacteria ESCHERICHIA COLI (A)  Final      Susceptibility   Escherichia coli - MIC*    AMPICILLIN <=2 SENSITIVE Sensitive     CEFAZOLIN <=4 SENSITIVE Sensitive     CEFTRIAXONE <=1 SENSITIVE Sensitive     CIPROFLOXACIN <=0.25 SENSITIVE Sensitive     GENTAMICIN <=1 SENSITIVE Sensitive     IMIPENEM <=0.25 SENSITIVE Sensitive     NITROFURANTOIN <=16 SENSITIVE Sensitive     TRIMETH/SULFA <=20 SENSITIVE Sensitive     AMPICILLIN/SULBACTAM <=2 SENSITIVE Sensitive     PIP/TAZO <=4 SENSITIVE Sensitive     Extended ESBL NEGATIVE Sensitive     * >=100,000 COLONIES/mL ESCHERICHIA COLI  Culture, blood (Routine X 2) w Reflex to ID Panel     Status: None (Preliminary result)   Collection Time: 05/22/17  6:39 PM  Result Value Ref Range Status   Specimen Description BLOOD RIGHT ANTECUBITAL  Final   Special Requests IN PEDIATRIC BOTTLE Blood Culture adequate volume  Final   Culture NO GROWTH 1 DAY  Final   Report Status PENDING  Incomplete  Culture, blood (Routine X 2) w Reflex to ID Panel     Status: None (Preliminary result)   Collection Time: 05/23/17 12:05 AM  Result Value Ref Range Status   Specimen Description BLOOD RIGHT HAND  Final   Special Requests   Final    BOTTLES DRAWN AEROBIC ONLY Blood Culture adequate volume   Culture NO GROWTH 1 DAY  Final   Report Status PENDING  Incomplete  Rapid strep screen (not at [facility])     Status: None   Collection Time: 05/23/17  2:30 AM  Result Value Ref Range Status   Streptococcus, Group A Screen (Direct) NEGATIVE NEGATIVE Final    Comment: (NOTE) A Rapid Antigen test may result negative if the antigen level in the sample is below the detection level of this test. The FDA has not cleared this test as a stand-alone test therefore the rapid antigen negative result has reflexed to a Group  A Strep culture.   Culture, group A strep     Status: None (Preliminary result)   Collection Time: 05/23/17  2:30 AM  Result Value Ref Range Status   Specimen Description THROAT  Final   Special Requests NONE Reflexed from [id number]  Final  Culture CULTURE REINCUBATED FOR BETTER GROWTH  Final   Report Status PENDING  Incomplete       Blood Culture    Component Value Date/Time   SDES THROAT 05/23/2017 0230   SPECREQUEST NONE Reflexed from [id number] 05/23/2017 0230   CULT CULTURE REINCUBATED FOR BETTER GROWTH 05/23/2017 0230   REPTSTATUS PENDING 05/23/2017 0230      Labs: Results for orders placed or performed during the hospital encounter of 05/22/17 (from the past 48 hour(s))  Troponin I (q 6hr x 3)     Status: Abnormal   Collection Time: 05/23/17 10:02 AM  Result Value Ref Range   Troponin I 0.20 (HH) <0.03 ng/mL    Comment: CRITICAL VALUE NOTED.  VALUE IS CONSISTENT WITH PREVIOUSLY REPORTED AND CALLED VALUE.  Heparin level (unfractionated)     Status: Abnormal   Collection Time: 05/23/17 10:02 AM  Result Value Ref Range   Heparin Unfractionated <0.10 (L) 0.30 - 0.70 IU/mL    Comment:        IF HEPARIN RESULTS ARE BELOW EXPECTED VALUES, AND PATIENT DOSAGE HAS BEEN CONFIRMED, SUGGEST FOLLOW UP TESTING OF ANTITHROMBIN III LEVELS.   Magnesium     Status: None   Collection Time: 05/23/17 10:02 AM  Result Value Ref Range   Magnesium 1.8 1.7 - 2.4 mg/dL  Glucose, capillary     Status: Abnormal   Collection Time: 05/24/17  5:58 AM  Result Value Ref Range   Glucose-Capillary 110 (H) 65 - 99 mg/dL  Basic metabolic panel     Status: None   Collection Time: 05/24/17  6:35 AM  Result Value Ref Range   Sodium 140 135 - 145 mmol/L   Potassium 3.5 3.5 - 5.1 mmol/L   Chloride 108 101 - 111 mmol/L   CO2 26 22 - 32 mmol/L   Glucose, Bld 93 65 - 99 mg/dL   BUN 15 6 - 20 mg/dL   Creatinine, Ser 0.68 0.44 - 1.00 mg/dL   Calcium 10.0 8.9 - 10.3 mg/dL   GFR calc non Af Amer >60 >60  mL/min   GFR calc Af Amer >60 >60 mL/min    Comment: (NOTE) The eGFR has been calculated using the CKD EPI equation. This calculation has not been validated in all clinical situations. eGFR's persistently <60 mL/min signify possible Chronic Kidney Disease.    Anion gap 6 5 - 15  Magnesium     Status: None   Collection Time: 05/24/17  6:35 AM  Result Value Ref Range   Magnesium 1.9 1.7 - 2.4 mg/dL  CBC     Status: Abnormal   Collection Time: 05/24/17  6:35 AM  Result Value Ref Range   WBC 4.9 4.0 - 10.5 K/uL   RBC 3.34 (L) 3.87 - 5.11 MIL/uL   Hemoglobin 9.6 (L) 12.0 - 15.0 g/dL   HCT 29.8 (L) 36.0 - 46.0 %   MCV 89.2 78.0 - 100.0 fL   MCH 28.7 26.0 - 34.0 pg   MCHC 32.2 30.0 - 36.0 g/dL   RDW 14.0 11.5 - 15.5 %   Platelets 265 150 - 400 K/uL  Glucose, capillary     Status: Abnormal   Collection Time: 05/25/17  6:43 AM  Result Value Ref Range   Glucose-Capillary 105 (H) 65 - 99 mg/dL   Comment 1 Notify RN    Comment 2 Document in Chart      Lipid Panel     Component Value Date/Time   CHOL 75 05/23/2017 0514  TRIG 58 05/23/2017 0514   HDL 25 (L) 05/23/2017 0514   CHOLHDL 3.0 05/23/2017 0514   VLDL 12 05/23/2017 0514   LDLCALC 38 05/23/2017 0514     Lab Results  Component Value Date   HGBA1C 5.8 (H) 05/23/2017   HGBA1C 5.9 (H) 05/17/2014       HPI :  Chief Complaint: Generalized weakness, SOB, chest discomfort, neck pain, sore throat, dysuria, suprapubic abdominal pain.  HPI: [name] is a 71 y.o. female with medical history significant of hypertension, hyperlipidemia, GERD, diverticulitis, CAD, stent placement, back pain, PVC, GI bleeding, who presents with generalized weakness, SOB, chest discomfort, neck pain, sore throat, dysuria, suprapubic abdominal pain.  Patient states that she has not been feeling well in the past 3 mouth. She has multiple complaints, including generalized weakness, body aches, shortness of breath, chest discomfort, neck pain,  sore throat, dysuria, suprapubic abdominal pain.  Patient states that she has shortness of breath intermittently, and mild chest discomfort sometimes. She has dry cough, no fever or chills. She has body aches, generalized weakness. Recently she has front neck pain, sore throat, but no runny nose. She has nausea, no vomiting or diarrhea. She has suprapubic abdominal pain, which is 2 out of 10 in severity, intermittent, nonradiating. She has intermittent dysuria, burning on urination and increased urinary frequency. She also has bilateral shoulder pain, but no swelling or redness in shoulders. No unilateral weakness. No hematochezia, hematemesis or hematuria. She states that she lost 20 pounds in the past 3 mouth. She reports that she was seen yesterday and was told that she has thyroid problems and possibly a GI bleed. She was told to follow-up with 2 different specialist, GI and endocrine doctor. Her TSH was<0.01 yesterday. Her free T3 was 12.8 and T4 3.64 on 05/18/17. Patient reports dark stool twice.   ED Course: pt was found to have troponin 0.12-->0.27, negative FOBT, lactic acid 2.018, 122, INR 1.3, BNP 100.5, lipase 33, potassium 3.4, creatinine normal, urinalysis with small amount of leukocyte and many bacteria's, temperature normal, tachycardia, tachypnea, oxygen saturation 100% on room air, chest x-rays negative. CT angiogram of chest is negative for PE. Patient is placed on telemetry bed for observation.  HOSPITAL COURSE:   # NSTEMI:Troponin abnormal,0.27>0.25>0.20 -Patient presented with chest pain or shortness of breath and found to have serum troponin level of 0.27. Started on IV heparin on admission. -Patient denied chest pain . Evaluated by cardiologist. Dr. Domenic Polite. He recommended stoppingIV heparin and obtainingechocardiogram.Echocardiogram shows Mild hypokinesis of the basal inferior wall -Continue aspirin, Lipitor, beta blocker, Imdur, lisinopril Venous Doppler negative for  DVT CT chest negative for PE As per Dr. Meda Coffee, patient underwent Lexiscan nuclear stress  which was abnormal , small ischemia in the apical anterior and moderate ischemia in the basal and mid inferior walls.    LHC shows nonobstructive CAD . Likely demand ischemia, continue preventative therapy .  #Possible subacute thyroiditis:Patient has thyroid gland tenderness. -Patient with severely suppressed TSH  <0.01 associated with elevated T3 12.8 and T4 level 3.64  on July 24. Started on atenolol. Ultrasound of neck consistent with heterogeneous hyperemic thyroid without nodules. Patient will need to follow up with endocrinologist outpatient on an urgent basis. May benefit from methimazole,    # Possible acute cystitis without hematuria: DiscontinuedIV ceftriaxone. Change to [place] 2 more days  #GERD: Continue Protonix  #Hypertension: Continue to monitor blood pressure. Continue current cardiac medications.  #Hyperlipidemia: Continue Lipitor and Zetia  #Severe hypokalemia and hypomagnesemia: Repleted  IV and oral potassium chloride  #Recent history of GI bleed: as per H&P Fecal occult blood test negative. Continue Protonix.  #Moderate protein calorie malnutrition: Continue nutrition consult  #Generalized weakness likely contributed by thyroid disease and comorbidities: Patient will be discharged with home health PT/OT    Discharge Exam:   Blood pressure 115/69, pulse 70, temperature 98.8 F (37.1 C), temperature source Oral, resp. rate 18, height 5' 6"  (1.676 m), weight 57.4 kg (126 lb 8 oz), SpO2 99 %. GEN:No acute distress.   Neck:No JVD Cardiac:RRR, no murmurs, rubs, or gallops.  Respiratory:Clear to auscultation bilaterally. XH:FSFS, nontender, non-distended  MS:No edema; No deformity. Neuro:Nonfocal  Psych: Normal affect         Signed: Reyne Dumas 05/25/2017, 8:47 AM        Time spent >1 hour

## 2017-05-25 NOTE — Progress Notes (Signed)
   [name] presented for a nuclear stress test today.  No immediate complications.  Stress imaging is pending at this time.  Preliminary EKG findings may be listed in the chart, but the stress test result will not be finalized until perfusion imaging is complete.  Laurann Montanaayna N Dunn, PA-C 05/25/2017, 10:36 AM

## 2017-05-26 ENCOUNTER — Encounter (HOSPITAL_COMMUNITY)
Admission: EM | Disposition: A | Discharge status: Home Health | Payer: Self-pay | Source: Home / Self Care | Attending: Internal Medicine

## 2017-05-26 ENCOUNTER — Ambulatory Visit (HOSPITAL_COMMUNITY): Admission: RE | Admit: 2017-05-26 | Payer: Medicare HMO | Source: Ambulatory Visit | Admitting: Cardiovascular Disease

## 2017-05-26 DIAGNOSIS — R748 Abnormal levels of other serum enzymes: Secondary | ICD-10-CM

## 2017-05-26 HISTORY — PX: LEFT HEART CATH AND CORONARY ANGIOGRAPHY: CATH118249

## 2017-05-26 LAB — GLUCOSE, CAPILLARY: GLUCOSE-CAPILLARY: 100 mg/dL — AB (ref 65–99)

## 2017-05-26 LAB — PROTIME-INR
INR: 1.21
PROTHROMBIN TIME: 15.4 s — AB (ref 11.4–15.2)

## 2017-05-26 LAB — PARATHYROID HORMONE, INTACT (NO CA): PTH: 23 pg/mL (ref 15–65)

## 2017-05-26 SURGERY — LEFT HEART CATH AND CORONARY ANGIOGRAPHY
Anesthesia: LOCAL

## 2017-05-26 MED ORDER — NITROGLYCERIN 0.4 MG SL SUBL: 0.4000 mg | SUBLINGUAL_TABLET | SUBLINGUAL | Status: DC | PRN

## 2017-05-26 MED ORDER — IOPAMIDOL (ISOVUE-370) INJECTION 76%
INTRAVENOUS | Status: AC
  Filled 2017-05-26: qty 100

## 2017-05-26 MED ORDER — SODIUM CHLORIDE 0.9 % IV SOLN: 250.0000 mL | INTRAVENOUS | Status: DC | PRN

## 2017-05-26 MED ORDER — VERAPAMIL HCL 2.5 MG/ML IV SOLN
INTRAVENOUS | Status: DC | PRN
  Administered 2017-05-26: 10 mL via INTRA_ARTERIAL

## 2017-05-26 MED ORDER — SODIUM CHLORIDE 0.9 % IV SOLN: INTRAVENOUS | Status: AC

## 2017-05-26 MED ORDER — MIDAZOLAM HCL 2 MG/2ML IJ SOLN
INTRAMUSCULAR | Status: AC
  Filled 2017-05-26: qty 2

## 2017-05-26 MED ORDER — VERAPAMIL HCL 2.5 MG/ML IV SOLN
INTRAVENOUS | Status: AC
  Filled 2017-05-26: qty 2

## 2017-05-26 MED ORDER — FENTANYL CITRATE (PF) 100 MCG/2ML IJ SOLN
INTRAMUSCULAR | Status: AC
  Filled 2017-05-26: qty 2

## 2017-05-26 MED ORDER — LIDOCAINE HCL (PF) 1 % IJ SOLN
INTRAMUSCULAR | Status: AC
  Filled 2017-05-26: qty 30

## 2017-05-26 MED ORDER — MIDAZOLAM HCL 2 MG/2ML IJ SOLN
INTRAMUSCULAR | Status: DC | PRN
  Administered 2017-05-26 (×2): 1 mg via INTRAVENOUS

## 2017-05-26 MED ORDER — HEPARIN SODIUM (PORCINE) 1000 UNIT/ML IJ SOLN
INTRAMUSCULAR | Status: DC | PRN
  Administered 2017-05-26: 3000 [IU] via INTRAVENOUS

## 2017-05-26 MED ORDER — FENTANYL CITRATE (PF) 100 MCG/2ML IJ SOLN
INTRAMUSCULAR | Status: DC | PRN
  Administered 2017-05-26 (×2): 25 ug via INTRAVENOUS

## 2017-05-26 MED ORDER — SODIUM CHLORIDE 0.9% FLUSH: 3.0000 mL | INTRAVENOUS | Status: DC | PRN

## 2017-05-26 MED ORDER — HEPARIN SODIUM (PORCINE) 1000 UNIT/ML IJ SOLN
INTRAMUSCULAR | Status: AC
  Filled 2017-05-26: qty 1

## 2017-05-26 MED ORDER — LIDOCAINE HCL (PF) 1 % IJ SOLN
INTRAMUSCULAR | Status: DC | PRN
  Administered 2017-05-26: 2 mL via SUBCUTANEOUS

## 2017-05-26 MED ORDER — HEPARIN (PORCINE) IN NACL 2-0.9 UNIT/ML-% IJ SOLN
INTRAMUSCULAR | Status: AC | PRN
  Administered 2017-05-26: 1000 mL

## 2017-05-26 MED ORDER — SODIUM CHLORIDE 0.9% FLUSH
3.0000 mL | Freq: Two times a day (BID) | INTRAVENOUS | Status: DC
  Administered 2017-05-26 – 2017-05-27 (×2): 3 mL via INTRAVENOUS

## 2017-05-26 MED ORDER — HEPARIN (PORCINE) IN NACL 2-0.9 UNIT/ML-% IJ SOLN
INTRAMUSCULAR | Status: AC
  Filled 2017-05-26: qty 1000

## 2017-05-26 MED ORDER — ACETAMINOPHEN 325 MG PO TABS
650.0000 mg | ORAL_TABLET | ORAL | Status: DC | PRN
  Administered 2017-05-26: 650 mg via ORAL
  Filled 2017-05-26: qty 2

## 2017-05-26 MED ORDER — IOPAMIDOL (ISOVUE-370) INJECTION 76%
INTRAVENOUS | Status: DC | PRN
  Administered 2017-05-26: 50 mL via INTRA_ARTERIAL

## 2017-05-26 MED ORDER — ONDANSETRON HCL 4 MG/2ML IJ SOLN: 4.0000 mg | Freq: Four times a day (QID) | INTRAMUSCULAR | Status: DC | PRN

## 2017-05-26 SURGICAL SUPPLY — 9 items
CATH 5FR JL3.5 JR4 ANG PIG MP (CATHETERS) ×2 IMPLANT
DEVICE RAD COMP TR BAND LRG (VASCULAR PRODUCTS) ×2 IMPLANT
GLIDESHEATH SLEND SS 6F .021 (SHEATH) ×2 IMPLANT
GUIDEWIRE INQWIRE 1.5J.035X260 (WIRE) ×1 IMPLANT
INQWIRE 1.5J .035X260CM (WIRE) ×2
KIT HEART LEFT (KITS) ×2 IMPLANT
PACK CARDIAC CATHETERIZATION (CUSTOM PROCEDURE TRAY) ×2 IMPLANT
TRANSDUCER W/STOPCOCK (MISCELLANEOUS) ×2 IMPLANT
TUBING CIL FLEX 10 FLL-RA (TUBING) ×2 IMPLANT

## 2017-05-26 NOTE — Discharge Instructions (Signed)

## 2017-05-26 NOTE — Progress Notes (Signed)
Brief Nutrition Note:   Provided pt with Boost Coupons (no Ensure coupons available at present time) and provided pt information on how to access more free coupons after discharge. Information regarding nutritional supplements placed in discharge instructions  Romelle StarcherCate Darus Hershman MS, RD, LDN [phone] Pager  [phone] Weekend/On-Call Pager

## 2017-05-26 NOTE — Interval H&P Note (Signed)
Cath Lab Visit (complete for each Cath Lab visit)  Clinical Evaluation Leading to the Procedure:   ACS: Yes.    Non-ACS:    Anginal Classification: CCS IV  Anti-ischemic medical therapy: Minimal Therapy (1 class of medications)  Non-Invasive Test Results: No non-invasive testing performed  Prior CABG: No previous CABG  H/o GI bleed and some anemia.    History and Physical Interval Note:  05/26/2017 4:41 PM  [name]  has presented today for surgery, with the diagnosis of cp  The various methods of treatment have been discussed with the patient and family. After consideration of risks, benefits and other options for treatment, the patient has consented to  Procedure(s): Left Heart Cath and Coronary Angiography (N/A) as a surgical intervention .  The patient's history has been reviewed, patient examined, no change in status, stable for surgery.  I have reviewed the patient's chart and labs.  Questions were answered to the patient's satisfaction.     Lance MussJayadeep Jenine Krisher

## 2017-05-26 NOTE — H&P (View-Only) (Signed)
 Progress Note  Patient Name: [name] Date of Encounter: 05/26/2017  Primary Cardiologist: Dr. Smith  Subjective   She has mild left sided discomfort.  Inpatient Medications    Scheduled Meds: . amLODipine  10 mg Oral Daily  . aspirin  81 mg Oral Once  . atenolol  50 mg Oral BID  . atorvastatin  20 mg Oral Daily  . cefdinir  250 mg Oral BID  . enoxaparin (LOVENOX) injection  40 mg Subcutaneous Q24H  . ezetimibe  10 mg Oral Daily  . feeding supplement (ENSURE ENLIVE)  237 mL Oral BID BM  . ferrous sulfate  325 mg Oral Daily  . hydrocortisone  25 mg Rectal BID  . isosorbide mononitrate  120 mg Oral Daily  . lisinopril  20 mg Oral Daily  . oxybutynin  10 mg Oral Daily  . pantoprazole  40 mg Oral BID  . predniSONE  40 mg Oral Q breakfast  . sodium chloride flush  3 mL Intravenous Q12H  . venlafaxine XR  150 mg Oral QHS  . vitamin B-12  100 mcg Oral QODAY  . vitamin C  500 mg Oral Daily  . vitamin E  1,000 Units Oral QODAY   Continuous Infusions: . sodium chloride    . sodium chloride 1 mL/kg/hr (05/26/17 0700)   PRN Meds: sodium chloride, acetaminophen, hydrALAZINE, morphine injection, ondansetron, sodium chloride flush, traMADol, zolpidem   Vital Signs    Vitals:   05/25/17 1028 05/25/17 2100 05/26/17 0500 05/26/17 1237  BP: (!) 144/66 (!) 103/50 126/60 (!) 118/51  Pulse:  65 70 67  Resp:  18 20 18  Temp:  98.8 F (37.1 C) 98.4 F (36.9 C) 98.7 F (37.1 C)  TempSrc:  Oral Oral Oral  SpO2:  100% 100% 96%  Weight:   126 lb (57.2 kg)   Height:        Intake/Output Summary (Last 24 hours) at 05/26/17 1256 Last data filed at 05/26/17 1236  Gross per 24 hour  Intake              240 ml  Output              750 ml  Net             -510 ml   Filed Weights   05/24/17 0559 05/25/17 0521 05/26/17 0500  Weight: 125 lb 3.2 oz (56.8 kg) 126 lb 8 oz (57.4 kg) 126 lb (57.2 kg)    Telemetry    NSR rare PAC - Personally Reviewed  Physical Exam   GEN: No  acute distress. Thin AAF HEENT: Normocephalic, atraumatic, sclera non-icteric. Neck: No JVD or bruits. Cardiac: RRR no murmurs, rubs, or gallops.  Radials/DP/PT 1+ and equal bilaterally.  Respiratory: Clear to auscultation bilaterally. Breathing is unlabored. GI: Soft, nontender, non-distended, BS +x 4. MS: no deformity. Extremities: No clubbing or cyanosis. No edema. Distal pedal pulses are 2+ and equal bilaterally. Neuro:  AAOx3. Follows commands. Psych:  Responds to questions appropriately with a normal affect.  Labs    Chemistry  Recent Labs Lab 05/21/17 1122 05/22/17 1829 05/23/17 0514 05/24/17 0635  NA 140 141 140 140  K 3.2* 3.4* 3.0* 3.5  CL 106 104 104 108  CO2 26 27 27 26  GLUCOSE 117* 108* 99 93  BUN 13 10 8 15  CREATININE 0.83 0.67 0.57 0.68  CALCIUM 10.1 10.6* 9.7 10.0  PROT 6.3* 7.3  --   --   ALBUMIN   3.1* 3.5  --   --   AST 40 36  --   --   ALT 28 31  --   --   ALKPHOS 50 59  --   --   BILITOT 1.0 1.4*  --   --   GFRNONAA >60 >60 >60 >60  GFRAA >60 >60 >60 >60  ANIONGAP 8 10 9 6      Hematology  Recent Labs Lab 05/22/17 1829 05/23/17 0514 05/24/17 0635  WBC 4.2 4.6 4.9  RBC 3.91 3.61* 3.34*  HGB 11.5* 10.4* 9.6*  HCT 34.9* 32.1* 29.8*  MCV 89.3 88.9 89.2  MCH 29.4 28.8 28.7  MCHC 33.0 32.4 32.2  RDW 14.0 13.9 14.0  PLT 297 290 265    Cardiac Enzymes  Recent Labs Lab 05/23/17 0001 05/23/17 0514 05/23/17 1002  TROPONINI 0.27* 0.25* 0.20*     Recent Labs Lab 05/22/17 1843 05/22/17 2134  TROPIPOC 0.15* 0.12*     BNP  Recent Labs Lab 05/22/17 1829  BNP 900.5*     DDimer   Recent Labs Lab 05/22/17 1829  DDIMER 1.42*     Radiology    Nm Myocar Multi W/spect W/wall Motion / Ef  Result Date: 05/25/2017  There was no ST segment deviation noted during stress.  T wave inversion was noted during stress.  Defect 1: There is a medium defect of moderate severity present in the basal inferior and mid inferior location.   Defect 2: There is a small defect of mild severity present in the apical septal location.  This is an intermediate risk study.  Suboptimal, intermediate risk stress nuclear study with partially reversible inferior defect (infarct vs attenuation artifact) and mild inferior ischemia; mild distal anterior/apical ischemia; cannot R/O transient ischemic LV dilatation; mild LVE; study not gated.    Cardiac Studies   TTE: 05/23/17 - Mild LVH with LVEF approximately 55%. Mild hypokinesis of the basal inferior wall. Grade 2 diastolic dysfunction. Severe left atrial enlargement. Mildly thickened mitral leaflets with mild to moderate mitral regurgitation. Mild tricuspid regurgitation with PASP estimated 46 mmHg.  Patient Profile     71 y.o. female with HTN, HLD, subacute thyroiditis, recent GI bleed and CAD with coronary spasmwho is being seen today for the evaluation of chest pain and elevated tropat the request of Dr. Lorretta HarpXilin Niu  Assessment & Plan    1. NSTEMI:  Troponin now max 0.27. Echo showed LVEF 55%, possible hypokinesis in 1 segment. Baseline EKG abnormal. Abnormal stress test - personally reviewed, small ischemia in the apical anterior and moderate ischemia in the basal and mid inferior walls. LHC later today. She agrees.  She has mild anemia, but negative FOBT.  2. Subacute thyroiditis with hyperthyroidism: Free T3 12.8 fivedays ago, T4 also elevated with TSH low. Likely also contributed to her losing 20 pounds recently. Being managed by IM.  3. Possible UTI: Receiving IV Rocephin per IM.  4. History of nonobstructive CAD with coronary spasm: Noted to have diffuse moderate disease in 2003. Await scan results.  5. Hypertension - generally controlled down in nuc med.  6. Hyperlipidemia - on statin.  7. Hypokalemia - consider low dose standing repletion if K falls subtherapeutic.  8. Possible GI bleed: FOBT negative but patient reported blood in the stool for the past 2  months. Management per primary team. Hgb 9-10 range recently.  Tobias AlexanderKatarina Tamara Monteith, MD 05/26/2017

## 2017-05-26 NOTE — Progress Notes (Signed)
Progress Note  Patient Name: [name] Date of Encounter: 05/26/2017  Primary Cardiologist: Dr. Katrinka BlazingSmith  Subjective   She has mild left sided discomfort.  Inpatient Medications    Scheduled Meds: . amLODipine  10 mg Oral Daily  . aspirin  81 mg Oral Once  . atenolol  50 mg Oral BID  . atorvastatin  20 mg Oral Daily  . cefdinir  250 mg Oral BID  . enoxaparin (LOVENOX) injection  40 mg Subcutaneous Q24H  . ezetimibe  10 mg Oral Daily  . feeding supplement (ENSURE ENLIVE)  237 mL Oral BID BM  . ferrous sulfate  325 mg Oral Daily  . hydrocortisone  25 mg Rectal BID  . isosorbide mononitrate  120 mg Oral Daily  . lisinopril  20 mg Oral Daily  . oxybutynin  10 mg Oral Daily  . pantoprazole  40 mg Oral BID  . predniSONE  40 mg Oral Q breakfast  . sodium chloride flush  3 mL Intravenous Q12H  . venlafaxine XR  150 mg Oral QHS  . vitamin B-12  100 mcg Oral QODAY  . vitamin C  500 mg Oral Daily  . vitamin E  1,000 Units Oral QODAY   Continuous Infusions: . sodium chloride    . sodium chloride 1 mL/kg/hr (05/26/17 0700)   PRN Meds: sodium chloride, acetaminophen, hydrALAZINE, morphine injection, ondansetron, sodium chloride flush, traMADol, zolpidem   Vital Signs    Vitals:   05/25/17 1028 05/25/17 2100 05/26/17 0500 05/26/17 1237  BP: (!) 144/66 (!) 103/50 126/60 (!) 118/51  Pulse:  65 70 67  Resp:  18 20 18   Temp:  98.8 F (37.1 C) 98.4 F (36.9 C) 98.7 F (37.1 C)  TempSrc:  Oral Oral Oral  SpO2:  100% 100% 96%  Weight:   126 lb (57.2 kg)   Height:        Intake/Output Summary (Last 24 hours) at 05/26/17 1256 Last data filed at 05/26/17 1236  Gross per 24 hour  Intake              240 ml  Output              750 ml  Net             -510 ml   Filed Weights   05/24/17 0559 05/25/17 0521 05/26/17 0500  Weight: 125 lb 3.2 oz (56.8 kg) 126 lb 8 oz (57.4 kg) 126 lb (57.2 kg)    Telemetry    NSR rare PAC - Personally Reviewed  Physical Exam   GEN: No  acute distress. Thin AAF HEENT: Normocephalic, atraumatic, sclera non-icteric. Neck: No JVD or bruits. Cardiac: RRR no murmurs, rubs, or gallops.  Radials/DP/PT 1+ and equal bilaterally.  Respiratory: Clear to auscultation bilaterally. Breathing is unlabored. GI: Soft, nontender, non-distended, BS +x 4. MS: no deformity. Extremities: No clubbing or cyanosis. No edema. Distal pedal pulses are 2+ and equal bilaterally. Neuro:  AAOx3. Follows commands. Psych:  Responds to questions appropriately with a normal affect.  Labs    Chemistry  Recent Labs Lab 05/21/17 1122 05/22/17 1829 05/23/17 0514 05/24/17 0635  NA 140 141 140 140  K 3.2* 3.4* 3.0* 3.5  CL 106 104 104 108  CO2 26 27 27 26   GLUCOSE 117* 108* 99 93  BUN 13 10 8 15   CREATININE 0.83 0.67 0.57 0.68  CALCIUM 10.1 10.6* 9.7 10.0  PROT 6.3* 7.3  --   --   ALBUMIN  3.1* 3.5  --   --   AST 40 36  --   --   ALT 28 31  --   --   ALKPHOS 50 59  --   --   BILITOT 1.0 1.4*  --   --   GFRNONAA >60 >60 >60 >60  GFRAA >60 >60 >60 >60  ANIONGAP 8 10 9 6      Hematology  Recent Labs Lab 05/22/17 1829 05/23/17 0514 05/24/17 0635  WBC 4.2 4.6 4.9  RBC 3.91 3.61* 3.34*  HGB 11.5* 10.4* 9.6*  HCT 34.9* 32.1* 29.8*  MCV 89.3 88.9 89.2  MCH 29.4 28.8 28.7  MCHC 33.0 32.4 32.2  RDW 14.0 13.9 14.0  PLT 297 290 265    Cardiac Enzymes  Recent Labs Lab 05/23/17 0001 05/23/17 0514 05/23/17 1002  TROPONINI 0.27* 0.25* 0.20*     Recent Labs Lab 05/22/17 1843 05/22/17 2134  TROPIPOC 0.15* 0.12*     BNP  Recent Labs Lab 05/22/17 1829  BNP 900.5*     DDimer   Recent Labs Lab 05/22/17 1829  DDIMER 1.42*     Radiology    Nm Myocar Multi W/spect W/wall Motion / Ef  Result Date: 05/25/2017  There was no ST segment deviation noted during stress.  T wave inversion was noted during stress.  Defect 1: There is a medium defect of moderate severity present in the basal inferior and mid inferior location.   Defect 2: There is a small defect of mild severity present in the apical septal location.  This is an intermediate risk study.  Suboptimal, intermediate risk stress nuclear study with partially reversible inferior defect (infarct vs attenuation artifact) and mild inferior ischemia; mild distal anterior/apical ischemia; cannot R/O transient ischemic LV dilatation; mild LVE; study not gated.    Cardiac Studies   TTE: 05/23/17 - Mild LVH with LVEF approximately 55%. Mild hypokinesis of the basal inferior wall. Grade 2 diastolic dysfunction. Severe left atrial enlargement. Mildly thickened mitral leaflets with mild to moderate mitral regurgitation. Mild tricuspid regurgitation with PASP estimated 46 mmHg.  Patient Profile     71 y.o. female with HTN, HLD, subacute thyroiditis, recent GI bleed and CAD with coronary spasmwho is being seen today for the evaluation of chest pain and elevated tropat the request of Dr. Lorretta HarpXilin Niu  Assessment & Plan    1. NSTEMI:  Troponin now max 0.27. Echo showed LVEF 55%, possible hypokinesis in 1 segment. Baseline EKG abnormal. Abnormal stress test - personally reviewed, small ischemia in the apical anterior and moderate ischemia in the basal and mid inferior walls. LHC later today. She agrees.  She has mild anemia, but negative FOBT.  2. Subacute thyroiditis with hyperthyroidism: Free T3 12.8 fivedays ago, T4 also elevated with TSH low. Likely also contributed to her losing 20 pounds recently. Being managed by IM.  3. Possible UTI: Receiving IV Rocephin per IM.  4. History of nonobstructive CAD with coronary spasm: Noted to have diffuse moderate disease in 2003. Await scan results.  5. Hypertension - generally controlled down in nuc med.  6. Hyperlipidemia - on statin.  7. Hypokalemia - consider low dose standing repletion if K falls subtherapeutic.  8. Possible GI bleed: FOBT negative but patient reported blood in the stool for the past 2  months. Management per primary team. Hgb 9-10 range recently.  Katherine AlexanderKatarina Torrey Ballinas, MD 05/26/2017

## 2017-05-26 NOTE — Progress Notes (Addendum)
PROGRESS NOTE    [name]  ZOX:[id number] DOB: 01-08-1946 DOA: 05/22/2017 PCP: Shelva Majestic, MD   [facility] a 71 y.o.femalewith medical history significant of hypertension, hyperlipidemia, GERD, diverticulitis, CAD, stent placement, back pain, PVC, GI bleeding, who presents with generalized weakness, SOB, chest discomfort, neck pain, sore throat, dysuria, suprapubic abdominal pain.  Patient states that she has not been feeling well in the past 3 mouth. She has multiple complaints, including generalized weakness, body aches, shortness of breath, chest discomfort, neck pain, sore throat, dysuria, suprapubic abdominal pain.  Patient states that she has shortness of breath intermittently, and mild chest discomfort sometimes. She has dry cough, no fever or chills. She has body aches, generalized weakness. Recently she has front neck pain, sore throat, but no runny nose. She has nausea, no vomiting or diarrhea. She has suprapubic abdominal pain, which is 2 out of 10 in severity, intermittent, nonradiating. She has intermittent dysuria, burning on urination and increased urinary frequency. She also has bilateral shoulder pain, but no swelling or redness in shoulders. No unilateral weakness. No hematochezia, hematemesis or hematuria. She states that she lost 20 pounds in the past 3 mouth. She reports that she was seen yesterday and was told that she has thyroid problems and possibly a GI bleed. She was told to follow-up with 2 different specialist, GI and endocrine doctor. Her TSH was<0.01 yesterday. Her free T3 was 12.8 and T4 3.64 on 05/18/17. Patient reports dark stool twice.   ED Course:pt was found to have troponin 0.12-->0.27, negative FOBT, lactic acid 2.018, 122, INR 1.3, BNP 100.5, lipase 33, potassium 3.4, creatinine normal, urinalysis with small amount of leukocyte and many bacteria's, temperature normal, tachycardia, tachypnea, oxygen saturation 100% on room air, chest x-rays  negative. CT angiogram of chest is negative for PE. Patient is placed on telemetry bed for observation.  HOSPITAL COURSE:   # NSTEMI:Troponin abnormal,0.27>0.25>0.20 -Patient presented with chest pain or shortness of breath and found to have serum troponin level of 0.27. Started on IV heparin on admission. -Patient denied chest pain  . Evaluated by cardiologist. Dr. Diona Browner. He recommended stoppingIV heparin and obtaining echocardiogram.Echocardiogram shows Mild hypokinesis of the basal inferior wall -Continue aspirin, Lipitor, beta blocker, Imdur, lisinopril Venous Doppler negative for DVT CT chest negative for PE As per Dr. Delton See, patient underwent Lexiscan nuclear stress  which was abnormal , small ischemia in the apical anterior and moderate ischemia in the basal and mid inferior walls.    LHC shows nonobstructive CAD . Marland Kitchen  #Possible subacute thyroiditis:Patient has thyroid gland tenderness. -Patient with severely suppressed TSH  <0.01 associated with elevated T3 12.8 and T4 level 3.64  on July 24. Patient is on atenolol. Ultrasound of neck consistent with heterogeneous hyperemic thyroid without nodules. Patient will need to follow up with endocrinologist outpatient on an urgent basis. May benefit from methimazole,    # Possible acute cystitis without hematuria: Discontinued IV ceftriaxone. Change to Omnicef 2 more days  #GERD: Continue Protonix  #Hypertension: Continue to monitor blood pressure. Continue current cardiac medications.  #Hyperlipidemia: Continue Lipitor and Zetia  #Severe hypokalemia and hypomagnesemia: Repleted IV and oral potassium chloride  #Recent history of GI bleed: as per H&P Fecal occult blood test negative. Continue Protonix.  #Moderate protein calorie malnutrition: Continue nutrition consult  #Generalized weakness likely contributed by thyroid disease and comorbidities: Patient will be discharged with home health PT/OT    DVT  prophylaxis: Lovenox subcutaneous Code Status: Full code Family Communication:  Discussed with  the patient's son in detail via telephone  Disposition Plan: Likely discharge in am     Consultants:   Cardiology  Procedures: Pending echo Antimicrobials: IV ceftriaxone  Subjective:  Patient laying comfortably in bed, denies any chest pain or shortness of breath  Objective: Vitals:   05/25/17 1026 05/25/17 1028 05/25/17 2100 05/26/17 0500  BP: 132/66 (!) 144/66 (!) 103/50 126/60  Pulse:   65 70  Resp:   18 20  Temp:   98.8 F (37.1 C) 98.4 F (36.9 C)  TempSrc:   Oral Oral  SpO2:   100% 100%  Weight:    57.2 kg (126 lb)  Height:        Intake/Output Summary (Last 24 hours) at 05/26/17 1010 Last data filed at 05/26/17 0910  Gross per 24 hour  Intake              240 ml  Output              600 ml  Net             -360 ml   Filed Weights   05/24/17 0559 05/25/17 0521 05/26/17 0500  Weight: 56.8 kg (125 lb 3.2 oz) 57.4 kg (126 lb 8 oz) 57.2 kg (126 lb)    Examination:  General exam: Appears calm and comfortable  Respiratory system: Clear to auscultation. Respiratory effort normal. No wheezing or crackle Cardiovascular system: S1 & S2 heard, RRR.  No pedal edema. Gastrointestinal system: Abdomen is nondistended, soft and nontender. Normal bowel sounds heard. Central nervous system: Alert and oriented. No focal neurological deficits. Extremities: Symmetric 5 x 5 power. Skin: No rashes, lesions or ulcers Psychiatry: Judgement and insight appear normal. Mood & affect appropriate.     Data Reviewed: I have personally reviewed following labs and imaging studies  CBC:  Recent Labs Lab 05/21/17 1122 05/22/17 1829 05/23/17 0514 05/24/17 0635  WBC 3.6* 4.2 4.6 4.9  NEUTROABS  --  2.2  --   --   HGB 9.9* 11.5* 10.4* 9.6*  HCT 30.3* 34.9* 32.1* 29.8*  MCV 89.6 89.3 88.9 89.2  PLT 276 297 290 265   Basic Metabolic Panel:  Recent Labs Lab 05/21/17 1122  05/22/17 1829 05/23/17 0514 05/23/17 1002 05/24/17 0635  NA 140 141 140  --  140  K 3.2* 3.4* 3.0*  --  3.5  CL 106 104 104  --  108  CO2 26 27 27   --  26  GLUCOSE 117* 108* 99  --  93  BUN 13 10 8   --  15  CREATININE 0.83 0.67 0.57  --  0.68  CALCIUM 10.1 10.6* 9.7  --  10.0  MG  --  1.5*  --  1.8 1.9   GFR: Estimated Creatinine Clearance: 59.1 mL/min (by C-G formula based on SCr of 0.68 mg/dL). Liver Function Tests:  Recent Labs Lab 05/21/17 1122 05/22/17 1829  AST 40 36  ALT 28 31  ALKPHOS 50 59  BILITOT 1.0 1.4*  PROT 6.3* 7.3  ALBUMIN 3.1* 3.5    Recent Labs Lab 05/22/17 1829  LIPASE 33   No results for input(s): AMMONIA in the last 168 hours. Coagulation Profile:  Recent Labs Lab 05/22/17 1829 05/26/17 0514  INR 1.30 1.21   Cardiac Enzymes:  Recent Labs Lab 05/23/17 0001 05/23/17 0514 05/23/17 1002  TROPONINI 0.27* 0.25* 0.20*   BNP (last 3 results)  Recent Labs  05/10/17 1324  PROBNP 289.0*   HbA1C: No results  for input(s): HGBA1C in the last 72 hours. CBG:  Recent Labs Lab 05/24/17 0558 05/25/17 0643 05/26/17 0524  GLUCAP 110* 105* 100*   Lipid Profile: No results for input(s): CHOL, HDL, LDLCALC, TRIG, CHOLHDL, LDLDIRECT in the last 72 hours. Thyroid Function Tests: No results for input(s): TSH, T4TOTAL, FREET4, T3FREE, THYROIDAB in the last 72 hours. Anemia Panel: No results for input(s): VITAMINB12, FOLATE, FERRITIN, TIBC, IRON, RETICCTPCT in the last 72 hours. Sepsis Labs:  Recent Labs Lab 05/22/17 1845 05/22/17 2135  LATICACIDVEN 2.18* 1.20    Recent Results (from the past 240 hour(s))  Urine culture     Status: Abnormal   Collection Time: 05/22/17  6:29 PM  Result Value Ref Range Status   Specimen Description URINE, RANDOM  Final   Special Requests NONE  Final   Culture >=100,000 COLONIES/mL ESCHERICHIA COLI (A)  Final   Report Status 05/24/2017 FINAL  Final   Organism ID, Bacteria ESCHERICHIA COLI (A)  Final       Susceptibility   Escherichia coli - MIC*    AMPICILLIN <=2 SENSITIVE Sensitive     CEFAZOLIN <=4 SENSITIVE Sensitive     CEFTRIAXONE <=1 SENSITIVE Sensitive     CIPROFLOXACIN <=0.25 SENSITIVE Sensitive     GENTAMICIN <=1 SENSITIVE Sensitive     IMIPENEM <=0.25 SENSITIVE Sensitive     NITROFURANTOIN <=16 SENSITIVE Sensitive     TRIMETH/SULFA <=20 SENSITIVE Sensitive     AMPICILLIN/SULBACTAM <=2 SENSITIVE Sensitive     PIP/TAZO <=4 SENSITIVE Sensitive     Extended ESBL NEGATIVE Sensitive     * >=100,000 COLONIES/mL ESCHERICHIA COLI  Culture, blood (Routine X 2) w Reflex to ID Panel     Status: None (Preliminary result)   Collection Time: 05/22/17  6:39 PM  Result Value Ref Range Status   Specimen Description BLOOD RIGHT ANTECUBITAL  Final   Special Requests IN PEDIATRIC BOTTLE Blood Culture adequate volume  Final   Culture NO GROWTH 2 DAYS  Final   Report Status PENDING  Incomplete  Culture, blood (Routine X 2) w Reflex to ID Panel     Status: None (Preliminary result)   Collection Time: 05/23/17 12:05 AM  Result Value Ref Range Status   Specimen Description BLOOD RIGHT HAND  Final   Special Requests   Final    BOTTLES DRAWN AEROBIC ONLY Blood Culture adequate volume   Culture NO GROWTH 2 DAYS  Final   Report Status PENDING  Incomplete  Rapid strep screen ([facility])     Status: None   Collection Time: 05/23/17  2:30 AM  Result Value Ref Range Status   Streptococcus, Group A Screen (Direct) NEGATIVE NEGATIVE Final    Comment: (NOTE) A Rapid Antigen test may result negative if the antigen level in the sample is below the detection level of this test. The FDA has not cleared this test as a stand-alone test therefore the rapid antigen negative result has reflexed to a Group A Strep culture.   Culture, group A strep     Status: None   Collection Time: 05/23/17  2:30 AM  Result Value Ref Range Status   Specimen Description THROAT  Final   Special Requests NONE Reflexed  from [id number]  Final   Culture NO GROUP A STREP (S.PYOGENES) ISOLATED  Final   Report Status 05/25/2017 FINAL  Final         Radiology Studies: Nm Myocar Multi W/spect W/wall Motion / Ef  Result Date: 05/25/2017  There was no  ST segment deviation noted during stress.  T wave inversion was noted during stress.  Defect 1: There is a medium defect of moderate severity present in the basal inferior and mid inferior location.  Defect 2: There is a small defect of mild severity present in the apical septal location.  This is an intermediate risk study.  Suboptimal, intermediate risk stress nuclear study with partially reversible inferior defect (infarct vs attenuation artifact) and mild inferior ischemia; mild distal anterior/apical ischemia; cannot R/O transient ischemic LV dilatation; mild LVE; study not gated.        Scheduled Meds: . amLODipine  10 mg Oral Daily  . aspirin  81 mg Oral Once  . atenolol  50 mg Oral BID  . atorvastatin  20 mg Oral Daily  . cefdinir  250 mg Oral BID  . enoxaparin (LOVENOX) injection  40 mg Subcutaneous Q24H  . ezetimibe  10 mg Oral Daily  . feeding supplement (ENSURE ENLIVE)  237 mL Oral BID BM  . ferrous sulfate  325 mg Oral Daily  . hydrocortisone  25 mg Rectal BID  . isosorbide mononitrate  120 mg Oral Daily  . lisinopril  20 mg Oral Daily  . oxybutynin  10 mg Oral Daily  . pantoprazole  40 mg Oral BID  . predniSONE  40 mg Oral Q breakfast  . sodium chloride flush  3 mL Intravenous Q12H  . venlafaxine XR  150 mg Oral QHS  . vitamin B-12  100 mcg Oral QODAY  . vitamin C  500 mg Oral Daily  . vitamin E  1,000 Units Oral QODAY   Continuous Infusions: . sodium chloride    . sodium chloride 1 mL/kg/hr (05/26/17 0700)     LOS: 3 days    Richarda OverlieNayana Janyiah Silveri, MD [facility] Pager [phone]  If 7PM-7AM, please contact night-coverage [email or web address] Password [email or web address] 05/26/2017, 10:10 AM

## 2017-05-27 ENCOUNTER — Encounter (HOSPITAL_COMMUNITY): Payer: Self-pay | Admitting: Interventional Cardiology

## 2017-05-27 ENCOUNTER — Telehealth: Payer: Self-pay

## 2017-05-27 LAB — BASIC METABOLIC PANEL
ANION GAP: 6 (ref 5–15)
BUN: 14 mg/dL (ref 6–20)
CO2: 26 mmol/L (ref 22–32)
Calcium: 9.6 mg/dL (ref 8.9–10.3)
Chloride: 109 mmol/L (ref 101–111)
Creatinine, Ser: 0.7 mg/dL (ref 0.44–1.00)
Glucose, Bld: 86 mg/dL (ref 65–99)
POTASSIUM: 3.9 mmol/L (ref 3.5–5.1)
Sodium: 141 mmol/L (ref 135–145)

## 2017-05-27 LAB — GLUCOSE, CAPILLARY: Glucose-Capillary: 87 mg/dL (ref 65–99)

## 2017-05-27 MED ORDER — ACETAMINOPHEN 325 MG PO TABS: 650.0000 mg | ORAL_TABLET | Freq: Four times a day (QID) | ORAL | Status: DC | PRN

## 2017-05-27 MED ORDER — ATENOLOL 50 MG PO TABS: 50.0000 mg | ORAL_TABLET | Freq: Every day | ORAL | 2 refills | Status: DC

## 2017-05-27 MED ORDER — TRAMADOL HCL 50 MG PO TABS: 50.0000 mg | ORAL_TABLET | Freq: Four times a day (QID) | ORAL | 0 refills | Status: DC | PRN

## 2017-05-27 NOTE — Progress Notes (Signed)
Occupational Therapy Treatment Patient Details Name: Katherine ShamsCary Lamons MRN: [id number] DOB: 01/30/1946 Today's Date: 05/27/2017    History of present illness 71 y.o. female with medical history significant of hypertension, hyperlipidemia, GERD, diverticulitis, CAD, stent placement, back pain, PVC, GI bleeding, who presents with generalized weakness, SOB, chest discomfort, neck pain, sore throat, dysuria, suprapubic abdominal pain. NSTEMI; possible cystitis   OT comments  Pt able to perform functional mobility in room including toilet and simulated tub transfer with min guard assist. Educated on use of 3 in 1, fall prevention, and energy conservation for home. D/c plan remains appropriate. Will continue to follow acutely.   Follow Up Recommendations  Home health OT;Supervision/Assistance - 24 hour    Equipment Recommendations  3 in 1 bedside commode;Other (comment) (RW)    Recommendations for Other Services      Precautions / Restrictions Precautions Precautions: Fall Restrictions Weight Bearing Restrictions: No       Mobility Bed Mobility Overal bed mobility: Modified Independent                Transfers Overall transfer level: Needs assistance Equipment used: None Transfers: Sit to/from Stand Sit to Stand: Min guard         General transfer comment: for balance    Balance Overall balance assessment: Needs assistance Sitting-balance support: Feet supported;No upper extremity supported Sitting balance-Leahy Scale: Normal     Standing balance support: No upper extremity supported;During functional activity Standing balance-Leahy Scale: Good                             ADL either performed or assessed with clinical judgement   ADL Overall ADL's : Needs assistance/impaired                     Lower Body Dressing: Min guard;Sit to/from stand Lower Body Dressing Details (indicate cue type and reason): Pt able to adjust socks sitting EOB Toilet  Transfer: Min guard;Ambulation;Regular Toilet       Tub/ Engineer, structuralhower Transfer: Min guard;Tub transfer;Ambulation;3 in 1 Tub/Shower Transfer Details (indicate cue type and reason): Educated on use of 3 in 1 in tub as a seat Functional mobility during ADLs: Min guard General ADL Comments: Educated pt on fall prevention and energy conservation strategies for home; pt verbalized understanding.     Vision       Perception     Praxis      Cognition Arousal/Alertness: Awake/alert Behavior During Therapy: WFL for tasks assessed/performed Overall Cognitive Status: Within Functional Limits for tasks assessed                                          Exercises     Shoulder Instructions       General Comments      Pertinent Vitals/ Pain       Pain Assessment: No/denies pain  Home Living                                          Prior Functioning/Environment              Frequency  Min 2X/week        Progress Toward Goals  OT Goals(current goals can now be found in the  care plan section)  Progress towards OT goals: Progressing toward goals  Acute Rehab OT Goals Patient Stated Goal: to be independent OT Goal Formulation: With patient  Plan Discharge plan remains appropriate    Co-evaluation                 AM-PAC PT "6 Clicks" Daily Activity     Outcome Measure   Help from another person eating meals?: None Help from another person taking care of personal grooming?: None Help from another person toileting, which includes using toliet, bedpan, or urinal?: A Little Help from another person bathing (including washing, rinsing, drying)?: A Little Help from another person to put on and taking off regular upper body clothing?: None Help from another person to put on and taking off regular lower body clothing?: A Little 6 Click Score: 21    End of Session    OT Visit Diagnosis: Unsteadiness on feet (R26.81);Muscle weakness  (generalized) (M62.81);Pain   Activity Tolerance Patient tolerated treatment well   Patient Left in bed;with call bell/phone within reach   Nurse Communication          Time: [id number] OT Time Calculation (min): 9 min  Charges: OT General Charges $OT Visit: 1 Procedure OT Treatments $Self Care/Home Management : 8-22 mins  Micki Cassel A. Brett Albinooffey, M.S., OTR/L Pager: [phone]   Gaye AlkenBailey A Roselina Burgueno 05/27/2017, 2:13 PM

## 2017-05-27 NOTE — Progress Notes (Addendum)
Physical Therapy Treatment Patient Details Name: Katherine ShamsCary Reynolds MRN: [id number] DOB: 07/12/1946 Today's Date: 05/27/2017    History of Present Illness 71 y.o. female with medical history significant of hypertension, hyperlipidemia, GERD, diverticulitis, CAD, stent placement, back pain, PVC, GI bleeding, who presents with generalized weakness, SOB, chest discomfort, neck pain, sore throat, dysuria, suprapubic abdominal pain. NSTEMI; possible cystitis    PT Comments    Patient tolerated gait training well and able to ambulate farther this am. Pt also tolerated seated bilat LE therex. Pt does continue to demonstrated decreased strength and activity tolerance.  Continue to progress as tolerated with anticipated d/c home with HHPT.   Follow Up Recommendations  Home health PT     Equipment Recommendations  Rolling walker with 5" wheels    Recommendations for Other Services OT consult     Precautions / Restrictions Precautions Precautions: Fall Restrictions Weight Bearing Restrictions: No    Mobility  Bed Mobility Overal bed mobility: Modified Independent                Transfers Overall transfer level: Needs assistance Equipment used: Rolling walker (2 wheeled);None Transfers: Sit to/from Stand Sit to Stand: Supervision         General transfer comment: pt unsteady initially upon standing without AD and sat back down on EOB; second trial pt given RW and upon standing used bilat UE support for balance; supervision for safety  Ambulation/Gait Ambulation/Gait assistance: Supervision;Min guard Ambulation Distance (Feet): 140 Feet Assistive device: Rolling walker (2 wheeled);None Gait Pattern/deviations: Step-through pattern;Decreased step length - right;Decreased step length - left;Narrow base of support Gait velocity: slowed   General Gait Details: cues for increased stride length and posture; pt educated on safe use of RW to prevent falls; pt with short bilat step lengths  and narrow BOS with turns pt has more narrow BOS but not to the point of scissoring; increased bilat LE weakness noted last 5450ft   Stairs         General stair comments: did not practice step but pt educated in safest way to manage single step with RW  Wheelchair Mobility    Modified Rankin (Stroke Patients Only)       Balance Overall balance assessment: Needs assistance Sitting-balance support: No upper extremity supported;Feet supported Sitting balance-Leahy Scale: Normal     Standing balance support: No upper extremity supported Standing balance-Leahy Scale: Good                              Cognition Arousal/Alertness: Awake/alert Behavior During Therapy: WFL for tasks assessed/performed Overall Cognitive Status: Within Functional Limits for tasks assessed                                        Exercises General Exercises - Lower Extremity Long Arc Quad: AROM;Both;20 reps;Other (comment);Seated (with 5 seconds holds in extension) Hip Flexion/Marching: AROM;Both;20 reps;Seated Toe Raises: AROM;Both;20 reps Heel Raises: AROM;Both;20 reps    General Comments        Pertinent Vitals/Pain Pain Assessment: No/denies pain    Home Living                      Prior Function            PT Goals (current goals can now be found in the care plan section) Acute Rehab PT  Goals Patient Stated Goal: to be independent PT Goal Formulation: With patient Time For Goal Achievement: 06/06/17 Potential to Achieve Goals: Good Progress towards PT goals: Progressing toward goals    Frequency    Min 3X/week      PT Plan Current plan remains appropriate    Co-evaluation              AM-PAC PT "6 Clicks" Daily Activity  Outcome Measure  Difficulty turning over in bed (including adjusting bedclothes, sheets and blankets)?: None Difficulty moving from lying on back to sitting on the side of the bed? : None Difficulty  sitting down on and standing up from a chair with arms (e.g., wheelchair, bedside commode, etc,.)?: None Help needed moving to and from a bed to chair (including a wheelchair)?: None Help needed walking in hospital room?: A Little Help needed climbing 3-5 steps with a railing? : A Lot 6 Click Score: 21    End of Session Equipment Utilized During Treatment: Gait belt Activity Tolerance: Patient tolerated treatment well Patient left: with call bell/phone within reach;in bed Nurse Communication: Mobility status PT Visit Diagnosis: Muscle weakness (generalized) (M62.81)     Time: [id number] PT Time Calculation (min) (ACUTE ONLY): 14 min  Charges:  $Gait Training: 8-22 mins                    G Codes:       [name], PTA Pager: [phone]     [name] 05/27/2017, 10:20 AM

## 2017-05-27 NOTE — Care Management Important Message (Signed)
Important Message  Patient Details  Name: Sherlene ShamsCary Coletti MRN: [id number] Date of Birth: 08/22/1946   Medicare Important Message Given:  Yes    Kyla BalzarineShealy, Maia Handa Abena 05/27/2017, 10:11 AM

## 2017-05-27 NOTE — Telephone Encounter (Signed)
Spoke with [facility]ound[facility] regarding Endocrine referral. They called the patient but she was still in the hospital. She told them she would call them back to schedule. Patient was discharged 05/25/17

## 2017-05-27 NOTE — Progress Notes (Signed)
Progress Note  Patient Name: [name] Date of Encounter: 05/27/2017  Primary Cardiologist: Dr. Katrinka BlazingSmith  Subjective   She denies CP or SOB, asked for a tylenol for headache.  Inpatient Medications    Scheduled Meds: . amLODipine  10 mg Oral Daily  . aspirin  81 mg Oral Once  . atenolol  50 mg Oral BID  . atorvastatin  20 mg Oral Daily  . enoxaparin (LOVENOX) injection  40 mg Subcutaneous Q24H  . ezetimibe  10 mg Oral Daily  . feeding supplement (ENSURE ENLIVE)  237 mL Oral BID BM  . ferrous sulfate  325 mg Oral Daily  . hydrocortisone  25 mg Rectal BID  . isosorbide mononitrate  120 mg Oral Daily  . lisinopril  20 mg Oral Daily  . oxybutynin  10 mg Oral Daily  . pantoprazole  40 mg Oral BID  . predniSONE  40 mg Oral Q breakfast  . sodium chloride flush  3 mL Intravenous Q12H  . venlafaxine XR  150 mg Oral QHS  . vitamin B-12  100 mcg Oral QODAY  . vitamin C  500 mg Oral Daily  . vitamin E  1,000 Units Oral QODAY   Continuous Infusions: . sodium chloride     PRN Meds: sodium chloride, acetaminophen, hydrALAZINE, morphine injection, nitroGLYCERIN, ondansetron (ZOFRAN) IV, sodium chloride flush, traMADol, zolpidem   Vital Signs    Vitals:   05/26/17 1714 05/26/17 1748 05/26/17 1950 05/27/17 0343  BP: 139/75 (!) 135/57 (!) 116/57 122/65  Pulse: (!) 0 68 67 61  Resp: 15 18 18 18   Temp:  98.5 F (36.9 C) 98.7 F (37.1 C) 98.1 F (36.7 C)  TempSrc:  Oral Oral Oral  SpO2: (!) 0% 100% 95% 98%  Weight:    124 lb 9 oz (56.5 kg)  Height:        Intake/Output Summary (Last 24 hours) at 05/27/17 1037 Last data filed at 05/26/17 2225  Gross per 24 hour  Intake              240 ml  Output              450 ml  Net             -210 ml   Filed Weights   05/25/17 0521 05/26/17 0500 05/27/17 0343  Weight: 126 lb 8 oz (57.4 kg) 126 lb (57.2 kg) 124 lb 9 oz (56.5 kg)    Telemetry    NSR rare PAC - Personally Reviewed  Physical Exam   GEN: No acute distress. Thin  AAF HEENT: Normocephalic, atraumatic, sclera non-icteric. Neck: No JVD or bruits. Cardiac: RRR no murmurs, rubs, or gallops.  Radials/DP/PT 1+ and equal bilaterally.  Respiratory: Clear to auscultation bilaterally. Breathing is unlabored. GI: Soft, nontender, non-distended, BS +x 4. MS: no deformity. Extremities: No clubbing or cyanosis. No edema. Distal pedal pulses are 2+ and equal bilaterally. Neuro:  AAOx3. Follows commands. Psych:  Responds to questions appropriately with a normal affect.  Labs    Chemistry  Recent Labs Lab 05/21/17 1122 05/22/17 1829 05/23/17 0514 05/24/17 0635 05/27/17 0328  NA 140 141 140 140 141  K 3.2* 3.4* 3.0* 3.5 3.9  CL 106 104 104 108 109  CO2 26 27 27 26 26   GLUCOSE 117* 108* 99 93 86  BUN 13 10 8 15 14   CREATININE 0.83 0.67 0.57 0.68 0.70  CALCIUM 10.1 10.6* 9.7 10.0 9.6  PROT 6.3* 7.3  --   --   --  ALBUMIN 3.1* 3.5  --   --   --   AST 40 36  --   --   --   ALT 28 31  --   --   --   ALKPHOS 50 59  --   --   --   BILITOT 1.0 1.4*  --   --   --   GFRNONAA >60 >60 >60 >60 >60  GFRAA >60 >60 >60 >60 >60  ANIONGAP 8 10 9 6 6      Hematology  Recent Labs Lab 05/22/17 1829 05/23/17 0514 05/24/17 0635  WBC 4.2 4.6 4.9  RBC 3.91 3.61* 3.34*  HGB 11.5* 10.4* 9.6*  HCT 34.9* 32.1* 29.8*  MCV 89.3 88.9 89.2  MCH 29.4 28.8 28.7  MCHC 33.0 32.4 32.2  RDW 14.0 13.9 14.0  PLT 297 290 265    Cardiac Enzymes  Recent Labs Lab 05/23/17 0001 05/23/17 0514 05/23/17 1002  TROPONINI 0.27* 0.25* 0.20*     Recent Labs Lab 05/22/17 1843 05/22/17 2134  TROPIPOC 0.15* 0.12*     BNP  Recent Labs Lab 05/22/17 1829  BNP 900.5*     DDimer   Recent Labs Lab 05/22/17 1829  DDIMER 1.42*     Radiology    Nm Myocar Multi W/spect W/wall Motion / Ef  Result Date: 05/25/2017  There was no ST segment deviation noted during stress.  T wave inversion was noted during stress.  Defect 1: There is a medium defect of moderate  severity present in the basal inferior and mid inferior location.  Defect 2: There is a small defect of mild severity present in the apical septal location.  This is an intermediate risk study.  Suboptimal, intermediate risk stress nuclear study with partially reversible inferior defect (infarct vs attenuation artifact) and mild inferior ischemia; mild distal anterior/apical ischemia; cannot R/O transient ischemic LV dilatation; mild LVE; study not gated.    Cardiac Studies   TTE: 05/23/17 - Mild LVH with LVEF approximately 55%. Mild hypokinesis of the basal inferior wall. Grade 2 diastolic dysfunction. Severe left atrial enlargement. Mildly thickened mitral leaflets with mild to moderate mitral regurgitation. Mild tricuspid regurgitation with PASP estimated 46 mmHg.  Patient Profile     71 y.o. female with HTN, HLD, subacute thyroiditis, recent GI bleed and CAD with coronary spasmwho is being seen today for the evaluation of chest pain and elevated tropat the request of Dr. Lorretta HarpXilin Niu  Assessment & Plan    1. NSTEMI:  Troponin now max 0.27. Echo showed LVEF 55%, possible hypokinesis in 1 segment. Baseline EKG abnormal. LHC yesterday showed mild nonobstructive CAD, elevated troponin sec to demand ischemia, She has mild anemia, but negative FOBT.  2. Subacute thyroiditis with hyperthyroidism: Free T3 12.8 fivedays ago, T4 also elevated with TSH low. Likely also contributed to her losing 20 pounds recently. Being managed by IM.  3. Possible UTI: Receiving IV Rocephin per IM.  4. History of nonobstructive CAD with coronary spasm: Noted to have diffuse moderate disease in 2003. Await scan results.  5. Hypertension - generally controlled down in nuc med.  6. Hyperlipidemia - on statin.  7. Hypokalemia - consider low dose standing repletion if K falls subtherapeutic.  8. Possible GI bleed: FOBT negative but patient reported blood in the stool for the past 2 months.  Management per primary team. Hgb 9-10 range recently.  She is stable from cardiac standpoint to be discharged home, we will arrange for an outpatient follow up.  Aris LotKatarina  Delton See, MD 05/27/2017

## 2017-05-27 NOTE — Progress Notes (Signed)
Pt has orders to be discharged. Discharge instructions given and pt has no additional questions at this time. Medication regimen reviewed and pt educated. Pt verbalized understanding and has no additional questions. Telemetry box removed. IV removed and site in good condition. Pt stable and waiting for transportation.  Mellina Benison RN 

## 2017-05-28 ENCOUNTER — Telehealth: Payer: Self-pay

## 2017-05-28 LAB — CULTURE, BLOOD (ROUTINE X 2)
CULTURE: NO GROWTH
CULTURE: NO GROWTH
Special Requests: ADEQUATE
Special Requests: ADEQUATE

## 2017-05-28 NOTE — Telephone Encounter (Signed)
Called and patient is scheduled for 06/10/17 at 1:00 pm with Dr. Everardo AllEllison. I have called patient to notify her but I have been unsucesssful contacting her so far. I will keep trying

## 2017-05-28 NOTE — Telephone Encounter (Signed)
I called and got patient an Endocrine appointment on 06/10/17 at 1:00 with Dr. Everardo AllEllison. Dr. Durene CalHunter is aware. I will call patient and make her aware.

## 2017-05-28 NOTE — Telephone Encounter (Signed)
This is an urgent referral- please follow up today to help get this scheduled- I could see that in the comments but I need help actually getting her scheduled

## 2017-05-28 NOTE — Telephone Encounter (Signed)
Perfect Katherine MuirJamie- was considering radioactive iodine evaluation or methimazole option but since she is on atenolol and getting in so soon will hold off for now

## 2017-05-28 NOTE — Telephone Encounter (Signed)
Spoke with patient who verbalized understanding of an Endocrinology appointment on 06/10/17 at 1:00 with Dr. Everardo AllEllison

## 2017-05-31 ENCOUNTER — Encounter: Payer: Self-pay | Admitting: Physician Assistant

## 2017-05-31 ENCOUNTER — Telehealth: Payer: Self-pay

## 2017-05-31 NOTE — Telephone Encounter (Signed)
LMTCB

## 2017-06-01 ENCOUNTER — Ambulatory Visit: Payer: Medicare HMO | Admitting: Family Medicine

## 2017-06-01 NOTE — Telephone Encounter (Signed)
Attempted to reach patient, voicemail not working. Pt scheduled to see Dr. Durene CalHunter today for hospital follow up.

## 2017-06-03 ENCOUNTER — Telehealth: Payer: Self-pay | Admitting: Family Medicine

## 2017-06-03 DIAGNOSIS — E061 Subacute thyroiditis: Secondary | ICD-10-CM | POA: Diagnosis not present

## 2017-06-03 DIAGNOSIS — M6281 Muscle weakness (generalized): Secondary | ICD-10-CM | POA: Diagnosis not present

## 2017-06-03 DIAGNOSIS — E440 Moderate protein-calorie malnutrition: Secondary | ICD-10-CM | POA: Diagnosis not present

## 2017-06-03 DIAGNOSIS — I2510 Atherosclerotic heart disease of native coronary artery without angina pectoris: Secondary | ICD-10-CM | POA: Diagnosis not present

## 2017-06-03 DIAGNOSIS — I214 Non-ST elevation (NSTEMI) myocardial infarction: Secondary | ICD-10-CM | POA: Diagnosis not present

## 2017-06-03 DIAGNOSIS — E785 Hyperlipidemia, unspecified: Secondary | ICD-10-CM | POA: Diagnosis not present

## 2017-06-03 DIAGNOSIS — I10 Essential (primary) hypertension: Secondary | ICD-10-CM | POA: Diagnosis not present

## 2017-06-03 NOTE — Telephone Encounter (Signed)
Amber needs verbal order for PT , skilled nursing and social worker consult

## 2017-06-04 ENCOUNTER — Encounter: Payer: Self-pay | Admitting: Family Medicine

## 2017-06-04 ENCOUNTER — Ambulatory Visit (INDEPENDENT_AMBULATORY_CARE_PROVIDER_SITE_OTHER): Payer: Medicare HMO | Admitting: Family Medicine

## 2017-06-04 VITALS — BP 124/58 | HR 67 | Temp 98.5°F | Ht 66.0 in | Wt 132.4 lb

## 2017-06-04 DIAGNOSIS — I10 Essential (primary) hypertension: Secondary | ICD-10-CM

## 2017-06-04 DIAGNOSIS — G43109 Migraine with aura, not intractable, without status migrainosus: Secondary | ICD-10-CM

## 2017-06-04 DIAGNOSIS — R569 Unspecified convulsions: Secondary | ICD-10-CM

## 2017-06-04 DIAGNOSIS — N3000 Acute cystitis without hematuria: Secondary | ICD-10-CM | POA: Diagnosis not present

## 2017-06-04 DIAGNOSIS — I2510 Atherosclerotic heart disease of native coronary artery without angina pectoris: Secondary | ICD-10-CM

## 2017-06-04 DIAGNOSIS — E0590 Thyrotoxicosis, unspecified without thyrotoxic crisis or storm: Secondary | ICD-10-CM | POA: Diagnosis not present

## 2017-06-04 DIAGNOSIS — E440 Moderate protein-calorie malnutrition: Secondary | ICD-10-CM

## 2017-06-04 LAB — CBC
HCT: 32.3 % — ABNORMAL LOW (ref 35.0–45.0)
Hemoglobin: 10.2 g/dL — ABNORMAL LOW (ref 11.7–15.5)
MCH: 29.8 pg (ref 27.0–33.0)
MCHC: 31.6 g/dL — ABNORMAL LOW (ref 32.0–36.0)
MCV: 94.4 fL (ref 80.0–100.0)
MPV: 10.5 fL (ref 7.5–12.5)
PLATELETS: 293 10*3/uL (ref 140–400)
RBC: 3.42 MIL/uL — AB (ref 3.80–5.10)
RDW: 15.1 % — AB (ref 11.0–15.0)
WBC: 5.4 10*3/uL (ref 3.8–10.8)

## 2017-06-04 MED ORDER — POTASSIUM CHLORIDE ER 10 MEQ PO TBCR: 10.0000 meq | EXTENDED_RELEASE_TABLET | Freq: Two times a day (BID) | ORAL | 5 refills | Status: DC

## 2017-06-04 NOTE — Progress Notes (Signed)
Subjective:  [name] is a 71 y.o. year old very pleasant female patient who presents for/with See problem oriented charting ROS- states headache frequency and intensity is better since being on atenolol, denies ches tpan, UTI symptoms have resolved   Past Medical History-  Patient Active Problem List   Diagnosis Date Noted  . Hyperthyroidism 05/22/2017    Priority: High  . Migraine 09/19/2014    Priority: High  . CAD (coronary artery disease)     Priority: High  . Seizures (HCC)     Priority: High  . Protein-calorie malnutrition, moderate (HCC) 05/22/2017    Priority: Medium  . Overactive bladder 09/19/2014    Priority: Medium  . Hyperglycemia 09/19/2014    Priority: Medium  . Hypercholesteremia     Priority: Medium  . GERD (gastroesophageal reflux disease) 10/04/2011    Priority: Medium  . HTN (hypertension) 10/01/2011    Priority: Medium  . Hypokalemia 06/01/2014    Priority: Low  . Arthritis     Priority: Low  . Demand ischemia (HCC)   . Elevated lactic acid level 05/22/2017  . Dark stools 05/22/2017  . Hypomagnesemia 05/22/2017  . Hypercalcemia 05/22/2017  . Subacute thyroiditis 05/22/2017    Medications- reviewed and updated Current Outpatient Prescriptions  Medication Sig Dispense Refill  . acetaminophen (TYLENOL) 325 MG tablet Take 650 mg by mouth every 6 (six) hours as needed for moderate pain.     . Aloe Vera 25 MG CAPS Take 25 mg by mouth daily.     Marland Kitchen. amLODipine (NORVASC) 10 MG tablet take 1 tablet by mouth once daily (Patient taking differently: take 10 mg by mouth once daily) 30 tablet 5  . aspirin EC 81 MG EC tablet Take 1 tablet (81 mg total) by mouth daily.    Marland Kitchen. atenolol (TENORMIN) 50 MG tablet Take 1 tablet (50 mg total) by mouth daily. 60 tablet 2  . atorvastatin (LIPITOR) 20 MG tablet take 1 tablet by mouth once daily (Patient taking differently: take 20 mg by mouth once daily) 90 tablet 1  . cholecalciferol (VITAMIN D) 400 UNITS TABS tablet Take  400 Units by mouth every other day.     . Cyanocobalamin (VITAMIN B 12 PO) Take 1 capsule by mouth every other day.     . esomeprazole (NEXIUM) 40 MG capsule Take 1 capsule (40 mg total) by mouth daily. (Patient taking differently: Take 40 mg by mouth 2 (two) times daily. ) 90 capsule 3  . ezetimibe (ZETIA) 10 MG tablet take 1 tablet by mouth once daily (Patient taking differently: take 10 mg by mouth once daily) 30 tablet 5  . feeding supplement, ENSURE ENLIVE, (ENSURE ENLIVE) LIQD Take 237 mLs by mouth 2 (two) times daily between meals. 237 mL 12  . ferrous sulfate 325 (65 FE) MG tablet Take 1 tablet (325 mg total) by mouth daily. 30 tablet 0  . hydrocortisone (ANUSOL-HC) 25 MG suppository Place 1 suppository (25 mg total) rectally 2 (two) times daily. 12 suppository 0  . isosorbide mononitrate (IMDUR) 60 MG 24 hr tablet take 2 tablets by mouth once daily (Patient taking differently: take 120 mg by mouth once daily) 60 tablet 5  . lisinopril (PRINIVIL,ZESTRIL) 20 MG tablet take 1 tablet by mouth once daily (Patient taking differently: take 20 mg by mouth once daily) 90 tablet 1  . nitroGLYCERIN (NITROSTAT) 0.4 MG SL tablet place 1 tablet under the tongue if needed every 5 minutes for chest pain for 3 doses (Patient taking  differently: place 0.4 mg under the tongue if needed every 5 minutes for chest pain for 3 doses) 25 tablet 1  . oxybutynin (DITROPAN-XL) 10 MG 24 hr tablet Take 1 tablet (10 mg total) by mouth daily. 90 tablet 3  . potassium chloride (KLOR-CON 10) 10 MEQ tablet Take 1 tablet (10 mEq total) by mouth 2 (two) times daily. 60 tablet 5  . traMADol (ULTRAM) 50 MG tablet Take 1-2 tablets (50-100 mg total) by mouth every 6 (six) hours as needed for moderate pain. 30 tablet 0  . venlafaxine XR (EFFEXOR-XR) 150 MG 24 hr capsule take 1 capsule by mouth at bedtime (Patient taking differently: take 150 mg by mouth at bedtime) 30 capsule 5  . vitamin C (ASCORBIC ACID) 500 MG tablet Take 500 mg by  mouth every other day.     . vitamin E (VITAMIN E) 1000 UNIT capsule Take 1,000 Units by mouth every other day.      No current facility-administered medications for this visit.       Objective: BP (!) 124/58 (BP Location: Left Arm, Patient Position: Sitting, Cuff Size: Normal)   Pulse 67   Temp 98.5 F (36.9 C) (Oral)   Ht 5\' 6"  (1.676 m)   Wt 132 lb 6.4 oz (60.1 kg)   SpO2 97%   BMI 21.37 kg/m  Gen: NAD, resting comfortably No obvious ophthalmopathy or thyromegaly. Thyroid nontender CV: RRR no murmurs rubs or gallops Lungs: CTAB no crackles, wheeze, rhonchi Abdomen: soft/nontender/nondistended/normal bowel sounds. No rebound or guarding.  Ext: no edema Skin: warm, dry  Assessment/Plan:  CAD (coronary artery disease) S: when hospitalized found to have troponin 0.27- heparin was started. Ct angio negative for PE. Cardiology involved echo- hypokinesis of basal inferior wall. Stress test was abnormal- left heart cath with nonobstructive CAD and thought demand ischemia from hyperthyroidism and acute illness.   Prior had vasospasm 2003 as well as nonobstructive disease 05/17/14 A/P: continue aspirin, atorvastatin, zetia, imdur- has upcoming follow up for hyperthyroidism which likely contributed to demand ischemia.   Migraine S: migraines better with atenolol instead of coreg- less frequent and less severe A/P: requests tramadol refill but still not willing to sign pain contract despite counseling.    Seizures Once again- neurology was ok with low dose tramadol as has been on several years and no seizures- see migraines section  Protein-calorie malnutrition, moderate (HCC) Low protein- encouraged ensure 1-2x a day by follow up message to patient. Getting thyroid issues corrected should help with weight  UTI (urinary tract infection) UTI symptoms resolved on cephalosporin  Hyperthyroidism She presented to hospital with generalized weakness, shortness of breath, chest pain,  neck pain, and weight down 20 lbs in 3 monthsHas endocrine appointment next week. Continue atenolol. Get TSI. Attempted to order radioactive iodine scan but [place] [facility] does not complete these per their office and unable to get information form [facility]. Dr. Everardo All will be able to do this if necessary when patient sees him next week.     Orders Placed This Encounter  Procedures  . Thyroid stimulating immunoglobulin    Standing Status:   Future    Number of Occurrences:   1    Standing Expiration Date:   06/04/2018  . CBC    [place]    Standing Status:   Future    Number of Occurrences:   1    Standing Expiration Date:   06/04/2018  . Comprehensive metabolic panel    [place]    Standing  Status:   Future    Number of Occurrences:   1    Standing Expiration Date:   06/04/2018   Long term hypokalemia despite ace-I - continue medication Meds ordered this encounter  Medications  . potassium chloride (KLOR-CON 10) 10 MEQ tablet    Sig: Take 1 tablet (10 mEq total) by mouth 2 (two) times daily.    Dispense:  60 tablet    Refill:  5    Return precautions advised.  Tana Conch, MD

## 2017-06-04 NOTE — Telephone Encounter (Signed)
Called and provided verbal order as requested 

## 2017-06-04 NOTE — Patient Instructions (Signed)
Please stop by lab before you go  Please keep thyroid doctor/endocrine visit next week  If you change your mind and want to sign pain contract I will send in tramadol for you

## 2017-06-05 LAB — COMPREHENSIVE METABOLIC PANEL
ALBUMIN: 3.5 g/dL — AB (ref 3.6–5.1)
ALK PHOS: 48 U/L (ref 33–130)
ALT: 27 U/L (ref 6–29)
AST: 20 U/L (ref 10–35)
BUN: 14 mg/dL (ref 7–25)
CALCIUM: 9.1 mg/dL (ref 8.6–10.4)
CO2: 22 mmol/L (ref 20–32)
Chloride: 108 mmol/L (ref 98–110)
Creat: 0.79 mg/dL (ref 0.60–0.93)
Glucose, Bld: 99 mg/dL (ref 65–99)
Potassium: 4.2 mmol/L (ref 3.5–5.3)
SODIUM: 139 mmol/L (ref 135–146)
TOTAL PROTEIN: 6 g/dL — AB (ref 6.1–8.1)
Total Bilirubin: 0.7 mg/dL (ref 0.2–1.2)

## 2017-06-05 NOTE — Assessment & Plan Note (Addendum)
Low protein- encouraged ensure 1-2x a day by follow up message to patient. Getting thyroid issues corrected should help with weight

## 2017-06-05 NOTE — Assessment & Plan Note (Signed)
UTI symptoms resolved on cephalosporin

## 2017-06-05 NOTE — Assessment & Plan Note (Signed)
S: migraines better with atenolol instead of coreg- less frequent and less severe A/P: requests tramadol refill but still not willing to sign pain contract despite counseling.

## 2017-06-05 NOTE — Assessment & Plan Note (Signed)
Once again- neurology was ok with low dose tramadol as has been on several years and no seizures- see migraines section

## 2017-06-05 NOTE — Assessment & Plan Note (Signed)
She presented to hospital with generalized weakness, shortness of breath, chest pain, neck pain, and weight down 20 lbs in 3 monthsHas endocrine appointment next week. Continue atenolol. Get TSI. Attempted to order radioactive iodine scan but [place] [facility] does not complete these per their office and unable to get information form [facility]. Dr. Everardo AllEllison will be able to do this if necessary when patient sees him next week.

## 2017-06-05 NOTE — Assessment & Plan Note (Signed)
S: when hospitalized found to have troponin 0.27- heparin was started. Ct angio negative for PE. Cardiology involved echo- hypokinesis of basal inferior wall. Stress test was abnormal- left heart cath with nonobstructive CAD and thought demand ischemia from hyperthyroidism and acute illness.   Prior had vasospasm 2003 as well as nonobstructive disease 05/17/14 A/P: continue aspirin, atorvastatin, zetia, imdur- has upcoming follow up for hyperthyroidism which likely contributed to demand ischemia.

## 2017-06-09 LAB — THYROID STIMULATING IMMUNOGLOBULIN

## 2017-06-10 ENCOUNTER — Encounter: Payer: Self-pay | Admitting: Endocrinology

## 2017-06-10 ENCOUNTER — Ambulatory Visit (INDEPENDENT_AMBULATORY_CARE_PROVIDER_SITE_OTHER): Payer: Medicare HMO | Admitting: Endocrinology

## 2017-06-10 VITALS — BP 116/60 | HR 75 | Wt 126.2 lb

## 2017-06-10 DIAGNOSIS — E440 Moderate protein-calorie malnutrition: Secondary | ICD-10-CM | POA: Diagnosis not present

## 2017-06-10 DIAGNOSIS — E0590 Thyrotoxicosis, unspecified without thyrotoxic crisis or storm: Secondary | ICD-10-CM

## 2017-06-10 DIAGNOSIS — M6281 Muscle weakness (generalized): Secondary | ICD-10-CM | POA: Diagnosis not present

## 2017-06-10 DIAGNOSIS — E061 Subacute thyroiditis: Secondary | ICD-10-CM | POA: Diagnosis not present

## 2017-06-10 DIAGNOSIS — I10 Essential (primary) hypertension: Secondary | ICD-10-CM | POA: Diagnosis not present

## 2017-06-10 DIAGNOSIS — I2510 Atherosclerotic heart disease of native coronary artery without angina pectoris: Secondary | ICD-10-CM | POA: Diagnosis not present

## 2017-06-10 DIAGNOSIS — E785 Hyperlipidemia, unspecified: Secondary | ICD-10-CM | POA: Diagnosis not present

## 2017-06-10 DIAGNOSIS — I214 Non-ST elevation (NSTEMI) myocardial infarction: Secondary | ICD-10-CM | POA: Diagnosis not present

## 2017-06-10 MED ORDER — METHIMAZOLE 10 MG PO TABS: 40.0000 mg | ORAL_TABLET | Freq: Two times a day (BID) | ORAL | 1 refills | Status: DC

## 2017-06-10 NOTE — Progress Notes (Signed)
Subjective:    Patient ID: [name], female    DOB: 09/01/1946, 71 y.o.   MRN: [id number]  HPI Pt is referred by Dr Durene CalHunter, for hyperthyroidism.  she was dx'ed with hyperthyroidism in 2012.  she has never been on therapy for this.  she has never had XRT to the anterior neck, or thyroid surgery.  she does not consume kelp or any other prescribed or non-prescribed thyroid medication.  she has never been on amiodarone.  She had moderate tremor, and assoc palpitations.  Since in the hospital, sxs are much better.  Past Medical History:  Diagnosis Date  . Arthritis   . Chronic lower back pain   . Cluster headache   . Coronary artery disease    a. 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 non obs dz [facility] 2007. c. [facility].   . Diverticulitis    a. Hx microperf 2012 - hospitalizated.  Marland Kitchen. GERD (gastroesophageal reflux disease)   . Hypercholesteremia   . Hypertension   . PVC's (premature ventricular contractions)    a. Hx of trigeminy/bigeminy.  . Seizures (HCC)   . UTI (lower urinary tract infection)     Past Surgical History:  Procedure Laterality Date  . ABDOMINAL HYSTERECTOMY  1987   "partial"-still has ovaries  . CARDIAC CATHETERIZATION  05/17/2014  . CORONARY ANGIOPLASTY WITH STENT PLACEMENT  1995   "1"  . JOINT REPLACEMENT     right knee  . LEFT HEART CATH AND CORONARY ANGIOGRAPHY N/A 05/26/2017   Procedure: Left Heart Cath and Coronary Angiography;  Surgeon: Corky CraftsVaranasi, Jayadeep S, MD;  [facility];  Service: Cardiovascular;  Laterality: N/A;  . LEFT HEART CATHETERIZATION WITH CORONARY ANGIOGRAM N/A 05/17/2014   Procedure: LEFT HEART CATHETERIZATION WITH CORONARY ANGIOGRAM;  Surgeon: Marykay Lexavid W Harding, MD;  [facility];  Service: Cardiovascular;  Laterality: N/A;  . TOTAL KNEE ARTHROPLASTY Right 2005  . TUBAL LIGATION  1970  . VASCULAR SURGERY      Social History   Social History  . Marital status: Married    Spouse name: N/A  . Number of  children: 4  . Years of education: 4   Occupational History  .      retired   Social History Main Topics  . Smoking status: Never Smoker  . Smokeless tobacco: Never Used  . Alcohol use No  . Drug use: No  . Sexual activity: No   Other Topics Concern  . Not on file   Social History Narrative   Separated. 4 children from first marriage. 16 grandkids.       Retired from [employer] for 25 years, went to [employer] for 5 years. Retired 2003 after MI      Hobbies: babysit/active with children      Right-handed      Caffeine: 24 oz soda per day          Current Outpatient Prescriptions on File Prior to Visit  Medication Sig Dispense Refill  . acetaminophen (TYLENOL) 325 MG tablet Take 650 mg by mouth every 6 (six) hours as needed for moderate pain.     . Aloe Vera 25 MG CAPS Take 25 mg by mouth daily.     Marland Kitchen. amLODipine (NORVASC) 10 MG tablet take 1 tablet by mouth once daily (Patient taking differently: take 10 mg by mouth once daily) 30 tablet 5  . aspirin EC 81 MG EC tablet Take 1 tablet (81 mg total) by  mouth daily.    Marland Kitchen atenolol (TENORMIN) 50 MG tablet Take 1 tablet (50 mg total) by mouth daily. 60 tablet 2  . atorvastatin (LIPITOR) 20 MG tablet take 1 tablet by mouth once daily (Patient taking differently: take 20 mg by mouth once daily) 90 tablet 1  . cholecalciferol (VITAMIN D) 400 UNITS TABS tablet Take 400 Units by mouth every other day.     . Cyanocobalamin (VITAMIN B 12 PO) Take 1 capsule by mouth every other day.     . esomeprazole (NEXIUM) 40 MG capsule Take 1 capsule (40 mg total) by mouth daily. (Patient taking differently: Take 40 mg by mouth 2 (two) times daily. ) 90 capsule 3  . ezetimibe (ZETIA) 10 MG tablet take 1 tablet by mouth once daily (Patient taking differently: take 10 mg by mouth once daily) 30 tablet 5  . feeding supplement, ENSURE ENLIVE, (ENSURE ENLIVE) LIQD Take 237 mLs by mouth 2 (two) times daily between meals. 237 mL 12  . ferrous sulfate 325 (65 FE)  MG tablet Take 1 tablet (325 mg total) by mouth daily. 30 tablet 0  . hydrocortisone (ANUSOL-HC) 25 MG suppository Place 1 suppository (25 mg total) rectally 2 (two) times daily. 12 suppository 0  . isosorbide mononitrate (IMDUR) 60 MG 24 hr tablet take 2 tablets by mouth once daily (Patient taking differently: take 120 mg by mouth once daily) 60 tablet 5  . lisinopril (PRINIVIL,ZESTRIL) 20 MG tablet take 1 tablet by mouth once daily (Patient taking differently: take 20 mg by mouth once daily) 90 tablet 1  . nitroGLYCERIN (NITROSTAT) 0.4 MG SL tablet place 1 tablet under the tongue if needed every 5 minutes for chest pain for 3 doses (Patient taking differently: place 0.4 mg under the tongue if needed every 5 minutes for chest pain for 3 doses) 25 tablet 1  . oxybutynin (DITROPAN-XL) 10 MG 24 hr tablet Take 1 tablet (10 mg total) by mouth daily. 90 tablet 3  . potassium chloride (KLOR-CON 10) 10 MEQ tablet Take 1 tablet (10 mEq total) by mouth 2 (two) times daily. 60 tablet 5  . traMADol (ULTRAM) 50 MG tablet Take 1-2 tablets (50-100 mg total) by mouth every 6 (six) hours as needed for moderate pain. 30 tablet 0  . venlafaxine XR (EFFEXOR-XR) 150 MG 24 hr capsule take 1 capsule by mouth at bedtime (Patient taking differently: take 150 mg by mouth at bedtime) 30 capsule 5  . vitamin C (ASCORBIC ACID) 500 MG tablet Take 500 mg by mouth every other day.     . vitamin E (VITAMIN E) 1000 UNIT capsule Take 1,000 Units by mouth every other day.      No current facility-administered medications on file prior to visit.     Allergies  Allergen Reactions  . Sulfa Drugs Cross Reactors Shortness Of Breath and Palpitations    Family History  Problem Relation Age of Onset  . CAD Brother   . Diabetes Brother   . CAD Father   . Diabetes Father   . Diabetes Mother        father, sister, brothers  . Diabetes Sister   . Thyroid disease Sister     BP 116/60   Pulse 75   Wt 126 lb 3.2 oz (57.2 kg)    SpO2 96%   BMI 20.37 kg/m    Review of Systems denies headache, hoarseness, diplopia, sob, diarrhea, muscle weakness, excessive diaphoresis, leg swelling, easy bruising seizure, anxiety, and heat intolerance. She  has recently lost 20 lbs.  She has urinary frequency, and rhinorrhea     Objective:   Physical Exam VS: see vs page GEN: no distress HEAD: head: no deformity eyes: no periorbital swelling, no proptosis external nose and ears are normal.  mouth: no lesion seen NECK: thyroid is slightly and diffusely enlarged.   CHEST WALL: no deformity LUNGS: clear to auscultation CV: reg rate and rhythm, no murmur ABD: abdomen is soft, nontender.  no hepatosplenomegaly.  not distended.  no hernia MUSCULOSKELETAL: muscle bulk and strength are grossly normal.  no obvious joint swelling.  gait is normal and steady EXTEMITIES: no deformity.  no ulcer on the feet.  feet are of normal color and temp.  no edema PULSES: dorsalis pedis intact bilat.  no carotid bruit NEURO:  cn 2-12 grossly intact.   readily moves all 4's.  sensation is intact to touch on the feet.  No tremor SKIN:  Normal texture and temperature.  No rash or suspicious lesion is visible.  Not diaphoretic. NODES:  None palpable at the neck PSYCH: alert, well-oriented.  Does not appear anxious nor depressed.    Lab Results  Component Value Date   TSH <0.010 (L) 05/22/2017   [place]: Heterogeneous hyperemic thyroid, normal in size without nodule.  I have reviewed outside records, and summarized: Pt was noted to have hyperthyroidism, and referred here.  She was recently in the hospital with MI.  She was rx'ed atenolol.    Assessment & Plan:  Grave's Dz, new Hyperthyroidism, due to the above Recent MI: she needs prompt improvement in hyperthyroidism.   Patient Instructions  I have sent a prescription to your pharmacy (called "methimazole"), to slow the thyroid. If ever you have fever while taking methimazole, stop it and call  us, even if the reason is obvious, because of the risk of a rare side-effect. Please come back for a follow-up appointment in 2-3 weeks.        Hyperthyroidism Hyperthyroidism is when the thyroid is too active (overactive). Your thyroid is a large gland that is located in your neck. The thyroid helps to control how your body uses food (metabolism). When your thyroid is overactive, it produces too much of a hormone called thyroxine. What are the causes? Causes of hyperthyroidism may include:  Graves disease. This is when your immune system attacks the thyroid gland. This is the most common cause.  Inflammation of the thyroid gland.  Tumor in the thyroid gland or somewhere else.  Excessive use of thyroid medicines, including: ? Prescription thyroid supplement. ? Herbal supplements that mimic thyroid hormones.  Solid or fluid-filled lumps within your thyroid gland (thyroid nodules).  Excessive ingestion of iodine.  What increases the risk?  Being female.  Having a family history of thyroid conditions. What are the signs or symptoms? Signs and symptoms of hyperthyroidism may include:  Nervousness.  Inability to tolerate heat.  Unexplained weight loss.  Diarrhea.  Change in the texture of hair or skin.  Heart skipping beats or making extra beats.  Rapid heart rate.  Loss of menstruation.  Shaky hands.  Fatigue.  Restlessness.  Increased appetite.  Sleep problems.  Enlarged thyroid gland or nodules.  How is this diagnosed? Diagnosis of hyperthyroidism may include:  Medical history and physical exam.  Blood tests.  Ultrasound tests.  How is this treated? Treatment may include:  Medicines to control your thyroid.  Surgery to remove your thyroid.  Radiation therapy.  Follow these instructions at home:  Take  medicines only as directed by your health care provider.  Do not use any tobacco products, including cigarettes, chewing tobacco, or  electronic cigarettes. If you need help quitting, ask your health care provider.  Do not exercise or do physical activity until your health care provider approves.  Keep all follow-up appointments as directed by your health care provider. This is important. Contact a health care provider if:  Your symptoms do not get better with treatment.  You have fever.  You are taking thyroid replacement medicine and you: ? Have depression. ? Feel mentally and physically slow. ? Have weight gain. Get help right away if:  You have decreased alertness or a change in your awareness.  You have abdominal pain.  You feel dizzy.  You have a rapid heartbeat.  You have an irregular heartbeat. This information is not intended to replace advice given to you by your health care provider. Make sure you discuss any questions you have with your health care provider. Document Released: 10/12/2005 Document Revised: 03/12/2016 Document Reviewed: 02/27/2014 Elsevier Interactive Patient Education  2017 [employer].

## 2017-06-10 NOTE — Patient Instructions (Addendum)
I have sent a prescription to your pharmacy (called "methimazole"), to slow the thyroid. If ever you have fever while taking methimazole, stop it and call us, even if the reason is obvious, because of the risk of a rare side-effect. Please come back for a follow-up appointment in 2-3 weeks.        Hyperthyroidism Hyperthyroidism is when the thyroid is too active (overactive). Your thyroid is a large gland that is located in your neck. The thyroid helps to control how your body uses food (metabolism). When your thyroid is overactive, it produces too much of a hormone called thyroxine. What are the causes? Causes of hyperthyroidism may include:  Graves disease. This is when your immune system attacks the thyroid gland. This is the most common cause.  Inflammation of the thyroid gland.  Tumor in the thyroid gland or somewhere else.  Excessive use of thyroid medicines, including: ? Prescription thyroid supplement. ? Herbal supplements that mimic thyroid hormones.  Solid or fluid-filled lumps within your thyroid gland (thyroid nodules).  Excessive ingestion of iodine.  What increases the risk?  Being female.  Having a family history of thyroid conditions. What are the signs or symptoms? Signs and symptoms of hyperthyroidism may include:  Nervousness.  Inability to tolerate heat.  Unexplained weight loss.  Diarrhea.  Change in the texture of hair or skin.  Heart skipping beats or making extra beats.  Rapid heart rate.  Loss of menstruation.  Shaky hands.  Fatigue.  Restlessness.  Increased appetite.  Sleep problems.  Enlarged thyroid gland or nodules.  How is this diagnosed? Diagnosis of hyperthyroidism may include:  Medical history and physical exam.  Blood tests.  Ultrasound tests.  How is this treated? Treatment may include:  Medicines to control your thyroid.  Surgery to remove your thyroid.  Radiation therapy.  Follow these instructions  at home:  Take medicines only as directed by your health care provider.  Do not use any tobacco products, including cigarettes, chewing tobacco, or electronic cigarettes. If you need help quitting, ask your health care provider.  Do not exercise or do physical activity until your health care provider approves.  Keep all follow-up appointments as directed by your health care provider. This is important. Contact a health care provider if:  Your symptoms do not get better with treatment.  You have fever.  You are taking thyroid replacement medicine and you: ? Have depression. ? Feel mentally and physically slow. ? Have weight gain. Get help right away if:  You have decreased alertness or a change in your awareness.  You have abdominal pain.  You feel dizzy.  You have a rapid heartbeat.  You have an irregular heartbeat. This information is not intended to replace advice given to you by your health care provider. Make sure you discuss any questions you have with your health care provider. Document Released: 10/12/2005 Document Revised: 03/12/2016 Document Reviewed: 02/27/2014 Elsevier Interactive Patient Education  2017 [employer]

## 2017-06-11 ENCOUNTER — Telehealth: Payer: Self-pay | Admitting: Family Medicine

## 2017-06-11 DIAGNOSIS — E785 Hyperlipidemia, unspecified: Secondary | ICD-10-CM | POA: Diagnosis not present

## 2017-06-11 DIAGNOSIS — I10 Essential (primary) hypertension: Secondary | ICD-10-CM | POA: Diagnosis not present

## 2017-06-11 DIAGNOSIS — E061 Subacute thyroiditis: Secondary | ICD-10-CM | POA: Diagnosis not present

## 2017-06-11 DIAGNOSIS — E440 Moderate protein-calorie malnutrition: Secondary | ICD-10-CM | POA: Diagnosis not present

## 2017-06-11 DIAGNOSIS — I2510 Atherosclerotic heart disease of native coronary artery without angina pectoris: Secondary | ICD-10-CM | POA: Diagnosis not present

## 2017-06-11 DIAGNOSIS — I214 Non-ST elevation (NSTEMI) myocardial infarction: Secondary | ICD-10-CM | POA: Diagnosis not present

## 2017-06-11 DIAGNOSIS — M6281 Muscle weakness (generalized): Secondary | ICD-10-CM | POA: Diagnosis not present

## 2017-06-11 NOTE — Telephone Encounter (Signed)
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

## 2017-06-11 NOTE — Telephone Encounter (Signed)
Verbal orders approved per Dr Kirtland Bouchard

## 2017-06-13 ENCOUNTER — Other Ambulatory Visit: Payer: Self-pay | Admitting: Family Medicine

## 2017-06-14 ENCOUNTER — Telehealth: Payer: Self-pay | Admitting: Family Medicine

## 2017-06-14 NOTE — Telephone Encounter (Signed)
Natalia Leatherwood with [facility] would like verbal Home health OT therapy orders 1/wk 1 2 wk /2

## 2017-06-15 NOTE — Telephone Encounter (Signed)
Verbal order provided as requested 

## 2017-06-16 NOTE — Progress Notes (Deleted)
Cardiology Office Note    Date:  06/16/2017   ID:  [name], DOB 02/05/1946, MRN [id number]  PCP:  Shelva Majestic, MD  Cardiologist:  Dr. Katrinka Blazing  Chief Complaint: Hospital follow up for chest pain   History of Present Illness:   [name] is a 71 y.o. female HTN, HLD, subacute thyroiditis, GI bleed and nonobstructive CAD in 2003 with evidence of coronary spasm presented for hospital follow up.   Patient underwent cardiac catheterization on 05/17/2014 which continues to show mild to moderate diffuse CAD   Admitted 05/2017 for subacute thyroiditis with hyperthyroidism. Cardiology seen for elevated troponin (peak 0.27) and chest pain. Echo showed LVEF 55%, possible hypokinesis in 1 segment. Baseline EKG abnormal. LHC showed mild nonobstructive CAD, elevated troponin sec to demand ischemia, She has mild anemia, but negative FOBT. Also treated for possible UTI.   Here today for follow up.   Past Medical History:  Diagnosis Date  . Arthritis   . Chronic lower back pain   . Cluster headache   . Coronary artery disease    a. 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 non obs dz LHC 2007. c. [facility] obs [facility]-mod[facility].   . Diverticulitis    a. Hx microperf 2012 - hospitalizated.  Marland Kitchen GERD (gastroesophageal reflux disease)   . Hypercholesteremia   . Hypertension   . PVC's (premature ventricular contractions)    a. Hx of trigeminy/bigeminy.  . Seizures (HCC)   . UTI (lower urinary tract infection)     Past Surgical History:  Procedure Laterality Date  . ABDOMINAL HYSTERECTOMY  1987   "partial"-still has ovaries  . CARDIAC CATHETERIZATION  05/17/2014  . CORONARY ANGIOPLASTY WITH STENT PLACEMENT  1995   "1"  . JOINT REPLACEMENT     right knee  . LEFT HEART CATH AND CORONARY ANGIOGRAPHY N/A 05/26/2017   Procedure: Left Heart Cath and Coronary Angiography;  Surgeon: Corky Crafts, MD;  Location: [facility] INVASIVE CV LAB;  Service: Cardiovascular;  Laterality: N/A;  . LEFT HEART  CATHETERIZATION WITH CORONARY ANGIOGRAM N/A 05/17/2014   Procedure: LEFT HEART CATHETERIZATION WITH CORONARY ANGIOGRAM;  Surgeon: Marykay Lex, MD;  Location: [facility] CATH LAB;  Service: Cardiovascular;  Laterality: N/A;  . TOTAL KNEE ARTHROPLASTY Right 2005  . TUBAL LIGATION  1970  . VASCULAR SURGERY      Current Medications: Prior to Admission medications   Medication Sig Start Date End Date Taking? Authorizing Provider  acetaminophen (TYLENOL) 325 MG tablet Take 650 mg by mouth every 6 (six) hours as needed for moderate pain.     [provider]  Aloe Vera 25 MG CAPS Take 25 mg by mouth daily.     [provider]  amLODipine (NORVASC) 10 MG tablet take 1 tablet by mouth once daily 06/15/17   Shelva Majestic, MD  aspirin EC 81 MG EC tablet Take 1 tablet (81 mg total) by mouth daily. 05/18/14   Janetta Hora, PA-C  atenolol (TENORMIN) 50 MG tablet Take 1 tablet (50 mg total) by mouth daily. 05/27/17   Richarda Overlie, MD  atorvastatin (LIPITOR) 20 MG tablet take 1 tablet by mouth once daily Patient taking differently: take 20 mg by mouth once daily 05/06/17   Shelva Majestic, MD  cholecalciferol (VITAMIN D) 400 UNITS TABS tablet Take 400 Units by mouth every other day.     [provider]  Cyanocobalamin (VITAMIN B 12 PO) Take 1 capsule by mouth every other day.  [provider]  esomeprazole (NEXIUM) 40 MG capsule Take 1 capsule (40 mg total) by mouth daily. Patient taking differently: Take 40 mg by mouth 2 (two) times daily.  11/19/16   Shelva Majestic, MD  ezetimibe (ZETIA) 10 MG tablet take 1 tablet by mouth once daily Patient taking differently: take 10 mg by mouth once daily 01/11/17   Shelva Majestic, MD  feeding supplement, ENSURE ENLIVE, (ENSURE ENLIVE) LIQD Take 237 mLs by mouth 2 (two) times daily between meals. 05/25/17   Richarda Overlie, MD  ferrous sulfate 325 (65 FE) MG tablet Take 1 tablet (325 mg total) by mouth daily. 05/21/17    Jacalyn Lefevre, MD  hydrocortisone (ANUSOL-HC) 25 MG suppository Place 1 suppository (25 mg total) rectally 2 (two) times daily. 05/21/17   Jacalyn Lefevre, MD  isosorbide mononitrate (IMDUR) 60 MG 24 hr tablet take 2 tablets by mouth once daily Patient taking differently: take 120 mg by mouth once daily 01/11/17   Shelva Majestic, MD  lisinopril (PRINIVIL,ZESTRIL) 20 MG tablet take 1 tablet by mouth once daily Patient taking differently: take 20 mg by mouth once daily 04/05/17   Shelva Majestic, MD  methimazole (TAPAZOLE) 10 MG tablet Take 4 tablets (40 mg total) by mouth 2 (two) times daily. 06/10/17   Romero Belling, MD  nitroGLYCERIN (NITROSTAT) 0.4 MG SL tablet place 1 tablet under the tongue if needed every 5 minutes for chest pain for 3 doses Patient taking differently: place 0.4 mg under the tongue if needed every 5 minutes for chest pain for 3 doses 09/16/16   Shelva Majestic, MD  oxybutynin (DITROPAN-XL) 10 MG 24 hr tablet Take 1 tablet (10 mg total) by mouth daily. 11/19/16   Shelva Majestic, MD  potassium chloride (KLOR-CON 10) 10 MEQ tablet Take 1 tablet (10 mEq total) by mouth 2 (two) times daily. 06/04/17   Shelva Majestic, MD  traMADol (ULTRAM) 50 MG tablet Take 1-2 tablets (50-100 mg total) by mouth every 6 (six) hours as needed for moderate pain. 05/27/17   Richarda Overlie, MD  venlafaxine XR (EFFEXOR-XR) 150 MG 24 hr capsule take 1 capsule by mouth at bedtime 06/15/17   Shelva Majestic, MD  vitamin C (ASCORBIC ACID) 500 MG tablet Take 500 mg by mouth every other day.     [provider]  vitamin E (VITAMIN E) 1000 UNIT capsule Take 1,000 Units by mouth every other day.     [provider]    Allergies:   Sulfa drugs cross reactors   Social History   Social History  . Marital status: Married    Spouse name: N/A  . Number of children: 4  . Years of education: 4   Occupational History  .      retired   Social History Main Topics  . Smoking status:  Never Smoker  . Smokeless tobacco: Never Used  . Alcohol use No  . Drug use: No  . Sexual activity: No   Other Topics Concern  . Not on file   Social History Narrative   Separated. 4 children from first marriage. 16 grandkids.       Retired from [employer] for 25 years, went to [employer] for 5 years. Retired 2003 after MI      Hobbies: babysit/active with children      Right-handed      Caffeine: 24 oz soda per day           Family History:  The patient's family history includes CAD in her brother and father; Diabetes in her brother, father, mother, and sister; Thyroid disease in her sister. ***  ROS:   Please see the history of present illness.    ROS All other systems reviewed and are negative.   PHYSICAL EXAM:   VS:  There were no vitals taken for this visit.   GEN: Well nourished, well developed, in no acute distress  HEENT: normal  Neck: no JVD, carotid bruits, or masses Cardiac: ***RRR; no murmurs, rubs, or gallops,no edema  Respiratory:  clear to auscultation bilaterally, normal work of breathing GI: soft, nontender, nondistended, + BS MS: no deformity or atrophy  Skin: warm and dry, no rash Neuro:  Alert and Oriented x 3, Strength and sensation are intact Psych: euthymic mood, full affect  Wt Readings from Last 3 Encounters:  06/10/17 126 lb 3.2 oz (57.2 kg)  06/04/17 132 lb 6.4 oz (60.1 kg)  05/27/17 124 lb 9 oz (56.5 kg)      Studies/Labs Reviewed:   EKG:  EKG is ordered today.  The ekg ordered today demonstrates ***  Recent Labs: 05/10/2017: Pro B Natriuretic peptide (BNP) 289.0 05/22/2017: B Natriuretic Peptide 900.5; TSH <0.010 05/24/2017: Magnesium 1.9 06/04/2017: ALT 27; BUN 14; Creat 0.79; Hemoglobin 10.2; Platelets 293; Potassium 4.2; Sodium 139   Lipid Panel    Component Value Date/Time   CHOL 75 05/23/2017 0514   TRIG 58 05/23/2017 0514   HDL 25 (L) 05/23/2017 0514   CHOLHDL 3.0 05/23/2017 0514   VLDL 12 05/23/2017 0514   LDLCALC 38  05/23/2017 0514    Additional studies/ records that were reviewed today include:   Echocardiogram:  Cardiac Catheterization: 05/26/17  Prox RCA to Mid RCA lesion, 10 %stenosed.  Dist Cx lesion, 20 %stenosed.  Mid LAD lesion, 10 %stenosed.  There is no aortic valve stenosis.  Ost 1st Mrg lesion, 25 %stenosed.  The left ventricular systolic function is normal.  LV end diastolic pressure is normal.  The left ventricular ejection fraction is 55-65% by visual estimate.   No obstructive CAD.  Likely demand ischemia.  Continue preventive therapy.      ASSESSMENT & PLAN:    1. Non obstructive CAD  - Last cath 05/26/17 as above. Felt elevated troponin due to demand ischemia.   2. HTN  3. HLD    Medication Adjustments/Labs and Tests Ordered: Current medicines are reviewed at length with the patient today.  Concerns regarding medicines are outlined above.  Medication changes, Labs and Tests ordered today are listed in the Patient Instructions below. There are no Patient Instructions on file for this visit.   Lorelei Pont, [place]  06/16/2017 4:08 PM    [facility] [address] [place], [place], [place]  [address] Phone: [phone]; Fax: [phone]

## 2017-06-17 ENCOUNTER — Ambulatory Visit: Payer: Medicare HMO | Admitting: Physician Assistant

## 2017-06-17 DIAGNOSIS — I2510 Atherosclerotic heart disease of native coronary artery without angina pectoris: Secondary | ICD-10-CM | POA: Diagnosis not present

## 2017-06-17 DIAGNOSIS — I10 Essential (primary) hypertension: Secondary | ICD-10-CM | POA: Diagnosis not present

## 2017-06-17 DIAGNOSIS — E440 Moderate protein-calorie malnutrition: Secondary | ICD-10-CM | POA: Diagnosis not present

## 2017-06-17 DIAGNOSIS — E785 Hyperlipidemia, unspecified: Secondary | ICD-10-CM | POA: Diagnosis not present

## 2017-06-17 DIAGNOSIS — M6281 Muscle weakness (generalized): Secondary | ICD-10-CM | POA: Diagnosis not present

## 2017-06-17 DIAGNOSIS — E061 Subacute thyroiditis: Secondary | ICD-10-CM | POA: Diagnosis not present

## 2017-06-17 DIAGNOSIS — I214 Non-ST elevation (NSTEMI) myocardial infarction: Secondary | ICD-10-CM | POA: Diagnosis not present

## 2017-06-23 ENCOUNTER — Telehealth: Payer: Self-pay

## 2017-06-23 DIAGNOSIS — I214 Non-ST elevation (NSTEMI) myocardial infarction: Secondary | ICD-10-CM | POA: Diagnosis not present

## 2017-06-23 DIAGNOSIS — E061 Subacute thyroiditis: Secondary | ICD-10-CM | POA: Diagnosis not present

## 2017-06-23 DIAGNOSIS — M6281 Muscle weakness (generalized): Secondary | ICD-10-CM | POA: Diagnosis not present

## 2017-06-23 DIAGNOSIS — I10 Essential (primary) hypertension: Secondary | ICD-10-CM | POA: Diagnosis not present

## 2017-06-23 DIAGNOSIS — E440 Moderate protein-calorie malnutrition: Secondary | ICD-10-CM | POA: Diagnosis not present

## 2017-06-23 DIAGNOSIS — E785 Hyperlipidemia, unspecified: Secondary | ICD-10-CM | POA: Diagnosis not present

## 2017-06-23 DIAGNOSIS — I2510 Atherosclerotic heart disease of native coronary artery without angina pectoris: Secondary | ICD-10-CM | POA: Diagnosis not present

## 2017-06-23 NOTE — Telephone Encounter (Signed)
Jim @ [facility] called to ask for verbal orders to continue PT 1xwk/3wks to work on functional mobility and fall risk. He also reports that pt c/o abdominal pain and low back pain with some constipation and then loose stools. She states that the Tramadol does not help. He is concerned about drug seeking behavior. Pt also states that she feels she is not recovering as quickly from her last hospitalization.   Dr. Durene CalHunter - Please advise. Thanks!

## 2017-06-24 DIAGNOSIS — I10 Essential (primary) hypertension: Secondary | ICD-10-CM | POA: Diagnosis not present

## 2017-06-24 DIAGNOSIS — E440 Moderate protein-calorie malnutrition: Secondary | ICD-10-CM | POA: Diagnosis not present

## 2017-06-24 DIAGNOSIS — I2510 Atherosclerotic heart disease of native coronary artery without angina pectoris: Secondary | ICD-10-CM | POA: Diagnosis not present

## 2017-06-24 DIAGNOSIS — M6281 Muscle weakness (generalized): Secondary | ICD-10-CM | POA: Diagnosis not present

## 2017-06-24 DIAGNOSIS — E785 Hyperlipidemia, unspecified: Secondary | ICD-10-CM | POA: Diagnosis not present

## 2017-06-24 DIAGNOSIS — I214 Non-ST elevation (NSTEMI) myocardial infarction: Secondary | ICD-10-CM | POA: Diagnosis not present

## 2017-06-24 DIAGNOSIS — E061 Subacute thyroiditis: Secondary | ICD-10-CM | POA: Diagnosis not present

## 2017-06-24 NOTE — Telephone Encounter (Signed)
Yes thanks may provide verbal.   She has declined tramadol refills here due to not wanting to sign pain contract

## 2017-06-25 ENCOUNTER — Telehealth: Payer: Self-pay | Admitting: Endocrinology

## 2017-06-25 ENCOUNTER — Telehealth: Payer: Self-pay | Admitting: Family Medicine

## 2017-06-25 DIAGNOSIS — I2510 Atherosclerotic heart disease of native coronary artery without angina pectoris: Secondary | ICD-10-CM | POA: Diagnosis not present

## 2017-06-25 DIAGNOSIS — I10 Essential (primary) hypertension: Secondary | ICD-10-CM | POA: Diagnosis not present

## 2017-06-25 DIAGNOSIS — E061 Subacute thyroiditis: Secondary | ICD-10-CM | POA: Diagnosis not present

## 2017-06-25 DIAGNOSIS — E785 Hyperlipidemia, unspecified: Secondary | ICD-10-CM | POA: Diagnosis not present

## 2017-06-25 DIAGNOSIS — E440 Moderate protein-calorie malnutrition: Secondary | ICD-10-CM | POA: Diagnosis not present

## 2017-06-25 DIAGNOSIS — I214 Non-ST elevation (NSTEMI) myocardial infarction: Secondary | ICD-10-CM | POA: Diagnosis not present

## 2017-06-25 DIAGNOSIS — M6281 Muscle weakness (generalized): Secondary | ICD-10-CM | POA: Diagnosis not present

## 2017-06-25 NOTE — Telephone Encounter (Signed)
[facility] calling in reference to Rx for patient's thyroid. Patient has been having abdominal cramping and diarrhea with this medication. Patient wants to reduce amount. [facility] calling to let Dr. Everardo AllEllison know. Please call [facility] and advise.

## 2017-06-25 NOTE — Telephone Encounter (Signed)
Spoke with Santina Evansatherine & notified her of new dosage.

## 2017-06-25 NOTE — Telephone Encounter (Signed)
Ok, please reduce to 2 pills, twice a day I'll see you soon

## 2017-06-25 NOTE — Telephone Encounter (Signed)
Threasa AlphaJim Hoffman PT with home health verfd verbal orders to continue home health. See CMA [facility] and Dr Durene CalHunter prior note.

## 2017-06-28 DIAGNOSIS — M6281 Muscle weakness (generalized): Secondary | ICD-10-CM | POA: Diagnosis not present

## 2017-06-28 DIAGNOSIS — I214 Non-ST elevation (NSTEMI) myocardial infarction: Secondary | ICD-10-CM | POA: Diagnosis not present

## 2017-06-28 DIAGNOSIS — I2510 Atherosclerotic heart disease of native coronary artery without angina pectoris: Secondary | ICD-10-CM | POA: Diagnosis not present

## 2017-06-28 DIAGNOSIS — I10 Essential (primary) hypertension: Secondary | ICD-10-CM | POA: Diagnosis not present

## 2017-06-28 DIAGNOSIS — E440 Moderate protein-calorie malnutrition: Secondary | ICD-10-CM | POA: Diagnosis not present

## 2017-06-28 DIAGNOSIS — E785 Hyperlipidemia, unspecified: Secondary | ICD-10-CM | POA: Diagnosis not present

## 2017-06-28 DIAGNOSIS — E061 Subacute thyroiditis: Secondary | ICD-10-CM | POA: Diagnosis not present

## 2017-06-29 DIAGNOSIS — M6281 Muscle weakness (generalized): Secondary | ICD-10-CM | POA: Diagnosis not present

## 2017-06-29 DIAGNOSIS — E061 Subacute thyroiditis: Secondary | ICD-10-CM | POA: Diagnosis not present

## 2017-06-29 DIAGNOSIS — I10 Essential (primary) hypertension: Secondary | ICD-10-CM | POA: Diagnosis not present

## 2017-06-29 DIAGNOSIS — E440 Moderate protein-calorie malnutrition: Secondary | ICD-10-CM | POA: Diagnosis not present

## 2017-06-29 DIAGNOSIS — I214 Non-ST elevation (NSTEMI) myocardial infarction: Secondary | ICD-10-CM | POA: Diagnosis not present

## 2017-06-29 DIAGNOSIS — E785 Hyperlipidemia, unspecified: Secondary | ICD-10-CM | POA: Diagnosis not present

## 2017-06-29 DIAGNOSIS — I2510 Atherosclerotic heart disease of native coronary artery without angina pectoris: Secondary | ICD-10-CM | POA: Diagnosis not present

## 2017-07-01 ENCOUNTER — Ambulatory Visit: Payer: Medicare HMO | Admitting: Endocrinology

## 2017-07-01 DIAGNOSIS — Z0289 Encounter for other administrative examinations: Secondary | ICD-10-CM

## 2017-07-07 ENCOUNTER — Encounter: Payer: Self-pay | Admitting: Physician Assistant

## 2017-07-07 DIAGNOSIS — E785 Hyperlipidemia, unspecified: Secondary | ICD-10-CM | POA: Diagnosis not present

## 2017-07-07 DIAGNOSIS — E061 Subacute thyroiditis: Secondary | ICD-10-CM | POA: Diagnosis not present

## 2017-07-07 DIAGNOSIS — I2510 Atherosclerotic heart disease of native coronary artery without angina pectoris: Secondary | ICD-10-CM | POA: Diagnosis not present

## 2017-07-07 DIAGNOSIS — E440 Moderate protein-calorie malnutrition: Secondary | ICD-10-CM | POA: Diagnosis not present

## 2017-07-07 DIAGNOSIS — M6281 Muscle weakness (generalized): Secondary | ICD-10-CM | POA: Diagnosis not present

## 2017-07-07 DIAGNOSIS — I10 Essential (primary) hypertension: Secondary | ICD-10-CM | POA: Diagnosis not present

## 2017-07-07 DIAGNOSIS — I214 Non-ST elevation (NSTEMI) myocardial infarction: Secondary | ICD-10-CM | POA: Diagnosis not present

## 2017-07-09 ENCOUNTER — Other Ambulatory Visit: Payer: Self-pay | Admitting: Family Medicine

## 2017-07-16 ENCOUNTER — Ambulatory Visit (INDEPENDENT_AMBULATORY_CARE_PROVIDER_SITE_OTHER): Payer: Medicare HMO | Admitting: Family Medicine

## 2017-07-16 ENCOUNTER — Encounter: Payer: Self-pay | Admitting: Family Medicine

## 2017-07-16 VITALS — BP 126/70 | HR 62 | Temp 98.6°F | Wt 125.0 lb

## 2017-07-16 DIAGNOSIS — J069 Acute upper respiratory infection, unspecified: Secondary | ICD-10-CM | POA: Diagnosis not present

## 2017-07-16 DIAGNOSIS — E440 Moderate protein-calorie malnutrition: Secondary | ICD-10-CM | POA: Diagnosis not present

## 2017-07-16 DIAGNOSIS — E785 Hyperlipidemia, unspecified: Secondary | ICD-10-CM | POA: Diagnosis not present

## 2017-07-16 DIAGNOSIS — I2510 Atherosclerotic heart disease of native coronary artery without angina pectoris: Secondary | ICD-10-CM | POA: Diagnosis not present

## 2017-07-16 DIAGNOSIS — M6281 Muscle weakness (generalized): Secondary | ICD-10-CM | POA: Diagnosis not present

## 2017-07-16 DIAGNOSIS — E061 Subacute thyroiditis: Secondary | ICD-10-CM | POA: Diagnosis not present

## 2017-07-16 DIAGNOSIS — I10 Essential (primary) hypertension: Secondary | ICD-10-CM | POA: Diagnosis not present

## 2017-07-16 DIAGNOSIS — I214 Non-ST elevation (NSTEMI) myocardial infarction: Secondary | ICD-10-CM | POA: Diagnosis not present

## 2017-07-16 MED ORDER — BENZONATATE 100 MG PO CAPS: 100.0000 mg | ORAL_CAPSULE | Freq: Two times a day (BID) | ORAL | 0 refills | Status: DC | PRN

## 2017-07-16 NOTE — Progress Notes (Signed)
HPI:  Acute visit for upper respiratory illness: -started:3 days ago -symptoms:nasal congestion, sore throat, cough, postnasal drip -denies:fever, SOB, NVD, tooth pain -has tried: nothing, she was afraid to try any medications over-the-counter -sick contacts/travel/risks: no reported flu, strep or tick exposure  ROS: See pertinent positives and negatives per HPI.  Past Medical History:  Diagnosis Date  . Arthritis   . Chronic lower back pain   . Cluster headache   . Coronary artery disease    a. 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 non obs dz LHC 2007. c. Non obs dz (mild-mod) by [facility] 05/17/14.   . Diverticulitis    a. Hx microperf 2012 - hospitalizated.  Marland Kitchen GERD (gastroesophageal reflux disease)   . Hypercholesteremia   . Hypertension   . PVC's (premature ventricular contractions)    a. Hx of trigeminy/bigeminy.  . Seizures (HCC)   . UTI (lower urinary tract infection)     Past Surgical History:  Procedure Laterality Date  . ABDOMINAL HYSTERECTOMY  1987   "partial"-still has ovaries  . CARDIAC CATHETERIZATION  05/17/2014  . CORONARY ANGIOPLASTY WITH STENT PLACEMENT  1995   "1"  . JOINT REPLACEMENT     right knee  . LEFT HEART CATH AND CORONARY ANGIOGRAPHY N/A 05/26/2017   Procedure: Left Heart Cath and Coronary Angiography;  Surgeon: Corky Crafts, MD;  Location: [facility] INVASIVE CV LAB;  Service: Cardiovascular;  Laterality: N/A;  . LEFT HEART CATHETERIZATION WITH CORONARY ANGIOGRAM N/A 05/17/2014   Procedure: LEFT HEART CATHETERIZATION WITH CORONARY ANGIOGRAM;  Surgeon: Marykay Lex, MD;  Location: [facility] CATH LAB;  Service: Cardiovascular;  Laterality: N/A;  . TOTAL KNEE ARTHROPLASTY Right 2005  . TUBAL LIGATION  1970  . VASCULAR SURGERY      Family History  Problem Relation Age of Onset  . CAD Brother   . Diabetes Brother   . CAD Father   . Diabetes Father   . Diabetes Mother        father, sister, brothers  . Diabetes Sister   . Thyroid disease Sister     Social  History   Social History  . Marital status: Married    Spouse name: N/A  . Number of children: 4  . Years of education: 4   Occupational History  .      retired   Social History Main Topics  . Smoking status: Never Smoker  . Smokeless tobacco: Never Used  . Alcohol use No  . Drug use: No  . Sexual activity: No   Other Topics Concern  . None   Social History Narrative   Separated. 4 children from first marriage. 16 grandkids.       Retired from [employer] for 25 years, went to [employer] for 5 years. Retired 2003 after MI      Hobbies: babysit/active with children      Right-handed      Caffeine: 24 oz soda per day           Current Outpatient Prescriptions:  .  acetaminophen (TYLENOL) 325 MG tablet, Take 650 mg by mouth every 6 (six) hours as needed for moderate pain. , Disp: , Rfl:  .  Aloe Vera 25 MG CAPS, Take 25 mg by mouth daily. , Disp: , Rfl:  .  amLODipine (NORVASC) 10 MG tablet, take 1 tablet by mouth once daily, Disp: 30 tablet, Rfl: 5 .  aspirin EC 81 MG EC tablet, Take 1 tablet (81 mg total) by mouth daily., Disp: ,  Rfl:  .  atenolol (TENORMIN) 50 MG tablet, Take 1 tablet (50 mg total) by mouth daily., Disp: 60 tablet, Rfl: 2 .  atorvastatin (LIPITOR) 20 MG tablet, take 1 tablet by mouth once daily, Disp: 90 tablet, Rfl: 1 .  cholecalciferol (VITAMIN D) 400 UNITS TABS tablet, Take 400 Units by mouth every other day. , Disp: , Rfl:  .  Cyanocobalamin (VITAMIN B 12 PO), Take 1 capsule by mouth every other day. , Disp: , Rfl:  .  esomeprazole (NEXIUM) 40 MG capsule, Take 1 capsule (40 mg total) by mouth daily., Disp: 90 capsule, Rfl: 3 .  ezetimibe (ZETIA) 10 MG tablet, take 1 tablet by mouth once daily, Disp: 30 tablet, Rfl: 5 .  feeding supplement, ENSURE ENLIVE, (ENSURE ENLIVE) LIQD, Take 237 mLs by mouth 2 (two) times daily between meals., Disp: 237 mL, Rfl: 12 .  ferrous sulfate 325 (65 FE) MG tablet, Take 1 tablet (325 mg total) by mouth daily., Disp: 30  tablet, Rfl: 0 .  hydrocortisone (ANUSOL-HC) 25 MG suppository, Place 1 suppository (25 mg total) rectally 2 (two) times daily., Disp: 12 suppository, Rfl: 0 .  isosorbide mononitrate (IMDUR) 60 MG 24 hr tablet, take 2 tablets by mouth once daily, Disp: 60 tablet, Rfl: 5 .  lisinopril (PRINIVIL,ZESTRIL) 20 MG tablet, take 1 tablet by mouth once daily, Disp: 90 tablet, Rfl: 1 .  methimazole (TAPAZOLE) 10 MG tablet, Take 4 tablets (40 mg total) by mouth 2 (two) times daily., Disp: 240 tablet, Rfl: 1 .  nitroGLYCERIN (NITROSTAT) 0.4 MG SL tablet, PLACE 1 TABLET UNDER THE TONGUE EVERY 5 MINUTES FOR CHEST PAIN FOR 3 DOSES, Disp: 25 tablet, Rfl: 1 .  oxybutynin (DITROPAN-XL) 10 MG 24 hr tablet, Take 1 tablet (10 mg total) by mouth daily., Disp: 90 tablet, Rfl: 3 .  potassium chloride (KLOR-CON 10) 10 MEQ tablet, Take 1 tablet (10 mEq total) by mouth 2 (two) times daily., Disp: 60 tablet, Rfl: 5 .  traMADol (ULTRAM) 50 MG tablet, Take 1-2 tablets (50-100 mg total) by mouth every 6 (six) hours as needed for moderate pain., Disp: 30 tablet, Rfl: 0 .  venlafaxine XR (EFFEXOR-XR) 150 MG 24 hr capsule, take 1 capsule by mouth at bedtime, Disp: 30 capsule, Rfl: 5 .  vitamin C (ASCORBIC ACID) 500 MG tablet, Take 500 mg by mouth every other day. , Disp: , Rfl:  .  vitamin E (VITAMIN E) 1000 UNIT capsule, Take 1,000 Units by mouth every other day. , Disp: , Rfl:  .  benzonatate (TESSALON) 100 MG capsule, Take 1 capsule (100 mg total) by mouth 2 (two) times daily as needed for cough., Disp: 20 capsule, Rfl: 0  EXAM:  Vitals:   07/16/17 1613  BP: 126/70  Pulse: 62  Temp: 98.6 F (37 C)  SpO2: 98%    Body mass index is 20.18 kg/m.  GENERAL: vitals reviewed and listed above, alert, oriented, appears well hydrated and in no acute distress  HEENT: atraumatic, conjunttiva clear, no obvious abnormalities on inspection of external nose and ears, normal appearance of ear canals and TMs, clear nasal congestion,  mild post oropharyngeal erythema with PND, no tonsillar edema or exudate, no sinus TTP  NECK: no obvious masses on inspection  LUNGS: clear to auscultation bilaterally, no wheezes, rales or rhonchi, good air movement  CV: HRRR, no peripheral edema  MS: moves all extremities without noticeable abnormality  PSYCH: pleasant and cooperative, no obvious depression or anxiety  ASSESSMENT AND PLAN:  Discussed the following assessment and plan:  Viral upper respiratory illness  -given HPI and exam findings today, a serious infection or illness is unlikely. We discussed potential etiologies, with VURI being most likely, and advised supportive care and monitoring. Tessalon sent for cough. She does not like taking Tylenol and cannot take anti-inflammatory medications, so suggested tiger mom for aches and pains and minor headaches. We discussed treatment side effects, likely course, antibiotic misuse, transmission, and signs of developing a serious illness. -of course, we advised to return or notify a doctor immediately if symptoms worsen or persist or new concerns arise.    Patient Instructions  INSTRUCTIONS FOR UPPER RESPIRATORY INFECTION:  -plenty of rest and fluids  -nasal saline wash 2-3 times daily (use prepackaged nasal saline or bottled/distilled water if making your own)   -can use AFRIN nasal spray for drainage and nasal congestion if no glaucoma - but do NOT use longer then 3-4 days  -can use tiger balm (topical menthol) for aches and pains - do not get in your eyes  -in the winter time, using a humidifier at night is helpful (please follow cleaning instructions)  -if you are taking a cough medication - use only as directed, may also try a teaspoon of honey to coat the throat and throat lozenges. If given a cough medication with codeine or hydrocodone or other narcotic please be advised that this contains a strong and  potentially addicting medication. Please follow instructions  carefully, take as little as possible and only use AS NEEDED for severe cough. Discuss potential side effects with your pharmacy. Please do not drive or operate machinery while taking these types of medications. Please do not take other sedating medications, drugs or alcohol while taking this medication without discussing with your doctor.  -for sore throat, salt water gargles can help  -follow up if you have fevers, facial pain, tooth pain, difficulty breathing or are worsening or symptoms persist longer then expected  Upper Respiratory Infection, Adult An upper respiratory infection (URI) is also known as the common cold. It is often caused by a type of germ (virus). Colds are easily spread (contagious). You can pass it to others by kissing, coughing, sneezing, or drinking out of the same glass. Usually, you get better in 1 to 3  weeks.  However, the cough can last for even longer. HOME CARE   Only take medicine as told by your doctor. Follow instructions provided above.  Drink enough water and fluids to keep your pee (urine) clear or pale yellow.  Get plenty of rest.  Return to work when your temperature is < 100 for 24 hours or as told by your doctor. You may use a face mask and wash your hands to stop your cold from spreading. GET HELP RIGHT AWAY IF:   After the first few days, you feel you are getting worse.  You have questions about your medicine.  You have chills, shortness of breath, or red spit (mucus).  You have pain in the face for more then 1-2 days, especially when you bend forward.  You have a fever, puffy (swollen) neck, pain when you swallow, or white spots in the back of your throat.  You have a bad headache, ear pain, sinus pain, or chest pain.  You have a high-pitched whistling sound when you breathe in and out (wheezing).  You cough up blood.  You have sore muscles or a stiff neck. MAKE SURE YOU:   Understand these instructions.  Will  watch your  condition.  Will get help right away if you are not doing well or get worse. Document Released: 03/30/2008 Document Revised: 01/04/2012 Document Reviewed: 01/17/2014 [facility] Patient Information 2015 [place], [place]. This information is not intended to replace advice given to you by your health care provider. Make sure you discuss any questions you have with your health care provider.    Kriste Basque R., DO

## 2017-07-16 NOTE — Patient Instructions (Signed)
INSTRUCTIONS FOR UPPER RESPIRATORY INFECTION:  -plenty of rest and fluids  -nasal saline wash 2-3 times daily (use prepackaged nasal saline or bottled/distilled water if making your own)   -can use AFRIN nasal spray for drainage and nasal congestion if no glaucoma - but do NOT use longer then 3-4 days  -can use tiger balm (topical menthol) for aches and pains - do not get in your eyes  -in the winter time, using a humidifier at night is helpful (please follow cleaning instructions)  -if you are taking a cough medication - use only as directed, may also try a teaspoon of honey to coat the throat and throat lozenges. If given a cough medication with codeine or hydrocodone or other narcotic please be advised that this contains a strong and  potentially addicting medication. Please follow instructions carefully, take as little as possible and only use AS NEEDED for severe cough. Discuss potential side effects with your pharmacy. Please do not drive or operate machinery while taking these types of medications. Please do not take other sedating medications, drugs or alcohol while taking this medication without discussing with your doctor.  -for sore throat, salt water gargles can help  -follow up if you have fevers, facial pain, tooth pain, difficulty breathing or are worsening or symptoms persist longer then expected  Upper Respiratory Infection, Adult An upper respiratory infection (URI) is also known as the common cold. It is often caused by a type of germ (virus). Colds are easily spread (contagious). You can pass it to others by kissing, coughing, sneezing, or drinking out of the same glass. Usually, you get better in 1 to 3  weeks.  However, the cough can last for even longer. HOME CARE   Only take medicine as told by your doctor. Follow instructions provided above.  Drink enough water and fluids to keep your pee (urine) clear or pale yellow.  Get plenty of rest.  Return to work when your  temperature is < 100 for 24 hours or as told by your doctor. You may use a face mask and wash your hands to stop your cold from spreading. GET HELP RIGHT AWAY IF:   After the first few days, you feel you are getting worse.  You have questions about your medicine.  You have chills, shortness of breath, or red spit (mucus).  You have pain in the face for more then 1-2 days, especially when you bend forward.  You have a fever, puffy (swollen) neck, pain when you swallow, or white spots in the back of your throat.  You have a bad headache, ear pain, sinus pain, or chest pain.  You have a high-pitched whistling sound when you breathe in and out (wheezing).  You cough up blood.  You have sore muscles or a stiff neck. MAKE SURE YOU:   Understand these instructions.  Will watch your condition.  Will get help right away if you are not doing well or get worse. Document Released: 03/30/2008 Document Revised: 01/04/2012 Document Reviewed: 01/17/2014 [facility] Patient Information 2015 [place], [place]. This information is not intended to replace advice given to you by your health care provider. Make sure you discuss any questions you have with your health care provider.

## 2017-07-21 ENCOUNTER — Other Ambulatory Visit: Payer: Self-pay | Admitting: Family Medicine

## 2017-08-06 ENCOUNTER — Ambulatory Visit (INDEPENDENT_AMBULATORY_CARE_PROVIDER_SITE_OTHER): Payer: Medicare HMO | Admitting: Family Medicine

## 2017-08-06 ENCOUNTER — Encounter: Payer: Self-pay | Admitting: Family Medicine

## 2017-08-06 VITALS — BP 142/72 | HR 62 | Temp 98.4°F | Ht 66.0 in | Wt 129.4 lb

## 2017-08-06 DIAGNOSIS — Z23 Encounter for immunization: Secondary | ICD-10-CM | POA: Diagnosis not present

## 2017-08-06 DIAGNOSIS — R079 Chest pain, unspecified: Secondary | ICD-10-CM

## 2017-08-06 DIAGNOSIS — E0590 Thyrotoxicosis, unspecified without thyrotoxic crisis or storm: Secondary | ICD-10-CM | POA: Diagnosis not present

## 2017-08-06 LAB — COMPREHENSIVE METABOLIC PANEL
ALT: 29 U/L (ref 0–35)
AST: 22 U/L (ref 0–37)
Albumin: 4 g/dL (ref 3.5–5.2)
Alkaline Phosphatase: 69 U/L (ref 39–117)
BILIRUBIN TOTAL: 0.5 mg/dL (ref 0.2–1.2)
BUN: 15 mg/dL (ref 6–23)
CALCIUM: 8.9 mg/dL (ref 8.4–10.5)
CHLORIDE: 106 meq/L (ref 96–112)
CO2: 30 meq/L (ref 19–32)
Creatinine, Ser: 0.89 mg/dL (ref 0.40–1.20)
GFR: 80.46 mL/min (ref >60.00)
Glucose, Bld: 92 mg/dL (ref 70–99)
Potassium: 4.2 mEq/L (ref 3.5–5.1)
Sodium: 140 mEq/L (ref 135–145)
Total Protein: 7.2 g/dL (ref 6.0–8.3)

## 2017-08-06 LAB — CBC
HEMATOCRIT: 37.1 % (ref 36.0–46.0)
HEMOGLOBIN: 12 g/dL (ref 12.0–15.0)
MCHC: 32.4 g/dL (ref 30.0–36.0)
MCV: 93.7 fl (ref 78.0–100.0)
PLATELETS: 221 10*3/uL (ref 150.0–400.0)
RBC: 3.96 Mil/uL (ref 3.87–5.11)
RDW: 18.5 % — ABNORMAL HIGH (ref 11.5–15.5)
WBC: 4.1 10*3/uL (ref 4.0–10.5)

## 2017-08-06 LAB — T4, FREE: FREE T4: 0.43 ng/dL — AB (ref 0.60–1.60)

## 2017-08-06 LAB — TSH: TSH: 7.08 u[IU]/mL — ABNORMAL HIGH (ref 0.35–4.50)

## 2017-08-06 LAB — T3, FREE: T3 FREE: 2.5 pg/mL (ref 2.3–4.2)

## 2017-08-06 NOTE — Progress Notes (Signed)
Subjective:  [name] is a 71 y.o. year old very pleasant female patient who presents for/with See problem oriented charting ROS- no fever, chills, congestion. Mild cough. Some chest tightness and shortness of breath.   Past Medical History-  Patient Active Problem List   Diagnosis Date Noted  . Hyperthyroidism 05/22/2017    Priority: High  . Migraine 09/19/2014    Priority: High  . CAD (coronary artery disease)     Priority: High  . Seizures (HCC)     Priority: High  . Protein-calorie malnutrition, moderate (HCC) 05/22/2017    Priority: Medium  . Overactive bladder 09/19/2014    Priority: Medium  . Hyperglycemia 09/19/2014    Priority: Medium  . Hypercholesteremia     Priority: Medium  . GERD (gastroesophageal reflux disease) 10/04/2011    Priority: Medium  . HTN (hypertension) 10/01/2011    Priority: Medium  . Hypokalemia 06/01/2014    Priority: Low  . Arthritis     Priority: Low  . Demand ischemia (HCC)   . Elevated lactic acid level 05/22/2017  . Dark stools 05/22/2017  . Hypomagnesemia 05/22/2017  . Hypercalcemia 05/22/2017  . Subacute thyroiditis 05/22/2017    Medications- reviewed and updated Current Outpatient Prescriptions  Medication Sig Dispense Refill  . acetaminophen (TYLENOL) 325 MG tablet Take 650 mg by mouth every 6 (six) hours as needed for moderate pain.     . Aloe Vera 25 MG CAPS Take 25 mg by mouth daily.     Marland Kitchen amLODipine (NORVASC) 10 MG tablet take 1 tablet by mouth once daily 30 tablet 5  . aspirin EC 81 MG EC tablet Take 1 tablet (81 mg total) by mouth daily.    Marland Kitchen atenolol (TENORMIN) 50 MG tablet Take 1 tablet (50 mg total) by mouth daily. 60 tablet 2  . atorvastatin (LIPITOR) 20 MG tablet take 1 tablet by mouth once daily 90 tablet 1  . cholecalciferol (VITAMIN D) 400 UNITS TABS tablet Take 400 Units by mouth every other day.     . Cyanocobalamin (VITAMIN B 12 PO) Take 1 capsule by mouth every other day.     . esomeprazole (NEXIUM) 40 MG  capsule Take 1 capsule (40 mg total) by mouth daily. 90 capsule 3  . ezetimibe (ZETIA) 10 MG tablet take 1 tablet by mouth once daily 30 tablet 5  . feeding supplement, ENSURE ENLIVE, (ENSURE ENLIVE) LIQD Take 237 mLs by mouth 2 (two) times daily between meals. 237 mL 12  . ferrous sulfate 325 (65 FE) MG tablet Take 1 tablet (325 mg total) by mouth daily. 30 tablet 0  . isosorbide mononitrate (IMDUR) 60 MG 24 hr tablet take 2 tablets by mouth once daily 60 tablet 5  . lisinopril (PRINIVIL,ZESTRIL) 20 MG tablet take 1 tablet by mouth once daily 90 tablet 1  . methimazole (TAPAZOLE) 10 MG tablet Take 4 tablets (40 mg total) by mouth 2 (two) times daily. 240 tablet 1  . nitroGLYCERIN (NITROSTAT) 0.4 MG SL tablet PLACE 1 TABLET UNDER THE TONGUE EVERY 5 MINUTES FOR CHEST PAIN FOR 3 DOSES 25 tablet 1  . oxybutynin (DITROPAN-XL) 10 MG 24 hr tablet Take 1 tablet (10 mg total) by mouth daily. 90 tablet 3  . potassium chloride (KLOR-CON 10) 10 MEQ tablet Take 1 tablet (10 mEq total) by mouth 2 (two) times daily. 60 tablet 5  . traMADol (ULTRAM) 50 MG tablet Take 1-2 tablets (50-100 mg total) by mouth every 6 (six) hours as needed for moderate  pain. 30 tablet 0  . venlafaxine XR (EFFEXOR-XR) 150 MG 24 hr capsule take 1 capsule by mouth at bedtime 30 capsule 5  . vitamin C (ASCORBIC ACID) 500 MG tablet Take 500 mg by mouth every other day.     . vitamin E (VITAMIN E) 1000 UNIT capsule Take 1,000 Units by mouth every other day.      No current facility-administered medications for this visit.     Objective: BP (!) 142/72 (BP Location: Left Arm, Patient Position: Sitting, Cuff Size: Normal)   Pulse 62   Temp 98.4 F (36.9 C) (Oral)   Ht  (1.676 m)   Wt 129 lb 6.4 oz (58.7 kg)   SpO2 98%   BMI 20.89 kg/m  Gen: NAD, resting comfortably CV: RRR no murmurs rubs or gallops Lungs: CTAB no crackles, wheeze, rhonchi Abdomen: soft/nontender/nondistended/normal bowel sounds. No rebound or guarding.   Ext: no edema Skin: warm, dry Neuro: grossly normal, moves all extremities   EKG: Rate 56, sinus bradycardia, normal axis, short pr but otherwise normal intervals. Poor baseline- no clear st or t wave changes. Listed on reading as nonspecific st depression but I do not corroborate that on my viewing of EKG. Compared to EKG 05/23/17 no longer see depression in inferior leads  Assessment/Plan:  Chest pain, unspecified type - Plan: EKG 12-Lead, CBC, Comprehensive metabolic panel, Ambulatory referral to Cardiology Hyperthyroidism - Plan: TSH, T4, free, T3, free, Ambulatory referral to Endocrinology  S:  was started on atenolol  twice a day when discharged from hospital 05/22/17- she had been on carvedilol 6.25mg  BID. Patient comes in requesting changing back to prior medicine as she has not been feeling well. Also requests cardiology consultation.   Feeling more short of breath, tightness in chest, gets some pain into left arm-- these symptoms have been present for last 3 weeks and seems to be getting worse. In July had heart catheterization with nonobstructive CAD- had troponin elevation thought to be demand ischemia from thyroid issues.   Has seen Dr. Everardo All for hyperthyroidism/Graves but did not follow up as instructed. Is taking methimazole. No thyroid tenderness at present.  A/P: 89F with chest pain and history Graves disease on hyperthyroidism. I do not think simply switching back to coreg from atenolol from coreg will resolve issues. Also I told her I was concerned about switch as atenolol can help more with hyperthyroidism symptoms. I have referred her back to endocrinology and cardiology. Did EKG today which was pretty stable- discussed if she has new or worsening symptoms to seek care in ER immediately- she is in agreement. I did not think ugent cardiology eval indicated given recent catheterization but would like her to be seen soon so placed referral- also referred back to endocrine  thoguh technically does not need referral.   Asked her to update me early next week regardless. I am having her call endo and cards but we will also call to try to facilitate getting her in. Check labs with tsh, t3, t4. History of anemia which could produce chest pain if severe- check CBC. She has missed some iron due to constipation- advised try slow release or stool softener.   Orders Placed This Encounter  Procedures  . Flu vaccine HIGH DOSE PF  . CBC    [place]  . Comprehensive metabolic panel    [place]  . TSH    [place]  . T4, free    [place]  . T3, free  . Ambulatory referral to  Endocrinology    Referral Priority:   Routine    Referral Type:   Consultation    Referral Reason:   Specialty Services Required    Number of Visits Requested:   1  . Ambulatory referral to Cardiology    Referral Priority:   Routine    Referral Type:   Consultation    Referral Reason:   Specialty Services Required    Requested Specialty:   Cardiology    Number of Visits Requested:   1  . EKG 12-Lead   The duration of face-to-face time during this visit was greater than 25 minutes. Greater than 50% of this time was spent in counseling, explanation of diagnosis, planning of further management, and/or coordination of care including reviewing EKG, discussing with patient fact there could be other issues than her beta blocker switch, discussing planned workup, follow up precautions.    Return precautions advised.  Tana Conch, MD

## 2017-08-06 NOTE — Patient Instructions (Signed)
We will call you within a week or two about your referral to cardiology and endocrinology. If you do not hear within 3 weeks, give [place] a call.   I am hesitant to change your atenolol given your thyroid issues without their input and I do not think its clear that is the issue  If you have new or worsening symptoms- please call 911 or go to the ER.   Please stop by lab before you go

## 2017-08-12 ENCOUNTER — Other Ambulatory Visit: Payer: Self-pay | Admitting: Endocrinology

## 2017-08-25 ENCOUNTER — Ambulatory Visit (INDEPENDENT_AMBULATORY_CARE_PROVIDER_SITE_OTHER): Payer: Medicare HMO | Admitting: Endocrinology

## 2017-08-25 ENCOUNTER — Encounter: Payer: Self-pay | Admitting: Endocrinology

## 2017-08-25 VITALS — BP 128/68 | HR 68 | Wt 131.2 lb

## 2017-08-25 DIAGNOSIS — E0590 Thyrotoxicosis, unspecified without thyrotoxic crisis or storm: Secondary | ICD-10-CM

## 2017-08-25 LAB — T4, FREE: FREE T4: 0.22 ng/dL — AB (ref 0.60–1.60)

## 2017-08-25 LAB — TSH: TSH: 28.25 u[IU]/mL — ABNORMAL HIGH (ref 0.35–4.50)

## 2017-08-25 NOTE — Progress Notes (Signed)
Subjective:    Patient ID: [name], female    DOB: [birth year], 71 y.o.   MRN: [id number]  HPI Pt returns for f/u of hyperthyroidism (dx'ed 2012; [place] was c/w Grave's Dz; tapazole was chosen as rx' due to 2018 MI).  She takes tapazole as rx'ed.  pt states she feels well in general. Past Medical History:  Diagnosis Date  . Arthritis   . Chronic lower back pain   . Cluster headache   . Coronary artery disease    a. 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 non obs dz LHC 2007. c. Non obs dz (mild-mod) by [facility] 05/17/14.   . Diverticulitis    a. Hx microperf 2012 - hospitalizated.  Marland Kitchen GERD (gastroesophageal reflux disease)   . Hypercholesteremia   . Hypertension   . PVC's (premature ventricular contractions)    a. Hx of trigeminy/bigeminy.  . Seizures (HCC)   . UTI (lower urinary tract infection)     Past Surgical History:  Procedure Laterality Date  . ABDOMINAL HYSTERECTOMY  1987   "partial"-still has ovaries  . CARDIAC CATHETERIZATION  05/17/2014  . CORONARY ANGIOPLASTY WITH STENT PLACEMENT  1995   "1"  . JOINT REPLACEMENT     right knee  . LEFT HEART CATH AND CORONARY ANGIOGRAPHY N/A 05/26/2017   Procedure: Left Heart Cath and Coronary Angiography;  Surgeon: Corky Crafts, MD;  [facility] INVASIVE CV LAB;  Service: Cardiovascular;  Laterality: N/A;  . LEFT HEART CATHETERIZATION WITH CORONARY ANGIOGRAM N/A 05/17/2014   Procedure: LEFT HEART CATHETERIZATION WITH CORONARY ANGIOGRAM;  Surgeon: Marykay Lex, MD;  Location[facility] C[facility] LAB;  Service: Cardiovascular;  Laterality: N/A;  . TOTAL KNEE ARTHROPLASTY Right 2005  . TUBAL LIGATION  1970  . VASCULAR SURGERY      Social History   Social History  . Marital status: Married    Spouse name: N/A  . Number of children: 4  . Years of education: 4   Occupational History  .      retired   Social History Main Topics  . Smoking status: Never Smoker  . Smokeless tobacco: Never Used  . Alcohol use No  . Drug use: No  . Sexual  activity: No   Other Topics Concern  . Not on file   Social History Narrative   Separated. 4 children from first marriage. 16 grandkids.       Retired from [employer] for 25 years, went to [employer] for 5 years. Retired 2003 after MI      Hobbies: babysit/active with children      Right-handed      Caffeine: 24 oz soda per day          Current Outpatient Prescriptions on File Prior to Visit  Medication Sig Dispense Refill  . acetaminophen (TYLENOL) 325 MG tablet Take 650 mg by mouth every 6 (six) hours as needed for moderate pain.     . Aloe Vera 25 MG CAPS Take 25 mg by mouth daily.     Marland Kitchen amLODipine (NORVASC) 10 MG tablet take 1 tablet by mouth once daily 30 tablet 5  . aspirin EC 81 MG EC tablet Take 1 tablet (81 mg total) by mouth daily.    Marland Kitchen atenolol (TENORMIN) 50 MG tablet Take 1 tablet (50 mg total) by mouth daily. 60 tablet 2  . atorvastatin (LIPITOR) 20 MG tablet take 1 tablet by mouth once daily 90 tablet 1  . cholecalciferol (VITAMIN D) 400 UNITS TABS tablet Take 400  Units by mouth every other day.     . Cyanocobalamin (VITAMIN B 12 PO) Take 1 capsule by mouth every other day.     . esomeprazole (NEXIUM) 40 MG capsule Take 1 capsule (40 mg total) by mouth daily. 90 capsule 3  . ezetimibe (ZETIA) 10 MG tablet take 1 tablet by mouth once daily 30 tablet 5  . feeding supplement, ENSURE ENLIVE, (ENSURE ENLIVE) LIQD Take 237 mLs by mouth 2 (two) times daily between meals. 237 mL 12  . ferrous sulfate 325 (65 FE) MG tablet Take 1 tablet (325 mg total) by mouth daily. 30 tablet 0  . isosorbide mononitrate (IMDUR) 60 MG 24 hr tablet take 2 tablets by mouth once daily 60 tablet 5  . lisinopril (PRINIVIL,ZESTRIL) 20 MG tablet take 1 tablet by mouth once daily 90 tablet 1  . nitroGLYCERIN (NITROSTAT) 0.4 MG SL tablet PLACE 1 TABLET UNDER THE TONGUE EVERY 5 MINUTES FOR CHEST PAIN FOR 3 DOSES 25 tablet 1  . oxybutynin (DITROPAN-XL) 10 MG 24 hr tablet Take 1 tablet (10 mg total) by mouth  daily. 90 tablet 3  . potassium chloride (KLOR-CON 10) 10 MEQ tablet Take 1 tablet (10 mEq total) by mouth 2 (two) times daily. 60 tablet 5  . traMADol (ULTRAM) 50 MG tablet Take 1-2 tablets (50-100 mg total) by mouth every 6 (six) hours as needed for moderate pain. 30 tablet 0  . venlafaxine XR (EFFEXOR-XR) 150 MG 24 hr capsule take 1 capsule by mouth at bedtime 30 capsule 5  . vitamin C (ASCORBIC ACID) 500 MG tablet Take 500 mg by mouth every other day.     . vitamin E (VITAMIN E) 1000 UNIT capsule Take 1,000 Units by mouth every other day.      No current facility-administered medications on file prior to visit.     Allergies  Allergen Reactions  . Sulfa Drugs Cross Reactors Shortness Of Breath and Palpitations    Family History  Problem Relation Age of Onset  . CAD Brother   . Diabetes Brother   . CAD Father   . Diabetes Father   . Diabetes Mother        father, sister, brothers  . Diabetes Sister   . Thyroid disease Sister     BP 128/68   Pulse 68   Wt 131 lb 3.2 oz (59.5 kg)   SpO2 98%   BMI 21.18 kg/m   Review of Systems Denies fever.     Objective:   Physical Exam VS: see vs page.   GEN: no distress.  NECK: multinodular goiter is palpated (R>L).      Assessment & Plan:  Hyperthyroidism: she appears to have both Grave's Dz and multinodular goiter.  Due for recheck.    Patient Instructions  blood tests are requested for you today.  We'll let you know about the results. Based on there results, I'll prescribe an amount of methimazole for you.  If ever you have fever while taking methimazole, stop it and call us, even if the reason is obvious, because of the risk of a rare side-effect.  Please come back for a follow-up appointment in 1 month.

## 2017-08-25 NOTE — Patient Instructions (Addendum)
blood tests are requested for you today.  We'll let you know about the results. Based on there results, I'll prescribe an amount of methimazole for you.  If ever you have fever while taking methimazole, stop it and call us, even if the reason is obvious, because of the risk of a rare side-effect.  Please come back for a follow-up appointment in 1 month.

## 2017-08-30 ENCOUNTER — Encounter: Payer: Self-pay | Admitting: Physician Assistant

## 2017-08-30 NOTE — Progress Notes (Signed)
Cardiology Office Note    Date:  09/02/2017   ID:  [name], DOB 05/02/1946, MRN [id number]  PCP:  [name], Katherine O, MD  Cardiologist:  Dr. Katrinka BlazingSmith   Chief Complaint: Chest pain   History of Present Illness:   [name] is a 71 y.Reynolds. female with hx of CAD with coronary spasm, HTN, HLD, Grave's Dz and multinodular goiter (followed by Dr. Haynes KernsEllision) presents for chest pain.   Admitted 05/22/17-05/27/17 subacute thyroiditis and NSTEMI with peak of troponin 0.27. No PE on CTA of chest. Echo showed LVEF 55%, possible hypokinesis in 1 segment. Baseline EKG abnormal. Abnormal stress test with small ischemia in the apical anterior and moderate ischemia in the basal and mid inferior walls. Cath showed mild nonobstructive CAD, elevated troponin sec to demand ischemia.   Seen by PCP 08/06/17 for atypical chest pain and referred to cardiology.   Here today for follow up.  Patient is requesting switching atenolol to Coreg as she has not felt any difference.  She takes aspirin 325mg  daily, approximately 5 days/week due to headache.  She has intermittent dyspnea with and without chest discomfort.  Symptoms resolved spontaneously with nitroglycerin.  This has been stable.  Denies orthopnea, PND, syncope, dizziness, melena or blood in her stool or urine.   Past Medical History:  Diagnosis Date  . Arthritis   . Chronic lower back pain   . Cluster headache   . Coronary artery disease    a. 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 non obs dz LHC 2007. c. [facility] d. cath 05/26/17 mild non obstructive CAD  . Diverticulitis    a. Hx microperf 2012 - hospitalizated.  Marland Kitchen. GERD (gastroesophageal reflux disease)   . Hypercholesteremia   . Hypertension   . PVC's (premature ventricular contractions)    a. Hx of trigeminy/bigeminy.  . Seizures (HCC)   . UTI (lower urinary tract infection)     Past Surgical History:  Procedure Laterality Date  . ABDOMINAL HYSTERECTOMY  1987   "partial"-still  has ovaries  . CARDIAC CATHETERIZATION  05/17/2014  . CORONARY ANGIOPLASTY WITH STENT PLACEMENT  1995   "1"  . JOINT REPLACEMENT     right knee  . TOTAL KNEE ARTHROPLASTY Right 2005  . TUBAL LIGATION  1970  . VASCULAR SURGERY      Current Medications: Prior to Admission medications   Medication Sig Start Date End Date Taking? Authorizing Provider  acetaminophen (TYLENOL) 325 MG tablet Take 650 mg by mouth every 6 (six) hours as needed for moderate pain.     [provider]  Aloe Vera 25 MG CAPS Take 25 mg by mouth daily.     [provider]  amLODipine (NORVASC) 10 MG tablet take 1 tablet by mouth once daily 06/15/17   [name], Katherine O, MD  aspirin EC 81 MG EC tablet Take 1 tablet (81 mg total) by mouth daily. 05/18/14   Janetta Horahompson, Katherine R, PA-C  atenolol (TENORMIN) 50 MG tablet Take 1 tablet (50 mg total) by mouth daily. 05/27/17   Richarda OverlieAbrol, Nayana, MD  atorvastatin (LIPITOR) 20 MG tablet take 1 tablet by mouth once daily 05/06/17   [name], Katherine O, MD  cholecalciferol (VITAMIN D) 400 UNITS TABS tablet Take 400 Units by mouth every other day.     [provider]  Cyanocobalamin (VITAMIN B 12 PO) Take 1 capsule by mouth every other day.     [provider]  esomeprazole (NEXIUM) 40 MG capsule  Take 1 capsule (40 mg total) by mouth daily. 11/19/16   Katherine Majestic, MD  ezetimibe (ZETIA) 10 MG tablet take 1 tablet by mouth once daily 07/21/17   Katherine Majestic, MD  feeding supplement, ENSURE ENLIVE, (ENSURE ENLIVE) LIQD Take 237 mLs by mouth 2 (two) times daily between meals. 05/25/17   Richarda Overlie, MD  ferrous sulfate 325 (65 FE) MG tablet Take 1 tablet (325 mg total) by mouth daily. 05/21/17   Jacalyn Lefevre, MD  isosorbide mononitrate (IMDUR) 60 MG 24 hr tablet take 2 tablets by mouth once daily 07/21/17   Katherine Majestic, MD  lisinopril (PRINIVIL,ZESTRIL) 20 MG tablet take 1 tablet by mouth once daily 04/05/17   Katherine Majestic, MD  nitroGLYCERIN  (NITROSTAT) 0.4 MG SL tablet PLACE 1 TABLET UNDER THE TONGUE EVERY 5 MINUTES FOR CHEST PAIN FOR 3 DOSES 07/12/17   Katherine Majestic, MD  oxybutynin (DITROPAN-XL) 10 MG 24 hr tablet Take 1 tablet (10 mg total) by mouth daily. 11/19/16   Katherine Majestic, MD  potassium chloride (KLOR-CON 10) 10 MEQ tablet Take 1 tablet (10 mEq total) by mouth 2 (two) times daily. 06/04/17   Katherine Majestic, MD  traMADol (ULTRAM) 50 MG tablet Take 1-2 tablets (50-100 mg total) by mouth every 6 (six) hours as needed for moderate pain. 05/27/17   Richarda Overlie, MD  venlafaxine XR (EFFEXOR-XR) 150 MG 24 hr capsule take 1 capsule by mouth at bedtime 06/15/17   Katherine Majestic, MD  vitamin C (ASCORBIC ACID) 500 MG tablet Take 500 mg by mouth every other day.     [provider]  vitamin E (VITAMIN E) 1000 UNIT capsule Take 1,000 Units by mouth every other day.     [provider]    Allergies:   Sulfa drugs cross reactors   Social History   Socioeconomic History  . Marital status: Married    Spouse name: None  . Number of children: 4  . Years of education: 4  . Highest education level: None  Social Needs  . Financial resource strain: None  . Food insecurity - worry: None  . Food insecurity - inability: None  . Transportation needs - medical: None  . Transportation needs - non-medical: None  Occupational History    Comment: retired  Tobacco Use  . Smoking status: Never Smoker  . Smokeless tobacco: Never Used  Substance and Sexual Activity  . Alcohol use: No  . Drug use: No  . Sexual activity: No  Other Topics Concern  . None  Social History Narrative   Separated. 4 children from first marriage. 16 grandkids.       Retired from [employer] for 25 years, went to [employer] for 5 years. Retired 2003 after MI      Hobbies: babysit/active with children      Right-handed      Caffeine: 24 oz soda per day        Family History:  The patient's family history includes CAD in her brother and  father; Diabetes in her brother, father, mother, and sister; Thyroid disease in her sister.   ROS:   Please see the history of present illness.    ROS All other systems reviewed and are negative.   PHYSICAL EXAM:   VS:  BP 122/70   Pulse 71   Ht 5\' 6"  (1.676 m)   Wt 132 lb (59.9 kg)   SpO2 98%   BMI 21.31 kg/m  GEN: Well nourished, well developed, in no acute distress  HEENT: normal  Neck: no JVD, carotid bruits, or masses Cardiac: RRR; no murmurs, rubs, or gallops,no edema  Respiratory:  clear to auscultation bilaterally, normal work of breathing GI: soft, nontender, nondistended, + BS MS: no deformity or atrophy  Skin: warm and dry, no rash Neuro:  Alert and Oriented x 3, Strength and sensation are intact Psych: euthymic mood, full affect  Wt Readings from Last 3 Encounters:  09/02/17 132 lb (59.9 kg)  08/25/17 131 lb 3.2 oz (59.5 kg)  08/06/17 129 lb 6.4 oz (58.7 kg)      Studies/Labs Reviewed:   EKG:  EKG is not  ordered today.    Recent Labs: 05/10/2017: Pro B Natriuretic peptide (BNP) 289.0 05/22/2017: B Natriuretic Peptide 900.5 05/24/2017: Magnesium 1.9 08/06/2017: ALT 29; BUN 15; Creatinine, Ser 0.89; Hemoglobin 12.0; Platelets 221.0; Potassium 4.2; Sodium 140 08/25/2017: TSH 28.25   Lipid Panel    Component Value Date/Time   CHOL 75 05/23/2017 0514   TRIG 58 05/23/2017 0514   HDL 25 (L) 05/23/2017 0514   CHOLHDL 3.0 05/23/2017 0514   VLDL 12 05/23/2017 0514   LDLCALC 38 05/23/2017 0514    Additional studies/ records that were reviewed today include:   Echocardiogram: 05/23/17 Left ventricle: The cavity size was normal. Wall thickness was   increased in a pattern of mild LVH. The estimated ejection   fraction was 55%. There is mild hypokinesis of the basalinferior   myocardium. Features are consistent with a pseudonormal left   ventricular filling pattern, with concomitant abnormal relaxation   and increased filling pressure (grade 2 diastolic  dysfunction). - Aortic valve: Mildly calcified annulus. Trileaflet. - Mitral valve: Mildly thickened leaflets . There was mild to   moderate regurgitation. - Left atrium: The atrium was severely dilated. - Right atrium: Central venous pressure (est): 8 mm Hg. - Atrial septum: No defect or patent foramen ovale was identified. - Tricuspid valve: There was mild regurgitation. - Pulmonary arteries: PA peak pressure: 46 mm Hg (S). - Pericardium, extracardiac: There was no pericardial effusion.  Impressions:  - Mild LVH with LVEF approximately 55%. Mild hypokinesis of the   basal inferior wall. Grade 2 diastolic dysfunction. Severe left   atrial enlargement. Mildly thickened mitral leaflets with mild to   moderate mitral regurgitation. Mild tricuspid regurgitation with   PASP estimated 46 mmHg.  Cardiac Catheterization: 05/26/17 Left Heart Cath and Coronary Angiography  Conclusion     Prox RCA to Mid RCA lesion, 10 %stenosed.  Dist Cx lesion, 20 %stenosed.  Mid LAD lesion, 10 %stenosed.  There is no aortic valve stenosis.  Ost 1st Mrg lesion, 25 %stenosed.  The left ventricular systolic function is normal.  LV end diastolic pressure is normal.  The left ventricular ejection fraction is 55-65% by visual estimate.   No obstructive CAD.  Likely demand ischemia.  Continue preventive therapy.        ASSESSMENT & PLAN:    1. Mild non obstructive CAD with prior document coronary spasm - Cath cath 05/26/17 as above, nonobstructive CAD.  She is required to take nitroglycerin once or twice a week for likely coronary spasm.  This  has been stable.  No change in medication.  Continue amlodipine 10 mg daily, Imdur 120mg  daily and atenolol 50 mg daily.  Advised to discuss changing atenolol to Coreg with endocrinologist. This was changed during admission 05/2017 for hypothyroidism.  Encouraged to take aspirin 81 mg daily and  Tylenol as needed for headache, encourage further discussion  with primary care provider for long-term management.  2. HTN -Blood pressure stable and well controlled on current regimen.  3. HLD - 05/23/2017: Cholesterol 75; HDL 25; LDL Cholesterol 38; Triglycerides 58; VLDL 12  -Continue Lipitor.  4. Graves disease on hyperthyroidism - Per Dr. Everardo All   Medication Adjustments/Labs and Tests Ordered: Current medicines are reviewed at length with the patient today.  Concerns regarding medicines are outlined above.  Medication changes, Labs and Tests ordered today are listed in the Patient Instructions below. Patient Instructions  Medication Instructions: Your physician has recommended you make the following change in your medication:  -1) DECREASE Aspirin - take 1 tablet (81 mg) by mouth daily   Labwork: None Ordered  Procedures/Testing: None Ordered  Follow-Up: Your physician recommends that you schedule a follow-up appointment in: 3 MONTHS with Dr. Katrinka Blazing  Any Additional Special Instructions Will Be Listed Below (If Applicable).  --Please follow up with your Primary Care Physician in regards to your headaches and what are the proper medications to take for them.   If you need a refill on your cardiac medications before your next appointment, please call your pharmacy.      Lorelei Pont, [place]  09/02/2017 8:34 AM    [facility] [address] [place], [place], [place]  [address] Phone: [phone]; Fax: [phone]

## 2017-09-02 ENCOUNTER — Ambulatory Visit: Payer: Medicare HMO | Admitting: Physician Assistant

## 2017-09-02 ENCOUNTER — Encounter: Payer: Self-pay | Admitting: Physician Assistant

## 2017-09-02 VITALS — BP 122/70 | HR 71 | Ht 66.0 in | Wt 132.0 lb

## 2017-09-02 DIAGNOSIS — E782 Mixed hyperlipidemia: Secondary | ICD-10-CM | POA: Diagnosis not present

## 2017-09-02 DIAGNOSIS — E0590 Thyrotoxicosis, unspecified without thyrotoxic crisis or storm: Secondary | ICD-10-CM

## 2017-09-02 DIAGNOSIS — I2510 Atherosclerotic heart disease of native coronary artery without angina pectoris: Secondary | ICD-10-CM | POA: Diagnosis not present

## 2017-09-02 DIAGNOSIS — I10 Essential (primary) hypertension: Secondary | ICD-10-CM | POA: Diagnosis not present

## 2017-09-02 DIAGNOSIS — I25111 Atherosclerotic heart disease of native coronary artery with angina pectoris with documented spasm: Secondary | ICD-10-CM

## 2017-09-02 NOTE — Patient Instructions (Addendum)
Medication Instructions: Your physician has recommended you make the following change in your medication:  -1) DECREASE Aspirin - take 1 tablet (81 mg) by mouth daily   Labwork: None Ordered  Procedures/Testing: None Ordered  Follow-Up: Your physician recommends that you schedule a follow-up appointment in: 3 MONTHS with Dr. Katrinka BlazingSmith  Any Additional Special Instructions Will Be Listed Below (If Applicable).  --Please follow up with your Primary Care Physician in regards to your headaches and what are the proper medications to take for them.   If you need a refill on your cardiac medications before your next appointment, please call your pharmacy.

## 2017-09-13 ENCOUNTER — Other Ambulatory Visit (INDEPENDENT_AMBULATORY_CARE_PROVIDER_SITE_OTHER): Payer: Medicare HMO

## 2017-09-13 ENCOUNTER — Other Ambulatory Visit: Payer: Self-pay | Admitting: Endocrinology

## 2017-09-13 DIAGNOSIS — E0590 Thyrotoxicosis, unspecified without thyrotoxic crisis or storm: Secondary | ICD-10-CM

## 2017-09-13 LAB — TSH: TSH: 0.02 u[IU]/mL — AB (ref 0.35–4.50)

## 2017-09-13 LAB — T4, FREE: FREE T4: 3.22 ng/dL — AB (ref 0.60–1.60)

## 2017-09-13 MED ORDER — METHIMAZOLE 10 MG PO TABS: 10.0000 mg | ORAL_TABLET | Freq: Two times a day (BID) | ORAL | 5 refills | Status: DC

## 2017-09-24 ENCOUNTER — Ambulatory Visit (INDEPENDENT_AMBULATORY_CARE_PROVIDER_SITE_OTHER): Payer: Medicare HMO | Admitting: Endocrinology

## 2017-09-24 ENCOUNTER — Encounter: Payer: Self-pay | Admitting: Endocrinology

## 2017-09-24 VITALS — BP 112/50 | HR 76 | Wt 123.4 lb

## 2017-09-24 DIAGNOSIS — E0590 Thyrotoxicosis, unspecified without thyrotoxic crisis or storm: Secondary | ICD-10-CM | POA: Diagnosis not present

## 2017-09-24 LAB — T4, FREE: Free T4: 3.79 ng/dL — ABNORMAL HIGH (ref 0.60–1.60)

## 2017-09-24 LAB — TSH: TSH: <0.01 u[IU]/mL — ABNORMAL LOW (ref 0.35–4.50)

## 2017-09-24 NOTE — Progress Notes (Signed)
Subjective:    Patient ID: Katherine ShamsCary Butler, female    DOB: 11/04/1945, 71 y.o.   MRN: [id number]  HPI Pt returns for f/u of hyperthyroidism (dx'ed 2012; US was c/w Grave's Dz; tapazole was chosen as rx' due to 2018 MI).  She takes tapazole as rx'ed.  pt reports slight tremor and palpitations.  Past Medical History:  Diagnosis Date  . Arthritis   . Chronic lower back pain   . Cluster headache   . Coronary artery disease    a. 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 non obs dz LHC 2007. c. [facility] d. cath 05/26/17 mild non obstructive CAD  . Diverticulitis    a. Hx microperf 2012 - hospitalizated.  Marland Kitchen. GERD (gastroesophageal reflux disease)   . Hypercholesteremia   . Hypertension   . PVC's (premature ventricular contractions)    a. Hx of trigeminy/bigeminy.  . Seizures (HCC)   . UTI (lower urinary tract infection)     Past Surgical History:  Procedure Laterality Date  . ABDOMINAL HYSTERECTOMY  1987   "partial"-still has ovaries  . CARDIAC CATHETERIZATION  05/17/2014  . CORONARY ANGIOPLASTY WITH STENT PLACEMENT  1995   "1"  . JOINT REPLACEMENT     right knee  . LEFT HEART CATH AND CORONARY ANGIOGRAPHY N/A 05/26/2017   Procedure: Left Heart Cath and Coronary Angiography;  Surgeon: Corky CraftsVaranasi, Jayadeep S, MD;  [facility];  Service: Cardiovascular;  Laterality: N/A;  . LEFT HEART CATHETERIZATION WITH CORONARY ANGIOGRAM N/A 05/17/2014   Procedure: LEFT HEART CATHETERIZATION WITH CORONARY ANGIOGRAM;  Surgeon: Marykay Lexavid W Harding, MD;  [facility];  Service: Cardiovascular;  Laterality: N/A;  . TOTAL KNEE ARTHROPLASTY Right 2005  . TUBAL LIGATION  1970  . VASCULAR SURGERY      Social History   Socioeconomic History  . Marital status: Married    Spouse name: Not on file  . Number of children: 4  . Years of education: 4  . Highest education level: Not on file  Social Needs  . Financial resource strain: Not on file  . Food insecurity - worry: Not on  file  . Food insecurity - inability: Not on file  . Transportation needs - medical: Not on file  . Transportation needs - non-medical: Not on file  Occupational History    Comment: retired  Tobacco Use  . Smoking status: Never Smoker  . Smokeless tobacco: Never Used  Substance and Sexual Activity  . Alcohol use: No  . Drug use: No  . Sexual activity: No  Other Topics Concern  . Not on file  Social History Narrative   Separated. 4 children from first marriage. 16 grandkids.       Retired from [employer] for 25 years, went to [employer] for 5 years. Retired 2003 after MI      Hobbies: babysit/active with children      Right-handed      Caffeine: 24 oz soda per day       Current Outpatient Medications on File Prior to Visit  Medication Sig Dispense Refill  . acetaminophen (TYLENOL) 325 MG tablet Take 650 mg by mouth every 6 (six) hours as needed for moderate pain.     . Aloe Vera 25 MG CAPS Take 25 mg by mouth daily.     Marland Kitchen. amLODipine (NORVASC) 10 MG tablet take 1 tablet by mouth once daily 30 tablet 5  . aspirin EC 81 MG tablet Take 81 mg  daily by mouth.    Marland Kitchen. atenolol (TENORMIN) 50 MG tablet Take 1 tablet (50 mg total) by mouth daily. 60 tablet 2  . atorvastatin (LIPITOR) 20 MG tablet take 1 tablet by mouth once daily 90 tablet 1  . cholecalciferol (VITAMIN D) 400 UNITS TABS tablet Take 400 Units daily by mouth.     . Cyanocobalamin (VITAMIN B 12 PO) Take 1 capsule daily by mouth.     . esomeprazole (NEXIUM) 40 MG capsule Take 1 capsule (40 mg total) by mouth daily. 90 capsule 3  . ezetimibe (ZETIA) 10 MG tablet take 1 tablet by mouth once daily 30 tablet 5  . feeding supplement, ENSURE ENLIVE, (ENSURE ENLIVE) LIQD Take 237 mLs by mouth 2 (two) times daily between meals. 237 mL 12  . ferrous sulfate 325 (65 FE) MG tablet Take 1 tablet (325 mg total) by mouth daily. 30 tablet 0  . isosorbide mononitrate (IMDUR) 60 MG 24 hr tablet take 2 tablets by mouth once daily 60 tablet 5  .  lisinopril (PRINIVIL,ZESTRIL) 20 MG tablet take 1 tablet by mouth once daily 90 tablet 1  . nitroGLYCERIN (NITROSTAT) 0.4 MG SL tablet PLACE 1 TABLET UNDER THE TONGUE EVERY 5 MINUTES FOR CHEST PAIN FOR 3 DOSES 25 tablet 1  . oxybutynin (DITROPAN-XL) 10 MG 24 hr tablet Take 1 tablet (10 mg total) by mouth daily. 90 tablet 3  . potassium chloride (KLOR-CON 10) 10 MEQ tablet Take 1 tablet (10 mEq total) by mouth 2 (two) times daily. 60 tablet 5  . traMADol (ULTRAM) 50 MG tablet Take 1-2 tablets (50-100 mg total) by mouth every 6 (six) hours as needed for moderate pain. 30 tablet 0  . venlafaxine XR (EFFEXOR-XR) 150 MG 24 hr capsule take 1 capsule by mouth at bedtime 30 capsule 5  . vitamin C (ASCORBIC ACID) 500 MG tablet Take 500 mg daily by mouth.     . vitamin E (VITAMIN E) 1000 UNIT capsule Take 1,000 Units daily by mouth.      No current facility-administered medications on file prior to visit.     Allergies  Allergen Reactions  . Sulfa Drugs Cross Reactors Shortness Of Breath and Palpitations    Family History  Problem Relation Age of Onset  . CAD Brother   . Diabetes Brother   . CAD Father   . Diabetes Father   . Diabetes Mother        father, sister, brothers  . Diabetes Sister   . Thyroid disease Sister     BP (!) 112/50 (BP Location: Left Arm, Patient Position: Sitting, Cuff Size: Normal)   Pulse 76   Wt 123 lb 6.4 oz (56 kg)   SpO2 96%   BMI 19.92 kg/m    Review of Systems Denies fever.     Objective:   Physical Exam VS: see vs page.   GEN: no distress.  NECK: multinodular goiter is palpated (R>L).        Assessment & Plan:  Hyperthyroidism: worse, prob due to med noncompliance.  Increase tapazole to 40-BID

## 2017-09-24 NOTE — Patient Instructions (Signed)
blood tests are requested for you today.  We'll let you know about the results.   Please come back for a follow-up appointment in 2 months.  

## 2017-09-25 MED ORDER — METHIMAZOLE 10 MG PO TABS: 40.0000 mg | ORAL_TABLET | Freq: Two times a day (BID) | ORAL | 5 refills | Status: DC

## 2017-09-29 ENCOUNTER — Encounter (HOSPITAL_COMMUNITY): Payer: Self-pay | Admitting: *Deleted

## 2017-09-29 ENCOUNTER — Inpatient Hospital Stay (HOSPITAL_COMMUNITY)
Admission: EM | Admit: 2017-09-29 | Discharge: 2017-10-04 | DRG: 643 | Disposition: A | Discharge status: Home Health | Payer: Medicare HMO | Attending: Internal Medicine | Admitting: Internal Medicine

## 2017-09-29 ENCOUNTER — Other Ambulatory Visit: Payer: Self-pay

## 2017-09-29 ENCOUNTER — Emergency Department (HOSPITAL_COMMUNITY): Payer: Medicare HMO

## 2017-09-29 ENCOUNTER — Ambulatory Visit: Payer: Self-pay

## 2017-09-29 DIAGNOSIS — Z90710 Acquired absence of both cervix and uterus: Secondary | ICD-10-CM | POA: Diagnosis not present

## 2017-09-29 DIAGNOSIS — Z882 Allergy status to sulfonamides status: Secondary | ICD-10-CM | POA: Diagnosis not present

## 2017-09-29 DIAGNOSIS — Z833 Family history of diabetes mellitus: Secondary | ICD-10-CM | POA: Diagnosis not present

## 2017-09-29 DIAGNOSIS — Z7982 Long term (current) use of aspirin: Secondary | ICD-10-CM | POA: Diagnosis not present

## 2017-09-29 DIAGNOSIS — E7800 Pure hypercholesterolemia, unspecified: Secondary | ICD-10-CM | POA: Diagnosis not present

## 2017-09-29 DIAGNOSIS — R778 Other specified abnormalities of plasma proteins: Secondary | ICD-10-CM | POA: Diagnosis present

## 2017-09-29 DIAGNOSIS — R4182 Altered mental status, unspecified: Secondary | ICD-10-CM | POA: Diagnosis not present

## 2017-09-29 DIAGNOSIS — R6510 Systemic inflammatory response syndrome (SIRS) of non-infectious origin without acute organ dysfunction: Secondary | ICD-10-CM | POA: Diagnosis not present

## 2017-09-29 DIAGNOSIS — I248 Other forms of acute ischemic heart disease: Secondary | ICD-10-CM | POA: Diagnosis not present

## 2017-09-29 DIAGNOSIS — I214 Non-ST elevation (NSTEMI) myocardial infarction: Secondary | ICD-10-CM | POA: Diagnosis present

## 2017-09-29 DIAGNOSIS — Z955 Presence of coronary angioplasty implant and graft: Secondary | ICD-10-CM | POA: Diagnosis not present

## 2017-09-29 DIAGNOSIS — Z8349 Family history of other endocrine, nutritional and metabolic diseases: Secondary | ICD-10-CM

## 2017-09-29 DIAGNOSIS — E0590 Thyrotoxicosis, unspecified without thyrotoxic crisis or storm: Principal | ICD-10-CM | POA: Diagnosis present

## 2017-09-29 DIAGNOSIS — E876 Hypokalemia: Secondary | ICD-10-CM | POA: Diagnosis not present

## 2017-09-29 DIAGNOSIS — R569 Unspecified convulsions: Secondary | ICD-10-CM | POA: Diagnosis not present

## 2017-09-29 DIAGNOSIS — M1990 Unspecified osteoarthritis, unspecified site: Secondary | ICD-10-CM | POA: Diagnosis not present

## 2017-09-29 DIAGNOSIS — G8929 Other chronic pain: Secondary | ICD-10-CM | POA: Diagnosis present

## 2017-09-29 DIAGNOSIS — I361 Nonrheumatic tricuspid (valve) insufficiency: Secondary | ICD-10-CM | POA: Diagnosis not present

## 2017-09-29 DIAGNOSIS — I2510 Atherosclerotic heart disease of native coronary artery without angina pectoris: Secondary | ICD-10-CM | POA: Diagnosis not present

## 2017-09-29 DIAGNOSIS — R748 Abnormal levels of other serum enzymes: Secondary | ICD-10-CM | POA: Diagnosis not present

## 2017-09-29 DIAGNOSIS — I10 Essential (primary) hypertension: Secondary | ICD-10-CM | POA: Diagnosis present

## 2017-09-29 DIAGNOSIS — K219 Gastro-esophageal reflux disease without esophagitis: Secondary | ICD-10-CM | POA: Diagnosis not present

## 2017-09-29 DIAGNOSIS — R55 Syncope and collapse: Secondary | ICD-10-CM | POA: Diagnosis present

## 2017-09-29 DIAGNOSIS — R079 Chest pain, unspecified: Secondary | ICD-10-CM | POA: Diagnosis not present

## 2017-09-29 DIAGNOSIS — R269 Unspecified abnormalities of gait and mobility: Secondary | ICD-10-CM | POA: Diagnosis not present

## 2017-09-29 DIAGNOSIS — E0581 Other thyrotoxicosis with thyrotoxic crisis or storm: Secondary | ICD-10-CM

## 2017-09-29 DIAGNOSIS — R7989 Other specified abnormal findings of blood chemistry: Secondary | ICD-10-CM | POA: Diagnosis not present

## 2017-09-29 DIAGNOSIS — D72819 Decreased white blood cell count, unspecified: Secondary | ICD-10-CM | POA: Diagnosis present

## 2017-09-29 DIAGNOSIS — Z96651 Presence of right artificial knee joint: Secondary | ICD-10-CM | POA: Diagnosis present

## 2017-09-29 DIAGNOSIS — I25119 Atherosclerotic heart disease of native coronary artery with unspecified angina pectoris: Secondary | ICD-10-CM | POA: Diagnosis present

## 2017-09-29 DIAGNOSIS — M545 Low back pain: Secondary | ICD-10-CM | POA: Diagnosis present

## 2017-09-29 DIAGNOSIS — Z8249 Family history of ischemic heart disease and other diseases of the circulatory system: Secondary | ICD-10-CM | POA: Diagnosis not present

## 2017-09-29 DIAGNOSIS — E0500 Thyrotoxicosis with diffuse goiter without thyrotoxic crisis or storm: Secondary | ICD-10-CM | POA: Diagnosis not present

## 2017-09-29 DIAGNOSIS — I252 Old myocardial infarction: Secondary | ICD-10-CM | POA: Diagnosis not present

## 2017-09-29 HISTORY — DX: Disorder of thyroid, unspecified: E07.9

## 2017-09-29 LAB — HEPATIC FUNCTION PANEL
ALBUMIN: 3.1 g/dL — AB (ref 3.5–5.0)
ALT: 55 U/L — ABNORMAL HIGH (ref 14–54)
AST: 39 U/L (ref 15–41)
Alkaline Phosphatase: 89 U/L (ref 38–126)
BILIRUBIN INDIRECT: 0.9 mg/dL (ref 0.3–0.9)
BILIRUBIN TOTAL: 1.2 mg/dL (ref 0.3–1.2)
Bilirubin, Direct: 0.3 mg/dL (ref 0.1–0.5)
TOTAL PROTEIN: 6.7 g/dL (ref 6.5–8.1)

## 2017-09-29 LAB — PROCALCITONIN

## 2017-09-29 LAB — PROTIME-INR
INR: 1.38
PROTHROMBIN TIME: 16.8 s — AB (ref 11.4–15.2)

## 2017-09-29 LAB — CBC
HEMATOCRIT: 34.3 % — AB (ref 36.0–46.0)
Hemoglobin: 11.6 g/dL — ABNORMAL LOW (ref 12.0–15.0)
MCH: 31.2 pg (ref 26.0–34.0)
MCHC: 33.8 g/dL (ref 30.0–36.0)
MCV: 92.2 fL (ref 78.0–100.0)
Platelets: 259 10*3/uL (ref 150–400)
RBC: 3.72 MIL/uL — ABNORMAL LOW (ref 3.87–5.11)
RDW: 15.4 % (ref 11.5–15.5)
WBC: 3.2 10*3/uL — ABNORMAL LOW (ref 4.0–10.5)

## 2017-09-29 LAB — RAPID URINE DRUG SCREEN, HOSP PERFORMED
AMPHETAMINES: NOT DETECTED
Barbiturates: NOT DETECTED
Benzodiazepines: NOT DETECTED
Cocaine: NOT DETECTED
OPIATES: NOT DETECTED
Tetrahydrocannabinol: NOT DETECTED

## 2017-09-29 LAB — BASIC METABOLIC PANEL
Anion gap: 11 (ref 5–15)
BUN: 14 mg/dL (ref 6–20)
CALCIUM: 10.2 mg/dL (ref 8.9–10.3)
CO2: 23 mmol/L (ref 22–32)
Chloride: 104 mmol/L (ref 101–111)
Creatinine, Ser: 0.75 mg/dL (ref 0.44–1.00)
GFR calc Af Amer: >60 mL/min (ref >60)
GLUCOSE: 175 mg/dL — AB (ref 65–99)
Potassium: 3.1 mmol/L — ABNORMAL LOW (ref 3.5–5.1)
Sodium: 138 mmol/L (ref 135–145)

## 2017-09-29 LAB — URINALYSIS, ROUTINE W REFLEX MICROSCOPIC
BILIRUBIN URINE: NEGATIVE
Glucose, UA: NEGATIVE mg/dL
HGB URINE DIPSTICK: NEGATIVE
KETONES UR: NEGATIVE mg/dL
Leukocytes, UA: NEGATIVE
NITRITE: NEGATIVE
PH: 5 (ref 5.0–8.0)
Protein, ur: NEGATIVE mg/dL
Specific Gravity, Urine: 1.024 (ref 1.005–1.030)

## 2017-09-29 LAB — CBG MONITORING, ED: Glucose-Capillary: 175 mg/dL — ABNORMAL HIGH (ref 65–99)

## 2017-09-29 LAB — I-STAT TROPONIN, ED: Troponin i, poc: 0.39 ng/mL (ref 0.00–0.08)

## 2017-09-29 LAB — TSH: TSH: <0.01 u[IU]/mL — ABNORMAL LOW (ref 0.350–4.500)

## 2017-09-29 LAB — APTT: aPTT: 25 seconds (ref 24–36)

## 2017-09-29 LAB — T4, FREE: Free T4: 4.04 ng/dL — ABNORMAL HIGH (ref 0.61–1.12)

## 2017-09-29 LAB — LACTIC ACID, PLASMA: LACTIC ACID, VENOUS: 1.5 mmol/L (ref 0.5–1.9)

## 2017-09-29 LAB — I-STAT CG4 LACTIC ACID, ED
LACTIC ACID, VENOUS: 2.63 mmol/L — AB (ref 0.5–1.9)
Lactic Acid, Venous: 4.19 mmol/L (ref 0.5–1.9)

## 2017-09-29 LAB — TROPONIN I: Troponin I: 0.47 ng/mL (ref <0.03)

## 2017-09-29 MED ORDER — VITAMIN D 1000 UNITS PO TABS
1000.0000 [IU] | ORAL_TABLET | Freq: Every day | ORAL | Status: DC
  Administered 2017-09-30 – 2017-10-04 (×5): 1000 [IU] via ORAL
  Filled 2017-09-29 (×5): qty 1

## 2017-09-29 MED ORDER — NITROGLYCERIN 0.4 MG SL SUBL
0.4000 mg | SUBLINGUAL_TABLET | SUBLINGUAL | Status: DC | PRN
  Administered 2017-09-30: 0.4 mg via SUBLINGUAL
  Filled 2017-09-29: qty 1

## 2017-09-29 MED ORDER — ALPRAZOLAM 0.25 MG PO TABS
0.2500 mg | ORAL_TABLET | Freq: Two times a day (BID) | ORAL | Status: DC | PRN
  Administered 2017-10-01 – 2017-10-02 (×3): 0.25 mg via ORAL
  Filled 2017-09-29 (×3): qty 1

## 2017-09-29 MED ORDER — TRAMADOL HCL 50 MG PO TABS
50.0000 mg | ORAL_TABLET | Freq: Four times a day (QID) | ORAL | Status: DC | PRN
  Administered 2017-09-30 (×2): 50 mg via ORAL
  Administered 2017-10-01: 100 mg via ORAL
  Filled 2017-09-29: qty 2
  Filled 2017-09-29 (×3): qty 1

## 2017-09-29 MED ORDER — ASPIRIN 81 MG PO CHEW
324.0000 mg | CHEWABLE_TABLET | Freq: Once | ORAL | Status: AC
  Administered 2017-09-29: 324 mg via ORAL
  Filled 2017-09-29: qty 4

## 2017-09-29 MED ORDER — VENLAFAXINE HCL ER 75 MG PO CP24
150.0000 mg | ORAL_CAPSULE | Freq: Every day | ORAL | Status: DC
  Administered 2017-09-30 – 2017-10-03 (×4): 150 mg via ORAL
  Filled 2017-09-29: qty 2
  Filled 2017-09-29 (×2): qty 1
  Filled 2017-09-29: qty 2
  Filled 2017-09-29: qty 1

## 2017-09-29 MED ORDER — ENSURE ENLIVE PO LIQD: 237.0000 mL | Freq: Two times a day (BID) | ORAL | Status: DC

## 2017-09-29 MED ORDER — EZETIMIBE 10 MG PO TABS
10.0000 mg | ORAL_TABLET | Freq: Every day | ORAL | Status: DC
  Administered 2017-09-30 – 2017-10-04 (×5): 10 mg via ORAL
  Filled 2017-09-29 (×5): qty 1

## 2017-09-29 MED ORDER — ATORVASTATIN CALCIUM 20 MG PO TABS
20.0000 mg | ORAL_TABLET | Freq: Every day | ORAL | Status: DC
  Administered 2017-09-30 – 2017-10-04 (×5): 20 mg via ORAL
  Filled 2017-09-29 (×5): qty 1

## 2017-09-29 MED ORDER — POTASSIUM CHLORIDE CRYS ER 20 MEQ PO TBCR
20.0000 meq | EXTENDED_RELEASE_TABLET | Freq: Three times a day (TID) | ORAL | Status: DC
  Administered 2017-09-30 – 2017-10-02 (×10): 20 meq via ORAL
  Filled 2017-09-29 (×10): qty 1

## 2017-09-29 MED ORDER — PROPRANOLOL HCL 60 MG PO TABS: 60.0000 mg | ORAL_TABLET | Freq: Four times a day (QID) | ORAL | Status: DC

## 2017-09-29 MED ORDER — ISOSORBIDE MONONITRATE ER 60 MG PO TB24
120.0000 mg | ORAL_TABLET | Freq: Every day | ORAL | Status: DC
  Administered 2017-09-30 – 2017-10-04 (×5): 120 mg via ORAL
  Filled 2017-09-29 (×5): qty 2

## 2017-09-29 MED ORDER — ASPIRIN EC 81 MG PO TBEC
81.0000 mg | DELAYED_RELEASE_TABLET | Freq: Every day | ORAL | Status: DC
  Administered 2017-09-30 – 2017-10-04 (×5): 81 mg via ORAL
  Filled 2017-09-29 (×5): qty 1

## 2017-09-29 MED ORDER — VANCOMYCIN HCL IN DEXTROSE 1-5 GM/200ML-% IV SOLN
1000.0000 mg | Freq: Once | INTRAVENOUS | Status: AC
  Administered 2017-09-29: 1000 mg via INTRAVENOUS
  Filled 2017-09-29: qty 200

## 2017-09-29 MED ORDER — SODIUM CHLORIDE 0.9 % IV BOLUS (SEPSIS)
750.0000 mL | Freq: Once | INTRAVENOUS | Status: AC
  Administered 2017-09-29: 750 mL via INTRAVENOUS

## 2017-09-29 MED ORDER — METOPROLOL TARTRATE 5 MG/5ML IV SOLN
2.5000 mg | Freq: Once | INTRAVENOUS | Status: AC
  Administered 2017-09-29: 2.5 mg via INTRAVENOUS
  Filled 2017-09-29: qty 5

## 2017-09-29 MED ORDER — DEXTROSE 5 % IV SOLN
1.0000 g | Freq: Once | INTRAVENOUS | Status: AC
  Administered 2017-09-29: 1 g via INTRAVENOUS
  Filled 2017-09-29: qty 10

## 2017-09-29 MED ORDER — VANCOMYCIN HCL IN DEXTROSE 750-5 MG/150ML-% IV SOLN
750.0000 mg | Freq: Two times a day (BID) | INTRAVENOUS | Status: DC
  Administered 2017-09-30 (×2): 750 mg via INTRAVENOUS
  Filled 2017-09-29 (×3): qty 150

## 2017-09-29 MED ORDER — POTASSIUM CHLORIDE IN NACL 20-0.9 MEQ/L-% IV SOLN
INTRAVENOUS | Status: AC
  Administered 2017-09-29: 23:00:00 via INTRAVENOUS
  Filled 2017-09-29 (×2): qty 1000

## 2017-09-29 MED ORDER — ENOXAPARIN SODIUM 40 MG/0.4ML ~~LOC~~ SOLN: 40.0000 mg | SUBCUTANEOUS | Status: DC

## 2017-09-29 MED ORDER — METHIMAZOLE 10 MG PO TABS
20.0000 mg | ORAL_TABLET | Freq: Four times a day (QID) | ORAL | Status: DC
  Administered 2017-09-30 – 2017-10-04 (×20): 20 mg via ORAL
  Filled 2017-09-29 (×23): qty 2

## 2017-09-29 MED ORDER — OXYBUTYNIN CHLORIDE ER 10 MG PO TB24
10.0000 mg | ORAL_TABLET | Freq: Every day | ORAL | Status: DC
  Administered 2017-09-30 – 2017-10-04 (×5): 10 mg via ORAL
  Filled 2017-09-29 (×5): qty 1

## 2017-09-29 MED ORDER — PROPRANOLOL HCL 40 MG PO TABS
60.0000 mg | ORAL_TABLET | Freq: Four times a day (QID) | ORAL | Status: DC
  Administered 2017-09-29 – 2017-10-04 (×20): 60 mg via ORAL
  Filled 2017-09-29: qty 1
  Filled 2017-09-29 (×2): qty 2
  Filled 2017-09-29: qty 1
  Filled 2017-09-29: qty 2
  Filled 2017-09-29 (×2): qty 1
  Filled 2017-09-29 (×3): qty 2
  Filled 2017-09-29: qty 1
  Filled 2017-09-29 (×2): qty 2
  Filled 2017-09-29: qty 1
  Filled 2017-09-29: qty 2
  Filled 2017-09-29 (×3): qty 1
  Filled 2017-09-29: qty 2
  Filled 2017-09-29: qty 1
  Filled 2017-09-29: qty 2
  Filled 2017-09-29: qty 1

## 2017-09-29 MED ORDER — ACETAMINOPHEN 325 MG PO TABS
650.0000 mg | ORAL_TABLET | ORAL | Status: DC | PRN
  Administered 2017-09-30 – 2017-10-03 (×3): 650 mg via ORAL
  Filled 2017-09-29 (×3): qty 2

## 2017-09-29 MED ORDER — PIPERACILLIN-TAZOBACTAM 3.375 G IVPB
3.3750 g | Freq: Three times a day (TID) | INTRAVENOUS | Status: DC
  Administered 2017-09-30 – 2017-10-01 (×4): 3.375 g via INTRAVENOUS
  Filled 2017-09-29 (×5): qty 50

## 2017-09-29 MED ORDER — LISINOPRIL 10 MG PO TABS
20.0000 mg | ORAL_TABLET | Freq: Every day | ORAL | Status: DC
  Administered 2017-09-30 – 2017-10-04 (×5): 20 mg via ORAL
  Filled 2017-09-29 (×2): qty 2
  Filled 2017-09-29: qty 1
  Filled 2017-09-29: qty 2
  Filled 2017-09-29: qty 1

## 2017-09-29 MED ORDER — SODIUM CHLORIDE 0.9 % IV BOLUS (SEPSIS)
1000.0000 mL | Freq: Once | INTRAVENOUS | Status: AC
  Administered 2017-09-29: 1000 mL via INTRAVENOUS

## 2017-09-29 MED ORDER — METHIMAZOLE 10 MG PO TABS
20.0000 mg | ORAL_TABLET | Freq: Once | ORAL | Status: AC
  Administered 2017-09-29: 20 mg via ORAL
  Filled 2017-09-29: qty 2

## 2017-09-29 MED ORDER — ONDANSETRON HCL 4 MG/2ML IJ SOLN: 4.0000 mg | Freq: Four times a day (QID) | INTRAMUSCULAR | Status: DC | PRN

## 2017-09-29 MED ORDER — PANTOPRAZOLE SODIUM 40 MG PO TBEC
40.0000 mg | DELAYED_RELEASE_TABLET | Freq: Every day | ORAL | Status: DC
  Administered 2017-09-30 – 2017-10-04 (×5): 40 mg via ORAL
  Filled 2017-09-29 (×5): qty 1

## 2017-09-29 MED ORDER — HEPARIN (PORCINE) IN NACL 100-0.45 UNIT/ML-% IJ SOLN
1150.0000 [IU]/h | INTRAMUSCULAR | Status: DC
  Administered 2017-09-29: 650 [IU]/h via INTRAVENOUS
  Filled 2017-09-29 (×2): qty 250

## 2017-09-29 MED ORDER — PIPERACILLIN-TAZOBACTAM 3.375 G IVPB 30 MIN
3.3750 g | Freq: Once | INTRAVENOUS | Status: AC
  Administered 2017-09-29: 3.375 g via INTRAVENOUS
  Filled 2017-09-29: qty 50

## 2017-09-29 MED ORDER — HEPARIN BOLUS VIA INFUSION
3000.0000 [IU] | Freq: Once | INTRAVENOUS | Status: AC
  Administered 2017-09-29: 3000 [IU] via INTRAVENOUS
  Filled 2017-09-29: qty 3000

## 2017-09-29 NOTE — ED Provider Notes (Signed)
[facility] EMERGENCY [facility] Provider Note   CSN: [id number] Arrival date & time: 09/29/17  1538     History   Chief Complaint No chief complaint on file.   HPI [name] is a 71 y.o. female.  The history is provided by the patient. No language interpreter was used.    [name] is a 71 y.o. female who presents to the Emergency Department complaining of syncope.  She reports passing out twice today.  She describes herself as normally a very light sleeper but has large periods of time yesterday that she cannot account for.  She also reports feeling very shaky with urinary frequency.  She has associated headache with intermittent chest pressure as well as lower abdominal discomfort.  She does have shortness of breath but no cough.  She had fever 2 weeks ago but none currently.  She is currently being treated for hyperthyroidism.  She denies any tobacco, alcohol, drug use.  Past Medical History:  Diagnosis Date  . Arthritis   . Chronic lower back pain   . Cluster headache   . Coronary artery disease    a. 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 non obs dz LHC 2007. c. [facility] d. cath 05/26/17 mild non obstructive CAD  . Diverticulitis    a. Hx microperf 2012 - hospitalizated.  Marland Kitchen. GERD (gastroesophageal reflux disease)   . Hypercholesteremia   . Hypertension   . PVC's (premature ventricular contractions)    a. Hx of trigeminy/bigeminy.  . Seizures (HCC)   . Thyroid disease    hyperthyroid  . UTI (lower urinary tract infection)     Patient Active Problem List   Diagnosis Date Noted  . SIRS (systemic inflammatory response syndrome) (HCC) 09/29/2017  . Leukopenia 09/29/2017  . Thyrotoxicosis 09/29/2017  . Demand ischemia (HCC)   . Hyperthyroidism 05/22/2017  . Elevated lactic acid level 05/22/2017  . Dark stools 05/22/2017  . Elevated troponin 05/22/2017  . Hypomagnesemia 05/22/2017  . Hypercalcemia 05/22/2017  . Subacute  thyroiditis 05/22/2017  . Protein-calorie malnutrition, moderate (HCC) 05/22/2017  . Overactive bladder 09/19/2014  . Migraine 09/19/2014  . Hyperglycemia 09/19/2014  . Hypokalemia 06/01/2014  . CAD (coronary artery disease)   . Seizures (HCC)   . Arthritis   . Hypercholesteremia   . GERD (gastroesophageal reflux disease) 10/04/2011  . HTN (hypertension) 10/01/2011    Past Surgical History:  Procedure Laterality Date  . ABDOMINAL HYSTERECTOMY  1987   "partial"-still has ovaries  . CARDIAC CATHETERIZATION  05/17/2014  . CORONARY ANGIOPLASTY WITH STENT PLACEMENT  1995   "1"  . JOINT REPLACEMENT     right knee  . LEFT HEART CATH AND CORONARY ANGIOGRAPHY N/A 05/26/2017   Procedure: Left Heart Cath and Coronary Angiography;  Surgeon: Corky CraftsVaranasi, Jayadeep S, MD;  [facility];  Service: Cardiovascular;  Laterality: N/A;  . LEFT HEART CATHETERIZATION WITH CORONARY ANGIOGRAM N/A 05/17/2014   Procedure: LEFT HEART CATHETERIZATION WITH CORONARY ANGIOGRAM;  Surgeon: Marykay Lexavid W Harding, MD;  [facility];  Service: Cardiovascular;  Laterality: N/A;  . TOTAL KNEE ARTHROPLASTY Right 2005  . TUBAL LIGATION  1970  . VASCULAR SURGERY      OB History    No data available       Home Medications    Prior to Admission medications   Medication Sig Start Date End Date Taking? Authorizing Provider  acetaminophen (TYLENOL) 325 MG tablet Take 650 mg by mouth every 6 (  six) hours as needed for moderate pain.    Yes [provider]  Aloe Vera 25 MG CAPS Take 25 mg by mouth daily.   Yes [provider]  amLODipine (NORVASC) 10 MG tablet take 1 tablet by mouth once daily Patient taking differently: take 1 tablet(10mg ) by mouth once daily 06/15/17  Yes Shelva Majestic, MD  aspirin EC 81 MG tablet Take 81 mg daily by mouth.   Yes [provider]  atenolol (TENORMIN) 50 MG tablet Take 1 tablet (50 mg total) by mouth daily. 05/27/17  Yes Richarda Overlie, MD    atorvastatin (LIPITOR) 20 MG tablet take 1 tablet by mouth once daily Patient taking differently: take 1 tablet (20mg ) by mouth once daily 05/06/17  Yes Shelva Majestic, MD  cholecalciferol (VITAMIN D) 400 UNITS TABS tablet Take 400 Units daily by mouth.    Yes [provider]  Cyanocobalamin (VITAMIN B 12 PO) Take 1 capsule daily by mouth.    Yes [provider]  esomeprazole (NEXIUM) 40 MG capsule Take 1 capsule (40 mg total) by mouth daily. 11/19/16  Yes Shelva Majestic, MD  ezetimibe (ZETIA) 10 MG tablet take 1 tablet by mouth once daily Patient taking differently: take 1 tablet(10mg ) by mouth once daily 07/21/17  Yes Shelva Majestic, MD  feeding supplement, ENSURE ENLIVE, (ENSURE ENLIVE) LIQD Take 237 mLs by mouth 2 (two) times daily between meals. 05/25/17  Yes Richarda Overlie, MD  ferrous sulfate 325 (65 FE) MG tablet Take 1 tablet (325 mg total) by mouth daily. 05/21/17  Yes Jacalyn Lefevre, MD  isosorbide mononitrate (IMDUR) 60 MG 24 hr tablet take 2 tablets by mouth once daily Patient taking differently: take 2 tablets(120mg ) by mouth once daily 07/21/17  Yes Shelva Majestic, MD  lisinopril (PRINIVIL,ZESTRIL) 20 MG tablet take 1 tablet by mouth once daily Patient taking differently: take 1 tablet(20mg ) by mouth once daily 04/05/17  Yes Shelva Majestic, MD  methimazole (TAPAZOLE) 10 MG tablet Take 4 tablets (40 mg total) by mouth 2 (two) times daily. 09/25/17  Yes Romero Belling, MD  nitroGLYCERIN (NITROSTAT) 0.4 MG SL tablet PLACE 1 TABLET UNDER THE TONGUE EVERY 5 MINUTES FOR CHEST PAIN FOR 3 DOSES 07/12/17  Yes Shelva Majestic, MD  oxybutynin (DITROPAN-XL) 10 MG 24 hr tablet Take 1 tablet (10 mg total) by mouth daily. 11/19/16  Yes Shelva Majestic, MD  potassium chloride (KLOR-CON 10) 10 MEQ tablet Take 1 tablet (10 mEq total) by mouth 2 (two) times daily. 06/04/17  Yes Shelva Majestic, MD  traMADol (ULTRAM) 50 MG tablet Take 1-2 tablets (50-100 mg total) by mouth  every 6 (six) hours as needed for moderate pain. 05/27/17  Yes Richarda Overlie, MD  venlafaxine XR (EFFEXOR-XR) 150 MG 24 hr capsule take 1 capsule by mouth at bedtime 06/15/17  Yes Shelva Majestic, MD  vitamin C (ASCORBIC ACID) 500 MG tablet Take 500 mg daily by mouth.    Yes [provider]  vitamin E (VITAMIN E) 1000 UNIT capsule Take 1,000 Units daily by mouth.    Yes [provider]    Family History Family History  Problem Relation Age of Onset  . CAD Brother   . Diabetes Brother   . CAD Father   . Diabetes Father   . Diabetes Mother        father, sister, brothers  . Diabetes Sister   . Thyroid disease Sister     Social History Social  History   Tobacco Use  . Smoking status: Never Smoker  . Smokeless tobacco: Never Used  Substance Use Topics  . Alcohol use: No  . Drug use: No     Allergies   Sulfa drugs cross reactors   Review of Systems Review of Systems  All other systems reviewed and are negative.    Physical Exam Updated Vital Signs BP (!) 165/92   Pulse 87   Temp 98.7 F (37.1 C) (Oral)   Resp 20   Ht 5\' 6"  (1.676 m)   Wt 55.8 kg (123 lb)   SpO2 100%   BMI 19.85 kg/m   Physical Exam  Constitutional: She is oriented to person, place, and time. She appears well-developed and well-nourished.  HENT:  Head: Normocephalic and atraumatic.  Cardiovascular: Normal rate and regular rhythm.  No murmur heard. Pulmonary/Chest: Effort normal and breath sounds normal. No respiratory distress.  Abdominal: Soft. There is no tenderness. There is no rebound and no guarding.  Musculoskeletal: She exhibits no edema or tenderness.  Neurological: She is alert and oriented to person, place, and time.  Mildly tremulous in all 4 extremities.  5 out of 5 strength in all 4 extremities.  Skin: Skin is warm and dry.  Psychiatric: Her behavior is normal.  Anxious appearing with rapid speech  Nursing note and vitals reviewed.    ED Treatments /  Results  Labs (all labs ordered are listed, but only abnormal results are displayed) Labs Reviewed  BASIC METABOLIC PANEL - Abnormal; Notable for the following components:      Result Value   Potassium 3.1 (*)    Glucose, Bld 175 (*)    All other components within normal limits  CBC - Abnormal; Notable for the following components:   WBC 3.2 (*)    RBC 3.72 (*)    Hemoglobin 11.6 (*)    HCT 34.3 (*)    All other components within normal limits  URINALYSIS, ROUTINE W REFLEX MICROSCOPIC - Abnormal; Notable for the following components:   Color, Urine AMBER (*)    All other components within normal limits  HEPATIC FUNCTION PANEL - Abnormal; Notable for the following components:   Albumin 3.1 (*)    ALT 55 (*)    All other components within normal limits  PROTIME-INR - Abnormal; Notable for the following components:   Prothrombin Time 16.8 (*)    All other components within normal limits  TROPONIN I - Abnormal; Notable for the following components:   Troponin I 0.47 (*)    All other components within normal limits  TSH - Abnormal; Notable for the following components:   TSH <0.010 (*)    All other components within normal limits  T4, FREE - Abnormal; Notable for the following components:   Free T4 4.04 (*)    All other components within normal limits  CBG MONITORING, ED - Abnormal; Notable for the following components:   Glucose-Capillary 175 (*)    All other components within normal limits  I-STAT CG4 LACTIC ACID, ED - Abnormal; Notable for the following components:   Lactic Acid, Venous 4.19 (*)    All other components within normal limits  I-STAT CG4 LACTIC ACID, ED - Abnormal; Notable for the following components:   Lactic Acid, Venous 2.63 (*)    All other components within normal limits  I-STAT TROPONIN, ED - Abnormal; Notable for the following components:   Troponin i, poc 0.39 (*)    All other components within normal limits  CULTURE, BLOOD (ROUTINE X 2)  CULTURE,  BLOOD (ROUTINE X 2)  URINE CULTURE  RAPID URINE DRUG SCREEN, HOSP PERFORMED  LACTIC ACID, PLASMA  PROCALCITONIN  APTT  TROPONIN I  TROPONIN I  T3, FREE  BASIC METABOLIC PANEL  CBC  HEPARIN LEVEL (UNFRACTIONATED)    EKG  EKG Interpretation  Date/Time:  Wednesday September 29 2017 19:15:34 EST Ventricular Rate:  98 PR Interval:  112 QRS Duration: 85 QT Interval:  295 QTC Calculation: 377 R Axis:   65 Text Interpretation:  Sinus tachycardia Ventricular premature complex Probable left atrial enlargement Repol abnrm suggests ischemia, diffuse leads Confirmed by Tilden Fossa [phone]) on 09/29/2017 7:50:10 PM       Radiology Dg Chest 2 View  Result Date: 09/29/2017 CLINICAL DATA:  Syncope and chest pain EXAM: CHEST  2 VIEW COMPARISON:  Chest radiograph May 22, 2017 and chest CT May 22, 2017 FINDINGS: There is no edema or consolidation. Heart is borderline enlarged with pulmonary vascularity within normal limits. No adenopathy. There is aortic atherosclerosis. No evident bone lesions. IMPRESSION: Borderline cardiac enlargement. No edema or consolidation. There is aortic atherosclerosis. Aortic Atherosclerosis (ICD10-I70.0). Electronically Signed   By: Bretta Bang III M.D.   On: 09/29/2017 18:28   Ct Head Wo Contrast  Result Date: 09/29/2017 CLINICAL DATA:  Altered mental status with intermittent confusion. History of seizures EXAM: CT HEAD WITHOUT CONTRAST TECHNIQUE: Contiguous axial images were obtained from the base of the skull through the vertex without intravenous contrast. COMPARISON:  May 21, 2017 an September 10, 2011 FINDINGS: Brain: The ventricles are normal in size and configuration. There is no evident intracranial mass, hemorrhage, extra-axial fluid collection, or midline shift. There is slight small vessel disease in the anterior centra semiovale bilaterally. Elsewhere gray-white compartments appear normal. There is no evident acute infarct. Vascular: There is no  hyperdense vessel. There is mild calcification in each carotid siphon. 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 No other bone lesions evident. Sinuses/Orbits: There is mild mucosal thickening in several ethmoid air cells. Other visualized paranasal sinuses are clear. Orbits appear symmetric bilaterally. Other: Mastoid air cells are clear. IMPRESSION: Slight periventricular small vessel disease. Gray-white compartments otherwise appear normal. No mass or hemorrhage. Stable posterior left frontal osteoma. Bony calvarium otherwise unremarkable and stable. Areas of arterial vascular calcification noted. Mild mucosal thickening noted in several ethmoid air cells. Electronically Signed   By: Bretta Bang III M.D.   On: 09/29/2017 18:52    Procedures Procedures (including critical care time) CRITICAL CARE Performed by: Tilden Fossa   Total critical care time: 35 minutes  Critical care time was exclusive of separately billable procedures and treating other patients.  Critical care was necessary to treat or prevent imminent or life-threatening deterioration.  Critical care was time spent personally by me on the following activities: development of treatment plan with patient and/or surrogate as well as nursing, discussions with consultants, evaluation of patient's response to treatment, examination of patient, obtaining history from patient or surrogate, ordering and performing treatments and interventions, ordering and review of laboratory studies, ordering and review of radiographic studies, pulse oximetry and re-evaluation of patient's condition.  Medications Ordered in ED Medications  aspirin EC tablet 81 mg (not administered)  atorvastatin (LIPITOR) tablet 20 mg (not administered)  cholecalciferol (VITAMIN D) tablet 1,000 Units (not administered)  pantoprazole (PROTONIX) EC tablet 40 mg (not administered)  ezetimibe (ZETIA) tablet 10 mg (not administered)  feeding  supplement (ENSURE ENLIVE) (ENSURE ENLIVE) liquid 237  mL (not administered)  isosorbide mononitrate (IMDUR) 24 hr tablet 120 mg (not administered)  lisinopril (PRINIVIL,ZESTRIL) tablet 20 mg (not administered)  oxybutynin (DITROPAN-XL) 24 hr tablet 10 mg (not administered)  potassium chloride SA (K-DUR,KLOR-CON) CR tablet 20 mEq (not administered)  traMADol (ULTRAM) tablet 50-100 mg (not administered)  venlafaxine XR (EFFEXOR-XR) 24 hr capsule 150 mg (not administered)  nitroGLYCERIN (NITROSTAT) SL tablet 0.4 mg (not administered)  acetaminophen (TYLENOL) tablet 650 mg (not administered)  ondansetron (ZOFRAN) injection 4 mg (not administered)  0.9 % NaCl with KCl 20 mEq/ L  infusion ( Intravenous New Bag/Given 09/29/17 2257)  ALPRAZolam (XANAX) tablet 0.25 mg (not administered)  methimazole (TAPAZOLE) tablet 20 mg (not administered)  piperacillin-tazobactam (ZOSYN) IVPB 3.375 g (not administered)  vancomycin (VANCOCIN) IVPB 750 mg/150 ml premix (not administered)  propranolol (INDERAL) tablet 60 mg (60 mg Oral Given 09/29/17 2300)  heparin ADULT infusion 100 units/mL (25000 units/264mL sodium chloride 0.45%) (650 Units/hr Intravenous New Bag/Given 09/29/17 2258)  sodium chloride 0.9 % bolus 1,000 mL (0 mLs Intravenous Stopped 09/29/17 1852)  cefTRIAXone (ROCEPHIN) 1 g in dextrose 5 % 50 mL IVPB (0 g Intravenous Stopped 09/29/17 1936)  aspirin chewable tablet 324 mg (324 mg Oral Given 09/29/17 1943)  metoprolol tartrate (LOPRESSOR) injection 2.5 mg (2.5 mg Intravenous Given 09/29/17 1944)  methimazole (TAPAZOLE) tablet 20 mg (20 mg Oral Given 09/29/17 2019)  sodium chloride 0.9 % bolus 750 mL (0 mLs Intravenous Stopped 09/29/17 2219)  piperacillin-tazobactam (ZOSYN) IVPB 3.375 g (0 g Intravenous Stopped 09/29/17 2151)  vancomycin (VANCOCIN) IVPB 1000 mg/200 mL premix (0 mg Intravenous Stopped 09/29/17 2151)  heparin bolus via infusion 3,000 Units (3,000 Units Intravenous Bolus from Bag 09/29/17 2258)      Initial Impression / Assessment and Plan / ED Course  I have reviewed the triage vital signs and the nursing notes.  Pertinent labs & imaging results that were available during my care of the patient were reviewed by me and considered in my medical decision making (see chart for details).     Patient with history of coronary artery disease and Graves' disease here with multiple complaints but predominantly weakness, urinary frequency and syncope.  Initial lactic acid was elevated given patient's urinary symptoms there is initial concern for possible UTI and she was started on IV fluids and IV antibiotics.  Elevated lactate is thought to be due to dehydration and not secondary to sepsis.  When the urine became available it is not consistent with UTI and she has no concerning features for pneumonia.  Troponin is elevated, concern for demand ischemia secondary to hyperthyroidism.  She was given metoprolol, methimazole.  Plan to admit for further workup and treatment.  Final Clinical Impressions(s) / ED Diagnoses   Final diagnoses:  None    ED Discharge Orders    None       Tilden Fossa, MD 09/30/17 [phone]

## 2017-09-29 NOTE — Telephone Encounter (Signed)
When I began by asking pt name and DOB pt gave wrong first name and correct last name and then said 1847 instead of 1947. Pt stated that her tyroid was acting up and she has a headache, "swimmy headed" and fell yesterday. Stated she has been dizzy x 3 days. Pt states she feel disoriented and last night left the doors open to her house. Pt asked if another doctor could fill her thyroid med. Informed pt that she would have to call Dr Everardo AllEllison. During call pt stated, "I'm going to go to the ER." Pt states her son will drive her. Disconnected with pt before could answer nursing assessment questions. Forwarding this to her PCP.

## 2017-09-29 NOTE — ED Notes (Signed)
MD Criss AlvineGoldston and Nurse Beryle QuantKim F. notified of elevated I-stat lactic acid result.

## 2017-09-29 NOTE — ED Triage Notes (Signed)
PT here with family stating that she has "passed out" several times this last week, she has been forgetful (can't remember date or what happened yesterday).  Pt states, "I'm so thirsty and I've been peeing out more than I've been drinking".  Pt states she is being tx for hyperthyroid and the MD has been adjusting her meds.  Unable to state year, date or age, but able to speak in clear sentences.

## 2017-09-29 NOTE — ED Notes (Signed)
Patient transported to X-ray 

## 2017-09-29 NOTE — Progress Notes (Signed)
ANTICOAGULATION CONSULT NOTE - Initial Consult  Pharmacy Consult for Heparin Indication: chest pain/ACS  Allergies  Allergen Reactions  . Sulfa Drugs Cross Reactors Shortness Of Breath and Palpitations    Patient Measurements: Height: 5\' 6"  (167.6 cm) Weight: 123 lb (55.8 kg) IBW/kg (Calculated) : 59.3  Vital Signs: Temp: 98.7 F (37.1 C) (12/05 1547) Temp Source: Oral (12/05 1547) BP: 137/61 (12/05 2200) Pulse Rate: 92 (12/05 2200)  Labs: Recent Labs    09/29/17 1552 09/29/17 2014  HGB 11.6*  --   HCT 34.3*  --   PLT 259  --   APTT  --  25  LABPROT  --  16.8*  INR  --  1.38  CREATININE 0.75  --   TROPONINI  --  0.47*    Estimated Creatinine Clearance: 57.6 mL/min (by C-G formula based on SCr of 0.75 mg/dL).   Medical History: Past Medical History:  Diagnosis Date  . Arthritis   . Chronic lower back pain   . Cluster headache   . Coronary artery disease    a. 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 non obs dz LHC 2007. c. [facility] d. cath 05/26/17 mild non obstructive CAD  . Diverticulitis    a. Hx microperf 2012 - hospitalizated.  Marland Kitchen. GERD (gastroesophageal reflux disease)   . Hypercholesteremia   . Hypertension   . PVC's (premature ventricular contractions)    a. Hx of trigeminy/bigeminy.  . Seizures (HCC)   . Thyroid disease    hyperthyroid  . UTI (lower urinary tract infection)     Medications:   (Not in a hospital admission)  Assessment: [age] YOF here with sepsis not to start IV heparin for ACS. H/H slightly low, Plt wnl. Pt is not on any anticoagulation at home.   Goal of Therapy:  Heparin level 0.3-0.7 units/ml Monitor platelets by anticoagulation protocol: Yes   Plan:  -Heparin 3000 units bolus, then heparin infusion at 650 units/hr -F/u 8 hr HL -Monitor daily HL, CBC and s/s of bleeding  Vinnie LevelBenjamin Apolonio Cutting, PharmD., BCPS Clinical Pharmacist Pager [phone]

## 2017-09-29 NOTE — ED Notes (Signed)
Date and time results received: 09/29/17 10:13 PM  (use smartphrase ".now" to insert current time)  Test: Troponin Critical Value: 0.47  Name of Provider Notified: Dr. Antionette Charpyd via text page and primary RN Maggie  Orders Received? Or Actions Taken?:awaiting response

## 2017-09-29 NOTE — H&P (Signed)
History and Physical    [name] ZOX:[id number] DOB: 07/31/1946 DOA: 09/29/2017  PCP: [name], Stephen O, MD   Patient coming from: Home  Chief Complaint: Lightheaded, confused, palpitations, tremors   HPI: [name] is a 71 y.o. female with medical history significant for coronary artery disease, GERD, hypertension, and hyperthyroidism followed by endocrinology, now presenting to the emergency department for evaluation of confusion and lightheadedness for the past several days.  Patient had been seen by her endocrinologist 1 week ago with worsening hyperthyroidism and had her Tapazole increased.  Due to some confusion, patient actually stopped her Tapazole at that time and has not taken any for the past week.  Over this interval, she has become increasingly confused and "dizzy," described as a lightheadedness. She also reports palpitations, but denies chest pain. Denies dyspnea.   ED Course: Upon arrival to the ED, patient is found to be afebrile, saturating well on room air, slightly tachypneic and hypertensive, and with vitals otherwise stable.  EKG features a sinus rhythm with PVC and diffuse repolarization abnormality.  Chest x-ray is notable for borderline cardiomegaly, but no edema or consolidation.  Head CT reveals slight periventricular small vessel disease, but no acute findings.  Chemistry panel is notable for potassium 3.1 and CBC features a leukopenia with WBC 3200.  Lactic acid is elevated at 4.19 and troponin is elevated to 0.39.  Urinalysis is unremarkable.  Blood and urine cultures were collected in the ED, 324 mg of aspirin was given, 1 L of normal saline was given, and the patient was also treated with Tapazole, Lopressor, and Rocephin.  Lactic acid improved to 2.63 with the IV fluids.  Patient will be admitted to the stepdown unit for ongoing evaluation and management of thyrotoxicosis with confusion and possible NSTEMI, likely secondary to thyrotoxicosis.  Review of Systems:    All other systems reviewed and apart from HPI, are negative.  Past Medical History:  Diagnosis Date  . Arthritis   . Chronic lower back pain   . Cluster headache   . Coronary artery disease    a. 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 non obs dz LHC 2007. c. [facility] d. cath 05/26/17 mild non obstructive CAD  . Diverticulitis    a. Hx microperf 2012 - hospitalizated.  Marland Kitchen. GERD (gastroesophageal reflux disease)   . Hypercholesteremia   . Hypertension   . PVC's (premature ventricular contractions)    a. Hx of trigeminy/bigeminy.  . Seizures (HCC)   . Thyroid disease    hyperthyroid  . UTI (lower urinary tract infection)     Past Surgical History:  Procedure Laterality Date  . ABDOMINAL HYSTERECTOMY  1987   "partial"-still has ovaries  . CARDIAC CATHETERIZATION  05/17/2014  . CORONARY ANGIOPLASTY WITH STENT PLACEMENT  1995   "1"  . JOINT REPLACEMENT     right knee  . LEFT HEART CATH AND CORONARY ANGIOGRAPHY N/A 05/26/2017   Procedure: Left Heart Cath and Coronary Angiography;  Surgeon: Corky CraftsVaranasi, Jayadeep S, MD;  [facility];  Service: Cardiovascular;  Laterality: N/A;  . LEFT HEART CATHETERIZATION WITH CORONARY ANGIOGRAM N/A 05/17/2014   Procedure: LEFT HEART CATHETERIZATION WITH CORONARY ANGIOGRAM;  Surgeon: Marykay Lexavid W Harding, MD;  [facility];  Service: Cardiovascular;  Laterality: N/A;  . TOTAL KNEE ARTHROPLASTY Right 2005  . TUBAL LIGATION  1970  . VASCULAR SURGERY       reports that  has never smoked. she has never used smokeless  tobacco. She reports that she does not drink alcohol or use drugs.  Allergies  Allergen Reactions  . Sulfa Drugs Cross Reactors Shortness Of Breath and Palpitations    Family History  Problem Relation Age of Onset  . CAD Brother   . Diabetes Brother   . CAD Father   . Diabetes Father   . Diabetes Mother        father, sister, brothers  . Diabetes Sister   . Thyroid disease Sister      Prior to  Admission medications   Medication Sig Start Date End Date Taking? Authorizing Provider  acetaminophen (TYLENOL) 325 MG tablet Take 650 mg by mouth every 6 (six) hours as needed for moderate pain.     [provider]  Aloe Vera 25 MG CAPS Take 25 mg by mouth daily.     [provider]  amLODipine (NORVASC) 10 MG tablet take 1 tablet by mouth once daily 06/15/17   [name], Stephen O, MD  aspirin EC 81 MG tablet Take 81 mg daily by mouth.    [provider]  atenolol (TENORMIN) 50 MG tablet Take 1 tablet (50 mg total) by mouth daily. 05/27/17   Richarda OverlieAbrol, Nayana, MD  atorvastatin (LIPITOR) 20 MG tablet take 1 tablet by mouth once daily 05/06/17   [name], Stephen O, MD  cholecalciferol (VITAMIN D) 400 UNITS TABS tablet Take 400 Units daily by mouth.     [provider]  Cyanocobalamin (VITAMIN B 12 PO) Take 1 capsule daily by mouth.     [provider]  esomeprazole (NEXIUM) 40 MG capsule Take 1 capsule (40 mg total) by mouth daily. 11/19/16   [name], Stephen O, MD  ezetimibe (ZETIA) 10 MG tablet take 1 tablet by mouth once daily 07/21/17   [name], Stephen O, MD  feeding supplement, ENSURE ENLIVE, (ENSURE ENLIVE) LIQD Take 237 mLs by mouth 2 (two) times daily between meals. 05/25/17   Richarda OverlieAbrol, Nayana, MD  ferrous sulfate 325 (65 FE) MG tablet Take 1 tablet (325 mg total) by mouth daily. 05/21/17   Jacalyn LefevreHaviland, Julie, MD  isosorbide mononitrate (IMDUR) 60 MG 24 hr tablet take 2 tablets by mouth once daily 07/21/17   [name], Stephen O, MD  lisinopril (PRINIVIL,ZESTRIL) 20 MG tablet take 1 tablet by mouth once daily 04/05/17   [name], Stephen O, MD  methimazole (TAPAZOLE) 10 MG tablet Take 4 tablets (40 mg total) by mouth 2 (two) times daily. 09/25/17   Romero BellingEllison, Sean, MD  nitroGLYCERIN (NITROSTAT) 0.4 MG SL tablet PLACE 1 TABLET UNDER THE TONGUE EVERY 5 MINUTES FOR CHEST PAIN FOR 3 DOSES 07/12/17   [name], Stephen O, MD  oxybutynin (DITROPAN-XL) 10 MG 24 hr tablet Take 1 tablet (10  mg total) by mouth daily. 11/19/16   [name], Stephen O, MD  potassium chloride (KLOR-CON 10) 10 MEQ tablet Take 1 tablet (10 mEq total) by mouth 2 (two) times daily. 06/04/17   [name], Stephen O, MD  traMADol (ULTRAM) 50 MG tablet Take 1-2 tablets (50-100 mg total) by mouth every 6 (six) hours as needed for moderate pain. 05/27/17   Richarda OverlieAbrol, Nayana, MD  venlafaxine XR (EFFEXOR-XR) 150 MG 24 hr capsule take 1 capsule by mouth at bedtime 06/15/17   [name], Stephen O, MD  vitamin C (ASCORBIC ACID) 500 MG tablet Take 500 mg daily by mouth.     [provider]  vitamin E (VITAMIN E) 1000 UNIT capsule Take 1,000 Units daily by mouth.     [provider]    Physical Exam: Vitals:   09/29/17 1800 09/29/17 1900 09/29/17 1942 09/29/17 2000  BP: (!) 154/82 (!) 157/64 135/76 (!) 164/72  Pulse: 92 96 98 96  Resp: (!) 28 19 (!) 26 (!) 26  Temp:      TempSrc:      SpO2: 100% 100% 100% 100%  Weight:      Height:          Constitutional: No respiratory distress, anxious, tremulous Eyes: PERTLA, lids and conjunctivae normal ENMT: Mucous membranes are moist. Posterior pharynx clear of any exudate or lesions.   Neck: supple, tender thyroid bed Respiratory: clear to auscultation bilaterally, no wheezing, no crackles. Mild tachypnea. No accessory muscle use.  Cardiovascular: Rate ~110 and regular. No extremity edema. No significant JVD. Abdomen: No distension, no tenderness, no masses palpated. Bowel sounds normal.  Musculoskeletal: no clubbing / cyanosis. No joint deformity upper and lower extremities.  Skin: no significant rashes, lesions, ulcers. Warm, dry, well-perfused. Neurologic: CN 2-12 grossly intact. Sensation intact. Strength 5/5 in all 4 limbs. Resting tremor involving bilateral UE's. Psychiatric: Alert, oriented to person, place, and situation, but intermittently confused. Coopperative.     Labs on Admission: I have personally reviewed following labs and imaging  studies  CBC: Recent Labs  Lab 09/29/17 1552  WBC 3.2*  HGB 11.6*  HCT 34.3*  MCV 92.2  PLT 259   Basic Metabolic Panel: Recent Labs  Lab 09/29/17 1552  NA 138  K 3.1*  CL 104  CO2 23  GLUCOSE 175*  BUN 14  CREATININE 0.75  CALCIUM 10.2   GFR: Estimated Creatinine Clearance: 57.6 mL/min (by C-G formula based on SCr of 0.75 mg/dL). Liver Function Tests: Recent Labs  Lab 09/29/17 1736  AST 39  ALT 55*  ALKPHOS 89  BILITOT 1.2  PROT 6.7  ALBUMIN 3.1*   No results for input(s): LIPASE, AMYLASE in the last 168 hours. No results for input(s): AMMONIA in the last 168 hours. Coagulation Profile: No results for input(s): INR, PROTIME in the last 168 hours. Cardiac Enzymes: No results for input(s): CKTOTAL, CKMB, CKMBINDEX, TROPONINI in the last 168 hours. BNP (last 3 results) Recent Labs    05/10/17 1324  PROBNP 289.0*   HbA1C: No results for input(s): HGBA1C in the last 72 hours. CBG: Recent Labs  Lab 09/29/17 1556  GLUCAP 175*   Lipid Profile: No results for input(s): CHOL, HDL, LDLCALC, TRIG, CHOLHDL, LDLDIRECT in the last 72 hours. Thyroid Function Tests: No results for input(s): TSH, T4TOTAL, FREET4, T3FREE, THYROIDAB in the last 72 hours. Anemia Panel: No results for input(s): VITAMINB12, FOLATE, FERRITIN, TIBC, IRON, RETICCTPCT in the last 72 hours. Urine analysis:    Component Value Date/Time   COLORURINE AMBER (A) 09/29/2017 1734   APPEARANCEUR CLEAR 09/29/2017 1734   LABSPEC 1.024 09/29/2017 1734   PHURINE 5.0 09/29/2017 1734   GLUCOSEU NEGATIVE 09/29/2017 1734   HGBUR NEGATIVE 09/29/2017 1734   BILIRUBINUR NEGATIVE 09/29/2017 1734   BILIRUBINUR n 05/10/2017 1507   KETONESUR NEGATIVE 09/29/2017 1734   PROTEINUR NEGATIVE 09/29/2017 1734   UROBILINOGEN 0.2 05/10/2017 1507   UROBILINOGEN 1.0 05/17/2014 0342   NITRITE NEGATIVE 09/29/2017 1734   LEUKOCYTESUR NEGATIVE 09/29/2017 1734   Sepsis  Labs: @LABRCNTIP (procalcitonin:4,lacticidven:4) )No results found for this or any previous visit (from the past 240 hour(s)).   Radiological Exams on Admission: Dg Chest 2 View  Result Date: 09/29/2017 CLINICAL DATA:  Syncope and chest pain EXAM: CHEST  2 VIEW COMPARISON:  Chest radiograph May 22, 2017 and chest CT May 22, 2017 FINDINGS: There is no edema or consolidation. Heart is borderline enlarged with pulmonary vascularity within normal limits. No adenopathy. There is aortic atherosclerosis. No evident bone lesions. IMPRESSION: Borderline cardiac enlargement. No edema or consolidation. There is aortic atherosclerosis. Aortic Atherosclerosis (ICD10-I70.0). Electronically Signed   By: Bretta Bang III M.D.   On: 09/29/2017 18:28   Ct Head Wo Contrast  Result Date: 09/29/2017 CLINICAL DATA:  Altered mental status with intermittent confusion. History of seizures EXAM: CT HEAD WITHOUT CONTRAST TECHNIQUE: Contiguous axial images were obtained from the base of the skull through the vertex without intravenous contrast. COMPARISON:  May 21, 2017 an September 10, 2011 FINDINGS: Brain: The ventricles are normal in size and configuration. There is no evident intracranial mass, hemorrhage, extra-axial fluid collection, or midline shift. There is slight small vessel disease in the anterior centra semiovale bilaterally. Elsewhere gray-white compartments appear normal. There is no evident acute infarct. Vascular: There is no hyperdense vessel. There is mild calcification in each carotid siphon. 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 No other bone lesions evident. Sinuses/Orbits: There is mild mucosal thickening in several ethmoid air cells. Other visualized paranasal sinuses are clear. Orbits appear symmetric bilaterally. Other: Mastoid air cells are clear. IMPRESSION: Slight periventricular small vessel disease. Gray-white compartments otherwise appear normal. No mass or hemorrhage. Stable  posterior left frontal osteoma. Bony calvarium otherwise unremarkable and stable. Areas of arterial vascular calcification noted. Mild mucosal thickening noted in several ethmoid air cells. Electronically Signed   By: Bretta Bang III M.D.   On: 09/29/2017 18:52    EKG: Independently reviewed. Sinus rhythm with diffuse repolarization abnormality.   Assessment/Plan  1. Thyrotoxicosis  - Pt presents with confusion, lightheadedness, and is noted to have pressured speech  - She is following with endocrinology, seen 1 week ago with worsening hyperthyroidism and told to increase Tapazole, but actually stopped it all together instead  - Check thyroid studies, resume Tapazole at 20 mg q6h, start propranalol 60 q6h, continue cardiac monitoring, continue supportive care, consider glucocorticoids if worsening    2. SIRS  - No fever on admission, but leukopenia, tachypnea, and lactic acid >4  - CXR and UA reviewed and not suggestive of infection  - Blood and urine cultures collected in ED  - Suspect a non-infectious etiology, but difficult to exclude a serious bacterial infection at this time and will treat with empiric broad-spectrum abx initially while following procalcitonin, cultures and clinical course   3. Elevated troponin; CAD  - Pt has mild, non-obstructive CAD on cath August 2018  - Presents with confusion and lightheadedness, reports intermittent palpitations, but no CP  - Troponin elevated to 0.39 on admission and EKG features diffuse repolarization abnormality  - Suspect this is driven by #1  - Treated with ASA 324 mg and Lopressor in ED  - Continue cardiac monitoring, trend troponin, repeat EKG, continue statin, beta-blocker, ASA, nitrates, and Tapazole    4. Hypertension - BP modestly elevated in ED, likely secondary to #1  - Started on propranolol as above, will titrate dose as needed   - Continue lisinopril   5. GERD  - No EGD on file  - Managed at home with daily PPI, will  continue    6. Hypokalemia  - Serum potassium 3.1 on admission - Increase her home potassium supplement and give 20 mEq now  - KCl added to IVF - Continue cardiac monitoring  DVT prophylaxis: Lovenox Code Status: Full  Family Communication: Discussed with patient Disposition Plan: Admit to SDU Consults called: None Admission status: Inpatient    Briscoe Deutscher, MD [facility] Pager [phone]  If 7PM-7AM, please contact night-coverage [email or web address] Password [facility]  09/29/2017, 8:18 PM

## 2017-09-29 NOTE — Progress Notes (Signed)
Pharmacy Antibiotic Note  Katherine ShamsCary Reynolds is a 71 y.o. female admitted on 09/29/2017 with sepsis.  Pharmacy has been consulted for Zosyn and vancomycin dosing. Patient presented with syncope and urinary frequency. WBC low at 3.2. LA 2.63. SCr 0.75. nCrCl ~ 70-75 mL/min.   Plan: -Zosyn 3.375 gm IV over 30 min x 1 dose, then Zosyn 3.375 gm IV Q 8 hours (EI infusion) -Vancomycin 1 gm IV once, then 750 mg IV q 12 hours -Monitor CBC, renal fx, cultures and clinical progress -VT at SS  Height: 5\' 6"  (167.6 cm) Weight: 123 lb (55.8 kg) IBW/kg (Calculated) : 59.3  Temp (24hrs), Avg:98.7 F (37.1 C), Min:98.7 F (37.1 C), Max:98.7 F (37.1 C)  Recent Labs  Lab 09/29/17 1552 09/29/17 1604 09/29/17 1823  WBC 3.2*  --   --   CREATININE 0.75  --   --   LATICACIDVEN  --  4.19* 2.63*    Estimated Creatinine Clearance: 57.6 mL/min (by C-G formula based on SCr of 0.75 mg/dL).    Allergies  Allergen Reactions  . Sulfa Drugs Cross Reactors Shortness Of Breath and Palpitations    Antimicrobials this admission: Vanc 12/5 >>  Zosyn 12/5 >>   Dose adjustments this admission: None   Microbiology results: 12/5 BCx:  12/5 UCx:    Thank you for allowing pharmacy to be a part of this patient's care.  Vinnie LevelBenjamin Eston Heslin, PharmD., BCPS Clinical Pharmacist Pager [phone]

## 2017-09-30 ENCOUNTER — Other Ambulatory Visit: Payer: Self-pay

## 2017-09-30 ENCOUNTER — Encounter (HOSPITAL_COMMUNITY): Payer: Self-pay | Admitting: *Deleted

## 2017-09-30 ENCOUNTER — Inpatient Hospital Stay (HOSPITAL_COMMUNITY): Payer: Medicare HMO

## 2017-09-30 DIAGNOSIS — I361 Nonrheumatic tricuspid (valve) insufficiency: Secondary | ICD-10-CM

## 2017-09-30 DIAGNOSIS — I248 Other forms of acute ischemic heart disease: Secondary | ICD-10-CM

## 2017-09-30 DIAGNOSIS — R7989 Other specified abnormal findings of blood chemistry: Secondary | ICD-10-CM

## 2017-09-30 LAB — CBC
HCT: 31 % — ABNORMAL LOW (ref 36.0–46.0)
HEMOGLOBIN: 10.1 g/dL — AB (ref 12.0–15.0)
MCH: 30 pg (ref 26.0–34.0)
MCHC: 32.6 g/dL (ref 30.0–36.0)
MCV: 92 fL (ref 78.0–100.0)
PLATELETS: 213 10*3/uL (ref 150–400)
RBC: 3.37 MIL/uL — AB (ref 3.87–5.11)
RDW: 15.3 % (ref 11.5–15.5)
WBC: 4.7 10*3/uL (ref 4.0–10.5)

## 2017-09-30 LAB — BASIC METABOLIC PANEL
ANION GAP: 7 (ref 5–15)
BUN: 9 mg/dL (ref 6–20)
CALCIUM: 9.6 mg/dL (ref 8.9–10.3)
CO2: 23 mmol/L (ref 22–32)
CREATININE: 0.59 mg/dL (ref 0.44–1.00)
Chloride: 109 mmol/L (ref 101–111)
Glucose, Bld: 102 mg/dL — ABNORMAL HIGH (ref 65–99)
Potassium: 3.8 mmol/L (ref 3.5–5.1)
SODIUM: 139 mmol/L (ref 135–145)

## 2017-09-30 LAB — ECHOCARDIOGRAM COMPLETE
Height: 66 in
WEIGHTICAEL: 1958.4 [oz_av]

## 2017-09-30 LAB — URINE CULTURE: Culture: NO GROWTH

## 2017-09-30 LAB — HEPARIN LEVEL (UNFRACTIONATED)
HEPARIN UNFRACTIONATED: 0.1 [IU]/mL — AB (ref 0.30–0.70)
HEPARIN UNFRACTIONATED: 0.17 [IU]/mL — AB (ref 0.30–0.70)

## 2017-09-30 LAB — TROPONIN I
TROPONIN I: 0.51 ng/mL — AB (ref <0.03)
Troponin I: 0.37 ng/mL (ref <0.03)

## 2017-09-30 MED ORDER — AMLODIPINE BESYLATE 10 MG PO TABS
10.0000 mg | ORAL_TABLET | Freq: Every day | ORAL | Status: DC
  Administered 2017-09-30 – 2017-10-04 (×5): 10 mg via ORAL
  Filled 2017-09-30 (×5): qty 1

## 2017-09-30 MED ORDER — HEPARIN BOLUS VIA INFUSION
1500.0000 [IU] | Freq: Once | INTRAVENOUS | Status: AC
  Administered 2017-09-30: 1500 [IU] via INTRAVENOUS
  Filled 2017-09-30: qty 1500

## 2017-09-30 MED ORDER — HYDROCORTISONE NA SUCCINATE PF 100 MG IJ SOLR
100.0000 mg | Freq: Three times a day (TID) | INTRAMUSCULAR | Status: DC
  Administered 2017-09-30 – 2017-10-02 (×7): 100 mg via INTRAVENOUS
  Filled 2017-09-30 (×7): qty 2

## 2017-09-30 MED ORDER — BISMUTH SUBSALICYLATE 262 MG/15ML PO SUSP
30.0000 mL | ORAL | Status: DC | PRN
  Administered 2017-09-30 – 2017-10-04 (×2): 30 mL via ORAL
  Filled 2017-09-30: qty 118

## 2017-09-30 MED ORDER — ENSURE ENLIVE PO LIQD
237.0000 mL | Freq: Three times a day (TID) | ORAL | Status: DC
  Administered 2017-09-30 – 2017-10-04 (×10): 237 mL via ORAL

## 2017-09-30 NOTE — Discharge Instructions (Signed)
Nutrition Post Hospital Stay Proper nutrition can help your body recover from illness and injury.   Foods and beverages high in protein, vitamins, and minerals help rebuild muscle loss, promote healing, & reduce fall risk.   .In addition to eating healthy foods, a nutrition shake is an easy, delicious way to get the nutrition you need during and after your hospital stay  It is recommended that you continue to drink 2 bottles per day of:       Ensure for at least 1 month (30 days) after your hospital stay   Tips for adding a nutrition shake into your routine: As allowed, drink one with vitamins or medications instead of water or juice Enjoy one as a tasty mid-morning or afternoon snack Drink cold or make a milkshake out of it Drink one instead of milk with cereal or snacks Use as a coffee creamer   Available at the following grocery stores and pharmacies:           * [employer] * [employer] * [employer]  * R[employer]          * Walmart * [employer]  * [facility]      * Target  * [employer]J's   * [facility]  * [employer]   * [facility] [phone]            For COUPONS visit: www.ensure.com/join or [email or web address]/members/sign-[email or web address]   Suggested Substitutions Ensure Plus = Boost Plus = Carnation Breakfast Essentials = Boost Compact Ensure Active Clear = Boost Breeze Glucerna Shake = Boost Glucose Control = Carnation Breakfast Essentials SUGAR FREE       

## 2017-09-30 NOTE — Telephone Encounter (Signed)
Thanks for update. She was in delirium from thyrotoxicosis so going to ER made since in this case

## 2017-09-30 NOTE — Progress Notes (Signed)
ANTICOAGULATION CONSULT NOTE  Pharmacy Consult for Heparin Indication: chest pain/ACS  Allergies  Allergen Reactions  . Sulfa Drugs Cross Reactors Shortness Of Breath and Palpitations    Patient Measurements: Height: 5\' 6"  (167.6 cm) Weight: 122 lb 6.4 oz (55.5 kg) IBW/kg (Calculated) : 59.3  Vital Signs: Temp: 99.2 F (37.3 C) (12/06 1626) Temp Source: Oral (12/06 1626) BP: 140/82 (12/06 1626) Pulse Rate: 75 (12/06 1626)  Labs: Recent Labs    09/29/17 1552 09/29/17 2014 09/30/17 0323 09/30/17 0700 09/30/17 1643  HGB 11.6*  --   --  10.1*  --   HCT 34.3*  --   --  31.0*  --   PLT 259  --   --  213  --   APTT  --  25  --   --   --   LABPROT  --  16.8*  --   --   --   INR  --  1.38  --   --   --   HEPARINUNFRC  --   --   --  0.10* 0.17*  CREATININE 0.75  --   --  0.59  --   TROPONINI  --  0.47* 0.51* 0.37*  --     Estimated Creatinine Clearance: 57.3 mL/min (by C-G formula based on SCr of 0.59 mg/dL).   Medical History: Past Medical History:  Diagnosis Date  . Arthritis   . Chronic lower back pain   . Cluster headache   . Coronary artery disease    a. 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 non obs dz LHC 2007. c. [facility] d. cath 05/26/17 mild non obstructive CAD  . Diverticulitis    a. Hx microperf 2012 - hospitalizated.  Marland Kitchen. GERD (gastroesophageal reflux disease)   . Hypercholesteremia   . Hypertension   . PVC's (premature ventricular contractions)    a. Hx of trigeminy/bigeminy.  . Seizures (HCC)   . Thyroid disease    hyperthyroid  . UTI (lower urinary tract infection)     Assessment: 71 YOF continuing on IV heparin for ACS. Hg down to 10.1, Plt wnl, trop elevated. Pt was not on any anticoagulation at home.  Follow up heparin level still below goal at 0.17. No bleeding or IV line issues per RN.  Goal of Therapy:  Heparin level 0.3-0.7 units/ml Monitor platelets by anticoagulation protocol: Yes   Plan:  -Repeat Heparin 1500 units  bolus x 1 -Increase heparin infusion to 1000 units/hr -F/u 8 hr heparin level -Monitor daily heparin level, CBC and s/s of bleeding  Katherine CoilFrank Ying Reynolds PharmD., BCPS Clinical Pharmacist Pager [phone] 09/30/2017 5:30 PM
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
## 2017-09-30 NOTE — Progress Notes (Signed)
*  PRELIMINARY RESULTS* Echocardiogram 2D Echocardiogram has been performed.  Jeryl Columbialliott, Soloman Mckeithan 09/30/2017, 3:13 PM

## 2017-09-30 NOTE — Consult Note (Signed)
Cardiology Consult    Patient ID: [name] MRN: [id number], DOB/AGE: 1946/08/13   Admit date: 09/29/2017 Date of Consult: 09/30/2017  Primary Physician: Shelva Majestic, MD Primary Cardiologist: Katrinka Blazing Requesting Provider: Sunnie Nielsen Reason for Consultation: Chest pain, +Trop  [name] is a 71 y.o. female who is being seen today for the evaluation of chest pain/+ Trop at the request of Dr. Sunnie Nielsen.   Patient Profile    71 yo female with PMH of nonobstructive CAD with coronary spasm, HTN, HL, Grave's Disease and multinodular goiter who presented with confusion and lightheadedness.   Past Medical History   Past Medical History:  Diagnosis Date  . Arthritis   . Chronic lower back pain   . Cluster headache   . Coronary artery disease    a. 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 non obs dz LHC 2007. c. Non obs dz (mild-mod) by [facility] 05/17/14 d. cath 05/26/17 mild non obstructive CAD  . Diverticulitis    a. Hx microperf 2012 - hospitalizated.  Marland Kitchen GERD (gastroesophageal reflux disease)   . Hypercholesteremia   . Hypertension   . PVC's (premature ventricular contractions)    a. Hx of trigeminy/bigeminy.  . Seizures (HCC)   . Thyroid disease    hyperthyroid  . UTI (lower urinary tract infection)     Past Surgical History:  Procedure Laterality Date  . ABDOMINAL HYSTERECTOMY  1987   "partial"-still has ovaries  . CARDIAC CATHETERIZATION  05/17/2014  . CORONARY ANGIOPLASTY WITH STENT PLACEMENT  1995   "1"  . JOINT REPLACEMENT     right knee  . LEFT HEART CATH AND CORONARY ANGIOGRAPHY N/A 05/26/2017   Procedure: Left Heart Cath and Coronary Angiography;  Surgeon: Corky Crafts, MD;  Location: [facility] INVASIVE CV LAB;  Service: Cardiovascular;  Laterality: N/A;  . LEFT HEART CATHETERIZATION WITH CORONARY ANGIOGRAM N/A 05/17/2014   Procedure: LEFT HEART CATHETERIZATION WITH CORONARY ANGIOGRAM;  Surgeon: Marykay Lex, MD;  [facility] C[facility] LAB;  Service: Cardiovascular;  Laterality: N/A;    . TOTAL KNEE ARTHROPLASTY Right 2005  . TUBAL LIGATION  1970  . VASCULAR SURGERY       Allergies  Allergies  Allergen Reactions  . Sulfa Drugs Cross Reactors Shortness Of Breath and Palpitations    History of Present Illness    [name] is a 71 yo female with PMH of nonobstructive CAD with coronary spasm, HTN, HL, Grave's Disease and multinodular goiter.  She is followed by Dr. Everardo All with endocrinology.  She was admitted from 05/22/17 to 05/27/17 with subacute thyroiditis and a end STEMI with a peak troponin of 0.27.  CT of the chest was negative for PE, and echo showed EF of 55% with hypokinesis.  Baseline EKG was abnormal, and underwent abnormal stress test with small area of ischemia in the apical anterior wall, and moderate ischemia in the basal/mid inferior walls.  Underwent cath at that time which showed mild nonobstructive disease and elevated troponin was felt to be secondary to demand ischemia.  She was seen again by her PCP 08/06/17 for atypical chest pain, and was referred to cardiology.  She was seen in the office on 09/02/17 for follow-up.  At that time she was requested to switch her atenolol to Coreg which was adjusted by her endocrinologist.  She did report intermittent episodes of dyspnea, without chest discomfort.  States she used nitroglycerin for these episodes which would resolve her symptoms.  She was continued on amlodipine, Imdur and atenolol and advised to  follow-up with her endocrinologist about whether to switch her beta-blocker.  She reports being in her usual state of health, until several days prior to admission.  States she has been seen by her endocrinologist approximately 1 week prior to admission with worsening hyperthyroidism and had her Tapazole increased.  Patient reported she was confused and actually stopped her Tapazole, even before she had been seen at this follow-up appointment.  Since that time she had become increasingly confused, dizzy and  lightheaded.  Reported frequent palpitations, and intermittent chest discomfort.  She was brought to the ED on 09/29/17 and was found to be confused.  Her EKG showed sinus rhythm with PVCs and diffuse repolarization abnormalities.  Chest x-ray was negative.  CT of her head showed periventricular small vessel disease but no acute findings.  Her labs showed stable electrolytes with the exception of hypokalemia with potassium of 3.1, lactic acid 4.19, hemoglobin 11.6.  TSH was<0.010, free T4 4.04.  Troponin 0.47>>0.51>>0.37.  In talking with patient she reports she does have intermittent chest pain at home that is quickly relieved with nitroglycerin.  States this is not out of her normal to have these episodes, and have been associated with her coronary vasospasm.  Inpatient Medications    . aspirin EC  81 mg Oral Daily  . atorvastatin  20 mg Oral q1800  . cholecalciferol  1,000 Units Oral Daily  . ezetimibe  10 mg Oral Daily  . feeding supplement (ENSURE ENLIVE)  237 mL Oral TID BM  . hydrocortisone sod succinate (SOLU-CORTEF) inj  100 mg Intravenous Q8H  . isosorbide mononitrate  120 mg Oral Daily  . lisinopril  20 mg Oral Daily  . methimazole  20 mg Oral Q6H  . oxybutynin  10 mg Oral Daily  . pantoprazole  40 mg Oral Daily  . potassium chloride SA  20 mEq Oral TID  . propranolol  60 mg Oral Q6H  . venlafaxine XR  150 mg Oral QHS    Family History    Family History  Problem Relation Age of Onset  . CAD Brother   . Diabetes Brother   . CAD Father   . Diabetes Father   . Diabetes Mother        father, sister, brothers  . Diabetes Sister   . Thyroid disease Sister     Social History    Social History   Socioeconomic History  . Marital status: Married    Spouse name: Not on file  . Number of children: 4  . Years of education: 4  . Highest education level: Not on file  Social Needs  . Financial resource strain: Not on file  . Food insecurity - worry: Not on file  . Food  insecurity - inability: Not on file  . Transportation needs - medical: Not on file  . Transportation needs - non-medical: Not on file  Occupational History    Comment: retired  Tobacco Use  . Smoking status: Never Smoker  . Smokeless tobacco: Never Used  Substance and Sexual Activity  . Alcohol use: No  . Drug use: No  . Sexual activity: No  Other Topics Concern  . Not on file  Social History Narrative   Separated. 4 children from first marriage. 16 grandkids.       Retired from [employer] for [employer] years, went to [employer] for 5 years. Retired 2003 after MI      Hobbies: babysit/active with children      Right-handed  Caffeine: 24 oz soda per day        Review of Systems    See HPI All other systems reviewed and are otherwise negative except as noted above.  Physical Exam    Blood pressure (!) 147/91, pulse 81, temperature 98.8 F (37.1 C), temperature source Oral, resp. rate (!) 23, height 5\' 6"  (1.676 m), weight 122 lb 6.4 oz (55.5 kg), SpO2 100 %.  General: Pleasant,older AAF, NAD Psych: Normal affect. Neuro: Alert and oriented X 3. Moves all extremities spontaneously. HEENT: Normal  Neck: Supple without bruits or JVD. Lungs:  Resp regular and unlabored, CTA. Heart: RRR no s3, s4, 2/6 systolic murmur. Abdomen: Soft, non-tender, non-distended, BS + x 4.  Extremities: No clubbing, cyanosis or edema. DP/PT/Radials 2+ and equal bilaterally.  Labs    Troponin [facility] Test) Recent Labs    09/29/17 1820  TROPIPOC 0.39*   Recent Labs    09/29/17 2014 09/30/17 0323 09/30/17 0700  TROPONINI 0.47* 0.51* 0.37*   Lab Results  Component Value Date   WBC 4.7 09/30/2017   HGB 10.1 (L) 09/30/2017   HCT 31.0 (L) 09/30/2017   MCV 92.0 09/30/2017   PLT 213 09/30/2017    Recent Labs  Lab 09/29/17 1736 09/30/17 0700  NA  --  139  K  --  3.8  CL  --  109  CO2  --  23  BUN  --  9  CREATININE  --  0.59  CALCIUM  --  9.6  PROT 6.7  --   BILITOT 1.2  --     ALKPHOS 89  --   ALT 55*  --   AST 39  --   GLUCOSE  --  102*   Lab Results  Component Value Date   CHOL 75 05/23/2017   HDL 25 (L) 05/23/2017   LDLCALC 38 05/23/2017   TRIG 58 05/23/2017   Lab Results  Component Value Date   DDIMER 1.42 (H) 05/22/2017     Radiology Studies    Dg Chest 2 View  Result Date: 09/29/2017 CLINICAL DATA:  Syncope and chest pain EXAM: CHEST  2 VIEW COMPARISON:  Chest radiograph May 22, 2017 and chest CT May 22, 2017 FINDINGS: There is no edema or consolidation. Heart is borderline enlarged with pulmonary vascularity within normal limits. No adenopathy. There is aortic atherosclerosis. No evident bone lesions. IMPRESSION: Borderline cardiac enlargement. No edema or consolidation. There is aortic atherosclerosis. Aortic Atherosclerosis (ICD10-I70.0). Electronically Signed   By: Bretta Bang III M.D.   On: 09/29/2017 18:28   Ct Head Wo Contrast  Result Date: 09/29/2017 CLINICAL DATA:  Altered mental status with intermittent confusion. History of seizures EXAM: CT HEAD WITHOUT CONTRAST TECHNIQUE: Contiguous axial images were obtained from the base of the skull through the vertex without intravenous contrast. COMPARISON:  May 21, 2017 an September 10, 2011 FINDINGS: Brain: The ventricles are normal in size and configuration. There is no evident intracranial mass, hemorrhage, extra-axial fluid collection, or midline shift. There is slight small vessel disease in the anterior centra semiovale bilaterally. Elsewhere gray-white compartments appear normal. There is no evident acute infarct. Vascular: There is no hyperdense vessel. There is mild calcification in each carotid siphon. 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 No other bone lesions evident. Sinuses/Orbits: There is mild mucosal thickening in several ethmoid air cells. Other visualized paranasal sinuses are clear. Orbits appear symmetric bilaterally. Other: Mastoid air cells are clear.  IMPRESSION: Slight periventricular small  vessel disease. Gray-white compartments otherwise appear normal. No mass or hemorrhage. Stable posterior left frontal osteoma. Bony calvarium otherwise unremarkable and stable. Areas of arterial vascular calcification noted. Mild mucosal thickening noted in several ethmoid air cells. Electronically Signed   By: Bretta BangWilliam  Woodruff III M.D.   On: 09/29/2017 18:52    ECG & Cardiac Imaging    EKG: 12/5 SR with diffuse ST depression, 12/6 SR with deep anterior TWI  Echo: 7/18  Study Conclusions  - Left ventricle: The cavity size was normal. Wall thickness was   increased in a pattern of mild LVH. The estimated ejection   fraction was 55%. There is mild hypokinesis of the basalinferior   myocardium. Features are consistent with a pseudonormal left   ventricular filling pattern, with concomitant abnormal relaxation   and increased filling pressure (grade 2 diastolic dysfunction). - Aortic valve: Mildly calcified annulus. Trileaflet. - Mitral valve: Mildly thickened leaflets . There was mild to   moderate regurgitation. - Left atrium: The atrium was severely dilated. - Right atrium: Central venous pressure (est): 8 mm Hg. - Atrial septum: No defect or patent foramen ovale was identified. - Tricuspid valve: There was mild regurgitation. - Pulmonary arteries: PA peak pressure: 46 mm Hg (S). - Pericardium, extracardiac: There was no pericardial effusion.  Impressions:  - Mild LVH with LVEF approximately 55%. Mild hypokinesis of the   basal inferior wall. Grade 2 diastolic dysfunction. Severe left   atrial enlargement. Mildly thickened mitral leaflets with mild to   moderate mitral regurgitation. Mild tricuspid regurgitation with   PASP estimated 46 mmHg.   Cath: 8/18  Conclusion     Prox RCA to Mid RCA lesion, 10 %stenosed.  Dist Cx lesion, 20 %stenosed.  Mid LAD lesion, 10 %stenosed.  There is no aortic valve stenosis.  Ost 1st Mrg  lesion, 25 %stenosed.  The left ventricular systolic function is normal.  LV end diastolic pressure is normal.  The left ventricular ejection fraction is 55-65% by visual estimate.   No obstructive CAD.  Likely demand ischemia.  Continue preventive therapy.    Assessment & Plan    71 yo female with PMH of nonobstructive CAD with coronary spasm, HTN, HL, Grave's Disease and multinodular goiter who presented with confusion and lightheadedness.  1. Elevated Troponin: Patient has known hx of nonobstructive CAD with coronary vasospasm. She is on medical therapy at home with amlodipine, Imdur and BB. Reports taking nitro at home with relief of symptoms when they occur. Cath 8/18 with very minimal CAD. EF was normal on echo 7/18. Did have some EKG changes noted above, but suspect this is related to her thyroid dysfunction.  -- echo -- recheck EKG in the am -- resume amlodipine  2. Thyrotoxicosis: TSH <0.010. Pt reports stopping her medication out of confusion about 2-3 weeks prior to admission. Management per primary  3. HTN: Stable, will add back amlodipine  4. Hypokalemia: resolved  Janice CoffinSigned, Lindsay Roberts, NP-C Pager [phone] 09/30/2017, 12:17 PM  Personally seen and examined. Agree with above.  71 year old female with nonobstructive coronary artery disease on recent heart catheterization 3 months ago, minimal abnormalities, history of coronary vasospasm here with thyrotoxicosis secondary to discontinuation of Tapazole with EKG changes, T wave inversions noted deep anterior precordial leads, somewhat biphasic, also prior EKG showing diffuse mild ST segment depression as well.  Troponin minimally elevated as it was previously.  She is currently chest pain-free.  She has had intermittent chest discomfort.  Physical exam: Alert, mildly jittery  likely from elevated thyroid hormone, lungs are clear, heart is regular, no edema.  Prior cardiac catheterization reviewed as above.  Lab work  personally reviewed as above.  Elevated troponin/intermittent chest pain - Could be secondary to demand ischemia in the setting of her metabolic derangement from hyperthyroidism.  Seems to be a complementary presentation to August when she had her last cardiac catheterization that thankfully was reassuring.  Her EKGs especially the last 1 does appear concerning however given her recent cardiac cath workup I would not pursue invasive management unless her troponin significantly changed or if her chest pain became severe and persistent. -We will resume her amlodipine 10 mg to help with coronary vasospasm and we will also continue her isosorbide 120 mg, peak dose.  It is okay to continue with propranolol nonselective beta-blocker as well. -I think it makes sense as well to repeat echocardiogram given her EKG changes.  Thyrotoxicosis - Back on Tapazole.  Sees endocrinology.  We will follow.  Donato Schultz, MD

## 2017-09-30 NOTE — Progress Notes (Signed)
PROGRESS NOTE    [name] Redlich  UJW:[id number] DOB: 06/24/1946 DOA: 09/29/2017 PCP: [name], Stephen O, MD    Brief Narrative:  [name] Gensel is a 71 y.o. female with medical history significant for coronary artery disease, GERD, hypertension, and hyperthyroidism followed by endocrinology, now presenting to the emergency department for evaluation of confusion and lightheadedness for the past several days.  Patient had been seen by her endocrinologist 1 week ago with worsening hyperthyroidism and had her Tapazole increased.  Due to some confusion, patient actually stopped her Tapazole at that time and has not taken any for the past week.  Over this interval, she has become increasingly confused and "dizzy," described as a lightheadedness. She also reports palpitations, but denies chest pain. Denies dyspnea.      Assessment & Plan:   Principal Problem:   Thyrotoxicosis Active Problems:   HTN (hypertension)   GERD (gastroesophageal reflux disease)   CAD (coronary artery disease)   Hypokalemia   Hyperthyroidism   Elevated lactic acid level   Elevated troponin   SIRS (systemic inflammatory response syndrome) (HCC)   Leukopenia   1-Thyrotoxicosis; presents with confusion, tremors, palpitation.  Continue with propanolol, methimazole.  Start IV hydrocortisone.  TSH 0.010, free T 4; 4.  2-N-STEMI; Elevated troponin;   Complaints of chest pain this am.  EKG with pronounce T wave inversion V 2-3 Continue with Heparin gtt.  Plan to repeat EKG and will received nitroglycerin.  Cardiology consulted.  On aspirin, statins.   3-SIRS; suspect related to number one.  Lactic acid normalized.  Follow Blood culture and urine culture.  If MRSA PCR negative will stop vancomycin.   Syncope; in setting of thyrotoxicosis, N-stemi ECHO ordered.  HTN; on propanolol. Continue with lisinopril.   GERD; on protonix.   Hypokalemia;  Resolved.    DVT prophylaxis: heparin gtt.  Code Status: full  code.  Family Communication: care discussed with patient.  Disposition Plan: remain in patient.    Consultants:   Cardiology   Procedures: ECHO Antimicrobials:  Vancomycin. Zosyn.   Subjective: She is alert complaining of chest pain dull pain and heaviness. She has been having chest pain at home on and off.  She is oriented to person, place. Mildly confuse still.   Objective: Vitals:   09/30/17 0045 09/30/17 0119 09/30/17 0311 09/30/17 0833  BP: (!) 155/81 (!) 151/84 (!) 143/82 (!) 147/91  Pulse: 82 83 80 81  Resp: 19 (!) 23 (!) 24 (!) 23  Temp:  98.7 F (37.1 C) 98.6 F (37 C) 98.8 F (37.1 C)  TempSrc:  Oral Oral Oral  SpO2: 100% 100% 100% 100%  Weight:  55.5 kg (122 lb 6.4 oz)    Height:  5\' 6"  (1.676 m)      Intake/Output Summary (Last 24 hours) at 09/30/2017 0918 Last data filed at 09/30/2017 0600 Gross per 24 hour  Intake 2921.22 ml  Output 150 ml  Net 2771.22 ml   Filed Weights   09/29/17 1547 09/30/17 0119  Weight: 55.8 kg (123 lb) 55.5 kg (122 lb 6.4 oz)    Examination:  General exam: Appears calm and comfortable  Respiratory system: Clear to auscultation. Respiratory effort normal. Cardiovascular system: S1 & S2 heard, RRR. No JVD, murmurs, rubs, gallops or clicks. No pedal edema. Gastrointestinal system: Abdomen is nondistended, soft and nontender. No organomegaly or masses felt. Normal bowel sounds heard. Central nervous system: Alert and oriented. No focal neurological deficits. Mild tremors.  Extremities: Symmetric 5 x 5 power. Skin: No  rashes, lesions or ulcers   Data Reviewed: I have personally reviewed following labs and imaging studies  CBC: Recent Labs  Lab 09/29/17 1552 09/30/17 0700  WBC 3.2* 4.7  HGB 11.6* 10.1*  HCT 34.3* 31.0*  MCV 92.2 92.0  PLT 259 213   Basic Metabolic Panel: Recent Labs  Lab 09/29/17 1552 09/30/17 0700  NA 138 139  K 3.1* 3.8  CL 104 109  CO2 23 23  GLUCOSE 175* 102*  BUN 14 9  CREATININE 0.75  0.59  CALCIUM 10.2 9.6   GFR: Estimated Creatinine Clearance: 57.3 mL/min (by C-G formula based on SCr of 0.59 mg/dL). Liver Function Tests: Recent Labs  Lab 09/29/17 1736  AST 39  ALT 55*  ALKPHOS 89  BILITOT 1.2  PROT 6.7  ALBUMIN 3.1*   No results for input(s): LIPASE, AMYLASE in the last 168 hours. No results for input(s): AMMONIA in the last 168 hours. Coagulation Profile: Recent Labs  Lab 09/29/17 2014  INR 1.38   Cardiac Enzymes: Recent Labs  Lab 09/29/17 2014 09/30/17 0323 09/30/17 0700  TROPONINI 0.47* 0.51* 0.37*   BNP (last 3 results) Recent Labs    05/10/17 1324  PROBNP 289.0*   HbA1C: No results for input(s): HGBA1C in the last 72 hours. CBG: Recent Labs  Lab 09/29/17 1556  GLUCAP 175*   Lipid Profile: No results for input(s): CHOL, HDL, LDLCALC, TRIG, CHOLHDL, LDLDIRECT in the last 72 hours. Thyroid Function Tests: Recent Labs    09/29/17 2014  TSH <0.010*  FREET4 4.04*   Anemia Panel: No results for input(s): VITAMINB12, FOLATE, FERRITIN, TIBC, IRON, RETICCTPCT in the last 72 hours. Sepsis Labs: Recent Labs  Lab 09/29/17 1604 09/29/17 1823 09/29/17 2014  PROCALCITON  --   --  <0.10  LATICACIDVEN 4.19* 2.63* 1.5    No results found for this or any previous visit (from the past 240 hour(s)).       Radiology Studies: Dg Chest 2 View  Result Date: 09/29/2017 CLINICAL DATA:  Syncope and chest pain EXAM: CHEST  2 VIEW COMPARISON:  Chest radiograph May 22, 2017 and chest CT May 22, 2017 FINDINGS: There is no edema or consolidation. Heart is borderline enlarged with pulmonary vascularity within normal limits. No adenopathy. There is aortic atherosclerosis. No evident bone lesions. IMPRESSION: Borderline cardiac enlargement. No edema or consolidation. There is aortic atherosclerosis. Aortic Atherosclerosis (ICD10-I70.0). Electronically Signed   By: Bretta Bang III M.D.   On: 09/29/2017 18:28   Ct Head Wo Contrast  Result  Date: 09/29/2017 CLINICAL DATA:  Altered mental status with intermittent confusion. History of seizures EXAM: CT HEAD WITHOUT CONTRAST TECHNIQUE: Contiguous axial images were obtained from the base of the skull through the vertex without intravenous contrast. COMPARISON:  May 21, 2017 an September 10, 2011 FINDINGS: Brain: The ventricles are normal in size and configuration. There is no evident intracranial mass, hemorrhage, extra-axial fluid collection, or midline shift. There is slight small vessel disease in the anterior centra semiovale bilaterally. Elsewhere gray-white compartments appear normal. There is no evident acute infarct. Vascular: There is no hyperdense vessel. There is mild calcification in each carotid siphon. 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 No other bone lesions evident. Sinuses/Orbits: There is mild mucosal thickening in several ethmoid air cells. Other visualized paranasal sinuses are clear. Orbits appear symmetric bilaterally. Other: Mastoid air cells are clear. IMPRESSION: Slight periventricular small vessel disease. Gray-white compartments otherwise appear normal. No mass or hemorrhage. Stable posterior left frontal osteoma.  Bony calvarium otherwise unremarkable and stable. Areas of arterial vascular calcification noted. Mild mucosal thickening noted in several ethmoid air cells. Electronically Signed   By: Bretta BangWilliam  Woodruff III M.D.   On: 09/29/2017 18:52        Scheduled Meds: . aspirin EC  81 mg Oral Daily  . atorvastatin  20 mg Oral q1800  . cholecalciferol  1,000 Units Oral Daily  . ezetimibe  10 mg Oral Daily  . feeding supplement (ENSURE ENLIVE)  237 mL Oral BID BM  . heparin  1,500 Units Intravenous Once  . isosorbide mononitrate  120 mg Oral Daily  . lisinopril  20 mg Oral Daily  . methimazole  20 mg Oral Q6H  . oxybutynin  10 mg Oral Daily  . pantoprazole  40 mg Oral Daily  . potassium chloride SA  20 mEq Oral TID  . propranolol  60 mg  Oral Q6H  . venlafaxine XR  150 mg Oral QHS   Continuous Infusions: . heparin 650 Units/hr (09/30/17 0132)  . piperacillin-tazobactam (ZOSYN)  IV Stopped (09/30/17 0759)  . vancomycin       LOS: 1 day    Time spent: 35 minutes.     Alba CoryBelkys A Regalado, MD [facility] Pager [phone]  If 7PM-7AM, please contact night-coverage [email or web address] Password [email or web address] 09/30/2017, 9:18 AM

## 2017-09-30 NOTE — Progress Notes (Signed)
ANTICOAGULATION CONSULT NOTE  Pharmacy Consult for Heparin Indication: chest pain/ACS  Allergies  Allergen Reactions  . Sulfa Drugs Cross Reactors Shortness Of Breath and Palpitations    Patient Measurements: Height: 5\' 6"  (167.6 cm) Weight: 122 lb 6.4 oz (55.5 kg) IBW/kg (Calculated) : 59.3  Vital Signs: Temp: 98.8 F (37.1 C) (12/06 0833) Temp Source: Oral (12/06 0833) BP: 147/91 (12/06 0833) Pulse Rate: 81 (12/06 0833)  Labs: Recent Labs    09/29/17 1552 09/29/17 2014 09/30/17 0323 09/30/17 0700  HGB 11.6*  --   --  10.1*  HCT 34.3*  --   --  31.0*  PLT 259  --   --  213  APTT  --  25  --   --   LABPROT  --  16.8*  --   --   INR  --  1.38  --   --   HEPARINUNFRC  --   --   --  0.10*  CREATININE 0.75  --   --  0.59  TROPONINI  --  0.47* 0.51* 0.37*    Estimated Creatinine Clearance: 57.3 mL/min (by C-G formula based on SCr of 0.59 mg/dL).   Medical History: Past Medical History:  Diagnosis Date  . Arthritis   . Chronic lower back pain   . Cluster headache   . Coronary artery disease    a. 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 non obs dz LHC 2007. c. [facility] d. cath 05/26/17 mild non obstructive CAD  . Diverticulitis    a. Hx microperf 2012 - hospitalizated.  Marland Kitchen. GERD (gastroesophageal reflux disease)   . Hypercholesteremia   . Hypertension   . PVC's (premature ventricular contractions)    a. Hx of trigeminy/bigeminy.  . Seizures (HCC)   . Thyroid disease    hyperthyroid  . UTI (lower urinary tract infection)     Medications:  Medications Prior to Admission  Medication Sig Dispense Refill Last Dose  . acetaminophen (TYLENOL) 325 MG tablet Take 650 mg by mouth every 6 (six) hours as needed for moderate pain.    prn  . Aloe Vera 25 MG CAPS Take 25 mg by mouth daily.   Past Week at Unknown time  . amLODipine (NORVASC) 10 MG tablet take 1 tablet by mouth once daily (Patient taking differently: take 1 tablet(10mg ) by mouth once daily) 30  tablet 5 Past Week at Unknown time  . aspirin EC 81 MG tablet Take 81 mg daily by mouth.   Past Week at Unknown time  . atenolol (TENORMIN) 50 MG tablet Take 1 tablet (50 mg total) by mouth daily. 60 tablet 2 Past Week at Unknown time  . atorvastatin (LIPITOR) 20 MG tablet take 1 tablet by mouth once daily (Patient taking differently: take 1 tablet (20mg ) by mouth once daily) 90 tablet 1 Past Week at Unknown time  . cholecalciferol (VITAMIN D) 400 UNITS TABS tablet Take 400 Units daily by mouth.    Past Week at Unknown time  . Cyanocobalamin (VITAMIN B 12 PO) Take 1 capsule daily by mouth.    Past Week at Unknown time  . esomeprazole (NEXIUM) 40 MG capsule Take 1 capsule (40 mg total) by mouth daily. 90 capsule 3 Past Week at Unknown time  . ezetimibe (ZETIA) 10 MG tablet take 1 tablet by mouth once daily (Patient taking differently: take 1 tablet(10mg ) by mouth once daily) 30 tablet 5 Past Week at Unknown time  . feeding supplement, ENSURE ENLIVE, (ENSURE ENLIVE)  LIQD Take 237 mLs by mouth 2 (two) times daily between meals. 237 mL 12 Past Week at Unknown time  . ferrous sulfate 325 (65 FE) MG tablet Take 1 tablet (325 mg total) by mouth daily. 30 tablet 0 Past Week at Unknown time  . isosorbide mononitrate (IMDUR) 60 MG 24 hr tablet take 2 tablets by mouth once daily (Patient taking differently: take 2 tablets(120mg ) by mouth once daily) 60 tablet 5 Past Week at Unknown time  . lisinopril (PRINIVIL,ZESTRIL) 20 MG tablet take 1 tablet by mouth once daily (Patient taking differently: take 1 tablet(20mg ) by mouth once daily) 90 tablet 1 Past Week at Unknown time  . methimazole (TAPAZOLE) 10 MG tablet Take 4 tablets (40 mg total) by mouth 2 (two) times daily. 240 tablet 5 Past Month at Unknown time  . nitroGLYCERIN (NITROSTAT) 0.4 MG SL tablet PLACE 1 TABLET UNDER THE TONGUE EVERY 5 MINUTES FOR CHEST PAIN FOR 3 DOSES 25 tablet 1 prn  . oxybutynin (DITROPAN-XL) 10 MG 24 hr tablet Take 1 tablet (10 mg  total) by mouth daily. 90 tablet 3 Past Week at Unknown time  . potassium chloride (KLOR-CON 10) 10 MEQ tablet Take 1 tablet (10 mEq total) by mouth 2 (two) times daily. 60 tablet 5 Past Week at Unknown time  . traMADol (ULTRAM) 50 MG tablet Take 1-2 tablets (50-100 mg total) by mouth every 6 (six) hours as needed for moderate pain. 30 tablet 0 prn  . venlafaxine XR (EFFEXOR-XR) 150 MG 24 hr capsule take 1 capsule by mouth at bedtime 30 capsule 5 Past Week at Unknown time  . vitamin C (ASCORBIC ACID) 500 MG tablet Take 500 mg daily by mouth.    Past Week at Unknown time  . vitamin E (VITAMIN E) 1000 UNIT capsule Take 1,000 Units daily by mouth.    Past Week at Unknown time    Assessment: [age] YOF continuing on IV heparin for ACS. Hg down to 10.1, Plt wnl, trop elevated. Pt is not on any anticoagulation at home. Initial heparin level subtherapeutic at 0.1. No bleeding or IV line issues per RN.  Goal of Therapy:  Heparin level 0.3-0.7 units/ml Monitor platelets by anticoagulation protocol: Yes   Plan:  -Heparin 1500 units bolus x 1 -Increase heparin infusion to 850 units/hr -F/u 8 hr heparin level -Monitor daily heparin level, CBC and s/s of bleeding  Babs BertinHaley Kyah Buesing, PharmD, BCPS Clinical Pharmacist Clinical phone for 09/30/2017 until 3:30pm: [phone] If after 3:30pm, please call main pharmacy at: [phone] 09/30/2017 8:51 AM

## 2017-10-01 DIAGNOSIS — E0500 Thyrotoxicosis with diffuse goiter without thyrotoxic crisis or storm: Secondary | ICD-10-CM

## 2017-10-01 LAB — CBC
HCT: 27.8 % — ABNORMAL LOW (ref 36.0–46.0)
Hemoglobin: 9.2 g/dL — ABNORMAL LOW (ref 12.0–15.0)
MCH: 30.2 pg (ref 26.0–34.0)
MCHC: 33.1 g/dL (ref 30.0–36.0)
MCV: 91.1 fL (ref 78.0–100.0)
PLATELETS: 192 10*3/uL (ref 150–400)
RBC: 3.05 MIL/uL — AB (ref 3.87–5.11)
RDW: 15.1 % (ref 11.5–15.5)
WBC: 5.5 10*3/uL (ref 4.0–10.5)

## 2017-10-01 LAB — T3, FREE: T3, Free: 12.6 pg/mL — ABNORMAL HIGH (ref 2.0–4.4)

## 2017-10-01 LAB — HEPARIN LEVEL (UNFRACTIONATED): Heparin Unfractionated: 0.2 IU/mL — ABNORMAL LOW (ref 0.30–0.70)

## 2017-10-01 MED ORDER — LOPERAMIDE HCL 2 MG PO CAPS
2.0000 mg | ORAL_CAPSULE | ORAL | Status: DC | PRN
  Administered 2017-10-01 – 2017-10-04 (×5): 2 mg via ORAL
  Filled 2017-10-01 (×5): qty 1

## 2017-10-01 NOTE — Progress Notes (Signed)
PROGRESS NOTE    Katherine ShamsCary Reynolds  ZOX:[id number] DOB: 02/07/1946 DOA: 09/29/2017 PCP: [name], Stephen O, MD    Brief Narrative:  Katherine ShamsCary Calais is a 71 y.o. female with medical history significant for coronary artery disease, GERD, hypertension, and hyperthyroidism followed by endocrinology, now presenting to the emergency department for evaluation of confusion and lightheadedness for the past several days.  Patient had been seen by her endocrinologist 1 week ago with worsening hyperthyroidism and had her Tapazole increased.  Due to some confusion, patient actually stopped her Tapazole at that time and has not taken any for the past week.  Over this interval, she has become increasingly confused and "dizzy," described as a lightheadedness. She also reports palpitations, but denies chest pain. Denies dyspnea.    Assessment & Plan:   Principal Problem:   Thyrotoxicosis Active Problems:   HTN (hypertension)   GERD (gastroesophageal reflux disease)   CAD (coronary artery disease)   Hypokalemia   Hyperthyroidism   Elevated lactic acid level   Elevated troponin   SIRS (systemic inflammatory response syndrome) (HCC)   Leukopenia   1-Thyrotoxicosis; presents with confusion, tremors, palpitation.  Continue with propanolol, methimazole.  Continue with  IV hydrocortisone.  TSH 0.010, free T 4; 4. Free T 3 at 12/ Continue with current treatment  2-N-STEMI;  Related to thyrotoxicosis.  Elevated troponin; demand ischemia.  Continue with Imdur, aspirin, statins.  EKG with pronounce T wave inversion V 2-3 Continue with Heparin gtt.  Cardiology following.  ECHO pending.   3-SIRS; suspect related to number one.  Lactic acid normalized.  Blood culture and urine culture. No growth, stop IV antibiotics.    Syncope; in setting of thyrotoxicosis, N-stemi ECHO ordered.   HTN; on propanolol. Continue with lisinopril.   GERD; on protonix.   Hypokalemia;  Resolved.    DVT prophylaxis:  heparin gtt.  Code Status: full code.  Family Communication: care discussed with patient.  Disposition Plan: remain in patient.    Consultants:   Cardiology   Procedures: ECHO Antimicrobials:  Vancomycin. Zosyn.   Subjective: Oriented to place, time and situation.  She understand risk of not taking her medications.  Report diarrhea.   Objective: Vitals:   10/01/17 0032 10/01/17 0309 10/01/17 0600 10/01/17 0802  BP: 121/71 131/69  (!) 144/95  Pulse: 78 76  93  Resp:  (!) 25  (!) 26  Temp: 98.5 F (36.9 C) 98.8 F (37.1 C)  98.3 F (36.8 C)  TempSrc: Oral Oral  Oral  SpO2:  97%  97%  Weight:   56.6 kg (124 lb 12.5 oz)   Height:       No intake or output data in the 24 hours ending 10/01/17 0857 Filed Weights   09/29/17 1547 09/30/17 0119 10/01/17 0600  Weight: 55.8 kg (123 lb) 55.5 kg (122 lb 6.4 oz) 56.6 kg (124 lb 12.5 oz)    Examination:  General exam: Appears comfortable , with tremors.  Respiratory system: CTA Cardiovascular system: S 1, S 2 RRR Gastrointestinal system: BS present, soft, nt Central nervous system: non focal, tremors  Extremities: Symmetric power.  Skin: No rash    Data Reviewed: I have personally reviewed following labs and imaging studies  CBC: Recent Labs  Lab 09/29/17 1552 09/30/17 0700 10/01/17 0250  WBC 3.2* 4.7 5.5  HGB 11.6* 10.1* 9.2*  HCT 34.3* 31.0* 27.8*  MCV 92.2 92.0 91.1  PLT 259 213 192   Basic Metabolic Panel: Recent Labs  Lab 09/29/17 1552 09/30/17 0700  NA 138 139  K 3.1* 3.8  CL 104 109  CO2 23 23  GLUCOSE 175* 102*  BUN 14 9  CREATININE 0.75 0.59  CALCIUM 10.2 9.6   GFR: Estimated Creatinine Clearance: 58.5 mL/min (by C-G formula based on SCr of 0.59 mg/dL). Liver Function Tests: Recent Labs  Lab 09/29/17 1736  AST 39  ALT 55*  ALKPHOS 89  BILITOT 1.2  PROT 6.7  ALBUMIN 3.1*   No results for input(s): LIPASE, AMYLASE in the last 168 hours. No results for input(s): AMMONIA in the last  168 hours. Coagulation Profile: Recent Labs  Lab 09/29/17 2014  INR 1.38   Cardiac Enzymes: Recent Labs  Lab 09/29/17 2014 09/30/17 0323 09/30/17 0700  TROPONINI 0.47* 0.51* 0.37*   BNP (last 3 results) Recent Labs    05/10/17 1324  PROBNP 289.0*   HbA1C: No results for input(s): HGBA1C in the last 72 hours. CBG: Recent Labs  Lab 09/29/17 1556  GLUCAP 175*   Lipid Profile: No results for input(s): CHOL, HDL, LDLCALC, TRIG, CHOLHDL, LDLDIRECT in the last 72 hours. Thyroid Function Tests: Recent Labs    09/29/17 2014  TSH <0.010*  FREET4 4.04*  T3FREE 12.6*   Anemia Panel: No results for input(s): VITAMINB12, FOLATE, FERRITIN, TIBC, IRON, RETICCTPCT in the last 72 hours. Sepsis Labs: Recent Labs  Lab 09/29/17 1604 09/29/17 1823 09/29/17 2014  PROCALCITON  --   --  <0.10  LATICACIDVEN 4.19* 2.63* 1.5    Recent Results (from the past 240 hour(s))  Urine culture     Status: None   Collection Time: 09/29/17  5:45 PM  Result Value Ref Range Status   Specimen Description URINE, RANDOM  Final   Special Requests NONE  Final   Culture NO GROWTH  Final   Report Status 09/30/2017 FINAL  Final  Culture, blood (routine x 2)     Status: None (Preliminary result)   Collection Time: 09/29/17  6:00 PM  Result Value Ref Range Status   Specimen Description BLOOD BLOOD RIGHT FOREARM  Final   Special Requests   Final    BOTTLES DRAWN AEROBIC AND ANAEROBIC Blood Culture adequate volume   Culture NO GROWTH < 24 HOURS  Final   Report Status PENDING  Incomplete  Culture, blood (routine x 2)     Status: None (Preliminary result)   Collection Time: 09/29/17  6:40 PM  Result Value Ref Range Status   Specimen Description BLOOD ARM LEFT  Final   Special Requests   Final    BOTTLES DRAWN AEROBIC AND ANAEROBIC Blood Culture adequate volume   Culture NO GROWTH < 24 HOURS  Final   Report Status PENDING  Incomplete         Radiology Studies: Dg Chest 2 View  Result  Date: 09/29/2017 CLINICAL DATA:  Syncope and chest pain EXAM: CHEST  2 VIEW COMPARISON:  Chest radiograph May 22, 2017 and chest CT May 22, 2017 FINDINGS: There is no edema or consolidation. Heart is borderline enlarged with pulmonary vascularity within normal limits. No adenopathy. There is aortic atherosclerosis. No evident bone lesions. IMPRESSION: Borderline cardiac enlargement. No edema or consolidation. There is aortic atherosclerosis. Aortic Atherosclerosis (ICD10-I70.0). Electronically Signed   By: Bretta BangWilliam  Woodruff III M.D.   On: 09/29/2017 18:28   Ct Head Wo Contrast  Result Date: 09/29/2017 CLINICAL DATA:  Altered mental status with intermittent confusion. History of seizures EXAM: CT HEAD WITHOUT CONTRAST TECHNIQUE: Contiguous axial images were obtained from the base of the  skull through the vertex without intravenous contrast. COMPARISON:  May 21, 2017 an September 10, 2011 FINDINGS: Brain: The ventricles are normal in size and configuration. There is no evident intracranial mass, hemorrhage, extra-axial fluid collection, or midline shift. There is slight small vessel disease in the anterior centra semiovale bilaterally. Elsewhere gray-white compartments appear normal. There is no evident acute infarct. Vascular: There is no hyperdense vessel. There is mild calcification in each carotid siphon. 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 No other bone lesions evident. Sinuses/Orbits: There is mild mucosal thickening in several ethmoid air cells. Other visualized paranasal sinuses are clear. Orbits appear symmetric bilaterally. Other: Mastoid air cells are clear. IMPRESSION: Slight periventricular small vessel disease. Gray-white compartments otherwise appear normal. No mass or hemorrhage. Stable posterior left frontal osteoma. Bony calvarium otherwise unremarkable and stable. Areas of arterial vascular calcification noted. Mild mucosal thickening noted in several ethmoid air cells.  Electronically Signed   By: Bretta Bang III M.D.   On: 09/29/2017 18:52        Scheduled Meds: . amLODipine  10 mg Oral Daily  . aspirin EC  81 mg Oral Daily  . atorvastatin  20 mg Oral q1800  . cholecalciferol  1,000 Units Oral Daily  . ezetimibe  10 mg Oral Daily  . feeding supplement (ENSURE ENLIVE)  237 mL Oral TID BM  . hydrocortisone sod succinate (SOLU-CORTEF) inj  100 mg Intravenous Q8H  . isosorbide mononitrate  120 mg Oral Daily  . lisinopril  20 mg Oral Daily  . methimazole  20 mg Oral Q6H  . oxybutynin  10 mg Oral Daily  . pantoprazole  40 mg Oral Daily  . potassium chloride SA  20 mEq Oral TID  . propranolol  60 mg Oral Q6H  . venlafaxine XR  150 mg Oral QHS   Continuous Infusions: . heparin 1,150 Units/hr (10/01/17 0350)  . piperacillin-tazobactam (ZOSYN)  IV Stopped (10/01/17 1610)  . vancomycin Stopped (09/30/17 2247)     LOS: 2 days    Time spent: 35 minutes.     Alba Cory, MD [facility] Pager [phone]  If 7PM-7AM, please contact night-coverage [email or web address] Password [email or web address] 10/01/2017, 8:57 AM

## 2017-10-01 NOTE — Progress Notes (Addendum)
Progress Note  Patient Name: [name] Date of Encounter: 10/01/2017  Primary Cardiologist: No primary care provider on file.  Dr. Katrinka Blazing  Subjective   No significant chest discomfort, no shortness of breath.  Clearly she is fidgety, likely secondary from her thyrotoxicosis.  Inpatient Medications    Scheduled Meds: . amLODipine  10 mg Oral Daily  . aspirin EC  81 mg Oral Daily  . atorvastatin  20 mg Oral q1800  . cholecalciferol  1,000 Units Oral Daily  . ezetimibe  10 mg Oral Daily  . feeding supplement (ENSURE ENLIVE)  237 mL Oral TID BM  . hydrocortisone sod succinate (SOLU-CORTEF) inj  100 mg Intravenous Q8H  . isosorbide mononitrate  120 mg Oral Daily  . lisinopril  20 mg Oral Daily  . methimazole  20 mg Oral Q6H  . oxybutynin  10 mg Oral Daily  . pantoprazole  40 mg Oral Daily  . potassium chloride SA  20 mEq Oral TID  . propranolol  60 mg Oral Q6H  . venlafaxine XR  150 mg Oral QHS   Continuous Infusions: . heparin 1,150 Units/hr (10/01/17 0350)   PRN Meds: acetaminophen, ALPRAZolam, bismuth subsalicylate, nitroGLYCERIN, ondansetron (ZOFRAN) IV, traMADol   Vital Signs    Vitals:   10/01/17 0032 10/01/17 0309 10/01/17 0600 10/01/17 0802  BP: 121/71 131/69  (!) 144/95  Pulse: 78 76  93  Resp:  (!) 25  (!) 26  Temp: 98.5 F (36.9 C) 98.8 F (37.1 C)  98.3 F (36.8 C)  TempSrc: Oral Oral  Oral  SpO2:  97%  97%  Weight:   124 lb 12.5 oz (56.6 kg)   Height:       No intake or output data in the 24 hours ending 10/01/17 0942 Filed Weights   09/29/17 1547 09/30/17 0119 10/01/17 0600  Weight: 123 lb (55.8 kg) 122 lb 6.4 oz (55.5 kg) 124 lb 12.5 oz (56.6 kg)    Telemetry    Occasional PVCs, normal sinus rhythm- Personally Reviewed  ECG    Sinus rhythm T wave inversions noted in the anterior precordial leads- Personally Reviewed  Physical Exam   GEN: No acute distress.  Thin Neck: No JVD Cardiac: RRR, 2/6 SM,no rubs, or gallops.  Respiratory:  Clear to auscultation bilaterally. GI: Soft, nontender, non-distended  MS: No edema; No deformity. Neuro:  Nonfocal  Psych: Normal affect, somewhat pressured  Labs    Chemistry Recent Labs  Lab 09/29/17 1552 09/29/17 1736 09/30/17 0700  NA 138  --  139  K 3.1*  --  3.8  CL 104  --  109  CO2 23  --  23  GLUCOSE 175*  --  102*  BUN 14  --  9  CREATININE 0.75  --  0.59  CALCIUM 10.2  --  9.6  PROT  --  6.7  --   ALBUMIN  --  3.1*  --   AST  --  39  --   ALT  --  55*  --   ALKPHOS  --  89  --   BILITOT  --  1.2  --   GFRNONAA >60  --  >60  GFRAA >60  --  >60  ANIONGAP 11  --  7     Hematology Recent Labs  Lab 09/29/17 1552 09/30/17 0700 10/01/17 0250  WBC 3.2* 4.7 5.5  RBC 3.72* 3.37* 3.05*  HGB 11.6* 10.1* 9.2*  HCT 34.3* 31.0* 27.8*  MCV 92.2 92.0 91.1  MCH 31.2 30.0 30.2  MCHC 33.8 32.6 33.1  RDW 15.4 15.3 15.1  PLT 259 213 192    Cardiac Enzymes Recent Labs  Lab 09/29/17 2014 09/30/17 0323 09/30/17 0700  TROPONINI 0.47* 0.51* 0.37*    Recent Labs  Lab 09/29/17 1820  TROPIPOC 0.39*     BNPNo results for input(s): BNP, PROBNP in the last 168 hours.   DDimer No results for input(s): DDIMER in the last 168 hours.   Radiology    Dg Chest 2 View  Result Date: 09/29/2017 CLINICAL DATA:  Syncope and chest pain EXAM: CHEST  2 VIEW COMPARISON:  Chest radiograph May 22, 2017 and chest CT May 22, 2017 FINDINGS: There is no edema or consolidation. Heart is borderline enlarged with pulmonary vascularity within normal limits. No adenopathy. There is aortic atherosclerosis. No evident bone lesions. IMPRESSION: Borderline cardiac enlargement. No edema or consolidation. There is aortic atherosclerosis. Aortic Atherosclerosis (ICD10-I70.0). Electronically Signed   By: Bretta BangWilliam  Woodruff III M.D.   On: 09/29/2017 18:28   Ct Head Wo Contrast  Result Date: 09/29/2017 CLINICAL DATA:  Altered mental status with intermittent confusion. History of seizures EXAM: CT  HEAD WITHOUT CONTRAST TECHNIQUE: Contiguous axial images were obtained from the base of the skull through the vertex without intravenous contrast. COMPARISON:  May 21, 2017 an September 10, 2011 FINDINGS: Brain: The ventricles are normal in size and configuration. There is no evident intracranial mass, hemorrhage, extra-axial fluid collection, or midline shift. There is slight small vessel disease in the anterior centra semiovale bilaterally. Elsewhere gray-white compartments appear normal. There is no evident acute infarct. Vascular: There is no hyperdense vessel. There is mild calcification in each carotid siphon. 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 No other bone lesions evident. Sinuses/Orbits: There is mild mucosal thickening in several ethmoid air cells. Other visualized paranasal sinuses are clear. Orbits appear symmetric bilaterally. Other: Mastoid air cells are clear. IMPRESSION: Slight periventricular small vessel disease. Gray-white compartments otherwise appear normal. No mass or hemorrhage. Stable posterior left frontal osteoma. Bony calvarium otherwise unremarkable and stable. Areas of arterial vascular calcification noted. Mild mucosal thickening noted in several ethmoid air cells. Electronically Signed   By: Bretta BangWilliam  Woodruff III M.D.   On: 09/29/2017 18:52    Cardiac Studies   Echocardiogram 09/30/17: - Left ventricle: The cavity size was normal. Systolic function was   normal. The estimated ejection fraction was in the range of 50%   to 55%. Hypokinesis of the basal-midinferior myocardium;   consistent with infarction in the distribution of the right   coronary artery. Features are consistent with a pseudonormal left   ventricular filling pattern, with concomitant abnormal relaxation   and increased filling pressure (grade 2 diastolic dysfunction). - Mitral valve: Malcoaptation due to mildly restricted motion of   the posterior leaflet, consistent with ischemic  tethering. There   was mild to moderate regurgitation directed eccentrically and   posteriorly. - Left atrium: The atrium was moderately dilated.  Patient Profile     71 y.o. female with demand ischemia, elevated troponin in the setting of thyrotoxicosis with history of coronary vasospasm, with abnormal EKG likely metabolic related with recent heart catheterization 3 months ago showing minimal CAD.  Assessment & Plan    Thyrotoxicosis -Maintaining sinus rhythm.  Propranolol. -On Tapazole.  She had stopped taking this as an outpatient.  Demand ischemia/elevated troponin -Likely secondary to her thyrotoxicosis.  Metabolic derangement.  Recent heart catheterization showed minimal CAD.  She has had  a presumed history of coronary vasospasm and is currently on isosorbide 120 mg once a day, amlodipine 10 mg once a day and propranolol.  We will continue with this.  No new heart catheterization.  Her troponin is trending downward.  Low level.  EF reassuring.  Mid inferior wall abnormality was seen on prior echocardiogram.  We will discontinue her heparin since she has had treatment for over 24 hours.  Echocardiogram reassuring.  No further cardiology recommendations.  We will sign off.  Please call with any questions  For questions or updates, please contact [facility] Please consult [email or web address] for contact info under Cardiology/STEMI.      Signed, Donato SchultzMark Skains, MD  10/01/2017, 9:42 AM

## 2017-10-01 NOTE — Care Management Note (Signed)
Case Management Note  Patient Details  Name: [name] MRN: [id number] Date of Birth: 01/06/1946  Subjective/Objective:    Pt admitted with Thyrotoxicos syndrome             Action/Plan:  PTA independent from home.   CM will continue to follow for discharge needs   Expected Discharge Date:  10/02/17               Expected Discharge Plan:  Home/Self Care  In-House Referral:     Discharge planning Services  CM Consult  Post Acute Care Choice:    Choice offered to:     DME Arranged:    DME Agency:     HH Arranged:    HH Agency:     Status of Service:     If discussed at [employer] Meetings, dates discussed:    Additional Comments:  Cherylann ParrClaxton, Sheena Simonis S, RN 10/01/2017, 2:39 PM

## 2017-10-01 NOTE — Progress Notes (Signed)
Dr. Sunnie Nielsenegalado paged d/t c/o diarrhea per patient - requesting Immodium.  Updated on patient condition.  Patient states she had diarrhea before admission and it has not stopped.  Patient has c/o NO chest pain to this nurse, even when prompted.  No c/o chest pain noted in nursing progress notes overnight.  Patient evaluated by Cardiology this AM and also denotes c/o chest pain.  Heparin gtt discontinued.  Dr. Sunnie Nielsenegalado states patient may transfer to a tele bed as long as without chest pain.

## 2017-10-01 NOTE — Progress Notes (Signed)
ANTICOAGULATION CONSULT NOTE  Pharmacy Consult for Heparin Indication: chest pain/ACS  Allergies  Allergen Reactions  . Sulfa Drugs Cross Reactors Shortness Of Breath and Palpitations    Patient Measurements: Height: 5\' 6"  (167.6 cm) Weight: 122 lb 6.4 oz (55.5 kg) IBW/kg (Calculated) : 59.3  Vital Signs: Temp: 98.8 F (37.1 C) (12/07 0309) Temp Source: Oral (12/07 0309) BP: 131/69 (12/07 0309) Pulse Rate: 76 (12/07 0309)  Labs: Recent Labs    09/29/17 1552 09/29/17 2014 09/30/17 0323 09/30/17 0700 09/30/17 1643 10/01/17 0250  HGB 11.6*  --   --  10.1*  --  9.2*  HCT 34.3*  --   --  31.0*  --  27.8*  PLT 259  --   --  213  --  192  APTT  --  25  --   --   --   --   LABPROT  --  16.8*  --   --   --   --   INR  --  1.38  --   --   --   --   HEPARINUNFRC  --   --   --  0.10* 0.17* 0.20*  CREATININE 0.75  --   --  0.59  --   --   TROPONINI  --  0.47* 0.51* 0.37*  --   --     Estimated Creatinine Clearance: 57.3 mL/min (by C-G formula based on SCr of 0.59 mg/dL).   Medical History: Past Medical History:  Diagnosis Date  . Arthritis   . Chronic lower back pain   . Cluster headache   . Coronary artery disease    a. 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 non obs dz LHC 2007. c. [facility] d. cath 05/26/17 mild non obstructive CAD  . Diverticulitis    a. Hx microperf 2012 - hospitalizated.  Marland Kitchen. GERD (gastroesophageal reflux disease)   . Hypercholesteremia   . Hypertension   . PVC's (premature ventricular contractions)    a. Hx of trigeminy/bigeminy.  . Seizures (HCC)   . Thyroid disease    hyperthyroid  . UTI (lower urinary tract infection)     Assessment: 71 YOF continuing on IV heparin for ACS. Hg down to 9.2, Plt wnl, trop elevated. Pt was not on any anticoagulation at home.  Follow up heparin level still below goal at 0.20. No issues per RN.   Goal of Therapy:  Heparin level 0.3-0.7 units/ml Monitor platelets by anticoagulation protocol:  Yes   Plan:  -Inc heparin to 1150 units/hr -1200 HL  Abran DukeJames Leenah Seidner, PharmD, BCPS Clinical Pharmacist Phone: [phone]

## 2017-10-02 LAB — CBC
HEMATOCRIT: 28.6 % — AB (ref 36.0–46.0)
Hemoglobin: 9.3 g/dL — ABNORMAL LOW (ref 12.0–15.0)
MCH: 29.9 pg (ref 26.0–34.0)
MCHC: 32.5 g/dL (ref 30.0–36.0)
MCV: 92 fL (ref 78.0–100.0)
PLATELETS: 207 10*3/uL (ref 150–400)
RBC: 3.11 MIL/uL — AB (ref 3.87–5.11)
RDW: 15.1 % (ref 11.5–15.5)
WBC: 7.7 10*3/uL (ref 4.0–10.5)

## 2017-10-02 LAB — BASIC METABOLIC PANEL
ANION GAP: 5 (ref 5–15)
BUN: 26 mg/dL — ABNORMAL HIGH (ref 6–20)
CO2: 25 mmol/L (ref 22–32)
Calcium: 9.6 mg/dL (ref 8.9–10.3)
Chloride: 110 mmol/L (ref 101–111)
Creatinine, Ser: 0.68 mg/dL (ref 0.44–1.00)
GLUCOSE: 150 mg/dL — AB (ref 65–99)
POTASSIUM: 4.5 mmol/L (ref 3.5–5.1)
Sodium: 140 mmol/L (ref 135–145)

## 2017-10-02 LAB — MRSA PCR SCREENING: MRSA by PCR: NEGATIVE

## 2017-10-02 MED ORDER — HYDROCORTISONE NA SUCCINATE PF 100 MG IJ SOLR
100.0000 mg | Freq: Two times a day (BID) | INTRAMUSCULAR | Status: DC
  Administered 2017-10-02: 100 mg via INTRAVENOUS
  Filled 2017-10-02: qty 2

## 2017-10-02 MED ORDER — ENOXAPARIN SODIUM 40 MG/0.4ML ~~LOC~~ SOLN
40.0000 mg | SUBCUTANEOUS | Status: DC
  Administered 2017-10-02 – 2017-10-03 (×2): 40 mg via SUBCUTANEOUS
  Filled 2017-10-02 (×2): qty 0.4

## 2017-10-02 NOTE — Progress Notes (Signed)
PROGRESS NOTE    Katherine ShamsCary Mccandlish  ZHY:[id number] DOB: 11/19/1945 DOA: 09/29/2017 PCP: [name], Stephen O, MD    Brief Narrative:  [name] is a 71 y.o. female with medical history significant for coronary artery disease, GERD, hypertension, and hyperthyroidism followed by endocrinology, now presenting to the emergency department for evaluation of confusion and lightheadedness for the past several days.  Patient had been seen by her endocrinologist 1 week ago with worsening hyperthyroidism and had her Tapazole increased.  Due to some confusion, patient actually stopped her Tapazole at that time and has not taken any for the past week.  Over this interval, she has become increasingly confused and "dizzy," described as a lightheadedness. She also reports palpitations, but denies chest pain. Denies dyspnea.    Assessment & Plan:   Principal Problem:   Thyrotoxicosis Active Problems:   HTN (hypertension)   GERD (gastroesophageal reflux disease)   CAD (coronary artery disease)   Hypokalemia   Hyperthyroidism   Elevated lactic acid level   Elevated troponin   SIRS (systemic inflammatory response syndrome) (HCC)   Leukopenia   1-Thyrotoxicosis; presents with confusion, tremors, palpitation.  Continue with propanolol, methimazole.  Continue with  IV hydrocortisone.  TSH 0.010, free T 4; 4. Free T 3 at 12/ Continue with current treatment. Taper hydrocortisone.   2-Positive troponin, Demand ischemia;  Related to thyrotoxicosis.  Elevated troponin; demand ischemia.  Continue with Imdur, aspirin, statins.  EKG with pronounce T wave inversion V 2-3 Received initially  Heparin gtt.  Cardiology following.  ECHO Ef 55 %, hypokinesis , mid inferior myocardium.   3-SIRS; suspect related to number one.  Lactic acid normalized.  Blood culture and urine culture. No growth, stop IV antibiotics.    Syncope; in setting of thyrotoxicosis, N-stemi ECHO Ef 55 %  HTN; on propanolol. Continue with  lisinopril.   GERD; on protonix.   Hypokalemia;  Resolved.    DVT prophylaxis: lovenox Code Status: full code.  Family Communication: care discussed with patient.  Disposition Plan: remain in patient.    Consultants:   Cardiology   Procedures: ECHO Antimicrobials:  Vancomycin. Zosyn.   Subjective: Last episode of chest pain yesterday. She is feeling better.  Still with tremors.  Diarrhea improved.   Objective: Vitals:   10/01/17 2148 10/01/17 2226 10/02/17 0427 10/02/17 1220  BP: (!) 154/80 (!) 142/88 130/67 140/82  Pulse: 81 82 64 81  Resp:  (!) 27 16 (!) 21  Temp:  98.7 F (37.1 C) 98 F (36.7 C) 98 F (36.7 C)  TempSrc:  Oral Oral Oral  SpO2:  100% 96%   Weight:   56.6 kg (124 lb 12.5 oz)   Height:       No intake or output data in the 24 hours ending 10/02/17 1410 Filed Weights   09/30/17 0119 10/01/17 0600 10/02/17 0427  Weight: 55.5 kg (122 lb 6.4 oz) 56.6 kg (124 lb 12.5 oz) 56.6 kg (124 lb 12.5 oz)    Examination:  General exam: NAD Respiratory system: CTA Cardiovascular system: S 1, S 2 RRR Gastrointestinal system: BS present, soft, nt Central nervous system: non focal ,tremors.   Extremities: Symmetric power.  Skin: No rash    Data Reviewed: I have personally reviewed following labs and imaging studies  CBC: Recent Labs  Lab 09/29/17 1552 09/30/17 0700 10/01/17 0250 10/02/17 0226  WBC 3.2* 4.7 5.5 7.7  HGB 11.6* 10.1* 9.2* 9.3*  HCT 34.3* 31.0* 27.8* 28.6*  MCV 92.2 92.0 91.1 92.0  PLT 259 213 192 207   Basic Metabolic Panel: Recent Labs  Lab 09/29/17 1552 09/30/17 0700 10/02/17 0226  NA 138 139 140  K 3.1* 3.8 4.5  CL 104 109 110  CO2 23 23 25   GLUCOSE 175* 102* 150*  BUN 14 9 26*  CREATININE 0.75 0.59 0.68  CALCIUM 10.2 9.6 9.6   GFR: Estimated Creatinine Clearance: 58.5 mL/min (by C-G formula based on SCr of 0.68 mg/dL). Liver Function Tests: Recent Labs  Lab 09/29/17 1736  AST 39  ALT 55*  ALKPHOS 89    BILITOT 1.2  PROT 6.7  ALBUMIN 3.1*   No results for input(s): LIPASE, AMYLASE in the last 168 hours. No results for input(s): AMMONIA in the last 168 hours. Coagulation Profile: Recent Labs  Lab 09/29/17 2014  INR 1.38   Cardiac Enzymes: Recent Labs  Lab 09/29/17 2014 09/30/17 0323 09/30/17 0700  TROPONINI 0.47* 0.51* 0.37*   BNP (last 3 results) Recent Labs    05/10/17 1324  PROBNP 289.0*   HbA1C: No results for input(s): HGBA1C in the last 72 hours. CBG: Recent Labs  Lab 09/29/17 1556  GLUCAP 175*   Lipid Profile: No results for input(s): CHOL, HDL, LDLCALC, TRIG, CHOLHDL, LDLDIRECT in the last 72 hours. Thyroid Function Tests: Recent Labs    09/29/17 2014  TSH <0.010*  FREET4 4.04*  T3FREE 12.6*   Anemia Panel: No results for input(s): VITAMINB12, FOLATE, FERRITIN, TIBC, IRON, RETICCTPCT in the last 72 hours. Sepsis Labs: Recent Labs  Lab 09/29/17 1604 09/29/17 1823 09/29/17 2014  PROCALCITON  --   --  <0.10  LATICACIDVEN 4.19* 2.63* 1.5    Recent Results (from the past 240 hour(s))  Urine culture     Status: None   Collection Time: 09/29/17  5:45 PM  Result Value Ref Range Status   Specimen Description URINE, RANDOM  Final   Special Requests NONE  Final   Culture NO GROWTH  Final   Report Status 09/30/2017 FINAL  Final  Culture, blood (routine x 2)     Status: None (Preliminary result)   Collection Time: 09/29/17  6:00 PM  Result Value Ref Range Status   Specimen Description BLOOD BLOOD RIGHT FOREARM  Final   Special Requests   Final    BOTTLES DRAWN AEROBIC AND ANAEROBIC Blood Culture adequate volume   Culture NO GROWTH 3 DAYS  Final   Report Status PENDING  Incomplete  Culture, blood (routine x 2)     Status: None (Preliminary result)   Collection Time: 09/29/17  6:40 PM  Result Value Ref Range Status   Specimen Description BLOOD ARM LEFT  Final   Special Requests   Final    BOTTLES DRAWN AEROBIC AND ANAEROBIC Blood Culture  adequate volume   Culture NO GROWTH 3 DAYS  Final   Report Status PENDING  Incomplete  MRSA PCR Screening     Status: None   Collection Time: 10/01/17 10:15 PM  Result Value Ref Range Status   MRSA by PCR NEGATIVE NEGATIVE Final    Comment:        The GeneXpert MRSA Assay (FDA approved for NASAL specimens only), is one component of a comprehensive MRSA colonization surveillance program. It is not intended to diagnose MRSA infection nor to guide or monitor treatment for MRSA infections.          Radiology Studies: No results found.      Scheduled Meds: . amLODipine  10 mg Oral Daily  . aspirin  EC  81 mg Oral Daily  . atorvastatin  20 mg Oral q1800  . cholecalciferol  1,000 Units Oral Daily  . ezetimibe  10 mg Oral Daily  . feeding supplement (ENSURE ENLIVE)  237 mL Oral TID BM  . hydrocortisone sod succinate (SOLU-CORTEF) inj  100 mg Intravenous Q8H  . isosorbide mononitrate  120 mg Oral Daily  . lisinopril  20 mg Oral Daily  . methimazole  20 mg Oral Q6H  . oxybutynin  10 mg Oral Daily  . pantoprazole  40 mg Oral Daily  . potassium chloride SA  20 mEq Oral TID  . propranolol  60 mg Oral Q6H  . venlafaxine XR  150 mg Oral QHS   Continuous Infusions:    LOS: 3 days    Time spent: 35 minutes.     Alba CoryBelkys A Zeynab Klett, MD [facility] Pager [phone]  If 7PM-7AM, please contact night-coverage [email or web address] Password [email or web address] 10/02/2017, 2:10 PM

## 2017-10-03 MED ORDER — HYDROCORTISONE NA SUCCINATE PF 100 MG IJ SOLR
100.0000 mg | Freq: Every day | INTRAMUSCULAR | Status: DC
  Administered 2017-10-03: 100 mg via INTRAVENOUS
  Filled 2017-10-03: qty 2

## 2017-10-03 NOTE — Progress Notes (Signed)
PROGRESS NOTE    Katherine ShamsCary Falzone  XBM:[id number] DOB: 02/13/1946 DOA: 09/29/2017 PCP: [name], Stephen O, MD    Brief Narrative:  [name] is a 71 y.o. female with medical history significant for coronary artery disease, GERD, hypertension, and hyperthyroidism followed by endocrinology, now presenting to the emergency department for evaluation of confusion and lightheadedness for the past several days.  Patient had been seen by her endocrinologist 1 week ago with worsening hyperthyroidism and had her Tapazole increased.  Due to some confusion, patient actually stopped her Tapazole at that time and has not taken any for the past week.  Over this interval, she has become increasingly confused and "dizzy," described as a lightheadedness. She also reports palpitations, but denies chest pain. Denies dyspnea.    Assessment & Plan:   Principal Problem:   Thyrotoxicosis Active Problems:   HTN (hypertension)   GERD (gastroesophageal reflux disease)   CAD (coronary artery disease)   Hypokalemia   Hyperthyroidism   Elevated lactic acid level   Elevated troponin   SIRS (systemic inflammatory response syndrome) (HCC)   Leukopenia   1-Thyrotoxicosis; presents with confusion, tremors, palpitation.  Continue with propanolol, methimazole.  Continue with  IV hydrocortisone.  TSH 0.010, free T 4; 4. Free T 3 at 12/ Continue with current treatment. Taper hydrocortisone. Change to daily   2-Positive troponin, Demand ischemia;  Related to thyrotoxicosis.  Elevated troponin; demand ischemia.  Continue with Imdur, aspirin, statins.  EKG with pronounce T wave inversion V 2-3 Received initially  Heparin gtt.  Cardiology sign off. ECHO Ef 55 %, hypokinesis , mid inferior myocardium. Improved.    3-SIRS; suspect related to number one.  Lactic acid normalized.  Blood culture and urine culture. No growth, stop IV antibiotics.    Syncope; in setting of thyrotoxicosis, N-stemi ECHO Ef 55 %  HTN; on  propanolol. Continue with lisinopril.   GERD; on protonix.   Hypokalemia;  Resolved.    DVT prophylaxis: lovenox Code Status: full code.  Family Communication: care discussed with patient.  Disposition Plan: home tomorrow. PT , ot    Consultants:   Cardiology   Procedures: ECHO Antimicrobials:  Vancomycin. Zosyn.   Subjective: She is feeling ok, tremors improved.  Only had mild discomfort in her chest yesterday  Objective: Vitals:   10/02/17 1220 10/02/17 2008 10/03/17 0347 10/03/17 0433  BP: 140/82 (!) 116/54  122/72  Pulse: 81  78 69  Resp: (!) 21 (!) 25    Temp: 98 F (36.7 C) (!) 97.5 F (36.4 C) 98.1 F (36.7 C)   TempSrc: Oral Oral Oral   SpO2:   100%   Weight:   59.2 kg (130 lb 8 oz)   Height:        Intake/Output Summary (Last 24 hours) at 10/03/2017 0818 Last data filed at 10/03/2017 0504 Gross per 24 hour  Intake 960 ml  Output -  Net 960 ml   Filed Weights   10/01/17 0600 10/02/17 0427 10/03/17 0347  Weight: 56.6 kg (124 lb 12.5 oz) 56.6 kg (124 lb 12.5 oz) 59.2 kg (130 lb 8 oz)    Examination:  General exam: NAD Respiratory system: CTA Cardiovascular system: S 1, S 2 RRR Gastrointestinal system: BS present, soft, nt Central nervous system: non focal. Less tremor.  Extremities: symmetric power Skin: No rash    Data Reviewed: I have personally reviewed following labs and imaging studies  CBC: Recent Labs  Lab 09/29/17 1552 09/30/17 0700 10/01/17 0250 10/02/17 0226  WBC  3.2* 4.7 5.5 7.7  HGB 11.6* 10.1* 9.2* 9.3*  HCT 34.3* 31.0* 27.8* 28.6*  MCV 92.2 92.0 91.1 92.0  PLT 259 213 192 207   Basic Metabolic Panel: Recent Labs  Lab 09/29/17 1552 09/30/17 0700 10/02/17 0226  NA 138 139 140  K 3.1* 3.8 4.5  CL 104 109 110  CO2 23 23 25   GLUCOSE 175* 102* 150*  BUN 14 9 26*  CREATININE 0.75 0.59 0.68  CALCIUM 10.2 9.6 9.6   GFR: Estimated Creatinine Clearance: 61.2 mL/min (by C-G formula based on SCr of 0.68  mg/dL). Liver Function Tests: Recent Labs  Lab 09/29/17 1736  AST 39  ALT 55*  ALKPHOS 89  BILITOT 1.2  PROT 6.7  ALBUMIN 3.1*   No results for input(s): LIPASE, AMYLASE in the last 168 hours. No results for input(s): AMMONIA in the last 168 hours. Coagulation Profile: Recent Labs  Lab 09/29/17 2014  INR 1.38   Cardiac Enzymes: Recent Labs  Lab 09/29/17 2014 09/30/17 0323 09/30/17 0700  TROPONINI 0.47* 0.51* 0.37*   BNP (last 3 results) Recent Labs    05/10/17 1324  PROBNP 289.0*   HbA1C: No results for input(s): HGBA1C in the last 72 hours. CBG: Recent Labs  Lab 09/29/17 1556  GLUCAP 175*   Lipid Profile: No results for input(s): CHOL, HDL, LDLCALC, TRIG, CHOLHDL, LDLDIRECT in the last 72 hours. Thyroid Function Tests: No results for input(s): TSH, T4TOTAL, FREET4, T3FREE, THYROIDAB in the last 72 hours. Anemia Panel: No results for input(s): VITAMINB12, FOLATE, FERRITIN, TIBC, IRON, RETICCTPCT in the last 72 hours. Sepsis Labs: Recent Labs  Lab 09/29/17 1604 09/29/17 1823 09/29/17 2014  PROCALCITON  --   --  <0.10  LATICACIDVEN 4.19* 2.63* 1.5    Recent Results (from the past 240 hour(s))  Urine culture     Status: None   Collection Time: 09/29/17  5:45 PM  Result Value Ref Range Status   Specimen Description URINE, RANDOM  Final   Special Requests NONE  Final   Culture NO GROWTH  Final   Report Status 09/30/2017 FINAL  Final  Culture, blood (routine x 2)     Status: None (Preliminary result)   Collection Time: 09/29/17  6:00 PM  Result Value Ref Range Status   Specimen Description BLOOD BLOOD RIGHT FOREARM  Final   Special Requests   Final    BOTTLES DRAWN AEROBIC AND ANAEROBIC Blood Culture adequate volume   Culture NO GROWTH 3 DAYS  Final   Report Status PENDING  Incomplete  Culture, blood (routine x 2)     Status: None (Preliminary result)   Collection Time: 09/29/17  6:40 PM  Result Value Ref Range Status   Specimen Description BLOOD  ARM LEFT  Final   Special Requests   Final    BOTTLES DRAWN AEROBIC AND ANAEROBIC Blood Culture adequate volume   Culture NO GROWTH 3 DAYS  Final   Report Status PENDING  Incomplete  MRSA PCR Screening     Status: None   Collection Time: 10/01/17 10:15 PM  Result Value Ref Range Status   MRSA by PCR NEGATIVE NEGATIVE Final    Comment:        The GeneXpert MRSA Assay (FDA approved for NASAL specimens only), is one component of a comprehensive MRSA colonization surveillance program. It is not intended to diagnose MRSA infection nor to guide or monitor treatment for MRSA infections.          Radiology Studies: No results  found.      Scheduled Meds: . amLODipine  10 mg Oral Daily  . aspirin EC  81 mg Oral Daily  . atorvastatin  20 mg Oral q1800  . cholecalciferol  1,000 Units Oral Daily  . enoxaparin (LOVENOX) injection  40 mg Subcutaneous Q24H  . ezetimibe  10 mg Oral Daily  . feeding supplement (ENSURE ENLIVE)  237 mL Oral TID BM  . hydrocortisone sod succinate (SOLU-CORTEF) inj  100 mg Intravenous Q12H  . isosorbide mononitrate  120 mg Oral Daily  . lisinopril  20 mg Oral Daily  . methimazole  20 mg Oral Q6H  . oxybutynin  10 mg Oral Daily  . pantoprazole  40 mg Oral Daily  . potassium chloride SA  20 mEq Oral TID  . propranolol  60 mg Oral Q6H  . venlafaxine XR  150 mg Oral QHS   Continuous Infusions:    LOS: 4 days    Time spent: 35 minutes.     Alba Cory, MD [facility] Pager [phone]  If 7PM-7AM, please contact night-coverage [email or web address] Password [email or web address] 10/03/2017, 8:18 AM

## 2017-10-04 LAB — BASIC METABOLIC PANEL
ANION GAP: 5 (ref 5–15)
BUN: 16 mg/dL (ref 6–20)
CO2: 29 mmol/L (ref 22–32)
CREATININE: 0.72 mg/dL (ref 0.44–1.00)
Calcium: 9.3 mg/dL (ref 8.9–10.3)
Chloride: 106 mmol/L (ref 101–111)
GFR calc non Af Amer: >60 mL/min (ref >60)
GLUCOSE: 94 mg/dL (ref 65–99)
Potassium: 3.2 mmol/L — ABNORMAL LOW (ref 3.5–5.1)
Sodium: 140 mmol/L (ref 135–145)

## 2017-10-04 LAB — CULTURE, BLOOD (ROUTINE X 2)
CULTURE: NO GROWTH
Culture: NO GROWTH
SPECIAL REQUESTS: ADEQUATE
SPECIAL REQUESTS: ADEQUATE

## 2017-10-04 MED ORDER — PROPRANOLOL HCL 60 MG PO TABS: 60.0000 mg | ORAL_TABLET | Freq: Four times a day (QID) | ORAL | 0 refills | Status: DC

## 2017-10-04 MED ORDER — LOPERAMIDE HCL 2 MG PO CAPS: 2.0000 mg | ORAL_CAPSULE | ORAL | 0 refills | Status: DC | PRN

## 2017-10-04 MED ORDER — METHIMAZOLE 10 MG PO TABS: 40.0000 mg | ORAL_TABLET | Freq: Two times a day (BID) | ORAL | 0 refills | Status: DC

## 2017-10-04 MED ORDER — POTASSIUM CHLORIDE CRYS ER 20 MEQ PO TBCR
40.0000 meq | EXTENDED_RELEASE_TABLET | Freq: Once | ORAL | Status: AC
  Administered 2017-10-04: 40 meq via ORAL
  Filled 2017-10-04: qty 2

## 2017-10-04 NOTE — Care Management Note (Signed)
Case Management Note Original Note Created Cherylann ParrClaxton, Samantha S, RN 10/01/2017, 2:39 PM  Patient Details  Name: [name] MRN: [id number] Date of Birth: 08/15/1946  Subjective/Objective:    Pt admitted with Thyrotoxicos syndrome             Action/Plan:  PTA independent from home.   CM will continue to follow for discharge needs   Expected Discharge Date:  10/04/17               Expected Discharge Plan:  Home w Home Health Services  In-House Referral:  NA  Discharge planning Services  CM Consult  Post Acute Care Choice:  Home Health, Durable Medical Equipment Choice offered to:  Patient  DME Arranged:  [facility] DME Agency:  [facility]  [facility]  Status of Service:  Completed, signed off  If discussed at Long Length of Stay Meetings, dates discussed:    Discharge Disposition: home/home health   Additional Comments:  10/04/17- 1020- Laquincy Eastridge RN, CM- pt for d/c home today- [facility] spoke with pt at bedside- [facility] she has [facility] in past and would like them again- has RW and 3n1 at home already- would like a cane- referral [facility]ermaine [facility] and [facility]- DME to be delivered to room prior to discharge.   Zenda AlpersWebster, PleasantonKristi Hall, RN 10/04/2017, 10:19 AM [phone]

## 2017-10-04 NOTE — Evaluation (Signed)
Physical Therapy Evaluation Patient Details Name: [name] MRN: [id number] DOB: 08/03/1946 Today's Date: 10/04/2017   History of Present Illness  Pt is a 71 y.o. female with PMH significant for CAD, GERD, HTN, and hyperthyroidism, admitted 09/29/17 with confusion and lightheadedness; worked up for thyroitoxicosis.    Clinical Impression  Patient evaluated by Physical Therapy with no further acute PT needs identified. PTA, pt mod indep with intermittent use of RW; lives with family available for 24/7 support. Today, amb with no AD and supervision; recommend use of SPC for increased stability. All education has been completed and the patient has no further questions. See below for equipment needs. PT is signing off. Thank you for this referral.    Follow Up Recommendations No PT follow up;Supervision - Intermittent    Equipment Recommendations  Cane    Recommendations for Other Services       Precautions / Restrictions Precautions Precautions: Fall Restrictions Weight Bearing Restrictions: No      Mobility  Bed Mobility Overal bed mobility: Independent                Transfers Overall transfer level: Independent                  Ambulation/Gait Ambulation/Gait assistance: Supervision Ambulation Distance (Feet): 200 Feet Assistive device: None Gait Pattern/deviations: Step-through pattern;Drifts right/left Gait velocity: Decreased   General Gait Details: Slightly unsteady gait with no AD and supervision for safety. Pt intermittently reaching to hand rail for balance. Pt not interested in using RW  Stairs            Wheelchair Mobility    Modified Rankin (Stroke Patients Only)       Balance Overall balance assessment: Needs assistance   Sitting balance-Leahy Scale: Good       Standing balance-Leahy Scale: Fair                               Pertinent Vitals/Pain Pain Assessment: Faces Faces Pain Scale: Hurts a little  bit Pain Location: Headache Pain Descriptors / Indicators: Headache Pain Intervention(s): Monitored during session    Home Living Family/patient expects to be discharged to:: Private residence Living Arrangements: Children Available Help at Discharge: Family;Friend(s);Available 24 hours/day Type of Home: House Home Access: Stairs to enter   Entergy CorporationEntrance Stairs-Number of Steps: 1 Home Layout: One level Home Equipment: Environmental consultantWalker - 2 wheels      Prior Function Level of Independence: Independent         Comments: Intermittent use of RW     Hand Dominance        Extremity/Trunk Assessment   Upper Extremity Assessment Upper Extremity Assessment: Overall WFL for tasks assessed    Lower Extremity Assessment Lower Extremity Assessment: Generalized weakness       Communication   Communication: No difficulties  Cognition Arousal/Alertness: Awake/alert Behavior During Therapy: WFL for tasks assessed/performed Overall Cognitive Status: Within Functional Limits for tasks assessed                                        General Comments      Exercises     Assessment/Plan    PT Assessment Patent does not need any further PT services  PT Problem List         PT Treatment Interventions      PT  Goals (Current goals can be found in the Care Plan section)  Acute Rehab PT Goals PT Goal Formulation: All assessment and education complete, DC therapy    Frequency     Barriers to discharge        Co-evaluation               AM-PAC PT "6 Clicks" Daily Activity  Outcome Measure Difficulty turning over in bed (including adjusting bedclothes, sheets and blankets)?: None Difficulty moving from lying on back to sitting on the side of the bed? : None Difficulty sitting down on and standing up from a chair with arms (e.g., wheelchair, bedside commode, etc,.)?: None Help needed moving to and from a bed to chair (including a wheelchair)?: None Help needed  walking in hospital room?: A Little Help needed climbing 3-5 steps with a railing? : A Little 6 Click Score: 22    End of Session Equipment Utilized During Treatment: Gait belt Activity Tolerance: Patient tolerated treatment well Patient left: in bed;with call bell/phone within reach Nurse Communication: Mobility status PT Visit Diagnosis: Other abnormalities of gait and mobility (R26.89)    Time: 1610-9604: 0929-0942 PT Time Calculation (min) (ACUTE ONLY): 13 min   Charges:   PT Evaluation $PT Eval Low Complexity: 1 Low     PT G Codes:       Ina HomesJaclyn Anajulia Leyendecker, PT, DPT Acute Rehab Services  Pager: [phone]  Malachy ChamberJaclyn L Monish [place] 10/04/2017, 9:50 AM

## 2017-10-04 NOTE — Discharge Summary (Signed)
Physician Discharge Summary  [name] ZOX:[id number] DOB: October 25, 1946 DOA: 09/29/2017  PCP: Shelva Majestic, MD  Admit date: 09/29/2017 Discharge date: 10/04/2017  Admitted From: Home  Disposition: Home   Recommendations for Outpatient Follow-up:  1. Follow up with PCP in 1-2 weeks 2. Please obtain BMP/CBC in one week 3. Follow up with Dr Gregary Signs for further care of hyperthyroidism. Needs repeat TSH and free T 3, T 4.    Home Health: yes, HH nurse.   Discharge Condition: stable.  CODE STATUS: Full code.  Diet recommendation: Heart Healthy   Brief/Interim Summary: Ashelynn Reynolds a 71 y.o.femalewith medical history significant forcoronary artery disease, GERD, hypertension, and hyperthyroidism followed by endocrinology, now presenting to the emergency department for evaluation of confusion and lightheadedness for the past several days. Patient had been seen by her endocrinologist 1 week ago with worsening hyperthyroidism and had her Tapazole increased. Due to some confusion, patient actually stopped her Tapazole at that time and has not taken any for the past week. Over this interval, she has become increasingly confused and "dizzy," described as a lightheadedness. She also reports palpitations, but denies chest pain. Denies dyspnea.   Assessment & Plan:   Principal Problem:   Thyrotoxicosis Active Problems:   HTN (hypertension)   GERD (gastroesophageal reflux disease)   CAD (coronary artery disease)   Hypokalemia   Hyperthyroidism   Elevated lactic acid level   Elevated troponin   SIRS (systemic inflammatory response syndrome) (HCC)   Leukopenia   1-Thyrotoxicosis; presents with confusion, tremors, palpitation.  Continue with propanolol, methimazole.  Treated with  IV hydrocortisone.  TSH 0.010, free T 4; 4. Free T 3 at 12/ Continue with current treatment.  Stop hydrocortisone.  Plan to discharge on 40 mg BID of methimazole. Diarrhea persist, indium PRN. She  is chest pain free, HR controlled, tremora improved. Confusion resolved.  Will arrange [facility] Nurse.   2-Positive troponin, Demand ischemia;  Related to thyrotoxicosis.  Elevated troponin; demand ischemia.  Continue with Imdur, aspirin, statins.  EKG with pronounce T wave inversion V 2-3 Received initially  Heparin gtt.  Cardiology sign off. ECHO Ef 55 %, hypokinesis , mid inferior myocardium.    3-SIRS; suspect related to number one.  Lactic acid normalized.  Blood culture and urine culture. No growth, stop IV antibiotics.    Syncope; in setting of thyrotoxicosis, N-stemi ECHO Ef 55 %  HTN; on propanolol. Continue with lisinopril.   GERD; on protonix.   Hypokalemia;  Resolved.      Discharge Diagnoses:  Principal Problem:   Thyrotoxicosis Active Problems:   HTN (hypertension)   GERD (gastroesophageal reflux disease)   CAD (coronary artery disease)   Hypokalemia   Hyperthyroidism   Elevated lactic acid level   Elevated troponin   SIRS (systemic inflammatory response syndrome) (HCC)   Leukopenia    Discharge Instructions  Discharge Instructions    Diet - low sodium heart healthy   Complete by:  As directed    Increase activity slowly   Complete by:  As directed      Allergies as of 10/04/2017      Reactions   Sulfa Drugs Cross Reactors Shortness Of Breath, Palpitations      Medication List    STOP taking these medications   Aloe Vera 25 MG Caps   atenolol 50 MG tablet Commonly known as:  TENORMIN     TAKE these medications   acetaminophen 325 MG tablet Commonly known as:  TYLENOL Take 650 mg by  mouth every 6 (six) hours as needed for moderate pain.   amLODipine 10 MG tablet Commonly known as:  NORVASC take 1 tablet by mouth once daily What changed:    how much to take  how to take this  when to take this   aspirin EC 81 MG tablet Take 81 mg daily by mouth.   atorvastatin 20 MG tablet Commonly known as:  LIPITOR take 1  tablet by mouth once daily What changed:    how much to take  how to take this  when to take this   cholecalciferol 400 units Tabs tablet Commonly known as:  VITAMIN D Take 400 Units daily by mouth.   esomeprazole 40 MG capsule Commonly known as:  NEXIUM Take 1 capsule (40 mg total) by mouth daily.   ezetimibe 10 MG tablet Commonly known as:  ZETIA take 1 tablet by mouth once daily What changed:    how much to take  how to take this  when to take this   feeding supplement (ENSURE ENLIVE) Liqd Take 237 mLs by mouth 2 (two) times daily between meals.   ferrous sulfate 325 (65 FE) MG tablet Take 1 tablet (325 mg total) by mouth daily.   isosorbide mononitrate 60 MG 24 hr tablet Commonly known as:  IMDUR take 2 tablets by mouth once daily What changed:    how much to take  how to take this  when to take this   lisinopril 20 MG tablet Commonly known as:  PRINIVIL,ZESTRIL take 1 tablet by mouth once daily What changed:    how much to take  how to take this  when to take this   loperamide 2 MG capsule Commonly known as:  IMODIUM Take 1 capsule (2 mg total) by mouth as needed for diarrhea or loose stools.   methimazole 10 MG tablet Commonly known as:  TAPAZOLE Take 4 tablets (40 mg total) by mouth 2 (two) times daily.   nitroGLYCERIN 0.4 MG SL tablet Commonly known as:  NITROSTAT PLACE 1 TABLET UNDER THE TONGUE EVERY 5 MINUTES FOR CHEST PAIN FOR 3 DOSES   oxybutynin 10 MG 24 hr tablet Commonly known as:  DITROPAN-XL Take 1 tablet (10 mg total) by mouth daily.   potassium chloride 10 MEQ tablet Commonly known as:  KLOR-CON 10 Take 1 tablet (10 mEq total) by mouth 2 (two) times daily.   propranolol 60 MG tablet Commonly known as:  INDERAL Take 1 tablet (60 mg total) by mouth every 6 (six) hours.   traMADol 50 MG tablet Commonly known as:  ULTRAM Take 1-2 tablets (50-100 mg total) by mouth every 6 (six) hours as needed for moderate pain.    venlafaxine XR 150 MG 24 hr capsule Commonly known as:  EFFEXOR-XR take 1 capsule by mouth at bedtime   VITAMIN B 12 PO Take 1 capsule daily by mouth.   vitamin C 500 MG tablet Commonly known as:  ASCORBIC ACID Take 500 mg daily by mouth.   vitamin E 1000 UNIT capsule Generic drug:  vitamin E Take 1,000 Units daily by mouth.            Durable Medical Equipment  (From admission, onward)        Start     Ordered   10/04/17 1018  For home use only DME Cane  Once     10/04/17 1018     Follow-up Information    [name], Stephen O, MD Follow up in 1 week(s).  Specialty:  Family Medicine Contact information: [address] [place] [address] [phone]        Romero Belling, MD Follow up in 2 week(s).   Specialty:  Endocrinology Contact information: [address] [place] [address] [phone]        [facility] - Dme Follow up.   Why:  cane to be delivered to room prior to discharge.  Contact information: [address] [place] [address] [phone]        [facility], [facility]-Home Follow up.   Specialty:  Home Health Services Why:  HHRN arranged- they will call you to set up home visits Contact information: [address] [place] [address] [phone]          Allergies  Allergen Reactions  . Sulfa Drugs Cross Reactors Shortness Of Breath and Palpitations    Consultations:  Cardiology    Procedures/Studies: Dg Chest 2 View  Result Date: 09/29/2017 CLINICAL DATA:  Syncope and chest pain EXAM: CHEST  2 VIEW COMPARISON:  Chest radiograph May 22, 2017 and chest CT May 22, 2017 FINDINGS: There is no edema or consolidation. Heart is borderline enlarged with pulmonary vascularity within normal limits. No adenopathy. There is aortic atherosclerosis. No evident bone lesions. IMPRESSION: Borderline cardiac enlargement. No edema or consolidation. There is aortic  atherosclerosis. Aortic Atherosclerosis (ICD10-I70.0). Electronically Signed   By: Bretta Bang III M.D.   On: 09/29/2017 18:28   Ct Head Wo Contrast  Result Date: 09/29/2017 CLINICAL DATA:  Altered mental status with intermittent confusion. History of seizures EXAM: CT HEAD WITHOUT CONTRAST TECHNIQUE: Contiguous axial images were obtained from the base of the skull through the vertex without intravenous contrast. COMPARISON:  May 21, 2017 an September 10, 2011 FINDINGS: Brain: The ventricles are normal in size and configuration. There is no evident intracranial mass, hemorrhage, extra-axial fluid collection, or midline shift. There is slight small vessel disease in the anterior centra semiovale bilaterally. Elsewhere gray-white compartments appear normal. There is no evident acute infarct. Vascular: There is no hyperdense vessel. There is mild calcification in each carotid siphon. 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 No other bone lesions evident. Sinuses/Orbits: There is mild mucosal thickening in several ethmoid air cells. Other visualized paranasal sinuses are clear. Orbits appear symmetric bilaterally. Other: Mastoid air cells are clear. IMPRESSION: Slight periventricular small vessel disease. Gray-white compartments otherwise appear normal. No mass or hemorrhage. Stable posterior left frontal osteoma. Bony calvarium otherwise unremarkable and stable. Areas of arterial vascular calcification noted. Mild mucosal thickening noted in several ethmoid air cells. Electronically Signed   By: Bretta Bang III M.D.   On: 09/29/2017 18:52       Subjective: She is still having diarrhea, imodium helps.  Tremors improved. No chest  Pain.   Discharge Exam: Vitals:   10/03/17 2014 10/04/17 0438  BP: 128/74 (!) 141/79  Pulse:    Resp: 18 18  Temp: 98 F (36.7 C) 97.9 F (36.6 C)  SpO2:  100%   Vitals:   10/03/17 1428 10/03/17 1430 10/03/17 2014 10/04/17 0438  BP: 122/67   128/74 (!) 141/79  Pulse:      Resp: (!) 28  18 18   Temp:   98 F (36.7 C) 97.9 F (36.6 C)  TempSrc:   Oral Oral  SpO2:  98%  100%  Weight:    59.6 kg (131 lb 8 oz)  Height:  General: Pt is alert, awake, not in acute distress Cardiovascular: RRR, S1/S2 +, no rubs, no gallops Respiratory: CTA bilaterally, no wheezing, no rhonchi Abdominal: Soft, NT, ND, bowel sounds + Extremities: no edema, no cyanosis    The results of significant diagnostics from this hospitalization (including imaging, microbiology, ancillary and laboratory) are listed below for reference.     Microbiology: Recent Results (from the past 240 hour(s))  Urine culture     Status: None   Collection Time: 09/29/17  5:45 PM  Result Value Ref Range Status   Specimen Description URINE, RANDOM  Final   Special Requests NONE  Final   Culture NO GROWTH  Final   Report Status 09/30/2017 FINAL  Final  Culture, blood (routine x 2)     Status: None (Preliminary result)   Collection Time: 09/29/17  6:00 PM  Result Value Ref Range Status   Specimen Description BLOOD BLOOD RIGHT FOREARM  Final   Special Requests   Final    BOTTLES DRAWN AEROBIC AND ANAEROBIC Blood Culture adequate volume   Culture NO GROWTH 4 DAYS  Final   Report Status PENDING  Incomplete  Culture, blood (routine x 2)     Status: None (Preliminary result)   Collection Time: 09/29/17  6:40 PM  Result Value Ref Range Status   Specimen Description BLOOD ARM LEFT  Final   Special Requests   Final    BOTTLES DRAWN AEROBIC AND ANAEROBIC Blood Culture adequate volume   Culture NO GROWTH 4 DAYS  Final   Report Status PENDING  Incomplete  MRSA PCR Screening     Status: None   Collection Time: 10/01/17 10:15 PM  Result Value Ref Range Status   MRSA by PCR NEGATIVE NEGATIVE Final    Comment:        The GeneXpert MRSA Assay (FDA approved for NASAL specimens only), is one component of a comprehensive MRSA colonization surveillance program. It is  not intended to diagnose MRSA infection nor to guide or monitor treatment for MRSA infections.      Labs: BNP (last 3 results) Recent Labs    05/22/17 1829  BNP 900.5*   Basic Metabolic Panel: Recent Labs  Lab 09/29/17 1552 09/30/17 0700 10/02/17 0226 10/04/17 0843  NA 138 139 140 140  K 3.1* 3.8 4.5 3.2*  CL 104 109 110 106  CO2 23 23 25 29   GLUCOSE 175* 102* 150* 94  BUN 14 9 26* 16  CREATININE 0.75 0.59 0.68 0.72  CALCIUM 10.2 9.6 9.6 9.3   Liver Function Tests: Recent Labs  Lab 09/29/17 1736  AST 39  ALT 55*  ALKPHOS 89  BILITOT 1.2  PROT 6.7  ALBUMIN 3.1*   No results for input(s): LIPASE, AMYLASE in the last 168 hours. No results for input(s): AMMONIA in the last 168 hours. CBC: Recent Labs  Lab 09/29/17 1552 09/30/17 0700 10/01/17 0250 10/02/17 0226  WBC 3.2* 4.7 5.5 7.7  HGB 11.6* 10.1* 9.2* 9.3*  HCT 34.3* 31.0* 27.8* 28.6*  MCV 92.2 92.0 91.1 92.0  PLT 259 213 192 207   Cardiac Enzymes: Recent Labs  Lab 09/29/17 2014 09/30/17 0323 09/30/17 0700  TROPONINI 0.47* 0.51* 0.37*   BNP: Invalid input(s): POCBNP CBG: Recent Labs  Lab 09/29/17 1556  GLUCAP 175*   D-Dimer No results for input(s): DDIMER in the last 72 hours. Hgb A1c No results for input(s): HGBA1C in the last 72 hours. Lipid Profile No results for input(s): CHOL, HDL, LDLCALC, TRIG, CHOLHDL, LDLDIRECT  in the last 72 hours. Thyroid function studies No results for input(s): TSH, T4TOTAL, T3FREE, THYROIDAB in the last 72 hours.  Invalid input(s): FREET3 Anemia work up No results for input(s): VITAMINB12, FOLATE, FERRITIN, TIBC, IRON, RETICCTPCT in the last 72 hours. Urinalysis    Component Value Date/Time   COLORURINE AMBER (A) 09/29/2017 1734   APPEARANCEUR CLEAR 09/29/2017 1734   LABSPEC 1.024 09/29/2017 1734   PHURINE 5.0 09/29/2017 1734   GLUCOSEU NEGATIVE 09/29/2017 1734   HGBUR NEGATIVE 09/29/2017 1734   BILIRUBINUR NEGATIVE 09/29/2017 1734    BILIRUBINUR n 05/10/2017 1507   KETONESUR NEGATIVE 09/29/2017 1734   PROTEINUR NEGATIVE 09/29/2017 1734   UROBILINOGEN 0.2 05/10/2017 1507   UROBILINOGEN 1.0 05/17/2014 0342   NITRITE NEGATIVE 09/29/2017 1734   LEUKOCYTESUR NEGATIVE 09/29/2017 1734   Sepsis Labs Invalid input(s): PROCALCITONIN,  WBC,  LACTICIDVEN Microbiology Recent Results (from the past 240 hour(s))  Urine culture     Status: None   Collection Time: 09/29/17  5:45 PM  Result Value Ref Range Status   Specimen Description URINE, RANDOM  Final   Special Requests NONE  Final   Culture NO GROWTH  Final   Report Status 09/30/2017 FINAL  Final  Culture, blood (routine x 2)     Status: None (Preliminary result)   Collection Time: 09/29/17  6:00 PM  Result Value Ref Range Status   Specimen Description BLOOD BLOOD RIGHT FOREARM  Final   Special Requests   Final    BOTTLES DRAWN AEROBIC AND ANAEROBIC Blood Culture adequate volume   Culture NO GROWTH 4 DAYS  Final   Report Status PENDING  Incomplete  Culture, blood (routine x 2)     Status: None (Preliminary result)   Collection Time: 09/29/17  6:40 PM  Result Value Ref Range Status   Specimen Description BLOOD ARM LEFT  Final   Special Requests   Final    BOTTLES DRAWN AEROBIC AND ANAEROBIC Blood Culture adequate volume   Culture NO GROWTH 4 DAYS  Final   Report Status PENDING  Incomplete  MRSA PCR Screening     Status: None   Collection Time: 10/01/17 10:15 PM  Result Value Ref Range Status   MRSA by PCR NEGATIVE NEGATIVE Final    Comment:        The GeneXpert MRSA Assay (FDA approved for NASAL specimens only), is one component of a comprehensive MRSA colonization surveillance program. It is not intended to diagnose MRSA infection nor to guide or monitor treatment for MRSA infections.      Time coordinating discharge: Over 30 minutes  SIGNED:   Alba Cory, MD  [facility] 10/04/2017, 10:52 AM Pager [phone]  If 7PM-7AM, please  contact night-coverage [email or web address] Password [email or web address]

## 2017-10-04 NOTE — Evaluation (Signed)
Occupational Therapy Evaluation Patient Details Name: [name] MRN: [id number] DOB: 03/20/1946 Today's Date: 10/04/2017    History of Present Illness Pt is a 71 y.o. female with PMH significant for CAD, GERD, HTN, and hyperthyroidism, admitted 09/29/17 with confusion and lightheadedness; worked up for thyroitoxicosis.   Clinical Impression   Pt is I with ADL activity - no further OT needs.  Pts family will A as needed    Follow Up Recommendations  No OT follow up    Equipment Recommendations  None recommended by OT    Recommendations for Other Services       Precautions / Restrictions Precautions Precautions: Fall Restrictions Weight Bearing Restrictions: No      Mobility Bed Mobility Overal bed mobility: Independent                Transfers Overall transfer level: Independent                    Balance Overall balance assessment: Needs assistance   Sitting balance-Leahy Scale: Good       Standing balance-Leahy Scale: Fair                             ADL either performed or assessed with clinical judgement   ADL Overall ADL's : Independent                                             Vision Baseline Vision/History: No visual deficits              Pertinent Vitals/Pain Pain Assessment: Faces Pain Score: 3  Faces Pain Scale: Hurts a little bit Pain Location: stomach Pain Descriptors / Indicators: Sore Pain Intervention(s): Limited activity within patient's tolerance;Repositioned     Hand Dominance     Extremity/Trunk Assessment Upper Extremity Assessment Upper Extremity Assessment: Overall WFL for tasks assessed   Lower Extremity Assessment Lower Extremity Assessment: Generalized weakness       Communication Communication Communication: No difficulties   Cognition Arousal/Alertness: Awake/alert Behavior During Therapy: WFL for tasks assessed/performed Overall Cognitive Status: Within  Functional Limits for tasks assessed                                                Home Living Family/patient expects to be discharged to:: Private residence Living Arrangements: Children Available Help at Discharge: Family;Friend(s);Available 24 hours/day Type of Home: House Home Access: Stairs to enter Entergy CorporationEntrance Stairs-Number of Steps: 1   Home Layout: One level     Bathroom Shower/Tub: Chief Strategy OfficerTub/shower unit   Bathroom Toilet: Standard     Home Equipment: Environmental consultantWalker - 2 wheels          Prior Functioning/Environment Level of Independence: Independent        Comments: Intermittent use of RW                 OT Goals(Current goals can be found in the care plan section) Acute Rehab OT Goals Patient Stated Goal: home today OT Goal Formulation: With patient  OT Frequency:      AM-PAC PT "6 Clicks" Daily Activity     Outcome Measure Help from another person eating meals?: None Help from another  person taking care of personal grooming?: None Help from another person toileting, which includes using toliet, bedpan, or urinal?: None Help from another person bathing (including washing, rinsing, drying)?: None Help from another person to put on and taking off regular upper body clothing?: None Help from another person to put on and taking off regular lower body clothing?: None 6 Click Score: 24   End of Session Nurse Communication: Mobility status  Activity Tolerance: Patient tolerated treatment well Patient left: in bed                   Time: [id number] OT Time Calculation (min): 14 min Charges:  OT General Charges $OT Visit: 1 Visit OT Evaluation $OT Eval Low Complexity: 1 Low G-Codes:     Katherine AuerLori Jon Reynolds, [place]OT [phone]  Einar CrowEDDING, [name] 10/04/2017, 12:47 PM

## 2017-10-06 ENCOUNTER — Telehealth: Payer: Self-pay

## 2017-10-06 NOTE — Telephone Encounter (Signed)
Unable to reach for TCM call.  Left message for patient to return call.

## 2017-10-06 NOTE — Telephone Encounter (Signed)
Unable to reach patient for TCM call.  Left message for patient to return call.

## 2017-10-09 NOTE — Progress Notes (Signed)
Pt given discharge instructions, medication lists, follow up appointments, and when to call the doctor.  Pt verbalizes understanding. Patient transported to main entrance for pick up by [employer] driver.  [facility].  I gave to CM Kristi [facility] if can have it delivered to her home.  Patient to stay with her son for a few days until weather is better.

## 2017-10-13 ENCOUNTER — Telehealth: Payer: Self-pay | Admitting: Family Medicine

## 2017-10-13 NOTE — Telephone Encounter (Signed)
MEDICATION: lisinopril (PRINIVIL,ZESTRIL) 20 MG tablet  PHARMACY:  [facility] - [place], [place] - [address] [phone] (Phone) [phone])      IS THIS A 90 DAY SUPPLY : yes  IS PATIENT OUT OF MEDICATION:   IF NOT; HOW MUCH IS LEFT:   LAST APPOINTMENT DATE: @10 /12/18  NEXT APPOINTMENT DATE:@Visit  date not found  OTHER COMMENTS:    **Let patient know to contact pharmacy at the end of the day to make sure medication is ready. **  ** Please notify patient to allow 48-72 hours to process**  **Encourage patient to contact the pharmacy for refills or they can [facility]**

## 2017-10-14 ENCOUNTER — Other Ambulatory Visit: Payer: Self-pay

## 2017-10-14 MED ORDER — LISINOPRIL 20 MG PO TABS: 20.0000 mg | ORAL_TABLET | Freq: Every day | ORAL | 1 refills | Status: DC

## 2017-10-14 NOTE — Telephone Encounter (Signed)
Prescription sent to pharmacy as requested.

## 2017-10-28 DIAGNOSIS — I2510 Atherosclerotic heart disease of native coronary artery without angina pectoris: Secondary | ICD-10-CM | POA: Diagnosis not present

## 2017-10-28 DIAGNOSIS — E0590 Thyrotoxicosis, unspecified without thyrotoxic crisis or storm: Secondary | ICD-10-CM | POA: Diagnosis not present

## 2017-10-28 DIAGNOSIS — K219 Gastro-esophageal reflux disease without esophagitis: Secondary | ICD-10-CM | POA: Diagnosis not present

## 2017-10-28 DIAGNOSIS — R740 Nonspecific elevation of levels of transaminase and lactic acid dehydrogenase [LDH]: Secondary | ICD-10-CM | POA: Diagnosis not present

## 2017-10-28 DIAGNOSIS — Z7982 Long term (current) use of aspirin: Secondary | ICD-10-CM | POA: Diagnosis not present

## 2017-10-28 DIAGNOSIS — I10 Essential (primary) hypertension: Secondary | ICD-10-CM | POA: Diagnosis not present

## 2017-10-28 DIAGNOSIS — R7989 Other specified abnormal findings of blood chemistry: Secondary | ICD-10-CM | POA: Diagnosis not present

## 2017-11-03 ENCOUNTER — Telehealth: Payer: Self-pay | Admitting: Family Medicine

## 2017-11-03 DIAGNOSIS — R740 Nonspecific elevation of levels of transaminase and lactic acid dehydrogenase [LDH]: Secondary | ICD-10-CM | POA: Diagnosis not present

## 2017-11-03 DIAGNOSIS — E0590 Thyrotoxicosis, unspecified without thyrotoxic crisis or storm: Secondary | ICD-10-CM | POA: Diagnosis not present

## 2017-11-03 DIAGNOSIS — Z7982 Long term (current) use of aspirin: Secondary | ICD-10-CM | POA: Diagnosis not present

## 2017-11-03 DIAGNOSIS — I2510 Atherosclerotic heart disease of native coronary artery without angina pectoris: Secondary | ICD-10-CM | POA: Diagnosis not present

## 2017-11-03 DIAGNOSIS — I10 Essential (primary) hypertension: Secondary | ICD-10-CM | POA: Diagnosis not present

## 2017-11-03 DIAGNOSIS — K219 Gastro-esophageal reflux disease without esophagitis: Secondary | ICD-10-CM | POA: Diagnosis not present

## 2017-11-03 DIAGNOSIS — R7989 Other specified abnormal findings of blood chemistry: Secondary | ICD-10-CM | POA: Diagnosis not present

## 2017-11-03 NOTE — Telephone Encounter (Signed)
MEDICATION: atorvastatin (LIPITOR) 20 MG tablet  PHARMACY:   [facility] - [place] - [address] [phone] (Phone) [phone])     IS THIS A 90 DAY SUPPLY : yes  IS PATIENT OUT OF MEDICATION:   IF NOT; HOW MUCH IS LEFT: 08/06/17  LAST APPOINTMENT DATE: @12 /20/2018  NEXT APPOINTMENT DATE:@Visit  date not found  OTHER COMMENTS:    **Let patient know to contact pharmacy at the end of the day to make sure medication is ready. **  ** Please notify patient to allow 48-72 hours to process**  **Encourage patient to contact the pharmacy for refills or they can [facility]**

## 2017-11-03 NOTE — Telephone Encounter (Unsigned)
Copied from CRM [id number]. Topic: Quick Communication - See Telephone Encounter >> Nov 03, 2017  3:07 PM Floria RavelingStovall, Shana A wrote: CRM for notification. See Telephone encounter for: Alex[facility] with [facility] [phone] Pt is taking iron tabs and is having trouble going to the bathroom.  She doesn't want to take a stool softner she wants a different type of iron  11/03/17.

## 2017-11-03 NOTE — Telephone Encounter (Signed)
Please contact patient to advise °

## 2017-11-05 MED ORDER — ATORVASTATIN CALCIUM 20 MG PO TABS: ORAL_TABLET | ORAL | 0 refills | Status: DC

## 2017-11-05 NOTE — Telephone Encounter (Signed)
Tried to contact pt, unable to leave message voicemail is not set up.

## 2017-11-17 DIAGNOSIS — Z7982 Long term (current) use of aspirin: Secondary | ICD-10-CM | POA: Diagnosis not present

## 2017-11-17 DIAGNOSIS — K219 Gastro-esophageal reflux disease without esophagitis: Secondary | ICD-10-CM | POA: Diagnosis not present

## 2017-11-17 DIAGNOSIS — E0590 Thyrotoxicosis, unspecified without thyrotoxic crisis or storm: Secondary | ICD-10-CM | POA: Diagnosis not present

## 2017-11-17 DIAGNOSIS — I2510 Atherosclerotic heart disease of native coronary artery without angina pectoris: Secondary | ICD-10-CM | POA: Diagnosis not present

## 2017-11-17 DIAGNOSIS — R740 Nonspecific elevation of levels of transaminase and lactic acid dehydrogenase [LDH]: Secondary | ICD-10-CM | POA: Diagnosis not present

## 2017-11-17 DIAGNOSIS — R7989 Other specified abnormal findings of blood chemistry: Secondary | ICD-10-CM | POA: Diagnosis not present

## 2017-11-17 DIAGNOSIS — I10 Essential (primary) hypertension: Secondary | ICD-10-CM | POA: Diagnosis not present

## 2017-11-24 ENCOUNTER — Ambulatory Visit: Payer: Medicare HMO | Admitting: Endocrinology

## 2017-11-24 DIAGNOSIS — Z0289 Encounter for other administrative examinations: Secondary | ICD-10-CM

## 2017-11-26 ENCOUNTER — Other Ambulatory Visit: Payer: Self-pay

## 2017-11-26 MED ORDER — OXYBUTYNIN CHLORIDE ER 10 MG PO TB24: 10.0000 mg | ORAL_TABLET | Freq: Every day | ORAL | 3 refills | Status: DC

## 2017-11-26 MED ORDER — ESOMEPRAZOLE MAGNESIUM 40 MG PO CPDR: 40.0000 mg | DELAYED_RELEASE_CAPSULE | Freq: Every day | ORAL | 3 refills | Status: DC

## 2017-12-02 NOTE — Progress Notes (Deleted)
Cardiology Office Note    Date:  12/02/2017   ID:  [name], DOB 1945-10-27, MRN [id number]  PCP:  Shelva Majestic, MD  Cardiologist: Lesleigh Noe, MD   No chief complaint on file.   History of Present Illness:  [name] is a 72 y.o. female with hx of CAD with coronary spasm, HTN, HLD, Grave's Dz and multinodular goiter (followed by Dr. Haynes Kerns) presents for chest pain.      Past Medical History:  Diagnosis Date  . Arthritis   . Chronic lower back pain   . Cluster headache   . Coronary artery disease    a. 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 non obs dz LHC 2007. c. Non obs dz (mild-mod) by [facility] 05/17/14 d. cath 05/26/17 mild non obstructive CAD  . Diverticulitis    a. Hx microperf 2012 - hospitalizated.  Marland Kitchen GERD (gastroesophageal reflux disease)   . Hypercholesteremia   . Hypertension   . PVC's (premature ventricular contractions)    a. Hx of trigeminy/bigeminy.  . Seizures (HCC)   . Thyroid disease    hyperthyroid  . UTI (lower urinary tract infection)     Past Surgical History:  Procedure Laterality Date  . ABDOMINAL HYSTERECTOMY  1987   "partial"-still has ovaries  . CARDIAC CATHETERIZATION  05/17/2014  . CORONARY ANGIOPLASTY WITH STENT PLACEMENT  1995   "1"  . JOINT REPLACEMENT     right knee  . LEFT HEART CATH AND CORONARY ANGIOGRAPHY N/A 05/26/2017   Procedure: Left Heart Cath and Coronary Angiography;  Surgeon: Corky Crafts, MD;  Location: [facility] INVASIVE CV LAB;  Service: Cardiovascular;  Laterality: N/A;  . LEFT HEART CATHETERIZATION WITH CORONARY ANGIOGRAM N/A 05/17/2014   Procedure: LEFT HEART CATHETERIZATION WITH CORONARY ANGIOGRAM;  Surgeon: Marykay Lex, MD;  Location: [facility] CATH LAB;  Service: Cardiovascular;  Laterality: N/A;  . TOTAL KNEE ARTHROPLASTY Right 2005  . TUBAL LIGATION  1970  . VASCULAR SURGERY      Current Medications: Outpatient Medications Prior to Visit  Medication Sig Dispense Refill  . acetaminophen (TYLENOL) 325 MG tablet  Take 650 mg by mouth every 6 (six) hours as needed for moderate pain.     Marland Kitchen amLODipine (NORVASC) 10 MG tablet take 1 tablet by mouth once daily (Patient taking differently: take 1 tablet(10mg ) by mouth once daily) 30 tablet 5  . aspirin EC 81 MG tablet Take 81 mg daily by mouth.    Marland Kitchen atorvastatin (LIPITOR) 20 MG tablet take 1 tablet (20mg ) by mouth once daily 90 tablet 0  . cholecalciferol (VITAMIN D) 400 UNITS TABS tablet Take 400 Units daily by mouth.     . Cyanocobalamin (VITAMIN B 12 PO) Take 1 capsule daily by mouth.     . esomeprazole (NEXIUM) 40 MG capsule Take 1 capsule (40 mg total) by mouth daily. 90 capsule 3  . ezetimibe (ZETIA) 10 MG tablet take 1 tablet by mouth once daily (Patient taking differently: take 1 tablet(10mg ) by mouth once daily) 30 tablet 5  . feeding supplement, ENSURE ENLIVE, (ENSURE ENLIVE) LIQD Take 237 mLs by mouth 2 (two) times daily between meals. 237 mL 12  . ferrous sulfate 325 (65 FE) MG tablet Take 1 tablet (325 mg total) by mouth daily. 30 tablet 0  . isosorbide mononitrate (IMDUR) 60 MG 24 hr tablet take 2 tablets by mouth once daily (Patient taking differently: take 2 tablets(120mg ) by mouth once daily) 60 tablet 5  . lisinopril (PRINIVIL,ZESTRIL) 20 MG  tablet Take 1 tablet (20 mg total) by mouth daily. 90 tablet 1  . loperamide (IMODIUM) 2 MG capsule Take 1 capsule (2 mg total) by mouth as needed for diarrhea or loose stools. 30 capsule 0  . methimazole (TAPAZOLE) 10 MG tablet Take 4 tablets (40 mg total) by mouth 2 (two) times daily. 90 tablet 0  . nitroGLYCERIN (NITROSTAT) 0.4 MG SL tablet PLACE 1 TABLET UNDER THE TONGUE EVERY 5 MINUTES FOR CHEST PAIN FOR 3 DOSES 25 tablet 1  . oxybutynin (DITROPAN-XL) 10 MG 24 hr tablet Take 1 tablet (10 mg total) by mouth daily. 90 tablet 3  . potassium chloride (KLOR-CON 10) 10 MEQ tablet Take 1 tablet (10 mEq total) by mouth 2 (two) times daily. 60 tablet 5  . propranolol (INDERAL) 60 MG tablet Take 1 tablet (60 mg  total) by mouth every 6 (six) hours. 120 tablet 0  . traMADol (ULTRAM) 50 MG tablet Take 1-2 tablets (50-100 mg total) by mouth every 6 (six) hours as needed for moderate pain. 30 tablet 0  . venlafaxine XR (EFFEXOR-XR) 150 MG 24 hr capsule take 1 capsule by mouth at bedtime 30 capsule 5  . vitamin C (ASCORBIC ACID) 500 MG tablet Take 500 mg daily by mouth.     . vitamin E (VITAMIN E) 1000 UNIT capsule Take 1,000 Units daily by mouth.      No facility-administered medications prior to visit.      Allergies:   Sulfa drugs cross reactors   Social History   Socioeconomic History  . Marital status: Married    Spouse name: Not on file  . Number of children: 4  . Years of education: 4  . Highest education level: Not on file  Social Needs  . Financial resource strain: Not on file  . Food insecurity - worry: Not on file  . Food insecurity - inability: Not on file  . Transportation needs - medical: Not on file  . Transportation needs - non-medical: Not on file  Occupational History    Comment: retired  Tobacco Use  . Smoking status: Never Smoker  . Smokeless tobacco: Never Used  Substance and Sexual Activity  . Alcohol use: No  . Drug use: No  . Sexual activity: No  Other Topics Concern  . Not on file  Social History Narrative   Separated. 4 children from first marriage. 16 grandkids.       Retired from [employer] for 25 years, went to [employer] for 5 years. Retired 2003 after MI      Hobbies: babysit/active with children      Right-handed      Caffeine: 24 oz soda per day        Family History:  The patient's ***family history includes CAD in her brother and father; Diabetes in her brother, father, mother, and sister; Thyroid disease in her sister.   ROS:   Please see the history of present illness.    ***  All other systems reviewed and are negative.   PHYSICAL EXAM:   VS:  There were no vitals taken for this visit.   GEN: Well nourished, well developed, in no acute  distress  HEENT: normal  Neck: no JVD, carotid bruits, or masses Cardiac: ***RRR; no murmurs, rubs, or gallops,no edema  Respiratory:  clear to auscultation bilaterally, normal work of breathing GI: soft, nontender, nondistended, + BS MS: no deformity or atrophy  Skin: warm and dry, no rash Neuro:  Alert and Oriented x  3, Strength and sensation are intact Psych: euthymic mood, full affect  Wt Readings from Last 3 Encounters:  10/04/17 131 lb 8 oz (59.6 kg)  09/24/17 123 lb 6.4 oz (56 kg)  09/02/17 132 lb (59.9 kg)      Studies/Labs Reviewed:   EKG:  EKG  ***  Recent Labs: 05/10/2017: Pro B Natriuretic peptide (BNP) 289.0 05/22/2017: B Natriuretic Peptide 900.5 05/24/2017: Magnesium 1.9 09/29/2017: ALT 55; TSH <0.010 10/02/2017: Hemoglobin 9.3; Platelets 207 10/04/2017: BUN 16; Creatinine, Ser 0.72; Potassium 3.2; Sodium 140   Lipid Panel    Component Value Date/Time   CHOL 75 05/23/2017 0514   TRIG 58 05/23/2017 0514   HDL 25 (L) 05/23/2017 0514   CHOLHDL 3.0 05/23/2017 0514   VLDL 12 05/23/2017 0514   LDLCALC 38 05/23/2017 0514    Additional studies/ records that were reviewed today include:  ***    ASSESSMENT:    1. Coronary artery disease involving native coronary artery of native heart with angina pectoris (HCC)   2. Essential hypertension   3. Hypercholesteremia      PLAN:  In order of problems listed above:  1. ***    Medication Adjustments/Labs and Tests Ordered: Current medicines are reviewed at length with the patient today.  Concerns regarding medicines are outlined above.  Medication changes, Labs and Tests ordered today are listed in the Patient Instructions below. There are no Patient Instructions on file for this visit.   Signed, Lesleigh Noe, MD  12/02/2017 7:47 PM    [facility] [address] [place], [place], [place]  [address] Phone: [phone]; Fax: [phone]

## 2017-12-03 ENCOUNTER — Ambulatory Visit: Payer: Medicare HMO | Admitting: Interventional Cardiology

## 2017-12-16 ENCOUNTER — Encounter: Payer: Self-pay | Admitting: Interventional Cardiology

## 2017-12-20 ENCOUNTER — Other Ambulatory Visit: Payer: Self-pay | Admitting: Family Medicine

## 2017-12-22 ENCOUNTER — Telehealth: Payer: Self-pay | Admitting: *Deleted

## 2017-12-22 ENCOUNTER — Ambulatory Visit: Payer: Medicare HMO | Admitting: *Deleted

## 2017-12-22 ENCOUNTER — Inpatient Hospital Stay: Payer: Medicare HMO | Admitting: Family Medicine

## 2017-12-22 ENCOUNTER — Encounter: Payer: Self-pay | Admitting: Physician Assistant

## 2017-12-22 ENCOUNTER — Ambulatory Visit (INDEPENDENT_AMBULATORY_CARE_PROVIDER_SITE_OTHER): Payer: Medicare HMO | Admitting: Physician Assistant

## 2017-12-22 VITALS — BP 140/90 | HR 67 | Temp 98.7°F | Ht 66.0 in | Wt 133.5 lb

## 2017-12-22 DIAGNOSIS — G43109 Migraine with aura, not intractable, without status migrainosus: Secondary | ICD-10-CM | POA: Diagnosis not present

## 2017-12-22 DIAGNOSIS — E0590 Thyrotoxicosis, unspecified without thyrotoxic crisis or storm: Secondary | ICD-10-CM | POA: Diagnosis not present

## 2017-12-22 DIAGNOSIS — D649 Anemia, unspecified: Secondary | ICD-10-CM | POA: Insufficient documentation

## 2017-12-22 DIAGNOSIS — R51 Headache: Secondary | ICD-10-CM | POA: Diagnosis not present

## 2017-12-22 DIAGNOSIS — R519 Headache, unspecified: Secondary | ICD-10-CM | POA: Insufficient documentation

## 2017-12-22 HISTORY — DX: Anemia, unspecified: D64.9

## 2017-12-22 LAB — CBC WITH DIFFERENTIAL/PLATELET
BASOS ABS: 0 10*3/uL (ref 0.0–0.1)
BASOS PCT: 1 % (ref 0.0–3.0)
EOS PCT: 7.8 % — AB (ref 0.0–5.0)
Eosinophils Absolute: 0.3 10*3/uL (ref 0.0–0.7)
HCT: 39.1 % (ref 36.0–46.0)
Hemoglobin: 12.8 g/dL (ref 12.0–15.0)
Lymphocytes Relative: 47.2 % — ABNORMAL HIGH (ref 12.0–46.0)
Lymphs Abs: 2 10*3/uL (ref 0.7–4.0)
MCHC: 32.6 g/dL (ref 30.0–36.0)
MCV: 96.8 fl (ref 78.0–100.0)
MONOS PCT: 9.7 % (ref 3.0–12.0)
Monocytes Absolute: 0.4 10*3/uL (ref 0.1–1.0)
NEUTROS ABS: 1.5 10*3/uL (ref 1.4–7.7)
Neutrophils Relative %: 34.3 % — ABNORMAL LOW (ref 43.0–77.0)
PLATELETS: 230 10*3/uL (ref 150.0–400.0)
RBC: 4.04 Mil/uL (ref 3.87–5.11)
RDW: 16.2 % — AB (ref 11.5–15.5)
WBC: 4.3 10*3/uL (ref 4.0–10.5)

## 2017-12-22 LAB — COMPREHENSIVE METABOLIC PANEL
ALT: 24 U/L (ref 0–35)
AST: 25 U/L (ref 0–37)
Albumin: 4 g/dL (ref 3.5–5.2)
Alkaline Phosphatase: 75 U/L (ref 39–117)
BUN: 12 mg/dL (ref 6–23)
CALCIUM: 10 mg/dL (ref 8.4–10.5)
CHLORIDE: 105 meq/L (ref 96–112)
CO2: 29 meq/L (ref 19–32)
Creatinine, Ser: 1.21 mg/dL — ABNORMAL HIGH (ref 0.40–1.20)
GFR: 56.39 mL/min — AB (ref >60.00)
GLUCOSE: 78 mg/dL (ref 70–99)
POTASSIUM: 4.8 meq/L (ref 3.5–5.1)
Sodium: 139 mEq/L (ref 135–145)
Total Bilirubin: 0.7 mg/dL (ref 0.2–1.2)
Total Protein: 7.5 g/dL (ref 6.0–8.3)

## 2017-12-22 MED ORDER — PROPRANOLOL HCL 60 MG PO TABS: 60.0000 mg | ORAL_TABLET | Freq: Two times a day (BID) | ORAL | 0 refills | Status: DC

## 2017-12-22 MED ORDER — TRAMADOL HCL 50 MG PO TABS: 50.0000 mg | ORAL_TABLET | Freq: Four times a day (QID) | ORAL | 0 refills | Status: DC | PRN

## 2017-12-22 MED ORDER — FERROUS SULFATE DRIED ER 45 MG PO TBCR: 1.0000 | EXTENDED_RELEASE_TABLET | Freq: Every day | ORAL | 1 refills | Status: DC

## 2017-12-22 NOTE — Telephone Encounter (Signed)
Samantha requested checking database.  Controlled substance: Tramadol 50 mg  Pharmacy: [facility]  Status: Rx last filled on 03/22/2017, dispensed #90 tablets  New Findings: None   Additional Comments: Samantha notified.

## 2017-12-22 NOTE — Assessment & Plan Note (Addendum)
[place] Controlled Substance Database reviewed today regarding patient. Patient is compliant with [facility] regarding pharmacy use and one-prescribing provider. Declines pain contract today. Has been receiving tramadol from Dr. Tana ConchStephen Hunter. Most recent tramadol refill in May of last year. I did provide a short prescription of 30 tablets. Must get additional tablets from PCP. Discussed this with Dr. Earlene PlaterWallace.

## 2017-12-22 NOTE — Progress Notes (Signed)
[name] is a 72 y.o. female is here to discuss:  I acted as a Neurosurgeonscribe for [employer]antha Royden Bulman, PA-C Katherine Mullonna Orphanos, LPN  History of Present Illness:   Chief Complaint  Patient presents with  . Hospitalization Follow-up    HPI   Patient was hospitalized for thyrotoxicosis in Dec. Had seen Dr. Everardo Reynolds a week prior to her hospitalization, and was told to increase her Tapazole dose but misunderstood him and thought she was supposed to discontinue it, she was therefore admitted with thyrotoxicosis. Did not follow-up with our office as was instructed or with Dr. Romero BellingSean Reynolds. Is taking her Tapazole "faithfully" now at this point. She is almost out of her propranolol, reports that she only has 1 pill left. Reviewed AVS from the hospital visit. It was recommended that she have a CBC and CMP performed within 1 week postdischarge, which has not been drawn yet. She does suffer from anemia and does not take over-the-counter iron because it causes issues with her stomach. Her hemoglobin was found to be 9.3 in the hospital. She does report that she continues to have some intermittent lightheadedness and dizziness, but this is not unusual for her. I reviewed her hospital discharge admission and summary.  She is also here for refill for tramadol. She has been taking this as well as Tylenol, for headache and backache. Her most recent refill was in May and she was dispensed 90 tablets. This was refilled by her PCP, Dr. Durene Reynolds.  She denies any other concerns today.  Health Maintenance Due  Topic Date Due  . MAMMOGRAM  10/29/1996  . COLONOSCOPY  10/29/1996  . DEXA SCAN  10/30/2011    Past Medical History:  Diagnosis Date  . Arthritis   . Chronic lower back pain   . Cluster headache   . Coronary artery disease    a. 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 non obs dz LHC 2007. c. Non obs dz (mild-mod) [facility] 05/17/14 d. cath 05/26/17 mild non obstructive CAD  . Diverticulitis    a. Hx microperf 2012 - hospitalizated.   Marland Kitchen. GERD (gastroesophageal reflux disease)   . Hypercholesteremia   . Hypertension   . PVC's (premature ventricular contractions)    a. Hx of trigeminy/bigeminy.  . Seizures (HCC)   . Thyroid disease    hyperthyroid  . UTI (lower urinary tract infection)      Social History   Socioeconomic History  . Marital status: Married    Spouse name: Not on file  . Number of children: 4  . Years of education: 4  . Highest education level: Not on file  Social Needs  . Financial resource strain: Not on file  . Food insecurity - worry: Not on file  . Food insecurity - inability: Not on file  . Transportation needs - medical: Not on file  . Transportation needs - non-medical: Not on file  Occupational History    Comment: retired  Tobacco Use  . Smoking status: Never Smoker  . Smokeless tobacco: Never Used  Substance and Sexual Activity  . Alcohol use: No  . Drug use: No  . Sexual activity: No  Other Topics Concern  . Not on file  Social History Narrative   Separated. 4 children from first marriage. 16 grandkids.       Retired from [employer] for 25 years, went to [employer] for 5 years. Retired 2003 after MI      Hobbies: babysit/active with children      Right-handed  Caffeine: 24 oz soda per day       Past Surgical History:  Procedure Laterality Date  . ABDOMINAL HYSTERECTOMY  1987   "partial"-still has ovaries  . CARDIAC CATHETERIZATION  05/17/2014  . CORONARY ANGIOPLASTY WITH STENT PLACEMENT  1995   "1"  . JOINT REPLACEMENT     right knee  . LEFT HEART CATH AND CORONARY ANGIOGRAPHY N/A 05/26/2017   Procedure: Left Heart Cath and Coronary Angiography;  Surgeon: Katherine Crafts, MD;  Location: [facility] INVASIVE CV LAB;  Service: Cardiovascular;  Laterality: N/A;  . LEFT HEART CATHETERIZATION WITH CORONARY ANGIOGRAM N/A 05/17/2014   Procedure: LEFT HEART CATHETERIZATION WITH CORONARY ANGIOGRAM;  Surgeon: Marykay Lex, MD;  Location: [facility] CATH LAB;  Service: Cardiovascular;   Laterality: N/A;  . TOTAL KNEE ARTHROPLASTY Right 2005  . TUBAL LIGATION  1970  . VASCULAR SURGERY      Family History  Problem Relation Age of Onset  . CAD Brother   . Diabetes Brother   . CAD Father   . Diabetes Father   . Diabetes Mother        father, sister, brothers  . Diabetes Sister   . Thyroid disease Sister     PMHx, SurgHx, SocialHx, FamHx, Medications, and Allergies were reviewed in the Visit Navigator and updated as appropriate.   Patient Active Problem List   Diagnosis Date Noted  . Anemia 12/22/2017  . Intractable headache 12/22/2017  . SIRS (systemic inflammatory response syndrome) (HCC) 09/29/2017  . Demand ischemia (HCC)   . Hyperthyroidism 05/22/2017  . Dark stools 05/22/2017  . Protein-calorie malnutrition, moderate (HCC) 05/22/2017  . Overactive bladder 09/19/2014  . Migraine 09/19/2014  . Coronary artery disease involving native coronary artery of native heart with angina pectoris (HCC)   . Seizures (HCC)   . Arthritis   . Hypercholesteremia   . GERD (gastroesophageal reflux disease) 10/04/2011  . HTN (hypertension) 10/01/2011    Social History   Tobacco Use  . Smoking status: Never Smoker  . Smokeless tobacco: Never Used  Substance Use Topics  . Alcohol use: No  . Drug use: No    Current Medications and Allergies:    Current Outpatient Medications:  .  acetaminophen (TYLENOL) 325 MG tablet, Take 650 mg by mouth every 6 (six) hours as needed for moderate pain. , Disp: , Rfl:  .  amLODipine (NORVASC) 10 MG tablet, take 1 tablet by mouth once daily (Patient taking differently: take 1 tablet(10mg ) by mouth once daily), Disp: 30 tablet, Rfl: 5 .  aspirin 325 MG EC tablet, Take 325 mg by mouth every other day., Disp: , Rfl:  .  atorvastatin (LIPITOR) 20 MG tablet, take 1 tablet (20mg ) by mouth once daily, Disp: 90 tablet, Rfl: 0 .  B Complex Vitamins (VITAMIN B COMPLEX) TABS, Take 1 tablet by mouth daily., Disp: , Rfl:  .  cholecalciferol  (VITAMIN D) 400 UNITS TABS tablet, Take 400 Units daily by mouth. , Disp: , Rfl:  .  esomeprazole (NEXIUM) 40 MG capsule, Take 1 capsule (40 mg total) by mouth daily., Disp: 90 capsule, Rfl: 3 .  ezetimibe (ZETIA) 10 MG tablet, take 1 tablet by mouth once daily (Patient taking differently: take 1 tablet(10mg ) by mouth once daily), Disp: 30 tablet, Rfl: 5 .  feeding supplement, ENSURE ENLIVE, (ENSURE ENLIVE) LIQD, Take 237 mLs by mouth 2 (two) times daily between meals., Disp: 237 mL, Rfl: 12 .  ferrous sulfate 325 (65 FE) MG tablet, Take 1 tablet (325  mg total) by mouth daily., Disp: 30 tablet, Rfl: 0 .  Ibuprofen-diphenhydrAMINE Cit (ADVIL PM) 200-38 MG TABS, Take 1 tablet by mouth as needed., Disp: , Rfl:  .  isosorbide mononitrate (IMDUR) 60 MG 24 hr tablet, take 2 tablets by mouth once daily (Patient taking differently: take 2 tablets(120mg ) by mouth once daily), Disp: 60 tablet, Rfl: 5 .  lisinopril (PRINIVIL,ZESTRIL) 20 MG tablet, Take 1 tablet (20 mg total) by mouth daily., Disp: 90 tablet, Rfl: 1 .  methimazole (TAPAZOLE) 10 MG tablet, Take 4 tablets (40 mg total) by mouth 2 (two) times daily., Disp: 90 tablet, Rfl: 0 .  nitroGLYCERIN (NITROSTAT) 0.4 MG SL tablet, PLACE 1 TABLET UNDER THE TONGUE EVERY 5 MINUTES FOR CHEST PAIN FOR 3 DOSES, Disp: 25 tablet, Rfl: 1 .  oxybutynin (DITROPAN-XL) 10 MG 24 hr tablet, Take 1 tablet (10 mg total) by mouth daily., Disp: 90 tablet, Rfl: 3 .  potassium chloride (KLOR-CON 10) 10 MEQ tablet, Take 1 tablet (10 mEq total) by mouth 2 (two) times daily., Disp: 60 tablet, Rfl: 5 .  propranolol (INDERAL) 60 MG tablet, Take 1 tablet (60 mg total) by mouth 2 (two) times daily., Disp: 60 tablet, Rfl: 0 .  traMADol (ULTRAM) 50 MG tablet, Take 1-2 tablets (50-100 mg total) by mouth every 6 (six) hours as needed for moderate pain., Disp: 30 tablet, Rfl: 0 .  venlafaxine XR (EFFEXOR-XR) 150 MG 24 hr capsule, TAKE 1 CAPSULE BY MOUTH AT BEDTIME, Disp: 30 capsule, Rfl: 5 .   vitamin C (ASCORBIC ACID) 500 MG tablet, Take 500 mg daily by mouth. , Disp: , Rfl:  .  vitamin E (VITAMIN E) 1000 UNIT capsule, Take 1,000 Units daily by mouth. , Disp: , Rfl:  .  aspirin EC 81 MG tablet, Take 81 mg daily by mouth., Disp: , Rfl:  .  Cyanocobalamin (VITAMIN B 12 PO), Take 1 capsule daily by mouth. , Disp: , Rfl:  .  Ferrous Sulfate Dried (EQ SLOW-RELEASE IRON) 45 MG TBCR, Take 1 tablet by mouth daily., Disp: 90 tablet, Rfl: 1 .  loperamide (IMODIUM) 2 MG capsule, Take 1 capsule (2 mg total) by mouth as needed for diarrhea or loose stools. (Patient not taking: Reported on 12/22/2017), Disp: 30 capsule, Rfl: 0   Allergies  Allergen Reactions  . Sulfa Drugs Cross Reactors Shortness Of Breath and Palpitations    Review of Systems   ROS  Negative unless otherwise specified per history of present illness   Vitals:   Vitals:   12/22/17 1138  BP: 140/90  Pulse: 67  Temp: 98.7 F (37.1 C)  TempSrc: Oral  SpO2: 98%  Weight: 133 lb 8 oz (60.6 kg)  Height: 5\' 6"  (1.676 m)     Body mass index is 21.55 kg/m.   Physical Exam:    Physical Exam  Constitutional: She appears well-developed. She is cooperative.  Non-toxic appearance. She does not have a sickly appearance. She does not appear ill. No distress.  Very thin body frame  Cardiovascular: Normal rate, regular rhythm, S1 normal, S2 normal, normal heart sounds and normal pulses.  No LE edema  Pulmonary/Chest: Effort normal and breath sounds normal.  Neurological: She is alert. GCS eye subscore is 4. GCS verbal subscore is 5. GCS motor subscore is 6.  Skin: Skin is warm, dry and intact.  Psychiatric: She has a normal mood and affect. Her speech is normal and behavior is normal.  Nursing note and vitals reviewed.   Assessment and  Plan:    Problem List Items Addressed This Visit      Cardiovascular and Mediastinum   Migraine    Declines pain contract today. Has been receiving tramadol from Dr. Tana Conch.  Most recent tramadol refill in May of last year. I did provide a short prescription of 30 tablets. Must get additional tablets from PCP. Discussed this with Dr. Earlene Plater.      Relevant Medications   aspirin 325 MG EC tablet   traMADol (ULTRAM) 50 MG tablet   propranolol (INDERAL) 60 MG tablet     Endocrine   Hyperthyroidism    We had a long discussion about the compliance with her medications. She reports that she has been taking her thyroid medications as prescribed. I will refill her propranolol today. I have only given her 60 pills, discussed that she needs to get in to see endocrinology ASAP. She reports that she has plenty of Tapazole. Patient verbalized understanding.      Relevant Medications   propranolol (INDERAL) 60 MG tablet     Other   Anemia - Primary    CBC from hospital reviewed with patient. She needs a repeat CBC today. I have sent in ferrous sulfate for her, per her request. Will adjust dosage based on level of anemia if needed. Encouraged compliance with medication.       Relevant Medications   Ferrous Sulfate Dried (EQ SLOW-RELEASE IRON) 45 MG TBCR   Other Relevant Orders   CBC with Differential/Platelet   Comprehensive metabolic panel       . Reviewed expectations re: course of current medical issues. . Discussed self-management of symptoms. . Outlined signs and symptoms indicating need for more acute intervention. . Patient verbalized understanding and all questions were answered. . See orders for this visit as documented in the electronic medical record. . Patient received an After Visit Summary.  CMA or LPN served as scribe during this visit. History, Physical, and Plan performed by medical provider. Documentation and orders reviewed and attested to.  Jarold Motto, PA-C [place] [place] 12/22/2017  Follow-up: No Follow-up on file.

## 2017-12-22 NOTE — Assessment & Plan Note (Signed)
We had a long discussion about the compliance with her medications. She reports that she has been taking her thyroid medications as prescribed. I will refill her propranolol today. I have only given her 60 pills, discussed that she needs to get in to see endocrinology ASAP. She reports that she has plenty of Tapazole. Patient verbalized understanding.

## 2017-12-22 NOTE — Patient Instructions (Signed)
PLEASE CALL DR. ELLISON'S OFFICE TODAY FOR A FOLLOW-UP ASAP!!!

## 2017-12-22 NOTE — Assessment & Plan Note (Signed)
CBC from hospital reviewed with patient. She needs a repeat CBC today. I have sent in ferrous sulfate for her, per her request. Will adjust dosage based on level of anemia if needed. Encouraged compliance with medication.

## 2017-12-23 ENCOUNTER — Other Ambulatory Visit: Payer: Self-pay | Admitting: Physician Assistant

## 2017-12-23 DIAGNOSIS — I10 Essential (primary) hypertension: Secondary | ICD-10-CM

## 2017-12-23 DIAGNOSIS — D649 Anemia, unspecified: Secondary | ICD-10-CM

## 2017-12-23 NOTE — Progress Notes (Signed)
cm

## 2018-01-05 ENCOUNTER — Encounter: Payer: Self-pay | Admitting: Endocrinology

## 2018-01-05 ENCOUNTER — Ambulatory Visit (INDEPENDENT_AMBULATORY_CARE_PROVIDER_SITE_OTHER): Payer: Medicare HMO | Admitting: Endocrinology

## 2018-01-05 VITALS — BP 138/82 | HR 72 | Wt 139.4 lb

## 2018-01-05 DIAGNOSIS — E0590 Thyrotoxicosis, unspecified without thyrotoxic crisis or storm: Secondary | ICD-10-CM | POA: Diagnosis not present

## 2018-01-05 LAB — TSH: TSH: 112.14 u[IU]/mL — ABNORMAL HIGH (ref 0.35–4.50)

## 2018-01-05 NOTE — Progress Notes (Signed)
Subjective:    Patient ID: [name], female    DOB: 09-23-1946, 72 y.o.   MRN: [id number]  HPI Pt returns for f/u of hyperthyroidism (dx'ed 2012; [place] was c/w Grave's Dz; tapazole was chosen as rx, due to 2018 MI).  She takes tapazole as rx'ed.  pt states she feels well in general.  In particular, she denies tremor and palpitations.  Past Medical History:  Diagnosis Date  . Arthritis   . Chronic lower back pain   . Cluster headache   . Coronary artery disease    a. 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 non obs dz LHC 2007. c. Non obs dz (mild-mod) by [facility] 05/17/14 d. cath 05/26/17 mild non obstructive CAD  . Diverticulitis    a. Hx microperf 2012 - hospitalizated.  Marland Kitchen GERD (gastroesophageal reflux disease)   . Hypercholesteremia   . Hypertension   . PVC's (premature ventricular contractions)    a. Hx of trigeminy/bigeminy.  . Seizures (HCC)   . Thyroid disease    hyperthyroid  . UTI (lower urinary tract infection)     Past Surgical History:  Procedure Laterality Date  . ABDOMINAL HYSTERECTOMY  1987   "partial"-still has ovaries  . CARDIAC CATHETERIZATION  05/17/2014  . CORONARY ANGIOPLASTY WITH STENT PLACEMENT  1995   "1"  . JOINT REPLACEMENT     right knee  . LEFT HEART CATH AND CORONARY ANGIOGRAPHY N/A 05/26/2017   Procedure: Left Heart Cath and Coronary Angiography;  Surgeon: Corky Crafts, MD;  Location: [facility] INVASIVE CV LAB;  Service: Cardiovascular;  Laterality: N/A;  . LEFT HEART CATHETERIZATION WITH CORONARY ANGIOGRAM N/A 05/17/2014   Procedure: LEFT HEART CATHETERIZATION WITH CORONARY ANGIOGRAM;  Surgeon: Marykay Lex, MD;  Location: [facility] CATH LAB;  Service: Cardiovascular;  Laterality: N/A;  . TOTAL KNEE ARTHROPLASTY Right 2005  . TUBAL LIGATION  1970  . VASCULAR SURGERY      Social History   Socioeconomic History  . Marital status: Married    Spouse name: Not on file  . Number of children: 4  . Years of education: 4  . Highest education level: Not on file  Social  Needs  . Financial resource strain: Not on file  . Food insecurity - worry: Not on file  . Food insecurity - inability: Not on file  . Transportation needs - medical: Not on file  . Transportation needs - non-medical: Not on file  Occupational History    Comment: retired  Tobacco Use  . Smoking status: Never Smoker  . Smokeless tobacco: Never Used  Substance and Sexual Activity  . Alcohol use: No  . Drug use: No  . Sexual activity: No  Other Topics Concern  . Not on file  Social History Narrative   Separated. 4 children from first marriage. 16 grandkids.       Retired from [employer] for 25 years, went to [employer] for 5 years. Retired 2003 after MI      Hobbies: babysit/active with children      Right-handed      Caffeine: 24 oz soda per day       Current Outpatient Medications on File Prior to Visit  Medication Sig Dispense Refill  . acetaminophen (TYLENOL) 325 MG tablet Take 650 mg by mouth every 6 (six) hours as needed for moderate pain.     Marland Kitchen amLODipine (NORVASC) 10 MG tablet take 1 tablet by mouth once daily (Patient taking differently: take 1 tablet(10mg ) by mouth once daily) 30 tablet  5  . aspirin 325 MG EC tablet Take 325 mg by mouth every other day.    Marland Kitchen aspirin EC 81 MG tablet Take 81 mg daily by mouth.    Marland Kitchen atorvastatin (LIPITOR) 20 MG tablet take 1 tablet (20mg ) by mouth once daily 90 tablet 0  . B Complex Vitamins (VITAMIN B COMPLEX) TABS Take 1 tablet by mouth daily.    . cholecalciferol (VITAMIN D) 400 UNITS TABS tablet Take 400 Units daily by mouth.     . Cyanocobalamin (VITAMIN B 12 PO) Take 1 capsule daily by mouth.     . esomeprazole (NEXIUM) 40 MG capsule Take 1 capsule (40 mg total) by mouth daily. 90 capsule 3  . ezetimibe (ZETIA) 10 MG tablet take 1 tablet by mouth once daily (Patient taking differently: take 1 tablet(10mg ) by mouth once daily) 30 tablet 5  . feeding supplement, ENSURE ENLIVE, (ENSURE ENLIVE) LIQD Take 237 mLs by mouth 2 (two) times daily  between meals. 237 mL 12  . ferrous sulfate 325 (65 FE) MG tablet Take 1 tablet (325 mg total) by mouth daily. 30 tablet 0  . Ferrous Sulfate Dried (EQ SLOW-RELEASE IRON) 45 MG TBCR Take 1 tablet by mouth daily. 90 tablet 1  . Ibuprofen-diphenhydrAMINE Cit (ADVIL PM) 200-38 MG TABS Take 1 tablet by mouth as needed.    . isosorbide mononitrate (IMDUR) 60 MG 24 hr tablet take 2 tablets by mouth once daily (Patient taking differently: take 2 tablets(120mg ) by mouth once daily) 60 tablet 5  . lisinopril (PRINIVIL,ZESTRIL) 20 MG tablet Take 1 tablet (20 mg total) by mouth daily. 90 tablet 1  . loperamide (IMODIUM) 2 MG capsule Take 1 capsule (2 mg total) by mouth as needed for diarrhea or loose stools. 30 capsule 0  . nitroGLYCERIN (NITROSTAT) 0.4 MG SL tablet PLACE 1 TABLET UNDER THE TONGUE EVERY 5 MINUTES FOR CHEST PAIN FOR 3 DOSES 25 tablet 1  . oxybutynin (DITROPAN-XL) 10 MG 24 hr tablet Take 1 tablet (10 mg total) by mouth daily. 90 tablet 3  . potassium chloride (KLOR-CON 10) 10 MEQ tablet Take 1 tablet (10 mEq total) by mouth 2 (two) times daily. 60 tablet 5  . propranolol (INDERAL) 60 MG tablet Take 1 tablet (60 mg total) by mouth 2 (two) times daily. 60 tablet 0  . traMADol (ULTRAM) 50 MG tablet Take 1-2 tablets (50-100 mg total) by mouth every 6 (six) hours as needed for moderate pain. 30 tablet 0  . venlafaxine XR (EFFEXOR-XR) 150 MG 24 hr capsule TAKE 1 CAPSULE BY MOUTH AT BEDTIME 30 capsule 5  . vitamin C (ASCORBIC ACID) 500 MG tablet Take 500 mg daily by mouth.     . vitamin E (VITAMIN E) 1000 UNIT capsule Take 1,000 Units daily by mouth.      No current facility-administered medications on file prior to visit.     Allergies  Allergen Reactions  . Sulfa Drugs Cross Reactors Shortness Of Breath and Palpitations    Family History  Problem Relation Age of Onset  . CAD Brother   . Diabetes Brother   . CAD Father   . Diabetes Father   . Diabetes Mother        father, sister,  brothers  . Diabetes Sister   . Thyroid disease Sister     BP 138/82 (BP Location: Left Arm, Patient Position: Sitting, Cuff Size: Normal)   Pulse 72   Wt 139 lb 6.4 oz (63.2 kg)   SpO2 97%  BMI 22.50 kg/m    Review of Systems Denies fever.     Objective:   Physical Exam VS: see vs page.   GEN: no distress.  NECK: multinodular goiter is again noted (R>L).        Assessment & Plan:  Hyperthyroidism: due for recheck.   Patient Instructions  blood tests are requested for you today.  We'll let you know about the results.   If you miss the pills, you can double up the next time.   Please come back for a follow-up appointment in 2 months.

## 2018-01-05 NOTE — Patient Instructions (Addendum)
blood tests are requested for you today.  We'll let you know about the results.   If you miss the pills, you can double up the next time.   Please come back for a follow-up appointment in 2 months.

## 2018-01-06 ENCOUNTER — Other Ambulatory Visit (INDEPENDENT_AMBULATORY_CARE_PROVIDER_SITE_OTHER): Payer: Medicare HMO

## 2018-01-06 DIAGNOSIS — I10 Essential (primary) hypertension: Secondary | ICD-10-CM

## 2018-01-06 DIAGNOSIS — D649 Anemia, unspecified: Secondary | ICD-10-CM

## 2018-01-06 LAB — COMPREHENSIVE METABOLIC PANEL
ALT: 24 U/L (ref 0–35)
AST: 23 U/L (ref 0–37)
Albumin: 4.2 g/dL (ref 3.5–5.2)
Alkaline Phosphatase: 75 U/L (ref 39–117)
BILIRUBIN TOTAL: 0.6 mg/dL (ref 0.2–1.2)
BUN: 10 mg/dL (ref 6–23)
CHLORIDE: 105 meq/L (ref 96–112)
CO2: 30 mEq/L (ref 19–32)
CREATININE: 1.18 mg/dL (ref 0.40–1.20)
Calcium: 9.8 mg/dL (ref 8.4–10.5)
GFR: 58.04 mL/min — ABNORMAL LOW (ref >60.00)
Glucose, Bld: 85 mg/dL (ref 70–99)
Potassium: 4 mEq/L (ref 3.5–5.1)
SODIUM: 140 meq/L (ref 135–145)
TOTAL PROTEIN: 7.4 g/dL (ref 6.0–8.3)

## 2018-01-06 LAB — T4, FREE: Free T4: 0.06 ng/dL — ABNORMAL LOW (ref 0.60–1.60)

## 2018-01-10 ENCOUNTER — Other Ambulatory Visit: Payer: Self-pay | Admitting: Family Medicine

## 2018-01-17 ENCOUNTER — Other Ambulatory Visit: Payer: Self-pay | Admitting: Family Medicine

## 2018-01-19 ENCOUNTER — Ambulatory Visit: Payer: Medicare HMO | Admitting: Cardiology

## 2018-01-20 ENCOUNTER — Encounter: Payer: Self-pay | Admitting: Physician Assistant

## 2018-01-20 ENCOUNTER — Ambulatory Visit: Payer: Medicare HMO | Admitting: *Deleted

## 2018-01-20 ENCOUNTER — Ambulatory Visit (INDEPENDENT_AMBULATORY_CARE_PROVIDER_SITE_OTHER): Payer: Medicare HMO | Admitting: Physician Assistant

## 2018-01-20 ENCOUNTER — Other Ambulatory Visit: Payer: Self-pay | Admitting: Endocrinology

## 2018-01-20 ENCOUNTER — Ambulatory Visit (INDEPENDENT_AMBULATORY_CARE_PROVIDER_SITE_OTHER): Payer: Medicare HMO

## 2018-01-20 ENCOUNTER — Telehealth: Payer: Self-pay | Admitting: *Deleted

## 2018-01-20 VITALS — BP 148/80 | HR 81 | Temp 98.5°F

## 2018-01-20 DIAGNOSIS — G8929 Other chronic pain: Secondary | ICD-10-CM

## 2018-01-20 DIAGNOSIS — R809 Proteinuria, unspecified: Secondary | ICD-10-CM | POA: Diagnosis not present

## 2018-01-20 DIAGNOSIS — Z87440 Personal history of urinary (tract) infections: Secondary | ICD-10-CM | POA: Diagnosis not present

## 2018-01-20 DIAGNOSIS — R5383 Other fatigue: Secondary | ICD-10-CM | POA: Diagnosis not present

## 2018-01-20 DIAGNOSIS — M545 Low back pain: Secondary | ICD-10-CM | POA: Diagnosis not present

## 2018-01-20 DIAGNOSIS — R300 Dysuria: Secondary | ICD-10-CM

## 2018-01-20 DIAGNOSIS — E0590 Thyrotoxicosis, unspecified without thyrotoxic crisis or storm: Secondary | ICD-10-CM

## 2018-01-20 DIAGNOSIS — D649 Anemia, unspecified: Secondary | ICD-10-CM

## 2018-01-20 DIAGNOSIS — M5136 Other intervertebral disc degeneration, lumbar region: Secondary | ICD-10-CM | POA: Diagnosis not present

## 2018-01-20 LAB — COMPREHENSIVE METABOLIC PANEL
ALT: 26 U/L (ref 0–35)
AST: 20 U/L (ref 0–37)
Albumin: 3.8 g/dL (ref 3.5–5.2)
Alkaline Phosphatase: 81 U/L (ref 39–117)
BUN: 13 mg/dL (ref 6–23)
CALCIUM: 9.4 mg/dL (ref 8.4–10.5)
CHLORIDE: 108 meq/L (ref 96–112)
CO2: 29 mEq/L (ref 19–32)
Creatinine, Ser: 0.83 mg/dL (ref 0.40–1.20)
GFR: 87.1 mL/min (ref >60.00)
Glucose, Bld: 106 mg/dL — ABNORMAL HIGH (ref 70–99)
POTASSIUM: 4.2 meq/L (ref 3.5–5.1)
SODIUM: 141 meq/L (ref 135–145)
Total Bilirubin: 1 mg/dL (ref 0.2–1.2)
Total Protein: 6.9 g/dL (ref 6.0–8.3)

## 2018-01-20 LAB — POCT URINALYSIS DIPSTICK
Bilirubin, UA: NEGATIVE
Blood, UA: NEGATIVE
GLUCOSE UA: NEGATIVE
Ketones, UA: NEGATIVE
LEUKOCYTES UA: NEGATIVE
Nitrite, UA: NEGATIVE
Spec Grav, UA: 1.025 (ref 1.010–1.025)
Urobilinogen, UA: 1 E.U./dL
pH, UA: 5.5 (ref 5.0–8.0)

## 2018-01-20 LAB — CBC WITH DIFFERENTIAL/PLATELET
BASOS PCT: 1.2 % (ref 0.0–3.0)
Basophils Absolute: 0.1 10*3/uL (ref 0.0–0.1)
EOS PCT: 9.4 % — AB (ref 0.0–5.0)
Eosinophils Absolute: 0.4 10*3/uL (ref 0.0–0.7)
HEMATOCRIT: 31 % — AB (ref 36.0–46.0)
HEMOGLOBIN: 10.2 g/dL — AB (ref 12.0–15.0)
Lymphocytes Relative: 30.7 % (ref 12.0–46.0)
Lymphs Abs: 1.5 10*3/uL (ref 0.7–4.0)
MCHC: 32.8 g/dL (ref 30.0–36.0)
MCV: 98.8 fl (ref 78.0–100.0)
MONO ABS: 0.5 10*3/uL (ref 0.1–1.0)
Monocytes Relative: 10.7 % (ref 3.0–12.0)
Neutro Abs: 2.3 10*3/uL (ref 1.4–7.7)
Neutrophils Relative %: 48 % (ref 43.0–77.0)
Platelets: 197 10*3/uL (ref 150.0–400.0)
RBC: 3.14 Mil/uL — ABNORMAL LOW (ref 3.87–5.11)
RDW: 17.7 % — AB (ref 11.5–15.5)
WBC: 4.8 10*3/uL (ref 4.0–10.5)

## 2018-01-20 LAB — T4, FREE: FREE T4: 1.09 ng/dL (ref 0.60–1.60)

## 2018-01-20 LAB — TSH: TSH: 0.13 u[IU]/mL — ABNORMAL LOW (ref 0.35–4.50)

## 2018-01-20 MED ORDER — METHIMAZOLE 10 MG PO TABS: 10.0000 mg | ORAL_TABLET | Freq: Two times a day (BID) | ORAL | 5 refills | Status: DC

## 2018-01-20 NOTE — Telephone Encounter (Signed)
Pt requesting refill for Imdur 60 mg.

## 2018-01-20 NOTE — Patient Instructions (Signed)
It was great to see you!  Please follow-up with Dr. Durene CalHunter for your back pain and fatigue if it persists. We will be in touch with you regarding your lab results. Dr. Everardo AllEllison should be in touch with you as well.  I would also like for you to consider physical therapy.

## 2018-01-20 NOTE — Progress Notes (Addendum)
[name] is a 72 y.o. female here for a follow up of a pre-existing problem.  I acted as a Neurosurgeon for [employer], PA-C Corky Mull, LPN  History of Present Illness:   Chief Complaint  Patient presents with  . Back Pain  . Fatigue    Back Pain  This is a recurrent problem. Episode onset: Pt states is worse past 2 months. The problem occurs constantly. The problem has been gradually worsening since onset. The pain is present in the lumbar spine. The quality of the pain is described as aching. The pain does not radiate. The pain is at a severity of 8/10. The pain is moderate. The pain is the same all the time. Stiffness is present all day. Associated symptoms include headaches. Treatments tried: Tylenol and Tramadol. The treatment provided moderate relief.   Last BM this morning. No fevers. Only has two Tramadol left - would like a refill on this today, she also takes this for headaches. She admits that she has had ongoing discussions with her PCP about continued use of this. Does have a small amount of burning with urination -- one or two times, but no frequency. Denies numbness or tingling, weakness in either extremity.  Fatigue Noticed this for the last month. Eating more. Up to 139 lb today, was 133 lb. Drinking two Ensure daily. Saw Dr. Everardo All about two weeks ago and took her off the tapazole, was told to re-check her labs in two weeks. Denies excess blood loss, fevers, unusual sleeping patterns, SOB, CP. She does have a history of anemia, but CBC last checked 1 month ago and her Hgb increased from 9.3 to 12.8.  Wt Readings from Last 3 Encounters:  01/05/18 139 lb 6.4 oz (63.2 kg)  12/22/17 133 lb 8 oz (60.6 kg)  10/04/17 131 lb 8 oz (59.6 kg)      Past Medical History:  Diagnosis Date  . Arthritis   . Chronic lower back pain   . Cluster headache   . Coronary artery disease    a. 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 non obs dz LHC 2007. c. Non obs dz (mild-mod) by [facility] 05/17/14 d.  cath 05/26/17 mild non obstructive CAD  . Diverticulitis    a. Hx microperf 2012 - hospitalizated.  Marland Kitchen GERD (gastroesophageal reflux disease)   . Hypercholesteremia   . Hypertension   . PVC's (premature ventricular contractions)    a. Hx of trigeminy/bigeminy.  . Seizures (HCC)   . Thyroid disease    hyperthyroid  . UTI (lower urinary tract infection)      Social History   Socioeconomic History  . Marital status: Married    Spouse name: Not on file  . Number of children: 4  . Years of education: 4  . Highest education level: Not on file  Occupational History    Comment: retired  Engineer, production  . Financial resource strain: Not on file  . Food insecurity:    Worry: Not on file    Inability: Not on file  . Transportation needs:    Medical: Not on file    Non-medical: Not on file  Tobacco Use  . Smoking status: Never Smoker  . Smokeless tobacco: Never Used  Substance and Sexual Activity  . Alcohol use: No  . Drug use: No  . Sexual activity: Never  Lifestyle  . Physical activity:    Days per week: Not on file    Minutes per session: Not on file  . Stress:  Not on file  Relationships  . Social connections:    Talks on phone: Not on file    Gets together: Not on file    Attends religious service: Not on file    Active member of club or organization: Not on file    Attends meetings of clubs or organizations: Not on file    Relationship status: Not on file  . Intimate partner violence:    Fear of current or ex partner: Not on file    Emotionally abused: Not on file    Physically abused: Not on file    Forced sexual activity: Not on file  Other Topics Concern  . Not on file  Social History Narrative   Separated. 4 children from first marriage. 16 grandkids.       Retired from [employer] for 25 years, went to [employer] for 5 years. Retired 2003 after MI      Hobbies: babysit/active with children      Right-handed      Caffeine: 24 oz soda per day       Past Surgical  History:  Procedure Laterality Date  . ABDOMINAL HYSTERECTOMY  1987   "partial"-still has ovaries  . CARDIAC CATHETERIZATION  05/17/2014  . CORONARY ANGIOPLASTY WITH STENT PLACEMENT  1995   "1"  . JOINT REPLACEMENT     right knee  . LEFT HEART CATH AND CORONARY ANGIOGRAPHY N/A 05/26/2017   Procedure: Left Heart Cath and Coronary Angiography;  Surgeon: Corky Crafts, MD;  Location: [facility] INVASIVE CV LAB;  Service: Cardiovascular;  Laterality: N/A;  . LEFT HEART CATHETERIZATION WITH CORONARY ANGIOGRAM N/A 05/17/2014   Procedure: LEFT HEART CATHETERIZATION WITH CORONARY ANGIOGRAM;  Surgeon: Marykay Lex, MD;  Location: [facility] CATH LAB;  Service: Cardiovascular;  Laterality: N/A;  . TOTAL KNEE ARTHROPLASTY Right 2005  . TUBAL LIGATION  1970  . VASCULAR SURGERY      Family History  Problem Relation Age of Onset  . CAD Brother   . Diabetes Brother   . CAD Father   . Diabetes Father   . Diabetes Mother        father, sister, brothers  . Diabetes Sister   . Thyroid disease Sister     Allergies  Allergen Reactions  . Sulfa Drugs Cross Reactors Shortness Of Breath and Palpitations    Current Medications:   Current Outpatient Medications:  .  acetaminophen (TYLENOL) 325 MG tablet, Take 650 mg by mouth every 6 (six) hours as needed for moderate pain. , Disp: , Rfl:  .  amLODipine (NORVASC) 10 MG tablet, TAKE 1 TABLET BY MOUTH ONCE DAILY, Disp: 30 tablet, Rfl: 5 .  aspirin 325 MG EC tablet, Take 325 mg by mouth every other day., Disp: , Rfl:  .  aspirin EC 81 MG tablet, Take 81 mg daily by mouth., Disp: , Rfl:  .  atorvastatin (LIPITOR) 20 MG tablet, take 1 tablet (20mg ) by mouth once daily, Disp: 90 tablet, Rfl: 0 .  B Complex Vitamins (VITAMIN B COMPLEX) TABS, Take 1 tablet by mouth daily., Disp: , Rfl:  .  cholecalciferol (VITAMIN D) 400 UNITS TABS tablet, Take 400 Units daily by mouth. , Disp: , Rfl:  .  Cyanocobalamin (VITAMIN B 12 PO), Take 1 capsule daily by mouth. , Disp: ,  Rfl:  .  esomeprazole (NEXIUM) 40 MG capsule, Take 1 capsule (40 mg total) by mouth daily., Disp: 90 capsule, Rfl: 3 .  ezetimibe (ZETIA) 10 MG tablet, TAKE 1  TABLET BY MOUTH ONCE DAILY, Disp: 90 tablet, Rfl: 0 .  feeding supplement, ENSURE ENLIVE, (ENSURE ENLIVE) LIQD, Take 237 mLs by mouth 2 (two) times daily between meals., Disp: 237 mL, Rfl: 12 .  Ferrous Sulfate Dried (EQ SLOW-RELEASE IRON) 45 MG TBCR, Take 1 tablet by mouth daily., Disp: 90 tablet, Rfl: 1 .  Ibuprofen-diphenhydrAMINE Cit (ADVIL PM) 200-38 MG TABS, Take 1 tablet by mouth as needed., Disp: , Rfl:  .  lisinopril (PRINIVIL,ZESTRIL) 20 MG tablet, Take 1 tablet (20 mg total) by mouth daily., Disp: 90 tablet, Rfl: 1 .  loperamide (IMODIUM) 2 MG capsule, Take 1 capsule (2 mg total) by mouth as needed for diarrhea or loose stools., Disp: 30 capsule, Rfl: 0 .  nitroGLYCERIN (NITROSTAT) 0.4 MG SL tablet, PLACE 1 TABLET UNDER THE TONGUE EVERY 5 MINUTES FOR CHEST PAIN FOR 3 DOSES, Disp: 25 tablet, Rfl: 1 .  oxybutynin (DITROPAN-XL) 10 MG 24 hr tablet, Take 1 tablet (10 mg total) by mouth daily., Disp: 90 tablet, Rfl: 3 .  potassium chloride (KLOR-CON 10) 10 MEQ tablet, Take 1 tablet (10 mEq total) by mouth 2 (two) times daily., Disp: 60 tablet, Rfl: 5 .  propranolol (INDERAL) 60 MG tablet, Take 1 tablet (60 mg total) by mouth 2 (two) times daily., Disp: 60 tablet, Rfl: 0 .  traMADol (ULTRAM) 50 MG tablet, Take 1-2 tablets (50-100 mg total) by mouth every 6 (six) hours as needed for moderate pain., Disp: 30 tablet, Rfl: 0 .  venlafaxine XR (EFFEXOR-XR) 150 MG 24 hr capsule, TAKE 1 CAPSULE BY MOUTH AT BEDTIME, Disp: 30 capsule, Rfl: 5 .  vitamin C (ASCORBIC ACID) 500 MG tablet, Take 500 mg daily by mouth. , Disp: , Rfl:  .  vitamin E (VITAMIN E) 1000 UNIT capsule, Take 1,000 Units daily by mouth. , Disp: , Rfl:  .  isosorbide mononitrate (IMDUR) 60 MG 24 hr tablet, TAKE 2 TABLETS BY MOUTH ONCE DAILY (Patient not taking: Reported on  01/20/2018), Disp: 180 tablet, Rfl: 0 .  methimazole (TAPAZOLE) 10 MG tablet, Take 1 tablet (10 mg total) by mouth 2 (two) times daily., Disp: 60 tablet, Rfl: 5   Review of Systems:   Review of Systems  Musculoskeletal: Positive for back pain.  Neurological: Positive for headaches.  Negative unless otherwise specified per HPI.   Vitals:   Vitals:   01/20/18 1128  BP: (!) 148/80  Pulse: 81  Temp: 98.5 F (36.9 C)  TempSrc: Oral  SpO2: 94%     There is no height or weight on file to calculate BMI.  Physical Exam:   Physical Exam  Constitutional: She appears well-developed. She is cooperative.  Non-toxic appearance. She does not have a sickly appearance. She does not appear ill. No distress.  HENT:  Head: Normocephalic and atraumatic.  Nose: Nose normal.  Eyes: Conjunctivae and lids are normal.  Neck: Trachea normal.  Cardiovascular: Normal rate, regular rhythm, S1 normal, S2 normal, normal heart sounds and normal pulses.  No LE edema  Pulmonary/Chest: Effort normal and breath sounds normal. She has no decreased breath sounds. She has no wheezes. She has no rhonchi. She has no rales.  Abdominal: There is no CVA tenderness.  Musculoskeletal:  No decreased ROM 2/2 pain with flexion/extension, lateral side bends, or rotation. Reproducible tenderness with deep palpation to bilateral paraspinal muscles in lower lumbar spine. No bony tenderness. No evidence of erythema, rash or ecchymosis.   Lymphadenopathy:    She has no cervical adenopathy.  Neurological:  She is alert. GCS eye subscore is 4. GCS verbal subscore is 5. GCS motor subscore is 6.  Skin: Skin is warm, dry and intact.  Psychiatric: She has a normal mood and affect. Her speech is normal and behavior is normal.  Nursing note and vitals reviewed.  CLINICAL DATA:  Low back pain for 2 months, no injury  EXAM: LUMBAR SPINE - 2-3 VIEW  COMPARISON:  None.  FINDINGS: The lumbar vertebrae are in normal alignment.  There is degenerative disc disease at L5-S1 where there is loss of disc space, sclerosis, spurring, and vacuum disc phenomenon present. The remainder of intervertebral disc spaces appear normal. No compression deformity is seen. The SI joints are well corticated.  IMPRESSION: Normal alignment.  Degenerative disc disease at L5-S1.   Electronically Signed   By: Dwyane Dee M.D.   On: 01/20/2018 14:38  Results for orders placed or performed in visit on 01/20/18  CBC with Differential/Platelet  Result Value Ref Range   WBC 4.8 4.0 - 10.5 K/uL   RBC 3.14 (L) 3.87 - 5.11 Mil/uL   Hemoglobin 10.2 (L) 12.0 - 15.0 g/dL   HCT 16.1 (L) 09.6 - 04.5 %   MCV 98.8 78.0 - 100.0 fl   MCHC 32.8 30.0 - 36.0 g/dL   RDW 40.9 (H) 81.1 - 91.4 %   Platelets 197.0 150.0 - 400.0 K/uL   Neutrophils Relative % 48.0 43.0 - 77.0 %   Lymphocytes Relative 30.7 12.0 - 46.0 %   Monocytes Relative 10.7 3.0 - 12.0 %   Eosinophils Relative 9.4 (H) 0.0 - 5.0 %   Basophils Relative 1.2 0.0 - 3.0 %   Neutro Abs 2.3 1.4 - 7.7 K/uL   Lymphs Abs 1.5 0.7 - 4.0 K/uL   Monocytes Absolute 0.5 0.1 - 1.0 K/uL   Eosinophils Absolute 0.4 0.0 - 0.7 K/uL   Basophils Absolute 0.1 0.0 - 0.1 K/uL  Comprehensive metabolic panel  Result Value Ref Range   Sodium 141 135 - 145 mEq/L   Potassium 4.2 3.5 - 5.1 mEq/L   Chloride 108 96 - 112 mEq/L   CO2 29 19 - 32 mEq/L   Glucose, Bld 106 (H) 70 - 99 mg/dL   BUN 13 6 - 23 mg/dL   Creatinine, Ser 7.82 0.40 - 1.20 mg/dL   Total Bilirubin 1.0 0.2 - 1.2 mg/dL   Alkaline Phosphatase 81 39 - 117 U/L   AST 20 0 - 37 U/L   ALT 26 0 - 35 U/L   Total Protein 6.9 6.0 - 8.3 g/dL   Albumin 3.8 3.5 - 5.2 g/dL   Calcium 9.4 8.4 - 95.6 mg/dL   GFR 21.30 >86.57 mL/min  TSH  Result Value Ref Range   TSH 0.13 (L) 0.35 - 4.50 uIU/mL  T4, free  Result Value Ref Range   Free T4 1.09 0.60 - 1.60 ng/dL  POCT urinalysis dipstick  Result Value Ref Range   Color, UA Yellow    Clarity, UA  Clear    Glucose, UA Negative    Bilirubin, UA Negative    Ketones, UA Negative    Spec Grav, UA 1.025 1.010 - 1.025   Blood, UA Negative    pH, UA 5.5 5.0 - 8.0   Protein, UA Trace    Urobilinogen, UA 1.0 0.2 or 1.0 E.U./dL   Nitrite, UA Negative    Leukocytes, UA Negative Negative   Appearance     Odor       Assessment and  Plan:    Georga HackingCary was seen today for back pain and fatigue.  Diagnoses and all orders for this visit:  Anemia, unspecified type Hgb decreased again today, down to 10.2. Will request she take her iron oral supplementation daily. Follow-up CBC in 1 month. -     CBC with Differential/Platelet  Chronic bilateral low back pain without sciatica; Dysuria UA neg. Culture pending. Back xray with evidence of degenerative disc at L5-S1. I briefly discussed plan of care with patient's PCP, Dr. Durene CalHunter. She is not willing to sign pain contract for Tramadol today, so I explained to her that her PCP will no longer provide refills of this medication. Patient verbalized understanding. May continue tylenol or ibuprofen. Offered PT referral. -     POCT urinalysis dipstick -     Urine Culture -     DG Lumbar Spine 2-3 Views  Fatigue, unspecified type Hgb again decreased (see "Anemia.") Additionally TSH is starting to dip down again, now at 0.13, Dr. Romero BellingSean Ellison has resumed patient's methimazole at 10mg  BID as of yesterday. Continue to encourage appropriate compliance with patient. -     CBC with Differential/Platelet -     Comprehensive metabolic panel  Hyperthyroidism Mgmt per Dr. Romero BellingSean Ellison. -     TSH -     T4, free   . Reviewed expectations re: course of current medical issues. . Discussed self-management of symptoms. . Outlined signs and symptoms indicating need for more acute intervention. . Patient verbalized understanding and all questions were answered. . See orders for this visit as documented in the electronic medical record. . Patient received an After-Visit  Summary.  CMA or LPN served as scribe during this visit. History, Physical, and Plan performed by medical provider. Documentation and orders reviewed and attested to.  Jarold MottoSamantha Derinda Bartus, PA-C

## 2018-01-20 NOTE — Telephone Encounter (Signed)
90 day supply was sent to pharmacy on 01/11/18

## 2018-01-21 ENCOUNTER — Other Ambulatory Visit: Payer: Self-pay | Admitting: *Deleted

## 2018-01-21 ENCOUNTER — Other Ambulatory Visit: Payer: Medicare HMO

## 2018-01-21 ENCOUNTER — Encounter: Payer: Self-pay | Admitting: Physician Assistant

## 2018-01-21 LAB — URINE CULTURE
MICRO NUMBER: 90388428
Result:: NO GROWTH
SPECIMEN QUALITY: ADEQUATE

## 2018-01-21 MED ORDER — FERROUS SULFATE DRIED ER 45 MG PO TBCR: 1.0000 | EXTENDED_RELEASE_TABLET | Freq: Every day | ORAL | 1 refills | Status: DC

## 2018-01-23 ENCOUNTER — Other Ambulatory Visit: Payer: Self-pay | Admitting: Family Medicine

## 2018-01-24 ENCOUNTER — Ambulatory Visit: Payer: Medicare HMO | Admitting: Physician Assistant

## 2018-01-26 ENCOUNTER — Telehealth: Payer: Self-pay | Admitting: Family Medicine

## 2018-01-26 NOTE — Telephone Encounter (Signed)
The patient would like a call regarding her lab work.

## 2018-01-28 NOTE — Telephone Encounter (Signed)
Called and spoke to patient. She wanted the telephone number for Endocrinology as she has not heard anything from their office.

## 2018-02-04 ENCOUNTER — Other Ambulatory Visit: Payer: Self-pay | Admitting: Family Medicine

## 2018-02-10 ENCOUNTER — Other Ambulatory Visit: Payer: Self-pay | Admitting: Family Medicine

## 2018-02-21 DIAGNOSIS — G894 Chronic pain syndrome: Secondary | ICD-10-CM | POA: Diagnosis not present

## 2018-02-23 NOTE — Progress Notes (Deleted)
Subjective:   [name] is a 72 y.o. female who presents for Medicare Annual (Subsequent) preventive examination.  Reports health as Hx left heart cath 05/2017 stents in 95 [address]   Diet Chol/hdl 3 a1c 5.8 04/2017   Exercise  Health Maintenance Due  Topic Date Due  . MAMMOGRAM  10/29/1996  . COLONOSCOPY  10/29/1996  . DEXA SCAN  10/30/2011           Objective:     Vitals: There were no vitals taken for this visit.  There is no height or weight on file to calculate BMI.  Advanced Directives 09/30/2017 05/23/2017 05/22/2017 05/21/2017 01/31/2015 05/17/2014 10/01/2011  Does Patient Have a Medical Advance Directive? No No No No No Patient does not have advance directive;Patient would like information Patient does not have advance directive;Patient would not like information  Would patient like information on creating a medical advance directive? No - Patient declined No - Patient declined - Yes (ED - Information included in AVS) No - patient declined information Advance directive packet given -  Pre-existing out of facility DNR order (yellow form or pink MOST form) - - - - - No No    Tobacco Social History   Tobacco Use  Smoking Status Never Smoker  Smokeless Tobacco Never Used     Counseling given: Not Answered   Clinical Intake:    Past Medical History:  Diagnosis Date  . Arthritis   . Chronic lower back pain   . Cluster headache   . Coronary artery disease    a. 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 non obs dz LHC 2007. c. Non obs dz (mild-mod) by [facility] 05/17/14 d. cath 05/26/17 mild non obstructive CAD  . Diverticulitis    a. Hx microperf 2012 - hospitalizated.  Marland Kitchen GERD (gastroesophageal reflux disease)   . Hypercholesteremia   . Hypertension   . PVC's (premature ventricular contractions)    a. Hx of trigeminy/bigeminy.  . Seizures (HCC)   . Thyroid disease    hyperthyroid  . UTI (lower urinary tract infection)    Past Surgical History:  Procedure Laterality Date  .  ABDOMINAL HYSTERECTOMY  1987   "partial"-still has ovaries  . CARDIAC CATHETERIZATION  05/17/2014  . CORONARY ANGIOPLASTY WITH STENT PLACEMENT  1995   "1"  . JOINT REPLACEMENT     right knee  . LEFT HEART CATH AND CORONARY ANGIOGRAPHY N/A 05/26/2017   Procedure: Left Heart Cath and Coronary Angiography;  Surgeon: Corky Crafts, MD;  Location: [facility] INVASIVE CV LAB;  Service: Cardiovascular;  Laterality: N/A;  . LEFT HEART CATHETERIZATION WITH CORONARY ANGIOGRAM N/A 05/17/2014   Procedure: LEFT HEART CATHETERIZATION WITH CORONARY ANGIOGRAM;  Surgeon: Marykay Lex, MD;  Location: [facility] CATH LAB;  Service: Cardiovascular;  Laterality: N/A;  . TOTAL KNEE ARTHROPLASTY Right 2005  . TUBAL LIGATION  1970  . VASCULAR SURGERY     Family History  Problem Relation Age of Onset  . CAD Brother   . Diabetes Brother   . CAD Father   . Diabetes Father   . Diabetes Mother        father, sister, brothers  . Diabetes Sister   . Thyroid disease Sister    Social History   Socioeconomic History  . Marital status: Married    Spouse name: Not on file  . Number of children: 4  . Years of education: 4  . Highest education level: Not on file  Occupational History    Comment: retired  Social Needs  . Financial resource strain: Not on file  . Food insecurity:    Worry: Not on file    Inability: Not on file  . Transportation needs:    Medical: Not on file    Non-medical: Not on file  Tobacco Use  . Smoking status: Never Smoker  . Smokeless tobacco: Never Used  Substance and Sexual Activity  . Alcohol use: No  . Drug use: No  . Sexual activity: Never  Lifestyle  . Physical activity:    Days per week: Not on file    Minutes per session: Not on file  . Stress: Not on file  Relationships  . Social connections:    Talks on phone: Not on file    Gets together: Not on file    Attends religious service: Not on file    Active member of club or organization: Not on file    Attends meetings of  clubs or organizations: Not on file    Relationship status: Not on file  Other Topics Concern  . Not on file  Social History Narrative   Separated. 4 children from first marriage. 16 grandkids.       Retired from [employer] for 25 years, went to [employer] for 5 years. Retired 2003 after MI      Hobbies: babysit/active with children      Right-handed      Caffeine: 24 oz soda per day       Outpatient Encounter Medications as of 02/24/2018  Medication Sig  . acetaminophen (TYLENOL) 325 MG tablet Take 650 mg by mouth every 6 (six) hours as needed for moderate pain.   Marland Kitchen amLODipine (NORVASC) 10 MG tablet TAKE 1 TABLET BY MOUTH ONCE DAILY  . aspirin 325 MG EC tablet Take 325 mg by mouth every other day.  Marland Kitchen aspirin EC 81 MG tablet Take 81 mg daily by mouth.  Marland Kitchen atorvastatin (LIPITOR) 20 MG tablet TAKE 1 TABLET BY MOUTH ONCE DAILY  . B Complex Vitamins (VITAMIN B COMPLEX) TABS Take 1 tablet by mouth daily.  . cholecalciferol (VITAMIN D) 400 UNITS TABS tablet Take 400 Units daily by mouth.   . Cyanocobalamin (VITAMIN B 12 PO) Take 1 capsule daily by mouth.   . esomeprazole (NEXIUM) 40 MG capsule Take 1 capsule (40 mg total) by mouth daily.  Marland Kitchen ezetimibe (ZETIA) 10 MG tablet TAKE 1 TABLET BY MOUTH ONCE DAILY  . feeding supplement, ENSURE ENLIVE, (ENSURE ENLIVE) LIQD Take 237 mLs by mouth 2 (two) times daily between meals.  . Ferrous Sulfate Dried (EQ SLOW-RELEASE IRON) 45 MG TBCR Take 1 tablet by mouth daily.  . Ibuprofen-diphenhydrAMINE Cit (ADVIL PM) 200-38 MG TABS Take 1 tablet by mouth as needed.  . isosorbide mononitrate (IMDUR) 60 MG 24 hr tablet TAKE 2 TABLETS BY MOUTH ONCE DAILY  . lisinopril (PRINIVIL,ZESTRIL) 20 MG tablet Take 1 tablet (20 mg total) by mouth daily.  Marland Kitchen loperamide (IMODIUM) 2 MG capsule Take 1 capsule (2 mg total) by mouth as needed for diarrhea or loose stools.  . methimazole (TAPAZOLE) 10 MG tablet Take 1 tablet (10 mg total) by mouth 2 (two) times daily.  . nitroGLYCERIN  (NITROSTAT) 0.4 MG SL tablet PLACE 1 TABLET UNDER THE TONGUE EVERY 5 MINUTES FOR CHEST PAIN FOR 3 DOSES  . oxybutynin (DITROPAN-XL) 10 MG 24 hr tablet Take 1 tablet (10 mg total) by mouth daily.  . potassium chloride (KLOR-CON 10) 10 MEQ tablet Take 1 tablet (10 mEq total)  by mouth 2 (two) times daily.  . potassium chloride (MICRO-K) 10 MEQ CR capsule TAKE 1 CAPSULE BY MOUTH TWICE A DAY  . propranolol (INDERAL) 60 MG tablet Take 1 tablet (60 mg total) by mouth 2 (two) times daily.  . traMADol (ULTRAM) 50 MG tablet Take 1-2 tablets (50-100 mg total) by mouth every 6 (six) hours as needed for moderate pain.  Marland Kitchen venlafaxine XR (EFFEXOR-XR) 150 MG 24 hr capsule TAKE ONE CAPSULE BY MOUTH EVERY NIGHT AT BEDTIME  . vitamin C (ASCORBIC ACID) 500 MG tablet Take 500 mg daily by mouth.   . vitamin E (VITAMIN E) 1000 UNIT capsule Take 1,000 Units daily by mouth.    No facility-administered encounter medications on file as of 02/24/2018.     Activities of Daily Living In your present state of health, do you have any difficulty performing the following activities: 09/30/2017 05/23/2017  Hearing? N N  Vision? N N  Difficulty concentrating or making decisions? N N  Walking or climbing stairs? N N  Dressing or bathing? N N  Doing errands, shopping? N N  Some recent data might be hidden    Patient Care Team: Shelva Majestic, MD as PCP - General (Family Medicine)    Assessment:   This is a routine wellness examination for [place].  Exercise Activities and Dietary recommendations    Goals    None      Fall Risk Fall Risk  04/26/2017 08/17/2016 05/16/2015  Falls in the past year? No No No     Depression Screen PHQ 2/9 Scores 08/17/2016 05/16/2015  PHQ - 2 Score 0 0     Cognitive Function     Ad8 score reviewed for issues:  Issues making decisions:  Less interest in hobbies / activities:  Repeats questions, stories (family complaining):  Trouble using ordinary gadgets (microwave, computer,  phone):  Forgets the month or year:   Mismanaging finances:   Remembering appts:  Daily problems with thinking and/or memory: Ad8 score is=        Immunization History  Administered Date(s) Administered  . Influenza, High Dose Seasonal PF 08/17/2016, 08/06/2017  . Influenza,inj,Quad PF,6+ Mos 10/11/2015  . Pneumococcal Conjugate-13 10/11/2015  . Pneumococcal Polysaccharide-23 11/19/2016  . Td 12/25/1995    Qualifies for Shingles Vaccine?***  Screening Tests Health Maintenance  Topic Date Due  . MAMMOGRAM  10/29/1996  . COLONOSCOPY  10/29/1996  . DEXA SCAN  10/30/2011  . TETANUS/TDAP  12/22/2018 (Originally 12/24/2005)  . INFLUENZA VACCINE  05/26/2018  . Hepatitis C Screening  Completed  . PNA vac Low Risk Adult  Completed        Plan:     PCP Notes ***  Health Maintenance ***  Abnormal Screens  ***  Referrals  ***  Patient concerns; ***  Nurse Concerns; ***  Next PCP apt ***      I have personally reviewed and noted the following in the patient's chart:   . Medical and social history . Use of alcohol, tobacco or illicit drugs  . Current medications and supplements . Functional ability and status . Nutritional status . Physical activity . Advanced directives . List of other physicians . Hospitalizations, surgeries, and ER visits in previous 12 months . Vitals . Screenings to include cognitive, depression, and falls . Referrals and appointments  In addition, I have reviewed and discussed with patient certain preventive protocols, quality metrics, and best practice recommendations. A written personalized care plan for preventive services as well as general  preventive health recommendations were provided to patient.     Montine Circle, RN  02/23/2018

## 2018-02-24 ENCOUNTER — Ambulatory Visit: Payer: Medicare HMO | Admitting: *Deleted

## 2018-03-07 ENCOUNTER — Ambulatory Visit: Payer: Medicare HMO | Admitting: Endocrinology

## 2018-03-08 ENCOUNTER — Ambulatory Visit (INDEPENDENT_AMBULATORY_CARE_PROVIDER_SITE_OTHER): Payer: Managed Care, Other (non HMO) | Admitting: Endocrinology

## 2018-03-08 ENCOUNTER — Encounter: Payer: Self-pay | Admitting: Endocrinology

## 2018-03-08 VITALS — BP 120/62 | HR 72 | Ht 66.0 in | Wt 133.0 lb

## 2018-03-08 DIAGNOSIS — E0590 Thyrotoxicosis, unspecified without thyrotoxic crisis or storm: Secondary | ICD-10-CM

## 2018-03-08 NOTE — Patient Instructions (Addendum)
blood tests are requested for you today.  We'll let you know about the results.   If ever you have fever while taking methimazole, stop it and call us, even if the reason is obvious, because of the risk of a rare side-effect. If you miss the pills, you can double up the next time.   Please come back for a follow-up appointment in 2 months.   If you want to, we can do the radioactive iodine pill.  It works like this:  We would first check a thyroid "scan" (a special, but easy and painless type of thyroid x ray).  you go to the x-ray department of the hospital to swallow a pill, which contains a miniscule amount of radiation.  You will not notice any symptoms from this.  You will go back to the x-ray department the next day, to lie down in front of a camera.  The results of this will be sent to me.   Based on the results, i hope to order for you a treatment pill of radioactive iodine.  Although it is a larger amount of radiation, you will again notice no symptoms from this.  The pill is gone from your body in a few days (during which you should stay away from other people), but takes several months to work.  Therefore, please return here approximately 6-8 weeks after the treatment.  This treatment has been available for many years, and the only known side-effect is an underactive thyroid.  It is possible that i would eventually prescribe for you a thyroid hormone pill, which is very inexpensive.  You don't have to worry about side-effects of this thyroid hormone pill, because it is the same molecule your thyroid makes.

## 2018-03-08 NOTE — Progress Notes (Signed)
Subjective:    Patient ID: [name], female    DOB: [birth year], 72 y.o.   MRN: [id number]  HPI Pt returns for f/u of hyperthyroidism (dx'ed 2012; [place] was c/w Grave's Dz; tapazole was chosen as rx, due to 2018 MI).  1 month ago, she developed moderate palpitations in the chest, and assoc sob.  She then increased tapazole to 30 mg BID.   Past Medical History:  Diagnosis Date  . Arthritis   . Chronic lower back pain   . Cluster headache   . Coronary artery disease    a. 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 non obs dz LHC 2007. c. Non obs dz (mild-mod) by [facility] 05/17/14 d. cath 05/26/17 mild non obstructive CAD  . Diverticulitis    a. Hx microperf 2012 - hospitalizated.  Marland Kitchen GERD (gastroesophageal reflux disease)   . Hypercholesteremia   . Hypertension   . PVC's (premature ventricular contractions)    a. Hx of trigeminy/bigeminy.  . Seizures (HCC)   . Thyroid disease    hyperthyroid  . UTI (lower urinary tract infection)     Past Surgical History:  Procedure Laterality Date  . ABDOMINAL HYSTERECTOMY  1987   "partial"-still has ovaries  . CARDIAC CATHETERIZATION  05/17/2014  . CORONARY ANGIOPLASTY WITH STENT PLACEMENT  1995   "1"  . JOINT REPLACEMENT     right knee  . LEFT HEART CATH AND CORONARY ANGIOGRAPHY N/A 05/26/2017   Procedure: Left Heart Cath and Coronary Angiography;  Surgeon: Corky Crafts, MD;  Location: [facility] INVASIVE CV LAB;  Service: Cardiovascular;  Laterality: N/A;  . LEFT HEART CATHETERIZATION WITH CORONARY ANGIOGRAM N/A 05/17/2014   Procedure: LEFT HEART CATHETERIZATION WITH CORONARY ANGIOGRAM;  Surgeon: Marykay Lex, MD;  Location: [facility] CATH LAB;  Service: Cardiovascular;  Laterality: N/A;  . TOTAL KNEE ARTHROPLASTY Right 2005  . TUBAL LIGATION  1970  . VASCULAR SURGERY      Social History   Socioeconomic History  . Marital status: Married    Spouse name: Not on file  . Number of children: 4  . Years of education: 4  . Highest education level: Not on file    Occupational History    Comment: retired  Engineer, production  . Financial resource strain: Not on file  . Food insecurity:    Worry: Not on file    Inability: Not on file  . Transportation needs:    Medical: Not on file    Non-medical: Not on file  Tobacco Use  . Smoking status: Never Smoker  . Smokeless tobacco: Never Used  Substance and Sexual Activity  . Alcohol use: No  . Drug use: No  . Sexual activity: Never  Lifestyle  . Physical activity:    Days per week: Not on file    Minutes per session: Not on file  . Stress: Not on file  Relationships  . Social connections:    Talks on phone: Not on file    Gets together: Not on file    Attends religious service: Not on file    Active member of club or organization: Not on file    Attends meetings of clubs or organizations: Not on file    Relationship status: Not on file  . Intimate partner violence:    Fear of current or ex partner: Not on file    Emotionally abused: Not on file    Physically abused: Not on file    Forced sexual activity: Not on file  Other  Topics Concern  . Not on file  Social History Narrative   Separated. 4 children from first marriage. 16 grandkids.       Retired from [employer] for 25 years, went to [employer] for 5 years. Retired 2003 after MI      Hobbies: babysit/active with children      Right-handed      Caffeine: 24 oz soda per day       Current Outpatient Medications on File Prior to Visit  Medication Sig Dispense Refill  . acetaminophen (TYLENOL) 325 MG tablet Take 650 mg by mouth every 6 (six) hours as needed for moderate pain.     Marland Kitchen amLODipine (NORVASC) 10 MG tablet TAKE 1 TABLET BY MOUTH ONCE DAILY 30 tablet 5  . aspirin 325 MG EC tablet Take 325 mg by mouth every other day.    Marland Kitchen aspirin EC 81 MG tablet Take 81 mg daily by mouth.    Marland Kitchen atorvastatin (LIPITOR) 20 MG tablet TAKE 1 TABLET BY MOUTH ONCE DAILY 90 tablet 1  . B Complex Vitamins (VITAMIN B COMPLEX) TABS Take 1 tablet by mouth  daily.    . cholecalciferol (VITAMIN D) 400 UNITS TABS tablet Take 400 Units daily by mouth.     . Cyanocobalamin (VITAMIN B 12 PO) Take 1 capsule daily by mouth.     . esomeprazole (NEXIUM) 40 MG capsule Take 1 capsule (40 mg total) by mouth daily. 90 capsule 3  . ezetimibe (ZETIA) 10 MG tablet TAKE 1 TABLET BY MOUTH ONCE DAILY 90 tablet 0  . feeding supplement, ENSURE ENLIVE, (ENSURE ENLIVE) LIQD Take 237 mLs by mouth 2 (two) times daily between meals. 237 mL 12  . Ferrous Sulfate Dried (EQ SLOW-RELEASE IRON) 45 MG TBCR Take 1 tablet by mouth daily. 90 tablet 1  . Ibuprofen-diphenhydrAMINE Cit (ADVIL PM) 200-38 MG TABS Take 1 tablet by mouth as needed.    . isosorbide mononitrate (IMDUR) 60 MG 24 hr tablet TAKE 2 TABLETS BY MOUTH ONCE DAILY 180 tablet 2  . lisinopril (PRINIVIL,ZESTRIL) 20 MG tablet Take 1 tablet (20 mg total) by mouth daily. 90 tablet 1  . loperamide (IMODIUM) 2 MG capsule Take 1 capsule (2 mg total) by mouth as needed for diarrhea or loose stools. 30 capsule 0  . methimazole (TAPAZOLE) 10 MG tablet Take 1 tablet (10 mg total) by mouth 2 (two) times daily. 60 tablet 5  . nitroGLYCERIN (NITROSTAT) 0.4 MG SL tablet PLACE 1 TABLET UNDER THE TONGUE EVERY 5 MINUTES FOR CHEST PAIN FOR 3 DOSES 25 tablet 1  . oxybutynin (DITROPAN-XL) 10 MG 24 hr tablet Take 1 tablet (10 mg total) by mouth daily. 90 tablet 3  . oxyCODONE-acetaminophen (PERCOCET) 7.5-325 MG tablet as needed.  0  . potassium chloride (KLOR-CON 10) 10 MEQ tablet Take 1 tablet (10 mEq total) by mouth 2 (two) times daily. 60 tablet 5  . potassium chloride (MICRO-K) 10 MEQ CR capsule TAKE 1 CAPSULE BY MOUTH TWICE A DAY 180 capsule 1  . propranolol (INDERAL) 60 MG tablet Take 1 tablet (60 mg total) by mouth 2 (two) times daily. 60 tablet 0  . traMADol (ULTRAM) 50 MG tablet Take 1-2 tablets (50-100 mg total) by mouth every 6 (six) hours as needed for moderate pain. 30 tablet 0  . venlafaxine XR (EFFEXOR-XR) 150 MG 24 hr capsule  TAKE ONE CAPSULE BY MOUTH EVERY NIGHT AT BEDTIME 90 capsule 1  . vitamin C (ASCORBIC ACID) 500 MG tablet Take 500 mg  daily by mouth.     . vitamin E (VITAMIN E) 1000 UNIT capsule Take 1,000 Units daily by mouth.      No current facility-administered medications on file prior to visit.     Allergies  Allergen Reactions  . Sulfa Drugs Cross Reactors Shortness Of Breath and Palpitations    Family History  Problem Relation Age of Onset  . CAD Brother   . Diabetes Brother   . CAD Father   . Diabetes Father   . Diabetes Mother        father, sister, brothers  . Diabetes Sister   . Thyroid disease Sister     BP 120/62 (BP Location: Left Arm, Patient Position: Sitting, Cuff Size: Normal)   Pulse 72   Ht  (1.676 m)   Wt 133 lb (60.3 kg)   SpO2 98%   BMI 21.47 kg/m    Review of Systems Denies fever and tremor.     Objective:   Physical Exam VS: see vs page.   GEN: no distress.  NECK: thyroid is approx 3 times normal size, with multinodular surface (R>L).   HEART:  Regular rate and rhythm without murmurs noted. Normal S1,S2.    Lab Results  Component Value Date   TSH 58.53 (H) 03/08/2018      Assessment & Plan:  Hypothyroidism: overcontrolled.  Noncompliance with medication.  This is causing overcontrol MI: she is now far enough out that we can do RAI  I ordered nuc med scan, with plan for RAI

## 2018-03-09 LAB — TSH: TSH: 58.53 u[IU]/mL — AB (ref 0.35–4.50)

## 2018-03-09 LAB — T4, FREE: Free T4: 0.12 ng/dL — ABNORMAL LOW (ref 0.60–1.60)

## 2018-04-25 ENCOUNTER — Encounter

## 2018-05-01 NOTE — Progress Notes (Signed)
Cardiology Office Note    Date:  05/02/2018   ID:  [name], DOB 1946/09/24, MRN [id number]  PCP:  Katherine Majestic, MD  Cardiologist: Katherine Noe, MD   Chief Complaint  Patient presents with  . Coronary Artery Disease    History of Present Illness:  [name] is a 72 y.o. female with hx of CAD with coronary spasm, HTN, HLD, Grave's Dz and multinodular goiter (followed by Dr. Haynes Reynolds) presents for chronic cardiology follow-up.  I have not seen [name] in several years.  She has nonobstructive coronary disease.  She has occasional chest discomfort.  Also has hypertension and diastolic dysfunction.  Her most recent problems are related to Graves' disease which is now being treated with Tapazole.   Past Medical History:  Diagnosis Date  . Arthritis   . Chronic lower back pain   . Cluster headache   . Coronary artery disease    a. 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 non obs dz LHC 2007. c. Non obs dz (mild-mod) by [facility] 05/17/14 d. cath 05/26/17 mild non obstructive CAD  . Diverticulitis    a. Hx microperf 2012 - hospitalizated.  Marland Kitchen GERD (gastroesophageal reflux disease)   . Hypercholesteremia   . Hypertension   . PVC's (premature ventricular contractions)    a. Hx of trigeminy/bigeminy.  . Seizures (HCC)   . Thyroid disease    hyperthyroid  . UTI (lower urinary tract infection)     Past Surgical History:  Procedure Laterality Date  . ABDOMINAL HYSTERECTOMY  1987   "partial"-still has ovaries  . CARDIAC CATHETERIZATION  05/17/2014  . CORONARY ANGIOPLASTY WITH STENT PLACEMENT  1995   "1"  . JOINT REPLACEMENT     right knee  . LEFT HEART CATH AND CORONARY ANGIOGRAPHY N/A 05/26/2017   Procedure: Left Heart Cath and Coronary Angiography;  Surgeon: Katherine Crafts, MD;  Location: [facility] INVASIVE CV LAB;  Service: Cardiovascular;  Laterality: N/A;  . LEFT HEART CATHETERIZATION WITH CORONARY ANGIOGRAM N/A 05/17/2014   Procedure: LEFT HEART CATHETERIZATION WITH CORONARY ANGIOGRAM;   Surgeon: Katherine Lex, MD;  Location: [facility] CATH LAB;  Service: Cardiovascular;  Laterality: N/A;  . TOTAL KNEE ARTHROPLASTY Right 2005  . TUBAL LIGATION  1970  . VASCULAR SURGERY      Current Medications: Outpatient Medications Prior to Visit  Medication Sig Dispense Refill  . acetaminophen (TYLENOL) 325 MG tablet Take 650 mg by mouth every 6 (six) hours as needed for moderate pain.     Marland Kitchen amLODipine (NORVASC) 10 MG tablet TAKE 1 TABLET BY MOUTH ONCE DAILY 30 tablet 5  . aspirin 325 MG EC tablet Take 325 mg by mouth every other day.    Marland Kitchen aspirin EC 81 MG tablet Take 81 mg daily by mouth.    Marland Kitchen atorvastatin (LIPITOR) 20 MG tablet TAKE 1 TABLET BY MOUTH ONCE DAILY 90 tablet 1  . B Complex Vitamins (VITAMIN B COMPLEX) TABS Take 1 tablet by mouth daily.    . cholecalciferol (VITAMIN D) 400 UNITS TABS tablet Take 400 Units daily by mouth.     . Cyanocobalamin (VITAMIN B 12 PO) Take 1 capsule daily by mouth.     . esomeprazole (NEXIUM) 40 MG capsule Take 1 capsule (40 mg total) by mouth daily. 90 capsule 3  . ezetimibe (ZETIA) 10 MG tablet TAKE 1 TABLET BY MOUTH ONCE DAILY 90 tablet 0  . feeding supplement, ENSURE ENLIVE, (ENSURE ENLIVE) LIQD Take 237 mLs by mouth 2 (two) times  daily between meals. 237 mL 12  . Ferrous Sulfate Dried (EQ SLOW-RELEASE IRON) 45 MG TBCR Take 1 tablet by mouth daily. 90 tablet 1  . Ibuprofen-diphenhydrAMINE Cit (ADVIL PM) 200-38 MG TABS Take 1 tablet by mouth as needed.    . isosorbide mononitrate (IMDUR) 60 MG 24 hr tablet TAKE 2 TABLETS BY MOUTH ONCE DAILY 180 tablet 2  . lisinopril (PRINIVIL,ZESTRIL) 20 MG tablet Take 1 tablet (20 mg total) by mouth daily. 90 tablet 1  . loperamide (IMODIUM) 2 MG capsule Take 1 capsule (2 mg total) by mouth as needed for diarrhea or loose stools. 30 capsule 0  . methimazole (TAPAZOLE) 10 MG tablet Take 1 tablet (10 mg total) by mouth 2 (two) times daily. 60 tablet 5  . nitroGLYCERIN (NITROSTAT) 0.4 MG SL tablet PLACE 1 TABLET UNDER  THE TONGUE EVERY 5 MINUTES FOR CHEST PAIN FOR 3 DOSES 25 tablet 1  . oxybutynin (DITROPAN-XL) 10 MG 24 hr tablet Take 1 tablet (10 mg total) by mouth daily. 90 tablet 3  . oxyCODONE-acetaminophen (PERCOCET) 7.5-325 MG tablet as needed.  0  . potassium chloride (KLOR-CON 10) 10 MEQ tablet Take 1 tablet (10 mEq total) by mouth 2 (two) times daily. 60 tablet 5  . potassium chloride (MICRO-K) 10 MEQ CR capsule TAKE 1 CAPSULE BY MOUTH TWICE A DAY 180 capsule 1  . propranolol (INDERAL) 60 MG tablet Take 1 tablet (60 mg total) by mouth 2 (two) times daily. 60 tablet 0  . traMADol (ULTRAM) 50 MG tablet Take 1-2 tablets (50-100 mg total) by mouth every 6 (six) hours as needed for moderate pain. 30 tablet 0  . venlafaxine XR (EFFEXOR-XR) 150 MG 24 hr capsule TAKE ONE CAPSULE BY MOUTH EVERY NIGHT AT BEDTIME 90 capsule 1  . vitamin C (ASCORBIC ACID) 500 MG tablet Take 500 mg daily by mouth.     . vitamin E (VITAMIN E) 1000 UNIT capsule Take 1,000 Units daily by mouth.      No facility-administered medications prior to visit.      Allergies:   Sulfa drugs cross reactors   Social History   Socioeconomic History  . Marital status: Married    Spouse name: Not on file  . Number of children: 4  . Years of education: 4  . Highest education level: Not on file  Occupational History    Comment: retired  Engineer, production  . Financial resource strain: Not on file  . Food insecurity:    Worry: Not on file    Inability: Not on file  . Transportation needs:    Medical: Not on file    Non-medical: Not on file  Tobacco Use  . Smoking status: Never Smoker  . Smokeless tobacco: Never Used  Substance and Sexual Activity  . Alcohol use: No  . Drug use: No  . Sexual activity: Never  Lifestyle  . Physical activity:    Days per week: Not on file    Minutes per session: Not on file  . Stress: Not on file  Relationships  . Social connections:    Talks on phone: Not on file    Gets together: Not on file     Attends religious service: Not on file    Active member of club or organization: Not on file    Attends meetings of clubs or organizations: Not on file    Relationship status: Not on file  Other Topics Concern  . Not on file  Social History Narrative  Separated. 4 children from first marriage. 16 grandkids.       Retired from [employer] for 25 years, went to [employer] for 5 years. Retired 2003 after MI      Hobbies: babysit/active with children      Right-handed      Caffeine: 24 oz soda per day        Family History:  The patient's family history includes CAD in her brother and father; Diabetes in her brother, father, mother, and sister; Thyroid disease in her sister.   ROS:   Please see the history of present illness.    Chronic dyspnea on exertion.  Otherwise no complaints.  Has occasional palpitations.  Episodes can last up to 5 minutes. All other systems reviewed and are negative.   PHYSICAL EXAM:   VS:  BP 132/74   Pulse 98   Ht 5\' 6"  (1.676 m)   Wt 120 lb (54.4 kg)   BMI 19.37 kg/m    GEN: Well nourished, well developed, in no acute distress  HEENT: normal  Neck: no JVD, carotid bruits, or masses Cardiac: RRR; no murmurs, rubs, or gallops,no edema  Respiratory:  clear to auscultation bilaterally, normal work of breathing GI: soft, nontender, nondistended, + BS MS: no deformity or atrophy  Skin: warm and dry, no rash Neuro:  Alert and Oriented x 3, Strength and sensation are intact Psych: euthymic mood, full affect  Wt Readings from Last 3 Encounters:  05/02/18 120 lb (54.4 kg)  03/08/18 133 lb (60.3 kg)  01/05/18 139 lb 6.4 oz (63.2 kg)      Studies/Labs Reviewed:   EKG:  EKG  Not performed  Recent Labs: 05/10/2017: Pro B Natriuretic peptide (BNP) 289.0 05/22/2017: B Natriuretic Peptide 900.5 05/24/2017: Magnesium 1.9 01/20/2018: ALT 26; BUN 13; Creatinine, Ser 0.83; Hemoglobin 10.2; Platelets 197.0; Potassium 4.2; Sodium 141 03/08/2018: TSH 58.53   Lipid  Panel    Component Value Date/Time   CHOL 75 05/23/2017 0514   TRIG 58 05/23/2017 0514   HDL 25 (L) 05/23/2017 0514   CHOLHDL 3.0 05/23/2017 0514   VLDL 12 05/23/2017 0514   LDLCALC 38 05/23/2017 0514    Additional studies/ records that were reviewed today include:  Coronary angiography August 2018: Coronary Diagrams   Diagnostic Diagram        2D Doppler echocardiogram August 2018: Study Conclusions  - Left ventricle: The cavity size was normal. Systolic function was   normal. The estimated ejection fraction was in the range of 50%   to 55%. Hypokinesis of the basal-midinferior myocardium;   consistent with infarction in the distribution of the right   coronary artery. Features are consistent with a pseudonormal left   ventricular filling pattern, with concomitant abnormal relaxation   and increased filling pressure (grade 2 diastolic dysfunction). - Mitral valve: Malcoaptation due to mildly restricted motion of   the posterior leaflet, consistent with ischemic tethering. There   was mild to moderate regurgitation directed eccentrically and   posteriorly. - Left atrium: The atrium was moderately dilated.      ASSESSMENT:    1. Essential hypertension   2. Coronary artery disease involving native coronary artery of native heart with angina pectoris (HCC)   3. Hypercholesteremia   4. Chronic diastolic heart failure (HCC)      PLAN:  In order of problems listed above:  1. Excellent blood pressure control.  Target 130/80 mmHg. 2. Nonobstructive coronary disease.  Prior history of vasospastic angina.  Recent cath demonstrated  no significant obstructive disease.   3. LDL target should be less than 70.  Most recent laboratory data year ago was LDL of 38. 4. Evidence of volume overload.  Continue excellent blood pressure control.  Clinical follow-up in 1 year with me or team member.  No change in current therapeutic regimen.  Medication Adjustments/Labs and Tests  Ordered: Current medicines are reviewed at length with the patient today.  Concerns regarding medicines are outlined above.  Medication changes, Labs and Tests ordered today are listed in the Patient Instructions below. Patient Instructions  Medication Instructions:  Your physician recommends that you continue on your current medications as directed. Please refer to the Current Medication list given to you today.  Labwork: None  Testing/Procedures: None  Follow-Up: Your physician wants you to follow-up in: 1 year with Dr. Katrinka Blazing.  You will receive a reminder letter in the mail two months in advance. If you don't receive a letter, please call our office to schedule the follow-up appointment.   Any Other Special Instructions Will Be Listed Below (If Applicable).     If you need a refill on your cardiac medications before your next appointment, please call your pharmacy.      Signed, Katherine Noe, MD  05/02/2018 10:45 AM    [facility] [address] [place], [place], [place]  [address] Phone: [phone]; Fax: [phone]

## 2018-05-02 ENCOUNTER — Encounter: Payer: Self-pay | Admitting: Interventional Cardiology

## 2018-05-02 ENCOUNTER — Ambulatory Visit: Payer: Medicare HMO | Admitting: Interventional Cardiology

## 2018-05-02 ENCOUNTER — Encounter (INDEPENDENT_AMBULATORY_CARE_PROVIDER_SITE_OTHER): Payer: Self-pay

## 2018-05-02 VITALS — BP 132/74 | HR 98 | Ht 66.0 in | Wt 120.0 lb

## 2018-05-02 DIAGNOSIS — I5032 Chronic diastolic (congestive) heart failure: Secondary | ICD-10-CM

## 2018-05-02 DIAGNOSIS — I25119 Atherosclerotic heart disease of native coronary artery with unspecified angina pectoris: Secondary | ICD-10-CM | POA: Diagnosis not present

## 2018-05-02 DIAGNOSIS — E7800 Pure hypercholesterolemia, unspecified: Secondary | ICD-10-CM | POA: Diagnosis not present

## 2018-05-02 DIAGNOSIS — I10 Essential (primary) hypertension: Secondary | ICD-10-CM

## 2018-05-02 NOTE — Patient Instructions (Signed)

## 2018-05-06 ENCOUNTER — Other Ambulatory Visit: Payer: Self-pay | Admitting: Family Medicine

## 2018-05-11 ENCOUNTER — Other Ambulatory Visit: Payer: Self-pay | Admitting: Family Medicine

## 2018-05-18 ENCOUNTER — Ambulatory Visit: Payer: Managed Care, Other (non HMO) | Admitting: Endocrinology

## 2018-05-18 DIAGNOSIS — Z0289 Encounter for other administrative examinations: Secondary | ICD-10-CM

## 2018-06-18 ENCOUNTER — Other Ambulatory Visit: Payer: Self-pay | Admitting: Family Medicine

## 2018-06-20 ENCOUNTER — Other Ambulatory Visit: Payer: Self-pay | Admitting: Family Medicine

## 2018-06-21 ENCOUNTER — Other Ambulatory Visit: Payer: Self-pay | Admitting: Family Medicine

## 2018-07-05 ENCOUNTER — Ambulatory Visit: Payer: Self-pay | Admitting: *Deleted

## 2018-07-05 NOTE — Telephone Encounter (Signed)
Pt states she has experienced blurred vision with since Saturday morning. Pt also states she has experienced joint pain in her shoulders and hips and feels week and "swimmy headed". Pt states that her BP onSunday was 101/59 but does not have a reading from today and states she did not take her BP meds on yesterday. Pt states she wears glasses with driving and having to look far away. Pt states blurry vision has improved since Saturday and she is able to see things close up but it still remains blurry. Pt states she also has a headache and does have a history of migraines but does not think that current symptoms are related to migraines. Pt scheduled for appt on 9/11 with Dr. Durene Cal. Pt advised that if symptoms become worse before scheduled appt on tomorrow to seek care in the ED. Pt verbalized understanding. Reason for Disposition . [1] Headache AND [2] age > 53  Answer Assessment - Initial Assessment Questions 1. DESCRIPTION: "What is the vision loss like? Describe it for me." (e.g., complete vision loss, blurred vision, double vision, floaters, etc.)     Pt states her vision is blurry and she feels "swimmy" headed 2. LOCATION: "One or both eyes?" If one, ask: "Which eye?"     Both eyes 3. SEVERITY: "Can you see anything?" If so, ask: "What can you see?" (e.g., fine print)     "I can see good", vision still blurry but has improved since saturday 4. ONSET: "When did this begin?" "Did it start suddenly or has this been gradual?"     Pt states she started to have beginning on Thurs-Fri but became worse on Saturday 5. PATTERN: "Does this come and go, or has it been constant since it started?"     Coming and going but not as 6. PAIN: "Is there any pain in your eye(s)?"  (Scale 1-10; or mild, moderate, severe)     No 7. CONTACTS-GLASSES: "Do you wear contacts or glasses?"     Wears glasses with driving and states she has to wear the glasses to see far away 8. CAUSE "What do you think is causing this  visual problem?"    unsure 9. OTHER SYMPTOMS: "Do you have any other symptoms?" (e.g., confusion, headache, arm or leg weakness, speech problems)   Pt states she has headaches anyway with a hx of migraines, pt states she feels weak but not like it was on saturday 10. PREGNANCY: "Is there any chance you are pregnant?" "When was your last menstrual period?"       n/a  Protocols used: VISION LOSS OR CHANGE-A-AH

## 2018-07-06 ENCOUNTER — Encounter: Payer: Self-pay | Admitting: Family Medicine

## 2018-07-06 ENCOUNTER — Ambulatory Visit (INDEPENDENT_AMBULATORY_CARE_PROVIDER_SITE_OTHER): Payer: Medicare HMO | Admitting: Family Medicine

## 2018-07-06 VITALS — BP 110/80 | HR 85 | Temp 97.9°F | Ht 66.0 in | Wt 126.4 lb

## 2018-07-06 DIAGNOSIS — Z1211 Encounter for screening for malignant neoplasm of colon: Secondary | ICD-10-CM | POA: Diagnosis not present

## 2018-07-06 DIAGNOSIS — R002 Palpitations: Secondary | ICD-10-CM | POA: Diagnosis not present

## 2018-07-06 DIAGNOSIS — Z1231 Encounter for screening mammogram for malignant neoplasm of breast: Secondary | ICD-10-CM | POA: Diagnosis not present

## 2018-07-06 DIAGNOSIS — E0590 Thyrotoxicosis, unspecified without thyrotoxic crisis or storm: Secondary | ICD-10-CM | POA: Diagnosis not present

## 2018-07-06 DIAGNOSIS — M2550 Pain in unspecified joint: Secondary | ICD-10-CM | POA: Diagnosis not present

## 2018-07-06 DIAGNOSIS — Z1239 Encounter for other screening for malignant neoplasm of breast: Secondary | ICD-10-CM

## 2018-07-06 DIAGNOSIS — H538 Other visual disturbances: Secondary | ICD-10-CM

## 2018-07-06 LAB — COMPREHENSIVE METABOLIC PANEL
ALT: 37 U/L — ABNORMAL HIGH (ref 0–35)
AST: 37 U/L (ref 0–37)
Albumin: 3.7 g/dL (ref 3.5–5.2)
Alkaline Phosphatase: 81 U/L (ref 39–117)
BUN: 27 mg/dL — AB (ref 6–23)
CHLORIDE: 105 meq/L (ref 96–112)
CO2: 29 meq/L (ref 19–32)
Calcium: 10.6 mg/dL — ABNORMAL HIGH (ref 8.4–10.5)
Creatinine, Ser: 0.88 mg/dL (ref 0.40–1.20)
GFR: 81.31 mL/min (ref >60.00)
GLUCOSE: 107 mg/dL — AB (ref 70–99)
POTASSIUM: 4.4 meq/L (ref 3.5–5.1)
SODIUM: 140 meq/L (ref 135–145)
TOTAL PROTEIN: 6.9 g/dL (ref 6.0–8.3)
Total Bilirubin: 1.6 mg/dL — ABNORMAL HIGH (ref 0.2–1.2)

## 2018-07-06 LAB — CBC
HEMATOCRIT: 35.4 % — AB (ref 36.0–46.0)
Hemoglobin: 11.8 g/dL — ABNORMAL LOW (ref 12.0–15.0)
MCHC: 33.2 g/dL (ref 30.0–36.0)
MCV: 94.5 fl (ref 78.0–100.0)
Platelets: 226 10*3/uL (ref 150.0–400.0)
RBC: 3.75 Mil/uL — ABNORMAL LOW (ref 3.87–5.11)
RDW: 13.3 % (ref 11.5–15.5)
WBC: 3.9 10*3/uL — AB (ref 4.0–10.5)

## 2018-07-06 LAB — TSH: TSH: <0.01 u[IU]/mL — ABNORMAL LOW (ref 0.35–4.50)

## 2018-07-06 LAB — T3, FREE: T3, Free: 22.1 pg/mL — ABNORMAL HIGH (ref 2.3–4.2)

## 2018-07-06 LAB — T4, FREE: Free T4: 4.98 ng/dL — ABNORMAL HIGH (ref 0.60–1.60)

## 2018-07-06 NOTE — Assessment & Plan Note (Signed)
I suspect palpitations, fatigue sensation may be coming from her hyperthyroidism considering her noncompliance with methimazole (every other day 61m instead of 171mBID that Dr. ElLoanne Drillingrescribed). She has had multiple issues when coming off of medicine not in coordination with her endocrinologist- encouraged her to restart and schedule close follow up with Dr. ElLoanne DrillingWill update thyroid labs for confirmation of suspicion. Will also get cbc, cmp to look at other causes. As far as arthralgias in hips and shoulders- considered ESR, CRP but could be falsely high in light of hyperthyroidism. Given her arthralgias are much improved- I doubt PMR anyway. I doubt temporal arteritis in regards to blurry vision given issues have resolved and she doesn't have a new headache pattern (instead has her chronic headache pattern.

## 2018-07-06 NOTE — Patient Instructions (Addendum)
Health Maintenance Due  Topic Date Due  . MAMMOGRAM - see handout 10/29/1996  . COLONOSCOPY - We will call you within two weeks about your referral to GI. If you do not hear within 3 weeks, give [place] a call.  10/29/1996  . INFLUENZA VACCINE - declines for now  05/26/2018   Please see your eye doctor ASAP if any recurrence of blurry vision see [place] back. Take your glasses with you   EKG looks stable  Im glad you are improving- I wonder if your thyroid is the culprit so updating labs on that. I want you to see [place] back for new or worsening symptoms   Please stop by lab before you go

## 2018-07-06 NOTE — Progress Notes (Signed)
Subjective:  [name] is a 72 y.o. year old very pleasant female patient who presents for/with See problem oriented charting ROS-  no fever or chills. Has had some fatigue and arthralgias, no true weakness. Continued issues with headaches. Also had blurry vision which has resolved- only mild dizziness at this time persists from weekend symtpoms  Past Medical History-  Patient Active Problem List   Diagnosis Date Noted  . Hyperthyroidism 05/22/2017    Priority: High  . Coronary artery disease involving native coronary artery of native heart with angina pectoris [facility])     Priority: High  . Seizures ([place])     Priority: High  . Protein-calorie malnutrition, moderate ([place]) 05/22/2017    Priority: Medium  . Overactive bladder 09/19/2014    Priority: Medium  . Hypercholesteremia     Priority: Medium  . GERD (gastroesophageal reflux disease) 10/04/2011    Priority: Medium  . HTN (hypertension) 10/01/2011    Priority: Medium  . Arthritis     Priority: Low  . Anemia 12/22/2017  . Demand ischemia ([place])   . Dark stools 05/22/2017    Medications- reviewed and updated Current Outpatient Medications  Medication Sig Dispense Refill  . acetaminophen (TYLENOL) 325 MG tablet Take 650 mg by mouth every 6 (six) hours as needed for moderate pain.     Marland Kitchen amLODipine (NORVASC) 10 MG tablet TAKE 1 TABLET BY MOUTH EVERY DAY 30 tablet 2  . aspirin 325 MG EC tablet Take 325 mg by mouth every other day.    Marland Kitchen aspirin EC 81 MG tablet Take 81 mg daily by mouth.    Marland Kitchen atorvastatin (LIPITOR) 20 MG tablet TAKE 1 TABLET BY MOUTH ONCE DAILY 90 tablet 1  . B Complex Vitamins (VITAMIN B COMPLEX) TABS Take 1 tablet by mouth daily.    . cholecalciferol (VITAMIN D) 400 UNITS TABS tablet Take 400 Units daily by mouth.     . Cyanocobalamin (VITAMIN B 12 PO) Take 1 capsule daily by mouth.     . esomeprazole (NEXIUM) 40 MG capsule Take 1 capsule (40 mg total) by mouth daily. 90 capsule 3  . ezetimibe (ZETIA) 10 MG  tablet TAKE 1 TABLET BY MOUTH ONCE DAILY 90 tablet 1  . feeding supplement, ENSURE ENLIVE, (ENSURE ENLIVE) LIQD Take 237 mLs by mouth 2 (two) times daily between meals. 237 mL 12  . Ferrous Sulfate Dried (EQ SLOW-RELEASE IRON) 45 MG TBCR Take 1 tablet by mouth daily. 90 tablet 1  . Ibuprofen-diphenhydrAMINE Cit (ADVIL PM) 200-38 MG TABS Take 1 tablet by mouth as needed.    . isosorbide mononitrate (IMDUR) 60 MG 24 hr tablet TAKE 2 TABLETS BY MOUTH ONCE DAILY 180 tablet 2  . lisinopril (PRINIVIL,ZESTRIL) 20 MG tablet TAKE 1 TABLET BY MOUTH ONCE DAILY 90 tablet 0  . loperamide (IMODIUM) 2 MG capsule Take 1 capsule (2 mg total) by mouth as needed for diarrhea or loose stools. 30 capsule 0  . methimazole (TAPAZOLE) 10 MG tablet Take 1 tablet (10 mg total) by mouth 2 (two) times daily. 60 tablet 5  . nitroGLYCERIN (NITROSTAT) 0.4 MG SL tablet PLACE 1 TABLET UNDER THE TONGUE EVERY 5 MINUTES FOR CHEST PAIN FOR 3 DOSES 25 tablet 1  . oxybutynin (DITROPAN-XL) 10 MG 24 hr tablet Take 1 tablet (10 mg total) by mouth daily. 90 tablet 3  . oxyCODONE-acetaminophen (PERCOCET) 7.5-325 MG tablet as needed.  0  . potassium chloride (KLOR-CON 10) 10 MEQ tablet Take 1 tablet (10 mEq total)  by mouth 2 (two) times daily. 60 tablet 5  . potassium chloride (MICRO-K) 10 MEQ CR capsule TAKE 1 CAPSULE BY MOUTH TWICE A DAY 180 capsule 1  . propranolol (INDERAL) 60 MG tablet TAKE 1 TABLET BY MOUTH TWICE DAILY 180 tablet 0  . traMADol (ULTRAM) 50 MG tablet Take 1-2 tablets (50-100 mg total) by mouth every 6 (six) hours as needed for moderate pain. 30 tablet 0  . venlafaxine XR (EFFEXOR-XR) 150 MG 24 hr capsule TAKE ONE CAPSULE BY MOUTH EVERY NIGHT AT BEDTIME 90 capsule 1  . vitamin C (ASCORBIC ACID) 500 MG tablet Take 500 mg daily by mouth.     . vitamin E (VITAMIN E) 1000 UNIT capsule Take 1,000 Units daily by mouth.      Objective: BP 110/80 (BP Location: Left Arm, Patient Position: Sitting, Cuff Size: Large)   Pulse 85    Temp 97.9 F (36.6 C) (Oral)   Ht _0  (1.676 m)   Wt 126 lb 6.4 oz (57.3 kg)   SpO2 98%   BMI 20.40 kg/m  Gen: NAD, resting comfortably Thyromegaly noted CV: RRR no murmurs rubs or gallops Lungs: CTAB no crackles, wheeze, rhonchi Abdomen: soft/nontender/nondistended Ext: no edema Skin: warm, dry Neuro: CN II-XII intact, sensation and reflexes normal throughout, 5/5 muscle strength in bilateral upper and lower extremities. Normal finger to nose. Normal rapid alternating movements. No pronator drift. Normal romberg. Normal gait.   EKG: sinus rhythm with rate 74, normal axis, normal qt and qrs  Intervals but short PR interval, left atrial hypertrophy and possible LVH, noted depressions in v4 and v5. Otherwise no st or t wave changes. This is unchanged from 10/02/17 EKG. Also note had nonobstructive CAD on catheterization 05/26/17  Not wearing glasses for eye exam  Assessment/Plan:  Blurry vision Arthralgia Palpitations - Plan: CBC, Comprehensive metabolic panel, TSH, EKG [id number] Hyperthyroidism - Plan: TSH, T4, free, T3, free S:   From [facility] triage "Pt states she has experienced blurred vision with since Saturday morning. Pt also states she has experienced joint pain in her shoulders and hips and feels week and "swimmy headed". Pt states that her BP onSunday was 101/59 but does not have a reading from today and states she did not take her BP meds on yesterday. Pt states she wears glasses with driving and having to look far away. Pt states blurry vision has improved since Saturday and she is able to see things close up but it still remains blurry. Pt states she also has a headache and does have a history of migraines but does not think that current symptoms are related to migraines. Pt scheduled for appt on 9/11 with Dr. Yong Channel. Pt advised that if symptoms become worse before scheduled appt on tomorrow to seek care in the ED. Pt verbalized understanding."  Today patient reports that Last week  she states felt fatigued. For about 2 weeks has been having some low back pain. States has had some incontinence with this which is an intermittent issue for her. On Saturday morning felt really weak- hurting left and right shoulder, left and right hip- tossed and turned and couldn't sleep. Her arthralgias are now much improved.  Vision was blurry that morning and but improved daily- and has now resolved. She also feels somewhat dizzy- this has also much improved but not fully gone. Joints hurt with movement. Also had some mild chest pain and palpaitations on Saturday and Sunday- now resolved.    From Dr. Thompson Caul note 05/02/18 "  Nonobstructive coronary disease.  Prior history of vasospastic angina.  Recent cath demonstrated no significant obstructive disease. "  She is taking methimazole every other day instead of daily while she tries to get into endocrine- missed appointment. She started this 2 weeks ago trying to see if she could come off-   Continues to have her headaches- not a new or different pattern A/P:  72 year old female with noncompliance related to hyperthyroidism treatment, arthralgias now improving, blurry vision now resolved most likely due in part to... Hyperthyroidism I suspect palpitations, fatigue sensation may be coming from her hyperthyroidism considering her noncompliance with methimazole (every other day 9m instead of 154mBID that Dr. ElLoanne Drillingrescribed). She has had multiple issues when coming off of medicine not in coordination with her endocrinologist- encouraged her to restart and schedule close follow up with Dr. ElLoanne DrillingWill update thyroid labs for confirmation of suspicion. Will also get cbc, cmp to look at other causes. As far as arthralgias in hips and shoulders- considered ESR, CRP but could be falsely high in light of hyperthyroidism. Given her arthralgias are much improved- I doubt PMR anyway. I doubt temporal arteritis in regards to blurry vision given issues have  resolved and she doesn't have a new headache pattern (instead has her chronic headache pattern.  -EKG stable from 2018 exam plus had cath last year with nonobstructive CAD.  -Doubt ischemia- though does have vasospasms at times - palpitations could be PVCs which she has had in past- discussed thyroids role -advised optho visit ASAP- she agrees to schedule  HM related Health Maintenance Due  Topic Date Due  . MAMMOGRAM - see handout 10/29/1996  . COLONOSCOPY - We will call you within two weeks about your referral to GI. If you do not hear within 3 weeks, [place].  10/29/1996  . INFLUENZA VACCINE - declines for now  05/26/2018    Hyperthyroidism I suspect palpitations, fatigue sensation may be coming from her hyperthyroidism considering her noncompliance with methimazole (every other day 1078mnstead of 48m48mD that Dr. ElliLoanne Drillingscribed). She has had multiple issues when coming off of medicine not in coordination with her endocrinologist- encouraged her to restart and schedule close follow up with Dr. ElliLoanne Drillingll update thyroid labs for confirmation of suspicion. Will also get cbc, cmp to look at other causes. As far as arthralgias in hips and shoulders- considered ESR, CRP but could be falsely high in light of hyperthyroidism. Given her arthralgias are much improved- I doubt PMR anyway. I doubt temporal arteritis in regards to blurry vision given issues have resolved and she doesn't have a new headache pattern (instead has her chronic headache pattern.   Lab/Order associations: Blurry vision  Arthralgia, unspecified joint  Palpitations - Plan: CBC, Comprehensive metabolic panel, TSH, EKG [id number] - Plan: TSH, T4, free, T3, free  Screening for breast cancer - Plan: MM Digital Screening  Colon cancer screening - Plan: Ambulatory referral to Gastroenterology  Return precautions advised.  StepGarret Reddish

## 2018-07-07 ENCOUNTER — Other Ambulatory Visit: Payer: Self-pay

## 2018-07-07 DIAGNOSIS — R17 Unspecified jaundice: Secondary | ICD-10-CM

## 2018-07-10 ENCOUNTER — Emergency Department (HOSPITAL_COMMUNITY)
Admission: EM | Admit: 2018-07-10 | Discharge: 2018-07-11 | Disposition: A | Discharge status: Home / Self Care | Payer: Medicare HMO | Attending: Emergency Medicine | Admitting: Emergency Medicine

## 2018-07-10 ENCOUNTER — Emergency Department (HOSPITAL_COMMUNITY): Payer: Medicare HMO

## 2018-07-10 ENCOUNTER — Other Ambulatory Visit: Payer: Self-pay

## 2018-07-10 ENCOUNTER — Encounter (HOSPITAL_COMMUNITY): Payer: Self-pay | Admitting: Emergency Medicine

## 2018-07-10 DIAGNOSIS — I209 Angina pectoris, unspecified: Secondary | ICD-10-CM | POA: Diagnosis not present

## 2018-07-10 DIAGNOSIS — R42 Dizziness and giddiness: Secondary | ICD-10-CM | POA: Diagnosis not present

## 2018-07-10 DIAGNOSIS — I208 Other forms of angina pectoris: Secondary | ICD-10-CM

## 2018-07-10 DIAGNOSIS — Z79899 Other long term (current) drug therapy: Secondary | ICD-10-CM | POA: Diagnosis not present

## 2018-07-10 DIAGNOSIS — R350 Frequency of micturition: Secondary | ICD-10-CM

## 2018-07-10 DIAGNOSIS — I10 Essential (primary) hypertension: Secondary | ICD-10-CM | POA: Diagnosis not present

## 2018-07-10 DIAGNOSIS — E039 Hypothyroidism, unspecified: Secondary | ICD-10-CM | POA: Insufficient documentation

## 2018-07-10 DIAGNOSIS — E860 Dehydration: Secondary | ICD-10-CM | POA: Diagnosis not present

## 2018-07-10 DIAGNOSIS — R51 Headache: Secondary | ICD-10-CM | POA: Insufficient documentation

## 2018-07-10 DIAGNOSIS — R519 Headache, unspecified: Secondary | ICD-10-CM

## 2018-07-10 DIAGNOSIS — Z7982 Long term (current) use of aspirin: Secondary | ICD-10-CM | POA: Diagnosis not present

## 2018-07-10 LAB — BASIC METABOLIC PANEL
ANION GAP: 13 (ref 5–15)
BUN: 19 mg/dL (ref 8–23)
CHLORIDE: 103 mmol/L (ref 98–111)
CO2: 23 mmol/L (ref 22–32)
Calcium: 10.2 mg/dL (ref 8.9–10.3)
Creatinine, Ser: 1.42 mg/dL — ABNORMAL HIGH (ref 0.44–1.00)
GFR, EST AFRICAN AMERICAN: 42 mL/min — AB (ref >60)
GFR, EST NON AFRICAN AMERICAN: 36 mL/min — AB (ref >60)
Glucose, Bld: 120 mg/dL — ABNORMAL HIGH (ref 70–99)
POTASSIUM: 3.7 mmol/L (ref 3.5–5.1)
SODIUM: 139 mmol/L (ref 135–145)

## 2018-07-10 LAB — CBC
HEMATOCRIT: 39.5 % (ref 36.0–46.0)
Hemoglobin: 12 g/dL (ref 12.0–15.0)
MCH: 30.7 pg (ref 26.0–34.0)
MCHC: 30.4 g/dL (ref 30.0–36.0)
MCV: 101 fL — AB (ref 78.0–100.0)
Platelets: 231 10*3/uL (ref 150–400)
RBC: 3.91 MIL/uL (ref 3.87–5.11)
RDW: 12.7 % (ref 11.5–15.5)
WBC: 4.8 10*3/uL (ref 4.0–10.5)

## 2018-07-10 MED ORDER — SODIUM CHLORIDE 0.9 % IV BOLUS
500.0000 mL | Freq: Once | INTRAVENOUS | Status: AC
  Administered 2018-07-10: 500 mL via INTRAVENOUS

## 2018-07-10 NOTE — ED Provider Notes (Signed)
[facility] EMERGENCY DEPARTMENT Provider Note   CSN: [id number] Arrival date & time: 07/10/18  2004     History   Chief Complaint Chief Complaint  Patient presents with  . Headache    HPI [name] is a 72 y.o. female.  Patient with a history of HTN, CAD, chronic back pain, HLD, seizures, thyroid disease, presents with multiple complaints including feeling lightheadedness and headaches "for a while". She has a "regular headache" but states her concern right now is that she is experiencing symptoms that are not like her usual symptoms. The HA's change in character over time and are located in the left side of her had and face, extend to her neck and sometimes across to the right neck. She sometimes has lightheadedness/dizziness with the headache that seems to be worse with position. She denies any headache or lightheadedness now. Her son is at bedside and reports he feels she is slurring her words. She is also having intermittent chest pain that is better with her NTG at home. Last used NTG one week ago. No pain now. She is also having urinary frequency that she mostly notices at night and that sometimes she can't make it to the bathroom without small leakage of urine. No SOB, cough, hematuria, URI symptoms. Saw PCP this past week without answers to her problems so she came to ED for further evaluation.Marland Kitchen     Headache   Pertinent negatives include no fever, no shortness of breath, no nausea and no vomiting.    Past Medical History:  Diagnosis Date  . Arthritis   . Chronic lower back pain   . Cluster headache   . Coronary artery disease    a. 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 non obs dz LHC 2007. c. Non obs dz (mild-mod) by [facility] 05/17/14 d. cath 05/26/17 mild non obstructive CAD  . Diverticulitis    a. Hx microperf 2012 - hospitalizated.  Marland Kitchen GERD (gastroesophageal reflux disease)   . Hypercholesteremia   . Hypertension   . PVC's (premature ventricular contractions)    a.  Hx of trigeminy/bigeminy.  . Seizures ([facility])   . Thyroid disease    hyperthyroid  . UTI (lower urinary tract infection)     Patient Active Problem List   Diagnosis Date Noted  . Anemia 12/22/2017  . Demand ischemia ([facility])   . Hyperthyroidism 05/22/2017  . Dark stools 05/22/2017  . Protein-calorie malnutrition, moderate ([facility]) 05/22/2017  . Overactive bladder 09/19/2014  . Coronary artery disease involving native coronary artery of native heart with angina pectoris ([facility])   . Seizures ([facility])   . Arthritis   . Hypercholesteremia   . GERD (gastroesophageal reflux disease) 10/04/2011  . HTN (hypertension) 10/01/2011    Past Surgical History:  Procedure Laterality Date  . ABDOMINAL HYSTERECTOMY  1987   "partial"-still has ovaries  . CARDIAC CATHETERIZATION  05/17/2014  . CORONARY ANGIOPLASTY WITH STENT PLACEMENT  1995   "1"  . JOINT REPLACEMENT     right knee  . LEFT HEART CATH AND CORONARY ANGIOGRAPHY N/A 05/26/2017   Procedure: Left Heart Cath and Coronary Angiography;  Surgeon: Corky Crafts, MD;  Location[facility]  INVASIVE CV LAB;  Service: Cardiovascular;  Laterality: N/A;  . LEFT HEART CATHETERIZATION WITH CORONARY ANGIOGRAM N/A 05/17/2014   Procedure: LEFT HEART CATHETERIZATION WITH CORONARY ANGIOGRAM;  Surgeon: Marykay Lex, MD;  Location: [facility] CATH LAB;  Service: Cardiovascular;  Laterality: N/A;  . TOTAL KNEE ARTHROPLASTY Right 2005  . TUBAL LIGATION  1970  . VASCULAR SURGERY       OB History   None      Home Medications    Prior to Admission medications   Medication Sig Start Date End Date Taking? Authorizing Provider  acetaminophen (TYLENOL) 325 MG tablet Take 650 mg by mouth every 6 (six) hours as needed for moderate pain.     [provider]  amLODipine (NORVASC) 10 MG tablet TAKE 1 TABLET BY MOUTH EVERY DAY 05/06/18   Shelva Majestic, MD  aspirin 325 MG EC tablet Take 325 mg by mouth every other day.    [provider]  aspirin EC 81 MG  tablet Take 81 mg daily by mouth.    [provider]  atorvastatin (LIPITOR) 20 MG tablet TAKE 1 TABLET BY MOUTH ONCE DAILY 02/04/18   Shelva Majestic, MD  B Complex Vitamins (VITAMIN B COMPLEX) TABS Take 1 tablet by mouth daily.    [provider]  cholecalciferol (VITAMIN D) 400 UNITS TABS tablet Take 400 Units daily by mouth.     [provider]  Cyanocobalamin (VITAMIN B 12 PO) Take 1 capsule daily by mouth.     [provider]  esomeprazole (NEXIUM) 40 MG capsule Take 1 capsule (40 mg total) by mouth daily. 11/26/17   Shelva Majestic, MD  ezetimibe (ZETIA) 10 MG tablet TAKE 1 TABLET BY MOUTH ONCE DAILY 05/11/18   Shelva Majestic, MD  feeding supplement, ENSURE ENLIVE, (ENSURE ENLIVE) LIQD Take 237 mLs by mouth 2 (two) times daily between meals. 05/25/17   Richarda Overlie, MD  Ferrous Sulfate Dried (EQ SLOW-RELEASE IRON) 45 MG TBCR Take 1 tablet by mouth daily. 01/21/18   Jarold Motto, PA  Ibuprofen-diphenhydrAMINE Cit (ADVIL PM) 200-38 MG TABS Take 1 tablet by mouth as needed.    [provider]  isosorbide mononitrate (IMDUR) 60 MG 24 hr tablet TAKE 2 TABLETS BY MOUTH ONCE DAILY 02/10/18   Shelva Majestic, MD  lisinopril (PRINIVIL,ZESTRIL) 20 MG tablet TAKE 1 TABLET BY MOUTH ONCE DAILY 06/21/18   Shelva Majestic, MD  loperamide (IMODIUM) 2 MG capsule Take 1 capsule (2 mg total) by mouth as needed for diarrhea or loose stools. 10/04/17   Regalado, Belkys A, MD  methimazole (TAPAZOLE) 10 MG tablet Take 1 tablet (10 mg total) by mouth 2 (two) times daily. 01/20/18   Romero Belling, MD  nitroGLYCERIN (NITROSTAT) 0.4 MG SL tablet PLACE 1 TABLET UNDER THE TONGUE EVERY 5 MINUTES FOR CHEST PAIN FOR 3 DOSES 07/12/17   Shelva Majestic, MD  oxybutynin (DITROPAN-XL) 10 MG 24 hr tablet Take 1 tablet (10 mg total) by mouth daily. 11/26/17   Shelva Majestic, MD  oxyCODONE-acetaminophen (PERCOCET) 7.5-325 MG tablet as needed. 02/21/18   [provider]    potassium chloride (KLOR-CON 10) 10 MEQ tablet Take 1 tablet (10 mEq total) by mouth 2 (two) times daily. 06/04/17   Shelva Majestic, MD  potassium chloride (MICRO-K) 10 MEQ CR capsule TAKE 1 CAPSULE BY MOUTH TWICE A DAY 01/24/18   Shelva Majestic, MD  propranolol (INDERAL) 60 MG tablet TAKE 1 TABLET BY MOUTH TWICE DAILY 06/20/18   Shelva Majestic, MD  traMADol (ULTRAM) 50 MG tablet Take 1-2 tablets (50-100 mg total) by mouth every 6 (six) hours as needed for moderate pain. 12/22/17   Jarold Motto, PA  venlafaxine XR (EFFEXOR-XR) 150 MG 24 hr capsule TAKE ONE CAPSULE BY MOUTH EVERY NIGHT AT BEDTIME 02/04/18  [name], Stephen O, MD  vitamin C (ASCORBIC ACID) 500 MG tablet Take 500 mg daily by mouth.     [provider]  vitamin E (VITAMIN E) 1000 UNIT capsule Take 1,000 Units daily by mouth.     [provider]    Family History Family History  Problem Relation Age of Onset  . CAD Brother   . Diabetes Brother   . CAD Father   . Diabetes Father   . Diabetes Mother        father, sister, brothers  . Diabetes Sister   . Thyroid disease Sister     Social History Social History   Tobacco Use  . Smoking status: Never Smoker  . Smokeless tobacco: Never Used  Substance Use Topics  . Alcohol use: No  . Drug use: No     Allergies   Sulfa drugs cross reactors   Review of Systems Review of Systems  Constitutional: Negative for chills and fever.  HENT: Negative.   Eyes: Negative for visual disturbance.  Respiratory: Negative.  Negative for cough and shortness of breath.   Cardiovascular: Positive for chest pain.  Gastrointestinal: Negative.  Negative for abdominal pain, nausea and vomiting.  Genitourinary: Positive for frequency.       See HPI.  Musculoskeletal: Negative.   Skin: Negative.   Neurological: Positive for speech difficulty (Per son), light-headedness and headaches.     Physical Exam Updated Vital Signs BP 116/63   Pulse 75   Temp (!)  97.5 F (36.4 C)   Resp 17   SpO2 99%   Physical Exam  Constitutional: She is oriented to person, place, and time. She appears well-developed and well-nourished.  HENT:  Head: Normocephalic.  Neck: Normal range of motion. Neck supple.  Cardiovascular: Normal rate and regular rhythm.  Pulmonary/Chest: Effort normal and breath sounds normal. She has no wheezes. She has no rales.  Abdominal: Soft. Bowel sounds are normal. There is tenderness (lower abdominal tenderness. ). There is no rebound and no guarding.  Musculoskeletal: Normal range of motion.  Neurological: She is alert and oriented to person, place, and time. She has normal strength. Coordination normal. GCS eye subscore is 4. GCS verbal subscore is 5. GCS motor subscore is 6.  CN's 3-12 grossly intact. Speech is clear and focused. No facial asymmetry. No lateralizing weakness. Reflexes are equal. No deficits of coordination. Ambulatory without imbalance.    Skin: Skin is warm and dry. No rash noted.  Psychiatric: She has a normal mood and affect.     ED Treatments / Results  Labs (all labs ordered are listed, but only abnormal results are displayed) Labs Reviewed  BASIC METABOLIC PANEL - Abnormal; Notable for the following components:      Result Value   Glucose, Bld 120 (*)    Creatinine, Ser 1.42 (*)    GFR calc non Af Amer 36 (*)    GFR calc Af Amer 42 (*)    All other components within normal limits  CBC - Abnormal; Notable for the following components:   MCV 101.0 (*)    All other components within normal limits  URINALYSIS, ROUTINE W REFLEX MICROSCOPIC    EKG None  Radiology No results found.  Procedures Procedures (including critical care time)  Medications Ordered in ED Medications - No data to display   Initial Impression / Assessment and Plan / ED Course  I have reviewed the triage vital signs and the nursing notes.  Pertinent labs & imaging results  that were available during my care of the  patient were reviewed by me and considered in my medical decision making (see chart for details).     Patient here with her son for evaluation of multiple complaints: nonspecific, intermittent headache and lightheadedness, asymptomatic currently; intermittent chest pain that is better with her NTG, asymptomatic currently with last pain one week ago; urinary frequency. All symptoms have been ongoing for 3-4 weeks.   She is very comfortable appearing, VSS. Labs remarkable for Cr 1.42 (.88 on 07/06/18), normal BUN, in the setting of concentrated urine, likelly dehydration component, fluids provided; her troponin is 0.1 - has been consistently elevated in the past, cardiac cath 12/18 with no obstructive CAD, "likely demand ischemia", no CP currently. Doubt ACS; nonspecific headache, report of slurred speech (none noted on exam here), normal neuro exam, no current headache, normal (nonacute Head CT), doubt HA represents acute/subacute infarct or bleed.   The patient states she feel significantly improved while here. Stressed the importance of PCP follow up for recheck of what labs were abnormal. VSS. She can be discharged home. Patient is comfortable with plan of discharge.  Final Clinical Impressions(s) / ED Diagnoses   Final diagnoses:  None   1. Nonspecific headache 2. Stable angina 3. Mild dehydration 4. Urinary frequency  ED Discharge Orders    None       Elpidio Anis, PA-C 07/11/18 0344    Cathren Laine, MD 07/13/18 1547

## 2018-07-10 NOTE — ED Notes (Signed)
ED Provider at bedside. 

## 2018-07-10 NOTE — ED Notes (Signed)
Patient transported to CT 

## 2018-07-10 NOTE — ED Triage Notes (Signed)
Pt c/o headache and dizziness x 3-[redacted] weeks along with generalized joint pain. Pt also reports urinary frequency.

## 2018-07-10 NOTE — ED Notes (Signed)
Pt stating she needed to use the restroom. Pt given urine cup to collect sample. Upon exiting restroom pt stated she missed the cup. Will attempt to collect later.

## 2018-07-11 DIAGNOSIS — R350 Frequency of micturition: Secondary | ICD-10-CM | POA: Diagnosis not present

## 2018-07-11 DIAGNOSIS — I10 Essential (primary) hypertension: Secondary | ICD-10-CM | POA: Diagnosis not present

## 2018-07-11 DIAGNOSIS — R51 Headache: Secondary | ICD-10-CM | POA: Diagnosis not present

## 2018-07-11 DIAGNOSIS — I209 Angina pectoris, unspecified: Secondary | ICD-10-CM | POA: Diagnosis not present

## 2018-07-11 DIAGNOSIS — E860 Dehydration: Secondary | ICD-10-CM | POA: Diagnosis not present

## 2018-07-11 DIAGNOSIS — Z79899 Other long term (current) drug therapy: Secondary | ICD-10-CM | POA: Diagnosis not present

## 2018-07-11 DIAGNOSIS — Z7982 Long term (current) use of aspirin: Secondary | ICD-10-CM | POA: Diagnosis not present

## 2018-07-11 DIAGNOSIS — R42 Dizziness and giddiness: Secondary | ICD-10-CM | POA: Diagnosis not present

## 2018-07-11 DIAGNOSIS — E039 Hypothyroidism, unspecified: Secondary | ICD-10-CM | POA: Diagnosis not present

## 2018-07-11 LAB — URINALYSIS, ROUTINE W REFLEX MICROSCOPIC
BILIRUBIN URINE: NEGATIVE
Glucose, UA: NEGATIVE mg/dL
HGB URINE DIPSTICK: NEGATIVE
Ketones, ur: 5 mg/dL — AB
Leukocytes, UA: NEGATIVE
Nitrite: NEGATIVE
PROTEIN: 30 mg/dL — AB
Specific Gravity, Urine: 1.025 (ref 1.005–1.030)
pH: 5 (ref 5.0–8.0)

## 2018-07-11 LAB — HEPATIC FUNCTION PANEL
ALK PHOS: 74 U/L (ref 38–126)
ALT: 33 U/L (ref 0–44)
AST: 26 U/L (ref 15–41)
Albumin: 3.1 g/dL — ABNORMAL LOW (ref 3.5–5.0)
BILIRUBIN INDIRECT: 1.3 mg/dL — AB (ref 0.3–0.9)
BILIRUBIN TOTAL: 1.6 mg/dL — AB (ref 0.3–1.2)
Bilirubin, Direct: 0.3 mg/dL — ABNORMAL HIGH (ref 0.0–0.2)
Total Protein: 6.4 g/dL — ABNORMAL LOW (ref 6.5–8.1)

## 2018-07-11 LAB — TROPONIN I: TROPONIN I: 0.14 ng/mL — AB (ref <0.03)

## 2018-07-11 MED ORDER — ACETAMINOPHEN 325 MG PO TABS
650.0000 mg | ORAL_TABLET | Freq: Once | ORAL | Status: AC
  Administered 2018-07-11: 650 mg via ORAL
  Filled 2018-07-11: qty 2

## 2018-07-11 NOTE — Discharge Instructions (Signed)
Follow up with Dr. Durene CalHunter for recheck of your lab studies to insure they return to normal (kidney and liver). Take NO MORE than 4000 mg Tylenol in any 24 hour period (that's 2 extra strength Tylenol no more than every 6 hours) as this can affect your liver. Drink plenty of fluids without caffeine (water, juice). Return to the ED as needed.

## 2018-07-11 NOTE — ED Notes (Signed)
ED Provider at bedside. 

## 2018-07-11 NOTE — ED Notes (Signed)
Called Melissa in lab to verify that they received specimens for troponin and hepatic function. Never received a specimen, per Melissa. Will redraw.

## 2018-07-11 NOTE — ED Notes (Signed)
Pt verbalized understanding of discharge instructions including same amounts of Tylenol to take.  Given crackers and [place] upon leaving.

## 2018-07-16 ENCOUNTER — Other Ambulatory Visit: Payer: Self-pay | Admitting: Family Medicine

## 2018-07-20 ENCOUNTER — Encounter: Payer: Self-pay | Admitting: Endocrinology

## 2018-07-20 ENCOUNTER — Ambulatory Visit (INDEPENDENT_AMBULATORY_CARE_PROVIDER_SITE_OTHER): Payer: Medicare HMO | Admitting: Endocrinology

## 2018-07-20 VITALS — BP 118/56 | HR 56 | Ht 66.0 in | Wt 124.8 lb

## 2018-07-20 DIAGNOSIS — E0590 Thyrotoxicosis, unspecified without thyrotoxic crisis or storm: Secondary | ICD-10-CM | POA: Diagnosis not present

## 2018-07-20 LAB — TSH

## 2018-07-20 LAB — T4, FREE: Free T4: 1.99 ng/dL — ABNORMAL HIGH (ref 0.60–1.60)

## 2018-07-20 MED ORDER — METHIMAZOLE 10 MG PO TABS: ORAL_TABLET | ORAL | 5 refills | Status: DC

## 2018-07-20 NOTE — Patient Instructions (Addendum)
blood tests are requested for you today.  We'll let you know about the results. If ever you have fever while taking methimazole, stop it and call us, even if the reason is obvious, because of the risk of a rare side-effect. Please come back for a follow-up appointment in 1 month.  Please consider the radioactive iodine pill (insteady of the methimazole).  It works like this:  We would first check a thyroid "scan" (a special, but easy and painless type of thyroid x ray).  you go to the x-ray department of the hospital to swallow a pill, which contains a miniscule amount of radiation.  You will not notice any symptoms from this.  You will go back to the x-ray department the next day, to lie down in front of a camera.  The results of this will be sent to me.   Based on the results, i hope to order for you a treatment pill of radioactive iodine.  Although it is a larger amount of radiation, you will again notice no symptoms from this.  The pill is gone from your body in a few days (during which you should stay away from other people), but takes several months to work.  Therefore, please return here approximately 6-8 weeks after the treatment.  This treatment has been available for many years, and the only known side-effect is an underactive thyroid.  It is possible that i would eventually prescribe for you a thyroid hormone pill, which is very inexpensive.  You don't have to worry about side-effects of this thyroid hormone pill, because it is the same molecule your thyroid makes.    Radioiodine (I-131) Therapy for Hyperthyroidism Radioiodine (I-131) therapy is a procedure to treat an overactive thyroid gland (hyperthyroidism). The thyroid is a gland in the neck that uses iodine to help control how the body uses food (metabolism). In this procedure, you swallow a pill or liquid that contains I-131. I-131 is manufactured (synthetic) iodine that gives off radiation. This destroys thyroid cells and reverses  hyperthyroidism. Tell a health care provider about:  Any allergies you have.  All medicines you are taking, including vitamins, herbs, eye drops, creams, and over-the-counter medicines.  Any problems you or family members have had with anesthetic medicines.  Any blood disorders you have.  Any surgeries you have had.  Any medical conditions you have.  Whether you are pregnant, may be pregnant, or have gone through menopause, if this applies.  Whether you currently have children.  Whether you plan to have children in the next 2 years.  Any contact you have with children or pregnant women.  Your travel plans for the next 3 months.  Whether you pass through radiation detectors for work or travel. What are the risks? Generally, this is a safe procedure. However, problems may occur, including:  Damage to other structures or organs, such as the salivary glands. This could lead to dry mouth and loss of taste.  Low sperm count, if this applies. This may lead to temporary infertility.  Sore throat or neck pain. This is temporary.  Slightlyincreased risk of thyroid cancer.  Nausea or vomiting.  What happens before the procedure?  Ask your health care provider about changing or stopping your regular medicines. This is especially important if you are taking diabetes medicines, blood thinners, or thyroid medicines.  Women may be asked to take a pregnancy test.  Women who are breastfeeding should plan to stop at least 6 weeks before the procedure.  Follow  instructions from your health care provider about eating or drinking restrictions.  Plan to avoid contact with others for 1 week after your treatment. It is most important to avoid contact with children and pregnant women. To do this, plan to stay home from work, arrange child care, and sleep alone, if these things apply to you.  Plan to drive yourself home after treatment. Do not take public transportation. If you need someone  to drive you home, sit as far away from the driver as possible. What happens during the procedure?  You will be given a dose of I-131 to swallow. It may be a pill or a liquid.  Your thyroid gland will absorb the I-131 over the next 3 months. The treatment process will be complete in about 6 months. What happens after the procedure?  You may need to stay in the hospital for 24 hours after your treatment. This depends on the requirements in your state.  Follow instructions from your health care provider about: ? How to take care of yourself after the procedure. ? How to protect others from exposure to radiation as it leaves your body. This information is not intended to replace advice given to you by your health care provider. Make sure you discuss any questions you have with your health care provider. Document Released: 02/28/2009 Document Revised: 03/17/2016 Document Reviewed: 02/06/2015 Elsevier Interactive Patient Education  [employer].

## 2018-07-20 NOTE — Progress Notes (Signed)
Subjective:    Patient ID: [name], female    DOB: 02/19/1946, 72 y.o.   MRN: [id number]  HPI Pt returns for f/u of hyperthyroidism (dx'ed 2012; US was c/w Grave's Dz; tapazole was chosen as rx, due to 2018 MI).  She then takes tapazole, 10 mg BID, but she was sometimes missing.  She says she has not missed for the past 2 weeks.  She has slight tremor of the hands, and assoc weakness.   Past Medical History:  Diagnosis Date  . Arthritis   . Chronic lower back pain   . Cluster headache   . Coronary artery disease    a. 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 non obs dz LHC 2007. c. Non obs [facility]-mod) [facility] 05/17/14 d. cath 05/26/17 mild non obstructive CAD  . Diverticulitis    a. Hx microperf 2012 - hospitalizated.  Marland Kitchen. GERD (gastroesophageal reflux disease)   . Hypercholesteremia   . Hypertension   . PVC's (premature ventricular contractions)    a. Hx of trigeminy/bigeminy.  . Seizures (HCC)   . Thyroid disease    hyperthyroid  . UTI (lower urinary tract infection)     Past Surgical History:  Procedure Laterality Date  . ABDOMINAL HYSTERECTOMY  1987   "partial"-still has ovaries  . CARDIAC CATHETERIZATION  05/17/2014  . CORONARY ANGIOPLASTY WITH STENT PLACEMENT  1995   "1"  . JOINT REPLACEMENT     right knee  . LEFT HEART CATH AND CORONARY ANGIOGRAPHY N/A 05/26/2017   Procedure: Left Heart Cath and Coronary Angiography;  Surgeon: Corky CraftsVaranasi, Jayadeep S, MD;  [facility];  Service: Cardiovascular;  Laterality: N/A;  . LEFT HEART CATHETERIZATION WITH CORONARY ANGIOGRAM N/A 05/17/2014   Procedure: LEFT HEART CATHETERIZATION WITH CORONARY ANGIOGRAM;  Surgeon: Marykay Lexavid W Harding, MD;  [facility];  Service: Cardiovascular;  Laterality: N/A;  . TOTAL KNEE ARTHROPLASTY Right 2005  . TUBAL LIGATION  1970  . VASCULAR SURGERY      Social History   Socioeconomic History  . Marital status: Married    Spouse name: Not on file  . Number of children: 4  . Years of  education: 4  . Highest education level: Not on file  Occupational History    Comment: retired  Engineer, productionocial Needs  . Financial resource strain: Not on file  . Food insecurity:    Worry: Not on file    Inability: Not on file  . Transportation needs:    Medical: Not on file    Non-medical: Not on file  Tobacco Use  . Smoking status: Never Smoker  . Smokeless tobacco: Never Used  Substance and Sexual Activity  . Alcohol use: No  . Drug use: No  . Sexual activity: Never  Lifestyle  . Physical activity:    Days per week: Not on file    Minutes per session: Not on file  . Stress: Not on file  Relationships  . Social connections:    Talks on phone: Not on file    Gets together: Not on file    Attends religious service: Not on file    Active member of club or organization: Not on file    Attends meetings of clubs or organizations: Not on file    Relationship status: Not on file  . Intimate partner violence:    Fear of current or ex partner: Not on file    Emotionally abused: Not on file    Physically abused: Not on  file    Forced sexual activity: Not on file  Other Topics Concern  . Not on file  Social History Narrative   Separated. 4 children from first marriage. 16 grandkids.       Retired from [employer] for 25 years, went to [employer] for 5 years. Retired 2003 after MI      Hobbies: babysit/active with children      Right-handed      Caffeine: 24 oz soda per day       Current Outpatient Medications on File Prior to Visit  Medication Sig Dispense Refill  . acetaminophen (TYLENOL) 325 MG tablet Take 650 mg by mouth every 6 (six) hours as needed for moderate pain.     Marland Kitchen amLODipine (NORVASC) 10 MG tablet TAKE 1 TABLET BY MOUTH EVERY DAY 30 tablet 2  . aspirin 325 MG EC tablet Take 325 mg by mouth every other day.    Marland Kitchen aspirin EC 81 MG tablet Take 81 mg daily by mouth.    Marland Kitchen atorvastatin (LIPITOR) 20 MG tablet TAKE 1 TABLET BY MOUTH ONCE DAILY 90 tablet 1  . B Complex Vitamins  (VITAMIN B COMPLEX) TABS Take 1 tablet by mouth daily.    . cholecalciferol (VITAMIN D) 400 UNITS TABS tablet Take 400 Units daily by mouth.     . Cyanocobalamin (VITAMIN B 12 PO) Take 1 capsule daily by mouth.     . esomeprazole (NEXIUM) 40 MG capsule Take 1 capsule (40 mg total) by mouth daily. 90 capsule 3  . ezetimibe (ZETIA) 10 MG tablet TAKE 1 TABLET BY MOUTH ONCE DAILY 90 tablet 1  . feeding supplement, ENSURE ENLIVE, (ENSURE ENLIVE) LIQD Take 237 mLs by mouth 2 (two) times daily between meals. 237 mL 12  . Ferrous Sulfate Dried (EQ SLOW-RELEASE IRON) 45 MG TBCR Take 1 tablet by mouth daily. 90 tablet 1  . Ibuprofen-diphenhydrAMINE Cit (ADVIL PM) 200-38 MG TABS Take 1 tablet by mouth as needed.    . isosorbide mononitrate (IMDUR) 60 MG 24 hr tablet TAKE 2 TABLETS BY MOUTH ONCE DAILY 180 tablet 2  . lisinopril (PRINIVIL,ZESTRIL) 20 MG tablet TAKE 1 TABLET BY MOUTH ONCE DAILY 90 tablet 0  . loperamide (IMODIUM) 2 MG capsule Take 1 capsule (2 mg total) by mouth as needed for diarrhea or loose stools. 30 capsule 0  . nitroGLYCERIN (NITROSTAT) 0.4 MG SL tablet PLACE 1 TABLET UNDER THE TONGUE EVERY 5 MINUTES FOR CHEST PAIN FOR 3 DOSES 25 tablet 1  . oxybutynin (DITROPAN-XL) 10 MG 24 hr tablet Take 1 tablet (10 mg total) by mouth daily. 90 tablet 3  . oxyCODONE-acetaminophen (PERCOCET) 7.5-325 MG tablet as needed.  0  . potassium chloride (KLOR-CON 10) 10 MEQ tablet Take 1 tablet (10 mEq total) by mouth 2 (two) times daily. 60 tablet 5  . potassium chloride (MICRO-K) 10 MEQ CR capsule TAKE 1 CAPSULE BY MOUTH TWICE DAILY 180 capsule 2  . propranolol (INDERAL) 60 MG tablet TAKE 1 TABLET BY MOUTH TWICE DAILY 180 tablet 0  . traMADol (ULTRAM) 50 MG tablet Take 1-2 tablets (50-100 mg total) by mouth every 6 (six) hours as needed for moderate pain. 30 tablet 0  . venlafaxine XR (EFFEXOR-XR) 150 MG 24 hr capsule TAKE ONE CAPSULE BY MOUTH EVERY NIGHT AT BEDTIME 90 capsule 1  . vitamin C (ASCORBIC ACID)  500 MG tablet Take 500 mg daily by mouth.     . vitamin E (VITAMIN E) 1000 UNIT capsule Take 1,000 Units  daily by mouth.      No current facility-administered medications on file prior to visit.     Allergies  Allergen Reactions  . Sulfa Drugs Cross Reactors Shortness Of Breath and Palpitations    Family History  Problem Relation Age of Onset  . CAD Brother   . Diabetes Brother   . CAD Father   . Diabetes Father   . Diabetes Mother        father, sister, brothers  . Diabetes Sister   . Thyroid disease Sister     BP (!) 118/56   Pulse (!) 56   Ht 5\' 6"  (1.676 m)   Wt 124 lb 12.8 oz (56.6 kg)   SpO2 98%   BMI 20.14 kg/m    Review of Systems Denies fever and tremor.      Objective:   Physical Exam VITAL SIGNS:  See vs page GENERAL: no distress NECK: thyroid is 5-10 x normal size, diffuse.   SKIN: not diaphoretic.   NEURO: no tremor.        Assessment & Plan:  Hypercalcemia: new, prob due to hyperthyroidism.  We'll follow.  Hyperthyroidism: we discussed rx options.  she declines RAI, at least for now Noncompliance with meds.  w discussed.  Better now   Patient Instructions  blood tests are requested for you today.  We'll let you know about the results. If ever you have fever while taking methimazole, stop it and call us, even if the reason is obvious, because of the risk of a rare side-effect. Please come back for a follow-up appointment in 1 month.  Please consider the radioactive iodine pill (insteady of the methimazole).  It works like this:  We would first check a thyroid "scan" (a special, but easy and painless type of thyroid x ray).  you go to the x-ray department of the hospital to swallow a pill, which contains a miniscule amount of radiation.  You will not notice any symptoms from this.  You will go back to the x-ray department the next day, to lie down in front of a camera.  The results of this will be sent to me.   Based on the results, i hope to  order for you a treatment pill of radioactive iodine.  Although it is a larger amount of radiation, you will again notice no symptoms from this.  The pill is gone from your body in a few days (during which you should stay away from other people), but takes several months to work.  Therefore, please return here approximately 6-8 weeks after the treatment.  This treatment has been available for many years, and the only known side-effect is an underactive thyroid.  It is possible that i would eventually prescribe for you a thyroid hormone pill, which is very inexpensive.  You don't have to worry about side-effects of this thyroid hormone pill, because it is the same molecule your thyroid makes.    Radioiodine (I-131) Therapy for Hyperthyroidism Radioiodine (I-131) therapy is a procedure to treat an overactive thyroid gland (hyperthyroidism). The thyroid is a gland in the neck that uses iodine to help control how the body uses food (metabolism). In this procedure, you swallow a pill or liquid that contains I-131. I-131 is manufactured (synthetic) iodine that gives off radiation. This destroys thyroid cells and reverses hyperthyroidism. Tell a health care provider about:  Any allergies you have.  All medicines you are taking, including vitamins, herbs, eye drops, creams, and over-the-counter medicines.  Any  problems you or family members have had with anesthetic medicines.  Any blood disorders you have.  Any surgeries you have had.  Any medical conditions you have.  Whether you are pregnant, may be pregnant, or have gone through menopause, if this applies.  Whether you currently have children.  Whether you plan to have children in the next 2 years.  Any contact you have with children or pregnant women.  Your travel plans for the next 3 months.  Whether you pass through radiation detectors for work or travel. What are the risks? Generally, this is a safe procedure. However, problems may  occur, including:  Damage to other structures or organs, such as the salivary glands. This could lead to dry mouth and loss of taste.  Low sperm count, if this applies. This may lead to temporary infertility.  Sore throat or neck pain. This is temporary.  Slightlyincreased risk of thyroid cancer.  Nausea or vomiting.  What happens before the procedure?  Ask your health care provider about changing or stopping your regular medicines. This is especially important if you are taking diabetes medicines, blood thinners, or thyroid medicines.  Women may be asked to take a pregnancy test.  Women who are breastfeeding should plan to stop at least 6 weeks before the procedure.  Follow instructions from your health care provider about eating or drinking restrictions.  Plan to avoid contact with others for 1 week after your treatment. It is most important to avoid contact with children and pregnant women. To do this, plan to stay home from work, arrange child care, and sleep alone, if these things apply to you.  Plan to drive yourself home after treatment. Do not take public transportation. If you need someone to drive you home, sit as far away from the driver as possible. What happens during the procedure?  You will be given a dose of I-131 to swallow. It may be a pill or a liquid.  Your thyroid gland will absorb the I-131 over the next 3 months. The treatment process will be complete in about 6 months. What happens after the procedure?  You may need to stay in the hospital for 24 hours after your treatment. This depends on the requirements in your state.  Follow instructions from your health care provider about: ? How to take care of yourself after the procedure. ? How to protect others from exposure to radiation as it leaves your body. This information is not intended to replace advice given to you by your health care provider. Make sure you discuss any questions you have with your  health care provider. Document Released: 02/28/2009 Document Revised: 03/17/2016 Document Reviewed: 02/06/2015 Elsevier Interactive Patient Education  [employer].

## 2018-07-21 ENCOUNTER — Other Ambulatory Visit (INDEPENDENT_AMBULATORY_CARE_PROVIDER_SITE_OTHER): Payer: Medicare HMO

## 2018-07-21 DIAGNOSIS — R17 Unspecified jaundice: Secondary | ICD-10-CM | POA: Diagnosis not present

## 2018-07-21 LAB — COMPREHENSIVE METABOLIC PANEL
ALT: 12 U/L (ref 0–35)
AST: 14 U/L (ref 0–37)
Albumin: 3.4 g/dL — ABNORMAL LOW (ref 3.5–5.2)
Alkaline Phosphatase: 72 U/L (ref 39–117)
BILIRUBIN TOTAL: 0.5 mg/dL (ref 0.2–1.2)
BUN: 25 mg/dL — AB (ref 6–23)
CALCIUM: 9.9 mg/dL (ref 8.4–10.5)
CO2: 28 meq/L (ref 19–32)
Chloride: 103 mEq/L (ref 96–112)
Creatinine, Ser: 1.01 mg/dL (ref 0.40–1.20)
GFR: 69.34 mL/min (ref >60.00)
Glucose, Bld: 85 mg/dL (ref 70–99)
Potassium: 4.6 mEq/L (ref 3.5–5.1)
Sodium: 136 mEq/L (ref 135–145)
Total Protein: 6.7 g/dL (ref 6.0–8.3)

## 2018-08-10 ENCOUNTER — Other Ambulatory Visit: Payer: Self-pay | Admitting: Family Medicine

## 2018-08-16 ENCOUNTER — Other Ambulatory Visit: Payer: Self-pay | Admitting: Family Medicine

## 2018-08-19 ENCOUNTER — Ambulatory Visit: Payer: Medicare HMO | Admitting: Endocrinology

## 2018-08-31 ENCOUNTER — Other Ambulatory Visit: Payer: Self-pay | Admitting: Family Medicine

## 2018-09-02 ENCOUNTER — Other Ambulatory Visit: Payer: Self-pay | Admitting: Family Medicine

## 2018-10-07 ENCOUNTER — Other Ambulatory Visit: Payer: Self-pay | Admitting: Family Medicine

## 2018-11-24 ENCOUNTER — Ambulatory Visit: Payer: Medicare HMO | Admitting: Family Medicine

## 2018-11-25 ENCOUNTER — Other Ambulatory Visit: Payer: Self-pay | Admitting: Family Medicine

## 2018-12-01 ENCOUNTER — Encounter (HOSPITAL_COMMUNITY): Payer: Self-pay

## 2018-12-01 ENCOUNTER — Emergency Department (HOSPITAL_COMMUNITY)
Admission: EM | Admit: 2018-12-01 | Discharge: 2018-12-01 | Disposition: A | Discharge status: Home / Self Care | Payer: Medicare Other | Attending: Emergency Medicine | Admitting: Emergency Medicine

## 2018-12-01 ENCOUNTER — Other Ambulatory Visit: Payer: Self-pay

## 2018-12-01 DIAGNOSIS — Z7982 Long term (current) use of aspirin: Secondary | ICD-10-CM | POA: Diagnosis not present

## 2018-12-01 DIAGNOSIS — I10 Essential (primary) hypertension: Secondary | ICD-10-CM

## 2018-12-01 DIAGNOSIS — Z79899 Other long term (current) drug therapy: Secondary | ICD-10-CM | POA: Insufficient documentation

## 2018-12-01 DIAGNOSIS — R519 Headache, unspecified: Secondary | ICD-10-CM

## 2018-12-01 DIAGNOSIS — R350 Frequency of micturition: Secondary | ICD-10-CM | POA: Insufficient documentation

## 2018-12-01 DIAGNOSIS — Z955 Presence of coronary angioplasty implant and graft: Secondary | ICD-10-CM | POA: Insufficient documentation

## 2018-12-01 DIAGNOSIS — I2510 Atherosclerotic heart disease of native coronary artery without angina pectoris: Secondary | ICD-10-CM | POA: Insufficient documentation

## 2018-12-01 DIAGNOSIS — Z96651 Presence of right artificial knee joint: Secondary | ICD-10-CM | POA: Diagnosis not present

## 2018-12-01 DIAGNOSIS — R51 Headache: Secondary | ICD-10-CM | POA: Insufficient documentation

## 2018-12-01 LAB — URINALYSIS, ROUTINE W REFLEX MICROSCOPIC
BILIRUBIN URINE: NEGATIVE
Glucose, UA: NEGATIVE mg/dL
HGB URINE DIPSTICK: NEGATIVE
Ketones, ur: NEGATIVE mg/dL
Leukocytes, UA: NEGATIVE
NITRITE: NEGATIVE
Protein, ur: NEGATIVE mg/dL
SPECIFIC GRAVITY, URINE: 1.011 (ref 1.005–1.030)
pH: 7 (ref 5.0–8.0)

## 2018-12-01 MED ORDER — ONDANSETRON 4 MG PO TBDP
4.0000 mg | ORAL_TABLET | Freq: Once | ORAL | Status: AC
  Administered 2018-12-01: 4 mg via ORAL
  Filled 2018-12-01: qty 1

## 2018-12-01 MED ORDER — OXYCODONE-ACETAMINOPHEN 5-325 MG PO TABS
1.0000 | ORAL_TABLET | Freq: Once | ORAL | Status: AC
  Administered 2018-12-01: 1 via ORAL
  Filled 2018-12-01: qty 1

## 2018-12-01 MED ORDER — ONDANSETRON HCL 4 MG/2ML IJ SOLN: 4.0000 mg | Freq: Once | INTRAMUSCULAR | Status: DC

## 2018-12-01 NOTE — ED Notes (Signed)
Walked patient to the bathroom patient did well patient is now back in bed call bell in reach

## 2018-12-01 NOTE — ED Provider Notes (Signed)
[facility] EMERGENCY DEPARTMENT Provider Note   CSN: [id number] Arrival date & time: 12/01/18  0741     History   Chief Complaint Chief Complaint  Patient presents with  . headache/nausea    HPI [name] is a 73 y.o. female.  Patient presents c/o dull headache, onset in past day. Gradual onset, moderate, persistent. Nausea, no vomiting. Pt indicates has history frequent headaches. Denies trauma, fall or head injury. No neck pain or stiffness. No eye pain or change in vision. No numbness/weakness, or change in normal function. No problems w balance or coordination. Denies sinus drainage or congestion. No fever or chills. Also notes urinary frequency,? dysuria. No abd or flank pain.   The history is provided by the patient.    Past Medical History:  Diagnosis Date  . Arthritis   . Chronic lower back pain   . Cluster headache   . Coronary artery disease    a. 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 non obs dz LHC 2007. c. Non obs dz (mild-mod) by [facility] 05/17/14 d. cath 05/26/17 mild non obstructive CAD  . Diverticulitis    a. Hx microperf 2012 - hospitalizated.  Marland Kitchen GERD (gastroesophageal reflux disease)   . Hypercholesteremia   . Hypertension   . PVC's (premature ventricular contractions)    a. Hx of trigeminy/bigeminy.  . Seizures (HCC)   . Thyroid disease    hyperthyroid  . UTI (lower urinary tract infection)     Patient Active Problem List   Diagnosis Date Noted  . Anemia 12/22/2017  . Demand ischemia (HCC)   . Hyperthyroidism 05/22/2017  . Dark stools 05/22/2017  . Protein-calorie malnutrition, moderate (HCC) 05/22/2017  . Overactive bladder 09/19/2014  . Coronary artery disease involving native coronary artery of native heart with angina pectoris (HCC)   . Seizures (HCC)   . Arthritis   . Hypercholesteremia   . GERD (gastroesophageal reflux disease) 10/04/2011  . HTN (hypertension) 10/01/2011    Past Surgical History:  Procedure Laterality Date  .  ABDOMINAL HYSTERECTOMY  1987   "partial"-still has ovaries  . CARDIAC CATHETERIZATION  05/17/2014  . CORONARY ANGIOPLASTY WITH STENT PLACEMENT  1995   "1"  . JOINT REPLACEMENT     right knee  . LEFT HEART CATH AND CORONARY ANGIOGRAPHY N/A 05/26/2017   Procedure: Left Heart Cath and Coronary Angiography;  Surgeon: Corky Crafts, MD;  Location: [facility] INVASIVE CV LAB;  Service: Cardiovascular;  Laterality: N/A;  . LEFT HEART CATHETERIZATION WITH CORONARY ANGIOGRAM N/A 05/17/2014   Procedure: LEFT HEART CATHETERIZATION WITH CORONARY ANGIOGRAM;  Surgeon: Marykay Lex, MD;  Location: [facility] CATH LAB;  Service: Cardiovascular;  Laterality: N/A;  . TOTAL KNEE ARTHROPLASTY Right 2005  . TUBAL LIGATION  1970  . VASCULAR SURGERY       OB History   No obstetric history on file.      Home Medications    Prior to Admission medications   Medication Sig Start Date End Date Taking? Authorizing Provider  acetaminophen (TYLENOL) 325 MG tablet Take 650 mg by mouth every 6 (six) hours as needed for moderate pain.     [provider]  amLODipine (NORVASC) 10 MG tablet TAKE 1 TABLET BY MOUTH EVERY DAY 11/29/18   Shelva Majestic, MD  aspirin 325 MG EC tablet Take 325 mg by mouth every other day.    [provider]  aspirin EC 81 MG tablet Take 81 mg daily by mouth.    [provider]  atorvastatin (LIPITOR) 20 MG tablet TAKE 1 TABLET BY MOUTH EVERY DAY 09/01/18   Shelva Majestic, MD  B Complex Vitamins (VITAMIN B COMPLEX) TABS Take 1 tablet by mouth daily.    [provider]  cholecalciferol (VITAMIN D) 400 UNITS TABS tablet Take 400 Units daily by mouth.     [provider]  Cyanocobalamin (VITAMIN B 12 PO) Take 1 capsule daily by mouth.     [provider]  esomeprazole (NEXIUM) 40 MG capsule Take 1 capsule (40 mg total) by mouth daily. 11/26/17   Shelva Majestic, MD  ezetimibe (ZETIA) 10 MG tablet TAKE 1 TABLET BY MOUTH ONCE DAILY 05/11/18   Shelva Majestic, MD  feeding supplement, ENSURE ENLIVE, (ENSURE ENLIVE) LIQD Take 237 mLs by mouth 2 (two) times daily between meals. 05/25/17   Richarda Overlie, MD  Ferrous Sulfate Dried (EQ SLOW-RELEASE IRON) 45 MG TBCR Take 1 tablet by mouth daily. 01/21/18   Jarold Motto, PA  Ibuprofen-diphenhydrAMINE Cit (ADVIL PM) 200-38 MG TABS Take 1 tablet by mouth as needed.    [provider]  isosorbide mononitrate (IMDUR) 60 MG 24 hr tablet TAKE 2 TABLETS BY MOUTH ONCE DAILY 02/10/18   Shelva Majestic, MD  lisinopril (PRINIVIL,ZESTRIL) 20 MG tablet TAKE 1 TABLET BY MOUTH EVERY DAY 10/10/18   Shelva Majestic, MD  loperamide (IMODIUM) 2 MG capsule Take 1 capsule (2 mg total) by mouth as needed for diarrhea or loose stools. 10/04/17   Regalado, Jon Billings A, MD  methimazole (TAPAZOLE) 10 MG tablet 2 pills each morning, and 1 pill each evening 07/20/18   Romero Belling, MD  nitroGLYCERIN (NITROSTAT) 0.4 MG SL tablet PLACE 1 TABLET UNDER THE TONGUE EVERY 5 MINUTES FOR CHEST PAIN FOR 3 DOSES 08/10/18   Shelva Majestic, MD  oxybutynin (DITROPAN-XL) 10 MG 24 hr tablet Take 1 tablet (10 mg total) by mouth daily. 11/26/17   Shelva Majestic, MD  oxyCODONE-acetaminophen (PERCOCET) 7.5-325 MG tablet as needed. 02/21/18   [provider]  potassium chloride (KLOR-CON 10) 10 MEQ tablet Take 1 tablet (10 mEq total) by mouth 2 (two) times daily. 06/04/17   Shelva Majestic, MD  potassium chloride (MICRO-K) 10 MEQ CR capsule TAKE 1 CAPSULE BY MOUTH TWICE DAILY 07/18/18   Shelva Majestic, MD  propranolol (INDERAL) 60 MG tablet TAKE 1 TABLET BY MOUTH TWICE DAILY 06/20/18   Shelva Majestic, MD  propranolol (INDERAL) 60 MG tablet TAKE 1 TABLET BY MOUTH TWICE DAILY 09/05/18   Shelva Majestic, MD  traMADol (ULTRAM) 50 MG tablet Take 1-2 tablets (50-100 mg total) by mouth every 6 (six) hours as needed for moderate pain. 12/22/17   Jarold Motto, PA  venlafaxine XR (EFFEXOR-XR) 150 MG 24 hr capsule TAKE ONE  CAPSULE BY MOUTH EVERY NIGHT AT BEDTIME 11/29/18   Shelva Majestic, MD  vitamin C (ASCORBIC ACID) 500 MG tablet Take 500 mg daily by mouth.     [provider]  vitamin E (VITAMIN E) 1000 UNIT capsule Take 1,000 Units daily by mouth.     [provider]    Family History Family History  Problem Relation Age of Onset  . CAD Brother   . Diabetes Brother   . CAD Father   . Diabetes Father   . Diabetes Mother        father, sister, brothers  . Diabetes Sister   . Thyroid disease Sister     Social History Social History  Tobacco Use  . Smoking status: Never Smoker  . Smokeless tobacco: Never Used  Substance Use Topics  . Alcohol use: No  . Drug use: No     Allergies   Sulfa drugs cross reactors   Review of Systems Review of Systems  Constitutional: Negative for fever.  HENT: Negative for sinus pain and sore throat.   Eyes: Negative for pain, redness and visual disturbance.  Respiratory: Negative for cough and shortness of breath.   Cardiovascular: Negative for chest pain.  Gastrointestinal: Negative for abdominal pain and vomiting.  Endocrine: Negative for polydipsia.  Genitourinary: Positive for frequency. Negative for flank pain.  Musculoskeletal: Negative for back pain and neck pain.  Skin: Negative for rash.  Neurological: Positive for headaches. Negative for syncope, speech difficulty, weakness and numbness.  Hematological: Does not bruise/bleed easily.  Psychiatric/Behavioral: Negative for confusion.     Physical Exam Updated Vital Signs BP (!) 178/98 (BP Location: Right Arm)   Pulse 61   Temp (!) 97.3 F (36.3 C) (Oral)   Resp 20   SpO2 99%   Physical Exam Vitals signs and nursing note reviewed.  Constitutional:      General: She is not in acute distress.    Appearance: Normal appearance. She is well-developed. She is not diaphoretic.  HENT:     Head: Atraumatic.     Comments: No sinus or temporal tenderness.     Nose: Nose  normal. No congestion.     Mouth/Throat:     Mouth: Mucous membranes are moist.  Eyes:     General: No scleral icterus.    Conjunctiva/sclera: Conjunctivae normal.     Pupils: Pupils are equal, round, and reactive to light.  Neck:     Musculoskeletal: Normal range of motion and neck supple. No neck rigidity or muscular tenderness.     Thyroid: No thyromegaly.     Trachea: No tracheal deviation.     Comments: No stiffness or rigidity.  Cardiovascular:     Rate and Rhythm: Normal rate and regular rhythm.     Pulses: Normal pulses.     Heart sounds: Normal heart sounds. No murmur. No friction rub. No gallop.   Pulmonary:     Effort: Pulmonary effort is normal. No respiratory distress.     Breath sounds: Normal breath sounds.  Abdominal:     General: Bowel sounds are normal. There is no distension.     Palpations: Abdomen is soft.     Tenderness: There is no abdominal tenderness. There is no guarding.  Genitourinary:    Comments: No cva tenderness.  Musculoskeletal: Normal range of motion.        General: No swelling or tenderness.  Skin:    General: Skin is warm and dry.     Findings: No rash.  Neurological:     Mental Status: She is alert and oriented to person, place, and time.     Cranial Nerves: No cranial nerve deficit.     Comments: Alert, speech normal/fluent. Motor intact bil, stre 5/5. No pronator drift. Sensation grossly intact. Steady gait.   Psychiatric:        Mood and Affect: Mood normal.      ED Treatments / Results  Labs (all labs ordered are listed, but only abnormal results are displayed) Results for orders placed or performed during the hospital encounter of 12/01/18  Urinalysis, Routine w reflex microscopic  Result Value Ref Range   Color, Urine STRAW (A) YELLOW   APPearance CLEAR CLEAR  Specific Gravity, Urine 1.011 1.005 - 1.030   pH 7.0 5.0 - 8.0   Glucose, UA NEGATIVE NEGATIVE mg/dL   Hgb urine dipstick NEGATIVE NEGATIVE   Bilirubin Urine  NEGATIVE NEGATIVE   Ketones, ur NEGATIVE NEGATIVE mg/dL   Protein, ur NEGATIVE NEGATIVE mg/dL   Nitrite NEGATIVE NEGATIVE   Leukocytes, UA NEGATIVE NEGATIVE    EKG None  Radiology No results found.  Procedures Procedures (including critical care time)  Medications Ordered in ED Medications  ondansetron (ZOFRAN) injection 4 mg (has no administration in time range)  oxyCODONE-acetaminophen (PERCOCET/ROXICET) 5-325 MG per tablet 1 tablet (has no administration in time range)     Initial Impression / Assessment and Plan / ED Course  I have reviewed the triage vital signs and the nursing notes.  Pertinent labs & imaging results that were available during my care of the patient were reviewed by me and considered in my medical decision making (see chart for details).  Reviewed nursing notes and prior charts for additional history. Charts reviewed - pt noted with 3 prior head cts in past 1.5 years, prior eval for headache - neg acute then.   Patient indicates tried tylenol without relief last night.   zofran po for nausea. Percocet  1 po. Po fluids. Food.   UA sent.   Labs reviewed - ua is negative.  Recheck pt comfortable. No distress. Tolerating po. No vomiting.   Pt currently appears stable for d/c.     Final Clinical Impressions(s) / ED Diagnoses   Final diagnoses:  None    ED Discharge Orders    None       Cathren LaineSteinl, Wasif Simonich, MD 12/01/18 [phone]

## 2018-12-01 NOTE — ED Triage Notes (Signed)
Pt reports waking at 0300 with a headache. States she keeps a headache but states "this is like never before." Also reports having a head cold, states "the headache hurts on the inside so its not a head cold headache." Pt also reports feeling nauseous and feels something is wrong with her kidneys due to she has to get up and "pee every hour"

## 2018-12-01 NOTE — Discharge Instructions (Addendum)
It was our pleasure to provide your ER care today - we hope that you feel better.  Take acetaminophen, ibuprofen, or excedrin as need for headache.   Your blood pressure is high today - continue your blood pressure medication, and follow up with primary care doctor in the next few days for recheck.  Return to ER if worse, new symptoms, fevers, severe pain, persistent vomiting, other concern.  You were given pain medication in the ER - no driving for the next 6 hours.

## 2018-12-20 ENCOUNTER — Other Ambulatory Visit: Payer: Self-pay | Admitting: Family Medicine

## 2018-12-23 ENCOUNTER — Ambulatory Visit: Payer: Self-pay

## 2018-12-23 NOTE — Telephone Encounter (Signed)
Patient called in with c/o "SOB." She says "I was started on a new generic heart medicine 3 weeks ago and 1 week after starting it, I noticed I was having off and on shortness of breath. It's not that bad, but I do feel wiped out." I asked about other symptoms, she says "I have a headache, but I don't think that's related to the pill. I don't have chest pain, but feeling SOB in my chest." According to protocol, see PCP within 3 days, same day slots available on Monday, 12/26/18, patient says she will take the 1620 slot. I called the office and spoke to [place], [facility] who says they will schedule the patient for Monday, 12/26/18 at 1620 and advise her if the SOB gets worse to go to the ED. I advised the patient of the above, care advice given, she verbalized understanding.  Reason for Disposition . [1] MODERATE longstanding difficulty breathing (e.g., speaks in phrases, SOB even at rest, pulse 100-120) AND [2] SAME as normal  Answer Assessment - Initial Assessment Questions 1. RESPIRATORY STATUS: "Describe your breathing?" (e.g., wheezing, shortness of breath, unable to speak, severe coughing)      Shortness of breath 2. ONSET: "When did this breathing problem begin?"      2 weeks ago when I was started on a different generic heart medicine 3. PATTERN "Does the difficult breathing come and go, or has it been constant since it started?"      Come and go 4. SEVERITY: "How bad is your breathing?" (e.g., mild, moderate, severe)    - MILD: No SOB at rest, mild SOB with walking, speaks normally in sentences, can lay down, no retractions, pulse < 100.    - MODERATE: SOB at rest, SOB with minimal exertion and prefers to sit, cannot lie down flat, speaks in phrases, mild retractions, audible wheezing, pulse 100-120.    - SEVERE: Very SOB at rest, speaks in single words, struggling to breathe, sitting hunched forward, retractions, pulse > 120      Mild to moderate 5. RECURRENT SYMPTOM: "Have you had difficulty  breathing before?" If so, ask: "When was the last time?" and "What happened that time?"      Not bad, but feel wiped out 6. CARDIAC HISTORY: "Do you have any history of heart disease?" (e.g., heart attack, angina, bypass surgery, angioplasty)      Yes 7. LUNG HISTORY: "Do you have any history of lung disease?"  (e.g., pulmonary embolus, asthma, emphysema)     No 8. CAUSE: "What do you think is causing the breathing problem?"      A new generic heart medicine 9. OTHER SYMPTOMS: "Do you have any other symptoms? (e.g., dizziness, runny nose, cough, chest pain, fever)     Headache  10. PREGNANCY: "Is there any chance you are pregnant?" "When was your last menstrual period?"       No 11. TRAVEL: "Have you traveled out of the country in the last month?" (e.g., travel history, exposures)       No  Protocols used: BREATHING DIFFICULTY-A-AH

## 2018-12-26 ENCOUNTER — Ambulatory Visit (INDEPENDENT_AMBULATORY_CARE_PROVIDER_SITE_OTHER): Payer: Medicare Other | Admitting: Family Medicine

## 2018-12-26 ENCOUNTER — Encounter: Payer: Self-pay | Admitting: Family Medicine

## 2018-12-26 VITALS — BP 146/98 | HR 64 | Temp 98.2°F | Ht 66.0 in | Wt 137.0 lb

## 2018-12-26 DIAGNOSIS — E0590 Thyrotoxicosis, unspecified without thyrotoxic crisis or storm: Secondary | ICD-10-CM | POA: Diagnosis not present

## 2018-12-26 DIAGNOSIS — I25119 Atherosclerotic heart disease of native coronary artery with unspecified angina pectoris: Secondary | ICD-10-CM

## 2018-12-26 DIAGNOSIS — K219 Gastro-esophageal reflux disease without esophagitis: Secondary | ICD-10-CM | POA: Diagnosis not present

## 2018-12-26 DIAGNOSIS — I10 Essential (primary) hypertension: Secondary | ICD-10-CM | POA: Diagnosis not present

## 2018-12-26 MED ORDER — PROPRANOLOL HCL 60 MG PO TABS: 60.0000 mg | ORAL_TABLET | Freq: Two times a day (BID) | ORAL | 2 refills | Status: DC

## 2018-12-26 MED ORDER — LISINOPRIL 20 MG PO TABS: 20.0000 mg | ORAL_TABLET | Freq: Every day | ORAL | 2 refills | Status: DC

## 2018-12-26 MED ORDER — EZETIMIBE 10 MG PO TABS: 10.0000 mg | ORAL_TABLET | Freq: Every day | ORAL | 2 refills | Status: DC

## 2018-12-26 MED ORDER — ESOMEPRAZOLE MAGNESIUM 40 MG PO CPDR: 40.0000 mg | DELAYED_RELEASE_CAPSULE | Freq: Every day | ORAL | 2 refills | Status: DC

## 2018-12-26 NOTE — Patient Instructions (Addendum)
Health Maintenance Due  Topic Date Due  . MAMMOGRAM Pt declined all HM stated she will have this done when she gets her car working 10/29/1996  . COLONOSCOPY - once gets car fixed 10/29/1996  . TETANUS/TDAP - at pharmacy once gets car fixed 12/24/2005  . DEXA SCAN - once gets car fixed 10/30/2011  . INFLUENZA VACCINE - declined 05/26/2018   I spoke with your pharmacist- they did change manufacturer for your isosorbide mononitrate- he is going to try to special order the old one.   If you cannot get this soon- or you have new or worsening symptoms please seek care immediately.   I want you to restart taking zetia, esomeprazole, lisinopril, and propranolol= I sent these in and it didn't sound like you had a chance to pick these up lately- this could be driving blood pressure up and causing chest discomfort as well.   Schedule a lab visit at the check out desk within a week. Return for future fasting labs meaning nothing but water after midnight please. Ok to take your medications with water.   I also would like to see you in 2-3 weeks to see how you are doing or sooner if needed

## 2018-12-26 NOTE — Assessment & Plan Note (Signed)
S: Patient reports compliant with  methimazole A/P: She is going to come back for labs and we are going to check at [facility] and free T4-likely would need endocrinology follow-up is poorly controlled.  She is not interested in radioactive iodine treatment at present

## 2018-12-26 NOTE — Progress Notes (Signed)
Phone [phone]   Subjective:  [name] is a 73 y.o. year old very pleasant female patient who presents for/with See problem oriented charting ROS- no fever, cough, congestion. Does feel more winded than nomral with activity and has some chest pain with activity.    Past Medical History-  Patient Active Problem List   Diagnosis Date Noted  . Hyperthyroidism 05/22/2017    Priority: High  . Coronary artery disease involving native coronary artery of native heart with angina pectoris [facility])     Priority: High  . Seizures (HCC)     Priority: High  . Protein-calorie malnutrition, moderate (HCC) 05/22/2017    Priority: Medium  . Overactive bladder 09/19/2014    Priority: Medium  . Hypercholesteremia     Priority: Medium  . GERD (gastroesophageal reflux disease) 10/04/2011    Priority: Medium  . HTN (hypertension) 10/01/2011    Priority: Medium  . Arthritis     Priority: Low  . Anemia 12/22/2017  . Demand ischemia (HCC)   . Dark stools 05/22/2017    Medications- reviewed and updated Current Outpatient Medications  Medication Sig Dispense Refill  . amLODipine (NORVASC) 10 MG tablet TAKE 1 TABLET BY MOUTH EVERY DAY 90 tablet 2  . aspirin 325 MG EC tablet Take 325 mg by mouth every other day.    Marland Kitchen atorvastatin (LIPITOR) 20 MG tablet TAKE 1 TABLET BY MOUTH EVERY DAY 90 tablet 1  . B Complex Vitamins (VITAMIN B COMPLEX) TABS Take 1 tablet by mouth daily.    . cholecalciferol (VITAMIN D) 400 UNITS TABS tablet Take 400 Units daily by mouth.     . esomeprazole (NEXIUM) 40 MG capsule Take 1 capsule (40 mg total) by mouth daily. 90 capsule 2  . ezetimibe (ZETIA) 10 MG tablet Take 1 tablet (10 mg total) by mouth daily. 90 tablet 2  . Ferrous Sulfate Dried (EQ SLOW-RELEASE IRON) 45 MG TBCR Take 1 tablet by mouth daily. 90 tablet 1  . isosorbide mononitrate (IMDUR) 60 MG 24 hr tablet TAKE 2 TABLETS BY MOUTH ONCE DAILY 180 tablet 2  . lisinopril (PRINIVIL,ZESTRIL) 20 MG tablet Take 1  tablet (20 mg total) by mouth daily. 90 tablet 2  . loperamide (IMODIUM) 2 MG capsule Take 1 capsule (2 mg total) by mouth as needed for diarrhea or loose stools. 30 capsule 0  . methimazole (TAPAZOLE) 10 MG tablet 2 pills each morning, and 1 pill each evening 90 tablet 5  . nitroGLYCERIN (NITROSTAT) 0.4 MG SL tablet PLACE 1 TABLET UNDER THE TONGUE EVERY 5 MINUTES FOR CHEST PAIN FOR 3 DOSES 25 tablet 0  . oxybutynin (DITROPAN-XL) 10 MG 24 hr tablet Take 1 tablet (10 mg total) by mouth daily. 90 tablet 3  . potassium chloride (MICRO-K) 10 MEQ CR capsule TAKE 1 CAPSULE BY MOUTH TWICE DAILY 180 capsule 2  . propranolol (INDERAL) 60 MG tablet Take 1 tablet (60 mg total) by mouth 2 (two) times daily. 180 tablet 2  . traMADol (ULTRAM) 50 MG tablet Take 1-2 tablets (50-100 mg total) by mouth every 6 (six) hours as needed for moderate pain. 30 tablet 0  . venlafaxine XR (EFFEXOR-XR) 150 MG 24 hr capsule TAKE ONE CAPSULE BY MOUTH EVERY NIGHT AT BEDTIME 90 capsule 1   No current facility-administered medications for this visit.      Objective:  BP (!) 146/98 (BP Location: Left Arm, Patient Position: Sitting, Cuff Size: Normal)   Pulse 64   Temp 98.2 F (36.8 C) (Oral)  Ht  (1.676 m)   Wt 137 lb (62.1 kg)   SpO2 98%   BMI 22.11 kg/m  Gen: NAD, resting comfortably CV: RRR no murmurs rubs or gallops Lungs: CTAB no crackles, wheeze, rhonchi Abdomen: soft/nontender/nondistended Ext: no edema Skin: warm, dry    Assessment and Plan   CAD with angina- MI in 2003 due to vasospasm S: patient started 3 weeks ago with a new isosorbid mononitrate- usually yellow pill- since on white one has noted issues. Feels mildly winded, slightly choking feeling, slight chest pressure. Apparently years ago had a similar issue when had a change in her pills.  A/P: Patient with history of stable angina- seems slightly worse with change in imdur. I spoke with pharmacist who will special order the old  manufacturer-he confirms this was changed to a new manufacturer 3 weeks ago right before symptoms started.  I think given the correlation of symptoms change with change in medication that it is reasonable to try to switch her back before doing further work-up-we discussed if we cannot do the new medicine or symptoms do not improve with switching back- we would need to do further work-up such as x-ray and EKG and likely cardiology follow-up   HTN (hypertension) S: Poorly controlled on Imdur 120 mg daily.  I spoke with the pharmacist and it does not appear she has recently refilled her amlodipine 10 mg, propranolol 60 mg twice a day or lisinopril 20 mg A/P: Patient is going to restart her 3 other blood pressure medications.  I asked her to take her medication list with her to the pharmacy as well as all her current medications for reconciliation-to pick up medicines she is missing  Hyperthyroidism S: Patient reports compliant with  methimazole A/P: She is going to come back for labs and we are going to check at [facility] and free T4-likely would need endocrinology follow-up is poorly controlled.  She is not interested in radioactive iodine treatment at present  GERD (gastroesophageal reflux disease) S: Seems she may have run out of her esomeprazole.  She does report some acid reflux symptoms since being off medication A/P: We will refill esomeprazole-I wonder if this is contributing to her chest discomfort as well  Labs next week, see me 1-2 weeks advised Future Appointments  Date Time Provider Department Center  12/30/2018  8:30 AM [facility]   Lab/Order associations: Hyperthyroidism - Plan: TSH, T4, free  Essential hypertension - Plan: CBC, Comprehensive metabolic panel, Lipid panel  Coronary artery disease involving native coronary artery of native heart with angina pectoris (HCC)  Gastroesophageal reflux disease, esophagitis presence not specified  Meds ordered this encounter    Medications  . propranolol (INDERAL) 60 MG tablet    Sig: Take 1 tablet (60 mg total) by mouth 2 (two) times daily.    Dispense:  180 tablet    Refill:  2  . lisinopril (PRINIVIL,ZESTRIL) 20 MG tablet    Sig: Take 1 tablet (20 mg total) by mouth daily.    Dispense:  90 tablet    Refill:  2  . esomeprazole (NEXIUM) 40 MG capsule    Sig: Take 1 capsule (40 mg total) by mouth daily.    Dispense:  90 capsule    Refill:  2  . ezetimibe (ZETIA) 10 MG tablet    Sig: Take 1 tablet (10 mg total) by mouth daily.    Dispense:  90 tablet    Refill:  2   Return precautions advised.  Specifically discussed if worsening or new symptoms to present to the emergency room immediately. Tana Conch, MD

## 2018-12-26 NOTE — Assessment & Plan Note (Signed)
S: Seems she may have run out of her esomeprazole.  She does report some acid reflux symptoms since being off medication A/P: We will refill esomeprazole-I wonder if this is contributing to her chest discomfort as well

## 2018-12-26 NOTE — Assessment & Plan Note (Signed)
S: Poorly controlled on Imdur 120 mg daily.  I spoke with the pharmacist and it does not appear she has recently refilled her amlodipine 10 mg, propranolol 60 mg twice a day or lisinopril 20 mg A/P: Patient is going to restart her 3 other blood pressure medications.  I asked her to take her medication list with her to the pharmacy as well as all her current medications for reconciliation-to pick up medicines she is missing

## 2018-12-27 DIAGNOSIS — I10 Essential (primary) hypertension: Secondary | ICD-10-CM | POA: Diagnosis not present

## 2018-12-29 ENCOUNTER — Other Ambulatory Visit: Payer: Self-pay | Admitting: Family Medicine

## 2018-12-30 ENCOUNTER — Other Ambulatory Visit (INDEPENDENT_AMBULATORY_CARE_PROVIDER_SITE_OTHER): Payer: Medicare Other

## 2018-12-30 DIAGNOSIS — I10 Essential (primary) hypertension: Secondary | ICD-10-CM

## 2018-12-30 DIAGNOSIS — E0590 Thyrotoxicosis, unspecified without thyrotoxic crisis or storm: Secondary | ICD-10-CM

## 2018-12-30 LAB — CBC
HCT: 40.6 % (ref 36.0–46.0)
Hemoglobin: 13.6 g/dL (ref 12.0–15.0)
MCHC: 33.4 g/dL (ref 30.0–36.0)
MCV: 95.6 fl (ref 78.0–100.0)
PLATELETS: 184 10*3/uL (ref 150.0–400.0)
RBC: 4.25 Mil/uL (ref 3.87–5.11)
RDW: 14.1 % (ref 11.5–15.5)
WBC: 3.4 10*3/uL — ABNORMAL LOW (ref 4.0–10.5)

## 2018-12-30 LAB — LIPID PANEL
Cholesterol: 155 mg/dL (ref 0–200)
HDL: 51.4 mg/dL (ref >39.00)
LDL Cholesterol: 92 mg/dL (ref 0–99)
NonHDL: 103.41
Total CHOL/HDL Ratio: 3
Triglycerides: 56 mg/dL (ref 0.0–149.0)
VLDL: 11.2 mg/dL (ref 0.0–40.0)

## 2018-12-30 LAB — COMPREHENSIVE METABOLIC PANEL
ALT: 19 U/L (ref 0–35)
AST: 18 U/L (ref 0–37)
Albumin: 4.3 g/dL (ref 3.5–5.2)
Alkaline Phosphatase: 113 U/L (ref 39–117)
BUN: 13 mg/dL (ref 6–23)
CO2: 27 mEq/L (ref 19–32)
Calcium: 10 mg/dL (ref 8.4–10.5)
Chloride: 106 mEq/L (ref 96–112)
Creatinine, Ser: 0.95 mg/dL (ref 0.40–1.20)
GFR: 69.93 mL/min (ref >60.00)
Glucose, Bld: 99 mg/dL (ref 70–99)
POTASSIUM: 3.8 meq/L (ref 3.5–5.1)
Sodium: 141 mEq/L (ref 135–145)
Total Bilirubin: 0.9 mg/dL (ref 0.2–1.2)
Total Protein: 7.3 g/dL (ref 6.0–8.3)

## 2018-12-30 LAB — T4, FREE: Free T4: 0.75 ng/dL (ref 0.60–1.60)

## 2018-12-30 LAB — TSH: TSH: 0.69 u[IU]/mL (ref 0.35–4.50)

## 2019-01-02 ENCOUNTER — Emergency Department (HOSPITAL_COMMUNITY): Payer: Medicare Other

## 2019-01-02 ENCOUNTER — Emergency Department (HOSPITAL_COMMUNITY)
Admission: EM | Admit: 2019-01-02 | Discharge: 2019-01-02 | Disposition: A | Discharge status: Home / Self Care | Payer: Medicare Other | Attending: Emergency Medicine | Admitting: Emergency Medicine

## 2019-01-02 ENCOUNTER — Encounter (HOSPITAL_COMMUNITY): Payer: Self-pay | Admitting: *Deleted

## 2019-01-02 ENCOUNTER — Other Ambulatory Visit: Payer: Self-pay

## 2019-01-02 DIAGNOSIS — I2510 Atherosclerotic heart disease of native coronary artery without angina pectoris: Secondary | ICD-10-CM | POA: Diagnosis not present

## 2019-01-02 DIAGNOSIS — H539 Unspecified visual disturbance: Secondary | ICD-10-CM | POA: Diagnosis not present

## 2019-01-02 DIAGNOSIS — I10 Essential (primary) hypertension: Secondary | ICD-10-CM | POA: Diagnosis not present

## 2019-01-02 DIAGNOSIS — Z7982 Long term (current) use of aspirin: Secondary | ICD-10-CM | POA: Diagnosis not present

## 2019-01-02 DIAGNOSIS — E7800 Pure hypercholesterolemia, unspecified: Secondary | ICD-10-CM | POA: Diagnosis not present

## 2019-01-02 DIAGNOSIS — R51 Headache: Secondary | ICD-10-CM | POA: Diagnosis not present

## 2019-01-02 DIAGNOSIS — Z79899 Other long term (current) drug therapy: Secondary | ICD-10-CM | POA: Insufficient documentation

## 2019-01-02 DIAGNOSIS — R519 Headache, unspecified: Secondary | ICD-10-CM

## 2019-01-02 DIAGNOSIS — R110 Nausea: Secondary | ICD-10-CM | POA: Diagnosis not present

## 2019-01-02 MED ORDER — ONDANSETRON 4 MG PO TBDP
4.0000 mg | ORAL_TABLET | Freq: Once | ORAL | Status: AC
  Administered 2019-01-02: 4 mg via ORAL
  Filled 2019-01-02: qty 1

## 2019-01-02 MED ORDER — OXYCODONE-ACETAMINOPHEN 5-325 MG PO TABS
1.0000 | ORAL_TABLET | Freq: Once | ORAL | Status: AC
  Administered 2019-01-02: 1 via ORAL
  Filled 2019-01-02: qty 1

## 2019-01-02 NOTE — ED Notes (Addendum)
PA Albrizze notified of pt's confusion and is at bed side.

## 2019-01-02 NOTE — ED Triage Notes (Signed)
Pt in c/o headache for the last several days, states she has a history of same, worse last night, no distress noted, pt reports her BP was elevated in the office last week and she is worried this is related

## 2019-01-02 NOTE — ED Notes (Signed)
Discharge instructions discussed with Pt. Pt verbalized understanding. Pt stable and ambulatory.    

## 2019-01-02 NOTE — ED Notes (Signed)
Patient transported to CT 

## 2019-01-02 NOTE — Discharge Instructions (Addendum)
Today you were evaluated at the emergency department for a headache.  1. Medications: continue usual home medications 2. Treatment: rest, drink plenty of fluids, if headache persists you can take tylenol as directed on the bottle 3. Follow Up: Please followup with your primary doctor in 3 days for discussion of your diagnoses and further evaluation after today's visit; Please return to the ER for double vision, speech difficulty, gait disturbance, persistent vomiting or other concerns.  Headache:  You are having a headache. No specific cause was found today for your headache. It may have been a migraine or other cause of headache. Stress, anxiety, fatigue, and depression are common triggers for headaches. Your headache today does not appear to be life-threatening or require hospitalization, but often the exact cause of headaches is not determined in the emergency department. Therefore, followup with your doctor is very important to find out what may have caused your headache, and whether or not you need any further diagnostic testing or treatment. Sometimes headaches can appear benign but then more serious symptoms can develop which should prompt an immediate reevaluation by your doctor or the emergency department.  Hydration: Have a goal of about a half liter of water every couple hours to stay well hydrated.   Sleep: Please be sure to get plenty of sleep with a goal of 8 hours per night. Having a regular bed time and bedtime routine can help with this.  Screens: Reduce the amount of time you are in front of screens.  Take about a 5-10-minute break every hour or every couple hours to give your eyes rest.  Do not use screens in dark rooms.  Glasses with a blue light filter may also help reduce eye fatigue.  Stress: Take steps to reduce stress as much as possible.   Seek immediate medical attention if:  You develop possible problems with medications prescribed. The medications don't resolve your  headache, if it recurs, or if you have multiple episodes of vomiting or can't take fluids by mouth You have a change from the usual headache. If you developed a sudden severe headache or confusion, become poorly responsive or faint, developed a fever above 100.4 or problems breathing, have a change in speech, vision, swallowing or understanding, or developed new weakness, numbness, tingling, incoordination or have a seizure.

## 2019-01-02 NOTE — ED Provider Notes (Signed)
[facility] EMERGENCY DEPARTMENT Provider Note   CSN: [id number] Arrival date & time: 01/02/19  7829    History   Chief Complaint Chief Complaint  Patient presents with  . Headache    HPI [name] is a 73 y.o. female presenting to emergency department today with chief complaint of headache x3 days.  Patient states her headache has progressively gotten worse since onset.  The headache was 10 out of 10 in severity last night.  She did not take anything for pain prior to arrival.  Patient states she has history of headaches and this feels similar.  She describes the headache as feeling like there is something pulsing inside her head.  Also reports associated blurry vision and photophobia.  She states this is normal for her when she gets a headache.  Patient has had nausea without vomiting.  Denies fever, abdominal pain, chest pain, shortness of breath, back pain, neck pain, urinary symptoms, congestion, sinus pain.       Past Medical History:  Diagnosis Date  . Arthritis   . Chronic lower back pain   . Cluster headache   . Coronary artery disease    a. 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 non obs dz LHC 2007. c. Non obs dz (mild-mod) by [facility] 05/17/14 d. cath 05/26/17 mild non obstructive CAD  . Diverticulitis    a. Hx microperf 2012 - hospitalizated.  Marland Kitchen GERD (gastroesophageal reflux disease)   . Hypercholesteremia   . Hypertension   . PVC's (premature ventricular contractions)    a. Hx of trigeminy/bigeminy.  . Seizures (HCC)   . Thyroid disease    hyperthyroid  . UTI (lower urinary tract infection)     Patient Active Problem List   Diagnosis Date Noted  . Anemia 12/22/2017  . Demand ischemia (HCC)   . Hyperthyroidism 05/22/2017  . Dark stools 05/22/2017  . Protein-calorie malnutrition, moderate (HCC) 05/22/2017  . Overactive bladder 09/19/2014  . Coronary artery disease involving native coronary artery of native heart with angina pectoris (HCC)   . Seizures (HCC)    . Arthritis   . Hypercholesteremia   . GERD (gastroesophageal reflux disease) 10/04/2011  . HTN (hypertension) 10/01/2011    Past Surgical History:  Procedure Laterality Date  . ABDOMINAL HYSTERECTOMY  1987   "partial"-still has ovaries  . CARDIAC CATHETERIZATION  05/17/2014  . CORONARY ANGIOPLASTY WITH STENT PLACEMENT  1995   "1"  . JOINT REPLACEMENT     right knee  . LEFT HEART CATH AND CORONARY ANGIOGRAPHY N/A 05/26/2017   Procedure: Left Heart Cath and Coronary Angiography;  Surgeon: Corky Crafts, MD;  Location: [facility] INVASIVE CV LAB;  Service: Cardiovascular;  Laterality: N/A;  . LEFT HEART CATHETERIZATION WITH CORONARY ANGIOGRAM N/A 05/17/2014   Procedure: LEFT HEART CATHETERIZATION WITH CORONARY ANGIOGRAM;  Surgeon: Marykay Lex, MD;  Location: [facility] CATH LAB;  Service: Cardiovascular;  Laterality: N/A;  . TOTAL KNEE ARTHROPLASTY Right 2005  . TUBAL LIGATION  1970  . VASCULAR SURGERY       OB History   No obstetric history on file.      Home Medications    Prior to Admission medications   Medication Sig Start Date End Date Taking? Authorizing Provider  amLODipine (NORVASC) 10 MG tablet TAKE 1 TABLET BY MOUTH EVERY DAY 11/29/18   Shelva Majestic, MD  aspirin 325 MG EC tablet Take 325 mg by mouth every other day.    [provider]  atorvastatin (LIPITOR) 20 MG  tablet TAKE 1 TABLET BY MOUTH EVERY DAY 12/29/18   Shelva Majestic, MD  B Complex Vitamins (VITAMIN B COMPLEX) TABS Take 1 tablet by mouth daily.    [provider]  cholecalciferol (VITAMIN D) 400 UNITS TABS tablet Take 400 Units daily by mouth.     [provider]  esomeprazole (NEXIUM) 40 MG capsule Take 1 capsule (40 mg total) by mouth daily. 12/26/18   Shelva Majestic, MD  ezetimibe (ZETIA) 10 MG tablet Take 1 tablet (10 mg total) by mouth daily. 12/26/18   Shelva Majestic, MD  Ferrous Sulfate Dried (EQ SLOW-RELEASE IRON) 45 MG TBCR Take 1 tablet by mouth daily. 01/21/18    Jarold Motto, PA  isosorbide mononitrate (IMDUR) 60 MG 24 hr tablet TAKE 2 TABLETS BY MOUTH EVERY DAY 12/29/18   Shelva Majestic, MD  lisinopril (PRINIVIL,ZESTRIL) 20 MG tablet Take 1 tablet (20 mg total) by mouth daily. 12/26/18   Shelva Majestic, MD  loperamide (IMODIUM) 2 MG capsule Take 1 capsule (2 mg total) by mouth as needed for diarrhea or loose stools. 10/04/17   Regalado, Jon Billings A, MD  methimazole (TAPAZOLE) 10 MG tablet 2 pills each morning, and 1 pill each evening 07/20/18   Romero Belling, MD  nitroGLYCERIN (NITROSTAT) 0.4 MG SL tablet PLACE 1 TABLET UNDER THE TONGUE EVERY 5 MINUTES FOR CHEST PAIN FOR 3 DOSES 08/10/18   Shelva Majestic, MD  oxybutynin (DITROPAN-XL) 10 MG 24 hr tablet TAKE 1 TABLET BY MOUTH ONCE DAILY 12/29/18   Shelva Majestic, MD  potassium chloride (MICRO-K) 10 MEQ CR capsule TAKE 1 CAPSULE BY MOUTH TWICE DAILY 07/18/18   Shelva Majestic, MD  propranolol (INDERAL) 60 MG tablet Take 1 tablet (60 mg total) by mouth 2 (two) times daily. 12/26/18   Shelva Majestic, MD  traMADol (ULTRAM) 50 MG tablet Take 1-2 tablets (50-100 mg total) by mouth every 6 (six) hours as needed for moderate pain. 12/22/17   Jarold Motto, PA  venlafaxine XR (EFFEXOR-XR) 150 MG 24 hr capsule TAKE ONE CAPSULE BY MOUTH EVERY NIGHT AT BEDTIME 11/29/18   Shelva Majestic, MD    Family History Family History  Problem Relation Age of Onset  . CAD Brother   . Diabetes Brother   . CAD Father   . Diabetes Father   . Diabetes Mother        father, sister, brothers  . Diabetes Sister   . Thyroid disease Sister     Social History Social History   Tobacco Use  . Smoking status: Never Smoker  . Smokeless tobacco: Never Used  Substance Use Topics  . Alcohol use: No  . Drug use: No     Allergies   Sulfa drugs cross reactors   Review of Systems Review of Systems  Constitutional: Negative for chills and fever.  HENT: Negative for congestion, ear pain, sinus pressure, sinus pain  and sore throat.   Eyes: Positive for photophobia and visual disturbance.  Respiratory: Negative for cough and shortness of breath.   Cardiovascular: Negative for chest pain.  Gastrointestinal: Positive for nausea. Negative for abdominal pain, diarrhea and vomiting.  Genitourinary: Negative for dysuria.  Musculoskeletal: Negative for arthralgias, back pain, neck pain and neck stiffness.  Skin: Negative for wound.  Neurological: Positive for headaches. Negative for dizziness, syncope and weakness.     Physical Exam Updated Vital Signs BP (!) 166/87   Pulse 77   Temp 98.4 F (36.9 C) (Oral)  Resp 16   SpO2 100%   Physical Exam Vitals signs and nursing note reviewed.  Constitutional:      Appearance: She is well-developed. She is not toxic-appearing.  HENT:     Head: Normocephalic and atraumatic.     Comments: No sinus or temporal tenderness.    Right Ear: Tympanic membrane and external ear normal.     Left Ear: Tympanic membrane and external ear normal.     Nose: Nose normal. No congestion.     Mouth/Throat:     Mouth: Mucous membranes are moist.     Pharynx: Oropharynx is clear. No posterior oropharyngeal erythema.  Eyes:     General: No scleral icterus.       Right eye: No discharge.        Left eye: No discharge.     Extraocular Movements: Extraocular movements intact.     Conjunctiva/sclera: Conjunctivae normal.     Pupils: Pupils are equal, round, and reactive to light.  Neck:     Musculoskeletal: Normal range of motion. No neck rigidity or muscular tenderness.  Cardiovascular:     Rate and Rhythm: Normal rate and regular rhythm.     Pulses: Normal pulses.     Heart sounds: Normal heart sounds.  Pulmonary:     Effort: Pulmonary effort is normal.     Breath sounds: Normal breath sounds.  Abdominal:     General: There is no distension.     Palpations: Abdomen is soft.     Tenderness: There is no abdominal tenderness. There is no guarding or rebound.   Musculoskeletal: Normal range of motion.  Skin:    General: Skin is warm and dry.  Neurological:     Mental Status: She is oriented to person, place, and time.     Comments: Speech is clear and goal oriented, follows commands CN III-XII intact, no facial droop Normal strength in upper and lower extremities bilaterally including dorsiflexion and plantar flexion, strong and equal grip strength Sensation normal to light and sharp touch Moves extremities without ataxia, coordination intact Normal finger to nose and rapid alternating movements Normal gait and balance.   Psychiatric:        Behavior: Behavior normal.      ED Treatments / Results  Labs (all labs ordered are listed, but only abnormal results are displayed) Labs Reviewed - No data to display  EKG None  Radiology Ct Head Wo Contrast  Result Date: 01/02/2019 CLINICAL DATA:  Headache for the last several days, worsening. Hypertension. EXAM: CT HEAD WITHOUT CONTRAST TECHNIQUE: Contiguous axial images were obtained from the base of the skull through the vertex without intravenous contrast. COMPARISON:  07/10/2018 and multiple previous FINDINGS: Brain: No evidence of accelerated atrophy, old or acute infarction, mass lesion, hemorrhage, hydrocephalus or extra-axial collection. Vascular: There is atherosclerotic calcification of the major vessels at the base of the brain. Skull: Right frontoparietal convexity outer table osteoma, chronic and not significant. Sinuses/Orbits: No inflammatory sinus disease.  Orbits are negative. Other: None IMPRESSION: No abnormality seen to explain headache. Atherosclerotic calcification of the major vessels at the base of the brain. 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 Electronically Signed   By: Paulina Fusi M.D.   On: 01/02/2019 16:24    Procedures Procedures (including critical care time)  Medications Ordered in ED Medications  ondansetron (ZOFRAN-ODT)  disintegrating tablet 4 mg (4 mg Oral Given 01/02/19 1449)  oxyCODONE-acetaminophen (PERCOCET/ROXICET) 5-325 MG per tablet 1 tablet (1 tablet Oral Given  01/02/19 1450)     Initial Impression / Assessment and Plan / ED Course  I have reviewed the triage vital signs and the nursing notes.  Pertinent labs & imaging results that were available during my care of the patient were reviewed by me and considered in my medical decision making (see chart for details).    Pt Is afebrile, in no acute distress, well-appearing.  She has history of headaches and states this 1 feels similar.  Patient has document history of cluster headaches. Pt put on 4 L nasal cannula and she states headache improved.  Patient also given Zofran for nausea and Percocet PO.  Patient is tolerating p.o. intake. Pt HA treated and improved while in ED.  Presentation is like pts typical HA and non concerning for [facility], ICH, Meningitis, or temporal arteritis. Pt with no focal neuro deficits on my initial exam.  Later when reevaluating the patient she seemed confused and did not remember talking to me earlier.  She difficulty recalling the year, but was eventually able to after 2 minutes.  Given patient's new confusion CT head obtained.  CT head viewed by me shows no abnormalities.  Pt is to follow up with PCP to discuss prophylactic medication.  Patient has PCP appointment already scheduled for next week.  Recommend she keep that appointment is scheduled for follow-up.  Patient is hemodynamically stable, in NAD, and able to ambulate in the ED. Evaluation does not show pathology that would require ongoing emergent intervention or inpatient treatment. I explained the diagnosis to the patient. Pain has been managed and has no complaints prior to discharge. Patient is comfortable with above plan and is stable for discharge at this time. All questions were answered prior to disposition. Strict return precautions for returning to the ED were discussed.  Encouraged follow up with PCP. Pt case discussed with Dr. Juleen China who agrees with my plan.        Final Clinical Impressions(s) / ED Diagnoses   Final diagnoses:  Acute nonintractable headache, unspecified headache type    ED Discharge Orders    None       Sherene Sires, PA-C 01/02/19 1701    Raeford Razor, MD 01/04/19 1535

## 2019-01-05 DIAGNOSIS — I259 Chronic ischemic heart disease, unspecified: Secondary | ICD-10-CM | POA: Diagnosis not present

## 2019-01-05 DIAGNOSIS — E785 Hyperlipidemia, unspecified: Secondary | ICD-10-CM | POA: Diagnosis not present

## 2019-01-05 DIAGNOSIS — R5383 Other fatigue: Secondary | ICD-10-CM | POA: Diagnosis not present

## 2019-01-05 DIAGNOSIS — E0590 Thyrotoxicosis, unspecified without thyrotoxic crisis or storm: Secondary | ICD-10-CM | POA: Diagnosis not present

## 2019-01-10 DIAGNOSIS — I10 Essential (primary) hypertension: Secondary | ICD-10-CM | POA: Diagnosis not present

## 2019-02-08 DIAGNOSIS — I10 Essential (primary) hypertension: Secondary | ICD-10-CM | POA: Diagnosis not present

## 2019-02-23 DIAGNOSIS — Z79891 Long term (current) use of opiate analgesic: Secondary | ICD-10-CM | POA: Diagnosis not present

## 2019-03-15 ENCOUNTER — Other Ambulatory Visit: Payer: Self-pay | Admitting: Endocrinology

## 2019-03-23 ENCOUNTER — Other Ambulatory Visit: Payer: Self-pay | Admitting: Endocrinology

## 2019-04-07 DIAGNOSIS — I10 Essential (primary) hypertension: Secondary | ICD-10-CM | POA: Diagnosis not present

## 2019-04-09 ENCOUNTER — Other Ambulatory Visit: Payer: Self-pay | Admitting: Endocrinology

## 2019-04-10 NOTE — Telephone Encounter (Signed)
Please refill x 1 F/u is due  

## 2019-04-18 ENCOUNTER — Other Ambulatory Visit: Payer: Self-pay

## 2019-04-18 NOTE — Patient Outreach (Signed)
[facility]) Care Management  04/18/2019  [name] 1946/04/30 [id number]   Medication Adherence call to [name] spoke with patient she is past due on Atorvastatin 20 mg patient explain she take 1 tablet daily at the beginning of the year they fill the prescription two times patient end up with a six month supply patient explain she has plenty and did not need any at this time.[name] is showing past due under [name].   [facility] Direct Dial [phone]  Fax [phone] [email or web address]

## 2019-05-05 DIAGNOSIS — K219 Gastro-esophageal reflux disease without esophagitis: Secondary | ICD-10-CM | POA: Diagnosis not present

## 2019-06-02 DIAGNOSIS — I10 Essential (primary) hypertension: Secondary | ICD-10-CM | POA: Diagnosis not present

## 2019-06-06 ENCOUNTER — Other Ambulatory Visit: Payer: Self-pay | Admitting: Family Medicine

## 2019-06-10 ENCOUNTER — Other Ambulatory Visit: Payer: Self-pay | Admitting: Family Medicine

## 2019-06-10 ENCOUNTER — Other Ambulatory Visit: Payer: Self-pay | Admitting: Endocrinology

## 2019-06-11 NOTE — Telephone Encounter (Signed)
Please refill x 1 F/u is due  

## 2019-06-12 NOTE — Telephone Encounter (Signed)
Spoke to pt to make an appt. Pt stated she will call back to schedule something. Rx filled for 30 days.

## 2019-06-25 ENCOUNTER — Other Ambulatory Visit: Payer: Self-pay | Admitting: Family Medicine

## 2019-06-29 DIAGNOSIS — I10 Essential (primary) hypertension: Secondary | ICD-10-CM | POA: Diagnosis not present

## 2019-07-10 ENCOUNTER — Other Ambulatory Visit: Payer: Self-pay | Admitting: Family Medicine

## 2019-07-11 NOTE — Telephone Encounter (Signed)
Last OV 12/26/18 f/u in 2-3 weeks.   Pt overdue for f/u visit, please call to schedule.   Thanks!

## 2019-07-11 NOTE — Telephone Encounter (Signed)
lvm for patient to call back and schedule a f/u appt. °

## 2019-07-28 DIAGNOSIS — I10 Essential (primary) hypertension: Secondary | ICD-10-CM | POA: Diagnosis not present

## 2019-08-05 ENCOUNTER — Emergency Department (HOSPITAL_COMMUNITY): Payer: Medicare Other

## 2019-08-05 ENCOUNTER — Inpatient Hospital Stay (HOSPITAL_COMMUNITY)
Admission: EM | Admit: 2019-08-05 | Discharge: 2019-08-14 | DRG: 177 | Disposition: A | Discharge status: Home / Self Care | Payer: Medicare Other | Attending: Family Medicine | Admitting: Family Medicine

## 2019-08-05 ENCOUNTER — Encounter (HOSPITAL_COMMUNITY): Payer: Self-pay | Admitting: Emergency Medicine

## 2019-08-05 ENCOUNTER — Other Ambulatory Visit: Payer: Self-pay

## 2019-08-05 DIAGNOSIS — R0789 Other chest pain: Secondary | ICD-10-CM | POA: Diagnosis not present

## 2019-08-05 DIAGNOSIS — R269 Unspecified abnormalities of gait and mobility: Secondary | ICD-10-CM | POA: Diagnosis not present

## 2019-08-05 DIAGNOSIS — I110 Hypertensive heart disease with heart failure: Secondary | ICD-10-CM | POA: Diagnosis present

## 2019-08-05 DIAGNOSIS — E876 Hypokalemia: Secondary | ICD-10-CM | POA: Diagnosis present

## 2019-08-05 DIAGNOSIS — K5909 Other constipation: Secondary | ICD-10-CM | POA: Diagnosis present

## 2019-08-05 DIAGNOSIS — N390 Urinary tract infection, site not specified: Secondary | ICD-10-CM | POA: Diagnosis present

## 2019-08-05 DIAGNOSIS — I371 Nonrheumatic pulmonary valve insufficiency: Secondary | ICD-10-CM | POA: Diagnosis present

## 2019-08-05 DIAGNOSIS — Z90710 Acquired absence of both cervix and uterus: Secondary | ICD-10-CM

## 2019-08-05 DIAGNOSIS — G8929 Other chronic pain: Secondary | ICD-10-CM | POA: Diagnosis present

## 2019-08-05 DIAGNOSIS — R911 Solitary pulmonary nodule: Secondary | ICD-10-CM | POA: Diagnosis present

## 2019-08-05 DIAGNOSIS — I248 Other forms of acute ischemic heart disease: Secondary | ICD-10-CM | POA: Diagnosis not present

## 2019-08-05 DIAGNOSIS — M79602 Pain in left arm: Secondary | ICD-10-CM

## 2019-08-05 DIAGNOSIS — E0500 Thyrotoxicosis with diffuse goiter without thyrotoxic crisis or storm: Secondary | ICD-10-CM | POA: Diagnosis not present

## 2019-08-05 DIAGNOSIS — R079 Chest pain, unspecified: Secondary | ICD-10-CM | POA: Diagnosis not present

## 2019-08-05 DIAGNOSIS — Z955 Presence of coronary angioplasty implant and graft: Secondary | ICD-10-CM

## 2019-08-05 DIAGNOSIS — I509 Heart failure, unspecified: Secondary | ICD-10-CM | POA: Diagnosis not present

## 2019-08-05 DIAGNOSIS — D72819 Decreased white blood cell count, unspecified: Secondary | ICD-10-CM | POA: Diagnosis present

## 2019-08-05 DIAGNOSIS — U071 COVID-19: Secondary | ICD-10-CM | POA: Diagnosis not present

## 2019-08-05 DIAGNOSIS — N179 Acute kidney failure, unspecified: Secondary | ICD-10-CM

## 2019-08-05 DIAGNOSIS — K219 Gastro-esophageal reflux disease without esophagitis: Secondary | ICD-10-CM | POA: Diagnosis present

## 2019-08-05 DIAGNOSIS — I25119 Atherosclerotic heart disease of native coronary artery with unspecified angina pectoris: Secondary | ICD-10-CM | POA: Diagnosis not present

## 2019-08-05 DIAGNOSIS — Z882 Allergy status to sulfonamides status: Secondary | ICD-10-CM

## 2019-08-05 DIAGNOSIS — Z8249 Family history of ischemic heart disease and other diseases of the circulatory system: Secondary | ICD-10-CM

## 2019-08-05 DIAGNOSIS — K5900 Constipation, unspecified: Secondary | ICD-10-CM

## 2019-08-05 DIAGNOSIS — I249 Acute ischemic heart disease, unspecified: Secondary | ICD-10-CM

## 2019-08-05 DIAGNOSIS — R5381 Other malaise: Secondary | ICD-10-CM | POA: Diagnosis not present

## 2019-08-05 DIAGNOSIS — E0590 Thyrotoxicosis, unspecified without thyrotoxic crisis or storm: Secondary | ICD-10-CM | POA: Diagnosis present

## 2019-08-05 DIAGNOSIS — I083 Combined rheumatic disorders of mitral, aortic and tricuspid valves: Secondary | ICD-10-CM | POA: Diagnosis not present

## 2019-08-05 DIAGNOSIS — I5043 Acute on chronic combined systolic (congestive) and diastolic (congestive) heart failure: Secondary | ICD-10-CM | POA: Diagnosis present

## 2019-08-05 DIAGNOSIS — M1990 Unspecified osteoarthritis, unspecified site: Secondary | ICD-10-CM | POA: Diagnosis present

## 2019-08-05 DIAGNOSIS — Z8679 Personal history of other diseases of the circulatory system: Secondary | ICD-10-CM | POA: Diagnosis not present

## 2019-08-05 DIAGNOSIS — R531 Weakness: Secondary | ICD-10-CM

## 2019-08-05 DIAGNOSIS — I252 Old myocardial infarction: Secondary | ICD-10-CM

## 2019-08-05 DIAGNOSIS — E785 Hyperlipidemia, unspecified: Secondary | ICD-10-CM | POA: Diagnosis present

## 2019-08-05 DIAGNOSIS — I493 Ventricular premature depolarization: Secondary | ICD-10-CM | POA: Diagnosis not present

## 2019-08-05 DIAGNOSIS — D6959 Other secondary thrombocytopenia: Secondary | ICD-10-CM | POA: Diagnosis not present

## 2019-08-05 DIAGNOSIS — I2489 Other forms of acute ischemic heart disease: Secondary | ICD-10-CM | POA: Diagnosis present

## 2019-08-05 DIAGNOSIS — R0602 Shortness of breath: Secondary | ICD-10-CM

## 2019-08-05 DIAGNOSIS — I10 Essential (primary) hypertension: Secondary | ICD-10-CM | POA: Diagnosis not present

## 2019-08-05 DIAGNOSIS — Z96651 Presence of right artificial knee joint: Secondary | ICD-10-CM | POA: Diagnosis present

## 2019-08-05 DIAGNOSIS — I340 Nonrheumatic mitral (valve) insufficiency: Secondary | ICD-10-CM

## 2019-08-05 DIAGNOSIS — I361 Nonrheumatic tricuspid (valve) insufficiency: Secondary | ICD-10-CM | POA: Diagnosis not present

## 2019-08-05 DIAGNOSIS — M545 Low back pain: Secondary | ICD-10-CM | POA: Diagnosis present

## 2019-08-05 DIAGNOSIS — E042 Nontoxic multinodular goiter: Secondary | ICD-10-CM | POA: Diagnosis not present

## 2019-08-05 DIAGNOSIS — Z79899 Other long term (current) drug therapy: Secondary | ICD-10-CM

## 2019-08-05 DIAGNOSIS — I472 Ventricular tachycardia: Secondary | ICD-10-CM | POA: Diagnosis present

## 2019-08-05 DIAGNOSIS — Z7982 Long term (current) use of aspirin: Secondary | ICD-10-CM

## 2019-08-05 DIAGNOSIS — B9620 Unspecified Escherichia coli [E. coli] as the cause of diseases classified elsewhere: Secondary | ICD-10-CM | POA: Diagnosis not present

## 2019-08-05 DIAGNOSIS — Z8349 Family history of other endocrine, nutritional and metabolic diseases: Secondary | ICD-10-CM

## 2019-08-05 LAB — URINALYSIS, ROUTINE W REFLEX MICROSCOPIC
Bacteria, UA: NONE SEEN
Bilirubin Urine: NEGATIVE
Glucose, UA: NEGATIVE mg/dL
Ketones, ur: NEGATIVE mg/dL
Leukocytes,Ua: NEGATIVE
Nitrite: NEGATIVE
Protein, ur: 100 mg/dL — AB
Specific Gravity, Urine: 1.009 (ref 1.005–1.030)
pH: 5 (ref 5.0–8.0)

## 2019-08-05 LAB — RAPID URINE DRUG SCREEN, HOSP PERFORMED
Amphetamines: NOT DETECTED
Barbiturates: NOT DETECTED
Benzodiazepines: NOT DETECTED
Cocaine: NOT DETECTED
Opiates: NOT DETECTED
Tetrahydrocannabinol: NOT DETECTED

## 2019-08-05 LAB — BASIC METABOLIC PANEL
Anion gap: 13 (ref 5–15)
BUN: 12 mg/dL (ref 8–23)
CO2: 21 mmol/L — ABNORMAL LOW (ref 22–32)
Calcium: 9.3 mg/dL (ref 8.9–10.3)
Chloride: 101 mmol/L (ref 98–111)
Creatinine, Ser: 1.22 mg/dL — ABNORMAL HIGH (ref 0.44–1.00)
GFR calc Af Amer: 51 mL/min — ABNORMAL LOW (ref >60)
GFR calc non Af Amer: 44 mL/min — ABNORMAL LOW (ref >60)
Glucose, Bld: 117 mg/dL — ABNORMAL HIGH (ref 70–99)
Potassium: 3.7 mmol/L (ref 3.5–5.1)
Sodium: 135 mmol/L (ref 135–145)

## 2019-08-05 LAB — HEPATIC FUNCTION PANEL
ALT: 24 U/L (ref 0–44)
AST: 25 U/L (ref 15–41)
Albumin: 3.9 g/dL (ref 3.5–5.0)
Alkaline Phosphatase: 77 U/L (ref 38–126)
Bilirubin, Direct: 0.2 mg/dL (ref 0.0–0.2)
Indirect Bilirubin: 1.2 mg/dL — ABNORMAL HIGH (ref 0.3–0.9)
Total Bilirubin: 1.4 mg/dL — ABNORMAL HIGH (ref 0.3–1.2)
Total Protein: 8.2 g/dL — ABNORMAL HIGH (ref 6.5–8.1)

## 2019-08-05 LAB — TSH: TSH: 3.101 u[IU]/mL (ref 0.350–4.500)

## 2019-08-05 LAB — CBC
HCT: 45.6 % (ref 36.0–46.0)
Hemoglobin: 14.9 g/dL (ref 12.0–15.0)
MCH: 31.4 pg (ref 26.0–34.0)
MCHC: 32.7 g/dL (ref 30.0–36.0)
MCV: 96 fL (ref 80.0–100.0)
Platelets: 184 10*3/uL (ref 150–400)
RBC: 4.75 MIL/uL (ref 3.87–5.11)
RDW: 14 % (ref 11.5–15.5)
WBC: 3.8 10*3/uL — ABNORMAL LOW (ref 4.0–10.5)
nRBC: 0 % (ref 0.0–0.2)

## 2019-08-05 LAB — SARS CORONAVIRUS 2 (TAT 6-24 HRS): SARS Coronavirus 2: POSITIVE — AB

## 2019-08-05 LAB — BRAIN NATRIURETIC PEPTIDE: B Natriuretic Peptide: 2030.7 pg/mL — ABNORMAL HIGH (ref 0.0–100.0)

## 2019-08-05 LAB — PROTIME-INR
INR: 1.2 (ref 0.8–1.2)
Prothrombin Time: 14.7 seconds (ref 11.4–15.2)

## 2019-08-05 LAB — TROPONIN I (HIGH SENSITIVITY)
Troponin I (High Sensitivity): 54 ng/L — ABNORMAL HIGH (ref <18)
Troponin I (High Sensitivity): 54 ng/L — ABNORMAL HIGH (ref <18)

## 2019-08-05 LAB — PHOSPHORUS: Phosphorus: 3.9 mg/dL (ref 2.5–4.6)

## 2019-08-05 LAB — LIPASE, BLOOD: Lipase: 25 U/L (ref 11–51)

## 2019-08-05 LAB — CBG MONITORING, ED: Glucose-Capillary: 100 mg/dL — ABNORMAL HIGH (ref 70–99)

## 2019-08-05 LAB — LACTIC ACID, PLASMA: Lactic Acid, Venous: 1.8 mmol/L (ref 0.5–1.9)

## 2019-08-05 LAB — MAGNESIUM: Magnesium: 1.9 mg/dL (ref 1.7–2.4)

## 2019-08-05 MED ORDER — ISOSORBIDE MONONITRATE ER 60 MG PO TB24
120.0000 mg | ORAL_TABLET | Freq: Every day | ORAL | Status: DC
  Administered 2019-08-06: 10:00:00 120 mg via ORAL
  Filled 2019-08-05: qty 4

## 2019-08-05 MED ORDER — OXYBUTYNIN CHLORIDE ER 5 MG PO TB24
10.0000 mg | ORAL_TABLET | Freq: Every day | ORAL | Status: DC
  Administered 2019-08-06 – 2019-08-14 (×9): 10 mg via ORAL
  Filled 2019-08-05 (×5): qty 2
  Filled 2019-08-05: qty 1
  Filled 2019-08-05 (×3): qty 2

## 2019-08-05 MED ORDER — SODIUM CHLORIDE 0.9 % IV SOLN: 250.0000 mL | INTRAVENOUS | Status: DC | PRN

## 2019-08-05 MED ORDER — ENOXAPARIN SODIUM 40 MG/0.4ML ~~LOC~~ SOLN
40.0000 mg | Freq: Every day | SUBCUTANEOUS | Status: DC
  Administered 2019-08-06 – 2019-08-07 (×2): 40 mg via SUBCUTANEOUS
  Filled 2019-08-05 (×3): qty 0.4

## 2019-08-05 MED ORDER — PANTOPRAZOLE SODIUM 40 MG PO TBEC
40.0000 mg | DELAYED_RELEASE_TABLET | Freq: Every day | ORAL | Status: DC
  Administered 2019-08-06 – 2019-08-14 (×9): 40 mg via ORAL
  Filled 2019-08-05 (×9): qty 1

## 2019-08-05 MED ORDER — ASPIRIN 81 MG PO CHEW
324.0000 mg | CHEWABLE_TABLET | Freq: Once | ORAL | Status: AC
  Administered 2019-08-05: 324 mg via ORAL
  Filled 2019-08-05: qty 4

## 2019-08-05 MED ORDER — ASPIRIN EC 325 MG PO TBEC
325.0000 mg | DELAYED_RELEASE_TABLET | ORAL | Status: DC
  Administered 2019-08-06 – 2019-08-14 (×6): 325 mg via ORAL
  Filled 2019-08-05 (×7): qty 1

## 2019-08-05 MED ORDER — SODIUM CHLORIDE 0.9% FLUSH
3.0000 mL | Freq: Two times a day (BID) | INTRAVENOUS | Status: DC
  Administered 2019-08-06 – 2019-08-14 (×17): 3 mL via INTRAVENOUS

## 2019-08-05 MED ORDER — ACETAMINOPHEN 325 MG PO TABS
650.0000 mg | ORAL_TABLET | Freq: Four times a day (QID) | ORAL | Status: DC | PRN
  Administered 2019-08-06 – 2019-08-09 (×8): 650 mg via ORAL
  Filled 2019-08-05 (×9): qty 2

## 2019-08-05 MED ORDER — EZETIMIBE 10 MG PO TABS
10.0000 mg | ORAL_TABLET | Freq: Every day | ORAL | Status: DC
  Administered 2019-08-06 – 2019-08-14 (×9): 10 mg via ORAL
  Filled 2019-08-05 (×10): qty 1

## 2019-08-05 MED ORDER — NITROGLYCERIN 2 % TD OINT
1.0000 [in_us] | TOPICAL_OINTMENT | Freq: Four times a day (QID) | TRANSDERMAL | Status: DC
  Administered 2019-08-05 – 2019-08-06 (×4): 1 [in_us] via TOPICAL
  Filled 2019-08-05 (×4): qty 1

## 2019-08-05 MED ORDER — LOPERAMIDE HCL 2 MG PO CAPS
2.0000 mg | ORAL_CAPSULE | ORAL | Status: DC | PRN
  Administered 2019-08-08: 2 mg via ORAL
  Filled 2019-08-05: qty 1

## 2019-08-05 MED ORDER — SODIUM CHLORIDE 0.9% FLUSH
3.0000 mL | Freq: Once | INTRAVENOUS | Status: AC
  Administered 2019-08-05: 3 mL via INTRAVENOUS

## 2019-08-05 MED ORDER — METHIMAZOLE 10 MG PO TABS
40.0000 mg | ORAL_TABLET | Freq: Two times a day (BID) | ORAL | Status: DC
  Administered 2019-08-06 – 2019-08-14 (×18): 40 mg via ORAL
  Filled 2019-08-05 (×20): qty 4

## 2019-08-05 MED ORDER — FERROUS SULFATE 325 (65 FE) MG PO TABS
325.0000 mg | ORAL_TABLET | Freq: Every day | ORAL | Status: DC
  Administered 2019-08-06 – 2019-08-14 (×9): 325 mg via ORAL
  Filled 2019-08-05 (×9): qty 1

## 2019-08-05 MED ORDER — HYDROCODONE-ACETAMINOPHEN 5-325 MG PO TABS
1.0000 | ORAL_TABLET | ORAL | Status: DC | PRN
  Administered 2019-08-06 – 2019-08-09 (×2): 1 via ORAL
  Administered 2019-08-11: 2 via ORAL
  Administered 2019-08-13: 1 via ORAL
  Filled 2019-08-05: qty 1
  Filled 2019-08-05: qty 2
  Filled 2019-08-05: qty 1
  Filled 2019-08-05: qty 2
  Filled 2019-08-05 (×2): qty 1

## 2019-08-05 MED ORDER — AMLODIPINE BESYLATE 10 MG PO TABS
10.0000 mg | ORAL_TABLET | Freq: Every day | ORAL | Status: DC
  Administered 2019-08-06: 10 mg via ORAL
  Filled 2019-08-05: qty 2

## 2019-08-05 MED ORDER — PROPRANOLOL HCL 40 MG PO TABS
60.0000 mg | ORAL_TABLET | Freq: Two times a day (BID) | ORAL | Status: DC
  Administered 2019-08-05 – 2019-08-07 (×4): 60 mg via ORAL
  Filled 2019-08-05 (×4): qty 2

## 2019-08-05 MED ORDER — SODIUM CHLORIDE 0.9% FLUSH: 3.0000 mL | INTRAVENOUS | Status: DC | PRN

## 2019-08-05 MED ORDER — METOPROLOL TARTRATE 5 MG/5ML IV SOLN
5.0000 mg | Freq: Once | INTRAVENOUS | Status: AC
  Administered 2019-08-05: 5 mg via INTRAVENOUS
  Filled 2019-08-05: qty 5

## 2019-08-05 MED ORDER — FUROSEMIDE 10 MG/ML IJ SOLN
40.0000 mg | Freq: Once | INTRAMUSCULAR | Status: AC
  Administered 2019-08-05: 40 mg via INTRAVENOUS
  Filled 2019-08-05: qty 4

## 2019-08-05 MED ORDER — VENLAFAXINE HCL ER 75 MG PO CP24
150.0000 mg | ORAL_CAPSULE | Freq: Every day | ORAL | Status: DC
  Administered 2019-08-06 – 2019-08-14 (×9): 150 mg via ORAL
  Filled 2019-08-05 (×2): qty 2
  Filled 2019-08-05: qty 1
  Filled 2019-08-05 (×7): qty 2

## 2019-08-05 MED ORDER — ACETAMINOPHEN 650 MG RE SUPP
650.0000 mg | Freq: Four times a day (QID) | RECTAL | Status: DC | PRN
  Filled 2019-08-05: qty 1

## 2019-08-05 MED ORDER — ONDANSETRON HCL 4 MG PO TABS
4.0000 mg | ORAL_TABLET | Freq: Four times a day (QID) | ORAL | Status: DC | PRN
  Administered 2019-08-08 – 2019-08-14 (×2): 4 mg via ORAL
  Filled 2019-08-05 (×2): qty 1

## 2019-08-05 MED ORDER — ONDANSETRON HCL 4 MG/2ML IJ SOLN
4.0000 mg | Freq: Four times a day (QID) | INTRAMUSCULAR | Status: DC | PRN
  Administered 2019-08-06 – 2019-08-11 (×3): 4 mg via INTRAVENOUS
  Filled 2019-08-05 (×3): qty 2

## 2019-08-05 MED ORDER — NITROGLYCERIN 0.4 MG SL SUBL
0.4000 mg | SUBLINGUAL_TABLET | SUBLINGUAL | Status: DC | PRN
  Administered 2019-08-06: 0.4 mg via SUBLINGUAL
  Filled 2019-08-05: qty 1

## 2019-08-05 MED ORDER — ATORVASTATIN CALCIUM 10 MG PO TABS
20.0000 mg | ORAL_TABLET | Freq: Every day | ORAL | Status: DC
  Administered 2019-08-06 – 2019-08-14 (×9): 20 mg via ORAL
  Filled 2019-08-05 (×9): qty 2

## 2019-08-05 NOTE — ED Triage Notes (Signed)
TC to Pt's daughter Stanton Kidney  Reported update on patient  Care.

## 2019-08-05 NOTE — ED Provider Notes (Signed)
[facility] EMERGENCY DEPARTMENT Provider Note   CSN: [id number] Arrival date & time: 08/05/19  7829     History   Chief Complaint Chief Complaint  Patient presents with  . Back Pain  . Chest Pain  . Headache    HPI [name] is a 73 y.o. female.     HPI Patient reports that she has not been feeling well for couple of weeks.  She reports she has been getting symptoms of chest discomfort with shortness of breath and headache.  She has been getting tightness in her upper chest she indicates along the top on the left and then she reports that her left arm will just start aching and feel uncomfortable.  That has been coming and going but often it is worse in the very early hours of the morning.  She reports she started to get short of breath and fatigued with normal activities.  Patient reports she has been taking her medications regularly.  She reports that she is getting up to urinate many times during the night even though she is not taking any "water pills".  She reports sometimes she is having just a generalized aching headache.  She denies any fevers, no chills no vomiting or diarrhea.  She reports her appetite is somewhat diminished but she has been able to eat and drink without difficulty.  She reports she just does not feel good and she knows there is something wrong.  She reports it feels something similar to when her thyroid gave her problems and she had to be admitted to the hospital. Patient's daughter reports that the patient was exposed to COVID by a family member approximately 20 days ago.  She reports the patient was tested approximately 7 days ago and tested negative. Past Medical History:  Diagnosis Date  . Arthritis   . Chronic lower back pain   . Cluster headache   . Coronary artery disease    a. 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 non obs dz LHC 2007. c. Non obs dz (mild-mod) by [facility] 05/17/14 d. cath 05/26/17 mild non obstructive CAD  . Diverticulitis    a.  Hx microperf 2012 - hospitalizated.  Marland Kitchen GERD (gastroesophageal reflux disease)   . Hypercholesteremia   . Hypertension   . PVC's (premature ventricular contractions)    a. Hx of trigeminy/bigeminy.  . Seizures (HCC)   . Thyroid disease    hyperthyroid  . UTI (lower urinary tract infection)     Patient Active Problem List   Diagnosis Date Noted  . Anemia 12/22/2017  . Demand ischemia (HCC)   . Hyperthyroidism 05/22/2017  . Dark stools 05/22/2017  . Protein-calorie malnutrition, moderate (HCC) 05/22/2017  . Overactive bladder 09/19/2014  . Coronary artery disease involving native coronary artery of native heart with angina pectoris (HCC)   . Seizures (HCC)   . Arthritis   . Hypercholesteremia   . GERD (gastroesophageal reflux disease) 10/04/2011  . HTN (hypertension) 10/01/2011    Past Surgical History:  Procedure Laterality Date  . ABDOMINAL HYSTERECTOMY  1987   "partial"-still has ovaries  . CARDIAC CATHETERIZATION  05/17/2014  . CORONARY ANGIOPLASTY WITH STENT PLACEMENT  1995   "1"  . JOINT REPLACEMENT     right knee  . LEFT HEART CATH AND CORONARY ANGIOGRAPHY N/A 05/26/2017   Procedure: Left Heart Cath and Coronary Angiography;  Surgeon: Corky Crafts, MD;  Location: [facility] INVASIVE CV LAB;  Service: Cardiovascular;  Laterality: N/A;  . LEFT HEART CATHETERIZATION  WITH CORONARY ANGIOGRAM N/A 05/17/2014   Procedure: LEFT HEART CATHETERIZATION WITH CORONARY ANGIOGRAM;  Surgeon: Leonie Man, MD;  Location: [facility] CATH LAB;  Service: Cardiovascular;  Laterality: N/A;  . TOTAL KNEE ARTHROPLASTY Right 2005  . TUBAL LIGATION  1970  . VASCULAR SURGERY       OB History   No obstetric history on file.      Home Medications    Prior to Admission medications   Medication Sig Start Date End Date Taking? Authorizing Provider  amLODipine (NORVASC) 10 MG tablet TAKE 1 TABLET BY MOUTH EVERY DAY Patient taking differently: Take 10 mg by mouth daily.  11/29/18  Yes Marin Olp, MD  aspirin 325 MG EC tablet Take 325 mg by mouth every other day.   Yes [provider]  atorvastatin (LIPITOR) 20 MG tablet TAKE 1 TABLET BY MOUTH EVERY DAY Patient taking differently: Take 20 mg by mouth daily at 6 PM. TAKE 1 TABLET BY MOUTH EVERY DAY 06/06/19  Yes Marin Olp, MD  B Complex Vitamins (VITAMIN B COMPLEX) TABS Take 1 tablet by mouth daily.   Yes [provider]  cholecalciferol (VITAMIN D) 400 UNITS TABS tablet Take 400 Units daily by mouth.    Yes [provider]  esomeprazole (NEXIUM) 40 MG capsule Take 1 capsule (40 mg total) by mouth daily. 12/26/18  Yes Marin Olp, MD  ezetimibe (ZETIA) 10 MG tablet Take 1 tablet (10 mg total) by mouth daily. 12/26/18  Yes Marin Olp, MD  Ferrous Sulfate Dried (EQ SLOW-RELEASE IRON) 45 MG TBCR Take 1 tablet by mouth daily. 01/21/18  Yes Inda Coke, PA  isosorbide mononitrate (IMDUR) 60 MG 24 hr tablet TAKE 2 TABLETS BY MOUTH EVERY DAY 12/29/18  Yes Marin Olp, MD  lisinopril (PRINIVIL,ZESTRIL) 20 MG tablet Take 1 tablet (20 mg total) by mouth daily. 12/26/18  Yes Marin Olp, MD  loperamide (IMODIUM) 2 MG capsule Take 1 capsule (2 mg total) by mouth as needed for diarrhea or loose stools. 10/04/17  Yes Regalado, Belkys A, MD  methimazole (TAPAZOLE) 10 MG tablet TAKE 4 TABLETS BY MOUTH TWICE DAILY Patient taking differently: Take 40 mg by mouth 2 (two) times daily. TAKE 4 TABLETS BY MOUTH TWICE DAILY 06/12/19  Yes Renato Shin, MD  oxybutynin (DITROPAN-XL) 10 MG 24 hr tablet TAKE 1 TABLET BY MOUTH ONCE DAILY Patient taking differently: Take 10 mg by mouth at bedtime.  12/29/18  Yes Marin Olp, MD  potassium chloride (MICRO-K) 10 MEQ CR capsule TAKE 1 CAPSULE BY MOUTH TWICE DAILY 06/26/19  Yes Marin Olp, MD  propranolol (INDERAL) 60 MG tablet Take 1 tablet (60 mg total) by mouth 2 (two) times daily. 12/26/18  Yes Marin Olp, MD  traMADol (ULTRAM) 50 MG tablet Take 1-2  tablets (50-100 mg total) by mouth every 6 (six) hours as needed for moderate pain. 12/22/17  Yes Inda Coke, PA  venlafaxine XR (EFFEXOR-XR) 150 MG 24 hr capsule TAKE 1 CAPSULE BY MOUTH EVERY NIGHT AT BEDTIME - office visit before next refill Patient taking differently: Take 150 mg by mouth daily with breakfast. TAKE 1 CAPSULE BY MOUTH EVERY NIGHT AT BEDTIME - office visit before next refill 07/11/19  Yes Marin Olp, MD  nitroGLYCERIN (NITROSTAT) 0.4 MG SL tablet PLACE 1 TABLET UNDER THE TONGUE EVERY 5 MINUTES FOR CHEST PAIN FOR 3 DOSES 08/10/18   Marin Olp, MD    Family History Family History  Problem Relation Age of Onset  . CAD Brother   . Diabetes Brother   . CAD Father   . Diabetes Father   . Diabetes Mother        father, sister, brothers  . Diabetes Sister   . Thyroid disease Sister     Social History Social History   Tobacco Use  . Smoking status: Never Smoker  . Smokeless tobacco: Never Used  Substance Use Topics  . Alcohol use: No  . Drug use: No     Allergies   Sulfa drugs cross reactors   Review of Systems Review of Systems 10 Systems reviewed and are negative for acute change except as noted in the HPI.  Physical Exam Updated Vital Signs BP (!) 159/113   Pulse 86   Temp 98.3 F (36.8 C) (Oral)   Resp (!) 23   SpO2 95%   Physical Exam Constitutional:      Comments: Patient is thin but well-nourished and well-developed.  She does not have respiratory distress at rest.  Mental status is clear.  HENT:     Head: Normocephalic and atraumatic.     Mouth/Throat:     Mouth: Mucous membranes are moist.  Eyes:     Extraocular Movements: Extraocular movements intact.  Cardiovascular:     Rate and Rhythm: Normal rate and regular rhythm.     Comments: Occasional ectopic beats.  2\6 systolic ejection murmur. Pulmonary:     Comments: No respiratory distress at rest.  Basilar crackles mild.  Good airflow bilaterally without significant  wheeze. Abdominal:     General: There is no distension.     Palpations: Abdomen is soft.     Tenderness: There is no abdominal tenderness. There is no guarding.  Musculoskeletal: Normal range of motion.        General: No tenderness.     Comments: No peripheral edema.  Calf soft and nontender.  Skin:    General: Skin is warm.  Neurological:     General: No focal deficit present.     Mental Status: She is oriented to person, place, and time.     Coordination: Coordination normal.  Psychiatric:        Mood and Affect: Mood normal.      ED Treatments / Results  Labs (all labs ordered are listed, but only abnormal results are displayed) Labs Reviewed  BASIC METABOLIC PANEL - Abnormal; Notable for the following components:      Result Value   CO2 21 (*)    Glucose, Bld 117 (*)    Creatinine, Ser 1.22 (*)    GFR calc non Af Amer 44 (*)    GFR calc Af Amer 51 (*)    All other components within normal limits  CBC - Abnormal; Notable for the following components:   WBC 3.8 (*)    All other components within normal limits  BRAIN NATRIURETIC PEPTIDE - Abnormal; Notable for the following components:   B Natriuretic Peptide 2,030.7 (*)    All other components within normal limits  HEPATIC FUNCTION PANEL - Abnormal; Notable for the following components:   Total Protein 8.2 (*)    Total Bilirubin 1.4 (*)    Indirect Bilirubin 1.2 (*)    All other components within normal limits  TROPONIN I (HIGH SENSITIVITY) - Abnormal; Notable for the following components:   Troponin I (High Sensitivity) 54 (*)    All other components within normal limits  LIPASE, BLOOD  LACTIC ACID, PLASMA  PROTIME-INR  TSH  MAGNESIUM  PHOSPHORUS  URINALYSIS, ROUTINE W REFLEX MICROSCOPIC  RAPID URINE DRUG SCREEN, HOSP PERFORMED  T3, FREE  TROPONIN I (HIGH SENSITIVITY)    EKG EKG Interpretation  Date/Time:  Saturday August 05 2019 10:14:24 EDT Ventricular Rate:  94 PR Interval:  100 QRS  Duration: 110 QT Interval:  400 QTC Calculation: 500 R Axis:   81 Text Interpretation:  Sinus rhythm with short PR Moderate voltage criteria for LVH, may be normal variant ( Sokolow-Lyon , Cornell product ) Marked ST abnormality, possible inferolateral subendocardial injury Prolonged QT Abnormal ECG agreesig increased inferior ST depression c/w previous Confirmed by Arby BarrettePfeiffer, Shaili Donalson [phone]([id number]) on 08/05/2019 10:53:09 AM   Radiology Dg Chest 2 View  Result Date: 08/05/2019 CLINICAL DATA:  Chest pain, back pain EXAM: CHEST - 2 VIEW COMPARISON:  09/29/2017 FINDINGS: Heart size is upper limits of normal, stable. Calcific aortic knob. Rounded 11 mm nodular density projects within the left upper lobe. Right lung appears clear. No pleural effusion. No pneumothorax. IMPRESSION: Rounded 1.1 cm nodular density projecting within the left upper lobe. Recommend CT of the chest for further evaluation. Electronically Signed   By: Duanne GuessNicholas  Plundo M.D.   On: 08/05/2019 11:07    Procedures Procedures (including critical care time) .CRITICAL CARE Performed by: Arby BarretteMarcy Darwyn Ponzo   Total critical care time: 30 minutes  Critical care time was exclusive of separately billable procedures and treating other patients.  Critical care was necessary to treat or prevent imminent or life-threatening deterioration.  Critical care was time spent personally by me on the following activities: development of treatment plan with patient and/or surrogate as well as nursing, discussions with consultants, evaluation of patient's response to treatment, examination of patient, obtaining history from patient or surrogate, ordering and performing treatments and interventions, ordering and review of laboratory studies, ordering and review of radiographic studies, pulse oximetry and re-evaluation of patient's condition. Medications Ordered in ED Medications  aspirin chewable tablet 324 mg (has no administration in time range)   nitroGLYCERIN (NITROGLYN) 2 % ointment 1 inch (has no administration in time range)  furosemide (LASIX) injection 40 mg (has no administration in time range)  sodium chloride flush (NS) 0.9 % injection 3 mL (3 mLs Intravenous Given 08/05/19 1120)     Initial Impression / Assessment and Plan / ED Course  I have reviewed the triage vital signs and the nursing notes.  Pertinent labs & imaging results that were available during my care of the patient were reviewed by me and considered in my medical decision making (see chart for details).        Consult: Reviewed with cardiology.  Advises patient should be admitted for rule out and CHF to medical service.  They will do consultation.  At this time suspect mild troponin elevation to be secondary to demand ischemia.  Patient presents as outlined above.  She has been having chest tightness and feeling of aching in her left arm.  Troponin is mildly elevated.  EKG has old inferior ischemic changes without acute MI.  Patient does not currently have any active chest pain or arm pain.  Will start on aspirin, Lasix and blood pressure control.  Patient had possible covert exposure approximately 20 days ago.  Symptoms sound atypical.  She is afebrile and does not have any patchy pneumonia.  She had a negative test approximately 7 days ago will have repeat testing done in the emergency department.  Plan for admission with higher suspicion for ACS/demand ischemia as  source of patient's elevated BNP and mild troponin elevation. Final Clinical Impressions(s) / ED Diagnoses   Final diagnoses:  ACS (acute coronary syndrome) (HCC)  Acute congestive heart failure, unspecified heart failure type [facility])    ED Discharge Orders    None       Arby Barrette, MD 08/05/19 1305

## 2019-08-05 NOTE — H&P (Addendum)
[facility]                                                                                    Patient Demographics  [name], is a 73 y.o. female  CSN: [id number]  MRN: [id number]  DOB - Dec 30, 1945  Admit Date - 08/05/2019  Outpatient Primary MD for the patient is Marin Olp, MD   With History of -  Past Medical History:  Diagnosis Date  . Arthritis   . Chronic lower back pain   . Cluster headache   . Coronary artery disease    a. 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 non obs dz LHC 2007. c. Non obs dz (mild-mod) by [facility] 05/17/14 d. cath 05/26/17 mild non obstructive CAD  . Diverticulitis    a. Hx microperf 2012 - hospitalizated.  Marland Kitchen GERD (gastroesophageal reflux disease)   . Hypercholesteremia   . Hypertension   . PVC's (premature ventricular contractions)    a. Hx of trigeminy/bigeminy.  . Seizures ([place])   . Thyroid disease    hyperthyroid  . UTI (lower urinary tract infection)       Past Surgical History:  Procedure Laterality Date  . ABDOMINAL HYSTERECTOMY  1987   "partial"-still has ovaries  . CARDIAC CATHETERIZATION  05/17/2014  . CORONARY ANGIOPLASTY WITH STENT PLACEMENT  1995   "1"  . JOINT REPLACEMENT     right knee  . LEFT HEART CATH AND CORONARY ANGIOGRAPHY N/A 05/26/2017   Procedure: Left Heart Cath and Coronary Angiography;  Surgeon: Jettie Booze, MD;  Location: [facility];  Service: Cardiovascular;  Laterality: N/A;  . LEFT HEART CATHETERIZATION WITH CORONARY ANGIOGRAM N/A 05/17/2014   Procedure: LEFT HEART CATHETERIZATION WITH CORONARY ANGIOGRAM;  Surgeon: Leonie Man, MD;  Location: [facility] CATH LAB;  Service: Cardiovascular;  Laterality: N/A;  . TOTAL KNEE ARTHROPLASTY Right 2005  . TUBAL LIGATION  1970  . VASCULAR SURGERY      in for   Chief Complaint  Patient presents with  . Back Pain  . Chest Pain  . Headache     HPI  [name]  is a 73 y.o. female, with past medical history significant for CAD, status post  cardiac catheterization with nonobstructive lesions in 8/18, history of hypertension hyperlipidemia presenting today with few days history of chest discomfort, tightness in the upper chest radiating to the left arm with worsening shortness of breath . The patient has a history of exposure to COVID-19 around 2 weeks ago but tested negative for COVID-19 1 week ago. She feels very weak. In the emergency room work-up showed  elevated BNP at 2000 and mild increase in troponins.  However his EKG shows ST depressions in the inferolateral leads which were significant. No history of fever or chills    Review of Systems    In addition to the HPI above,  No Fever-chills, Mild Headache, No changes with Vision or hearing, No problems swallowing food or Liquids, No Abdominal pain, No Nausea or Vommitting, Bowel movements are regular, No Blood in stool or Urine, No dysuria, No new skin rashes or bruises, No new joints pains-aches,  No new weakness,  tingling, numbness in any extremity, No recent weight gain or loss, No polyuria, polydypsia or polyphagia, No significant Mental Stressors.  .   Social History Social History   Tobacco Use  . Smoking status: Never Smoker  . Smokeless tobacco: Never Used  Substance Use Topics  . Alcohol use: No     Family History Family History  Problem Relation Age of Onset  . CAD Brother   . Diabetes Brother   . CAD Father   . Diabetes Father   . Diabetes Mother        father, sister, brothers  . Diabetes Sister   . Thyroid disease Sister      Prior to Admission medications   Medication Sig Start Date End Date Taking? Authorizing Provider  amLODipine (NORVASC) 10 MG tablet TAKE 1 TABLET BY MOUTH EVERY DAY Patient taking differently: Take 10 mg by mouth daily.  11/29/18  Yes [name], Stephen O, MD  aspirin 325 MG EC tablet Take 325 mg by mouth every other day.   Yes [provider]  atorvastatin (LIPITOR) 20 MG tablet TAKE 1 TABLET BY MOUTH  EVERY DAY Patient taking differently: Take 20 mg by mouth daily at 6 PM. TAKE 1 TABLET BY MOUTH EVERY DAY 06/06/19  Yes [name], Stephen O, MD  B Complex Vitamins (VITAMIN B COMPLEX) TABS Take 1 tablet by mouth daily.   Yes [provider]  cholecalciferol (VITAMIN D) 400 UNITS TABS tablet Take 400 Units daily by mouth.    Yes [provider]  esomeprazole (NEXIUM) 40 MG capsule Take 1 capsule (40 mg total) by mouth daily. 12/26/18  Yes [name], Stephen O, MD  ezetimibe (ZETIA) 10 MG tablet Take 1 tablet (10 mg total) by mouth daily. 12/26/18  Yes [name], Stephen O, MD  Ferrous Sulfate Dried (EQ SLOW-RELEASE IRON) 45 MG TBCR Take 1 tablet by mouth daily. 01/21/18  Yes Jarold MottoWorley, Samantha, PA  isosorbide mononitrate (IMDUR) 60 MG 24 hr tablet TAKE 2 TABLETS BY MOUTH EVERY DAY 12/29/18  Yes [name], Stephen O, MD  lisinopril (PRINIVIL,ZESTRIL) 20 MG tablet Take 1 tablet (20 mg total) by mouth daily. 12/26/18  Yes [name], Stephen O, MD  loperamide (IMODIUM) 2 MG capsule Take 1 capsule (2 mg total) by mouth as needed for diarrhea or loose stools. 10/04/17  Yes Regalado, Belkys A, MD  methimazole (TAPAZOLE) 10 MG tablet TAKE 4 TABLETS BY MOUTH TWICE DAILY Patient taking differently: Take 40 mg by mouth 2 (two) times daily. TAKE 4 TABLETS BY MOUTH TWICE DAILY 06/12/19  Yes Romero BellingEllison, Sean, MD  oxybutynin (DITROPAN-XL) 10 MG 24 hr tablet TAKE 1 TABLET BY MOUTH ONCE DAILY Patient taking differently: Take 10 mg by mouth at bedtime.  12/29/18  Yes [name], Stephen O, MD  potassium chloride (MICRO-K) 10 MEQ CR capsule TAKE 1 CAPSULE BY MOUTH TWICE DAILY 06/26/19  Yes [name], Stephen O, MD  propranolol (INDERAL) 60 MG tablet Take 1 tablet (60 mg total) by mouth 2 (two) times daily. 12/26/18  Yes [name], Stephen O, MD  traMADol (ULTRAM) 50 MG tablet Take 1-2 tablets (50-100 mg total) by mouth every 6 (six) hours as needed for moderate pain. 12/22/17  Yes Jarold MottoWorley, Samantha, PA  venlafaxine XR (EFFEXOR-XR) 150 MG 24 hr  capsule TAKE 1 CAPSULE BY MOUTH EVERY NIGHT AT BEDTIME - office visit before next refill Patient taking differently: Take 150 mg by mouth daily with breakfast. TAKE 1 CAPSULE BY MOUTH EVERY NIGHT AT BEDTIME - office visit before next refill 07/11/19  Yes Shelva Majestic, MD  nitroGLYCERIN (NITROSTAT) 0.4 MG SL tablet PLACE 1 TABLET UNDER THE TONGUE EVERY 5 MINUTES FOR CHEST PAIN FOR 3 DOSES 08/10/18   Shelva Majestic, MD    Allergies  Allergen Reactions  . Sulfa Drugs Cross Reactors Shortness Of Breath and Palpitations    Physical Exam  Vitals  Blood pressure (!) 161/101, pulse 80, temperature 98.3 F (36.8 C), temperature source Oral, resp. rate (!) 28, height 5\' 6"  (1.676 m), weight 61.2 kg, SpO2 96 %. General appearance, no acute distress , slightly anxious HEENT no jaundice or pallor, no facial deviation oral thrush Neck supple, no neck vein distention noted Chest decreased breath sounds at the bases otherwise rhonchi or wheezing Heart normal S1-S2, no murmurs gallops rubs Abdomen soft, nontender, bowel sounds present Extremities no clubbing cyanosis or edema Neuro grossly nonfocal, patient moving all extremities Skin no rashes or ulcers    Data Review  CBC Recent Labs  Lab 08/05/19 1019  WBC 3.8*  HGB 14.9  HCT 45.6  PLT 184  MCV 96.0  MCH 31.4  MCHC 32.7  RDW 14.0   ------------------------------------------------------------------------------------------------------------------  Chemistries  Recent Labs  Lab 08/05/19 1019 08/05/19 1114  NA 135  --   K 3.7  --   CL 101  --   CO2 21*  --   GLUCOSE 117*  --   BUN 12  --   CREATININE 1.22*  --   CALCIUM 9.3  --   MG  --  1.9  AST  --  25  ALT  --  24  ALKPHOS  --  77  BILITOT  --  1.4*   ------------------------------------------------------------------------------------------------------------------ estimated creatinine clearance is 39 mL/min (A) (by C-G formula based on SCr of 1.22 mg/dL  (H)). ------------------------------------------------------------------------------------------------------------------ Recent Labs    08/05/19 1114  TSH 3.101     Coagulation profile Recent Labs  Lab 08/05/19 1114  INR 1.2   ------------------------------------------------------------------------------------------------------------------- No results for input(s): DDIMER in the last 72 hours. -------------------------------------------------------------------------------------------------------------------  Cardiac Enzymes No results for input(s): CKMB, TROPONINI, MYOGLOBIN in the last 168 hours.  Invalid input(s): CK ------------------------------------------------------------------------------------------------------------------ Invalid input(s): POCBNP   ---------------------------------------------------------------------------------------------------------------  Urinalysis    Component Value Date/Time   COLORURINE STRAW (A) 12/01/2018 0851   APPEARANCEUR CLEAR 12/01/2018 0851   LABSPEC 1.011 12/01/2018 0851   PHURINE 7.0 12/01/2018 0851   GLUCOSEU NEGATIVE 12/01/2018 0851   HGBUR NEGATIVE 12/01/2018 0851   BILIRUBINUR NEGATIVE 12/01/2018 0851   BILIRUBINUR Negative 01/20/2018 1214   KETONESUR NEGATIVE 12/01/2018 0851   PROTEINUR NEGATIVE 12/01/2018 0851   UROBILINOGEN 1.0 01/20/2018 1214   UROBILINOGEN 1.0 05/17/2014 0342   NITRITE NEGATIVE 12/01/2018 0851   LEUKOCYTESUR NEGATIVE 12/01/2018 0851    ----------------------------------------------------------------------------------------------------------------  ABG   Imaging results:   Dg Chest 2 View  Result Date: 08/05/2019 CLINICAL DATA:  Chest pain, back pain EXAM: CHEST - 2 VIEW COMPARISON:  09/29/2017 FINDINGS: Heart size is upper limits of normal, stable. Calcific aortic knob. Rounded 11 mm nodular density projects within the left upper lobe. Right lung appears clear. No pleural effusion. No  pneumothorax. IMPRESSION: Rounded 1.1 cm nodular density projecting within the left upper lobe. Recommend CT of the chest for further evaluation. Electronically Signed   By: 14/02/2017 M.D.   On: 08/05/2019 11:07    My personal review of EKG: Rhythm NSR, at 94 bpm with short PR and ST depressions in inferior and lateral leads worse compared to previous EKGs  Assessment & Plan  Acute coronary syndrome Patient has a history of elevated troponins on the basis of vasospasm status post cardiac catheterization in 2018 which did not show any significant disease. Marked ST depressions in the inferolateral leads Cardiology on consult  Check troponin Cardiogram ordered  Congestive heart failure with no recent echocardiogram Last echocardiogram was done on 09/2017 with ejection fraction 50-55% and grade 2 diastolic dysfunction. We will start Lasix IV  Hypertension We will hold lisinopril due to diuresis at this time Continue with amlodipine?  Vasospasm  Leukopenia Chronic , will monitor  Hypercholesterolemia Continue with Lipitor  History of exposure to COVID-19 around 2 weeks ago with negative COVID-19 1 week ago. Repeat test today  Left upper lung nodule noted by chest x-ray Will need CT of chest after the acute event is over   DVT Prophylaxis Lovenox  AM Labs Ordered, also please review Full Orders  Family Communication:   Code Status full  Disposition Plan: Home  Time spent in minutes : 43 minutes  Condition GUARDED   @

## 2019-08-05 NOTE — Consult Note (Addendum)
Cardiology Consultation:   Patient ID: [name] MRN: [id number]; DOB: [birth year]  Admit date: 08/05/2019 Date of Consult: 08/05/2019  Primary Care Provider: Marin Olp, MD Primary Cardiologist:[name]    Patient Profile:   [name] is a 73 y.o. female with a hx of with hx of CAD with coronary spasm, HTN, HLD, Grave's Dz and multinodular goiter (followed by Dr. Hale Reynolds), mitral regurgitation and chronic diastolic heart failure who is being seen today for the evaluation of CP and elevated BNP at the request of [name].   Admitted 05/22/17-05/27/17 subacute thyroiditis and NSTEMI with peak of troponin 0.27. No PE on CTA of chest. Echo showed LVEF 55%, possible hypokinesis in 1 segment. Baseline EKG abnormal. Abnormal stress test with small ischemia in the apical anterior and moderate ischemia in the basal and mid inferior walls. Cath showed mild nonobstructive CAD, elevated troponin sec to demand ischemia.   Last echocardiogram December 2018 showed LV function of 50 to 45%, grade 2 diastolic dysfunction mild to moderate mitral regurgitation.  She was doing well on cardiac standpoint when last seen by [name] 04/2018.   History of Present Illness:   [name] presented with chest pain. No feeling well since few weeks. She had COVID exposure about 2 weeks ago and tested negative for COVID 1 week ago. Reports not taking diuretics. Had headache. But no chills, vomiting or diarrhea. No appetite. Symptoms similar when she has thyroiditis.   Blood pressure minimally elevated 150-160s/80-100s. Hs-troponin 54 x2. BNP 2030. Scr 1.22. TSH 3.01.  CXR IMPRESSION: Rounded 1.1 cm nodular density projecting within the left upper lobe. Recommend CT of the chest for further evaluation.   Heart Pathway Score:     Past Medical History:  Diagnosis Date  . Arthritis   . Chronic lower back pain   . Cluster headache   . Coronary artery disease    a. 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 non obs dz  LHC 2007. c. Non obs dz (mild-mod) by [facility] 05/17/14 d. cath 05/26/17 mild non obstructive CAD  . Diverticulitis    a. Hx microperf 2012 - hospitalizated.  Marland Kitchen GERD (gastroesophageal reflux disease)   . Hypercholesteremia   . Hypertension   . PVC's (premature ventricular contractions)    a. Hx of trigeminy/bigeminy.  . Seizures ([place])   . Thyroid disease    hyperthyroid  . UTI (lower urinary tract infection)     Past Surgical History:  Procedure Laterality Date  . ABDOMINAL HYSTERECTOMY  1987   "partial"-still has ovaries  . CARDIAC CATHETERIZATION  05/17/2014  . CORONARY ANGIOPLASTY WITH STENT PLACEMENT  1995   "1"  . JOINT REPLACEMENT     right knee  . LEFT HEART CATH AND CORONARY ANGIOGRAPHY N/A 05/26/2017   Procedure: Left Heart Cath and Coronary Angiography;  Surgeon: Katherine Booze, MD;  Location: [facility];  Service: Cardiovascular;  Laterality: N/A;  . LEFT HEART CATHETERIZATION WITH CORONARY ANGIOGRAM N/A 05/17/2014   Procedure: LEFT HEART CATHETERIZATION WITH CORONARY ANGIOGRAM;  Surgeon: Katherine Man, MD;  Location: [facility] CATH LAB;  Service: Cardiovascular;  Laterality: N/A;  . TOTAL KNEE ARTHROPLASTY Right 2005  . TUBAL LIGATION  1970  . VASCULAR SURGERY       Inpatient Medications: Scheduled Meds: . nitroGLYCERIN  1 inch Topical Q6H   Continuous Infusions:  PRN Meds:   Allergies:    Allergies  Allergen Reactions  . Sulfa Drugs Cross Reactors Shortness Of Breath and Palpitations  Social History:   Social History   Socioeconomic History  . Marital status: Married    Spouse name: Not on file  . Number of children: 4  . Years of education: 4  . Highest education level: Not on file  Occupational History    Comment: retired  Engineer, productionocial Needs  . Financial resource strain: Not on file  . Food insecurity    Worry: Not on file    Inability: Not on file  . Transportation needs    Medical: Not on file    Non-medical: Not on file  Tobacco Use  .  Smoking status: Never Smoker  . Smokeless tobacco: Never Used  Substance and Sexual Activity  . Alcohol use: No  . Drug use: No  . Sexual activity: Never  Lifestyle  . Physical activity    Days per week: Not on file    Minutes per session: Not on file  . Stress: Not on file  Relationships  . Social Musicianconnections    Talks on phone: Not on file    Gets together: Not on file    Attends religious service: Not on file    Active member of club or organization: Not on file    Attends meetings of clubs or organizations: Not on file    Relationship status: Not on file  . Intimate partner violence    Fear of current or ex partner: Not on file    Emotionally abused: Not on file    Physically abused: Not on file    Forced sexual activity: Not on file  Other Topics Concern  . Not on file  Social History Narrative   Separated. 4 children from first marriage. 16 grandkids.       Retired from [employer] for [employer] years, went to [employer] for 5 years. Retired 2003 after MI      Hobbies: babysit/active with children      Right-handed      Caffeine: 24 oz soda per day       Family History:    Family History  Problem Relation Age of Onset  . CAD Brother   . Diabetes Brother   . CAD Father   . Diabetes Father   . Diabetes Mother        father, sister, brothers  . Diabetes Sister   . Thyroid disease Sister      ROS:  Please see the history of present illness.  All other ROS reviewed and negative.     Physical Exam/Data:   Vitals:   08/05/19 1215 08/05/19 1230 08/05/19 1300 08/05/19 1306  BP: (!) 162/112 (!) 159/113 (!) 161/101   Pulse: 87 86 80   Resp: (!) 23 (!) 23 (!) 28   Temp:      TempSrc:      SpO2: 97% 95% 96%   Weight:    61.2 kg  Height:    5\' 6"  (1.676 m)   No intake or output data in the 24 hours ending 08/05/19 1414 Last 3 Weights 08/05/2019 12/26/2018 07/20/2018  Weight (lbs) 135 lb 137 lb 124 lb 12.8 oz  Weight (kg) 61.236 kg 62.143 kg 56.609 kg     Body mass  index is 21.79 kg/m.    EKG:  The EKG was personally reviewed and demonstrates: Sinus rhythm at rate of 98 bpm, short PR interval, inferior lateral T wave inversion which may be pronounced than prior EKG   Relevant CV Studies:   Echo 09/2017 Left ventricle: The  cavity size was normal. Systolic function was   normal. The estimated ejection fraction was in the range of 50%   to 55%. Hypokinesis of the basal-midinferior myocardium;   consistent with infarction in the distribution of the right   coronary artery. Features are consistent with a pseudonormal left   ventricular filling pattern, with concomitant abnormal relaxation   and increased filling pressure (grade 2 diastolic dysfunction). - Mitral valve: Malcoaptation due to mildly restricted motion of   the posterior leaflet, consistent with ischemic tethering. There   was mild to moderate regurgitation directed eccentrically and   posteriorly. - Left atrium: The atrium was moderately dilated.  Laboratory Data:  High Sensitivity Troponin:   Recent Labs  Lab 08/05/19 1019 08/05/19 1223  TROPONINIHS 54* 54*     Chemistry Recent Labs  Lab 08/05/19 1019  NA 135  K 3.7  CL 101  CO2 21*  GLUCOSE 117*  BUN 12  CREATININE 1.22*  CALCIUM 9.3  GFRNONAA 44*  GFRAA 51*  ANIONGAP 13    Recent Labs  Lab 08/05/19 1114  PROT 8.2*  ALBUMIN 3.9  AST 25  ALT 24  ALKPHOS 77  BILITOT 1.4*   Hematology Recent Labs  Lab 08/05/19 1019  WBC 3.8*  RBC 4.75  HGB 14.9  HCT 45.6  MCV 96.0  MCH 31.4  MCHC 32.7  RDW 14.0  PLT 184   BNP Recent Labs  Lab 08/05/19 1114  BNP 2,030.7*     Radiology/Studies:  Dg Chest 2 View  Result Date: 08/05/2019 CLINICAL DATA:  Chest pain, back pain EXAM: CHEST - 2 VIEW COMPARISON:  09/29/2017 FINDINGS: Heart size is upper limits of normal, stable. Calcific aortic knob. Rounded 11 mm nodular density projects within the left upper lobe. Right lung appears clear. No pleural effusion.  No pneumothorax. IMPRESSION: Rounded 1.1 cm nodular density projecting within the left upper lobe. Recommend CT of the chest for further evaluation. Electronically Signed   By: Duanne Guess M.D.   On: 08/05/2019 11:07    Assessment and Plan:   1. Chest pain with hx of coronary vasospasm - HS-troponin 54 x2. EKG with slight more pronounced ST depression inferior later leads. Placed on nitro paste.   2. Mitral regurgitation - mild to moderate by last echo 09/2017. Pending repeat echo.   3. Chronic diastolic CHF  - BNP 2030. Pending echo -Given  IV lasix 40mg  x1 inER.   4. HTN - Elevated   5. COVID exposure about 2 weeks - tested negative 7 days ago - Pending test here  Dr. to perform physical exam and review Plan. Pending COVID.   For questions or updates, please contact [facility] Please consult [email or web address] for contact info under     SignedPurvis Sheffield, PA  08/05/2019 2:14 PM   The patient was seen and examined, and I agree with the history, physical exam, assessment and plan as documented above, with modifications as noted below. I have also personally reviewed all relevant documentation, old records, labs, and both radiographic and cardiovascular studies. I have also independently interpreted old and new ECG's.  Briefly, this is a 73 year old woman with a history of coronary disease and vasospasm, hypertension, Graves' disease, multinodular goiter, and chronic diastolic heart failure.  She was hospitalized in July 2018 for a non-STEMI.  Due to an abnormal nuclear stress test she underwent cardiac catheterization which showed mild nonobstructive coronary disease.  Echocardiogram in December 2018 showed normal LV systolic function, LVEF 50 to  55%, with grade 2 diastolic dysfunction.  She presents to the [facility], ER today with complaints of generalized fatigue and weakness for the past 3 weeks.  She had COVID exposure roughly 2 weeks ago and  tested negative for COVID about 1 week ago.  She denies fevers and chills.  She has been experiencing left arm pain for the past 1-1/2 weeks intermittently.  She said "I have a funny feeling in my chest "but says it is nothing like the chest pain she had back in 2018.  She also describes exertional dyspnea.  High-sensitivity troponins were nonspecific with 2 levels of 54.  TSH is normal.  BNP markedly elevated at 2030.  Cardiovascular exam was unremarkable.  ECG with 2 mm ST depressions in inferolateral leads otherwise 1 mm ST depressions.  When compared to the ECG performed on 07/10/2018, the ST depressions are slightly more pronounced.  Given history of coronary vasospasm, I would resume amlodipine.  Continue aspirin, atorvastatin, and Imdur.  She has been given 1 dose of IV Lasix 40 mg.  Will assess response to this but will likely need at least an additional dose.  Echocardiogram ordered to assess cardiac structure and function.  No need for IV heparin at this time.    Prentice Docker, MD, [facility]  08/05/2019 4:43 PM

## 2019-08-05 NOTE — ED Notes (Signed)
ED TO INPATIENT HANDOFF REPORT  ED Nurse Name and Phone #:   S Name/Age/Gender [name] 73 y.o. female Room/Bed: 013C/013C  Code Status   Code Status: Prior  Home/SNF/Other Home Patient oriented to: self, place, time and situation Is this baseline? Yes   Triage Complete: Triage complete  Chief Complaint SOB,weakness  Triage Note Pt. Stated, Lavenia Atlas had a headache, back pain and chest pain for 2 weeks. I think My thyroid is acting up.   Allergies Allergies  Allergen Reactions  . Sulfa Drugs Cross Reactors Shortness Of Breath and Palpitations    Level of Care/Admitting Diagnosis ED Disposition    ED Disposition Condition Comment   Admit  Hospital Area: [facility] [[id number]]  Level of Care: Telemetry Cardiac [103]  I expect the patient will be discharged within 24 hours: Yes  LOW acuity---Tx typically complete <24 hrs---ACUTE conditions typically can be evaluated <24 hours---LABS likely to return to acceptable levels <24 hours---IS near functional baseline---EXPECTED to return to current living arrangement---NOT newly hypoxic: Meets criteria for 5C-Observation unit  Covid Evaluation: Person Under Investigation (PUI)  Diagnosis: Chest pain [409811]  Admitting Physician: Sharyon Medicus, ALI Bai.Lain  Attending Physician: Sharyon Medicus, ALI Bai.Lain  PT Class (Do Not Modify): Observation [104]  PT Acc Code (Do Not Modify): Observation [10022]       B Medical/Surgery History Past Medical History:  Diagnosis Date  . Arthritis   . Chronic lower back pain   . Cluster headache   . Coronary artery disease    a. 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 non obs dz LHC 2007. c. Non obs dz (mild-mod) by [facility] 05/17/14 d. cath 05/26/17 mild non obstructive CAD  . Diverticulitis    a. Hx microperf 2012 - hospitalizated.  Marland Kitchen GERD (gastroesophageal reflux disease)   . Hypercholesteremia   . Hypertension   . PVC's (premature ventricular contractions)    a. Hx of trigeminy/bigeminy.  . Seizures  (HCC)   . Thyroid disease    hyperthyroid  . UTI (lower urinary tract infection)    Past Surgical History:  Procedure Laterality Date  . ABDOMINAL HYSTERECTOMY  1987   "partial"-still has ovaries  . CARDIAC CATHETERIZATION  05/17/2014  . CORONARY ANGIOPLASTY WITH STENT PLACEMENT  1995   "1"  . JOINT REPLACEMENT     right knee  . LEFT HEART CATH AND CORONARY ANGIOGRAPHY N/A 05/26/2017   Procedure: Left Heart Cath and Coronary Angiography;  Surgeon: Corky Crafts, MD;  Location: [facility] INVASIVE CV LAB;  Service: Cardiovascular;  Laterality: N/A;  . LEFT HEART CATHETERIZATION WITH CORONARY ANGIOGRAM N/A 05/17/2014   Procedure: LEFT HEART CATHETERIZATION WITH CORONARY ANGIOGRAM;  Surgeon: Marykay Lex, MD;  Location: [facility] CATH LAB;  Service: Cardiovascular;  Laterality: N/A;  . TOTAL KNEE ARTHROPLASTY Right 2005  . TUBAL LIGATION  1970  . VASCULAR SURGERY       A IV Location/Drains/Wounds Patient Lines/Drains/Airways Status   Active Line/Drains/Airways    Name:   Placement date:   Placement time:   Site:   Days:   Peripheral IV 08/05/19 Right Antecubital   08/05/19    1121    Antecubital   less than 1   External Urinary Catheter   08/05/19    1336    -   less than 1          Intake/Output Last 24 hours No intake or output data in the 24 hours ending 08/05/19 1543  Labs/Imaging Results for orders placed or performed during  the hospital encounter of 08/05/19 (from the past 48 hour(s))  Basic metabolic panel     Status: Abnormal   Collection Time: 08/05/19 10:19 AM  Result Value Ref Range   Sodium 135 135 - 145 mmol/L   Potassium 3.7 3.5 - 5.1 mmol/L   Chloride 101 98 - 111 mmol/L   CO2 21 (L) 22 - 32 mmol/L   Glucose, Bld 117 (H) 70 - 99 mg/dL   BUN 12 8 - 23 mg/dL   Creatinine, Ser 1.22 (H) 0.44 - 1.00 mg/dL   Calcium 9.3 8.9 - 10.3 mg/dL   GFR calc non Af Amer 44 (L) >60 mL/min   GFR calc Af Amer 51 (L) >60 mL/min   Anion gap 13 5 - 15    Comment: Performed at [facility] [address] [place], [place] [address]  CBC     Status: Abnormal   Collection Time: 08/05/19 10:19 AM  Result Value Ref Range   WBC 3.8 (L) 4.0 - 10.5 K/uL   RBC 4.75 3.87 - 5.11 MIL/uL   Hemoglobin 14.9 12.0 - 15.0 g/dL   HCT 45.6 36.0 - 46.0 %   MCV 96.0 80.0 - 100.0 fL   MCH 31.4 26.0 - 34.0 pg   MCHC 32.7 30.0 - 36.0 g/dL   RDW 14.0 11.5 - 15.5 %   Platelets 184 150 - 400 K/uL   nRBC 0.0 0.0 - 0.2 %    Comment: Performed at [facility], [place] [address] [place], [place] [address]  Troponin I (High Sensitivity)     Status: Abnormal   Collection Time: 08/05/19 10:19 AM  Result Value Ref Range   Troponin I (High Sensitivity) 54 (H) <18 ng/L    Comment: (NOTE) Elevated high sensitivity troponin I (hsTnI) values and significant  changes across serial measurements may suggest ACS but many other  chronic and acute conditions are known to elevate hsTnI results.  Refer to the "Links" section for chest pain algorithms and additional  guidance. Performed at [facility], [place] [address], [place], [place] [address]   Lipase, blood     Status: None   Collection Time: 08/05/19 11:14 AM  Result Value Ref Range   Lipase 25 11 - 51 U/L    Comment: Performed at [facility] [address], [place], [place] [address]  Brain natriuretic peptide     Status: Abnormal   Collection Time: 08/05/19 11:14 AM  Result Value Ref Range   B Natriuretic Peptide 2,030.7 (H) 0.0 - 100.0 pg/mL    Comment: Performed at [facility] [address], [place], [place] [address]  Lactic acid, plasma     Status: None   Collection Time: 08/05/19 11:14 AM  Result Value Ref Range   Lactic Acid, Venous 1.8 0.5 - 1.9 mmol/L    Comment: Performed at [facility] [address], [place], [place] [address]  Protime-INR     Status: None   Collection Time: 08/05/19 11:14 AM  Result Value Ref Range   Prothrombin Time 14.7 11.4 - 15.2 seconds   INR 1.2 0.8 - 1.2     Comment: (NOTE) INR goal varies based on device and disease states. Performed at [facility], [place] [address], [place],  [address]   TSH     Status: None   Collection Time: 08/05/19 11:14 AM  Result Value Ref Range   TSH 3.101 0.350 - 4.500  uIU/mL    Comment: Performed by a 3rd Generation assay with a functional sensitivity of <=0.01 uIU/mL. Performed at [facility], [address] [place], [place] [address]   Magnesium     Status: None   Collection Time: 08/05/19 11:14 AM  Result Value Ref Range   Magnesium 1.9 1.7 - 2.4 mg/dL    Comment: Performed at [facility], [address] [place], [place] [address]  Phosphorus     Status: None   Collection Time: 08/05/19 11:14 AM  Result Value Ref Range   Phosphorus 3.9 2.5 - 4.6 mg/dL    Comment: Performed at [facility], [address] [place], [place] [address]  Hepatic function panel     Status: Abnormal   Collection Time: 08/05/19 11:14 AM  Result Value Ref Range   Total Protein 8.2 (H) 6.5 - 8.1 g/dL   Albumin 3.9 3.5 - 5.0 g/dL   AST 25 15 - 41 U/L   ALT 24 0 - 44 U/L   Alkaline Phosphatase 77 38 - 126 U/L   Total Bilirubin 1.4 (H) 0.3 - 1.2 mg/dL   Bilirubin, Direct 0.2 0.0 - 0.2 mg/dL   Indirect Bilirubin 1.2 (H) 0.3 - 0.9 mg/dL    Comment: Performed at [facility], [address] [place], [place] [address]  Troponin I (High Sensitivity)     Status: Abnormal   Collection Time: 08/05/19 12:23 PM  Result Value Ref Range   Troponin I (High Sensitivity) 54 (H) <18 ng/L    Comment: (NOTE) Elevated high sensitivity troponin I (hsTnI) values and significant  changes across serial measurements may suggest ACS but many other  chronic and acute conditions are known to elevate hsTnI results.  Refer to the "Links" section for chest pain algorithms and additional  guidance. Performed at [facility], [address] [place], [place] [address]   CBG monitoring, ED     Status:  Abnormal   Collection Time: 08/05/19  2:02 PM  Result Value Ref Range   Glucose-Capillary 100 (H) 70 - 99 mg/dL  Urinalysis, Routine w reflex microscopic     Status: Abnormal   Collection Time: 08/05/19  2:56 PM  Result Value Ref Range   Color, Urine YELLOW YELLOW   APPearance CLEAR CLEAR   Specific Gravity, Urine 1.009 1.005 - 1.030   pH 5.0 5.0 - 8.0   Glucose, UA NEGATIVE NEGATIVE mg/dL   Hgb urine dipstick SMALL (A) NEGATIVE   Bilirubin Urine NEGATIVE NEGATIVE   Ketones, ur NEGATIVE NEGATIVE mg/dL   Protein, ur 13[address] (A) NEGATIVE mg/dL   Nitrite NEGATIVE NEGATIVE   Leukocytes,Ua NEGATIVE NEGATIVE   RBC / HPF 0-5 0 - 5 RBC/hpf   WBC, UA 0-5 0 - 5 WBC/hpf   Bacteria, UA NONE SEEN NONE SEEN   Hyaline Casts, UA PRESENT     Comment: Performed at [facility], [address] [place], [place] [address]   Dg Chest 2 View  Result Date: 08/05/2019 CLINICAL DATA:  Chest pain, back pain EXAM: CHEST - 2 VIEW COMPARISON:  09/29/2017 FINDINGS: Heart size is upper limits of normal, stable. Calcific aortic knob. Rounded 11 mm nodular density projects within the left upper lobe. Right lung appears clear. No pleural effusion. No pneumothorax. IMPRESSION: Rounded 1.1 cm nodular density projecting within the left upper lobe. Recommend CT of the chest for further evaluation. Electronically Signed   By: Vernice JeffersonNicholas  Plundo M.D.  On: 08/05/2019 11:07    Pending Labs Unresulted Labs (From admission, onward)    Start     Ordered   08/05/19 1259  SARS CORONAVIRUS 2 (TAT 6-24 HRS) Nasopharyngeal Nasopharyngeal Swab  (Symptomatic/High Risk of Exposure/Tier 1 Patients Labs with Precautions)  Once,   STAT    Question Answer Comment  Is this test for diagnosis or screening Diagnosis of ill patient   Symptomatic for COVID-19 as defined by CDC Yes   Date of Symptom Onset 07/20/2019   Hospitalized for COVID-19 Yes   Admitted to ICU for COVID-19 No   Previously tested for COVID-19 Yes   Resident in a  congregate (group) care setting No   Employed in healthcare setting No   Pregnant No      08/05/19 1259   08/05/19 1104  T3, free  ONCE - STAT,   STAT     08/05/19 1103   08/05/19 1102  Urine rapid drug screen (hosp performed)  ONCE - STAT,   STAT     08/05/19 1102          Vitals/Pain Today's Vitals   08/05/19 1400 08/05/19 1430 08/05/19 1500 08/05/19 1530  BP: (!) 151/98 (!) 149/93 (!) 148/94 (!) 143/98  Pulse: 67 74 71 68  Resp: 18 (!) 29 17 (!) 21  Temp:      TempSrc:      SpO2: 98% 94% 98% 98%  Weight:      Height:      PainSc:        Isolation Precautions No active isolations  Medications Medications  nitroGLYCERIN (NITROGLYN) 2 % ointment 1 inch (1 inch Topical Given 08/05/19 1240)  sodium chloride flush (NS) 0.9 % injection 3 mL (3 mLs Intravenous Given 08/05/19 1120)  aspirin chewable tablet 324 mg (324 mg Oral Given 08/05/19 1238)  furosemide (LASIX) injection 40 mg (40 mg Intravenous Given 08/05/19 1240)  metoprolol tartrate (LOPRESSOR) injection 5 mg (5 mg Intravenous Given 08/05/19 1303)    Mobility walks with person assist Low fall risk   Focused Assessments Cardiac Assessment Handoff:    Lab Results  Component Value Date   CKTOTAL 60 08/22/2011   CKMB 2.1 08/22/2011   TROPONINI 0.14 (HH) 07/10/2018   Lab Results  Component Value Date   DDIMER 1.42 (H) 05/22/2017   Does the Patient currently have chest pain? No     R Recommendations: See Admitting Provider Note  Report given to:   Additional Notes:

## 2019-08-05 NOTE — ED Triage Notes (Signed)
Pt. Stated, [name] had a headache, back pain and chest pain for 2 weeks. I think My thyroid is acting up.

## 2019-08-06 ENCOUNTER — Other Ambulatory Visit (HOSPITAL_COMMUNITY): Payer: Medicare Other

## 2019-08-06 DIAGNOSIS — R079 Chest pain, unspecified: Secondary | ICD-10-CM | POA: Diagnosis not present

## 2019-08-06 DIAGNOSIS — U071 COVID-19: Secondary | ICD-10-CM

## 2019-08-06 LAB — BASIC METABOLIC PANEL
Anion gap: 14 (ref 5–15)
BUN: 34 mg/dL — ABNORMAL HIGH (ref 8–23)
CO2: 21 mmol/L — ABNORMAL LOW (ref 22–32)
Calcium: 9 mg/dL (ref 8.9–10.3)
Chloride: 100 mmol/L (ref 98–111)
Creatinine, Ser: 1.52 mg/dL — ABNORMAL HIGH (ref 0.44–1.00)
GFR calc Af Amer: 39 mL/min — ABNORMAL LOW (ref >60)
GFR calc non Af Amer: 34 mL/min — ABNORMAL LOW (ref >60)
Glucose, Bld: 168 mg/dL — ABNORMAL HIGH (ref 70–99)
Potassium: 3.2 mmol/L — ABNORMAL LOW (ref 3.5–5.1)
Sodium: 135 mmol/L (ref 135–145)

## 2019-08-06 LAB — TROPONIN I (HIGH SENSITIVITY)
Troponin I (High Sensitivity): 61 ng/L — ABNORMAL HIGH (ref <18)
Troponin I (High Sensitivity): 86 ng/L — ABNORMAL HIGH (ref <18)
Troponin I (High Sensitivity): 77 ng/L — ABNORMAL HIGH (ref <18)

## 2019-08-06 LAB — T3, FREE: T3, Free: 2.7 pg/mL (ref 2.0–4.4)

## 2019-08-06 LAB — MRSA PCR SCREENING: MRSA by PCR: NEGATIVE

## 2019-08-06 MED ORDER — ZINC SULFATE 220 (50 ZN) MG PO CAPS
220.0000 mg | ORAL_CAPSULE | Freq: Every day | ORAL | Status: DC
  Administered 2019-08-06 – 2019-08-14 (×9): 220 mg via ORAL
  Filled 2019-08-06 (×9): qty 1

## 2019-08-06 MED ORDER — NITROGLYCERIN 2 % TD OINT
1.0000 [in_us] | TOPICAL_OINTMENT | Freq: Four times a day (QID) | TRANSDERMAL | Status: DC
  Filled 2019-08-06: qty 30

## 2019-08-06 MED ORDER — POTASSIUM CHLORIDE CRYS ER 20 MEQ PO TBCR
40.0000 meq | EXTENDED_RELEASE_TABLET | Freq: Once | ORAL | Status: AC
  Administered 2019-08-06: 40 meq via ORAL
  Filled 2019-08-06: qty 2

## 2019-08-06 MED ORDER — VITAMIN C 500 MG PO TABS
500.0000 mg | ORAL_TABLET | Freq: Two times a day (BID) | ORAL | Status: DC
  Administered 2019-08-06 – 2019-08-14 (×17): 500 mg via ORAL
  Filled 2019-08-06 (×17): qty 1

## 2019-08-06 MED ORDER — MORPHINE SULFATE (PF) 2 MG/ML IV SOLN
2.0000 mg | Freq: Once | INTRAVENOUS | Status: AC
  Administered 2019-08-06: 2 mg via INTRAVENOUS
  Filled 2019-08-06: qty 1

## 2019-08-06 MED ORDER — NITROGLYCERIN 2 % TD OINT
1.0000 [in_us] | TOPICAL_OINTMENT | Freq: Four times a day (QID) | TRANSDERMAL | Status: DC
  Administered 2019-08-06 – 2019-08-09 (×11): 1 [in_us] via TOPICAL
  Filled 2019-08-06: qty 30

## 2019-08-06 NOTE — TOC Initial Note (Signed)
Transition of Care [facility]) - Initial/Assessment Note    Patient Details  Name: [name] MRN: [id number] Date of Birth: 14-Mar-1946  [facility] of [facility]) CM/SW Contact:    Epifanio Lesches, RN Phone Number: 08/06/2019, 4:25 PM  Clinical Narrative:         Presented with CP, hx of cad, s/p cardiac cath with nonobstructive lesions in 8/18, hypertension, hyperlipidemia. NCM spoke with pt via phone regarding TOC needs. Pt states live alone. Supportive family. Son lives in [place]. Daughter lives in [place] and is mom's point person while in hospital. Daughter states someone in the family checks on mom daily. Prior to hospital visit independent with ADL's, no DME usage. Owns walker.  [name] (Daughter)      [phone]        COVID positive, 08/05/2019.   TOC team will continue to monitor and follow ....  Expected Discharge Plan: Home/Self Care(Lives alone. Son checks on mom daily.) Barriers to Discharge: Continued Medical Work up   Patient Goals and CMS Choice Patient states their goals for this hospitalization and ongoing recovery are:: to go home and take care of myself      Expected Discharge Plan and Services Expected Discharge Plan: Home/Self Care(Lives alone. Son checks on mom daily.)   Discharge Planning Services: CM Consult   Living arrangements for the past 2 months: Single Family Home                                      Prior Living Arrangements/Services Living arrangements for the past 2 months: Single Family Home Lives with:: Self Patient language and need for interpreter reviewed:: Yes Do you feel safe going back to the place where you live?: Yes      Need for Family Participation in Patient Care: No (Comment) Care giver support system in place?: No (comment)   Criminal Activity/Legal Involvement Pertinent to Current Situation/Hospitalization: No - Comment as needed  Activities of Daily Living Home Assistive Devices/Equipment: Cane  (specify quad or straight), Dentures (specify type), Walker (specify type), Shower chair with back ADL Screening (condition at time of admission) Patient's cognitive ability adequate to safely complete daily activities?: Yes Is the patient deaf or have difficulty hearing?: No Does the patient have difficulty seeing, even when wearing glasses/contacts?: No Does the patient have difficulty concentrating, remembering, or making decisions?: No Patient able to express need for assistance with ADLs?: Yes Does the patient have difficulty dressing or bathing?: Yes Independently performs ADLs?: No Communication: Independent Dressing (OT): Needs assistance Is this a change from baseline?: Pre-admission baseline Grooming: Needs assistance Is this a change from baseline?: Pre-admission baseline Feeding: Independent Bathing: Needs assistance Is this a change from baseline?: Pre-admission baseline Toileting: Needs assistance Is this a change from baseline?: Pre-admission baseline In/Out Bed: Needs assistance Is this a change from baseline?: Pre-admission baseline Walks in Home: Needs assistance(uses a cane at times) Is this a change from baseline?: Change from baseline, expected to last <3 days Does the patient have difficulty walking or climbing stairs?: Yes Weakness of Legs: Both Weakness of Arms/Hands: Both  Permission Sought/Granted Permission sought to share information with : Case Manager, Family Supports Permission granted to share information with : Yes, Verbal Permission Granted  Share Information with NAME: [name] ([phone]           Emotional Assessment Appearance:: Appears stated age Attitude/Demeanor/Rapport: Engaged Affect (typically observed):  Accepting Orientation: : Oriented to Self, Oriented to Place Alcohol / Substance Use: Not Applicable Psych Involvement: No (comment)  Admission diagnosis:  ACS (acute coronary syndrome) ([place]) [I24.9] Acute  congestive heart failure, unspecified heart failure type ([place]) [I50.9] Chest pain [R07.9] Patient Active Problem List   Diagnosis Date Noted  . COVID-19 virus infection 08/06/2019  . Chest pain 08/05/2019  . Anemia 12/22/2017  . Demand ischemia ([place])   . Hyperthyroidism 05/22/2017  . Dark stools 05/22/2017  . Protein-calorie malnutrition, moderate ([place]) 05/22/2017  . Overactive bladder 09/19/2014  . Coronary artery disease involving native coronary artery of native heart with angina pectoris ([place])   . Seizures ([place])   . Arthritis   . Hypercholesteremia   . GERD (gastroesophageal reflux disease) 10/04/2011  . HTN (hypertension) 10/01/2011   PCP:  Marin Olp, MD Pharmacy:   [facility] [place] [place] [address] Phone: [phone] Fax: [phone]     Social Determinants of Health (SDOH) Interventions    Readmission Risk Interventions No flowsheet data found.

## 2019-08-06 NOTE — ED Notes (Signed)
Spoke with patients daughter [name] by phone at [phone] and have provided the daughter with MD name and has sent hospitalist message to please update daughter by phone. I have contacted [name] to please call daughter as well as daughters request

## 2019-08-06 NOTE — ED Notes (Signed)
Tele +covid  Breakfast ordered

## 2019-08-06 NOTE — Progress Notes (Signed)
08/06/2019 Patient transfer from the emergency room to 2west  At 11:17 am. She is alert, oriented and ambulatory with standby assist. Patient skin intact heels are dry. Patient receive MRSA swab and was placed on telemetry [facility].

## 2019-08-06 NOTE — Care Management Obs Status (Signed)
[facility] NOTIFICATION   Patient Details  Name: [name] MRN: [id number] Date of Birth: 1946-07-27   Medicare Observation Status Notification Given:  Yes    Sharin Mons, RN 08/06/2019, 4:09 PM

## 2019-08-06 NOTE — Progress Notes (Signed)
COVID positive. Echocardiogram pending. Symptoms likely secondary to COVID. Troponins insignificantly elevated. Will follow remotely. Please call with questions.

## 2019-08-06 NOTE — Progress Notes (Addendum)
PROGRESS NOTE    [name]  KDT:[id number] DOB: 04/22/1946 DOA: 08/05/2019 PCP: Marin Olp, MD   Brief Narrative:  Patient is a 73 year old female with history of coronary artery disease, status post cardiac cath with nonobstructive lesions in 8/18,history of hypertension, hyperlipidemia who presents to the emergency department with complaints of tightness in the left chest radiating to the left arm, worsening shortness of breath.  She reported history of exposure to COVID about 2 weeks ago but tested negative for COVID about a week ago.  She complained of general weakness on presentation.  On presentation, her BNP was elevated 2000 and had mild elevated troponins.  EKG showed ST depression in the inferolateral leads.  Cardiology consulted.  Awaiting echocardiogram.  She is currently COVID symptom-free except for generalized weaknessand is saturating fine on room air.   Assessment & Plan:   Active Problems:   Chest pain   Atypical chest pain/suspected acute coronary syndrome: Currently chest pain-free.  Presented with shortness of breath, left chest pain with radiation to the left arm.  Has mild troponin elevation but the trend is flat.  Denied any chest pain this morning.  EKG showed ST depression in the inferolateral leads.  Heparin drip not started.  Cardiology following.  Awaiting echocardiogram  COVID-19: Presented with shortness of breath but it could be from possible congestive heart failure.  Currently denies any dyspnea.  Saturating fine on room air.  Chest x-ray did not show any infiltrate.  Afebrile.  Will monitor inflammatory markers.  Does not meet criteria for steroids or remedesivir.  Continue vitamin C and zinc  History of coronary artery disease:history of coronary artery disease, status post cardiac cath with nonobstructive lesions in 8/18.  Continue home medicines.  Suspected congestive heart failure: Presented with shortness of breath, elevated BNP on  presentation.  No pneumonia or pleural effusion or pulmonary edema as per chest x-ray.  Follow-up echocardiogram.  She was given a dose of Lasix yesterday.  Currently looks euvolemic.  Left upper lobe nodule: Chest x-ray showed rounded 1.1 cm nodular density projecting within the left upperlobe. Recommend CT of the chest for further evaluation which will be done as an outpatient after dilatation of COVID.  She is a non-smoker.  Hypokalemia: Supplemented with potassium  AKI: Mild.  Creatinine slightly tented up today.  Will check BMP tomorrow.  She was given a dose of Lasix yesterday.  Hyperthyroidism: History of Graves' disease/multinodular goiter.  Follows with endocrinology.  On methimazole and propanolol at home which we will continue.  Hypertension: Currently blood pressure stable.  Continue current medicines.  Amlodipine added  GERD: Continue Protonix           DVT prophylaxis:Lovenox Code Status: Full Family Communication: called daughter on phone on 08/06/19 Disposition Plan: Home after cardiology clearance   Consultants: Cardiology  Procedures:None  Antimicrobials:  Anti-infectives (From admission, onward)   None      Subjective:  Patient seen and examined the bedside this morning.  Hemodynamically stable.  Afebrile.  Denies any chest pain or shortness of breath this morning.  Saturating fine on room air.  Complains of generalized weakness.  Objective: Vitals:   08/06/19 0800 08/06/19 0900 08/06/19 1000 08/06/19 1117  BP: (!) 145/107 (!) 164/91 (!) 178/90 (!) 104/56  Pulse:    68  Resp: 19 (!) 23 (!) 24 16  Temp:    99.6 F (37.6 C)  TempSrc:    Oral  SpO2:    91%  Weight:  Height:       No intake or output data in the 24 hours ending 08/06/19 1252 Filed Weights   08/05/19 1306  Weight: 61.2 kg    Examination:  General exam: Appears calm and comfortable ,Not in distress,average built HEENT:PERRL,Oral mucosa moist, Ear/Nose normal on gross  exam Respiratory system: Bilateral equal air entry, normal vesicular breath sounds, no wheezes or crackles  Cardiovascular system: S1 & S2 heard, RRR. No JVD, murmurs, rubs, gallops or clicks. No pedal edema. Gastrointestinal system: Abdomen is nondistended, soft and nontender. No organomegaly or masses felt. Normal bowel sounds heard. Central nervous system: Alert and oriented. No focal neurological deficits. Extremities: No edema, no clubbing ,no cyanosis, distal peripheral pulses palpable. Skin: No rashes, lesions or ulcers,no icterus ,no pallor     Data Reviewed: I have personally reviewed following labs and imaging studies  CBC: Recent Labs  Lab 08/05/19 1019  WBC 3.8*  HGB 14.9  HCT 45.6  MCV 96.0  PLT 184   Basic Metabolic Panel: Recent Labs  Lab 08/05/19 1019 08/05/19 1114 08/06/19 0903  NA 135  --  135  K 3.7  --  3.2*  CL 101  --  100  CO2 21*  --  21*  GLUCOSE 117*  --  168*  BUN 12  --  34*  CREATININE 1.22*  --  1.52*  CALCIUM 9.3  --  9.0  MG  --  1.9  --   PHOS  --  3.9  --    GFR: Estimated Creatinine Clearance: 31.3 mL/min (A) (by C-G formula based on SCr of 1.52 mg/dL (H)). Liver Function Tests: Recent Labs  Lab 08/05/19 1114  AST 25  ALT 24  ALKPHOS 77  BILITOT 1.4*  PROT 8.2*  ALBUMIN 3.9   Recent Labs  Lab 08/05/19 1114  LIPASE 25   No results for input(s): AMMONIA in the last 168 hours. Coagulation Profile: Recent Labs  Lab 08/05/19 1114  INR 1.2   Cardiac Enzymes: No results for input(s): CKTOTAL, CKMB, CKMBINDEX, TROPONINI in the last 168 hours. BNP (last 3 results) No results for input(s): PROBNP in the last 8760 hours. HbA1C: No results for input(s): HGBA1C in the last 72 hours. CBG: Recent Labs  Lab 08/05/19 1402  GLUCAP 100*   Lipid Profile: No results for input(s): CHOL, HDL, LDLCALC, TRIG, CHOLHDL, LDLDIRECT in the last 72 hours. Thyroid Function Tests: Recent Labs    08/05/19 1114  TSH 3.101   Anemia  Panel: No results for input(s): VITAMINB12, FOLATE, FERRITIN, TIBC, IRON, RETICCTPCT in the last 72 hours. Sepsis Labs: Recent Labs  Lab 08/05/19 1114  LATICACIDVEN 1.8    Recent Results (from the past 240 hour(s))  SARS CORONAVIRUS 2 (TAT 6-24 HRS) Nasopharyngeal Nasopharyngeal Swab     Status: Abnormal   Collection Time: 08/05/19 12:59 PM   Specimen: Nasopharyngeal Swab  Result Value Ref Range Status   SARS Coronavirus 2 POSITIVE (A) NEGATIVE Final    Comment: RESULT CALLED TO, READ BACK BY AND VERIFIED WITH: B OSORIO,RN AT 2016 08/05/2019 BY L BENFIELD (NOTE) SARS-CoV-2 target nucleic acids are DETECTED. The SARS-CoV-2 RNA is generally detectable in upper and lower respiratory specimens during the acute phase of infection. Positive results are indicative of active infection with SARS-CoV-2. Clinical  correlation with patient history and other diagnostic information is necessary to determine patient infection status. Positive results do  not rule out bacterial infection or co-infection with other viruses. The expected result is Negative. Fact Sheet  for Patients: HairSlick.no Fact Sheet for Healthcare Providers: quierodirigir.com This test is not yet approved or cleared by the [place] FDA and  has been authorized for detection and/or diagnosis of SARS-CoV-2 by FDA under an Emergency Use Authorization (EUA). This EUA will remain  in effect (meaning this test can be [place] ed) for the duration of the COVID-19 declaration under Section 564(b)(1) of the Act, 21 U.S.C. section 360bbb-3(b)(1), unless the authorization is terminated or revoked sooner. Performed at [facility], [address], [place], [place] [address]          Radiology Studies: Dg Chest 2 View  Result Date: 08/05/2019 CLINICAL DATA:  Chest pain, back pain EXAM: CHEST - 2 VIEW COMPARISON:  09/29/2017 FINDINGS: Heart size is upper limits of  normal, stable. Calcific aortic knob. Rounded 11 mm nodular density projects within the left upper lobe. Right lung appears clear. No pleural effusion. No pneumothorax. IMPRESSION: Rounded 1.1 cm nodular density projecting within the left upper lobe. Recommend CT of the chest for further evaluation. Electronically Signed   By: Duanne Guess M.D.   On: 08/05/2019 11:07        Scheduled Meds: . amLODipine  10 mg Oral Daily  . aspirin  325 mg Oral QODAY  . atorvastatin  20 mg Oral Daily  . enoxaparin (LOVENOX) injection  40 mg Subcutaneous Daily  . ezetimibe  10 mg Oral Daily  . ferrous sulfate  325 mg Oral Q breakfast  . isosorbide mononitrate  120 mg Oral Daily  . methimazole  40 mg Oral BID  . nitroGLYCERIN  1 inch Topical Q6H  . oxybutynin  10 mg Oral Daily  . pantoprazole  40 mg Oral Daily  . potassium chloride  40 mEq Oral Once  . propranolol  60 mg Oral BID  . sodium chloride flush  3 mL Intravenous Q12H  . venlafaxine XR  150 mg Oral Q breakfast   Continuous Infusions: . sodium chloride       LOS: 0 days    Time spent: 35 mins.More than 50% of that time was spent in counseling and/or coordination of care.      Burnadette Pop, MD [facility] Pager [phone]  If 7PM-7AM, please contact night-coverage [email or web address] Password [email or web address] 08/06/2019, 12:52 PM

## 2019-08-06 NOTE — Progress Notes (Addendum)
08/06/2019 Patient c/o chest heaviness not able to swallow and headache of 10 1810. 12 lead EKG was ordered, placed on nasal canula 2 liters. Patient stated chest pain was a 7. She was give a 0.4 mg sublingual nitro at 1836 and Dr Tawanna Solo was notified and a one time does of Morphine 2 mg iv was given. Patient was also given Tylenol 650 mg for her headache. Patient blood pressure was 114/74 and vital a 1903 was temp 99.0, BP 104/60, HR 67, RR, 18, Saturation 98% 2L [place].Patient pain was reassess at 1910 it was a 5. [facility] RN.

## 2019-08-07 ENCOUNTER — Inpatient Hospital Stay (HOSPITAL_COMMUNITY): Payer: Medicare Other

## 2019-08-07 ENCOUNTER — Observation Stay (HOSPITAL_COMMUNITY): Payer: Medicare Other

## 2019-08-07 DIAGNOSIS — D6959 Other secondary thrombocytopenia: Secondary | ICD-10-CM | POA: Diagnosis present

## 2019-08-07 DIAGNOSIS — I10 Essential (primary) hypertension: Secondary | ICD-10-CM | POA: Diagnosis not present

## 2019-08-07 DIAGNOSIS — N390 Urinary tract infection, site not specified: Secondary | ICD-10-CM | POA: Diagnosis present

## 2019-08-07 DIAGNOSIS — I361 Nonrheumatic tricuspid (valve) insufficiency: Secondary | ICD-10-CM | POA: Diagnosis not present

## 2019-08-07 DIAGNOSIS — R0789 Other chest pain: Secondary | ICD-10-CM | POA: Diagnosis present

## 2019-08-07 DIAGNOSIS — I083 Combined rheumatic disorders of mitral, aortic and tricuspid valves: Secondary | ICD-10-CM | POA: Diagnosis present

## 2019-08-07 DIAGNOSIS — R079 Chest pain, unspecified: Secondary | ICD-10-CM

## 2019-08-07 DIAGNOSIS — B9620 Unspecified Escherichia coli [E. coli] as the cause of diseases classified elsewhere: Secondary | ICD-10-CM | POA: Diagnosis present

## 2019-08-07 DIAGNOSIS — N179 Acute kidney failure, unspecified: Secondary | ICD-10-CM | POA: Diagnosis not present

## 2019-08-07 DIAGNOSIS — R5381 Other malaise: Secondary | ICD-10-CM | POA: Diagnosis present

## 2019-08-07 DIAGNOSIS — E042 Nontoxic multinodular goiter: Secondary | ICD-10-CM | POA: Diagnosis present

## 2019-08-07 DIAGNOSIS — K5900 Constipation, unspecified: Secondary | ICD-10-CM | POA: Diagnosis not present

## 2019-08-07 DIAGNOSIS — I472 Ventricular tachycardia: Secondary | ICD-10-CM | POA: Diagnosis not present

## 2019-08-07 DIAGNOSIS — I110 Hypertensive heart disease with heart failure: Secondary | ICD-10-CM | POA: Diagnosis present

## 2019-08-07 DIAGNOSIS — I5043 Acute on chronic combined systolic (congestive) and diastolic (congestive) heart failure: Secondary | ICD-10-CM | POA: Diagnosis not present

## 2019-08-07 DIAGNOSIS — R269 Unspecified abnormalities of gait and mobility: Secondary | ICD-10-CM | POA: Diagnosis present

## 2019-08-07 DIAGNOSIS — K5909 Other constipation: Secondary | ICD-10-CM | POA: Diagnosis present

## 2019-08-07 DIAGNOSIS — I25119 Atherosclerotic heart disease of native coronary artery with unspecified angina pectoris: Secondary | ICD-10-CM | POA: Diagnosis not present

## 2019-08-07 DIAGNOSIS — K219 Gastro-esophageal reflux disease without esophagitis: Secondary | ICD-10-CM | POA: Diagnosis present

## 2019-08-07 DIAGNOSIS — I371 Nonrheumatic pulmonary valve insufficiency: Secondary | ICD-10-CM | POA: Diagnosis present

## 2019-08-07 DIAGNOSIS — U071 COVID-19: Secondary | ICD-10-CM | POA: Diagnosis not present

## 2019-08-07 DIAGNOSIS — D72819 Decreased white blood cell count, unspecified: Secondary | ICD-10-CM | POA: Diagnosis present

## 2019-08-07 DIAGNOSIS — I340 Nonrheumatic mitral (valve) insufficiency: Secondary | ICD-10-CM | POA: Diagnosis not present

## 2019-08-07 DIAGNOSIS — R911 Solitary pulmonary nodule: Secondary | ICD-10-CM | POA: Diagnosis present

## 2019-08-07 DIAGNOSIS — E0500 Thyrotoxicosis with diffuse goiter without thyrotoxic crisis or storm: Secondary | ICD-10-CM | POA: Diagnosis present

## 2019-08-07 DIAGNOSIS — E876 Hypokalemia: Secondary | ICD-10-CM | POA: Diagnosis present

## 2019-08-07 DIAGNOSIS — E0590 Thyrotoxicosis, unspecified without thyrotoxic crisis or storm: Secondary | ICD-10-CM | POA: Diagnosis not present

## 2019-08-07 DIAGNOSIS — E785 Hyperlipidemia, unspecified: Secondary | ICD-10-CM | POA: Diagnosis present

## 2019-08-07 DIAGNOSIS — I248 Other forms of acute ischemic heart disease: Secondary | ICD-10-CM | POA: Diagnosis not present

## 2019-08-07 DIAGNOSIS — I493 Ventricular premature depolarization: Secondary | ICD-10-CM | POA: Diagnosis present

## 2019-08-07 HISTORY — DX: COVID-19: U07.1

## 2019-08-07 LAB — COMPREHENSIVE METABOLIC PANEL
ALT: 16 U/L (ref 0–44)
AST: 27 U/L (ref 15–41)
Albumin: 3.3 g/dL — ABNORMAL LOW (ref 3.5–5.0)
Alkaline Phosphatase: 58 U/L (ref 38–126)
Anion gap: 12 (ref 5–15)
BUN: 51 mg/dL — ABNORMAL HIGH (ref 8–23)
CO2: 19 mmol/L — ABNORMAL LOW (ref 22–32)
Calcium: 8.5 mg/dL — ABNORMAL LOW (ref 8.9–10.3)
Chloride: 103 mmol/L (ref 98–111)
Creatinine, Ser: 2.65 mg/dL — ABNORMAL HIGH (ref 0.44–1.00)
GFR calc Af Amer: 20 mL/min — ABNORMAL LOW (ref >60)
GFR calc non Af Amer: 17 mL/min — ABNORMAL LOW (ref >60)
Glucose, Bld: 127 mg/dL — ABNORMAL HIGH (ref 70–99)
Potassium: 4.3 mmol/L (ref 3.5–5.1)
Sodium: 134 mmol/L — ABNORMAL LOW (ref 135–145)
Total Bilirubin: 0.9 mg/dL (ref 0.3–1.2)
Total Protein: 6.8 g/dL (ref 6.5–8.1)

## 2019-08-07 LAB — CBC WITH DIFFERENTIAL/PLATELET
Abs Immature Granulocytes: 0.01 10*3/uL (ref 0.00–0.07)
Basophils Absolute: 0 10*3/uL (ref 0.0–0.1)
Basophils Relative: 0 %
Eosinophils Absolute: 0 10*3/uL (ref 0.0–0.5)
Eosinophils Relative: 0 %
HCT: 39 % (ref 36.0–46.0)
Hemoglobin: 13.2 g/dL (ref 12.0–15.0)
Immature Granulocytes: 0 %
Lymphocytes Relative: 19 %
Lymphs Abs: 0.7 10*3/uL (ref 0.7–4.0)
MCH: 32.4 pg (ref 26.0–34.0)
MCHC: 33.8 g/dL (ref 30.0–36.0)
MCV: 95.6 fL (ref 80.0–100.0)
Monocytes Absolute: 0.3 10*3/uL (ref 0.1–1.0)
Monocytes Relative: 8 %
Neutro Abs: 2.5 10*3/uL (ref 1.7–7.7)
Neutrophils Relative %: 73 %
Platelets: 104 10*3/uL — ABNORMAL LOW (ref 150–400)
RBC: 4.08 MIL/uL (ref 3.87–5.11)
RDW: 13.9 % (ref 11.5–15.5)
WBC: 3.5 10*3/uL — ABNORMAL LOW (ref 4.0–10.5)
nRBC: 0 % (ref 0.0–0.2)

## 2019-08-07 LAB — ECHOCARDIOGRAM COMPLETE
Height: 66 in
Weight: 2130.53 oz

## 2019-08-07 LAB — BRAIN NATRIURETIC PEPTIDE: B Natriuretic Peptide: 203.3 pg/mL — ABNORMAL HIGH (ref 0.0–100.0)

## 2019-08-07 LAB — MAGNESIUM: Magnesium: 2 mg/dL (ref 1.7–2.4)

## 2019-08-07 LAB — D-DIMER, QUANTITATIVE: D-Dimer, Quant: 0.66 ug/mL-FEU — ABNORMAL HIGH (ref 0.00–0.50)

## 2019-08-07 LAB — LACTATE DEHYDROGENASE: LDH: 220 U/L — ABNORMAL HIGH (ref 98–192)

## 2019-08-07 LAB — LACTIC ACID, PLASMA
Lactic Acid, Venous: 1.2 mmol/L (ref 0.5–1.9)
Lactic Acid, Venous: 0.9 mmol/L (ref 0.5–1.9)

## 2019-08-07 LAB — FERRITIN: Ferritin: 332 ng/mL — ABNORMAL HIGH (ref 11–307)

## 2019-08-07 LAB — C-REACTIVE PROTEIN: CRP: 6 mg/dL — ABNORMAL HIGH (ref <1.0)

## 2019-08-07 MED ORDER — METOPROLOL SUCCINATE ER 25 MG PO TB24
25.0000 mg | ORAL_TABLET | Freq: Every day | ORAL | Status: DC
  Administered 2019-08-07 – 2019-08-10 (×4): 25 mg via ORAL
  Filled 2019-08-07 (×4): qty 1

## 2019-08-07 MED ORDER — ACETAMINOPHEN 325 MG PO TABS
650.0000 mg | ORAL_TABLET | Freq: Once | ORAL | Status: AC
  Administered 2019-08-07: 650 mg via ORAL
  Filled 2019-08-07: qty 2

## 2019-08-07 MED ORDER — ENOXAPARIN SODIUM 30 MG/0.3ML ~~LOC~~ SOLN: 30.0000 mg | Freq: Every day | SUBCUTANEOUS | Status: DC

## 2019-08-07 MED ORDER — SODIUM CHLORIDE 0.9 % IV BOLUS
250.0000 mL | Freq: Once | INTRAVENOUS | Status: AC
  Administered 2019-08-07: 250 mL via INTRAVENOUS

## 2019-08-07 MED ORDER — AMLODIPINE BESYLATE 5 MG PO TABS
2.5000 mg | ORAL_TABLET | Freq: Every day | ORAL | Status: DC
  Administered 2019-08-08 – 2019-08-14 (×7): 2.5 mg via ORAL
  Filled 2019-08-07 (×7): qty 1

## 2019-08-07 MED ORDER — SODIUM BICARBONATE 650 MG PO TABS
650.0000 mg | ORAL_TABLET | Freq: Two times a day (BID) | ORAL | Status: DC
  Administered 2019-08-07 – 2019-08-08 (×3): 650 mg via ORAL
  Filled 2019-08-07 (×3): qty 1

## 2019-08-07 MED ORDER — SODIUM CHLORIDE 0.9 % IV SOLN
INTRAVENOUS | Status: DC
  Administered 2019-08-07 (×2): via INTRAVENOUS

## 2019-08-07 NOTE — Progress Notes (Signed)
  Echocardiogram 2D Echocardiogram has been performed.  [name] 08/07/2019, 9:28 AM

## 2019-08-07 NOTE — Progress Notes (Signed)
[name] from Lab called and stated patient's platelets were 184 yesterday and today they are 104.  [place] RN notified.

## 2019-08-07 NOTE — Progress Notes (Addendum)
Progress Note  Patient Name: [name] Date of Encounter: 08/07/2019  Primary Cardiologist: Lyn Records III, MD   Subjective   No chest pain and no SOB today.  Feels better than yesterday.   Inpatient Medications    Scheduled Meds: . aspirin  325 mg Oral QODAY  . atorvastatin  20 mg Oral Daily  . [START ON 08/08/2019] enoxaparin (LOVENOX) injection  30 mg Subcutaneous Daily  . ezetimibe  10 mg Oral Daily  . ferrous sulfate  325 mg Oral Q breakfast  . methimazole  40 mg Oral BID  . nitroGLYCERIN  1 inch Topical Q6H  . oxybutynin  10 mg Oral Daily  . pantoprazole  40 mg Oral Daily  . propranolol  60 mg Oral BID  . sodium bicarbonate  650 mg Oral BID  . sodium chloride flush  3 mL Intravenous Q12H  . venlafaxine XR  150 mg Oral Q breakfast  . vitamin C  500 mg Oral BID  . zinc sulfate  220 mg Oral Daily   Continuous Infusions: . sodium chloride    . sodium chloride 100 mL/hr at 08/07/19 1010   PRN Meds: sodium chloride, acetaminophen **OR** acetaminophen, HYDROcodone-acetaminophen, loperamide, nitroGLYCERIN, ondansetron **OR** ondansetron (ZOFRAN) IV, sodium chloride flush   Vital Signs    Vitals:   08/07/19 0015 08/07/19 0209 08/07/19 0450 08/07/19 0918  BP: 107/64  (!) 98/56 119/72  Pulse: 69  71 63  Resp: 16  16 16   Temp: (!) 102.4 F (39.1 C) (!) 101.6 F (38.7 C) (!) 101.1 F (38.4 C) 98.9 F (37.2 C)  TempSrc: Oral Oral Oral Oral  SpO2: 97%  97% 95%  Weight:      Height:        Intake/Output Summary (Last 24 hours) at 08/07/2019 1422 Last data filed at 08/07/2019 1010 Gross per 24 hour  Intake 734.62 ml  Output 1 ml  Net 733.62 ml   Last 3 Weights 08/06/2019 08/05/2019 12/26/2018  Weight (lbs) 133 lb 2.5 oz 135 lb 137 lb  Weight (kg) 60.4 kg 61.236 kg 62.143 kg      Telemetry    SR with short PR occ PVC - Personally Reviewed  ECG    SR with PVC and some ST depression but improved from admit - Personally Reviewed  Physical Exam  Per  Dr. 02/25/2019  GEN: No acute distress.   Neck: No JVD Cardiac: RRR, no murmurs, rubs, or gallops.  Respiratory: Clear to auscultation bilaterally. GI: Soft, nontender, non-distended  MS: No edema; No deformity. Neuro:  Nonfocal  Psych: Normal affect   Labs    High Sensitivity Troponin:   Recent Labs  Lab 08/05/19 1019 08/05/19 1223 08/06/19 0615 08/06/19 2003 08/06/19 2157  TROPONINIHS 54* 54* 61* 86* 77*      Chemistry Recent Labs  Lab 08/05/19 1019 08/05/19 1114 08/06/19 0903 08/07/19 0529  NA 135  --  135 134*  K 3.7  --  3.2* 4.3  CL 101  --  100 103  CO2 21*  --  21* 19*  GLUCOSE 117*  --  168* 127*  BUN 12  --  34* 51*  CREATININE 1.22*  --  1.52* 2.65*  CALCIUM 9.3  --  9.0 8.5*  PROT  --  8.2*  --  6.8  ALBUMIN  --  3.9  --  3.3*  AST  --  25  --  27  ALT  --  24  --  16  ALKPHOS  --  77  --  58  BILITOT  --  1.4*  --  0.9  GFRNONAA 44*  --  34* 17*  GFRAA 51*  --  39* 20*  ANIONGAP 13  --  14 12     Hematology Recent Labs  Lab 08/05/19 1019 08/07/19 0529  WBC 3.8* 3.5*  RBC 4.75 4.08  HGB 14.9 13.2  HCT 45.6 39.0  MCV 96.0 95.6  MCH 31.4 32.4  MCHC 32.7 33.8  RDW 14.0 13.9  PLT 184 104*    BNP Recent Labs  Lab 08/05/19 1114 08/07/19 0529  BNP 2,030.7* 203.3*     DDimer  Recent Labs  Lab 08/07/19 0529  DDIMER 0.66*     Radiology    Dg Chest Port 1 View  Result Date: 08/07/2019 CLINICAL DATA:  Chest pain.  COVID-19 positive EXAM: PORTABLE CHEST 1 VIEW COMPARISON:  Two days ago FINDINGS: Normal heart size for portable technique. Minimal atelectasis at the right base. There is no edema, consolidation, effusion, or pneumothorax. Nodular density highlighted on prior is not clearly seen today. IMPRESSION: 1. No evidence of pneumonia. 2. Minimal atelectasis. Electronically Signed   By: Monte Fantasia M.D.   On: 08/07/2019 06:42    Cardiac Studies   Echo 08/07/19  IMPRESSIONS    1. Left ventricular ejection fraction, by  visual estimation, is 35 to 40%. The left ventricle has moderately decreased function. Normal left ventricular size. There is mildly increased left ventricular hypertrophy.  2. Elevated mean left atrial pressure.  3. Left ventricular diastolic Doppler parameters are consistent with pseudonormalization pattern of LV diastolic filling.  4. Global right ventricle has normal systolic function.The right ventricular size is normal.  5. Left atrial size was severely dilated.  6. Right atrial size was normal.  7. The mitral valve is normal in structure. Moderate mitral valve regurgitation. No evidence of mitral stenosis.  8. The tricuspid valve is normal in structure. Tricuspid valve regurgitation is mild.  9. The aortic valve is tricuspid Aortic valve regurgitation was not visualized by color flow Doppler. Mild aortic valve sclerosis without stenosis. 10. The pulmonic valve was normal in structure. Pulmonic valve regurgitation is trivial by color flow Doppler. 11. The inferior vena cava is normal in size with greater than 50% respiratory variability, suggesting right atrial pressure of 3 mmHg. 12. Moderate global reduction in LV systolic function; mild LVH; grade 2 diastolic dysfunction; moderate MR; severe LAE.  FINDINGS  Left Ventricle: Left ventricular ejection fraction, by visual estimation, is 35 to 40%. The left ventricle has moderately decreased function. There is mildly increased left ventricular hypertrophy. Normal left ventricular size. Spectral Doppler shows  Left ventricular diastolic Doppler parameters are consistent with pseudonormalization pattern of LV diastolic filling. Elevated mean left atrial pressure.  Right Ventricle: The right ventricular size is normal. Global RV systolic function is has normal systolic function. The tricuspid regurgitant velocity is 2.38 m/s, and with an assumed right atrial pressure of 3 mmHg, the estimated right ventricular  systolic pressure is normal at 25.7  mmHg.  Left Atrium: Left atrial size was severely dilated.  Right Atrium: Right atrial size was normal in size  Pericardium: There is no evidence of pericardial effusion.  Mitral Valve: The mitral valve is normal in structure. No evidence of mitral valve stenosis by observation. Moderate mitral valve regurgitation.  Tricuspid Valve: The tricuspid valve is normal in structure. Tricuspid valve regurgitation is mild by color flow Doppler.  Aortic Valve: The aortic  valve is tricuspid. Aortic valve regurgitation was not visualized by color flow Doppler. Mild aortic valve sclerosis is present, with no evidence of aortic valve stenosis.  Pulmonic Valve: The pulmonic valve was normal in structure. Pulmonic valve regurgitation is trivial by color flow Doppler.  Aorta: The aortic root is normal in size and structure.  Venous: The inferior vena cava is normal in size with greater than 50% respiratory variability, suggesting right atrial pressure of 3 mmHg.      Patient Profile     73 y.o. female hx of with hx of CAD with coronary spasm, HTN, HLD,Grave's Dz and multinodular goiter(followed by Dr. Haynes KernsEllision), mitral regurgitation and chronic diastolic heart failure  Assessment & Plan  1.  Chest pain with hx of coronary vasospasm -- HS-troponin 54 x2, 61, 86, 77 --  EKG with slight more pronounced ST depression inferior later leads. Placed on nitro paste. no stopped due to hypotension --EF now decreased from 50-55% to 35-40% Mod MR, G2DD, severe LAE ? Due to illness Dr. Tresa EndoKelly to see. --cardiac cath 2018 with 25% ost 1 mrg , 20% LCx and 10 mLAD stenosis  2. Mitral regurgitation - mild to moderate by last echo 09/2017. Now moderate  3. Chronic diastolic CHF  - BNP 2030. Drop in EF  BNP now 203  -Given  IV lasix 40mg  x1 inER. no I&O   4. HTN - improved control 107/64 to 98/56 this AM 119/72  -meds held (amlodipine, Imdur, NTG paste) she is normally on inderal 60 BID   5.  COVID-19  exposure about 2 weeks - tested negative 7 days ago, no w+ and fever up to 102.4 yesterday  F/U Covid positive  6.  Thrombocytopenia today, may be her viral illness.  7.  AKI cr 2.65 today up from 1.52   8.  Hyperthyroid on tapazole   9.  HLD statin held and zetia continues       For questions or updates, please contact [facility] Please consult [email or web address] for contact info under      Signed, Nada BoozerLaura Ingold, NP  08/07/2019, 2:22 PM      Agree with assessment and plan.  Per discussion with patient.  Presently she feels significantly improved today.  She denies recurrent chest pain.  Her ECG from October 10 showed sinus rhythm at 95 with inferolateral ST depression worrisome for inferolateral ischemia.  ECG today shows significant improvement with normal sinus rhythm at 75 with marked improvement in her previous inferolateral ST T changes.  She is having isolated PVCs.  Her echo Doppler study shows reduced EF of 35 to 40% which is new from her prior echo of 2018.  She has developed acute kidney injury with baseline creatinine 1.22 on admission increasing to 2.65.  D-dimer was mildly positive.  C-reactive protein was increased this and with her increased inflammatory markers.  Platelet count today is decreased to 104 K from 180 4K.  BP earlier today was decreased for which amlodipine nitrates were held.  With her reduced LV function, recommend changing propranolol to metoprolol succinate at 25 mg and plan to titrate as blood pressure and pulse allow.  We will also reinstitute low-dose amlodipine at 2.5 mg.  We will need to monitor platelet function and renal function.  Troponin levels are increased in a flat plateau rather than acute coronary syndrome pattern.  Plan to monitor closely.  Will repeat ECG in a.m.   Lennette Biharihomas A. Kadeja Granada, MD, [facility] Surgery CenterFACC 08/07/2019 5:13 PM

## 2019-08-07 NOTE — Progress Notes (Signed)
PROGRESS NOTE    [name]  ZOX:[id number] DOB: Oct 21, 1946 DOA: 08/05/2019 PCP: Shelva Majestic, MD   Brief Narrative:  Patient is a 73 year old female with history of coronary artery disease, status post cardiac cath with nonobstructive lesions in 8/18,history of hypertension, hyperlipidemia who presents to the emergency department with complaints of tightness in the left chest radiating to the left arm, worsening shortness of breath.  She reported history of exposure to COVID about 2 weeks ago but tested negative for COVID about a week ago.  She complained of general weakness on presentation.  On presentation, her BNP was elevated 2000 and had mild elevated troponins.  EKG showed ST depression in the inferolateral leads.  Cardiology consulted.  Echo showed reduced EF. She is currently COVID symptom-free except for generalized weakness and is saturating fine on room air.Deveoped AKI today.   Assessment & Plan:   Principal Problem:   Chest pain Active Problems:   HTN (hypertension)   Coronary artery disease involving native coronary artery of native heart with angina pectoris (HCC)   Hyperthyroidism   Demand ischemia (HCC)   COVID-19 virus infection   COVID-19   Acute congestive heart failure: Presented with shortness of breath, elevated BNP on presentation.  No pneumonia or pleural effusion or pulmonary edema as per chest x-ray.  She was give a dose of lasix.  Echocardiogram showed ejection fraction of 35 to 40%, moderately decreased left ventricular function, pseudonormalization pattern of LV diastolic filling, severely dilated left atrium.Echo on 09/2017 had shown ejection fraction of 50-55%. Cardiology following.  COVID-19: Presented with shortness of breath but it could be from possible congestive heart failure.  Currently denies any dyspnea.  Saturating fine on room air.  Chest x-ray did not show any infiltrate.  Will monitor inflammatory markers.  Does not meet criteria for  steroids or remedesivir.  Continue vitamin C and zinc.Tmax of 101.1 today. CXR this morning showed clear lungs  AKI: Creatinine continues to go up.  Also became hypotensive this morning.  Started on gentle IV fluids.Her last creatinine was 1.42 on 9/20.  Atypical chest pain/suspected acute coronary syndrome: Currently chest pain-free.  Presented with shortness of breath, left chest pain with radiation to the left arm.  Has mild troponin elevation but the trend is flat.  Denied any chest pain this morning.  EKG showed ST depression in the inferolateral leads.  Heparin drip not started.  Cardiology following.  Echocardiogram as above  History of coronary artery disease:history of coronary artery disease, status post cardiac cath with nonobstructive lesions in 8/18.  Continue home medicines.  Left upper lobe nodule: Chest x-ray showed rounded 1.1 cm nodular density projecting within the left upperlobe. Recommend CT of the chest for further evaluation which will be done as an outpatient after dilatation of COVID.  She is a non-smoker.  Hyperthyroidism: History of Graves' disease/multinodular goiter.  Follows with endocrinology.  On methimazole and propanolol at home which we will continue.  Hypertension:Hypotensive this morning.  Antihypertensives held.  GERD: Continue Protonix  Thrombocytopenia: Most likely secondary to viral illness.  We will continue to monitor.           DVT prophylaxis:Lovenox Code Status: Full Family Communication: called daughter on phone on 08/06/19 Disposition Plan: Home after cardiology clearance   Consultants: Cardiology  Procedures:None  Antimicrobials:  Anti-infectives (From admission, onward)   None      Subjective:  Patient seen and examined the bedside this morning.  She was febrile, hypotensive this morning.  Also developed AKI so started on IV fluids.  On examination, she looked comfortable.  Denies any chest pain or shortness of breath.   Complains of generalized weakness.  Respiratory status stable, saturating fine on room air  Objective: Vitals:   08/07/19 0015 08/07/19 0209 08/07/19 0450 08/07/19 0918  BP: 107/64  (!) 98/56 119/72  Pulse: 69  71 63  Resp: 16  16 16   Temp: (!) 102.4 F (39.1 C) (!) 101.6 F (38.7 C) (!) 101.1 F (38.4 C) 98.9 F (37.2 C)  TempSrc: Oral Oral Oral Oral  SpO2: 97%  97% 95%  Weight:      Height:        Intake/Output Summary (Last 24 hours) at 08/07/2019 1320 Last data filed at 08/07/2019 1010 Gross per 24 hour  Intake 734.62 ml  Output 1 ml  Net 733.62 ml   Filed Weights   08/05/19 1306 08/06/19 2216  Weight: 61.2 kg 60.4 kg    Examination:  General exam:,Not in distress,generalised weakness HEENT:PERRL,Oral mucosa moist, Ear/Nose normal on gross exam Respiratory system: Bilateral equal air entry, normal vesicular breath sounds, no wheezes or crackles  Cardiovascular system: S1 & S2 heard, RRR. No JVD, murmurs, rubs, gallops or clicks. Gastrointestinal system: Abdomen is nondistended, soft and nontender. No organomegaly or masses felt. Normal bowel sounds heard. Central nervous system: Alert and oriented. No focal neurological deficits. Extremities: No edema, no clubbing ,no cyanosis, distal peripheral pulses palpable. Skin: No rashes, lesions or ulcers,no icterus ,no pallor    Data Reviewed: I have personally reviewed following labs and imaging studies  CBC: Recent Labs  Lab 08/05/19 1019 08/07/19 0529  WBC 3.8* 3.5*  NEUTROABS  --  2.5  HGB 14.9 13.2  HCT 45.6 39.0  MCV 96.0 95.6  PLT 184 104*   Basic Metabolic Panel: Recent Labs  Lab 08/05/19 1019 08/05/19 1114 08/06/19 0903 08/07/19 0529  NA 135  --  135 134*  K 3.7  --  3.2* 4.3  CL 101  --  100 103  CO2 21*  --  21* 19*  GLUCOSE 117*  --  168* 127*  BUN 12  --  34* 51*  CREATININE 1.22*  --  1.52* 2.65*  CALCIUM 9.3  --  9.0 8.5*  MG  --  1.9  --  2.0  PHOS  --  3.9  --   --     GFR: Estimated Creatinine Clearance: 18 mL/min (A) (by C-G formula based on SCr of 2.65 mg/dL (H)). Liver Function Tests: Recent Labs  Lab 08/05/19 1114 08/07/19 0529  AST 25 27  ALT 24 16  ALKPHOS 77 58  BILITOT 1.4* 0.9  PROT 8.2* 6.8  ALBUMIN 3.9 3.3*   Recent Labs  Lab 08/05/19 1114  LIPASE 25   No results for input(s): AMMONIA in the last 168 hours. Coagulation Profile: Recent Labs  Lab 08/05/19 1114  INR 1.2   Cardiac Enzymes: No results for input(s): CKTOTAL, CKMB, CKMBINDEX, TROPONINI in the last 168 hours. BNP (last 3 results) No results for input(s): PROBNP in the last 8760 hours. HbA1C: No results for input(s): HGBA1C in the last 72 hours. CBG: Recent Labs  Lab 08/05/19 1402  GLUCAP 100*   Lipid Profile: No results for input(s): CHOL, HDL, LDLCALC, TRIG, CHOLHDL, LDLDIRECT in the last 72 hours. Thyroid Function Tests: Recent Labs    08/05/19 1114  TSH 3.101  T3FREE 2.7   Anemia Panel: Recent Labs    08/07/19 0529  FERRITIN  332*   Sepsis Labs: Recent Labs  Lab 08/05/19 1114 08/07/19 0529 08/07/19 0816  LATICACIDVEN 1.8 1.2 0.9    Recent Results (from the past 240 hour(s))  SARS CORONAVIRUS 2 (TAT 6-24 HRS) Nasopharyngeal Nasopharyngeal Swab     Status: Abnormal   Collection Time: 08/05/19 12:59 PM   Specimen: Nasopharyngeal Swab  Result Value Ref Range Status   SARS Coronavirus 2 POSITIVE (A) NEGATIVE Final    Comment: RESULT CALLED TO, READ BACK BY AND VERIFIED WITH: B OSORIO,RN AT 2016 08/05/2019 BY L BENFIELD (NOTE) SARS-CoV-2 target nucleic acids are DETECTED. The SARS-CoV-2 RNA is generally detectable in upper and lower respiratory specimens during the acute phase of infection. Positive results are indicative of active infection with SARS-CoV-2. Clinical  correlation with patient history and other diagnostic information is necessary to determine patient infection status. Positive results do  not rule out bacterial  infection or co-infection with other viruses. The expected result is Negative. Fact Sheet for Patients: SugarRoll.be Fact Sheet for Healthcare Providers: https://www.woods-mathews.com/ This test is not yet approved or cleared by the [place] FDA and  has been authorized for detection and/or diagnosis of SARS-CoV-2 by FDA under an Emergency Use Authorization (EUA). This EUA will remain  in effect (meaning this test can be [place] ed) for the duration of the COVID-19 declaration under Section 564(b)(1) of the Act, 21 U.S.C. section 360bbb-3(b)(1), unless the authorization is terminated or revoked sooner. Performed at [facility], [place] [address], [place], [place] [address]   MRSA PCR Screening     Status: None   Collection Time: 08/06/19  1:04 PM   Specimen: Nasopharyngeal  Result Value Ref Range Status   MRSA by PCR NEGATIVE NEGATIVE Final    Comment:        The GeneXpert MRSA Assay (FDA approved for NASAL specimens only), is one component of a comprehensive MRSA colonization surveillance program. It is not intended to diagnose MRSA infection nor to guide or monitor treatment for MRSA infections. Performed at [facility], [place] [address] [place], [place] [address]          Radiology Studies: Dg Chest Port 1 View  Result Date: 08/07/2019 CLINICAL DATA:  Chest pain.  COVID-19 positive EXAM: PORTABLE CHEST 1 VIEW COMPARISON:  Two days ago FINDINGS: Normal heart size for portable technique. Minimal atelectasis at the right base. There is no edema, consolidation, effusion, or pneumothorax. Nodular density highlighted on prior is not clearly seen today. IMPRESSION: 1. No evidence of pneumonia. 2. Minimal atelectasis. Electronically Signed   By: Monte Fantasia M.D.   On: 08/07/2019 06:42        Scheduled Meds: . aspirin  325 mg Oral QODAY  . atorvastatin  20 mg Oral Daily  . [START ON 08/08/2019] enoxaparin (LOVENOX)  injection  30 mg Subcutaneous Daily  . ezetimibe  10 mg Oral Daily  . ferrous sulfate  325 mg Oral Q breakfast  . methimazole  40 mg Oral BID  . nitroGLYCERIN  1 inch Topical Q6H  . oxybutynin  10 mg Oral Daily  . pantoprazole  40 mg Oral Daily  . propranolol  60 mg Oral BID  . sodium bicarbonate  650 mg Oral BID  . sodium chloride flush  3 mL Intravenous Q12H  . venlafaxine XR  150 mg Oral Q breakfast  . vitamin C  500 mg Oral BID  . zinc sulfate  220 mg Oral Daily   Continuous Infusions: . sodium chloride    .  sodium chloride 100 mL/hr at 08/07/19 1010     LOS: 0 days    Time spent: 35 mins.More than 50% of that time was spent in counseling and/or coordination of care.      Burnadette PopAmrit Mahlet Jergens, MD [facility] Pager [phone]  If 7PM-7AM, please contact night-coverage [email or web address] Password [email or web address] 08/07/2019, 1:20 PM

## 2019-08-08 LAB — COMPREHENSIVE METABOLIC PANEL
ALT: 17 U/L (ref 0–44)
AST: 25 U/L (ref 15–41)
Albumin: 2.9 g/dL — ABNORMAL LOW (ref 3.5–5.0)
Alkaline Phosphatase: 48 U/L (ref 38–126)
Anion gap: 8 (ref 5–15)
BUN: 31 mg/dL — ABNORMAL HIGH (ref 8–23)
CO2: 22 mmol/L (ref 22–32)
Calcium: 8.2 mg/dL — ABNORMAL LOW (ref 8.9–10.3)
Chloride: 105 mmol/L (ref 98–111)
Creatinine, Ser: 1.17 mg/dL — ABNORMAL HIGH (ref 0.44–1.00)
GFR calc Af Amer: 54 mL/min — ABNORMAL LOW (ref >60)
GFR calc non Af Amer: 47 mL/min — ABNORMAL LOW (ref >60)
Glucose, Bld: 94 mg/dL (ref 70–99)
Potassium: 3.9 mmol/L (ref 3.5–5.1)
Sodium: 135 mmol/L (ref 135–145)
Total Bilirubin: 0.6 mg/dL (ref 0.3–1.2)
Total Protein: 6.4 g/dL — ABNORMAL LOW (ref 6.5–8.1)

## 2019-08-08 LAB — CBC WITH DIFFERENTIAL/PLATELET
Abs Immature Granulocytes: 0.01 10*3/uL (ref 0.00–0.07)
Basophils Absolute: 0 10*3/uL (ref 0.0–0.1)
Basophils Relative: 0 %
Eosinophils Absolute: 0 10*3/uL (ref 0.0–0.5)
Eosinophils Relative: 0 %
HCT: 38.6 % (ref 36.0–46.0)
Hemoglobin: 12.9 g/dL (ref 12.0–15.0)
Immature Granulocytes: 0 %
Lymphocytes Relative: 34 %
Lymphs Abs: 1.1 10*3/uL (ref 0.7–4.0)
MCH: 31.9 pg (ref 26.0–34.0)
MCHC: 33.4 g/dL (ref 30.0–36.0)
MCV: 95.3 fL (ref 80.0–100.0)
Monocytes Absolute: 0.3 10*3/uL (ref 0.1–1.0)
Monocytes Relative: 9 %
Neutro Abs: 1.9 10*3/uL (ref 1.7–7.7)
Neutrophils Relative %: 57 %
Platelets: 112 10*3/uL — ABNORMAL LOW (ref 150–400)
RBC: 4.05 MIL/uL (ref 3.87–5.11)
RDW: 14 % (ref 11.5–15.5)
WBC: 3.3 10*3/uL — ABNORMAL LOW (ref 4.0–10.5)
nRBC: 0 % (ref 0.0–0.2)

## 2019-08-08 LAB — LACTATE DEHYDROGENASE: LDH: 188 U/L (ref 98–192)

## 2019-08-08 LAB — MAGNESIUM: Magnesium: 1.9 mg/dL (ref 1.7–2.4)

## 2019-08-08 LAB — BRAIN NATRIURETIC PEPTIDE: B Natriuretic Peptide: 294.3 pg/mL — ABNORMAL HIGH (ref 0.0–100.0)

## 2019-08-08 LAB — D-DIMER, QUANTITATIVE: D-Dimer, Quant: 0.58 ug/mL-FEU — ABNORMAL HIGH (ref 0.00–0.50)

## 2019-08-08 LAB — FERRITIN: Ferritin: 587 ng/mL — ABNORMAL HIGH (ref 11–307)

## 2019-08-08 LAB — C-REACTIVE PROTEIN: CRP: 4.8 mg/dL — ABNORMAL HIGH (ref <1.0)

## 2019-08-08 MED ORDER — ENOXAPARIN SODIUM 40 MG/0.4ML ~~LOC~~ SOLN
40.0000 mg | Freq: Every day | SUBCUTANEOUS | Status: DC
  Administered 2019-08-08 – 2019-08-14 (×7): 40 mg via SUBCUTANEOUS
  Filled 2019-08-08 (×7): qty 0.4

## 2019-08-08 MED ORDER — AMLODIPINE BESYLATE 5 MG PO TABS: 5.0000 mg | ORAL_TABLET | Freq: Every day | ORAL | 0 refills | Status: DC

## 2019-08-08 MED ORDER — ZINC SULFATE 220 (50 ZN) MG PO CAPS: 220.0000 mg | ORAL_CAPSULE | Freq: Every day | ORAL | 0 refills | Status: AC

## 2019-08-08 MED ORDER — METOPROLOL SUCCINATE ER 25 MG PO TB24: 25.0000 mg | ORAL_TABLET | Freq: Every day | ORAL | 0 refills | Status: DC

## 2019-08-08 MED ORDER — ASCORBIC ACID 500 MG PO TABS: 500.0000 mg | ORAL_TABLET | Freq: Two times a day (BID) | ORAL | 0 refills | Status: AC

## 2019-08-08 MED ORDER — ASPIRIN EC 81 MG PO TBEC: 81.0000 mg | DELAYED_RELEASE_TABLET | Freq: Every day | ORAL | 1 refills | Status: AC

## 2019-08-08 NOTE — Discharge Summary (Addendum)
Physician Discharge Summary  [name] ZOX:[id number] DOB: 1945-12-22 DOA: 08/05/2019  PCP: Shelva Majestic, MD  Admit date: 08/05/2019 Discharge date: 08/08/2019  Admitted From: Home Disposition:  Home  Discharge Condition:Stable CODE STATUS:FULL, Diet recommendation: Heart Healthy  Brief/Interim Summary: Patient is a 73 year old female with history of coronary artery disease, status post cardiac cath with nonobstructive lesions in 8/18,history of hypertension, hyperlipidemia who presents to the emergency department with complaints of tightness in the left chest radiating to the left arm, worsening shortness of breath.  She reported history of exposure to COVID about 2 weeks ago but tested negative for COVID about a week ago.  She complained of general weakness on presentation.  On presentation, her BNP was elevated 2000 and had mild elevated troponins.  EKG showed ST depression in the inferolateral leads.  Cardiology consulted.  Echo showed reduced EF. She is currently COVID symptom-free except for generalized weakness and is saturating fine on room air. There is no plan for intervention by cardiology for now.  She will be followed by cardiology as an outpatient.  Medication adjusted.  Currently she is hemodynamically stable for discharge.  Respiratory status has been stable since admission.  She was seen by physical therapy and recommended home health on discharge.  Following problems were addressed during her hospitalization:  Acute congestive heart failure: Presented with shortness of breath, elevated BNP on presentation.  No pneumonia or pleural effusion or pulmonary edema as per chest x-ray.  She was give a dose of lasix.  Echocardiogram showed ejection fraction of 35 to 40%, moderately decreased left ventricular function, pseudonormalization pattern of LV diastolic filling, severely dilated left atrium.Echo on 09/2017 had shown ejection fraction of 50-55%. Cardiology following.   Currently on Toprol and amlodipine.  ACE inhibitor, Imdur discontinued due to of soft blood pressure.  COVID-19: Presented with shortness of breath but it could be from possible congestive heart failure.  Currently denies any dyspnea.  Saturating fine on room air.  Chest x-ray did not show any infiltrate.  Does not meet criteria for steroids or remedesivir.  Continue vitamin C and zinc.Afebrile. CXR  showed clear lungs  AKI: Resolved .Her last creatinine was 1.42 on 9/20.  Atypical chest pain/suspected acute coronary syndrome: Currently chest pain-free.  Presented with shortness of breath, left chest pain with radiation to the left arm.  Has mild troponin elevation but the trend is flat.  Denied any chest pain this morning.  Last EKG showed ST depression in the inferolateral leads.    Cardiology was following.  History of coronary artery disease:history of coronary artery disease, status post cardiac cath with nonobstructive lesions in 8/18.  Continue home medicines.ACE inhibitor and imdur held due to hypotension.  Left upper lobe nodule: Chest x-ray showed rounded 1.1 cm nodular density projecting within the left upperlobe. Recommend CT of the chest for further evaluation which will be done as an outpatient after resolution of COVID.  She is a non-smoker.  Hyperthyroidism: History of Graves' disease/multinodular goiter.  Follows with endocrinology.  On methimazole . propanolol changed to metoprolol.  Hypertension:BP soft. Continue current meds  GERD: Continue Protonix  Thrombocytopenia: Mild.Most likely secondary to viral illness.    Discharge Diagnoses:  Principal Problem:   Chest pain Active Problems:   HTN (hypertension)   Coronary artery disease involving native coronary artery of native heart with angina pectoris (HCC)   Hyperthyroidism   Demand ischemia (HCC)   COVID-19 virus infection   COVID-19    Discharge Instructions  Discharge  Instructions    Diet - low  sodium heart healthy   Complete by: As directed    Discharge instructions   Complete by: As directed    1)Please follow up with your PCP in a week.Do a BMP test during the follow up. 2)Follow up with your cardiologist in 2 weeks. 3)Take prescribed medications as instructed. 5)Do a Chest CT with contrast as an outpatient as per the referral of your PCP. 5)Infection Prevention Recommendations for Individuals Confirmed to have, or Being Evaluated for, 2019 Novel Coronavirus (COVID-19) Infection Who Receive Care at Home  Individuals who are confirmed to have, or are being evaluated for, COVID-19 should follow the prevention steps below until a healthcare provider or local or state health department says they can return to normal activities.  Stay home except to get medical care You should restrict activities outside your home, except for getting medical care. Do not go to work, school, or public areas, and do not use public transportation or taxis.  Call ahead before visiting your doctor Before your medical appointment, call the healthcare provider and tell them that you have, or are being evaluated for, COVID-19 infection. This will help the healthcare provider's office take steps to keep other people from getting infected. Ask your healthcare provider to call the local or state health department.  Monitor your symptoms Seek prompt medical attention if your illness is worsening (e.g., difficulty breathing). Before going to your medical appointment, call the healthcare provider and tell them that you have, or are being evaluated for, COVID-19 infection. Ask your healthcare provider to call the local or state health department.  Wear a facemask You should wear a facemask that covers your nose and mouth when you are in the same room with other people and when you visit a healthcare provider. People who live with or visit you should also wear a facemask while they are in the same room  with you.  Separate yourself from other people in your home As much as possible, you should stay in a different room from other people in your home. Also, you should use a separate bathroom, if available.  Avoid sharing household items You should not share dishes, drinking glasses, cups, eating utensils, towels, bedding, or other items with other people in your home. After using these items, you should wash them thoroughly with soap and water.  Cover your coughs and sneezes Cover your mouth and nose with a tissue when you cough or sneeze, or you can cough or sneeze into your sleeve. Throw used tissues in a lined trash can, and immediately wash your hands with soap and water for at least 20 seconds or use an alcohol-based hand rub.  Wash your [employer] Corporationhands Wash your hands often and thoroughly with soap and water for at least 20 seconds. You can use an alcohol-based hand sanitizer if soap and water are not available and if your hands are not visibly dirty. Avoid touching your eyes, nose, and mouth with unwashed hands.   Prevention Steps for Caregivers and Household Members of Individuals Confirmed to have, or Being Evaluated for, COVID-19 Infection Being Cared for in the Home  If you live with, or provide care at home for, a person confirmed to have, or being evaluated for, COVID-19 infection please follow these guidelines to prevent infection:  Follow healthcare provider's instructions Make sure that you understand and can help the patient follow any healthcare provider instructions for all care.  Provide for the patient's basic needs You should  help the patient with basic needs in the home and provide support for getting groceries, prescriptions, and other personal needs.  Monitor the patient's symptoms If they are getting sicker, call his or her medical provider and tell them that the patient has, or is being evaluated for, COVID-19 infection. This will help the healthcare  provider's office take steps to keep other people from getting infected. Ask the healthcare provider to call the local or state health department.  Limit the number of people who have contact with the patient If possible, have only one caregiver for the patient. Other household members should stay in another home or place of residence. If this is not possible, they should stay in another room, or be separated from the patient as much as possible. Use a separate bathroom, if available. Restrict visitors who do not have an essential need to be in the home.  Keep older adults, very young children, and other sick people away from the patient Keep older adults, very young children, and those who have compromised immune systems or chronic health conditions away from the patient. This includes people with chronic heart, lung, or kidney conditions, diabetes, and cancer.  Ensure good ventilation Make sure that shared spaces in the home have good air flow, such as from an air conditioner or an opened window, weather permitting.  Wash your hands often Wash your hands often and thoroughly with soap and water for at least 20 seconds. You can use an alcohol based hand sanitizer if soap and water are not available and if your hands are not visibly dirty. Avoid touching your eyes, nose, and mouth with unwashed hands. Use disposable paper towels to dry your hands. If not available, use dedicated cloth towels and replace them when they become wet.  Wear a facemask and gloves Wear a disposable facemask at all times in the room and gloves when you touch or have contact with the patient's blood, body fluids, and/or secretions or excretions, such as sweat, saliva, sputum, nasal mucus, vomit, urine, or feces. Ensure the mask fits over your nose and mouth tightly, and do not touch it during use. Throw out disposable facemasks and gloves after using them. Do not reuse. Wash your hands immediately after removing  your facemask and gloves. If your personal clothing becomes contaminated, carefully remove clothing and launder. Wash your hands after handling contaminated clothing. Place all used disposable facemasks, gloves, and other waste in a lined container before disposing them with other household waste. Remove gloves and wash your hands immediately after handling these items.  Do not share dishes, glasses, or other household items with the patient Avoid sharing household items. You should not share dishes, drinking glasses, cups, eating utensils, towels, bedding, or other items with a patient who is confirmed to have, or being evaluated for, COVID-19 infection. After the person uses these items, you should wash them thoroughly with soap and water.  Wash laundry thoroughly Immediately remove and wash clothes or bedding that have blood, body fluids, and/or secretions or excretions, such as sweat, saliva, sputum, nasal mucus, vomit, urine, or feces, on them. Wear gloves when handling laundry from the patient. Read and follow directions on labels of laundry or clothing items and detergent. In general, wash and dry with the warmest temperatures recommended on the label.  Clean all areas the individual has used often Clean all touchable surfaces, such as counters, tabletops, doorknobs, bathroom fixtures, toilets, phones, keyboards, tablets, and bedside tables, every day. Also, clean any surfaces  that may have blood, body fluids, and/or secretions or excretions on them. Wear gloves when cleaning surfaces the patient has come in contact with. Use a diluted bleach solution (e.g., dilute bleach with 1 part bleach and 10 parts water) or a household disinfectant with a label that says EPA-registered for coronaviruses. To make a bleach solution at home, add 1 tablespoon of bleach to 1 quart (4 cups) of water. For a larger supply, add  cup of bleach to 1 gallon (16 cups) of water. Read labels of cleaning products  and follow recommendations provided on product labels. Labels contain instructions for safe and effective use of the cleaning product including precautions you should take when applying the product, such as wearing gloves or eye protection and making sure you have good ventilation during use of the product. Remove gloves and wash hands immediately after cleaning.  Monitor yourself for signs and symptoms of illness Caregivers and household members are considered close contacts, should monitor their health, and will be asked to limit movement outside of the home to the extent possible. Follow the monitoring steps for close contacts listed on the symptom monitoring form.    Increase activity slowly   Complete by: As directed    MyChart COVID-19 home monitoring program   Complete by: Aug 08, 2019    Is the patient willing to use the MyChart Mobile App for home monitoring?: Yes     Allergies as of 08/08/2019      Reactions   Sulfa Drugs Cross Reactors Shortness Of Breath, Palpitations      Medication List    STOP taking these medications   isosorbide mononitrate 60 MG 24 hr tablet Commonly known as: IMDUR   lisinopril 20 MG tablet Commonly known as: ZESTRIL   propranolol 60 MG tablet Commonly known as: INDERAL     TAKE these medications   amLODipine 5 MG tablet Commonly known as: NORVASC Take 1 tablet (5 mg total) by mouth daily. Start taking on: August 09, 2019 What changed:   medication strength  how much to take   ascorbic acid 500 MG tablet Commonly known as: VITAMIN C Take 1 tablet (500 mg total) by mouth 2 (two) times daily.   aspirin EC 81 MG tablet Take 1 tablet (81 mg total) by mouth daily. What changed:   medication strength  how much to take  when to take this   atorvastatin 20 MG tablet Commonly known as: LIPITOR TAKE 1 TABLET BY MOUTH EVERY DAY What changed:   when to take this  additional instructions   cholecalciferol 10 MCG (400 UNIT)  Tabs tablet Commonly known as: VITAMIN D3 Take 400 Units daily by mouth.   esomeprazole 40 MG capsule Commonly known as: NexIUM Take 1 capsule (40 mg total) by mouth daily.   ezetimibe 10 MG tablet Commonly known as: ZETIA Take 1 tablet (10 mg total) by mouth daily.   Ferrous Sulfate Dried 45 MG Tbcr Commonly known as: EQ Slow-Release Iron Take 1 tablet by mouth daily.   loperamide 2 MG capsule Commonly known as: IMODIUM Take 1 capsule (2 mg total) by mouth as needed for diarrhea or loose stools.   methimazole 10 MG tablet Commonly known as: TAPAZOLE TAKE 4 TABLETS BY MOUTH TWICE DAILY What changed:   how much to take  how to take this  when to take this   metoprolol succinate 25 MG 24 hr tablet Commonly known as: TOPROL-XL Take 1 tablet (25 mg total) by mouth daily.  Start taking on: August 09, 2019   nitroGLYCERIN 0.4 MG SL tablet Commonly known as: NITROSTAT PLACE 1 TABLET UNDER THE TONGUE EVERY 5 MINUTES FOR CHEST PAIN FOR 3 DOSES   oxybutynin 10 MG 24 hr tablet Commonly known as: DITROPAN-XL TAKE 1 TABLET BY MOUTH ONCE DAILY What changed: when to take this   potassium chloride 10 MEQ CR capsule Commonly known as: MICRO-K TAKE 1 CAPSULE BY MOUTH TWICE DAILY   traMADol 50 MG tablet Commonly known as: ULTRAM Take 1-2 tablets (50-100 mg total) by mouth every 6 (six) hours as needed for moderate pain.   venlafaxine XR 150 MG 24 hr capsule Commonly known as: EFFEXOR-XR TAKE 1 CAPSULE BY MOUTH EVERY NIGHT AT BEDTIME - office visit before next refill What changed:   how much to take  how to take this  when to take this   Vitamin B Complex Tabs Take 1 tablet by mouth daily.   zinc sulfate 220 (50 Zn) MG capsule Take 1 capsule (220 mg total) by mouth daily. Start taking on: August 09, 2019      Follow-up Information    Shelva Majestic, MD. Schedule an appointment as soon as possible for a visit in 1 week(s).   Specialty: Family  Medicine Contact information: [address] [place] [address] [phone]        Lyn Records, MD. Schedule an appointment as soon as possible for a visit in 2 week(s).   Specialty: Cardiology Contact information: [address] [place] [address] [phone]          Allergies  Allergen Reactions  . Sulfa Drugs Cross Reactors Shortness Of Breath and Palpitations    Consultations:  Cardiology   Procedures/Studies: Dg Chest 2 View  Result Date: 08/05/2019 CLINICAL DATA:  Chest pain, back pain EXAM: CHEST - 2 VIEW COMPARISON:  09/29/2017 FINDINGS: Heart size is upper limits of normal, stable. Calcific aortic knob. Rounded 11 mm nodular density projects within the left upper lobe. Right lung appears clear. No pleural effusion. No pneumothorax. IMPRESSION: Rounded 1.1 cm nodular density projecting within the left upper lobe. Recommend CT of the chest for further evaluation. Electronically Signed   By: Duanne Guess M.D.   On: 08/05/2019 11:07   Dg Chest Port 1 View  Result Date: 08/07/2019 CLINICAL DATA:  Chest pain.  COVID-19 positive EXAM: PORTABLE CHEST 1 VIEW COMPARISON:  Two days ago FINDINGS: Normal heart size for portable technique. Minimal atelectasis at the right base. There is no edema, consolidation, effusion, or pneumothorax. Nodular density highlighted on prior is not clearly seen today. IMPRESSION: 1. No evidence of pneumonia. 2. Minimal atelectasis. Electronically Signed   By: Marnee Spring M.D.   On: 08/07/2019 06:42      Subjective:  Patient seen and examined the bedside this morning.  Hemodynamically stable for discharge.  I called the daughter and updated the plan  Discharge Exam: Vitals:   08/08/19 0621 08/08/19 0900  BP: 140/65 126/63  Pulse: 68 62  Resp: 17 17  Temp: 99.8 F (37.7 C) 98.7 F (37.1 C)  SpO2: 97% 100%   Vitals:   08/07/19 1940 08/07/19 2345 08/08/19 0621 08/08/19 0900  BP: 118/66  129/71 140/65 126/63  Pulse: 63 (!) 59 68 62  Resp: Temp: 100.3 F (37.9 C) 99 F (37.2 C) 99.8 F (37.7 C) 98.7 F (37.1 C)  TempSrc: Oral Oral Oral Oral  SpO2: 93% 100% 97%  100%  Weight:      Height:        General: Pt is alert, awake, not in acute distress Cardiovascular: RRR, S1/S2 +, no rubs, no gallops Respiratory: CTA bilaterally, no wheezing, no rhonchi Abdominal: Soft, NT, ND, bowel sounds + Extremities: no edema, no cyanosis    The results of significant diagnostics from this hospitalization (including imaging, microbiology, ancillary and laboratory) are listed below for reference.     Microbiology: Recent Results (from the past 240 hour(s))  SARS CORONAVIRUS 2 (TAT 6-24 HRS) Nasopharyngeal Nasopharyngeal Swab     Status: Abnormal   Collection Time: 08/05/19 12:59 PM   Specimen: Nasopharyngeal Swab  Result Value Ref Range Status   SARS Coronavirus 2 POSITIVE (A) NEGATIVE Final    Comment: RESULT CALLED TO, READ BACK BY AND VERIFIED WITH: B OSORIO,RN AT 2016 08/05/2019 BY L BENFIELD (NOTE) SARS-CoV-2 target nucleic acids are DETECTED. The SARS-CoV-2 RNA is generally detectable in upper and lower respiratory specimens during the acute phase of infection. Positive results are indicative of active infection with SARS-CoV-2. Clinical  correlation with patient history and other diagnostic information is necessary to determine patient infection status. Positive results do  not rule out bacterial infection or co-infection with other viruses. The expected result is Negative. Fact Sheet for Patients: HairSlick.no Fact Sheet for Healthcare Providers: quierodirigir.com This test is not yet approved or cleared by the [place] FDA and  has been authorized for detection and/or diagnosis of SARS-CoV-2 by FDA under an Emergency Use Authorization (EUA). This EUA will remain  in effect (meaning this test  can be [place] ed) for the duration of the COVID-19 declaration under Section 564(b)(1) of the Act, 21 U.S.C. section 360bbb-3(b)(1), unless the authorization is terminated or revoked sooner. Performed at [facility], [address], [place], [place] [address]   MRSA PCR Screening     Status: None   Collection Time: 08/06/19  1:04 PM   Specimen: Nasopharyngeal  Result Value Ref Range Status   MRSA by PCR NEGATIVE NEGATIVE Final    Comment:        The GeneXpert MRSA Assay (FDA approved for NASAL specimens only), is one component of a comprehensive MRSA colonization surveillance program. It is not intended to diagnose MRSA infection nor to guide or monitor treatment for MRSA infections. Performed at [facility], [address], [place], [place] [address]      Labs: BNP (last 3 results) Recent Labs    08/05/19 1114 08/07/19 0529 08/08/19 0442  BNP 2,030.7* 203.3* 294.3*   Basic Metabolic Panel: Recent Labs  Lab 08/05/19 1019 08/05/19 1114 08/06/19 0903 08/07/19 0529 08/08/19 0442  NA 135  --  135 134* 135  K 3.7  --  3.2* 4.3 3.9  CL 101  --  100 103 105  CO2 21*  --  21* 19* 22  GLUCOSE 117*  --  168* 127* 94  BUN 12  --  34* 51* 31*  CREATININE 1.22*  --  1.52* 2.65* 1.17*  CALCIUM 9.3  --  9.0 8.5* 8.2*  MG  --  1.9  --  2.0 1.9  PHOS  --  3.9  --   --   --    Liver Function Tests: Recent Labs  Lab 08/05/19 1114 08/07/19 0529 08/08/19 0442  AST 25 27 25   ALT 24 16 17   ALKPHOS 77 58 48  BILITOT 1.4* 0.9 0.6  PROT 8.2* 6.8 6.4*  ALBUMIN 3.9  3.3* 2.9*   Recent Labs  Lab 08/05/19 1114  LIPASE 25   No results for input(s): AMMONIA in the last 168 hours. CBC: Recent Labs  Lab 08/05/19 1019 08/07/19 0529 08/08/19 0442  WBC 3.8* 3.5* 3.3*  NEUTROABS  --  2.5 1.9  HGB 14.9 13.2 12.9  HCT 45.6 39.0 38.6  MCV 96.0 95.6 95.3  PLT 184 104* 112*   Cardiac Enzymes: No results for input(s): CKTOTAL, CKMB, CKMBINDEX, TROPONINI in the  last 168 hours. BNP: Invalid input(s): POCBNP CBG: Recent Labs  Lab 08/05/19 1402  GLUCAP 100*   D-Dimer Recent Labs    08/07/19 0529 08/08/19 0442  DDIMER 0.66* 0.58*   Hgb A1c No results for input(s): HGBA1C in the last 72 hours. Lipid Profile No results for input(s): CHOL, HDL, LDLCALC, TRIG, CHOLHDL, LDLDIRECT in the last 72 hours. Thyroid function studies No results for input(s): TSH, T4TOTAL, T3FREE, THYROIDAB in the last 72 hours.  Invalid input(s): FREET3 Anemia work up Recent Labs    08/07/19 0529 08/08/19 0442  FERRITIN 332* 587*   Urinalysis    Component Value Date/Time   COLORURINE YELLOW 08/05/2019 1456   APPEARANCEUR CLEAR 08/05/2019 1456   LABSPEC 1.009 08/05/2019 1456   PHURINE 5.0 08/05/2019 1456   GLUCOSEU NEGATIVE 08/05/2019 1456   HGBUR SMALL (A) 08/05/2019 1456   BILIRUBINUR NEGATIVE 08/05/2019 1456   BILIRUBINUR Negative 01/20/2018 1214   KETONESUR NEGATIVE 08/05/2019 1456   PROTEINUR 100 (A) 08/05/2019 1456   UROBILINOGEN 1.0 01/20/2018 1214   UROBILINOGEN 1.0 05/17/2014 0342   NITRITE NEGATIVE 08/05/2019 1456   LEUKOCYTESUR NEGATIVE 08/05/2019 1456   Sepsis Labs Invalid input(s): PROCALCITONIN,  WBC,  LACTICIDVEN Microbiology Recent Results (from the past 240 hour(s))  SARS CORONAVIRUS 2 (TAT 6-24 HRS) Nasopharyngeal Nasopharyngeal Swab     Status: Abnormal   Collection Time: 08/05/19 12:59 PM   Specimen: Nasopharyngeal Swab  Result Value Ref Range Status   SARS Coronavirus 2 POSITIVE (A) NEGATIVE Final    Comment: RESULT CALLED TO, READ BACK BY AND VERIFIED WITH: B OSORIO,RN AT 2016 08/05/2019 BY L BENFIELD (NOTE) SARS-CoV-2 target nucleic acids are DETECTED. The SARS-CoV-2 RNA is generally detectable in upper and lower respiratory specimens during the acute phase of infection. Positive results are indicative of active infection with SARS-CoV-2. Clinical  correlation with patient history and other diagnostic information  is necessary to determine patient infection status. Positive results do  not rule out bacterial infection or co-infection with other viruses. The expected result is Negative. Fact Sheet for Patients: HairSlick.no Fact Sheet for Healthcare Providers: quierodirigir.com This test is not yet approved or cleared by the [place] FDA and  has been authorized for detection and/or diagnosis of SARS-CoV-2 by FDA under an Emergency Use Authorization (EUA). This EUA will remain  in effect (meaning this test can be [place] ed) for the duration of the COVID-19 declaration under Section 564(b)(1) of the Act, 21 U.S.C. section 360bbb-3(b)(1), unless the authorization is terminated or revoked sooner. Performed at [facility], [address], [place], [place] [address]   MRSA PCR Screening     Status: None   Collection Time: 08/06/19  1:04 PM   Specimen: Nasopharyngeal  Result Value Ref Range Status   MRSA by PCR NEGATIVE NEGATIVE Final    Comment:        The GeneXpert MRSA Assay (FDA approved for NASAL specimens only), is one component of a comprehensive MRSA colonization surveillance program. It is not intended to  diagnose MRSA infection nor to guide or monitor treatment for MRSA infections. Performed at [facility], [place] [address], [place], [place] [address]     Please note: You were cared for by a hospitalist during your hospital stay. Once you are discharged, your primary care physician will handle any further medical issues. Please note that NO REFILLS for any discharge medications will be authorized once you are discharged, as it is imperative that you return to your primary care physician (or establish a relationship with a primary care physician if you do not have one) for your post hospital discharge needs so that they can reassess your need for medications and monitor your lab values.    Time coordinating  discharge: 40 minutes  SIGNED:   Shelly Coss, MD  [facility] 08/08/2019, 5:56 PM Pager [phone]  If 7PM-7AM, please contact night-coverage [email or web address] Password [email or web address]

## 2019-08-08 NOTE — Progress Notes (Signed)
Spoke with MD regarding discharge. Agrees with discharge home. Patient lives home alone and doesn't have 24/7 supervision at home. Patient didn't work with Physical Therapy due to Nausea. RN has ambulated patient to restroom 3x and stated patient did ok wobbly when standing but once she had her walker she was fine.  Case Manager spoke with patient and the patient doesn't want to discharge anywhere but home.   Family upset and doesn't feel the patient is safe to go home alone. Discussed disparities of the African American community and poor outcomes. Daughter and Son in law are [employer] and discussed their concerns with care. I spoke with daughter, son in law and son regarding plans of appealing discharge. Patient is alert and oriented and stated her 72 year old nephew could assist her if needed. Son was at the desk with no mask staff concerned regarding safety.

## 2019-08-08 NOTE — Progress Notes (Signed)
Spoke with patient regarding discharge. She stated she does not feel ready to go and would like to appeal. Patient stated if being told she had to leave or go to skilled nursing she would just go home. I gave her the medicare appeal paperwork and made daughter Stanton Kidney aware of the number that needs to be called to start the process.Messaged physician regarding decision.

## 2019-08-08 NOTE — Care Management Important Message (Signed)
Important Message  Patient Details  Name: [name] MRN: [id number] Date of Birth: [birth year]   Medicare Important Message Given:  Yes  CM reviewed IM with pt via phone, pt denied questions and concerns.  CM  printed IM to unit secretary desk and requested unit AD to hand deliver IM to pt at bedside.     Maryclare Labrador, RN 08/08/2019, 6:50 PM

## 2019-08-08 NOTE — Progress Notes (Signed)
Progress Note  Patient Name: [name] Date of Encounter: 08/08/2019  Primary Cardiologist: Lesleigh Noe, MD   Subjective   Feels much better today. M<ore energy  Inpatient Medications    Scheduled Meds: . amLODipine  2.5 mg Oral Daily  . aspirin  325 mg Oral QODAY  . atorvastatin  20 mg Oral Daily  . enoxaparin (LOVENOX) injection  40 mg Subcutaneous Daily  . ezetimibe  10 mg Oral Daily  . ferrous sulfate  325 mg Oral Q breakfast  . methimazole  40 mg Oral BID  . metoprolol succinate  25 mg Oral Daily  . nitroGLYCERIN  1 inch Topical Q6H  . oxybutynin  10 mg Oral Daily  . pantoprazole  40 mg Oral Daily  . sodium chloride flush  3 mL Intravenous Q12H  . venlafaxine XR  150 mg Oral Q breakfast  . vitamin C  500 mg Oral BID  . zinc sulfate  220 mg Oral Daily   Continuous Infusions: . sodium chloride     PRN Meds: sodium chloride, acetaminophen **OR** acetaminophen, HYDROcodone-acetaminophen, loperamide, nitroGLYCERIN, ondansetron **OR** ondansetron (ZOFRAN) IV, sodium chloride flush   Vital Signs    Vitals:   08/07/19 1940 08/07/19 2345 08/08/19 0621 08/08/19 0900  BP: 118/66 129/71 140/65 126/63  Pulse: 63 (!) 59 68 62  Resp: 20 20 17 17   Temp: 100.3 F (37.9 C) 99 F (37.2 C) 99.8 F (37.7 C) 98.7 F (37.1 C)  TempSrc: Oral Oral Oral Oral  SpO2: 93% 100% 97% 100%  Weight:      Height:        Intake/Output Summary (Last 24 hours) at 08/08/2019 1605 Last data filed at 08/08/2019 1100 Gross per 24 hour  Intake 2037.81 ml  Output -  Net 2037.81 ml   Last 3 Weights 08/06/2019 08/05/2019 12/26/2018  Weight (lbs) 133 lb 2.5 oz 135 lb 137 lb  Weight (kg) 60.4 kg 61.236 kg 62.143 kg      Telemetry    Sinus rhythm - Personally Reviewed  ECG    ECG (independently read by me): NSR at 67; isolated PVC; continued improvement in previous inferolateral ST changes from 08/05/2019  Physical Exam  BP 126/63 (BP Location: Right Arm)   Pulse 62   Temp  98.7 F (37.1 C) (Oral)   Resp 17   Ht 5\' 6"  (1.676 m)   Wt 60.4 kg   SpO2 100%   BMI 21.49 kg/m  No acute distress No JVD No wheezing  RRR with rare ectopy on ECG nontender abd No edema per patioent  Labs    High Sensitivity Troponin:   Recent Labs  Lab 08/05/19 1019 08/05/19 1223 08/06/19 0615 08/06/19 2003 08/06/19 2157  TROPONINIHS 54* 54* 61* 86* 77*      Chemistry Recent Labs  Lab 08/05/19 1114 08/06/19 0903 08/07/19 0529 08/08/19 0442  NA  --  135 134* 135  K  --  3.2* 4.3 3.9  CL  --  100 103 105  CO2  --  21* 19* 22  GLUCOSE  --  168* 127* 94  BUN  --  34* 51* 31*  CREATININE  --  1.52* 2.65* 1.17*  CALCIUM  --  9.0 8.5* 8.2*  PROT 8.2*  --  6.8 6.4*  ALBUMIN 3.9  --  3.3* 2.9*  AST 25  --  27 25  ALT 24  --  16 17  ALKPHOS 77  --  58 48  BILITOT 1.4*  --  0.9 0.6  GFRNONAA  --  34* 17* 47*  GFRAA  --  39* 20* 54*  ANIONGAP  --  14 12 8      Hematology Recent Labs  Lab 08/05/19 1019 08/07/19 0529 08/08/19 0442  WBC 3.8* 3.5* 3.3*  RBC 4.75 4.08 4.05  HGB 14.9 13.2 12.9  HCT 45.6 39.0 38.6  MCV 96.0 95.6 95.3  MCH 31.4 32.4 31.9  MCHC 32.7 33.8 33.4  RDW 14.0 13.9 14.0  PLT 184 104* 112*    BNP Recent Labs  Lab 08/05/19 1114 08/07/19 0529 08/08/19 0442  BNP 2,030.7* 203.3* 294.3*     DDimer  Recent Labs  Lab 08/07/19 0529 08/08/19 0442  DDIMER 0.66* 0.58*     Radiology    Dg Chest Port 1 View  Result Date: 08/07/2019 CLINICAL DATA:  Chest pain.  COVID-19 positive EXAM: PORTABLE CHEST 1 VIEW COMPARISON:  Two days ago FINDINGS: Normal heart size for portable technique. Minimal atelectasis at the right base. There is no edema, consolidation, effusion, or pneumothorax. Nodular density highlighted on prior is not clearly seen today. IMPRESSION: 1. No evidence of pneumonia. 2. Minimal atelectasis. Electronically Signed   By: Marnee SpringJonathon  Watts M.D.   On: 08/07/2019 06:42    Cardiac Studies   Echo 08/07/19   IMPRESSIONS    1. Left ventricular ejection fraction, by visual estimation, is 35 to 40%. The left ventricle has moderately decreased function. Normal left ventricular size. There is mildly increased left ventricular hypertrophy.  2. Elevated mean left atrial pressure.  3. Left ventricular diastolic Doppler parameters are consistent with pseudonormalization pattern of LV diastolic filling.  4. Global right ventricle has normal systolic function.The right ventricular size is normal.  5. Left atrial size was severely dilated.  6. Right atrial size was normal.  7. The mitral valve is normal in structure. Moderate mitral valve regurgitation. No evidence of mitral stenosis.  8. The tricuspid valve is normal in structure. Tricuspid valve regurgitation is mild.  9. The aortic valve is tricuspid Aortic valve regurgitation was not visualized by color flow Doppler. Mild aortic valve sclerosis without stenosis. 10. The pulmonic valve was normal in structure. Pulmonic valve regurgitation is trivial by color flow Doppler. 11. The inferior vena cava is normal in size with greater than 50% respiratory variability, suggesting right atrial pressure of 3 mmHg. 12. Moderate global reduction in LV systolic function; mild LVH; grade 2 diastolic dysfunction; moderate MR; severe LAE.  FINDINGS  Left Ventricle: Left ventricular ejection fraction, by visual estimation, is 35 to 40%. The left ventricle has moderately decreased function. There is mildly increased left ventricular hypertrophy. Normal left ventricular size. Spectral Doppler shows  Left ventricular diastolic Doppler parameters are consistent with pseudonormalization pattern of LV diastolic filling. Elevated mean left atrial pressure.  Right Ventricle: The right ventricular size is normal. Global RV systolic function is has normal systolic function. The tricuspid regurgitant velocity is 2.38 m/s, and with an assumed right atrial pressure of 3 mmHg, the  estimated right ventricular  systolic pressure is normal at 25.7 mmHg.  Left Atrium: Left atrial size was severely dilated.  Right Atrium: Right atrial size was normal in size  Pericardium: There is no evidence of pericardial effusion.  Mitral Valve: The mitral valve is normal in structure. No evidence of mitral valve stenosis by observation. Moderate mitral valve regurgitation.  Tricuspid Valve: The tricuspid valve is normal in structure. Tricuspid valve regurgitation is mild by color flow Doppler.  Aortic Valve: The aortic  valve is tricuspid. Aortic valve regurgitation was not visualized by color flow Doppler. Mild aortic valve sclerosis is present, with no evidence of aortic valve stenosis.  Pulmonic Valve: The pulmonic valve was normal in structure. Pulmonic valve regurgitation is trivial by color flow Doppler.  Aorta: The aortic root is normal in size and structure.  Venous: The inferior vena cava is normal in size with greater than 50% respiratory variability, suggesting right atrial pressure of 3 mmHg.      Patient Profile     73 y.o. female hx of with hx of CAD with coronary spasm, HTN, HLD,Grave's Dz and multinodular goiter(followed by Dr. Hale Bogus), mitral regurgitation and chronic diastolic heart failure  Assessment & Plan  1.  Chest pain with hx of coronary vasospasm -- HS-troponin 54 x2, 61, 86, 77; flat plateau c/w demand ischemia rather than ACS. --  EKG 10/10 with  pronounced ST depression inferior later leads. Placed on nitro paste.  stopped due to hypotension --EF now decreased from 50-55% to 35-40% Mod MR, G2DD, severe LAE  --cardiac cath 2018 with 25% ost 1 mrg , 20% LCx and 10 mLAD stenosis No recurrent chest pain. ECG today continues to improve. Amlodipine started yesterday; will increase to 5 mg daily;  can change to ASA 81 mg.   2. Mitral regurgitation - mild to moderate by last echo 09/2017. Now moderate  3. Chronic diastolic CHF: Grade 2  by echo - BNP 2030. Drop in EF  BNP now 203   4. HTN Improved today with changing propanolol to metoprolol succinate with decreased EF (alternatively could use coreg), and addition of amlodipine; will titrate to 5 mg.  5. COVID-19  exposure about 2 weeks - tested negative 7 days ago, no w+ and fever up to 102.4 F/U Covid positive  6.  Thrombocytopenia 184 > 104 > 112 today; on lovenox  7.  AKI : improved with hydration 2.65 > 1.17  8.  Hyperthyroid on tapazole   9.  HLD statin held and zetia continues     Plan F/U with Dr. Linard Millers for cardiology post DC.  For questions or updates, please contact [place] Please consult [email or web address] for contact info under      Signed, Shelva Majestic, MD  08/08/2019, 4:05 PM

## 2019-08-08 NOTE — Evaluation (Signed)
Physical Therapy Evaluation Patient Details Name: [name] MRN: [id number] DOB: [birth year] Today's Date: 08/08/2019   History of Present Illness  Patient is a 73 year old female with history of coronary artery disease, status post cardiac cath with nonobstructive lesions in 8/18,history of hypertension, hyperlipidemia admitted with chest pain positive eleveated BNP and reduced EF.  Work up with serial HS troponins thought not ACS.  Found to be covid positive.  Clinical Impression  Patient presents with decreased mobility due to generalized weakness, reports nursing staff has been assisting her to the bathroom.  Unable to participate in OOB activity this session due to nausea, but feel she will progress to be able to go home as she reports her grandson can stay with her.  Recommend follow up HHPT at d/c.  PT to follow acutely.     Follow Up Recommendations Home health PT;Supervision/Assistance - 24 hour    Equipment Recommendations  None recommended by PT    Recommendations for Other Services       Precautions / Restrictions Restrictions Weight Bearing Restrictions: No      Mobility  Bed Mobility Overal bed mobility: Modified Independent             General bed mobility comments: supine to sit and sit to supine  Transfers                 General transfer comment: NT due to pt with nausea  Ambulation/Gait                Stairs            Wheelchair Mobility    Modified Rankin (Stroke Patients Only)       Balance Overall balance assessment: Needs assistance   Sitting balance-Leahy Scale: Good Sitting balance - Comments: sat EOB about 10 minutes trying to help nausea eating crackers and drinking ginger ale                                     Pertinent Vitals/Pain Pain Assessment: No/denies pain    Home Living Family/patient expects to be discharged to:: Private residence Living Arrangements: Alone Available Help at  Discharge: Family;Available 24 hours/day(grandsons) Type of Home: House Home Access: Stairs to enter   [employer] of Steps: 1   Home Equipment: Environmental consultant - 2 wheels      Prior Function Level of Independence: Needs assistance      ADL's / Homemaking Assistance Needed: doesn't cook much, grandon helps with groceries        Hand Dominance        Extremity/Trunk Assessment   Upper Extremity Assessment Upper Extremity Assessment: Generalized weakness    Lower Extremity Assessment Lower Extremity Assessment: Generalized weakness    Cervical / Trunk Assessment Cervical / Trunk Assessment: Kyphotic  Communication   Communication: No difficulties  Cognition Arousal/Alertness: Awake/alert Behavior During Therapy: Flat affect Overall Cognitive Status: Within Functional Limits for tasks assessed                                        General Comments General comments (skin integrity, edema, etc.): felt too bad to attempt ambulation, discussed d/c plans    Exercises     Assessment/Plan    PT Assessment Patient needs continued PT services  PT Problem List Decreased strength;Decreased activity tolerance;Decreased  mobility;Decreased balance       PT Treatment Interventions DME instruction;Stair training;Therapeutic activities;Balance training;Therapeutic exercise;Functional mobility training;Gait training;Patient/family education    PT Goals (Current goals can be found in the Care Plan section)  Acute Rehab PT Goals Patient Stated Goal: to feel better PT Goal Formulation: With patient Time For Goal Achievement: 08/22/19 Potential to Achieve Goals: Good    Frequency Min 3X/week   Barriers to discharge        Co-evaluation               AM-PAC PT "6 Clicks" Mobility  Outcome Measure Help needed turning from your back to your side while in a flat bed without using bedrails?: None Help needed moving from lying on your back to  sitting on the side of a flat bed without using bedrails?: None Help needed moving to and from a bed to a chair (including a wheelchair)?: A Little Help needed standing up from a chair using your arms (e.g., wheelchair or bedside chair)?: A Little Help needed to walk in hospital room?: A Little Help needed climbing 3-5 steps with a railing? : A Little 6 Click Score: 20    End of Session   Activity Tolerance: Treatment limited secondary to medical complications (Comment)(nausea) Patient left: in bed;with call bell/phone within reach   PT Visit Diagnosis: Muscle weakness (generalized) (M62.81);Other abnormalities of gait and mobility (R26.89)    Time: 2683-4196 PT Time Calculation (min) (ACUTE ONLY): 19 min   Charges:   PT Evaluation $PT Eval Moderate Complexity: 1 Mod          Sheran Lawless, [facility] [phone] 08/08/2019   Elray Mcgregor 08/08/2019, 2:48 PM

## 2019-08-08 NOTE — TOC Transition Note (Addendum)
Transition of Care [facility]) - CM/SW Discharge Note   Patient Details  Name: [name] MRN: [id number] Date of Birth: [birth year]  [facility] CM/SW Contact:  Cherylann Parr, RN Phone Number: 08/08/2019, 5:52 PM   Clinical Narrative:  Pt is deemed stable to discharge home today.  Pt is refusing HH - states "I will think about it".  Pt denied barriers with returning to her home - pt states she has a son and a grandson (17)  that will provide 24 hour supervision.  Pt denies all barriers with returning home, pt states she has PCP and denied barriers with paying for discharge medciations. Pt received a RW during 2018 hospitalization.  CM contacted by Unit AD - requesting CM speak with pts son;  Pts son Rosanne Ashing  raised concerns with pt discharging home in weaknened state and with COVID, - pt has not seen mom in 3 weeks - pt assessed by [facility] - HH recommended with 24 hour supervision.  CM asked son if he could reside with pt at discharge adhering to covid precautions - son declined but did confirm that pts 72 year old grandson would be in the home at discharge.   Family also voiced concerns of Racial disparities in regards to pt being sent home so soon.  CM attempted to explain to family that race had nothing to do with pt being deemed stable for discharge.  CM attempted to discuss parameters for discharge however family changed conversation that lead to family initiated social media exposure related to racial bias.    CM again called pt and discussed discharging back home and asked if there were any known barriers - pt again stated no.  CM communicated concerns relayed by her family.    CM went ahead and  reviewed IM with pt via phone (pt alert and oriented , orientation also documented by bedside nurse).  Pt informed CM that she was not going to appeal discharge - pt states "I will be fine on my own - if I need something I know how to call".  Pt requested CM to relay to her son and  daughter-in-law that "I will be fine".  Pt again informed CM that she would not be alone at home.   CM printed IM to unit secretary and requested AD hand deliver to pt - pt is aware that IM will be hand delivered prior to discharge, pt denied questions or concerns with explanation regarding IM .  CM spoke with attending and relayed concerns of family and that pt declined [facility]  - attending has also had conversations with family, attending  continues to uphold discharge for home today  CM also discussed with [facility] supervisor - only pt can appeal if pt is alert and oriented - not family.   CM contacted pts son and daughter-in-law and communicated above message above from pt.  Update: 1840:    CM contacted by AD -  Pt now wants to appeal discharge - pt has IM with phone number for appeal.  [facility] supervisor notified   Final next level of care: Home/Self Care Barriers to Discharge: Barriers Resolved   Patient Goals and CMS Choice Patient states their goals for this hospitalization and ongoing recovery are:: to go home and take care of myself CMS Medicare.gov Compare Post Acute Care list provided to:: Patient Choice offered to / list presented to : Patient  Discharge Placement  Discharge Plan and Services   Discharge Planning Services: CM Consult                      HH Arranged: Refused [place]          Social Determinants of Health (SDOH) Interventions     Readmission Risk Interventions No flowsheet data found.

## 2019-08-09 DIAGNOSIS — N179 Acute kidney failure, unspecified: Secondary | ICD-10-CM

## 2019-08-09 DIAGNOSIS — E0590 Thyrotoxicosis, unspecified without thyrotoxic crisis or storm: Secondary | ICD-10-CM

## 2019-08-09 LAB — BASIC METABOLIC PANEL
Anion gap: 8 (ref 5–15)
BUN: 15 mg/dL (ref 8–23)
CO2: 23 mmol/L (ref 22–32)
Calcium: 8.3 mg/dL — ABNORMAL LOW (ref 8.9–10.3)
Chloride: 104 mmol/L (ref 98–111)
Creatinine, Ser: 1.04 mg/dL — ABNORMAL HIGH (ref 0.44–1.00)
GFR calc Af Amer: >60 mL/min (ref >60)
GFR calc non Af Amer: 54 mL/min — ABNORMAL LOW (ref >60)
Glucose, Bld: 150 mg/dL — ABNORMAL HIGH (ref 70–99)
Potassium: 3.7 mmol/L (ref 3.5–5.1)
Sodium: 135 mmol/L (ref 135–145)

## 2019-08-09 LAB — CBC WITH DIFFERENTIAL/PLATELET
Abs Immature Granulocytes: 0 10*3/uL (ref 0.00–0.07)
Basophils Absolute: 0 10*3/uL (ref 0.0–0.1)
Basophils Relative: 0 %
Eosinophils Absolute: 0 10*3/uL (ref 0.0–0.5)
Eosinophils Relative: 0 %
HCT: 39.6 % (ref 36.0–46.0)
Hemoglobin: 12.9 g/dL (ref 12.0–15.0)
Immature Granulocytes: 0 %
Lymphocytes Relative: 20 %
Lymphs Abs: 0.7 10*3/uL (ref 0.7–4.0)
MCH: 31.2 pg (ref 26.0–34.0)
MCHC: 32.6 g/dL (ref 30.0–36.0)
MCV: 95.9 fL (ref 80.0–100.0)
Monocytes Absolute: 0.1 10*3/uL (ref 0.1–1.0)
Monocytes Relative: 4 %
Neutro Abs: 2.5 10*3/uL (ref 1.7–7.7)
Neutrophils Relative %: 76 %
Platelets: 127 10*3/uL — ABNORMAL LOW (ref 150–400)
RBC: 4.13 MIL/uL (ref 3.87–5.11)
RDW: 13.8 % (ref 11.5–15.5)
WBC: 3.3 10*3/uL — ABNORMAL LOW (ref 4.0–10.5)
nRBC: 0 % (ref 0.0–0.2)

## 2019-08-09 LAB — MAGNESIUM: Magnesium: 1.8 mg/dL (ref 1.7–2.4)

## 2019-08-09 LAB — C-REACTIVE PROTEIN: CRP: 2.5 mg/dL — ABNORMAL HIGH (ref <1.0)

## 2019-08-09 LAB — FERRITIN: Ferritin: 639 ng/mL — ABNORMAL HIGH (ref 11–307)

## 2019-08-09 LAB — LACTATE DEHYDROGENASE: LDH: 211 U/L — ABNORMAL HIGH (ref 98–192)

## 2019-08-09 LAB — D-DIMER, QUANTITATIVE: D-Dimer, Quant: 0.62 ug/mL-FEU — ABNORMAL HIGH (ref 0.00–0.50)

## 2019-08-09 LAB — BRAIN NATRIURETIC PEPTIDE: B Natriuretic Peptide: 386.1 pg/mL — ABNORMAL HIGH (ref 0.0–100.0)

## 2019-08-09 MED ORDER — ACETAMINOPHEN 325 MG PO TABS
650.0000 mg | ORAL_TABLET | ORAL | Status: DC | PRN
  Administered 2019-08-11 – 2019-08-14 (×5): 650 mg via ORAL
  Filled 2019-08-09 (×6): qty 2

## 2019-08-09 MED ORDER — INFLUENZA VAC A&B SA ADJ QUAD 0.5 ML IM PRSY
0.5000 mL | PREFILLED_SYRINGE | INTRAMUSCULAR | Status: DC
  Filled 2019-08-09: qty 0.5

## 2019-08-09 MED ORDER — DEXAMETHASONE 4 MG PO TABS
6.0000 mg | ORAL_TABLET | Freq: Every day | ORAL | Status: DC
  Administered 2019-08-09 – 2019-08-10 (×2): 6 mg via ORAL
  Filled 2019-08-09 (×2): qty 2

## 2019-08-09 MED ORDER — DEXAMETHASONE 6 MG PO TABS: 6.0000 mg | ORAL_TABLET | Freq: Every day | ORAL | 0 refills | Status: DC

## 2019-08-09 NOTE — Care Management (Addendum)
CM reached out to [facility] office - no appeal received from [facility]  CM contacted pt and was informed that Judithann Graves was in fact called last night by both pt and daughter. Detailed Notice of Discharge has not been given as appeal has not yet been verified.   CM requested assistance/intervention from [place] and CSW (CM working remote).    Update 1354:  CM informed by [facility] CMA that appeal from Rosalyn Gess has now  been received.  CSW will deliver/explain Detailed Notice of Discharge to patient and gain signature.

## 2019-08-09 NOTE — Progress Notes (Signed)
PROGRESS NOTE    Sherlene ShamsCary Mcglinn  LKG:[id number] DOB: 09/14/1946 DOA: 08/05/2019 PCP: [name], Stephen O, MD    Brief Narrative:  Patient is a 73 year old female with history of coronary artery disease, status post cardiac cath with nonobstructive lesions in 8/18,history of hypertension, hyperlipidemia who presents to the emergency department with complaints of tightness in the left chest radiating to the left arm, worsening shortness of breath. She reported history of exposure to COVID about 2 weeks ago but tested negative for COVID about a week ago. She complained of general weakness on presentation. On presentation, her BNP was elevated 2000 and had mild elevated troponins. EKG showed ST depression in the inferolateral leads. Cardiology consulted. Echo showed reduced EF.She is currently COVID symptom-free except for generalized weakness and is saturating fine on room air. There is no plan for intervention by cardiology for now.  She will be followed by cardiology as an outpatient.  Medication adjusted.  Currently she is hemodynamically stable for discharge.  Respiratory status has been stable since admission.  She was seen by physical therapy and recommended home health on discharge.   Assessment & Plan:   Principal Problem:   Chest pain Active Problems:   HTN (hypertension)   Coronary artery disease involving native coronary artery of native heart with angina pectoris (HCC)   Hyperthyroidism   Demand ischemia (HCC)   COVID-19 virus infection   COVID-19   #1 acute on chronic chronic combined systolic and diastolic CHF exacerbation Patient had presented with shortness of breath, elevated BNP.  Chest x-ray done was negative for pneumonia effusion or pulmonary edema.  Patient received a dose of Lasix with some clinical improvement.  2D echo showed a EF of 35 to 40%, moderately decreased left ventricular function, pseudonormalization pattern of LV diastolic filling, severely dilated left  atrium.  2D echo from December 2018 had a EF of 50 to 55%.  Patient seen in consultation by cardiology recommended continued cardiac medications of Norvasc, Lipitor, metoprolol succinate.  Outpatient follow-up with cardiology.  2  COVID-19 infection Patient presented with shortness of breath which felt could be from possible congestive heart failure.  Patient denies any shortness of breath today.  Denies any ongoing chest pain.  Chest x-ray with no acute infiltrate.  Patient with sats of percent on room air.  Inflammatory markers elevated.  Will place patient on a Decadron taper.  Patient and family hesitant to be discharged with COVID-19 infection however patient not hypoxic and denies any ongoing shortness of breath.  Will need outpatient follow-up with PCP.  3.  Chest pain Patient noted with history of coronary vasospasm.  On admission troponin was noted to be slightly elevated however seem to have plateaued.  Patient seen in consultation by cardiology who feel this is consistent with demand ischemia rather than acute coronary syndrome.  Patient also noted to have some pronounced ST depression in inferior leads on 08/05/2019.  Nitropaste ordered however was stopped due to hypotension.  Will discontinue Nitropaste of MAR.  Patient known noted to have decreased EF.  Patient started on amlodipine which was increased to 5 mg daily.  Cardiology.  Continue aspirin 81 mg daily.  Patient placed on Toprol-XL and Lipitor.  Outpatient follow-up with cardiology.  4.  Acute kidney injury Resolved.  5.  History of coronary artery disease Patient seen by cardiology.  No further invasive cardiac work-up needed at this time.  Outpatient follow-up.  6.  Left upper lobe nodule Noted on chest x-ray which was a  rounded 1.1 cm nodular density projecting within left upper lobe.  Will need outpatient CT for follow-up which can be done after resolution of patient's COVID.  7.  Hyperthyroidism Patient with history of  Graves' disease/multinodular goiter.  Continue methimazole.  Propranolol changed to metoprolol per cardiology.  Outpatient follow-up with endocrinology.  8.  Hypertension Stable.  Monitor while on Toprol-XL and amlodipine.  9.  Gastroesophageal reflux disease PPI.  10.  Mild thrombocytopenia Likely secondary to viral illness.  Follow.    DVT prophylaxis: Lovenox Code Status: Full Family Communication: Updated patient.  No family at bedside. Disposition Plan: Patient discharged however family is appealing discharge.   Consultants:   : Dr. Purvis Sheffield 08/05/2019  Procedures:  Chest x-ray 08/05/2019, 08/07/2019  2D echo 08/07/2019  Antimicrobials:  None   Subjective: Patient denies any chest pain.  Denies any significant shortness of breath.  Feeling better than on admission.  It is noted that the family appealing discharge.  Objective: Vitals:   08/08/19 1800 08/08/19 2155 08/08/19 2353 08/09/19 0842  BP: 115/75 113/64 132/78 133/65  Pulse:    78  Resp: 18 20 16 17   Temp: 97.9 F (36.6 C) 99.2 F (37.3 C) 98.8 F (37.1 C) 100.2 F (37.9 C)  TempSrc: Oral Oral Oral Oral  SpO2: 100% 99% 98% 96%  Weight:      Height:        Intake/Output Summary (Last 24 hours) at 08/09/2019 1015 Last data filed at 08/09/2019 0804 Gross per 24 hour  Intake 850 ml  Output -  Net 850 ml   Filed Weights   08/05/19 1306 08/06/19 2216  Weight: 61.2 kg 60.4 kg    Examination:  General exam: Appears calm and comfortable  Respiratory system: Clear to auscultation. Respiratory effort normal. Cardiovascular system: S1 & S2 heard, RRR. No JVD, murmurs, rubs, gallops or clicks. No pedal edema. Gastrointestinal system: Abdomen is nondistended, soft and nontender. No organomegaly or masses felt. Normal bowel sounds heard. Central nervous system: Alert and oriented. No focal neurological deficits. Extremities: Symmetric 5 x 5 power. Skin: No rashes, lesions or ulcers Psychiatry:  Judgement and insight appear normal. Mood & affect appropriate.     Data Reviewed: I have personally reviewed following labs and imaging studies  CBC: Recent Labs  Lab 08/05/19 1019 08/07/19 0529 08/08/19 0442  WBC 3.8* 3.5* 3.3*  NEUTROABS  --  2.5 1.9  HGB 14.9 13.2 12.9  HCT 45.6 39.0 38.6  MCV 96.0 95.6 95.3  PLT 184 104* 112*   Basic Metabolic Panel: Recent Labs  Lab 08/05/19 1019 08/05/19 1114 08/06/19 0903 08/07/19 0529 08/08/19 0442 08/09/19 0722  NA 135  --  135 134* 135  --   K 3.7  --  3.2* 4.3 3.9  --   CL 101  --  100 103 105  --   CO2 21*  --  21* 19* 22  --   GLUCOSE 117*  --  168* 127* 94  --   BUN 12  --  34* 51* 31*  --   CREATININE 1.22*  --  1.52* 2.65* 1.17*  --   CALCIUM 9.3  --  9.0 8.5* 8.2*  --   MG  --  1.9  --  2.0 1.9 1.8  PHOS  --  3.9  --   --   --   --    GFR: Estimated Creatinine Clearance: 40.7 mL/min (A) (by C-G formula based on SCr of 1.17 mg/dL (H)). Liver Function  Tests: Recent Labs  Lab 08/05/19 1114 08/07/19 0529 08/08/19 0442  AST 25 27 25   ALT 24 16 17   ALKPHOS 77 58 48  BILITOT 1.4* 0.9 0.6  PROT 8.2* 6.8 6.4*  ALBUMIN 3.9 3.3* 2.9*   Recent Labs  Lab 08/05/19 1114  LIPASE 25   No results for input(s): AMMONIA in the last 168 hours. Coagulation Profile: Recent Labs  Lab 08/05/19 1114  INR 1.2   Cardiac Enzymes: No results for input(s): CKTOTAL, CKMB, CKMBINDEX, TROPONINI in the last 168 hours. BNP (last 3 results) No results for input(s): PROBNP in the last 8760 hours. HbA1C: No results for input(s): HGBA1C in the last 72 hours. CBG: Recent Labs  Lab 08/05/19 1402  GLUCAP 100*   Lipid Profile: No results for input(s): CHOL, HDL, LDLCALC, TRIG, CHOLHDL, LDLDIRECT in the last 72 hours. Thyroid Function Tests: No results for input(s): TSH, T4TOTAL, FREET4, T3FREE, THYROIDAB in the last 72 hours. Anemia Panel: Recent Labs    08/08/19 0442 08/09/19 0722  FERRITIN 587* 639*   Sepsis  Labs: Recent Labs  Lab 08/05/19 1114 08/07/19 0529 08/07/19 0816  LATICACIDVEN 1.8 1.2 0.9    Recent Results (from the past 240 hour(s))  SARS CORONAVIRUS 2 (TAT 6-24 HRS) Nasopharyngeal Nasopharyngeal Swab     Status: Abnormal   Collection Time: 08/05/19 12:59 PM   Specimen: Nasopharyngeal Swab  Result Value Ref Range Status   SARS Coronavirus 2 POSITIVE (A) NEGATIVE Final    Comment: RESULT CALLED TO, READ BACK BY AND VERIFIED WITH: B OSORIO,RN AT 2016 08/05/2019 BY L BENFIELD (NOTE) SARS-CoV-2 target nucleic acids are DETECTED. The SARS-CoV-2 RNA is generally detectable in upper and lower respiratory specimens during the acute phase of infection. Positive results are indicative of active infection with SARS-CoV-2. Clinical  correlation with patient history and other diagnostic information is necessary to determine patient infection status. Positive results do  not rule out bacterial infection or co-infection with other viruses. The expected result is Negative. Fact Sheet for Patients: SugarRoll.be Fact Sheet for Healthcare Providers: https://www.woods-mathews.com/ This test is not yet approved or cleared by the [place] FDA and  has been authorized for detection and/or diagnosis of SARS-CoV-2 by FDA under an Emergency Use Authorization (EUA). This EUA will remain  in effect (meaning this test can be [place] ed) for the duration of the COVID-19 declaration under Section 564(b)(1) of the Act, 21 U.S.C. section 360bbb-3(b)(1), unless the authorization is terminated or revoked sooner. Performed at [facility], [place] [address] [place], [place] [address]   MRSA PCR Screening     Status: None   Collection Time: 08/06/19  1:04 PM   Specimen: Nasopharyngeal  Result Value Ref Range Status   MRSA by PCR NEGATIVE NEGATIVE Final    Comment:        The GeneXpert MRSA Assay (FDA approved for NASAL specimens only), is one component of  a comprehensive MRSA colonization surveillance program. It is not intended to diagnose MRSA infection nor to guide or monitor treatment for MRSA infections. Performed at [facility], [place] [address], [place], [place] [address]          Radiology Studies: No results found.      Scheduled Meds: . amLODipine  2.5 mg Oral Daily  . aspirin  325 mg Oral QODAY  . atorvastatin  20 mg Oral Daily  . dexamethasone  6 mg Oral Daily  . enoxaparin (LOVENOX) injection  40 mg Subcutaneous Daily  .  ezetimibe  10 mg Oral Daily  . ferrous sulfate  325 mg Oral Q breakfast  . methimazole  40 mg Oral BID  . metoprolol succinate  25 mg Oral Daily  . nitroGLYCERIN  1 inch Topical Q6H  . oxybutynin  10 mg Oral Daily  . pantoprazole  40 mg Oral Daily  . sodium chloride flush  3 mL Intravenous Q12H  . venlafaxine XR  150 mg Oral Q breakfast  . vitamin C  500 mg Oral BID  . zinc sulfate  220 mg Oral Daily   Continuous Infusions: . sodium chloride       LOS: 2 days    Time spent: 35 minutes    Ramiro Harvest, MD [facility]  If 7PM-7AM, please contact night-coverage [email or web address] 08/09/2019, 10:15 AM

## 2019-08-09 NOTE — Progress Notes (Signed)
CSW confirmed with MD that patient remains stable for discharge today. RNCM to continue to work towards the patient/family appealing the discharge.  Laveda Abbe, [place] Clinical Social Worker [phone]

## 2019-08-09 NOTE — Progress Notes (Signed)
Physical Therapy Treatment Patient Details Name: [name] MRN: [id number] DOB: October 12, 1946 Today's Date: 08/09/2019    History of Present Illness Patient is a 73 year old female with history of coronary artery disease, status post cardiac cath with nonobstructive lesions in 8/18,history of hypertension, hyperlipidemia admitted with chest pain positive eleveated BNP and reduced EF.  Work up with serial HS troponins thought not ACS.  Found to be covid positive.    PT Comments    Pt reports being very uncomfortable in the bed and is agreeable to working with therapy and having therapist adjust bed for increased comfort. Pt with reports of headache that has not responded to Tylenol. RN notified. Pt is mod I for bed mobility, supervision for transfers and hands on min guard for ambulation of 20 feet with RW. PT continues to recommend HHPT level rehab at d/c to work on strength and balance. PT will continue to follow acutely.     Follow Up Recommendations  Home health PT;Supervision/Assistance - 24 hour     Equipment Recommendations  None recommended by PT       Precautions / Restrictions Precautions Precautions: None Restrictions Weight Bearing Restrictions: No    Mobility  Bed Mobility Overal bed mobility: Modified Independent             General bed mobility comments: supine to sit and sit to supine  Transfers Overall transfer level: Needs assistance Equipment used: Rolling walker (2 wheeled) Transfers: Sit to/from Stand Sit to Stand: Supervision         General transfer comment: supervision for safety  Ambulation/Gait Ambulation/Gait assistance: Min guard Gait Distance (Feet): 20 Feet Assistive device: Rolling walker (2 wheeled) Gait Pattern/deviations: Step-through pattern;Decreased step length - right;Decreased step length - left;Shuffle;Narrow base of support;Trunk flexed Gait velocity: slowed Gait velocity interpretation: <1.31 ft/sec, indicative of  household ambulator General Gait Details: hands on min guard for mild instability, no overt LoB, vc for wider BoS with ambulation        Balance Overall balance assessment: Needs assistance   Sitting balance-Leahy Scale: Good Sitting balance - Comments: sat EOB about 10 minutes trying to help nausea eating crackers and drinking ginger ale   Standing balance support: No upper extremity supported;During functional activity;Bilateral upper extremity supported Standing balance-Leahy Scale: Fair Standing balance comment: needs UE support for dynamic balance                            Cognition Arousal/Alertness: Awake/alert Behavior During Therapy: Flat affect Overall Cognitive Status: Within Functional Limits for tasks assessed                                           General Comments General comments (skin integrity, edema, etc.): Pt requesting something stronger than Tylenol for headache, VSS      Pertinent Vitals/Pain Pain Assessment: 0-10 Pain Score: 5  Pain Location: headache Pain Descriptors / Indicators: Headache Pain Intervention(s): Limited activity within patient's tolerance;Monitored during session;Repositioned;Patient requesting pain meds-RN notified           PT Goals (current goals can now be found in the care plan section) Acute Rehab PT Goals Patient Stated Goal: to feel better PT Goal Formulation: With patient Time For Goal Achievement: 08/22/19 Potential to Achieve Goals: Good Progress towards PT goals: Progressing toward goals    Frequency  Min 3X/week      PT Plan Current plan remains appropriate       AM-PAC PT "6 Clicks" Mobility   Outcome Measure  Help needed turning from your back to your side while in a flat bed without using bedrails?: None Help needed moving from lying on your back to sitting on the side of a flat bed without using bedrails?: None Help needed moving to and from a bed to a chair  (including a wheelchair)?: A Little Help needed standing up from a chair using your arms (e.g., wheelchair or bedside chair)?: A Little Help needed to walk in hospital room?: A Little Help needed climbing 3-5 steps with a railing? : A Little 6 Click Score: 20    End of Session   Activity Tolerance: Treatment limited secondary to medical complications (Comment)(nausea) Patient left: in bed;with call bell/phone within reach Nurse Communication: Mobility status;Other (comment);Patient requests pain meds(request to check bed alarm ) PT Visit Diagnosis: Muscle weakness (generalized) (M62.81);Other abnormalities of gait and mobility (R26.89)     Time: 1610-1630 PT Time Calculation (min) (ACUTE ONLY): 20 min  Charges:  $Gait Training: 8-22 mins                     Cashe Gatt B. Migdalia Dk PT, DPT Acute Rehabilitation Services Pager [phone] Office [phone]    [place] 08/09/2019, 4:49 PM

## 2019-08-09 NOTE — Progress Notes (Signed)
Per MD patient is medically ready for discharge home. Patient is refusing discharge and has called [facility] for appeal.  MD is aware of appeal. CM MOA is to fax the required information to [facility], [place]. Case ID: [id number]. Pt provided detailed notice of d/c.

## 2019-08-10 LAB — BASIC METABOLIC PANEL
Anion gap: 10 (ref 5–15)
BUN: 18 mg/dL (ref 8–23)
CO2: 24 mmol/L (ref 22–32)
Calcium: 8.8 mg/dL — ABNORMAL LOW (ref 8.9–10.3)
Chloride: 101 mmol/L (ref 98–111)
Creatinine, Ser: 1.3 mg/dL — ABNORMAL HIGH (ref 0.44–1.00)
GFR calc Af Amer: 47 mL/min — ABNORMAL LOW (ref >60)
GFR calc non Af Amer: 41 mL/min — ABNORMAL LOW (ref >60)
Glucose, Bld: 129 mg/dL — ABNORMAL HIGH (ref 70–99)
Potassium: 4.6 mmol/L (ref 3.5–5.1)
Sodium: 135 mmol/L (ref 135–145)

## 2019-08-10 LAB — FERRITIN: Ferritin: 716 ng/mL — ABNORMAL HIGH (ref 11–307)

## 2019-08-10 LAB — CBC
HCT: 41.3 % (ref 36.0–46.0)
Hemoglobin: 13.5 g/dL (ref 12.0–15.0)
MCH: 31.3 pg (ref 26.0–34.0)
MCHC: 32.7 g/dL (ref 30.0–36.0)
MCV: 95.6 fL (ref 80.0–100.0)
Platelets: 134 10*3/uL — ABNORMAL LOW (ref 150–400)
RBC: 4.32 MIL/uL (ref 3.87–5.11)
RDW: 13.8 % (ref 11.5–15.5)
WBC: 2.5 10*3/uL — ABNORMAL LOW (ref 4.0–10.5)
nRBC: 0 % (ref 0.0–0.2)

## 2019-08-10 LAB — MAGNESIUM: Magnesium: 1.9 mg/dL (ref 1.7–2.4)

## 2019-08-10 LAB — C-REACTIVE PROTEIN: CRP: 2.5 mg/dL — ABNORMAL HIGH (ref <1.0)

## 2019-08-10 LAB — LACTATE DEHYDROGENASE: LDH: 218 U/L — ABNORMAL HIGH (ref 98–192)

## 2019-08-10 LAB — D-DIMER, QUANTITATIVE: D-Dimer, Quant: 0.5 ug/mL-FEU (ref 0.00–0.50)

## 2019-08-10 MED ORDER — DEXAMETHASONE 4 MG PO TABS
4.0000 mg | ORAL_TABLET | Freq: Every day | ORAL | Status: DC
  Administered 2019-08-11 – 2019-08-14 (×4): 4 mg via ORAL
  Filled 2019-08-10 (×4): qty 1

## 2019-08-10 MED ORDER — METOPROLOL SUCCINATE ER 25 MG PO TB24
37.5000 mg | ORAL_TABLET | Freq: Every day | ORAL | Status: DC
  Administered 2019-08-11 – 2019-08-14 (×4): 37.5 mg via ORAL
  Filled 2019-08-10 (×4): qty 2

## 2019-08-10 MED ORDER — METOPROLOL SUCCINATE ER 25 MG PO TB24
12.5000 mg | ORAL_TABLET | Freq: Once | ORAL | Status: AC
  Administered 2019-08-10: 12.5 mg via ORAL
  Filled 2019-08-10: qty 1

## 2019-08-10 MED ORDER — MAGNESIUM SULFATE IN D5W 1-5 GM/100ML-% IV SOLN
1.0000 g | Freq: Once | INTRAVENOUS | Status: AC
  Administered 2019-08-10: 1 g via INTRAVENOUS
  Filled 2019-08-10: qty 100

## 2019-08-10 NOTE — Progress Notes (Addendum)
PROGRESS NOTE    Katherine ShamsCary Stobaugh  ZOX:[id number] DOB: 06/20/1946 DOA: 08/05/2019 PCP: [name], Stephen O, MD    Brief Narrative:  Patient is a 73 year old female with history of coronary artery disease, status post cardiac cath with nonobstructive lesions in 8/18,history of hypertension, hyperlipidemia who presents to the emergency department with complaints of tightness in the left chest radiating to the left arm, worsening shortness of breath. She reported history of exposure to COVID about 2 weeks ago but tested negative for COVID about a week ago. She complained of general weakness on presentation. On presentation, her BNP was elevated 2000 and had mild elevated troponins. EKG showed ST depression in the inferolateral leads. Cardiology consulted. Echo showed reduced EF.She is currently COVID symptom-free except for generalized weakness and is saturating fine on room air. There is no plan for intervention by cardiology for now.  She will be followed by cardiology as an outpatient.  Medication adjusted.  Currently she is hemodynamically stable for discharge.  Respiratory status has been stable since admission.  She was seen by physical therapy and recommended home health on discharge.   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  Chest x-ray done was negative for pneumonia effusion or pulmonary edema.  Patient received a dose of Lasix with some clinical improvement.  2D echo showed a EF of 35 to 40%, moderately decreased left ventricular function, pseudonormalization pattern of LV  diastolic filling, severely dilated left atrium.  2D echo from December 2018 had a EF of 50 to 55%.  Patient seen in consultation by cardiology recommended continued cardiac medications of Norvasc, Lipitor, metoprolol succinate.  Outpatient follow-up with cardiology.  2  COVID-19 infection Patient presented with shortness of breath which felt could be from possible congestive heart failure.  Patient denies any shortness of breath today.  Denies any ongoing chest pain.  Chest x-ray with no acute infiltrate.  Patient with sats of 99 percent on room air.  Inflammatory markers elevated.  Decrease Decadron to 4 mg daily x2 to 3 days, then 2 mg daily x3 days and then stop. Patient and family hesitant to be discharged with COVID-19 infection however patient not hypoxic and denies any ongoing shortness of breath.  Will need outpatient follow-up with PCP.  3.  Chest pain Patient noted with history of coronary vasospasm.  On admission troponin was noted to be slightly elevated however seem to have plateaued.  Patient seen in consultation by cardiology who feel this is consistent with demand ischemia rather than acute coronary syndrome.  Patient also noted to have some pronounced ST depression in inferior leads on 08/05/2019.  Nitropaste ordered however was stopped due to hypotension.  Will discontinue Nitropaste of MAR.  Patient known noted to have decreased EF.  Patient started on amlodipine which was increased to 5 mg daily.  Cardiology.  Continue aspirin 81 mg daily.  Patient placed on Toprol-XL and Lipitor.  Increase Toprol-XL to 37.5 mg daily due to nonsustained V. tach.  Outpatient follow-up with cardiology.  4.  Acute kidney injury Resolved.  5.  History of coronary artery disease  Patient seen by cardiology.  No further invasive cardiac work-up needed at this time.  Outpatient follow-up.  6.  Left upper lobe nodule Noted on chest x-ray which was a rounded 1.1 cm nodular density projecting within left  upper lobe.  outpatient CT for follow-up which can be done after resolution of patient's COVID.  7.  Hyperthyroidism Patient with history of Graves' disease/multinodular goiter.  Continue methimazole.  Propranolol changed to metoprolol per cardiology.  Increase Toprol-XL to 37.5 mg daily.  Outpatient follow-up with endocrinology.  8.  Hypertension Stable.  Monitor while on Toprol-XL and amlodipine.  9.  Gastroesophageal reflux disease Continue PPI.  10.  Mild thrombocytopenia Likely secondary to viral illness.  Slowly improving.  Follow.  11.  Asymptomatic nonsustained V. tach Palpation patient with asymptomatic nonsustained V. tach overnight and early this morning.  Magnesium at 1.9.  Potassium at 4.6.  We will give magnesium 1 g IV x1 to keep magnesium greater than 2.  Keep potassium greater than 4.  Increase Toprol to 37.5 mg daily.  Outpatient follow-up with cardiology.    DVT prophylaxis: Lovenox Code Status: Full Family Communication: Updated patient. Updated daughter on phone. Disposition Plan: Patient discharged however family is appealing discharge.  Hopefully home in the next 24 to 48 hours.   Consultants:   : Dr. Purvis Sheffield 08/05/2019  Procedures:  Chest x-ray 08/05/2019, 08/07/2019  2D echo 08/07/2019  Antimicrobials:  None   Subjective: Patient sitting up in bed.  Denies any chest pain.  Denies any shortness of breath.  Tolerating current diet.  Per RN patient with 2 short bursts of nonsustained V. tach overnight and early this morning prior to her getting her metoprolol this morning.  Objective: Vitals:   08/09/19 0842 08/09/19 1728 08/09/19 2300 08/10/19 0800  BP: 133/65 129/69 119/72 131/85  Pulse: 78 71 67 88  Resp: 17 18    Temp: 100.2 F (37.9 C) 98.2 F (36.8 C) 98.8 F (37.1 C) 98.7 F (37.1 C)  TempSrc: Oral Oral Oral Oral  SpO2: 96% 95% 99% 99%  Weight:      Height:        Intake/Output Summary (Last 24 hours) at 08/10/2019 0857 Last  data filed at 08/10/2019 0300 Gross per 24 hour  Intake 603 ml  Output 200 ml  Net 403 ml   Filed Weights   08/05/19 1306 08/06/19 2216  Weight: 61.2 kg 60.4 kg    Examination:  General exam: NAD Respiratory system: Lungs clear to auscultation bilaterally.  No wheezes, no crackles, no rhonchi.  Normal respiratory effort.   Cardiovascular system: Regular rate and rhythm no murmurs rubs or gallops.  No JVD.  No lower extremity edema. Gastrointestinal system: Abdomen is soft, nontender, nondistended, positive bowel sounds.  No rebound.  No guarding.  Central nervous system: Alert and oriented. No focal neurological deficits. Extremities: Symmetric 5 x 5 power. Skin: No rashes, lesions or ulcers Psychiatry: Judgement and insight appear normal. Mood & affect appropriate.     Data Reviewed: I have personally reviewed following labs and imaging studies  CBC: Recent Labs  Lab 08/05/19 1019 08/07/19 0529 08/08/19 0442 08/09/19 0942 08/10/19 0459  WBC 3.8* 3.5* 3.3* 3.3* 2.5*  NEUTROABS  --  2.5 1.9 2.5  --   HGB 14.9 13.2 12.9 12.9 13.5  HCT 45.6 39.0 38.6 39.6 41.3  MCV 96.0 95.6 95.3 95.9 95.6  PLT 184 104* 112* 127* 134*   Basic Metabolic Panel: Recent Labs  Lab 08/05/19 1114 08/06/19  8676 08/07/19 0529 08/08/19 0442 08/09/19 0722 08/09/19 0942 08/10/19 0459  NA  --  135 134* 135  --  135 135  K  --  3.2* 4.3 3.9  --  3.7 4.6  CL  --  100 103 105  --  104 101  CO2  --  21* 19* 22  --  23 24  GLUCOSE  --  168* 127* 94  --  150* 129*  BUN  --  34* 51* 31*  --  15 18  CREATININE  --  1.52* 2.65* 1.17*  --  1.04* 1.30*  CALCIUM  --  9.0 8.5* 8.2*  --  8.3* 8.8*  MG 1.9  --  2.0 1.9 1.8  --  1.9  PHOS 3.9  --   --   --   --   --   --    GFR: Estimated Creatinine Clearance: 36.6 mL/min (A) (by C-G formula based on SCr of 1.3 mg/dL (H)). Liver Function Tests: Recent Labs  Lab 08/05/19 1114 08/07/19 0529 08/08/19 0442  AST 25 27 25   ALT 24 16 17   ALKPHOS 77  58 48  BILITOT 1.4* 0.9 0.6  PROT 8.2* 6.8 6.4*  ALBUMIN 3.9 3.3* 2.9*   Recent Labs  Lab 08/05/19 1114  LIPASE 25   No results for input(s): AMMONIA in the last 168 hours. Coagulation Profile: Recent Labs  Lab 08/05/19 1114  INR 1.2   Cardiac Enzymes: No results for input(s): CKTOTAL, CKMB, CKMBINDEX, TROPONINI in the last 168 hours. BNP (last 3 results) No results for input(s): PROBNP in the last 8760 hours. HbA1C: No results for input(s): HGBA1C in the last 72 hours. CBG: Recent Labs  Lab 08/05/19 1402  GLUCAP 100*   Lipid Profile: No results for input(s): CHOL, HDL, LDLCALC, TRIG, CHOLHDL, LDLDIRECT in the last 72 hours. Thyroid Function Tests: No results for input(s): TSH, T4TOTAL, FREET4, T3FREE, THYROIDAB in the last 72 hours. Anemia Panel: Recent Labs    08/09/19 0722 08/10/19 0459  FERRITIN 639* 716*   Sepsis Labs: Recent Labs  Lab 08/05/19 1114 08/07/19 0529 08/07/19 0816  LATICACIDVEN 1.8 1.2 0.9    Recent Results (from the past 240 hour(s))  SARS CORONAVIRUS 2 (TAT 6-24 HRS) Nasopharyngeal Nasopharyngeal Swab     Status: Abnormal   Collection Time: 08/05/19 12:59 PM   Specimen: Nasopharyngeal Swab  Result Value Ref Range Status   SARS Coronavirus 2 POSITIVE (A) NEGATIVE Final    Comment: RESULT CALLED TO, READ BACK BY AND VERIFIED WITH: B OSORIO,RN AT 2016 08/05/2019 BY L BENFIELD (NOTE) SARS-CoV-2 target nucleic acids are DETECTED. The SARS-CoV-2 RNA is generally detectable in upper and lower respiratory specimens during the acute phase of infection. Positive results are indicative of active infection with SARS-CoV-2. Clinical  correlation with patient history and other diagnostic information is necessary to determine patient infection status. Positive results do  not rule out bacterial infection or co-infection with other viruses. The expected result is Negative. Fact Sheet for Patients: 2017 Fact  Sheet for Healthcare Providers: 10/05/2019 This test is not yet approved or cleared by the [employer] FDA and  has been authorized for detection and/or diagnosis of SARS-CoV-2 by FDA under an Emergency Use Authorization (EUA). This EUA will remain  in effect (meaning this test can be [employer] ed) for the duration of the COVID-19 declaration under Section 564(b)(1) of the Act, 21 U.S.C. section 360bbb-3(b)(1), unless the authorization is terminated or revoked sooner. Performed at [facility]  [facility], [address], [place], [place] [address]   MRSA PCR Screening     Status: None   Collection Time: 08/06/19  1:04 PM   Specimen: Nasopharyngeal  Result Value Ref Range Status   MRSA by PCR NEGATIVE NEGATIVE Final    Comment:        The GeneXpert MRSA Assay (FDA approved for NASAL specimens only), is one component of a comprehensive MRSA colonization surveillance program. It is not intended to diagnose MRSA infection nor to guide or monitor treatment for MRSA infections. Performed at [facility], [place] [address], [place], [place] [address]          Radiology Studies: No results found.      Scheduled Meds: . amLODipine  2.5 mg Oral Daily  . aspirin  325 mg Oral QODAY  . atorvastatin  20 mg Oral Daily  . dexamethasone  6 mg Oral Daily  . enoxaparin (LOVENOX) injection  40 mg Subcutaneous Daily  . ezetimibe  10 mg Oral Daily  . ferrous sulfate  325 mg Oral Q breakfast  . influenza vaccine adjuvanted  0.5 mL Intramuscular Tomorrow-1000  . methimazole  40 mg Oral BID  . metoprolol succinate  25 mg Oral Daily  . oxybutynin  10 mg Oral Daily  . pantoprazole  40 mg Oral Daily  . sodium chloride flush  3 mL Intravenous Q12H  . venlafaxine XR  150 mg Oral Q breakfast  . vitamin C  500 mg Oral BID  . zinc sulfate  220 mg Oral Daily   Continuous Infusions: . sodium chloride    . magnesium sulfate bolus IVPB       LOS: 3 days    Time  spent: 35 minutes    Irine Seal, MD [facility]  If 7PM-7AM, please contact night-coverage [email or web address] 08/10/2019, 8:57 AM

## 2019-08-11 ENCOUNTER — Other Ambulatory Visit: Payer: Self-pay

## 2019-08-11 LAB — FERRITIN: Ferritin: 845 ng/mL — ABNORMAL HIGH (ref 11–307)

## 2019-08-11 LAB — BASIC METABOLIC PANEL
Anion gap: 8 (ref 5–15)
BUN: 22 mg/dL (ref 8–23)
CO2: 25 mmol/L (ref 22–32)
Calcium: 8.7 mg/dL — ABNORMAL LOW (ref 8.9–10.3)
Chloride: 103 mmol/L (ref 98–111)
Creatinine, Ser: 1.03 mg/dL — ABNORMAL HIGH (ref 0.44–1.00)
GFR calc Af Amer: >60 mL/min (ref >60)
GFR calc non Af Amer: 54 mL/min — ABNORMAL LOW (ref >60)
Glucose, Bld: 112 mg/dL — ABNORMAL HIGH (ref 70–99)
Potassium: 4.4 mmol/L (ref 3.5–5.1)
Sodium: 136 mmol/L (ref 135–145)

## 2019-08-11 LAB — URINALYSIS, ROUTINE W REFLEX MICROSCOPIC
Bilirubin Urine: NEGATIVE
Glucose, UA: NEGATIVE mg/dL
Hgb urine dipstick: NEGATIVE
Ketones, ur: NEGATIVE mg/dL
Leukocytes,Ua: NEGATIVE
Nitrite: NEGATIVE
Protein, ur: 100 mg/dL — AB
RBC / HPF: >50 RBC/hpf — ABNORMAL HIGH (ref 0–5)
Specific Gravity, Urine: 1.024 (ref 1.005–1.030)
pH: 5 (ref 5.0–8.0)

## 2019-08-11 LAB — CBC WITH DIFFERENTIAL/PLATELET
Abs Immature Granulocytes: 0.02 10*3/uL (ref 0.00–0.07)
Basophils Absolute: 0 10*3/uL (ref 0.0–0.1)
Basophils Relative: 0 %
Eosinophils Absolute: 0 10*3/uL (ref 0.0–0.5)
Eosinophils Relative: 0 %
HCT: 40.2 % (ref 36.0–46.0)
Hemoglobin: 13.5 g/dL (ref 12.0–15.0)
Immature Granulocytes: 0 %
Lymphocytes Relative: 17 %
Lymphs Abs: 1 10*3/uL (ref 0.7–4.0)
MCH: 31.7 pg (ref 26.0–34.0)
MCHC: 33.6 g/dL (ref 30.0–36.0)
MCV: 94.4 fL (ref 80.0–100.0)
Monocytes Absolute: 0.4 10*3/uL (ref 0.1–1.0)
Monocytes Relative: 8 %
Neutro Abs: 4.2 10*3/uL (ref 1.7–7.7)
Neutrophils Relative %: 75 %
Platelets: 142 10*3/uL — ABNORMAL LOW (ref 150–400)
RBC: 4.26 MIL/uL (ref 3.87–5.11)
RDW: 13.7 % (ref 11.5–15.5)
WBC: 5.6 10*3/uL (ref 4.0–10.5)
nRBC: 0 % (ref 0.0–0.2)

## 2019-08-11 LAB — D-DIMER, QUANTITATIVE: D-Dimer, Quant: 0.54 ug/mL-FEU — ABNORMAL HIGH (ref 0.00–0.50)

## 2019-08-11 LAB — C-REACTIVE PROTEIN: CRP: 0.9 mg/dL (ref <1.0)

## 2019-08-11 LAB — LACTATE DEHYDROGENASE: LDH: 266 U/L — ABNORMAL HIGH (ref 98–192)

## 2019-08-11 LAB — MAGNESIUM: Magnesium: 2.2 mg/dL (ref 1.7–2.4)

## 2019-08-11 MED ORDER — SENNOSIDES-DOCUSATE SODIUM 8.6-50 MG PO TABS
1.0000 | ORAL_TABLET | Freq: Every day | ORAL | Status: DC
  Administered 2019-08-11: 1 via ORAL
  Filled 2019-08-11: qty 1

## 2019-08-11 MED ORDER — SORBITOL 70 % SOLN
30.0000 mL | Status: AC
  Administered 2019-08-11: 30 mL via ORAL
  Filled 2019-08-11 (×2): qty 30

## 2019-08-11 MED ORDER — POLYETHYLENE GLYCOL 3350 17 G PO PACK
17.0000 g | PACK | Freq: Every day | ORAL | Status: DC
  Administered 2019-08-11 – 2019-08-12 (×2): 17 g via ORAL
  Filled 2019-08-11 (×2): qty 1

## 2019-08-11 MED ORDER — BISACODYL 10 MG RE SUPP
10.0000 mg | Freq: Once | RECTAL | Status: AC
  Administered 2019-08-11: 10 mg via RECTAL
  Filled 2019-08-11: qty 1

## 2019-08-11 MED ORDER — ONDANSETRON HCL 4 MG/2ML IJ SOLN
4.0000 mg | Freq: Three times a day (TID) | INTRAMUSCULAR | Status: AC
  Administered 2019-08-11 – 2019-08-12 (×3): 4 mg via INTRAVENOUS
  Filled 2019-08-11 (×3): qty 2

## 2019-08-11 NOTE — Progress Notes (Signed)
Physical Therapy Treatment Patient Details Name: [name] MRN: [id number] DOB: December 17, 1945 Today's Date: 08/11/2019    History of Present Illness Patient is a 73 year old female with history of coronary artery disease, status post cardiac cath with nonobstructive lesions in 8/18,history of hypertension, hyperlipidemia admitted with chest pain positive eleveated BNP and reduced EF.  Work up with serial HS troponins thought not ACS.  Found to be covid positive.    PT Comments    Pt lying in bed with c/o of abdominal pain from constipation. Agreeable to walk with therapy to aid in bowel movement. Pt is mod I for bed mobility, supervision for transfers and hands on min guard for ambulation of 40 feet in room with RW. D/c plans remain appropriate. PT will continue to follow acutely.    Follow Up Recommendations  Home health PT;Supervision/Assistance - 24 hour     Equipment Recommendations  None recommended by PT    Recommendations for Other Services       Precautions / Restrictions Precautions Precautions: None Restrictions Weight Bearing Restrictions: No    Mobility  Bed Mobility Overal bed mobility: Modified Independent             General bed mobility comments: supine to sit and sit to supine  Transfers Overall transfer level: Needs assistance Equipment used: Rolling walker (2 wheeled) Transfers: Sit to/from Stand Sit to Stand: Supervision         General transfer comment: supervision for safety  Ambulation/Gait Ambulation/Gait assistance: Min guard Gait Distance (Feet): 40 Feet Assistive device: Rolling walker (2 wheeled) Gait Pattern/deviations: Step-through pattern;Decreased step length - right;Decreased step length - left;Shuffle;Narrow base of support;Trunk flexed Gait velocity: slowed Gait velocity interpretation: <1.31 ft/sec, indicative of household ambulator General Gait Details: hands on min guard for mild instability, no overt LoB, vc for wider  BoS with ambulation       Balance Overall balance assessment: Needs assistance   Sitting balance-Leahy Scale: Good Sitting balance - Comments: sat EOB about 10 minutes trying to help nausea eating crackers and drinking ginger ale   Standing balance support: No upper extremity supported;During functional activity;Bilateral upper extremity supported Standing balance-Leahy Scale: Fair Standing balance comment: needs UE support for dynamic balance                            Cognition Arousal/Alertness: Awake/alert Behavior During Therapy: Flat affect Overall Cognitive Status: Within Functional Limits for tasks assessed                                        Exercises      General Comments General comments (skin integrity, edema, etc.): VSS,       Pertinent Vitals/Pain Pain Assessment: Faces Faces Pain Scale: Hurts a little bit Pain Location: abdomen due to constipation Pain Descriptors / Indicators: Aching;Contraction Pain Intervention(s): Limited activity within patient's tolerance;Monitored during session;Repositioned    Home Living                      Prior Function            PT Goals (current goals can now be found in the care plan section) Acute Rehab PT Goals Patient Stated Goal: to feel better PT Goal Formulation: With patient Time For Goal Achievement: 08/22/19 Potential to Achieve Goals: Good Progress towards PT goals: Progressing toward  goals    Frequency    Min 3X/week      PT Plan Current plan remains appropriate       AM-PAC PT "6 Clicks" Mobility   Outcome Measure  Help needed turning from your back to your side while in a flat bed without using bedrails?: None Help needed moving from lying on your back to sitting on the side of a flat bed without using bedrails?: None Help needed moving to and from a bed to a chair (including a wheelchair)?: A Little Help needed standing up from a chair using your  arms (e.g., wheelchair or bedside chair)?: A Little Help needed to walk in hospital room?: A Little Help needed climbing 3-5 steps with a railing? : A Little 6 Click Score: 20    End of Session   Activity Tolerance: Patient tolerated treatment well(nausea) Patient left: in bed;with call bell/phone within reach Nurse Communication: Mobility status PT Visit Diagnosis: Muscle weakness (generalized) (M62.81);Other abnormalities of gait and mobility (R26.89)     Time: 3299-2426 PT Time Calculation (min) (ACUTE ONLY): 26 min  Charges:  $Gait Training: 8-22 mins $Therapeutic Activity: 8-22 mins                     Mellisa Arshad B. Beverely Risen PT, DPT Acute Rehabilitation Services Pager [phone] Office [phone]    Elon Alas Fleet 08/11/2019, 5:46 PM

## 2019-08-11 NOTE — Progress Notes (Signed)
PROGRESS NOTE    Katherine Roszak  LKG:[id number] DOB: 1946-03-11 DOA: 08/05/2019 PCP: Shelva Majestic, MD    Brief Narrative:  Patient is a 73 year old female with history of coronary artery disease, status post cardiac cath with nonobstructive lesions in 8/18,history of hypertension, hyperlipidemia who presents to the emergency department with complaints of tightness in the left chest radiating to the left arm, worsening shortness of breath. She reported history of exposure to COVID about 2 weeks ago but tested negative for COVID about a week ago. She complained of general weakness on presentation. On presentation, her BNP was elevated 2000 and had mild elevated troponins. EKG showed ST depression in the inferolateral leads. Cardiology consulted. Echo showed reduced EF.She is currently COVID symptom-free except for generalized weakness and is saturating fine on room air. There is no plan for intervention by cardiology for now.  She will be followed by cardiology as an outpatient.  Medication adjusted.  Currently she is hemodynamically stable for discharge.  Respiratory status has been stable since admission.  She was seen by physical therapy and recommended home health on discharge.   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  Chest x-ray done was negative for pneumonia effusion or pulmonary edema.  Patient received a dose of Lasix with some clinical improvement.  2D echo showed a EF of 35 to 40%, moderately decreased left ventricular function, pseudonormalization pattern of LV  diastolic filling, severely dilated left atrium.  2D echo from December 2018 had a EF of 50 to 55%.  Patient seen in consultation by cardiology recommended continued cardiac medications of Norvasc, Lipitor, metoprolol succinate.  Outpatient follow-up with cardiology.  2  COVID-19 infection Patient presented with shortness of breath which felt could be from possible congestive heart failure.  Patient denies any shortness of breath today.  Denies any ongoing chest pain.  Chest x-ray with no acute infiltrate.  Patient with sats of 99 percent on room air.  Inflammatory markers elevated.  Continue Decadron to 4 mg daily x 3 days, then 2 mg daily x3 days and then stop. Patient and family hesitant to be discharged with COVID-19 infection however patient not hypoxic and denies any ongoing shortness of breath.  Will need outpatient follow-up with PCP.  3.  Chest pain Patient noted with history of coronary vasospasm.  On admission troponin was noted to be slightly elevated however seem to have plateaued.  Patient seen in consultation by cardiology who feel this is consistent with demand ischemia rather than acute coronary syndrome.  Patient also noted to have some pronounced ST depression in inferior leads on 08/05/2019.  Nitropaste ordered however was stopped due to hypotension.  Will discontinue Nitropaste of MAR.  Patient known noted to have decreased EF.  Patient started on amlodipine which was increased to 5 mg daily.  Cardiology.  Continue aspirin 81 mg daily.  Patient placed on Toprol-XL and Lipitor.  Increase Toprol-XL to 37.5 mg daily due to nonsustained V. tach.  Outpatient follow-up with cardiology.  4.  Acute kidney injury Resolved.  5.  History of coronary artery disease Patient  seen by cardiology.  No further invasive cardiac work-up needed at this time.  Outpatient follow-up.  6.  Left upper lobe nodule Noted on chest x-ray which was a rounded 1.1 cm nodular density projecting within left upper  lobe.  outpatient CT for follow-up which can be done after resolution of patient's COVID.  7.  Hyperthyroidism Patient with history of Graves' disease/multinodular goiter.  Continue methimazole.  Propranolol changed to metoprolol per cardiology.  Increased Toprol-XL to 37.5 mg daily.  Outpatient follow-up with endocrinology.  8.  Hypertension Continue Toprol-XL and amlodipine.  Follow.   9.  Gastroesophageal reflux disease Continue PPI.  10.  Mild thrombocytopenia Likely secondary to viral illness.  Slowly improving.  Follow.  11.  Asymptomatic nonsustained V. tach Palpation patient with asymptomatic nonsustained V. tach early in the morning of 08/10/2019.  Magnesium was at 1.9 and repleted currently at 2.2.  Potassium of 4.4.  Toprol-XL dose increased to 37.5 mg daily.  No further episodes noted. Outpatient follow-up with cardiology.  12.  Constipation Sorbitol x2.  Dulcolax suppository x1.  Place on MiraLAX daily and Senokot-S nightly.  13 nausea Questionable etiology.  May be secondary to constipation.  Place on scheduled Zofran for the next 24 hours.  Supportive care.    DVT prophylaxis: Lovenox Code Status: Full Family Communication: Updated patient. Updated daughter on phone. Disposition Plan: Hopefully home tomorrow if nausea improved constipation resolved and clinical improvement.     Consultants:   : Dr. Purvis Sheffield 08/05/2019  Procedures:  Chest x-ray 08/05/2019, 08/07/2019  2D echo 08/07/2019  Antimicrobials:  None   Subjective: C/o nausea earlier on this morning since improved. Some c/o sore throat last night and some intermittent SOB and some coughing.  Per RN patient complain of constipation.   Objective: Vitals:   08/10/19 0800 08/10/19 1621 08/11/19 0001 08/11/19 0808  BP: 131/85 128/78 122/79 132/82  Pulse: 88 77 66 77  Resp:      Temp: 98.7 F (37.1 C) 98.5 F (36.9 C) 98.9 F (37.2 C) 99.1 F (37.3 C)  TempSrc: Oral Oral Oral Oral  SpO2:  99% 99% 100% 99%  Weight:      Height:        Intake/Output Summary (Last 24 hours) at 08/11/2019 1026 Last data filed at 08/11/2019 0859 Gross per 24 hour  Intake 1423 ml  Output -  Net 1423 ml   Filed Weights   08/05/19 1306 08/06/19 2216  Weight: 61.2 kg 60.4 kg    Examination:  General exam: NAD Respiratory system: CTAB. No wheezes, no crackles, no rhonchi.  Normal respiratory effort.   Cardiovascular system: RRR no murmurs rubs or gallops.  No JVD.  No lower extremity edema.  Gastrointestinal system: Abdomen is nontender, nondistended, soft, positive bowel sounds.  No rebound.  No guarding.  Central nervous system: Alert and oriented. No focal neurological deficits. Extremities: Symmetric 5 x 5 power. Skin: No rashes, lesions or ulcers Psychiatry: Judgement and insight appear normal. Mood & affect appropriate.     Data Reviewed: I have personally reviewed following labs and imaging studies  CBC: Recent Labs  Lab 08/07/19 0529 08/08/19 0442 08/09/19 0942 08/10/19 0459 08/11/19 0522  WBC 3.5* 3.3* 3.3* 2.5* 5.6  NEUTROABS 2.5 1.9 2.5  --  4.2  HGB 13.2 12.9 12.9 13.5 13.5  HCT 39.0 38.6 39.6 41.3 40.2  MCV 95.6 95.3 95.9 95.6 94.4  PLT 104* 112* 127* 134* 142*   Basic Metabolic Panel: Recent Labs  Lab 08/05/19 1114  08/07/19 0529 08/08/19 0442 08/09/19 0722 08/09/19 0942 08/10/19 0459 08/11/19 0522  NA  --    < > 134* 135  --  135 135 136  K  --    < > 4.3 3.9  --  3.7 4.6 4.4  CL  --    < > 103 105  --  104 101 103  CO2  --    < > 19* 22  --  23 24 25   GLUCOSE  --    < > 127* 94  --  150* 129* 112*  BUN  --    < > 51* 31*  --  15 18 22   CREATININE  --    < > 2.65* 1.17*  --  1.04* 1.30* 1.03*  CALCIUM  --    < > 8.5* 8.2*  --  8.3* 8.8* 8.7*  MG 1.9  --  2.0 1.9 1.8  --  1.9 2.2  PHOS 3.9  --   --   --   --   --   --   --    < > = values in this interval not displayed.   GFR: Estimated Creatinine Clearance: 46.2 mL/min (A) (by C-G formula  based on SCr of 1.03 mg/dL (H)). Liver Function Tests: Recent Labs  Lab 08/05/19 1114 08/07/19 0529 08/08/19 0442  AST 25 27 25   ALT 24 16 17   ALKPHOS 77 58 48  BILITOT 1.4* 0.9 0.6  PROT 8.2* 6.8 6.4*  ALBUMIN 3.9 3.3* 2.9*   Recent Labs  Lab 08/05/19 1114  LIPASE 25   No results for input(s): AMMONIA in the last 168 hours. Coagulation Profile: Recent Labs  Lab 08/05/19 1114  INR 1.2   Cardiac Enzymes: No results for input(s): CKTOTAL, CKMB, CKMBINDEX, TROPONINI in the last 168 hours. BNP (last 3 results) No results for input(s): PROBNP in the last 8760 hours. HbA1C: No results for input(s): HGBA1C in the last 72 hours. CBG: Recent Labs  Lab 08/05/19 1402  GLUCAP 100*   Lipid Profile: No results for input(s): CHOL, HDL, LDLCALC, TRIG, CHOLHDL, LDLDIRECT in the last 72 hours. Thyroid Function Tests: No results for input(s): TSH, T4TOTAL, FREET4, T3FREE, THYROIDAB in the last 72 hours. Anemia Panel: Recent Labs    08/10/19 0459 08/11/19 0522  FERRITIN 716* 845*   Sepsis Labs: Recent Labs  Lab 08/05/19 1114 08/07/19 0529 08/07/19 0816  LATICACIDVEN 1.8 1.2 0.9    Recent Results (from the past 240 hour(s))  SARS CORONAVIRUS 2 (TAT 6-24 HRS) Nasopharyngeal Nasopharyngeal Swab     Status: Abnormal   Collection Time: 08/05/19 12:59 PM   Specimen: Nasopharyngeal Swab  Result Value Ref Range Status   SARS Coronavirus 2 POSITIVE (A) NEGATIVE Final    Comment: RESULT CALLED TO, READ BACK BY AND VERIFIED WITH: B OSORIO,RN AT 2016 08/05/2019 BY L BENFIELD (NOTE) SARS-CoV-2 target nucleic acids are DETECTED. The SARS-CoV-2 RNA is generally detectable in upper and lower respiratory specimens during the acute phase of infection. Positive results are indicative of active infection with SARS-CoV-2. Clinical  correlation with patient history and other diagnostic information is necessary to determine patient infection status. Positive results do  not rule out  bacterial infection or co-infection with other viruses. The expected result is Negative. Fact Sheet for Patients: HairSlick.nohttps://www.fda.gov/media/138098/download Fact Sheet for Healthcare Providers: quierodirigir.comhttps://www.fda.gov/media/138095/download This test is not yet approved or cleared by the [place] FDA and  has been authorized for detection and/or diagnosis of SARS-CoV-2 by FDA under an  Emergency Use Authorization (EUA). This EUA will remain  in effect (meaning this test can be [place] ed) for the duration of the COVID-19 declaration under Section 564(b)(1) of the Act, 21 U.S.C. section 360bbb-3(b)(1), unless the authorization is terminated or revoked sooner. Performed at [facility], [place] [address], [place], [place] [address]   MRSA PCR Screening     Status: None   Collection Time: 08/06/19  1:04 PM   Specimen: Nasopharyngeal  Result Value Ref Range Status   MRSA by PCR NEGATIVE NEGATIVE Final    Comment:        The GeneXpert MRSA Assay (FDA approved for NASAL specimens only), is one component of a comprehensive MRSA colonization surveillance program. It is not intended to diagnose MRSA infection nor to guide or monitor treatment for MRSA infections. Performed at [facility], [place] [address], [place], [place] [address]          Radiology Studies: No results found.      Scheduled Meds: . amLODipine  2.5 mg Oral Daily  . aspirin  325 mg Oral QODAY  . atorvastatin  20 mg Oral Daily  . dexamethasone  4 mg Oral Daily  . enoxaparin (LOVENOX) injection  40 mg Subcutaneous Daily  . ezetimibe  10 mg Oral Daily  . ferrous sulfate  325 mg Oral Q breakfast  . influenza vaccine adjuvanted  0.5 mL Intramuscular Tomorrow-1000  . methimazole  40 mg Oral BID  . metoprolol succinate  37.5 mg Oral Daily  . oxybutynin  10 mg Oral Daily  . pantoprazole  40 mg Oral Daily  . sodium chloride flush  3 mL Intravenous Q12H  . venlafaxine XR  150 mg Oral Q breakfast  . vitamin  C  500 mg Oral BID  . zinc sulfate  220 mg Oral Daily   Continuous Infusions: . sodium chloride       LOS: 4 days    Time spent: 35 minutes    Irine Seal, MD [facility]  If 7PM-7AM, please contact night-coverage [email or web address] 08/11/2019, 10:26 AM

## 2019-08-12 LAB — BASIC METABOLIC PANEL
Anion gap: 10 (ref 5–15)
BUN: 19 mg/dL (ref 8–23)
CO2: 23 mmol/L (ref 22–32)
Calcium: 8.7 mg/dL — ABNORMAL LOW (ref 8.9–10.3)
Chloride: 102 mmol/L (ref 98–111)
Creatinine, Ser: 1.09 mg/dL — ABNORMAL HIGH (ref 0.44–1.00)
GFR calc Af Amer: 59 mL/min — ABNORMAL LOW (ref >60)
GFR calc non Af Amer: 51 mL/min — ABNORMAL LOW (ref >60)
Glucose, Bld: 88 mg/dL (ref 70–99)
Potassium: 4.2 mmol/L (ref 3.5–5.1)
Sodium: 135 mmol/L (ref 135–145)

## 2019-08-12 LAB — LACTATE DEHYDROGENASE: LDH: 224 U/L — ABNORMAL HIGH (ref 98–192)

## 2019-08-12 LAB — C-REACTIVE PROTEIN: CRP: <0.8 mg/dL (ref <1.0)

## 2019-08-12 LAB — MAGNESIUM: Magnesium: 2 mg/dL (ref 1.7–2.4)

## 2019-08-12 LAB — D-DIMER, QUANTITATIVE: D-Dimer, Quant: 0.29 ug/mL-FEU (ref 0.00–0.50)

## 2019-08-12 LAB — FERRITIN: Ferritin: 607 ng/mL — ABNORMAL HIGH (ref 11–307)

## 2019-08-12 MED ORDER — LEVALBUTEROL TARTRATE 45 MCG/ACT IN AERO
2.0000 | INHALATION_SPRAY | Freq: Three times a day (TID) | RESPIRATORY_TRACT | Status: DC
  Administered 2019-08-12 – 2019-08-14 (×6): 2 via RESPIRATORY_TRACT
  Filled 2019-08-12: qty 15

## 2019-08-12 MED ORDER — IPRATROPIUM BROMIDE HFA 17 MCG/ACT IN AERS
2.0000 | INHALATION_SPRAY | Freq: Three times a day (TID) | RESPIRATORY_TRACT | Status: DC
  Administered 2019-08-12 – 2019-08-14 (×6): 2 via RESPIRATORY_TRACT
  Filled 2019-08-12: qty 12.9

## 2019-08-12 MED ORDER — MAGNESIUM HYDROXIDE 400 MG/5ML PO SUSP: 15.0000 mL | Freq: Once | ORAL | Status: DC

## 2019-08-12 MED ORDER — POLYETHYLENE GLYCOL 3350 17 G PO PACK
17.0000 g | PACK | Freq: Two times a day (BID) | ORAL | Status: DC
  Administered 2019-08-12 – 2019-08-13 (×3): 17 g via ORAL
  Filled 2019-08-12 (×4): qty 1

## 2019-08-12 MED ORDER — GUAIFENESIN ER 600 MG PO TB12
1200.0000 mg | ORAL_TABLET | Freq: Two times a day (BID) | ORAL | Status: AC
  Administered 2019-08-12 – 2019-08-14 (×5): 1200 mg via ORAL
  Filled 2019-08-12 (×5): qty 2

## 2019-08-12 MED ORDER — BISACODYL 10 MG RE SUPP
10.0000 mg | Freq: Once | RECTAL | Status: AC
  Administered 2019-08-12: 10 mg via RECTAL
  Filled 2019-08-12: qty 1

## 2019-08-12 MED ORDER — VITAMIN D 25 MCG (1000 UNIT) PO TABS
1000.0000 [IU] | ORAL_TABLET | Freq: Every day | ORAL | Status: DC
  Administered 2019-08-12 – 2019-08-14 (×3): 1000 [IU] via ORAL
  Filled 2019-08-12 (×3): qty 1

## 2019-08-12 MED ORDER — SENNOSIDES-DOCUSATE SODIUM 8.6-50 MG PO TABS
2.0000 | ORAL_TABLET | Freq: Two times a day (BID) | ORAL | Status: DC
  Administered 2019-08-12 – 2019-08-13 (×4): 2 via ORAL
  Filled 2019-08-12 (×5): qty 2

## 2019-08-12 MED ORDER — FOSFOMYCIN TROMETHAMINE 3 G PO PACK
3.0000 g | PACK | Freq: Once | ORAL | Status: DC
  Filled 2019-08-12: qty 3

## 2019-08-12 NOTE — Progress Notes (Signed)
Daughter, Stanton Kidney called asking for update re: patient's status. Received permission from patient. Returned call to daughter with updates.

## 2019-08-12 NOTE — Progress Notes (Addendum)
PROGRESS NOTE  [name] FXT:[id number] DOB: [birth year] DOA: 08/05/2019 PCP: Shelva Majestic, MD  HPI/Recap of past 24 hours: Patient is a 73 year old female with history of coronary artery disease, status post cardiac cath with nonobstructive lesions in 8/18,history of hypertension, hyperlipidemia who presents to the emergency department with complaints of tightness in the left chest radiating to the left arm, worsening shortness of breath. She reported history of exposure to COVID about 2 weeks ago but tested negative for COVID about a week ago. She complained of general weakness on presentation. On presentation, her BNP was elevated 2000 and had mild elevated troponins. EKG showed ST depression in the inferolateral leads. Cardiology consulted. Echo showed reduced EF.She is currently COVID symptom-free except for generalized weakness and is saturating fine on room air. There is no plan for intervention by cardiology for now. She will be followed by cardiology as an outpatient. Medication adjusted. Currently she is hemodynamically stable for discharge. Respiratory status has been stable since admission. She was seen by physical therapy and recommended home health on discharge.  08/12/19: Patient was seen and examined at her bedside this morning.  No acute events overnight.  She denies any chest pain.  Conversational dyspnea noted.  Intermittent nonproductive cough.  States she still has not had a bowel movement and is nauseated although improved with antiemetics.  States she no longer has dysuria and denies polyuria.  No abdominal pain.  Assessment/Plan: Principal Problem:   Chest pain Active Problems:   HTN (hypertension)   Coronary artery disease involving native coronary artery of native heart with angina pectoris (HCC)   Hyperthyroidism   Demand ischemia (HCC)   COVID-19 virus infection   COVID-19   AKI (acute kidney injury) (HCC)  Chest pain with history of coronary  vasospasm Presented with elevated troponin S, peaked at 86 and trended down Seen by cardiology no procedures planned TTE done during this admission revealed LVEF 35 to 40% with severe left atrial enlargement and grade 2 diastolic dysfunction Denies anginal symptoms at the time of this visit Continue cardiac medications as recommended by cardiology Continue to closely monitor on telemetry  Covid-19 viral infection Prior exposure to COVID-19 2 weeks prior to presentation Covid-19 test positive on 08/05/2019 Inflammatory markers elevated Personally reviewed chest x-ray done on 08/07/2019, showing no lobular infiltrates Conversational dyspnea noted on exam Minimal nonproductive cough Low-grade temperature with T-max 99.1 this morning O2 saturation 98% on room air Continue Decadron 4 mg daily Continue zinc, vitamin C supplement Add vitamin D3 Xopenex 2 puffs and Atrovent 2 puffs inhaler 3 times daily  Asymptomatic pyuria UA done on 08/11/2019 showed WBC UA 21-50 with negative leukocyte and negative nitrite Urine culture in process Continue to hold off antibiotics  Acute on chronic combined systolic and diastolic CHF Presented with elevated BNP greater than 2000 2D echo done on 08/07/2019 showed reduced LVEF 35 to 40% Continue strict I's and O's and daily weight  Physical debility/ambulatory dysfunction PT assessed and recommended home health PT with 24-hour supervision  Resolving AKI Presented with creatinine of 1.30 with GFR 47 Baseline creatinine of 1 and GFR greater than 60 Continue to avoid nephrotoxins Monitor urine output  Chronic constipation associated with mild intractable nausea Currently on bowel regimen On MiraLAX 17 g daily and Senokot nightly Increase MiraLAX twice daily and Senokot twice daily Add Dulcolax suppository Continue antiemetics as needed   Hyperthyroidism Patient with history of Graves' disease/multinodular goiter.  Continue methimazole.   Propranolol changed to metoprolol per  cardiology.  Increased Toprol-XL to 37.5 mg daily.  Outpatient follow-up with endocrinology.  Hypertension Continue Toprol-XL and amlodipine.  Follow.   Gastroesophageal reflux disease Continue PPI.  Mild thrombocytopenia Likely secondary to viral illness.   Continue to follow  DVT prophylaxis: Lovenox subcu daily Code Status: Full Family Communication:  None at bedside.   Disposition Plan:  Patient is currently not appropriate for discharge due to persistent symptomatology.  Possible discharge tomorrow on 08/13/2019 when constipation and nausea have improved.   Consultants:   : Dr. Bronson Ing 08/05/2019  Procedures:  Chest x-ray 08/05/2019, 08/07/2019  2D echo 08/07/2019  Antimicrobials: None.   Objective: Vitals:   08/11/19 0808 08/11/19 1731 08/11/19 2234 08/12/19 0808  BP: 132/82 107/62 117/70 136/70  Pulse: 77 (!) 56 64 63  Resp:   14 20  Temp: 99.1 F (37.3 C) 98.3 F (36.8 C) 97.8 F (36.6 C) 99.1 F (37.3 C)  TempSrc: Oral Oral Oral Oral  SpO2: 99% 99% 99% 98%  Weight:      Height:        Intake/Output Summary (Last 24 hours) at 08/12/2019 0931 Last data filed at 08/11/2019 2045 Gross per 24 hour  Intake 3 ml  Output 200 ml  Net -197 ml   Filed Weights   08/05/19 1306 08/06/19 2216  Weight: 61.2 kg 60.4 kg    Exam:  . General: 73 y.o. year-old female well developed well nourished in no acute distress.  Alert and oriented x3. . Cardiovascular: Regular rate and rhythm with no rubs or gallops.  No thyromegaly or JVD noted.   Marland Kitchen Respiratory: Clear to auscultation with no wheezes or rales. Good inspiratory effort.  Faint diffuse wheezing. . Abdomen: Soft nontender nondistended with normal bowel sounds x4 quadrants. . Musculoskeletal: No lower extremity edema. 2/4 pulses in all 4 extremities. Marland Kitchen Psychiatry: Mood is appropriate for condition and setting   Data Reviewed: CBC: Recent Labs  Lab  08/07/19 0529 08/08/19 0442 08/09/19 0942 08/10/19 0459 08/11/19 0522  WBC 3.5* 3.3* 3.3* 2.5* 5.6  NEUTROABS 2.5 1.9 2.5  --  4.2  HGB 13.2 12.9 12.9 13.5 13.5  HCT 39.0 38.6 39.6 41.3 40.2  MCV 95.6 95.3 95.9 95.6 94.4  PLT 104* 112* 127* 134* 119*   Basic Metabolic Panel: Recent Labs  Lab 08/05/19 1114  08/08/19 0442 08/09/19 0722 08/09/19 0942 08/10/19 0459 08/11/19 0522 08/12/19 0651  NA  --    < > 135  --  135 135 136 135  K  --    < > 3.9  --  3.7 4.6 4.4 4.2  CL  --    < > 105  --  104 101 103 102  CO2  --    < > 22  --  23 24 25 23   GLUCOSE  --    < > 94  --  150* 129* 112* 88  BUN  --    < > 31*  --  15 18 22 19   CREATININE  --    < > 1.17*  --  1.04* 1.30* 1.03* 1.09*  CALCIUM  --    < > 8.2*  --  8.3* 8.8* 8.7* 8.7*  MG 1.9   < > 1.9 1.8  --  1.9 2.2 2.0  PHOS 3.9  --   --   --   --   --   --   --    < > = values in this interval not displayed.   GFR:  Estimated Creatinine Clearance: 43.7 mL/min (A) (by C-G formula based on SCr of 1.09 mg/dL (H)). Liver Function Tests: Recent Labs  Lab 08/05/19 1114 08/07/19 0529 08/08/19 0442  AST 25 27 25   ALT 24 16 17   ALKPHOS 77 58 48  BILITOT 1.4* 0.9 0.6  PROT 8.2* 6.8 6.4*  ALBUMIN 3.9 3.3* 2.9*   Recent Labs  Lab 08/05/19 1114  LIPASE 25   No results for input(s): AMMONIA in the last 168 hours. Coagulation Profile: Recent Labs  Lab 08/05/19 1114  INR 1.2   Cardiac Enzymes: No results for input(s): CKTOTAL, CKMB, CKMBINDEX, TROPONINI in the last 168 hours. BNP (last 3 results) No results for input(s): PROBNP in the last 8760 hours. HbA1C: No results for input(s): HGBA1C in the last 72 hours. CBG: Recent Labs  Lab 08/05/19 1402  GLUCAP 100*   Lipid Profile: No results for input(s): CHOL, HDL, LDLCALC, TRIG, CHOLHDL, LDLDIRECT in the last 72 hours. Thyroid Function Tests: No results for input(s): TSH, T4TOTAL, FREET4, T3FREE, THYROIDAB in the last 72 hours. Anemia Panel: Recent Labs     08/11/19 0522 08/12/19 0651  FERRITIN 845* 607*   Urine analysis:    Component Value Date/Time   COLORURINE AMBER (A) 08/11/2019 1619   APPEARANCEUR CLOUDY (A) 08/11/2019 1619   LABSPEC 1.024 08/11/2019 1619   PHURINE 5.0 08/11/2019 1619   GLUCOSEU NEGATIVE 08/11/2019 1619   HGBUR NEGATIVE 08/11/2019 1619   BILIRUBINUR NEGATIVE 08/11/2019 1619   BILIRUBINUR Negative 01/20/2018 1214   KETONESUR NEGATIVE 08/11/2019 1619   PROTEINUR 100 (A) 08/11/2019 1619   UROBILINOGEN 1.0 01/20/2018 1214   UROBILINOGEN 1.0 05/17/2014 0342   NITRITE NEGATIVE 08/11/2019 1619   LEUKOCYTESUR NEGATIVE 08/11/2019 1619   Sepsis Labs: @LABRCNTIP (procalcitonin:4,lacticidven:4)  ) Recent Results (from the past 240 hour(s))  SARS CORONAVIRUS 2 (TAT 6-24 HRS) Nasopharyngeal Nasopharyngeal Swab     Status: Abnormal   Collection Time: 08/05/19 12:59 PM   Specimen: Nasopharyngeal Swab  Result Value Ref Range Status   SARS Coronavirus 2 POSITIVE (A) NEGATIVE Final    Comment: RESULT CALLED TO, READ BACK BY AND VERIFIED WITH: B OSORIO,RN AT 2016 08/05/2019 BY L BENFIELD (NOTE) SARS-CoV-2 target nucleic acids are DETECTED. The SARS-CoV-2 RNA is generally detectable in upper and lower respiratory specimens during the acute phase of infection. Positive results are indicative of active infection with SARS-CoV-2. Clinical  correlation with patient history and other diagnostic information is necessary to determine patient infection status. Positive results do  not rule out bacterial infection or co-infection with other viruses. The expected result is Negative. Fact Sheet for Patients: HairSlick.nohttps://www.fda.gov/media/138098/download Fact Sheet for Healthcare Providers: quierodirigir.comhttps://www.fda.gov/media/138095/download This test is not yet approved or cleared by the [place] FDA and  has been authorized for detection and/or diagnosis of SARS-CoV-2 by FDA under an Emergency Use Authorization (EUA). This EUA will  remain  in effect (meaning this test can be [place] ed) for the duration of the COVID-19 declaration under Section 564(b)(1) of the Act, 21 U.S.C. section 360bbb-3(b)(1), unless the authorization is terminated or revoked sooner. Performed at [facility], [address] [place], [place] [address]   MRSA PCR Screening     Status: None   Collection Time: 08/06/19  1:04 PM   Specimen: Nasopharyngeal  Result Value Ref Range Status   MRSA by PCR NEGATIVE NEGATIVE Final    Comment:        The GeneXpert MRSA Assay (FDA approved for NASAL specimens only), is one component of  a comprehensive MRSA colonization surveillance program. It is not intended to diagnose MRSA infection nor to guide or monitor treatment for MRSA infections. Performed at [facility], [address] [place], [place] [address]       Studies: No results found.  Scheduled Meds: . amLODipine  2.5 mg Oral Daily  . aspirin  325 mg Oral QODAY  . atorvastatin  20 mg Oral Daily  . dexamethasone  4 mg Oral Daily  . enoxaparin (LOVENOX) injection  40 mg Subcutaneous Daily  . ezetimibe  10 mg Oral Daily  . ferrous sulfate  325 mg Oral Q breakfast  . influenza vaccine adjuvanted  0.5 mL Intramuscular Tomorrow-1000  . methimazole  40 mg Oral BID  . metoprolol succinate  37.5 mg Oral Daily  . oxybutynin  10 mg Oral Daily  . pantoprazole  40 mg Oral Daily  . polyethylene glycol  17 g Oral Daily  . senna-docusate  1 tablet Oral QHS  . sodium chloride flush  3 mL Intravenous Q12H  . venlafaxine XR  150 mg Oral Q breakfast  . vitamin C  500 mg Oral BID  . zinc sulfate  220 mg Oral Daily    Continuous Infusions: . sodium chloride       LOS: 5 days     Darlin Drop, MD [facility] Pager [phone]  If 7PM-7AM, please contact night-coverage [email or web address] Password [email or web address] 08/12/2019, 9:31 AM

## 2019-08-12 NOTE — Progress Notes (Signed)
Noted temp 99.2. Noted UA results. MD paged. New orders given

## 2019-08-12 NOTE — Discharge Instructions (Signed)
Person Under Monitoring Name: [name]  Location: [place] [address]   Infection Prevention Recommendations for Individuals Confirmed to have, or Being Evaluated for, 2019 Novel Coronavirus (COVID-19) Infection Who Receive Care at Home  Individuals who are confirmed to have, or are being evaluated for, COVID-19 should follow the prevention steps below until a healthcare provider or local or state health department says they can return to normal activities.  Stay home except to get medical care You should restrict activities outside your home, except for getting medical care. Do not go to work, school, or public areas, and do not use public transportation or taxis.  Call ahead before visiting your doctor Before your medical appointment, call the healthcare provider and tell them that you have, or are being evaluated for, COVID-19 infection. This will help the healthcare providers office take steps to keep other people from getting infected. Ask your healthcare provider to call the local or state health department.  Monitor your symptoms Seek prompt medical attention if your illness is worsening (e.g., difficulty breathing). Before going to your medical appointment, call the healthcare provider and tell them that you have, or are being evaluated for, COVID-19 infection. Ask your healthcare provider to call the local or state health department.  Wear a facemask You should wear a facemask that covers your nose and mouth when you are in the same room with other people and when you visit a healthcare provider. People who live with or visit you should also wear a facemask while they are in the same room with you.  Separate yourself from other people in your home As much as possible, you should stay in a different room from other people in your home. Also, you should use a separate bathroom, if available.  Avoid sharing household items You should not share  dishes, drinking glasses, cups, eating utensils, towels, bedding, or other items with other people in your home. After using these items, you should wash them thoroughly with soap and water.  Cover your coughs and sneezes Cover your mouth and nose with a tissue when you cough or sneeze, or you can cough or sneeze into your sleeve. Throw used tissues in a lined trash can, and immediately wash your hands with soap and water for at least 20 seconds or use an alcohol-based hand rub.  Wash your [facility] your hands often and thoroughly with soap and water for at least 20 seconds. You can use an alcohol-based hand sanitizer if soap and water are not available and if your hands are not visibly dirty. Avoid touching your eyes, nose, and mouth with unwashed hands.   Prevention Steps for Caregivers and Household Members of Individuals Confirmed to have, or Being Evaluated for, COVID-19 Infection Being Cared for in the Home  If you live with, or provide care at home for, a person confirmed to have, or being evaluated for, COVID-19 infection please follow these guidelines to prevent infection:  Follow healthcare providers instructions Make sure that you understand and can help the patient follow any healthcare provider instructions for all care.  Provide for the patients basic needs You should help the patient with basic needs in the home and provide support for getting groceries, prescriptions, and other personal needs.  Monitor the patients symptoms If they are getting sicker, call his or her medical provider and tell them that the patient has, or is being evaluated for, COVID-19 infection. This will help the healthcare providers office take  steps to keep other people from getting infected. Ask the healthcare provider to call the local or state health department.  Limit the number of people who have contact with the patient  If possible, have only one caregiver for the patient.  Other  household members should stay in another home or place of residence. If this is not possible, they should stay  in another room, or be separated from the patient as much as possible. Use a separate bathroom, if available.  Restrict visitors who do not have an essential need to be in the home.  Keep older adults, very young children, and other sick people away from the patient Keep older adults, very young children, and those who have compromised immune systems or chronic health conditions away from the patient. This includes people with chronic heart, lung, or kidney conditions, diabetes, and cancer.  Ensure good ventilation Make sure that shared spaces in the home have good air flow, such as from an air conditioner or an opened window, weather permitting.  Wash your hands often  Wash your hands often and thoroughly with soap and water for at least 20 seconds. You can use an alcohol based hand sanitizer if soap and water are not available and if your hands are not visibly dirty.  Avoid touching your eyes, nose, and mouth with unwashed hands.  Use disposable paper towels to dry your hands. If not available, use dedicated cloth towels and replace them when they become wet.  Wear a facemask and gloves  Wear a disposable facemask at all times in the room and gloves when you touch or have contact with the patients blood, body fluids, and/or secretions or excretions, such as sweat, saliva, sputum, nasal mucus, vomit, urine, or feces.  Ensure the mask fits over your nose and mouth tightly, and do not touch it during use.  Throw out disposable facemasks and gloves after using them. Do not reuse.  Wash your hands immediately after removing your facemask and gloves.  If your personal clothing becomes contaminated, carefully remove clothing and launder. Wash your hands after handling contaminated clothing.  Place all used disposable facemasks, gloves, and other waste in a lined container before  disposing them with other household waste.  Remove gloves and wash your hands immediately after handling these items.  Do not share dishes, glasses, or other household items with the patient  Avoid sharing household items. You should not share dishes, drinking glasses, cups, eating utensils, towels, bedding, or other items with a patient who is confirmed to have, or being evaluated for, COVID-19 infection.  After the person uses these items, you should wash them thoroughly with soap and water.  Wash laundry thoroughly  Immediately remove and wash clothes or bedding that have blood, body fluids, and/or secretions or excretions, such as sweat, saliva, sputum, nasal mucus, vomit, urine, or feces, on them.  Wear gloves when handling laundry from the patient.  Read and follow directions on labels of laundry or clothing items and detergent. In general, wash and dry with the warmest temperatures recommended on the label.  Clean all areas the individual has used often  Clean all touchable surfaces, such as counters, tabletops, doorknobs, bathroom fixtures, toilets, phones, keyboards, tablets, and bedside tables, every day. Also, clean any surfaces that may have blood, body fluids, and/or secretions or excretions on them.  Wear gloves when cleaning surfaces the patient has come in contact with.  Use a diluted bleach solution (e.g., dilute bleach with 1 part bleach  and 10 parts water) or a household disinfectant with a label that says EPA-registered for coronaviruses. To make a bleach solution at home, add 1 tablespoon of bleach to 1 quart (4 cups) of water. For a larger supply, add  cup of bleach to 1 gallon (16 cups) of water.  Read labels of cleaning products and follow recommendations provided on product labels. Labels contain instructions for safe and effective use of the cleaning product including precautions you should take when applying the product, such as wearing gloves or eye protection  and making sure you have good ventilation during use of the product.  Remove gloves and wash hands immediately after cleaning.  Monitor yourself for signs and symptoms of illness Caregivers and household members are considered close contacts, should monitor their health, and will be asked to limit movement outside of the home to the extent possible. Follow the monitoring steps for close contacts listed on the symptom monitoring form.   ? If you have additional questions, contact your local health department or call the epidemiologist on call at [phone] (available 24/7). ? This guidance is subject to change. For the most up-to-date guidance from [facility], please refer to their website: YouBlogs.pl

## 2019-08-12 NOTE — Progress Notes (Signed)
Received in bed alert and well related, VSS-afebrile, offered no complaints- reminded of fluid restriction and safety precautions to prevent falls when using BSC. Verbalized understanding and agreement with plan of care.

## 2019-08-12 NOTE — Plan of Care (Signed)
?  Problem: Clinical Measurements: ?Goal: Respiratory complications will improve ?Outcome: Progressing ?  ?Problem: Activity: ?Goal: Risk for activity intolerance will decrease ?Outcome: Progressing ?  ?Problem: Nutrition: ?Goal: Adequate nutrition will be maintained ?Outcome: Progressing ?  ?Problem: Coping: ?Goal: Level of anxiety will decrease ?Outcome: Progressing ?  ?

## 2019-08-13 ENCOUNTER — Inpatient Hospital Stay (HOSPITAL_COMMUNITY): Payer: Medicare Other

## 2019-08-13 LAB — C-REACTIVE PROTEIN: CRP: 1.7 mg/dL — ABNORMAL HIGH (ref <1.0)

## 2019-08-13 LAB — URINE CULTURE: Culture: >=100000 — AB

## 2019-08-13 LAB — MAGNESIUM: Magnesium: 2 mg/dL (ref 1.7–2.4)

## 2019-08-13 LAB — D-DIMER, QUANTITATIVE: D-Dimer, Quant: 0.56 ug/mL-FEU — ABNORMAL HIGH (ref 0.00–0.50)

## 2019-08-13 LAB — LACTATE DEHYDROGENASE: LDH: 228 U/L — ABNORMAL HIGH (ref 98–192)

## 2019-08-13 LAB — FERRITIN: Ferritin: 629 ng/mL — ABNORMAL HIGH (ref 11–307)

## 2019-08-13 MED ORDER — MAGNESIUM CITRATE PO SOLN
1.0000 | Freq: Once | ORAL | Status: AC
  Administered 2019-08-13: 1 via ORAL
  Filled 2019-08-13: qty 296

## 2019-08-13 MED ORDER — DEXAMETHASONE 4 MG PO TABS: 4.0000 mg | ORAL_TABLET | Freq: Every day | ORAL | 0 refills | Status: AC

## 2019-08-13 MED ORDER — POLYETHYLENE GLYCOL 3350 17 G PO PACK: 17.0000 g | PACK | Freq: Two times a day (BID) | ORAL | 0 refills | Status: DC

## 2019-08-13 MED ORDER — IPRATROPIUM BROMIDE HFA 17 MCG/ACT IN AERS: 2.0000 | INHALATION_SPRAY | Freq: Three times a day (TID) | RESPIRATORY_TRACT | 12 refills | Status: DC

## 2019-08-13 MED ORDER — INFLUENZA VAC A&B SA ADJ QUAD 0.5 ML IM PRSY: 0.5000 mL | PREFILLED_SYRINGE | INTRAMUSCULAR | 0 refills | Status: AC

## 2019-08-13 MED ORDER — METOPROLOL SUCCINATE ER 25 MG PO TB24: 37.5000 mg | ORAL_TABLET | Freq: Every day | ORAL | 1 refills | Status: DC

## 2019-08-13 MED ORDER — GUAIFENESIN ER 600 MG PO TB12: 1200.0000 mg | ORAL_TABLET | Freq: Two times a day (BID) | ORAL | 0 refills | Status: AC

## 2019-08-13 MED ORDER — LEVALBUTEROL TARTRATE 45 MCG/ACT IN AERO: 2.0000 | INHALATION_SPRAY | Freq: Three times a day (TID) | RESPIRATORY_TRACT | 12 refills | Status: DC

## 2019-08-13 MED ORDER — SENNOSIDES-DOCUSATE SODIUM 8.6-50 MG PO TABS: 2.0000 | ORAL_TABLET | Freq: Every day | ORAL | 0 refills | Status: DC

## 2019-08-13 NOTE — Progress Notes (Signed)
Denies BM, Flatus, Abdominal X-ray done at bed side r/o ileus, Patient resting comfortably abdomen soft wo distention, no nausea or vomiting.

## 2019-08-13 NOTE — Care Management (Signed)
Status check on [employer] website: [email or web address].com/[email or web address]  CM notified Dr Roger Shelter that DC was upheld. MD to assess and determine plan. CM available for assistance if needed.    Overall Status IM Appeal Case: [id number] is complete.  1. Discharge Appeal Started 08/09/2019 00:00:00  2. Medical Record Requested 08/09/2019 10:44:42  3. Documents Received 08/09/2019 00:00:00  4. In Clinical Review 08/09/2019 07:15:02  5. Physician Review Completed 08/11/2019 14:35:09  6. Financial Liability Determined The Physician agreed with the notice. Beneficiary is liable.  7. Verbal Notification Provider: 08/11/2019 14:42:51 Caller: 08/11/2019 14:39:48  8. Case Complete Yes   Carles Collet RN BSN CPN Case Management [phone]

## 2019-08-13 NOTE — Discharge Summary (Signed)
Physician Discharge Summary [facility]    Patient: [name]                   Admit date: 08/05/2019   DOB: [birth year]             Discharge date:08/13/2019/2:05 PM ZOX:[id number]                          PCP: Shelva Majestic, MD  Disposition: HOME   Recommendations for Outpatient Follow-up:   . Follow up: in 1 week  Discharge Condition: Stable   Code Status:   Code Status: Full Code  Diet recommendation: Cardiac diet   Discharge Diagnoses:    Principal Problem:   Chest pain Active Problems:   HTN (hypertension)   Coronary artery disease involving native coronary artery of native heart with angina pectoris (HCC)   Hyperthyroidism   Demand ischemia (HCC)   COVID-19 virus infection   COVID-19   AKI (acute kidney injury) (HCC)   History of Present Illness/ Hospital Course Charline Bills Summary:   HPI/Recap of past 24 hours: Patient is a 73 year old female with history of coronary artery disease, status post cardiac cath with nonobstructive lesions in 8/18,history of hypertension, hyperlipidemia who presents to the emergency department with complaints of tightness in the left chest radiating to the left arm, worsening shortness of breath. She reported history of exposure to COVID about 2 weeks ago but tested negative for COVID about a week ago. She complained of general weakness on presentation. On presentation, her BNP was elevated 2000 and had mild elevated troponins. EKG showed ST depression in the inferolateral leads. Cardiology consulted. Echo showed reduced EF.She is currently COVID symptom-free except for generalized weakness and is saturating fine on room air. There is no plan for intervention by cardiology for now. She will be followed by cardiology as an outpatient. Medication adjusted. Currently she is hemodynamically stable for discharge. Respiratory status has been stable since admission. She was seen by physical therapy and recommended home  health on discharge.  08/12/19: Patient was seen and examined at her bedside this morning.  No acute events overnight.  She denies any chest pain.  Conversational dyspnea noted.  Intermittent nonproductive cough.  States she still has not had a bowel movement and is nauseated although improved with antiemetics.  States she no longer has dysuria and denies polyuria.  No abdominal pain.  08/13/2019 - The patient was seen and examined, remained stable, remained clear for discharge but patient and family unable to pick her up till morning. Bowel regimen to be continued, as oral intake increases. X-ray abdomen pelvis was reviewed negative for any obstruction, positive for constipation As far as cardiopulmonary remained stable, on room air.  Improved shortness of breath.  Called patient's daughter at [place] discussed plan of care and discharge in detail.  She is agreeable to discharge in a.m.    Assessment/Plan: Principal Problem:   Chest pain Active Problems:   HTN (hypertension)   Coronary artery disease involving native coronary artery of native heart with angina pectoris (HCC)   Hyperthyroidism   Demand ischemia (HCC)   COVID-19 virus infection   COVID-19   AKI (acute kidney injury) (HCC)  Chest pain with history of coronary vasospasm -Stable chest pain has resolved Presented with elevated troponin S, peaked at 86 and trended down Seen by cardiology no procedures planned TTE done during this admission revealed LVEF 35 to 40% with severe left atrial  enlargement and grade 2 diastolic dysfunction Denies anginal symptoms at the time of this visit Continue cardiac medications as recommended by cardiology   Covid-19 viral infection -Much improved, on room air -satting greater than 92% Prior exposure to COVID-19 2 weeks prior to presentation Covid-19 test positive on 08/05/2019 Inflammatory markers elevated Personally reviewed chest x-ray done on 08/07/2019, showing no lobular  infiltrates Conversational dyspnea noted on exam Minimal nonproductive cough Low-grade temperature with T-max 99.1 this morning Continue Decadron 4 mg daily Continue zinc, vitamin C supplement Add vitamin D3 Xopenex 2 puffs and Atrovent 2 puffs inhaler 3 times daily  Asymptomatic pyuria UA done on 08/11/2019 showed WBC UA 21-50 with negative leukocyte and negative nitrite Urine culture in process Continue to hold off antibiotics  Acute on chronic combined systolic and diastolic CHF Presented with elevated BNP greater than 2000 2D echo done on 08/07/2019 showed reduced LVEF 35 to 40% Continue strict I's and O's and daily weight  Physical debility/ambulatory dysfunction PT assessed and recommended home health PT with 24-hour supervision  Resolving AKI Presented with creatinine of 1.30 with GFR 47 Baseline creatinine of 1 and GFR greater than 60 Continue to avoid nephrotoxins Monitor urine output  Chronic constipation associated with mild intractable nausea Currently on bowel regimen On MiraLAX 17 g daily and Senokot nightly Increase MiraLAX twice daily and Senokot twice daily Add Dulcolax suppository Continue antiemetics as needed  Hyperthyroidism Patient with history of Graves' disease/multinodular goiter. Continue methimazole. Propranolol changed to metoprolol per cardiology. IncreasedToprol-XL to 37.5 mg daily. Outpatient follow-up with endocrinology.  Hypertension Continue Toprol-XL and amlodipine. Follow.   Gastroesophageal reflux disease Continue PPI.  Mild thrombocytopenia Likely secondary to viral illness.  Continue to follow   Code Status:Full Family Communication:  Call patient's daughter at [phone], plan of care and discharge were discussed in detail.  Expresses understanding and agreement with plan.   Disposition Plan: Patient is currently not appropriate for discharge due to persistent symptomatology.  If patient remained  stable to be discharged on 08/14/2019   Consultants:  : Dr. Purvis Sheffield 08/05/2019  Procedures:  Chest x-ray 08/05/2019, 08/07/2019  2D echo 08/07/2019  Discharge Instructions:   Discharge Instructions    MyChart COVID-19 home monitoring program   Complete by: Aug 08, 2019    Is the patient willing to use the MyChart Mobile App for home monitoring?: Yes   Activity as tolerated - No restrictions   Complete by: As directed    Diet - low sodium heart healthy   Complete by: As directed    Discharge instructions   Complete by: As directed    1)Please follow up with your PCP in a week.Do a BMP test during the follow up. 2)Follow up with your cardiologist in 2 weeks. 3)Take prescribed medications as instructed. 5)Do a Chest CT with contrast as an outpatient as per the referral of your PCP. 5)Infection Prevention Recommendations for Individuals Confirmed to have, or Being Evaluated for, 2019 Novel Coronavirus (COVID-19) Infection Who Receive Care at Home  Individuals who are confirmed to have, or are being evaluated for, COVID-19 should follow the prevention steps below until a healthcare provider or local or state health department says they can return to normal activities.  Stay home except to get medical care You should restrict activities outside your home, except for getting medical care. Do not go to work, school, or public areas, and do not use public transportation or taxis.  Call ahead before visiting your doctor Before your medical appointment, call  the healthcare provider and tell them that you have, or are being evaluated for, COVID-19 infection. This will help the healthcare provider's office take steps to keep other people from getting infected. Ask your healthcare provider to call the local or state health department.  Monitor your symptoms Seek prompt medical attention if your illness is worsening (e.g., difficulty breathing). Before going to your  medical appointment, call the healthcare provider and tell them that you have, or are being evaluated for, COVID-19 infection. Ask your healthcare provider to call the local or state health department.  Wear a facemask You should wear a facemask that covers your nose and mouth when you are in the same room with other people and when you visit a healthcare provider. People who live with or visit you should also wear a facemask while they are in the same room with you.  Separate yourself from other people in your home As much as possible, you should stay in a different room from other people in your home. Also, you should use a separate bathroom, if available.  Avoid sharing household items You should not share dishes, drinking glasses, cups, eating utensils, towels, bedding, or other items with other people in your home. After using these items, you should wash them thoroughly with soap and water.  Cover your coughs and sneezes Cover your mouth and nose with a tissue when you cough or sneeze, or you can cough or sneeze into your sleeve. Throw used tissues in a lined trash can, and immediately wash your hands with soap and water for at least 20 seconds or use an alcohol-based hand rub.  Wash your [employer] your hands often and thoroughly with soap and water for at least 20 seconds. You can use an alcohol-based hand sanitizer if soap and water are not available and if your hands are not visibly dirty. Avoid touching your eyes, nose, and mouth with unwashed hands.   Prevention Steps for Caregivers and Household Members of Individuals Confirmed to have, or Being Evaluated for, COVID-19 Infection Being Cared for in the Home  If you live with, or provide care at home for, a person confirmed to have, or being evaluated for, COVID-19 infection please follow these guidelines to prevent infection:  Follow healthcare provider's instructions Make sure that you understand and can help  the patient follow any healthcare provider instructions for all care.  Provide for the patient's basic needs You should help the patient with basic needs in the home and provide support for getting groceries, prescriptions, and other personal needs.  Monitor the patient's symptoms If they are getting sicker, call his or her medical provider and tell them that the patient has, or is being evaluated for, COVID-19 infection. This will help the healthcare provider's office take steps to keep other people from getting infected. Ask the healthcare provider to call the local or state health department.  Limit the number of people who have contact with the patient If possible, have only one caregiver for the patient. Other household members should stay in another home or place of residence. If this is not possible, they should stay in another room, or be separated from the patient as much as possible. Use a separate bathroom, if available. Restrict visitors who do not have an essential need to be in the home.  Keep older adults, very young children, and other sick people away from the patient Keep older adults, very young children, and those who have compromised immune systems or chronic  health conditions away from the patient. This includes people with chronic heart, lung, or kidney conditions, diabetes, and cancer.  Ensure good ventilation Make sure that shared spaces in the home have good air flow, such as from an air conditioner or an opened window, weather permitting.  Wash your hands often Wash your hands often and thoroughly with soap and water for at least 20 seconds. You can use an alcohol based hand sanitizer if soap and water are not available and if your hands are not visibly dirty. Avoid touching your eyes, nose, and mouth with unwashed hands. Use disposable paper towels to dry your hands. If not available, use dedicated cloth towels and replace them when they become  wet.  Wear a facemask and gloves Wear a disposable facemask at all times in the room and gloves when you touch or have contact with the patient's blood, body fluids, and/or secretions or excretions, such as sweat, saliva, sputum, nasal mucus, vomit, urine, or feces. Ensure the mask fits over your nose and mouth tightly, and do not touch it during use. Throw out disposable facemasks and gloves after using them. Do not reuse. Wash your hands immediately after removing your facemask and gloves. If your personal clothing becomes contaminated, carefully remove clothing and launder. Wash your hands after handling contaminated clothing. Place all used disposable facemasks, gloves, and other waste in a lined container before disposing them with other household waste. Remove gloves and wash your hands immediately after handling these items.  Do not share dishes, glasses, or other household items with the patient Avoid sharing household items. You should not share dishes, drinking glasses, cups, eating utensils, towels, bedding, or other items with a patient who is confirmed to have, or being evaluated for, COVID-19 infection. After the person uses these items, you should wash them thoroughly with soap and water.  Wash laundry thoroughly Immediately remove and wash clothes or bedding that have blood, body fluids, and/or secretions or excretions, such as sweat, saliva, sputum, nasal mucus, vomit, urine, or feces, on them. Wear gloves when handling laundry from the patient. Read and follow directions on labels of laundry or clothing items and detergent. In general, wash and dry with the warmest temperatures recommended on the label.  Clean all areas the individual has used often Clean all touchable surfaces, such as counters, tabletops, doorknobs, bathroom fixtures, toilets, phones, keyboards, tablets, and bedside tables, every day. Also, clean any surfaces that may have blood, body fluids, and/or  secretions or excretions on them. Wear gloves when cleaning surfaces the patient has come in contact with. Use a diluted bleach solution (e.g., dilute bleach with 1 part bleach and 10 parts water) or a household disinfectant with a label that says EPA-registered for coronaviruses. To make a bleach solution at home, add 1 tablespoon of bleach to 1 quart (4 cups) of water. For a larger supply, add  cup of bleach to 1 gallon (16 cups) of water. Read labels of cleaning products and follow recommendations provided on product labels. Labels contain instructions for safe and effective use of the cleaning product including precautions you should take when applying the product, such as wearing gloves or eye protection and making sure you have good ventilation during use of the product. Remove gloves and wash hands immediately after cleaning.  Monitor yourself for signs and symptoms of illness Caregivers and household members are considered close contacts, should monitor their health, and will be asked to limit movement outside of the home to the extent  possible. Follow the monitoring steps for close contacts listed on the symptom monitoring form.    Discharge instructions   Complete by: As directed    Follow-up PCP, follow nurse instruction at discharge. Continue inhalers as prescribed, incentive spirometer, increase your activity as tolerated   Increase activity slowly   Complete by: As directed    Increase activity slowly   Complete by: As directed        Medication List    STOP taking these medications   isosorbide mononitrate 60 MG 24 hr tablet Commonly known as: IMDUR   lisinopril 20 MG tablet Commonly known as: ZESTRIL   propranolol 60 MG tablet Commonly known as: INDERAL     TAKE these medications   amLODipine 5 MG tablet Commonly known as: NORVASC Take 1 tablet (5 mg total) by mouth daily. What changed:   medication strength  how much to take   ascorbic acid 500 MG  tablet Commonly known as: VITAMIN C Take 1 tablet (500 mg total) by mouth 2 (two) times daily.   aspirin EC 81 MG tablet Take 1 tablet (81 mg total) by mouth daily. What changed:   medication strength  how much to take  when to take this   atorvastatin 20 MG tablet Commonly known as: LIPITOR TAKE 1 TABLET BY MOUTH EVERY DAY What changed:   when to take this  additional instructions   cholecalciferol 10 MCG (400 UNIT) Tabs tablet Commonly known as: VITAMIN D3 Take 400 Units daily by mouth.   dexamethasone 4 MG tablet Commonly known as: DECADRON Take 1 tablet (4 mg total) by mouth daily for 5 days. Start taking on: August 14, 2019   esomeprazole 40 MG capsule Commonly known as: NexIUM Take 1 capsule (40 mg total) by mouth daily.   ezetimibe 10 MG tablet Commonly known as: ZETIA Take 1 tablet (10 mg total) by mouth daily.   Ferrous Sulfate Dried 45 MG Tbcr Commonly known as: EQ Slow-Release Iron Take 1 tablet by mouth daily.   guaiFENesin 600 MG 12 hr tablet Commonly known as: MUCINEX Take 2 tablets (1,200 mg total) by mouth 2 (two) times daily for 5 days.   influenza vaccine adjuvanted 0.5 ML injection Commonly known as: FLUAD Inject 0.5 mLs into the muscle tomorrow at 10 am for 1 dose.   ipratropium 17 MCG/ACT inhaler Commonly known as: ATROVENT HFA Inhale 2 puffs into the lungs every 8 (eight) hours.   levalbuterol 45 MCG/ACT inhaler Commonly known as: XOPENEX HFA Inhale 2 puffs into the lungs every 8 (eight) hours.   loperamide 2 MG capsule Commonly known as: IMODIUM Take 1 capsule (2 mg total) by mouth as needed for diarrhea or loose stools.   methimazole 10 MG tablet Commonly known as: TAPAZOLE TAKE 4 TABLETS BY MOUTH TWICE DAILY What changed:   how much to take  how to take this  when to take this   metoprolol succinate 25 MG 24 hr tablet Commonly known as: TOPROL-XL Take 1.5 tablets (37.5 mg total) by mouth daily. Start taking on:  August 14, 2019   nitroGLYCERIN 0.4 MG SL tablet Commonly known as: NITROSTAT PLACE 1 TABLET UNDER THE TONGUE EVERY 5 MINUTES FOR CHEST PAIN FOR 3 DOSES   oxybutynin 10 MG 24 hr tablet Commonly known as: DITROPAN-XL TAKE 1 TABLET BY MOUTH ONCE DAILY What changed: when to take this   polyethylene glycol 17 g packet Commonly known as: MIRALAX / GLYCOLAX Take 17 g by mouth 2 (two)  times daily.   potassium chloride 10 MEQ CR capsule Commonly known as: MICRO-K TAKE 1 CAPSULE BY MOUTH TWICE DAILY   senna-docusate 8.6-50 MG tablet Commonly known as: Senokot-S Take 2 tablets by mouth at bedtime.   traMADol 50 MG tablet Commonly known as: ULTRAM Take 1-2 tablets (50-100 mg total) by mouth every 6 (six) hours as needed for moderate pain.   venlafaxine XR 150 MG 24 hr capsule Commonly known as: EFFEXOR-XR TAKE 1 CAPSULE BY MOUTH EVERY NIGHT AT BEDTIME - office visit before next refill What changed:   how much to take  how to take this  when to take this   Vitamin B Complex Tabs Take 1 tablet by mouth daily.   zinc sulfate 220 (50 Zn) MG capsule Take 1 capsule (220 mg total) by mouth daily.      Follow-up Information    Shelva Majestic, MD. Schedule an appointment as soon as possible for a visit in 1 week(s).   Specialty: Family Medicine Contact information: [address] [place] [address] [phone]        Lyn Records, MD Follow up on 09/04/2019.   Specialty: Cardiology Why: at 9:30 AM  with his nurse practitioner Norma Fredrickson  Contact information: [address] [place] [address] [phone]        [facility] Follow up.   Why: aka [facility] Contact information: [address] [place] [address] [phone]          Allergies  Allergen Reactions  . Sulfa Drugs Cross Reactors Shortness Of Breath and Palpitations     Procedures /Studies:   Dg Chest 2 View  Result Date:  08/05/2019 CLINICAL DATA:  Chest pain, back pain EXAM: CHEST - 2 VIEW COMPARISON:  09/29/2017 FINDINGS: Heart size is upper limits of normal, stable. Calcific aortic knob. Rounded 11 mm nodular density projects within the left upper lobe. Right lung appears clear. No pleural effusion. No pneumothorax. IMPRESSION: Rounded 1.1 cm nodular density projecting within the left upper lobe. Recommend CT of the chest for further evaluation. Electronically Signed   By: Duanne Guess M.D.   On: 08/05/2019 11:07   Dg Chest Port 1 View  Result Date: 08/07/2019 CLINICAL DATA:  Chest pain.  COVID-19 positive EXAM: PORTABLE CHEST 1 VIEW COMPARISON:  Two days ago FINDINGS: Normal heart size for portable technique. Minimal atelectasis at the right base. There is no edema, consolidation, effusion, or pneumothorax. Nodular density highlighted on prior is not clearly seen today. IMPRESSION: 1. No evidence of pneumonia. 2. Minimal atelectasis. Electronically Signed   By: Marnee Spring M.D.   On: 08/07/2019 06:42   Dg Abd Portable 1v  Result Date: 08/13/2019 CLINICAL DATA:  Constipation EXAM: PORTABLE ABDOMEN - 1 VIEW COMPARISON:  CT abdomen and pelvis-09/30/2011 FINDINGS: Moderate colonic stool burden without evidence of enteric obstruction. No supine evidence of pneumoperitoneum. No pneumatosis or portal venous gas. A punctate phlebolith overlies the left hemipelvis. Limited visualization of the lower thorax is normal. No acute osseous abnormalities. Degenerative change of the lower lumbar spine is suspected though incompletely evaluated. IMPRESSION: Moderate colonic stool burden without evidence of enteric obstruction. Electronically Signed   By: Simonne Come M.D.   On: 08/13/2019 06:21    Subjective:   Patient was seen and examined 08/13/2019, 2:05 PM Patient stable today. No acute distress.  No issues overnight Stable for discharge.  Discharge Exam:    Vitals:   08/12/19 1631  08/12/19 2000 08/12/19 2300  08/13/19 0745  BP: (!) 112/55 106/69 116/66 134/80  Pulse: 61 76 60 65  Resp: 18 18 18 17   Temp: 99.2 F (37.3 C) 98.4 F (36.9 C) 97.9 F (36.6 C) 99.7 F (37.6 C)  TempSrc: Oral Oral Oral Oral  SpO2: 100% 100% 100% 100%  Weight:      Height:        General: Pt lying comfortably in bed & appears in no obvious distress. Cardiovascular: S1 & S2 heard, RRR, S1/S2 +. No murmurs, rubs, gallops or clicks. No JVD or pedal edema. Respiratory: Clear to auscultation without wheezing, rhonchi or crackles. No increased work of breathing. Abdominal:  Non-distended, non-tender & soft. No organomegaly or masses appreciated. Normal bowel sounds heard. CNS: Alert and oriented. No focal deficits. Extremities: no edema, no cyanosis    The results of significant diagnostics from this hospitalization (including imaging, microbiology, ancillary and laboratory) are listed below for reference.      Microbiology:   Recent Results (from the past 240 hour(s))  SARS CORONAVIRUS 2 (TAT 6-24 HRS) Nasopharyngeal Nasopharyngeal Swab     Status: Abnormal   Collection Time: 08/05/19 12:59 PM   Specimen: Nasopharyngeal Swab  Result Value Ref Range Status   SARS Coronavirus 2 POSITIVE (A) NEGATIVE Final    Comment: RESULT CALLED TO, READ BACK BY AND VERIFIED WITH: B OSORIO,RN AT 2016 08/05/2019 BY L BENFIELD (NOTE) SARS-CoV-2 target nucleic acids are DETECTED. The SARS-CoV-2 RNA is generally detectable in upper and lower respiratory specimens during the acute phase of infection. Positive results are indicative of active infection with SARS-CoV-2. Clinical  correlation with patient history and other diagnostic information is necessary to determine patient infection status. Positive results do  not rule out bacterial infection or co-infection with other viruses. The expected result is Negative. Fact Sheet for Patients: HairSlick.nohttps://www.fda.gov/media/138098/download Fact Sheet for Healthcare  Providers: quierodirigir.comhttps://www.fda.gov/media/138095/download This test is not yet approved or cleared by the [place] FDA and  has been authorized for detection and/or diagnosis of SARS-CoV-2 by FDA under an Emergency Use Authorization (EUA). This EUA will remain  in effect (meaning this test can be us ed) for the duration of the COVID-19 declaration under Section 564(b)(1) of the Act, 21 U.S.C. section 360bbb-3(b)(1), unless the authorization is terminated or revoked sooner. Performed at [facility], [address] [place], [place] [address]   MRSA PCR Screening     Status: None   Collection Time: 08/06/19  1:04 PM   Specimen: Nasopharyngeal  Result Value Ref Range Status   MRSA by PCR NEGATIVE NEGATIVE Final    Comment:        The GeneXpert MRSA Assay (FDA approved for NASAL specimens only), is one component of a comprehensive MRSA colonization surveillance program. It is not intended to diagnose MRSA infection nor to guide or monitor treatment for MRSA infections. Performed at [facility], [address] [address] [place], [place] [address]   Urine Culture     Status: Abnormal   Collection Time: 08/11/19  4:11 PM   Specimen: Urine, Clean Catch  Result Value Ref Range Status   Specimen Description URINE, CLEAN CATCH  Final   Special Requests   Final    NONE Performed at [facility], [address] [place], [place] [address]    Culture >=100,000 COLONIES/mL ESCHERICHIA COLI (A)  Final   Report Status 08/13/2019 FINAL  Final   Organism ID, Bacteria ESCHERICHIA COLI (A)  Final  Susceptibility   Escherichia coli - MIC*    AMPICILLIN >=32 RESISTANT Resistant     CEFAZOLIN 8 SENSITIVE Sensitive     CEFTRIAXONE <=1 SENSITIVE Sensitive     CIPROFLOXACIN <=0.25 SENSITIVE Sensitive     GENTAMICIN <=1 SENSITIVE Sensitive     IMIPENEM <=0.25 SENSITIVE Sensitive     NITROFURANTOIN 32 SENSITIVE Sensitive     TRIMETH/SULFA <=20 SENSITIVE Sensitive      AMPICILLIN/SULBACTAM >=32 RESISTANT Resistant     PIP/TAZO <=4 SENSITIVE Sensitive     Extended ESBL NEGATIVE Sensitive     * >=100,000 COLONIES/mL ESCHERICHIA COLI     Labs:   CBC: Recent Labs  Lab 08/07/19 0529 08/08/19 0442 08/09/19 0942 08/10/19 0459 08/11/19 0522  WBC 3.5* 3.3* 3.3* 2.5* 5.6  NEUTROABS 2.5 1.9 2.5  --  4.2  HGB 13.2 12.9 12.9 13.5 13.5  HCT 39.0 38.6 39.6 41.3 40.2  MCV 95.6 95.3 95.9 95.6 94.4  PLT 104* 112* 127* 134* 161*   Basic Metabolic Panel: Recent Labs  Lab 08/08/19 0442 08/09/19 0722 08/09/19 0942 08/10/19 0459 08/11/19 0522 08/12/19 0651 08/13/19 0704  NA 135  --  135 135 136 135  --   K 3.9  --  3.7 4.6 4.4 4.2  --   CL 105  --  104 101 103 102  --   CO2 22  --  23 24 25 23   --   GLUCOSE 94  --  150* 129* 112* 88  --   BUN 31*  --  15 18 22 19   --   CREATININE 1.17*  --  1.04* 1.30* 1.03* 1.09*  --   CALCIUM 8.2*  --  8.3* 8.8* 8.7* 8.7*  --   MG 1.9 1.8  --  1.9 2.2 2.0 2.0   Liver Function Tests: Recent Labs  Lab 08/07/19 0529 08/08/19 0442  AST 27 25  ALT 16 17  ALKPHOS 58 48  BILITOT 0.9 0.6  PROT 6.8 6.4*  ALBUMIN 3.3* 2.9*   BNP (last 3 results) Recent Labs    08/07/19 0529 08/08/19 0442 08/09/19 0723  BNP 203.3* 294.3* 386.1*   Cardiac Enzymes: No results for input(s): CKTOTAL, CKMB, CKMBINDEX, TROPONINI in the last 168 hours. CBG: No results for input(s): GLUCAP in the last 168 hours. Hgb A1c No results for input(s): HGBA1C in the last 72 hours. Lipid Profile No results for input(s): CHOL, HDL, LDLCALC, TRIG, CHOLHDL, LDLDIRECT in the last 72 hours. Thyroid function studies No results for input(s): TSH, T4TOTAL, T3FREE, THYROIDAB in the last 72 hours.  Invalid input(s): FREET3 Anemia work up Recent Labs    08/12/19 0651 08/13/19 0704  FERRITIN 607* 629*   Urinalysis    Component Value Date/Time   COLORURINE AMBER (A) 08/11/2019 1619   APPEARANCEUR CLOUDY (A) 08/11/2019 1619   LABSPEC 1.024  08/11/2019 1619   PHURINE 5.0 08/11/2019 1619   GLUCOSEU NEGATIVE 08/11/2019 1619   HGBUR NEGATIVE 08/11/2019 1619   BILIRUBINUR NEGATIVE 08/11/2019 1619   BILIRUBINUR Negative 01/20/2018 1214   KETONESUR NEGATIVE 08/11/2019 1619   PROTEINUR 100 (A) 08/11/2019 1619   UROBILINOGEN 1.0 01/20/2018 1214   UROBILINOGEN 1.0 05/17/2014 0342   NITRITE NEGATIVE 08/11/2019 1619   LEUKOCYTESUR NEGATIVE 08/11/2019 1619    Time coordinating discharge: Over 55 minutes  SIGNED: Deatra James, MD, FACP, FHM. [facility],  Pager [phone]  If 7PM-7AM, please contact night-coverage [email or web address], Password [facility] 08/13/2019, 2:05 PM

## 2019-08-14 DIAGNOSIS — N390 Urinary tract infection, site not specified: Secondary | ICD-10-CM

## 2019-08-14 DIAGNOSIS — K5900 Constipation, unspecified: Secondary | ICD-10-CM

## 2019-08-14 DIAGNOSIS — B9620 Unspecified Escherichia coli [E. coli] as the cause of diseases classified elsewhere: Secondary | ICD-10-CM

## 2019-08-14 LAB — BASIC METABOLIC PANEL
Anion gap: 9 (ref 5–15)
BUN: 22 mg/dL (ref 8–23)
CO2: 26 mmol/L (ref 22–32)
Calcium: 9.2 mg/dL (ref 8.9–10.3)
Chloride: 100 mmol/L (ref 98–111)
Creatinine, Ser: 1.03 mg/dL — ABNORMAL HIGH (ref 0.44–1.00)
GFR calc Af Amer: >60 mL/min (ref >60)
GFR calc non Af Amer: 54 mL/min — ABNORMAL LOW (ref >60)
Glucose, Bld: 104 mg/dL — ABNORMAL HIGH (ref 70–99)
Potassium: 4.9 mmol/L (ref 3.5–5.1)
Sodium: 135 mmol/L (ref 135–145)

## 2019-08-14 LAB — FERRITIN: Ferritin: 525 ng/mL — ABNORMAL HIGH (ref 11–307)

## 2019-08-14 LAB — MAGNESIUM: Magnesium: 2.6 mg/dL — ABNORMAL HIGH (ref 1.7–2.4)

## 2019-08-14 LAB — D-DIMER, QUANTITATIVE: D-Dimer, Quant: 0.37 ug/mL-FEU (ref 0.00–0.50)

## 2019-08-14 LAB — C-REACTIVE PROTEIN: CRP: 1.4 mg/dL — ABNORMAL HIGH (ref <1.0)

## 2019-08-14 LAB — LACTATE DEHYDROGENASE: LDH: 174 U/L (ref 98–192)

## 2019-08-14 MED ORDER — CEFDINIR 300 MG PO CAPS
300.0000 mg | ORAL_CAPSULE | Freq: Two times a day (BID) | ORAL | Status: DC
  Administered 2019-08-14: 300 mg via ORAL
  Filled 2019-08-14 (×2): qty 1

## 2019-08-14 MED ORDER — SENNOSIDES-DOCUSATE SODIUM 8.6-50 MG PO TABS: 2.0000 | ORAL_TABLET | Freq: Every day | ORAL | 0 refills | Status: DC

## 2019-08-14 MED ORDER — SENNOSIDES-DOCUSATE SODIUM 8.6-50 MG PO TABS: 2.0000 | ORAL_TABLET | Freq: Every evening | ORAL | Status: DC | PRN

## 2019-08-14 MED ORDER — POLYETHYLENE GLYCOL 3350 17 G PO PACK: 17.0000 g | PACK | Freq: Every day | ORAL | 0 refills | Status: DC | PRN

## 2019-08-14 MED ORDER — CEFDINIR 300 MG PO CAPS: 300.0000 mg | ORAL_CAPSULE | Freq: Two times a day (BID) | ORAL | 0 refills | Status: AC

## 2019-08-14 MED ORDER — POLYETHYLENE GLYCOL 3350 17 G PO PACK: 17.0000 g | PACK | Freq: Every day | ORAL | Status: DC | PRN

## 2019-08-14 NOTE — Progress Notes (Signed)
Sleeping most of shift easily awaken, no further BMs- abdomen soft non distended no complaints of pain, VSS-afebrile.Made comfortable, will continue to observe and review plan of care and discharge as possible.

## 2019-08-14 NOTE — Progress Notes (Signed)
PROGRESS NOTE    [name]  MWN:[id number] DOB: 06-26-1946 DOA: 08/05/2019 PCP: Marin Olp, MD    Brief Narrative:  Patient is a 73 year old female with history of coronary artery disease, status post cardiac cath with nonobstructive lesions in 8/18,history of hypertension, hyperlipidemia who presents to the emergency department with complaints of tightness in the left chest radiating to the left arm, worsening shortness of breath. She reported history of exposure to COVID about 2 weeks ago but tested negative for COVID about a week ago. She complained of general weakness on presentation. On presentation, her BNP was elevated 2000 and had mild elevated troponins. EKG showed ST depression in the inferolateral leads. Cardiology consulted. Echo showed reduced EF.She is currently COVID symptom-free except for generalized weakness and is saturating fine on room air. There is no plan for intervention by cardiology for now.  She will be followed by cardiology as an outpatient.  Medication adjusted.  Currently she is hemodynamically stable for discharge.  Respiratory status has been stable since admission.  She was seen by physical therapy and recommended home health on discharge.   Assessment & Plan:   Principal Problem:   Chest pain Active Problems:   HTN (hypertension)   Coronary artery disease involving native coronary artery of native heart with angina pectoris (HCC)   Hyperthyroidism   Demand ischemia ([place])   COVID-19 virus infection   COVID-19   AKI (acute kidney injury) ([place])   E. 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  Chest x-ray done was negative for pneumonia effusion or pulmonary edema.  Patient received a dose of Lasix with some clinical improvement.  2D echo showed a EF of 35 to 40%, moderately decreased left ventricular function, pseudonormalization  pattern of LV diastolic filling, severely dilated left atrium.  2D echo from December 2018 had a EF of 50 to 55%.  Patient seen in consultation by cardiology recommended continued cardiac medications of Norvasc, Lipitor, metoprolol succinate.  Outpatient follow-up with cardiology.  2  COVID-19 infection Patient presented with shortness of breath which felt could be from possible congestive heart failure.  Patient denies any shortness of breath today.  Patient denies any ongoing chest pain.  Chest x-ray negative for any acute infiltrate.  Not hypoxic with sats of 97% on room air.  Inflammatory markers trending down.  Continue Decadron 4 mg daily for the next 5 days then stop.  Patient will need to be quarantined for at least 3 weeks from when COVID-19 was tested and noted to be positive.  Outpatient follow-up with PCP.    3.  Chest pain Patient noted with history of coronary vasospasm.  On admission troponin was noted to be slightly elevated however seem to have plateaued.  Patient seen in consultation by cardiology who feel this is consistent with demand ischemia rather than acute coronary syndrome.  Patient also noted to have some pronounced ST depression in inferior leads on 08/05/2019.  Nitropaste ordered however was stopped due to hypotension.  Will discontinued Nitropaste of MAR.  Patient known noted to have decreased EF.  Patient started on amlodipine which was increased to 5 mg daily.  Cardiology.  Continue aspirin 81 mg daily.  Patient placed on Toprol-XL and dose increased to 37.5 mg daily due to nonsustained V. tach.  No further episodes of nonsustained V. tach.  Continue statin.  Outpatient follow-up with cardiology.  4.  Acute  kidney injury Resolved.  5.  History of coronary artery disease Patient seen by cardiology.  No further invasive cardiac work-up needed at this time.  Continue current cardiac medications.  Outpatient follow-up.  6.  Left upper lobe nodule Noted on chest x-ray which  was a rounded 1.1 cm nodular density projecting within left upper lobe.  outpatient CT for follow-up which can be done after resolution of patient's COVID.  7.  Hyperthyroidism Patient with history of Graves' disease/multinodular goiter.  Continue methimazole.  Propranolol changed to metoprolol per cardiology.  Increased Toprol-XL to 37.5 mg daily.  Outpatient follow-up with endocrinology.  8.  Hypertension Continue Toprol-XL and amlodipine.  Follow.   9.  Gastroesophageal reflux disease Continue PPI.  10.  Mild thrombocytopenia Likely secondary to viral illness.  Slowly improving.  Follow.  11.  Asymptomatic nonsustained V. tach Palpation patient with asymptomatic nonsustained V. tach early in the morning of 08/10/2019.  No further episodes.  Continue current dose of Toprol-XL.  Keep magnesium greater than 2.  Keep potassium greater than 4.  Outpatient follow-up with cardiology.  12.  Constipation Patient placed on a bowel regimen of MiraLAX twice daily as well as Senokot-S twice daily.  Patient did receive some sorbitol and Dulcolax suppository.  Patient noted to have multiple loose stools yesterday and early this morning.  Will change MiraLAX to as needed.  Change Senokot S2 as needed.  Stools being more formed.  Will discharge on MiraLAX as needed as well as Senokot-S nightly.  Outpatient follow-up.   13 nausea Questionable etiology.  Likely secondary to constipation.  Resolved.  14.  E. coli UTI Patient noted to have had some complaints of dysuria during the hospitalization and as such urinalysis was done.  Urine cultures with greater than 100,000 colonies of E. coli.  Patient be discharged home on 3 days of Omnicef to complete a course of antibiotic treatment.  Outpatient follow-up with PCP.    DVT prophylaxis: Lovenox Code Status: Full Family Communication: Updated patient Disposition Plan: Discharge home.    Consultants:   : Dr. Purvis Sheffield  08/05/2019  Procedures:  Chest x-ray 08/05/2019, 08/07/2019  2D echo 08/07/2019  Antimicrobials:  Oral Omnicef 10/19/ 2020 x 3 days   Subjective: Patient denies any further nausea.  Patient states had multiple loose bowel movements yesterday and one early this morning.  Feels loose bowel movements improving.  Denies any chest pain.  No shortness of breath.   Objective: Vitals:   08/13/19 2000 08/13/19 2346 08/14/19 0600 08/14/19 0749  BP: (!) 97/53 131/68 125/81 123/85  Pulse: (!) 59 63 75 72  Resp: Temp: 98.3 F (36.8 C) 98.2 F (36.8 C)  98 F (36.7 C)  TempSrc: Oral Oral  Oral  SpO2: 99% 100% 100% 97%  Weight:      Height:        Intake/Output Summary (Last 24 hours) at 08/14/2019 1005 Last data filed at 08/14/2019 0815 Gross per 24 hour  Intake 600 ml  Output -  Net 600 ml   Filed Weights   08/05/19 1306 08/06/19 2216  Weight: 61.2 kg 60.4 kg    Examination:  General exam: NAD Respiratory system: Lungs clear to auscultation bilaterally.  No wheezes, no crackles, no rhonchi.  Normal respiratory effort.  Speaking in full sentences. Cardiovascular system: Regular rate rhythm no murmurs rubs or gallops.  No JVD.  No lower extremity edema. Gastrointestinal system: Abdomen is soft, nontender, nondistended, positive bowel sounds.  No  rebound.  No guarding.  Central nervous system: Alert and oriented. No focal neurological deficits. Extremities: Symmetric 5 x 5 power. Skin: No rashes, lesions or ulcers Psychiatry: Judgement and insight appear normal. Mood & affect appropriate.     Data Reviewed: I have personally reviewed following labs and imaging studies  CBC: Recent Labs  Lab 08/08/19 0442 08/09/19 0942 08/10/19 0459 08/11/19 0522  WBC 3.3* 3.3* 2.5* 5.6  NEUTROABS 1.9 2.5  --  4.2  HGB 12.9 12.9 13.5 13.5  HCT 38.6 39.6 41.3 40.2  MCV 95.3 95.9 95.6 94.4  PLT 112* 127* 134* 142*   Basic Metabolic Panel: Recent Labs  Lab  08/08/19 0442  08/09/19 0942 08/10/19 0459 08/11/19 0522 08/12/19 0651 08/13/19 0704 08/14/19 0612  NA 135  --  135 135 136 135  --   --   K 3.9  --  3.7 4.6 4.4 4.2  --   --   CL 105  --  104 101 103 102  --   --   CO2 22  --  23 24 25 23   --   --   GLUCOSE 94  --  150* 129* 112* 88  --   --   BUN 31*  --  15 18 22 19   --   --   CREATININE 1.17*  --  1.04* 1.30* 1.03* 1.09*  --   --   CALCIUM 8.2*  --  8.3* 8.8* 8.7* 8.7*  --   --   MG 1.9   < >  --  1.9 2.2 2.0 2.0 2.6*   < > = values in this interval not displayed.   GFR: Estimated Creatinine Clearance: 43.7 mL/min (A) (by C-G formula based on SCr of 1.09 mg/dL (H)). Liver Function Tests: Recent Labs  Lab 08/08/19 0442  AST 25  ALT 17  ALKPHOS 48  BILITOT 0.6  PROT 6.4*  ALBUMIN 2.9*   No results for input(s): LIPASE, AMYLASE in the last 168 hours. No results for input(s): AMMONIA in the last 168 hours. Coagulation Profile: No results for input(s): INR, PROTIME in the last 168 hours. Cardiac Enzymes: No results for input(s): CKTOTAL, CKMB, CKMBINDEX, TROPONINI in the last 168 hours. BNP (last 3 results) No results for input(s): PROBNP in the last 8760 hours. HbA1C: No results for input(s): HGBA1C in the last 72 hours. CBG: No results for input(s): GLUCAP in the last 168 hours. Lipid Profile: No results for input(s): CHOL, HDL, LDLCALC, TRIG, CHOLHDL, LDLDIRECT in the last 72 hours. Thyroid Function Tests: No results for input(s): TSH, T4TOTAL, FREET4, T3FREE, THYROIDAB in the last 72 hours. Anemia Panel: Recent Labs    08/13/19 0704 08/14/19 0612  FERRITIN 629* 525*   Sepsis Labs: No results for input(s): PROCALCITON, LATICACIDVEN in the last 168 hours.  Recent Results (from the past 240 hour(s))  SARS CORONAVIRUS 2 (TAT 6-24 HRS) Nasopharyngeal Nasopharyngeal Swab     Status: Abnormal   Collection Time: 08/05/19 12:59 PM   Specimen: Nasopharyngeal Swab  Result Value Ref Range Status   SARS  Coronavirus 2 POSITIVE (A) NEGATIVE Final    Comment: RESULT CALLED TO, READ BACK BY AND VERIFIED WITH: B OSORIO,RN AT 2016 08/05/2019 BY L BENFIELD (NOTE) SARS-CoV-2 target nucleic acids are DETECTED. The SARS-CoV-2 RNA is generally detectable in upper and lower respiratory specimens during the acute phase of infection. Positive results are indicative of active infection with SARS-CoV-2. Clinical  correlation with patient history and other diagnostic information is necessary to  determine patient infection status. Positive results do  not rule out bacterial infection or co-infection with other viruses. The expected result is Negative. Fact Sheet for Patients: HairSlick.no Fact Sheet for Healthcare Providers: quierodirigir.com This test is not yet approved or cleared by the [place] FDA and  has been authorized for detection and/or diagnosis of SARS-CoV-2 by FDA under an Emergency Use Authorization (EUA). This EUA will remain  in effect (meaning this test can be [place] ed) for the duration of the COVID-19 declaration under Section 564(b)(1) of the Act, 21 U.S.C. section 360bbb-3(b)(1), unless the authorization is terminated or revoked sooner. Performed at [facility], [address] [place], [place] [address]   MRSA PCR Screening     Status: None   Collection Time: 08/06/19  1:04 PM   Specimen: Nasopharyngeal  Result Value Ref Range Status   MRSA by PCR NEGATIVE NEGATIVE Final    Comment:        The GeneXpert MRSA Assay (FDA approved for NASAL specimens only), is one component of a comprehensive MRSA colonization surveillance program. It is not intended to diagnose MRSA infection nor to guide or monitor treatment for MRSA infections. Performed at [facility], [address] [place], [place] [address]   Urine Culture     Status: Abnormal   Collection Time: 08/11/19  4:11 PM   Specimen: Urine, Clean Catch   Result Value Ref Range Status   Specimen Description URINE, CLEAN CATCH  Final   Special Requests   Final    NONE Performed at [facility], [address], [place], [place] [address]    Culture >=100,000 COLONIES/mL ESCHERICHIA COLI (A)  Final   Report Status 08/13/2019 FINAL  Final   Organism ID, Bacteria ESCHERICHIA COLI (A)  Final      Susceptibility   Escherichia coli - MIC*    AMPICILLIN >=32 RESISTANT Resistant     CEFAZOLIN 8 SENSITIVE Sensitive     CEFTRIAXONE <=1 SENSITIVE Sensitive     CIPROFLOXACIN <=0.25 SENSITIVE Sensitive     GENTAMICIN <=1 SENSITIVE Sensitive     IMIPENEM <=0.25 SENSITIVE Sensitive     NITROFURANTOIN 32 SENSITIVE Sensitive     TRIMETH/SULFA <=20 SENSITIVE Sensitive     AMPICILLIN/SULBACTAM >=32 RESISTANT Resistant     PIP/TAZO <=4 SENSITIVE Sensitive     Extended ESBL NEGATIVE Sensitive     * >=100,000 COLONIES/mL ESCHERICHIA COLI         Radiology Studies: Dg Abd Portable 1v  Result Date: 08/13/2019 CLINICAL DATA:  Constipation EXAM: PORTABLE ABDOMEN - 1 VIEW COMPARISON:  CT abdomen and pelvis-09/30/2011 FINDINGS: Moderate colonic stool burden without evidence of enteric obstruction. No supine evidence of pneumoperitoneum. No pneumatosis or portal venous gas. A punctate phlebolith overlies the left hemipelvis. Limited visualization of the lower thorax is normal. No acute osseous abnormalities. Degenerative change of the lower lumbar spine is suspected though incompletely evaluated. IMPRESSION: Moderate colonic stool burden without evidence of enteric obstruction. Electronically Signed   By: Simonne Come M.D.   On: 08/13/2019 06:21        Scheduled Meds: . amLODipine  2.5 mg Oral Daily  . aspirin  325 mg Oral QODAY  . atorvastatin  20 mg Oral Daily  . cefdinir  300 mg Oral Q12H  . cholecalciferol  1,000 Units Oral Daily  . dexamethasone  4 mg Oral Daily  . enoxaparin (LOVENOX) injection  40 mg Subcutaneous Daily  . ezetimibe  10  mg  Oral Daily  . ferrous sulfate  325 mg Oral Q breakfast  . fosfomycin  3 g Oral Once  . influenza vaccine adjuvanted  0.5 mL Intramuscular Tomorrow-1000  . ipratropium  2 puff Inhalation Q8H  . levalbuterol  2 puff Inhalation Q8H  . magnesium hydroxide  15 mL Oral Once  . methimazole  40 mg Oral BID  . metoprolol succinate  37.5 mg Oral Daily  . oxybutynin  10 mg Oral Daily  . pantoprazole  40 mg Oral Daily  . sodium chloride flush  3 mL Intravenous Q12H  . venlafaxine XR  150 mg Oral Q breakfast  . vitamin C  500 mg Oral BID  . zinc sulfate  220 mg Oral Daily   Continuous Infusions: . sodium chloride       LOS: 7 days    Time spent: 35 minutes    Ramiro Harvest, MD [facility]  If 7PM-7AM, please contact night-coverage [email or web address] 08/14/2019, 10:05 AM

## 2019-08-14 NOTE — Progress Notes (Signed)
Physical Therapy Treatment Patient Details Name: [name] MRN: [id number] DOB: [birth year] Today's Date: 08/14/2019    History of Present Illness Patient is a 73 year old female with history of coronary artery disease, status post cardiac cath with nonobstructive lesions in 8/18,history of hypertension, hyperlipidemia admitted with chest pain positive eleveated BNP and reduced EF.  Work up with serial HS troponins thought not ACS.  Found to be covid positive.    PT Comments    Pt sitting EoB on entry waiting for d/c. Pt educated on HEP from [place] [id number], since she refused HHPT.  Pt able to perform all exercises with no questions. Pt supervision to stand from bed and min guard for walking to wheelchair for d/c. Pt provided paper copy of HEP on way off floor.    Follow Up Recommendations  Home health PT;Supervision/Assistance - 24 hour     Equipment Recommendations  None recommended by PT       Precautions / Restrictions Precautions Precautions: None Restrictions Weight Bearing Restrictions: No    Mobility  Bed Mobility Seated EoB on entry  Transfers Overall transfer level: Needs assistance Equipment used: Rolling walker (2 wheeled) Transfers: Sit to/from Stand Sit to Stand: Supervision         General transfer comment: supervision for safety  Ambulation/Gait Ambulation/Gait assistance: Min guard Gait Distance (Feet): 4 Feet Assistive device: Rolling walker (2 wheeled) Gait Pattern/deviations: Step-through pattern;Decreased step length - right;Decreased step length - left;Shuffle;Narrow base of support;Trunk flexed Gait velocity: slowed Gait velocity interpretation: <1.31 ft/sec, indicative of household ambulator General Gait Details: min guard for safety          Balance Overall balance assessment: Needs assistance   Sitting balance-Leahy Scale: Good Sitting balance - Comments: sat EOB about 10 minutes trying to help nausea eating  crackers and drinking ginger ale   Standing balance support: No upper extremity supported;During functional activity;Bilateral upper extremity supported Standing balance-Leahy Scale: Fair Standing balance comment: needs UE support for dynamic balance                            Cognition Arousal/Alertness: Awake/alert Behavior During Therapy: Flat affect Overall Cognitive Status: Within Functional Limits for tasks assessed                                        Exercises General Exercises - Lower Extremity Long Arc Quad: AROM;Both;5 reps;Seated Hip Flexion/Marching: AROM;Both;Seated;5 reps Toe Raises: AROM;Both;Seated;5 reps Heel Raises: AROM;Both;5 reps;Seated Low Level/ICU Exercises Shoulder Flexion: AROM;Both;5 reps;Seated Elbow Flexion: AROM;Both;5 reps;Seated    General Comments General comments (skin integrity, edema, etc.): VSS       Pertinent Vitals/Pain Pain Assessment: No/denies pain           PT Goals (current goals can now be found in the care plan section) Acute Rehab PT Goals Patient Stated Goal: to feel better PT Goal Formulation: With patient Time For Goal Achievement: 08/22/19 Potential to Achieve Goals: Good    Frequency    Min 3X/week      PT Plan Current plan remains appropriate       AM-PAC PT "6 Clicks" Mobility   Outcome Measure  Help needed turning from your back to your side while in a flat bed without using bedrails?: None Help needed moving from lying on your back to sitting on the side of a  flat bed without using bedrails?: None Help needed moving to and from a bed to a chair (including a wheelchair)?: A Little Help needed standing up from a chair using your arms (e.g., wheelchair or bedside chair)?: A Little Help needed to walk in hospital room?: A Little Help needed climbing 3-5 steps with a railing? : A Little 6 Click Score: 20    End of Session   Activity Tolerance: Patient tolerated treatment  well(nausea) Patient left: in bed;with call bell/phone within reach Nurse Communication: Mobility status PT Visit Diagnosis: Muscle weakness (generalized) (M62.81);Other abnormalities of gait and mobility (R26.89)     Time: 6387-5643 PT Time Calculation (min) (ACUTE ONLY): 22 min  Charges:  $Therapeutic Exercise: 8-22 mins                     Karenna Romanoff B. Beverely Risen PT, DPT Acute Rehabilitation Services Pager [phone] Office [phone]    Elon Alas Fleet 08/14/2019, 5:53 PM

## 2019-08-14 NOTE — Care Management (Addendum)
Pt remains in house am of 10/19.  CM informed TOC supervisor.  TOC supervisor spoke with both pt and her daughter this am (conversation documented on HAR note placed on account by supervisor).  Daughter informed supervisor that she would be traveling from [place] to [place] to pick pt up for discharge this am.   Pt remains in house at 1430;  CM spoke with attending ; pt experienced frequent loose stools yesterday and this am secondary to treatment of constipation  - per attending this needs to be stabilized prior to pt leaving hospital.  Attending to reassess later this evening and if bowel habits have stabilized with stable labs pt will be able to discharge home today.  TOC supervisor and Unit AD made aware.

## 2019-08-17 ENCOUNTER — Telehealth: Payer: Self-pay

## 2019-08-17 NOTE — Telephone Encounter (Signed)
Copied from [place] [phone]. Topic: Appointment Scheduling - Scheduling Inquiry for Clinic >> Aug 17, 2019 10:50 AM Percell Belt A wrote: Reason for CRM: pt  was just charged from hosp and needs hosp fu as soon as possible for home health and med instructions Best number [phone]

## 2019-08-18 ENCOUNTER — Telehealth: Payer: Self-pay | Admitting: Family Medicine
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
## 2019-08-18 NOTE — Telephone Encounter (Signed)
See note  Copied from [place] [phone]. Topic: General - Other >> Aug 18, 2019 12:46 PM Pauline Good wrote: Reason for CRM: need to speak to nurse concerning getting an aide to assist pt at her house to discuss her meds and how she should be taking it. Pt was discharged from hospital and some of her meds was taken out of her home and pt don't know what she should be taking

## 2019-08-21 ENCOUNTER — Encounter: Payer: Self-pay | Admitting: Family Medicine

## 2019-08-21 ENCOUNTER — Other Ambulatory Visit: Payer: Self-pay

## 2019-08-21 ENCOUNTER — Ambulatory Visit (INDEPENDENT_AMBULATORY_CARE_PROVIDER_SITE_OTHER): Payer: Medicare Other | Admitting: Family Medicine

## 2019-08-21 VITALS — BP 110/64 | HR 103 | Temp 98.7°F | Ht 66.0 in

## 2019-08-21 DIAGNOSIS — R911 Solitary pulmonary nodule: Secondary | ICD-10-CM

## 2019-08-21 DIAGNOSIS — R079 Chest pain, unspecified: Secondary | ICD-10-CM

## 2019-08-21 DIAGNOSIS — N179 Acute kidney failure, unspecified: Secondary | ICD-10-CM

## 2019-08-21 DIAGNOSIS — I248 Other forms of acute ischemic heart disease: Secondary | ICD-10-CM

## 2019-08-21 DIAGNOSIS — D696 Thrombocytopenia, unspecified: Secondary | ICD-10-CM

## 2019-08-21 DIAGNOSIS — N390 Urinary tract infection, site not specified: Secondary | ICD-10-CM

## 2019-08-21 DIAGNOSIS — I10 Essential (primary) hypertension: Secondary | ICD-10-CM | POA: Diagnosis not present

## 2019-08-21 DIAGNOSIS — B9620 Unspecified Escherichia coli [E. coli] as the cause of diseases classified elsewhere: Secondary | ICD-10-CM

## 2019-08-21 DIAGNOSIS — U071 COVID-19: Secondary | ICD-10-CM

## 2019-08-21 DIAGNOSIS — I5020 Unspecified systolic (congestive) heart failure: Secondary | ICD-10-CM | POA: Diagnosis not present

## 2019-08-21 DIAGNOSIS — E0590 Thyrotoxicosis, unspecified without thyrotoxic crisis or storm: Secondary | ICD-10-CM

## 2019-08-21 DIAGNOSIS — I25119 Atherosclerotic heart disease of native coronary artery with unspecified angina pectoris: Secondary | ICD-10-CM

## 2019-08-21 MED ORDER — VENLAFAXINE HCL ER 150 MG PO CP24: ORAL_CAPSULE | ORAL | 5 refills | Status: DC

## 2019-08-21 MED ORDER — NITROGLYCERIN 0.4 MG SL SUBL: SUBLINGUAL_TABLET | SUBLINGUAL | 0 refills | Status: DC

## 2019-08-21 MED ORDER — METOPROLOL SUCCINATE ER 25 MG PO TB24: 37.5000 mg | ORAL_TABLET | Freq: Every day | ORAL | 1 refills | Status: DC

## 2019-08-21 NOTE — Assessment & Plan Note (Addendum)
Once again had issues with vasospasm-last heart catheterization nonobstructive coronary artery disease.  Continue cardiology follow-up.  Cardiology stopped Imdur due to lower blood pressures-I do not feel we can restart at this time.  Amlodipine may prevent vasospasm-continue current medication

## 2019-08-21 NOTE — Telephone Encounter (Signed)
FYI: pt coming in for visit today.

## 2019-08-21 NOTE — Assessment & Plan Note (Signed)
Patient maintained on methimazole during hospitalization-should continue this and endocrinology follow-up.  Was changed from propranolol to metoprolol by cardiology due to LV dysfunction

## 2019-08-21 NOTE — Telephone Encounter (Signed)
Pt called and notified, voiced understanding.

## 2019-08-21 NOTE — Assessment & Plan Note (Signed)
Appears treated with cefdinir x3 days-had some mild dysuria yesterday but resolved today-she should follow-up if has recurrent symptoms

## 2019-08-21 NOTE — Assessment & Plan Note (Signed)
Likely related to COVID-19 the elevated troponins-seem to have placed demand on baseline diastolic heart failure and may have systolic heart failure as well with EF of 35%

## 2019-08-21 NOTE — Telephone Encounter (Signed)
Thanks for the update-please have her bring all of her medicines with her to the visit today so we can review

## 2019-08-21 NOTE — Assessment & Plan Note (Signed)
Appears to have resolved with creatinine down to 1 by time of discharge from peak of 2.6-we will update CMP today

## 2019-08-21 NOTE — Assessment & Plan Note (Addendum)
COVID-19 appeared to lead to demand ischemia which led to CHF exacerbation.  Patient improved during hospitalization and was able to be discharged home with physical therapy-I am happy to sign for physical therapy paperwork if needed or other home health needs.  Patient is asymptomatic at this time so technically can and her quarantine given 16 days since positive diagnosis.  Wonder if COVID-19 may have contributed to reduction in ejection fraction.  Interested in cardiology's opinion as well.

## 2019-08-21 NOTE — Patient Instructions (Addendum)
Team- have lab come draw labs today in room.   Schedule 1 month follow up for her and bring all meds again so we can review together.    Stay off Propranolol Stay on Metoprolol (marked new on bottle)  Stay off lisinopril 20mg - wrote hold on bottle as may start back next visit  Stay off Imdur for now- wrote stop on bottle  We will call you within two weeks about your referral to CT of your chest to follow up on small nodule that was found. If you do not hear within 3 weeks, give [place] a call.

## 2019-08-21 NOTE — Assessment & Plan Note (Signed)
Resolved at this point but was related to demand ischemia caused by COVID-19 infection

## 2019-08-21 NOTE — Progress Notes (Signed)
Phone [phone]   Subjective:  [name] is a 73 y.o. year old very pleasant female patient who presents for transitional care management and hospital follow up forCoronary artery disease involving native coronary artery of native heart with angina pectoris (HCC) with positive Covid-19. Patient was hospitalized from 08/05/2019 to 08/13/2019. A TCM phone call was not completed.    See problem oriented charting as well ROS-no fever/chills/nausea/vomiting.  Denies cough or congestion.  Denies shortness of breath.  Past Medical History-  Patient Active Problem List   Diagnosis Date Noted  . Heart failure with reduced ejection fraction (HCC) 08/21/2019    Priority: High  . COVID-19 08/07/2019    Priority: High  . Hyperthyroidism 05/22/2017    Priority: High  . Coronary artery disease involving native coronary artery of native heart with angina pectoris [facility])     Priority: High  . Seizures (HCC)     Priority: High  . Demand ischemia (HCC)     Priority: Medium  . Protein-calorie malnutrition, moderate (HCC) 05/22/2017    Priority: Medium  . Overactive bladder 09/19/2014    Priority: Medium  . Hypercholesteremia     Priority: Medium  . GERD (gastroesophageal reflux disease) 10/04/2011    Priority: Medium  . HTN (hypertension) 10/01/2011    Priority: Medium  . Arthritis     Priority: Low  . Anemia 12/22/2017    Medications- reviewed and updated  A medical reconciliation was performed comparing current medicines to hospital discharge medications. Current Outpatient Medications  Medication Sig Dispense Refill  . amLODipine (NORVASC) 5 MG tablet Take 1 tablet (5 mg total) by mouth daily. 30 tablet 0  . aspirin EC 81 MG tablet Take 1 tablet (81 mg total) by mouth daily. 30 tablet 1  . atorvastatin (LIPITOR) 20 MG tablet TAKE 1 TABLET BY MOUTH EVERY DAY (Patient taking differently: Take 20 mg by mouth daily at 6 PM. TAKE 1 TABLET BY MOUTH EVERY DAY) 90 tablet 0  . B Complex  Vitamins (VITAMIN B COMPLEX) TABS Take 1 tablet by mouth daily.    . cholecalciferol (VITAMIN D) 400 UNITS TABS tablet Take 400 Units daily by mouth.     . esomeprazole (NEXIUM) 40 MG capsule Take 1 capsule (40 mg total) by mouth daily. 90 capsule 2  . ezetimibe (ZETIA) 10 MG tablet Take 1 tablet (10 mg total) by mouth daily. 90 tablet 2  . Ferrous Sulfate Dried (EQ SLOW-RELEASE IRON) 45 MG TBCR Take 1 tablet by mouth daily. 90 tablet 1  . ipratropium (ATROVENT HFA) 17 MCG/ACT inhaler Inhale 2 puffs into the lungs every 8 (eight) hours. 1 Inhaler 12  . levalbuterol (XOPENEX HFA) 45 MCG/ACT inhaler Inhale 2 puffs into the lungs every 8 (eight) hours. 1 Inhaler 12  . loperamide (IMODIUM) 2 MG capsule Take 1 capsule (2 mg total) by mouth as needed for diarrhea or loose stools. 30 capsule 0  . methimazole (TAPAZOLE) 10 MG tablet TAKE 4 TABLETS BY MOUTH TWICE DAILY (Patient taking differently: Take 40 mg by mouth 2 (two) times daily. TAKE 4 TABLETS BY MOUTH TWICE DAILY) 240 tablet 0  . metoprolol succinate (TOPROL-XL) 25 MG 24 hr tablet Take 1.5 tablets (37.5 mg total) by mouth daily. 135 tablet 1  . nitroGLYCERIN (NITROSTAT) 0.4 MG SL tablet PLACE 1 TABLET UNDER THE TONGUE EVERY 5 MINUTES FOR CHEST PAIN FOR 3 DOSES 25 tablet 0  . oxybutynin (DITROPAN-XL) 10 MG 24 hr tablet TAKE 1 TABLET BY MOUTH ONCE DAILY (  Patient taking differently: Take 10 mg by mouth at bedtime. ) 90 tablet 3  . polyethylene glycol (MIRALAX / GLYCOLAX) 17 g packet Take 17 g by mouth daily as needed for moderate constipation. 14 each 0  . potassium chloride (MICRO-K) 10 MEQ CR capsule TAKE 1 CAPSULE BY MOUTH TWICE DAILY 180 capsule 1  . senna-docusate (SENOKOT-S) 8.6-50 MG tablet Take 2 tablets by mouth at bedtime. 60 tablet 0  . traMADol (ULTRAM) 50 MG tablet Take 1-2 tablets (50-100 mg total) by mouth every 6 (six) hours as needed for moderate pain. 30 tablet 0  . venlafaxine XR (EFFEXOR-XR) 150 MG 24 hr capsule TAKE 1 CAPSULE BY  MOUTH EVERY NIGHT AT BEDTIME - office visit before next refill 30 capsule 5  . vitamin C (VITAMIN C) 500 MG tablet Take 1 tablet (500 mg total) by mouth 2 (two) times daily. 14 tablet 0  . zinc sulfate 220 (50 Zn) MG capsule Take 1 capsule (220 mg total) by mouth daily. 7 capsule 0   No current facility-administered medications for this visit.    Objective  Objective:  BP 110/64   Pulse (!) 103   Temp 98.7 F (37.1 C) (Temporal)   Ht  (1.676 m)   SpO2 96%   BMI 21.49 kg/m  Gen: NAD, resting comfortably, wears mask throughout visit CV: RRR no murmurs rubs or gallops Lungs: CTAB no crackles, wheeze, rhonchi Abdomen: soft/nontender/nondistended/normal bowel sounds.  Ext: no edema Skin: warm, dry    Assessment and Plan:   #Hospital follow-up for hospitalization from October 10-18 S: patient was hospitalized after presenting with left-sided chest tightness radiating to the left arm along with generalized weakness and being found to have a BNP over 2000 with  elevated troponins as well as ST depression in inferior lateral leads.  Nitropaste had to be discontinued due to low blood pressures it appears.  Last catheterization was 2018 with nonobstructive coronary artery disease-symptoms at that time also were thought related to demand ischemia .  Patient also was discovered to have a positive COVID-19 test-had been exposed 2 weeks prior-it was believed that screening for COVID-19 may have produced demand ischemia..  Patient did well in the hospitalization in regard to COVID-19-initial inflammatory markers were elevated but fortunately no lobular infiltrates on x-ray were noted.  She was treated with Decadron.  Was given albuterol as needed for wheezing.  She was sent home with plan for home health PT and 24-hour supervision.  Patient had ejection fraction noted at 35 to 40% during hospitalization as well as grade 2 diastolic dysfunction-ejection fraction had reduced from 50 to 55% in  December 2018.  Patient has a history of chest pain with history of coronary vasospasm-troponin has peaked at 73 and trended down.  Covid-19 appeared to produce demand ischemia and demand ischemia seem to lead to CHF exacerbation.    Patient also experienced mild elevations in creatinine and reducing blood pressure medicines likely helped preserve kidney function.  Creatinine reached a peak of 2.6 above baseline of 1-AKI resolved during hospitalization.Discharge report did not discuss diuretic regimen and she was not discharged on diuretics.  From cardiology note on October 12 appears patient had been given IV Lasix 40 mg x 1 and had significant improvement with this.  E. coli UTI was noted during hospitalization-appears patient was treated with cefdinir for 3 days per medication history though not specifically noted in discharge summary.  Patient reported some mild burning yesterday but states improved today-she knows she  should follow-up with new or worsening symptoms.  E. coli was resistant only to ampicillin and Unasyn.  Chronic constipation was treated with MiraLAX and Senokot-patient states she is using milk of magnesia since getting home.  Hypothyroidism was treated with methimazole during hospitalization.  Cardiology changed propranolol to metoprolol due to decreased LV function she was on 37.5 mg of Toprol extended release by discharge.  Cardiology stopped Imdur and lisinopril due to lower blood pressures.For hypertension patient was continued on Toprol-XL and amlodipine 5 mg.  Cardiology reinitiated amlodipine even over lisinopril or Imdur on 08/07/2019 and appears titrated back up to full 5 mg later-likely due to concern of coronary vasospasm.  Mild thrombocytopenia noted-thought related to acute infection.  We will update CBC with labs today  Medication changes were confusing the patient-she was not sure why she had been stopped on Imdur and we discussed due to lower blood pressures.  She was  not sure why she had stopped lisinopril and we discussed due to lower blood pressures.  She brought all her medications with her-propranolol was not noted in the bag and she seemed hesitant about the metoprolol-not sure she was actually taking metoprolol prior to visit but she did agree to start after visit. We reviewed completed medications including guaifenesin and Decadron.  Patient stated she was out of nitroglycerin and venlafaxine and these were refilled.  Pulmonary nodules noted on CT and recommended outpatient follow-up  A/P:   COVID-19 COVID-19 appeared to lead to demand ischemia which led to CHF exacerbation.  Patient improved during hospitalization and was able to be discharged home with physical therapy-I am happy to sign for physical therapy paperwork if needed or other home health needs.  Patient is asymptomatic at this time so technically can and her quarantine given 16 days since positive diagnosis.  Wonder if COVID-19 may have contributed to reduction in ejection fraction.  Interested in cardiology's opinion as well.  E. coli UTI Appears treated with cefdinir x3 days-had some mild dysuria yesterday but resolved today-she should follow-up if has recurrent symptoms  Demand ischemia ([place]) Likely related to COVID-19 the elevated troponins-seem to have placed demand on baseline diastolic heart failure and may have systolic heart failure as well with EF of 35%  Chest pain Resolved at this point but was related to demand ischemia caused by COVID-19 infection  AKI (acute kidney injury) ([place]) Appears to have resolved with creatinine down to 1 by time of discharge from peak of 2.6-we will update CMP today  Heart failure with reduced ejection fraction ([place]) I would ask for cardiology's input on this diagnosis.  Ejection fraction was 35-40 % during hospitalization.  Patient has a history of diastolic dysfunction.  She was admitted with fluid overload  Demand ischemia from COVID-19 may  have produced strain on the heart/temporary dysfunction leading to reduction in ejection fraction leading to fluid overload -not sure if she will have prolonged reduction in ejection fraction-cardiology did not recommend an ACE inhibitor but she is on a beta-blocker.  We will see if cardiology recommends changes at follow-up-we could change this to diastolic dysfunction.  If blood pressure increases would be interested in starting ACE inhibitor back   Patient was not sent home on diuretics and only mention I can find in the chart as a one-time dose of Lasix during hospitalization-would not recommend ongoing diuretics unless has weight gain, edema, shortness of breath  Hyperthyroidism Patient maintained on methimazole during hospitalization-should continue this and endocrinology follow-up.  Was changed from propranolol  to metoprolol by cardiology due to LV dysfunction  Coronary artery disease involving native coronary artery of native heart with angina pectoris (HCC) Once again had issues with vasospasm-last heart catheterization nonobstructive coronary artery disease.  Continue cardiology follow-up.  Cardiology stopped Imdur due to lower blood pressures-I do not feel we can restart at this time.  Amlodipine may prevent vasospasm-continue current medication   Due to pulmonary nodule-ordered CT scan  Mild thrombocytopenia-trend with labs today  Recommended follow up: Already scheduled for cardiology follow-up-would recommend see us back in 3 to 6 months at the latest-happy to see her sooner if needed Future Appointments  Date Time Provider Department Center  09/04/2019  9:30 AM Rosalio MacadamiaGerhardt, Lori C, NP [id number]    Lab/Order associations:   ICD-10-CM   1. COVID-19  U07.1   2. Demand ischemia (HCC)  I24.8   3. AKI (acute kidney injury) (HCC)  N17.9   4. Heart failure with reduced ejection fraction (HCC)  I50.20   5. Thrombocytopenia (HCC)  D69.6   6. Hyperthyroidism  E05.90    7. Coronary artery disease involving native coronary artery of native heart with angina pectoris (HCC)  I25.119   8. Essential hypertension  I10 CBC with Differential/Platelet    Comprehensive metabolic panel  9. Solitary pulmonary nodule  R91.1 CT Chest Wo Contrast  10. Chest pain, unspecified type  R07.9   11. E. coli UTI  N39.0    B96.20     Meds ordered this encounter  Medications  . venlafaxine XR (EFFEXOR-XR) 150 MG 24 hr capsule    Sig: TAKE 1 CAPSULE BY MOUTH EVERY NIGHT AT BEDTIME - office visit before next refill    Dispense:  30 capsule    Refill:  5  . nitroGLYCERIN (NITROSTAT) 0.4 MG SL tablet    Sig: PLACE 1 TABLET UNDER THE TONGUE EVERY 5 MINUTES FOR CHEST PAIN FOR 3 DOSES    Dispense:  25 tablet    Refill:  0  . metoprolol succinate (TOPROL-XL) 25 MG 24 hr tablet    Sig: Take 1.5 tablets (37.5 mg total) by mouth daily.    Dispense:  135 tablet    Refill:  1    Time Stamp The duration of face-to-face time during this visit was greater than 25 minutes. Greater than 50% of this time was spent in counseling, explanation of diagnosis, planning of further management, and/or coordination of care including medication reconciliation/going through medications one by one with patient and explaining use and why medications were changed during hospitalization, explaining congestive heart failure.    Return precautions advised.  Tana ConchStephen Dyani Babel, MD

## 2019-08-21 NOTE — Assessment & Plan Note (Addendum)
I would ask for cardiology's input on this diagnosis.  Ejection fraction was 35-40 % during hospitalization.  Patient has a history of diastolic dysfunction.  She was admitted with fluid overload  Demand ischemia from COVID-19 may have produced strain on the heart/temporary dysfunction leading to reduction in ejection fraction leading to fluid overload -not sure if she will have prolonged reduction in ejection fraction-cardiology did not recommend an ACE inhibitor but she is on a beta-blocker.  We will see if cardiology recommends changes at follow-up-we could change this to diastolic dysfunction.  If blood pressure increases would be interested in starting ACE inhibitor back   Patient was not sent home on diuretics and only mention I can find in the chart as a one-time dose of Lasix during hospitalization-would not recommend ongoing diuretics unless has weight gain, edema, shortness of breath

## 2019-08-22 LAB — COMPREHENSIVE METABOLIC PANEL
ALT: 62 U/L — ABNORMAL HIGH (ref 0–35)
AST: 20 U/L (ref 0–37)
Albumin: 3.1 g/dL — ABNORMAL LOW (ref 3.5–5.2)
Alkaline Phosphatase: 52 U/L (ref 39–117)
BUN: 24 mg/dL — ABNORMAL HIGH (ref 6–23)
CO2: 24 mEq/L (ref 19–32)
Calcium: 8.8 mg/dL (ref 8.4–10.5)
Chloride: 107 mEq/L (ref 96–112)
Creatinine, Ser: 1.05 mg/dL (ref 0.40–1.20)
GFR: 62.19 mL/min (ref >60.00)
Glucose, Bld: 86 mg/dL (ref 70–99)
Potassium: 4.3 mEq/L (ref 3.5–5.1)
Sodium: 137 mEq/L (ref 135–145)
Total Bilirubin: 0.5 mg/dL (ref 0.2–1.2)
Total Protein: 6.1 g/dL (ref 6.0–8.3)

## 2019-08-22 LAB — CBC WITH DIFFERENTIAL/PLATELET
Basophils Absolute: 0.1 10*3/uL (ref 0.0–0.1)
Basophils Relative: 0.9 % (ref 0.0–3.0)
Eosinophils Absolute: 0.1 10*3/uL (ref 0.0–0.7)
Eosinophils Relative: 1.5 % (ref 0.0–5.0)
HCT: 35.5 % — ABNORMAL LOW (ref 36.0–46.0)
Hemoglobin: 11.9 g/dL — ABNORMAL LOW (ref 12.0–15.0)
Lymphocytes Relative: 31.9 % (ref 12.0–46.0)
Lymphs Abs: 2.3 10*3/uL (ref 0.7–4.0)
MCHC: 33.4 g/dL (ref 30.0–36.0)
MCV: 95.6 fl (ref 78.0–100.0)
Monocytes Absolute: 0.7 10*3/uL (ref 0.1–1.0)
Monocytes Relative: 9.4 % (ref 3.0–12.0)
Neutro Abs: 4.1 10*3/uL (ref 1.4–7.7)
Neutrophils Relative %: 56.3 % (ref 43.0–77.0)
Platelets: 348 10*3/uL (ref 150.0–400.0)
RBC: 3.71 Mil/uL — ABNORMAL LOW (ref 3.87–5.11)
RDW: 14.4 % (ref 11.5–15.5)
WBC: 7.2 10*3/uL (ref 4.0–10.5)

## 2019-08-22 NOTE — Telephone Encounter (Signed)
Unable to complete prior to appointment.   Patient seen 08/21/19

## 2019-08-25 DIAGNOSIS — I10 Essential (primary) hypertension: Secondary | ICD-10-CM | POA: Diagnosis not present

## 2019-09-01 NOTE — Progress Notes (Addendum)
Cardiology Office Note:    Date:  09/04/2019   ID:  [name] Clingenpeel, DOB 12/14/1945, MRN [id number]  PCP:  [name], Stephen O, MD  Cardiologist:  Lesleigh NoeHenry W Smith III, MD  Electrophysiologist:  None   Referring MD: [name], Stephen O, MD   Chief Complaint: hospital follow-up for demand ischemia and newly reduced EF in setting of COVID-19  History of Present Illness:    [name] Ravan is a 73 y.o. female with a history of non-obstructive CAD on cardiac cath in 2018, coronary spasm noted on cardiac cath in 2003, chronic diastolic CHF, hypertension, hyperlipidemia, Grave's disease with multinodular goiter (followed by Dr. Everardo AllEllison), GERD, and chronic lower back pain who is followed by Dr. Katrinka BlazingSmith and presents today for hospital follow-up.  Patient has had multiple cardiac catheterization in the past showing non-obstructive CAD. On cardiac catheterization in 2003, she was noted to intense diffuse left and right coronary spasms relieved with intracoronary nitroglycerin. She was started on oral long-acting nitrates at that time. Patient was admitted in July/August 2018 for subacute thyroiditis and NSTEMI. Echo showed LVEF of 55% with mild hypokinesis of the basalinferior myocardium and grade 2 diastolic dysfunciton. Stress test at that time was considered intermediate risk and showed small defect in apical septal location and medium defect in the basal inferior and inferior location. She underwent left heart catheterization in 05/2017 which showed mild non-obstructive CAD with 10% stenosis of the proximal to mid RCA, 20% stenosis of the distal CX, 10% stenosis of the mid LAD, and 25% stenosis of OM1.  Patient was last seen by Dr. Katrinka BlazingSmith in 04/2018 at which time she reported occasional chest discomfort but was overall doing well from a cardiac standpoint. No medication changes were made and patient was instructed to follow up in 1 year.   Patient recently admitted from 10/10/202 to 08/14/2019 after presenting with  multiple complaints including chest pain, back pain, headache, and decreased appetite. Patient thought this was due to her thyroid as she has had similar symptoms in the past with thyroiditis. High-sensitivity troponin was minimally elevated and relatively flat at 54 >> 54 >> 61 >> 86 >> 77. EKG showed slightly more pronounced ST depressions in inferior and lateral leads. Patient reported not taking home diuretics and initial BNP elevated at 2,030.  Echo showed LVEF of 35-40% with mild LVH, grade 2 diastolic dysfunction, andelevated mean left atrial pressure as well as severely dilated left atrium and moderate MR. She was given 1 dose of IV Lasix in the ED with bump in creatinine and improved BNP  to 203 the next day. She ended up testing positive for COVID-19 which was felt to be the cause of all her symptoms. She was treated with a Decadron taper. Cardiology was consulted and elevated troponin felt to be due to demand ischemia and no further ischemic work-up was planned. Cardiac medications prior to admission included Aspirin 325mg  every other day, Amlodipine 10mg  daily, Lisinopril 20mg  daily, Propranolol 60mg  twice daily,  Imdur 60mg  120mg  daily, Lipitor 20mg  daily, Zetia 10mg  daily. Lisinopril held on admission to allow for diuresis. Patient was initially hypertensive but then became borderline hypotensive with systolic BP as low as the 90's at times so other BP medications were briefly held. Home Amlodipine was eventually restarted at lower dose and home Propranolol was switched to Toprol-XL given reduced EF. Imdur was never restarted. Hospitalization complicated by AKI with creatinine peaking at 2.65. Renal function improved with hydration. Creatinine 1.03 at discharge. However, Lisinopril was not restarted.  Also noted to have thrombocytopenia during admission with platelets as low as 104. Patient complained of some dysuria and found to have E.Coli UTI. She was treated with antibiotics. She also complained of  nausea during admission which was felt to be due to constipation. Abdominal X-ray on 10/18 showed moderate colonic stool burden without evidence of enteric obstruction.  Patient was initially felt to be stable for discharge on 10/13 but patient and family felt very concerned and uncomfortable with this and appealed the discharge. He ended up staying several more days and was discharged on 10/19. Patient discharged on Aspirin  daily, Amlodipine  daily, Toprol-XL  daily.  Patient seen by PCP for follow-up on 08/21/2019 and was recovering well at that time. However, patient did voice confusion with medication changes at discharge and PCP helped clarify this. Repeat labs showed stable creatinine of 1.05, mild anemia with hemoglobin of 11.9,  mildly elevated 62, and mildly low albumin of 3.1 but otherwise unremarkable.  Patient presents today for follow-up. Patient doing well since discharge. She reports 2 types of chest pain 2-3 days after discharge. One time of chest pain she described as dull and steady and the other type of pain she described as sharp. She states pain only lasted for a few seconds and then would resolve on its own. At its worse, she states the pain was 7-8/10 on pain scale but again states this is only for a couple of seconds. Non-exertional. Similar to pain she has had in the past. She has history of coronary spasms. Previously on Imdur but this was discontinued in the hospital due to borderline hypotension. Patient reports very little shortness of breath or coughing - much better than she was anticipating post COVID. She sleeps on 2 pillows which she states she has done for years. No PND. She had minimal ankle swelling last week that improved with elevation. None since. She denies any palpitations. She reports 2 episodes of lightheadedness recently once after standing quickly and once when walking down the hall. No falls or syncope. Patient states abdominal pain, nausea, and bowel  movement have improved since discharge.   Past Medical History:  Diagnosis Date   Arthritis    Chronic lower back pain    Cluster headache    Coronary artery disease    a. 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 non obs dz LHC 2007. c. Non obs dz (mild-mod) by [facility] 05/17/14 d. cath 05/26/17 mild non obstructive CAD   Diverticulitis    a. Hx microperf 2012 - hospitalizated.   GERD (gastroesophageal reflux disease)    Hypercholesteremia    Hypertension    PVC's (premature ventricular contractions)    a. Hx of trigeminy/bigeminy.   Seizures (HCC)    Thyroid disease    hyperthyroid   UTI (lower urinary tract infection)     Past Surgical History:  Procedure Laterality Date   ABDOMINAL HYSTERECTOMY  1987   "partial"-still has ovaries   CARDIAC CATHETERIZATION  05/17/2014   CORONARY ANGIOPLASTY WITH STENT PLACEMENT  1995   "1"   JOINT REPLACEMENT     right knee   LEFT HEART CATH AND CORONARY ANGIOGRAPHY N/A 05/26/2017   Procedure: Left Heart Cath and Coronary Angiography;  Surgeon: Corky Crafts, MD;  Location: [facility] INVASIVE CV LAB;  Service: Cardiovascular;  Laterality: N/A;   LEFT HEART CATHETERIZATION WITH CORONARY ANGIOGRAM N/A 05/17/2014   Procedure: LEFT HEART CATHETERIZATION WITH CORONARY ANGIOGRAM;  Surgeon: Marykay Lex, MD;  Location: [facility] CATH LAB;  Service: Cardiovascular;  Laterality: N/A;   TOTAL KNEE ARTHROPLASTY Right 2005   TUBAL LIGATION  1970   VASCULAR SURGERY      Current Medications: Current Meds  Medication Sig   amLODipine (NORVASC) 5 MG tablet Take 1 tablet (5 mg total) by mouth daily.   aspirin EC 81 MG tablet Take 1 tablet (81 mg total) by mouth daily.   B Complex Vitamins (VITAMIN B COMPLEX) TABS Take 1 tablet by mouth daily.   cholecalciferol (VITAMIN D) 400 UNITS TABS tablet Take 400 Units daily by mouth.    esomeprazole (NEXIUM) 40 MG capsule Take 1 capsule (40 mg total) by mouth daily.   ezetimibe (ZETIA) 10 MG tablet Take 1 tablet  (10 mg total) by mouth daily.   Ferrous Sulfate Dried (EQ SLOW-RELEASE IRON) 45 MG TBCR Take 1 tablet by mouth daily.   ipratropium (ATROVENT HFA) 17 MCG/ACT inhaler Inhale 2 puffs into the lungs every 8 (eight) hours.   levalbuterol (XOPENEX HFA) 45 MCG/ACT inhaler Inhale 2 puffs into the lungs every 8 (eight) hours.   loperamide (IMODIUM) 2 MG capsule Take 1 capsule (2 mg total) by mouth as needed for diarrhea or loose stools.   methimazole (TAPAZOLE) 10 MG tablet TAKE 4 TABLETS BY MOUTH TWICE DAILY   metoprolol succinate (TOPROL-XL) 25 MG 24 hr tablet Take 1.5 tablets (37.5 mg total) by mouth daily.   nitroGLYCERIN (NITROSTAT) 0.4 MG SL tablet PLACE 1 TABLET UNDER THE TONGUE EVERY 5 MINUTES FOR CHEST PAIN FOR 3 DOSES   oxybutynin (DITROPAN-XL) 10 MG 24 hr tablet TAKE 1 TABLET BY MOUTH ONCE DAILY   potassium chloride (MICRO-K) 10 MEQ CR capsule TAKE 1 CAPSULE BY MOUTH TWICE DAILY   sennosides-docusate sodium (SENOKOT-S) 8.6-50 MG tablet Take 1 tablet by mouth as needed for constipation.   traMADol (ULTRAM) 50 MG tablet Take 1-2 tablets (50-100 mg total) by mouth every 6 (six) hours as needed for moderate pain.   venlafaxine XR (EFFEXOR-XR) 150 MG 24 hr capsule TAKE 1 CAPSULE BY MOUTH EVERY NIGHT AT BEDTIME - office visit before next refill   vitamin C (VITAMIN C) 500 MG tablet Take 1 tablet (500 mg total) by mouth 2 (two) times daily.   zinc sulfate 220 (50 Zn) MG capsule Take 1 capsule (220 mg total) by mouth daily.     Allergies:   Sulfa drugs cross reactors   Social History   Socioeconomic History   Marital status: Married    Spouse name: Not on file   Number of children: 4   Years of education: 4   Highest education level: Not on file  Occupational History    Comment: retired  Ecologist strain: Not on file   Food insecurity    Worry: Not on file    Inability: Not on Occupational hygienist needs    Medical: Not on file     Non-medical: Not on file  Tobacco Use   Smoking status: Never Smoker   Smokeless tobacco: Never Used  Substance and Sexual Activity   Alcohol use: No   Drug use: No   Sexual activity: Never  Lifestyle   Physical activity    Days per week: Not on file    Minutes per session: Not on file   Stress: Not on file  Relationships   Social connections    Talks on phone: Not on file    Gets together: Not on file    Attends religious service:  Not on file    Active member of club or organization: Not on file    Attends meetings of clubs or organizations: Not on file    Relationship status: Not on file  Other Topics Concern   Not on file  Social History Narrative   Separated. 4 children from first marriage. 16 grandkids.       Retired from [employer] for 25 years, went to [employer] for 5 years. Retired 2003 after MI      Hobbies: babysit/active with children      Right-handed      Caffeine: 24 oz soda per day        Family History: The patient's family history includes CAD in her brother and father; Diabetes in her brother, father, mother, and sister; Thyroid disease in her sister.  ROS:   Please see the history of present illness.    All other systems reviewed and are negative.  EKGs/Labs/Other Studies Reviewed:    The following studies were reviewed today:  Cardiac Catheterization 05/26/2017:  Prox RCA to Mid RCA lesion, 10 %stenosed.  Dist Cx lesion, 20 %stenosed.  Mid LAD lesion, 10 %stenosed.  There is no aortic valve stenosis.  Ost 1st Mrg lesion, 25 %stenosed.  The left ventricular systolic function is normal.  LV end diastolic pressure is normal.  The left ventricular ejection fraction is 55-65% by visual estimate.   No obstructive CAD.  Likely demand ischemia. Continue preventive therapy. _______________  Echocardiogram 08/07/2019: Impressions: 1. Left ventricular ejection fraction, by visual estimation, is 35 to 40%. The left ventricle has  moderately decreased function. Normal left ventricular size. There is mildly increased left ventricular hypertrophy.  2. Elevated mean left atrial pressure.  3. Left ventricular diastolic Doppler parameters are consistent with pseudonormalization pattern of LV diastolic filling.  4. Global right ventricle has normal systolic function.The right ventricular size is normal.  5. Left atrial size was severely dilated.  6. Right atrial size was normal.  7. The mitral valve is normal in structure. Moderate mitral valve regurgitation. No evidence of mitral stenosis.  8. The tricuspid valve is normal in structure. Tricuspid valve regurgitation is mild.  9. The aortic valve is tricuspid Aortic valve regurgitation was not visualized by color flow Doppler. Mild aortic valve sclerosis without stenosis. 10. The pulmonic valve was normal in structure. Pulmonic valve regurgitation is trivial by color flow Doppler. 11. The inferior vena cava is normal in size with greater than 50% respiratory variability, suggesting right atrial pressure of 3 mmHg. 12. Moderate global reduction in LV systolic function; mild LVH; grade 2 diastolic dysfunction; moderate MR; severe LAE.  EKG:  EKG not ordered today.   Recent Labs: 08/05/2019: TSH 3.101 08/09/2019: B Natriuretic Peptide 386.1 08/14/2019: Magnesium 2.6 08/21/2019: ALT 62; BUN 24; Creatinine, Ser 1.05; Hemoglobin 11.9; Platelets 348.0; Potassium 4.3; Sodium 137  Recent Lipid Panel    Component Value Date/Time   CHOL 155 12/30/2018 0802   TRIG 56.0 12/30/2018 0802   HDL 51.40 12/30/2018 0802   CHOLHDL 3 12/30/2018 0802   VLDL 11.2 12/30/2018 0802   LDLCALC 92 12/30/2018 0802    Physical Exam:    Vital Signs: BP 140/72    Pulse 78    Ht  (1.676 m)    Wt 131 lb 12.8 oz (59.8 kg)    SpO2 99%    BMI 21.27 kg/m     Wt Readings from Last 3 Encounters:  09/04/19 131 lb 12.8 oz (59.8 kg)  08/06/19 133 lb 2.5 oz (60.4 kg)  12/26/18 137 lb (62.1 kg)      General: 73 y.o. African-American female in no acute distress. HEENT: Normocephalic and atraumatic. Sclera clear. EOMs intact. Neck: Supple. No carotid bruits. No JVD. Heart: RRR. Distinct S1 and S2. No murmurs, gallops, or rubs. Radial pulses 2+ and equal bilaterally. Lungs: No increased work of breathing. Clear to ausculation bilaterally. No wheezes, rhonchi, or rales.  Abdomen: Soft, non-distended, and non-tender to palpation. Bowel sounds present. MSK: Normal strength and tone for age. Extremities: No lower extremities edema.    Skin: Warm and dry. Neuro: Alert and oriented x3. No focal deficits. Psych: Normal affect. Responds appropriately.   Assessment:    1. Coronary artery disease involving native coronary artery of native heart without angina pectoris   2. Demand ischemia (HCC)   3. Chronic combined systolic and diastolic CHF (congestive heart failure) (HCC)   4. Essential hypertension   5. Hyperlipidemia, unspecified hyperlipidemia type     Plan:    Non-Obstructive CAD with Coronary Spasms / Demand Ischemia - Non-obstructive CAD noted on most recent cardiac catheterization in 05/2017.  - High-sensitivity troponin minimally elevated during recent hospitalization. Peaked at 25. Felt to be due to demand ischemia in setting of COVID-19.  - Patient reports few episodes of brief atypical chest pain a few days after discharge but none since. Similar to pain she has had in the past. - Continue current medications: Aspirin  daily, Toprol-XL 37.5mg  daily, Amlodipine  daily. - Will restart Imdur at lower dose  daily (previously on  daily).  - May need to consider repeat ischemic evaluation if EF is still low on repeat Echo in 3 months.  Chronic Combined CHF with Newly Reduced EF - Echo during recent hospitalization showed newly reduced EF of 35-40% with grade 2 diastolic dysfunction. EF down from 50-55% from 09/2017. Possibly due to COVID-19. - Patient appears  euvolemic on exam. No need for diuretics at this time. - Continue Toprol-XL 37.5mg  daily. - Will add Losartan  daily. - Will see patient back in a couple of weeks to titrate meds. - Given minimal disease on most recent cath about 2 years, do not suspect severe CAD. Will repeat Echo in 3 months. If EF has not improved at that time, may need to consider repeat ischemic evaluation.   Hypertension - BP mildly elevated at 140/72. - Continue Amlodipine  daily and Toprol-XL 37.5mg  daily. - Will add Losartan as above. - Asked patient to monitor BP at home over the next 2 weeks and notify of if systolic BP 100 or lower or if she has symptoms of hypotension. - Will recheck BMET at follow up in 2 weeks.  Hyperlipidemia - LDL 92 in 12/2018. LDL goal <70 given CAD. - Lipitor held during recent admission. ALT mildly elevated at 62 on most recent check on 08/21/2019 but other LFTs normal.  - Continue Zetia  daily. - PCP planning to recheck LFTs in 1 month. If stable, consider restarting statin at that time.  Disposition: Follow up with me in 2 weeks. Will also go ahead and arrange 3 month follow-up with Dr. Katrinka Blazing after Echo.   Medication Adjustments/Labs and Tests Ordered: Current medicines are reviewed at length with the patient today.  Concerns regarding medicines are outlined above.  Orders Placed This Encounter  Procedures   Basic Metabolic Panel (BMET)   ECHOCARDIOGRAM COMPLETE   Meds ordered this encounter  Medications   losartan (COZAAR) 25 MG tablet  Sig: Take 1 tablet (25 mg total) by mouth daily.    Dispense:  30 tablet    Refill:  11   isosorbide mononitrate (IMDUR) 30 MG 24 hr tablet    Sig: Take 1 tablet (30 mg total) by mouth daily.    Dispense:  90 tablet    Refill:  3    Patient Instructions  Medication Instructions:  Your physician has recommended you make the following change in your medication:  1. Start losartan one tablet (25 mg) daily sent in for #30  to requested pharmacy.  2. Restart imdur one half tablet (30 mg ) daily. Pt has 60 mg at home.   *If you need a refill on your cardiac medications before your next appointment, please call your pharmacy*  If you have labs (blood work) drawn today and your tests are completely normal, you will receive your results only by:  [place] (if you have MyChart) OR  A paper copy in the mail If you have any lab test that is abnormal or we need to change your treatment, we will call you to review the results.  Testing/Procedures: Your physician has requested that you have an echocardiogram. Echocardiography is a painless test that uses sound waves to create images of your heart. It provides your doctor with information about the size and shape of your heart and how well your hearts chambers and valves are working. This procedure takes approximately one hour. There are no restrictions for this procedure.    Follow-Up:  Your physician recommends that you keep your scheduled  follow-up appointment with Sande Rives, PA on November 24.  Your physician recommends that you keep your scheduled  follow-up appointment with Dr. Tamala Julian on February 19.  At [facility], you and your health needs are our priority.  As part of our continuing mission to provide you with exceptional heart care, we have created designated Provider Care Teams.  These Care Teams include your primary Cardiologist (physician) and Advanced Practice Providers (APPs -  Physician Assistants and Nurse Practitioners) who all work together to provide you with the care you need, when you need it.  Your next appointment:   3 months  The format for your next appointment:   In Person          Signed, Eppie Gibson  09/04/2019 10:39 AM    [place]

## 2019-09-04 ENCOUNTER — Other Ambulatory Visit: Payer: Self-pay

## 2019-09-04 ENCOUNTER — Ambulatory Visit (INDEPENDENT_AMBULATORY_CARE_PROVIDER_SITE_OTHER): Payer: Medicare Other | Admitting: Student

## 2019-09-04 ENCOUNTER — Ambulatory Visit (INDEPENDENT_AMBULATORY_CARE_PROVIDER_SITE_OTHER)
Admission: RE | Admit: 2019-09-04 | Discharge: 2019-09-04 | Disposition: A | Discharge status: Home / Self Care | Payer: Medicare Other | Source: Ambulatory Visit | Attending: Family Medicine | Admitting: Family Medicine

## 2019-09-04 ENCOUNTER — Encounter: Payer: Self-pay | Admitting: Student

## 2019-09-04 VITALS — BP 140/72 | HR 78 | Ht 66.0 in | Wt 131.8 lb

## 2019-09-04 DIAGNOSIS — I10 Essential (primary) hypertension: Secondary | ICD-10-CM | POA: Diagnosis not present

## 2019-09-04 DIAGNOSIS — E785 Hyperlipidemia, unspecified: Secondary | ICD-10-CM | POA: Diagnosis not present

## 2019-09-04 DIAGNOSIS — R911 Solitary pulmonary nodule: Secondary | ICD-10-CM

## 2019-09-04 DIAGNOSIS — I248 Other forms of acute ischemic heart disease: Secondary | ICD-10-CM

## 2019-09-04 DIAGNOSIS — I2510 Atherosclerotic heart disease of native coronary artery without angina pectoris: Secondary | ICD-10-CM

## 2019-09-04 DIAGNOSIS — I5042 Chronic combined systolic (congestive) and diastolic (congestive) heart failure: Secondary | ICD-10-CM

## 2019-09-04 MED ORDER — ISOSORBIDE MONONITRATE ER 30 MG PO TB24: 30.0000 mg | ORAL_TABLET | Freq: Every day | ORAL | 3 refills | Status: DC

## 2019-09-04 MED ORDER — LOSARTAN POTASSIUM 25 MG PO TABS: 25.0000 mg | ORAL_TABLET | Freq: Every day | ORAL | 11 refills | Status: DC

## 2019-09-04 NOTE — Progress Notes (Signed)
There is a stable 4 mm nodule in the lungs-this is very small and does not require further monitoring at present.  There was a little bit of opacification in the right lung-if you develop worsening shortness of breath please let us know as this could be a possible sign of pneumonia but could also be where your prior infection is still healing.  You have plaque on your aorta which is a very common finding for patients over age 72-we need to continue to try to control cholesterol, blood pressure and any other risk factors

## 2019-09-04 NOTE — Patient Instructions (Signed)
Medication Instructions:  Your physician has recommended you make the following change in your medication:  1. Start losartan one tablet (25 mg) daily sent in for #30 to requested pharmacy.  2. Restart imdur one half tablet (30 mg ) daily. Pt has 60 mg at home.   *If you need a refill on your cardiac medications before your next appointment, please call your pharmacy*  If you have labs (blood work) drawn today and your tests are completely normal, you will receive your results only by: [place] MyChart Message (if you have MyChart) OR . A paper copy in the mail If you have any lab test that is abnormal or we need to change your treatment, we will call you to review the results.  Testing/Procedures: Your physician has requested that you have an echocardiogram. Echocardiography is a painless test that uses sound waves to create images of your heart. It provides your doctor with information about the size and shape of your heart and how well your heart's chambers and valves are working. This procedure takes approximately one hour. There are no restrictions for this procedure.    Follow-Up:  Your physician recommends that you keep your scheduled  follow-up appointment with Sande Rives, PA on November 24.  Your physician recommends that you keep your scheduled  follow-up appointment with Dr. Tamala Julian on February 19.  At [facility], you and your health needs are our priority.  As part of our continuing mission to provide you with exceptional heart care, we have created designated Provider Care Teams.  These Care Teams include your primary Cardiologist (physician) and Advanced Practice Providers (APPs -  Physician Assistants and Nurse Practitioners) who all work together to provide you with the care you need, when you need it.  Your next appointment:   3 months  The format for your next appointment:   In Person

## 2019-09-08 ENCOUNTER — Other Ambulatory Visit: Payer: Self-pay | Admitting: Family Medicine

## 2019-09-10 ENCOUNTER — Other Ambulatory Visit: Payer: Self-pay | Admitting: Family Medicine

## 2019-09-11 ENCOUNTER — Other Ambulatory Visit: Payer: Medicare Other

## 2019-09-11 NOTE — Telephone Encounter (Signed)
Patient had losartan 25 mg started by cardiology-she should not be on lisinopril and losartan at the same time-have her continue losartan 25 mg

## 2019-09-11 NOTE — Telephone Encounter (Signed)
Not sure if patient should get refill. Was removed at hospital and seen by cardiology after that. Do not see in note where patient should have started back on it.

## 2019-09-13 ENCOUNTER — Other Ambulatory Visit: Payer: Self-pay

## 2019-09-15 ENCOUNTER — Encounter: Payer: Self-pay | Admitting: Family Medicine

## 2019-09-15 ENCOUNTER — Ambulatory Visit: Payer: Medicare Other | Admitting: Family Medicine

## 2019-09-15 NOTE — Progress Notes (Deleted)
Phone [phone] In person visit   Subjective:   [name] is a 73 y.o. year old very pleasant female patient who presents for/with See problem oriented charting No chief complaint on file.   ROS- ***   This visit occurred during the SARS-CoV-2 public health emergency.  Safety protocols were in place, including screening questions prior to the visit, additional usage of staff PPE, and extensive cleaning of exam room while observing appropriate contact time as indicated for disinfecting solutions.   Past Medical History-  Patient Active Problem List   Diagnosis Date Noted   Heart failure with reduced ejection fraction (HCC) 08/21/2019    Priority: High   COVID-19 08/07/2019    Priority: High   Hyperthyroidism 05/22/2017    Priority: High   Coronary artery disease involving native coronary artery of native heart with angina pectoris (HCC)     Priority: High   Seizures (HCC)     Priority: High   Demand ischemia (HCC)     Priority: Medium   Protein-calorie malnutrition, moderate (HCC) 05/22/2017    Priority: Medium   Overactive bladder 09/19/2014    Priority: Medium   Hypercholesteremia     Priority: Medium   GERD (gastroesophageal reflux disease) 10/04/2011    Priority: Medium   HTN (hypertension) 10/01/2011    Priority: Medium   Arthritis     Priority: Low   Anemia 12/22/2017    Medications- reviewed and updated Current Outpatient Medications  Medication Sig Dispense Refill   amLODipine (NORVASC) 5 MG tablet Take 1 tablet (5 mg total) by mouth daily. 30 tablet 0   aspirin EC 81 MG tablet Take 1 tablet (81 mg total) by mouth daily. 30 tablet 1   B Complex Vitamins (VITAMIN B COMPLEX) TABS Take 1 tablet by mouth daily.     cholecalciferol (VITAMIN D) 400 UNITS TABS tablet Take 400 Units daily by mouth.      esomeprazole (NEXIUM) 40 MG capsule TAKE 1 CAPSULE(40 MG) BY MOUTH DAILY 90 capsule 2   ezetimibe (ZETIA) 10 MG tablet TAKE 1 TABLET(10 MG)  BY MOUTH DAILY 90 tablet 2   Ferrous Sulfate Dried (EQ SLOW-RELEASE IRON) 45 MG TBCR Take 1 tablet by mouth daily. 90 tablet 1   ipratropium (ATROVENT HFA) 17 MCG/ACT inhaler Inhale 2 puffs into the lungs every 8 (eight) hours. 1 Inhaler 12   isosorbide mononitrate (IMDUR) 30 MG 24 hr tablet Take 1 tablet (30 mg total) by mouth daily. 90 tablet 3   levalbuterol (XOPENEX HFA) 45 MCG/ACT inhaler Inhale 2 puffs into the lungs every 8 (eight) hours. 1 Inhaler 12   loperamide (IMODIUM) 2 MG capsule Take 1 capsule (2 mg total) by mouth as needed for diarrhea or loose stools. 30 capsule 0   losartan (COZAAR) 25 MG tablet Take 1 tablet (25 mg total) by mouth daily. 30 tablet 11   methimazole (TAPAZOLE) 10 MG tablet TAKE 4 TABLETS BY MOUTH TWICE DAILY 240 tablet 0   metoprolol succinate (TOPROL-XL) 25 MG 24 hr tablet Take 1.5 tablets (37.5 mg total) by mouth daily. 135 tablet 1   nitroGLYCERIN (NITROSTAT) 0.4 MG SL tablet PLACE 1 TABLET UNDER THE TONGUE EVERY 5 MINUTES FOR CHEST PAIN FOR 3 DOSES 25 tablet 0   oxybutynin (DITROPAN-XL) 10 MG 24 hr tablet TAKE 1 TABLET BY MOUTH ONCE DAILY 90 tablet 3   oxyCODONE-acetaminophen (PERCOCET) 10-325 MG tablet Take 1 tablet by mouth 2 (two) times daily as needed.     potassium chloride (MICRO-K)  10 MEQ CR capsule TAKE 1 CAPSULE BY MOUTH TWICE DAILY 180 capsule 1   sennosides-docusate sodium (SENOKOT-S) 8.6-50 MG tablet Take 1 tablet by mouth as needed for constipation.     traMADol (ULTRAM) 50 MG tablet Take 1-2 tablets (50-100 mg total) by mouth every 6 (six) hours as needed for moderate pain. 30 tablet 0   venlafaxine XR (EFFEXOR-XR) 150 MG 24 hr capsule TAKE 1 CAPSULE BY MOUTH EVERY NIGHT AT BEDTIME - office visit before next refill 30 capsule 5   vitamin C (VITAMIN C) 500 MG tablet Take 1 tablet (500 mg total) by mouth 2 (two) times daily. 14 tablet 0   zinc sulfate 220 (50 Zn) MG capsule Take 1 capsule (220 mg total) by mouth daily. 7 capsule 0     No current facility-administered medications for this visit.      Objective:  There were no vitals taken for this visit. Gen: NAD, resting comfortably CV: RRR no murmurs rubs or gallops Lungs: CTAB no crackles, wheeze, rhonchi Abdomen: soft/nontender/nondistended/normal bowel sounds. No rebound or guarding.  Ext: no edema Skin: warm, dry Neuro: grossly normal, moves all extremities  ***    Assessment and Plan   never had AWV *** last cpe ***  #Lab abnormalities after COVID-19/follow-up last visit S: Patient has been hospitalized in October for COVID-19-also had demand ischemia leading to CHF exacerbation at that time-EF was down to 35%.  She was diuresed in the hospital and did not require ongoing Lasix.  Patient was working with physical therapy at last visit.  She also had an E. coli UTI at that time-no recurrent symptoms at this time***.  Patient also had a repeat CT scan which showed a stable very small nodule.  She also had a faint hazy density in the right lung which was thought to be atelectasis***.  Aortic atherosclerosis was also incidentally noted which is consistent with CAD history***   Patient had mild anemia at last visit.  Patient also had low albumin and slightly high ALT.  She had a slightly high BUN to creatinine ratio A/P: ***  -Update CBC to evaluate for anemia -Repeat CMP to evaluate albumin and ALT.  Also evaluate BUN to creatinine ratio    Recommended follow up: ***No follow-ups on file. Future Appointments  Date Time Provider [place]  09/19/2019 11:00 AM Leanor Kail, [place] [facility]  12/05/2019 11:30 AM [id number]  12/15/2019 11:00 AM Belva Crome, [place] [facility]    Lab/Order associations: No diagnosis found.  No orders of the defined types were placed in this encounter.   Return precautions advised.  Garret Reddish, MD ED

## 2019-09-17 NOTE — Progress Notes (Signed)
Cardiology Office Note:    Date:  09/19/2019   ID:  Karma Ansley, DOB 1946-04-09, MRN [id number]  PCP:  Marin Olp, MD  Cardiologist:  Sinclair Grooms, MD  Electrophysiologist:  None   Referring MD: Marin Olp, MD   Chief Complaint: follow-up of chronic combined CHF for medication titration  History of Present Illness:    [name] is a 73 y.o. female with a history of non-obstructive CAD on cardiac cath in 2018, coronary spasm noted on cardiac cath in 2202, chronic diastolic CHF, hypertension, hyperlipidemia, Grave's disease with multinodular goiter (followed by Dr. Loanne Drilling), GERD, and chronic lower back pain who is followed by Dr. Tamala Julian  Patient has had multiple cardiac catheterization in the past showing non-obstructive CAD. On cardiac catheterization in 2003, she was noted to intense diffuse left and right coronary spasms relieved with intracoronary nitroglycerin. She was started on oral long-acting nitrates at that time. Patient was admitted in July/August 2018 for subacute thyroiditis and NSTEMI. Echo showed LVEF of 55% with mild hypokinesis of the basalinferior myocardium and grade 2 diastolic dysfunciton. Stress test at that time was considered intermediate risk and showed small defect in apical septal location and medium defect in the basal inferior and inferior location. She underwent left heart catheterization in 05/2017 which showed mild non-obstructive CAD with 10% stenosis of the proximal to mid RCA, 20% stenosis of the distal CX, 10% stenosis of the mid LAD, and 25% stenosis of OM1.  Patient recently admitted from 10/10/202 to 08/14/2019 after presenting with multiple complaints including chest pain, back pain, headache, and decreased appetite. Patient thought this was due to her thyroid as she has had similar symptoms in the past with thyroiditis. High-sensitivity troponin was minimally elevated and relatively flat at 54 >> 54 >> 61 >> 86 >> 77. EKG showed  slightly more pronounced ST depressions in inferior and lateral leads. Patient reported not taking home diuretics and initial BNP elevated at 2,030.  Echo showed LVEF of 35-40% with mild LVH, grade 2 diastolic dysfunction, andelevated mean left atrial pressure as well as severely dilated left atrium and moderate MR. She was given 1 dose of IV Lasix in the ED with bump in creatinine and improved BNP  to 203 the next day. She ended up testing positive for COVID-19 which was felt to be the cause of all her symptoms. She was treated with a Decadron taper. Cardiology was consulted and elevated troponin felt to be due to demand ischemia and no further ischemic work-up was planned. Cardiac medications prior to admission included Aspirin 324m every other day, Amlodipine 166mdaily, Lisinopril 2016maily, Propranolol 15m48mice daily,  Imdur 15mg72mmg 39my, Lipitor 20mg d59m, Zetia 10mg da59m Lisinopril held on admission to allow for diuresis. Patient was initially hypertensive but then became borderline hypotensive with systolic BP as low as the 90's at times so other BP medications were briefly held. Home Amlodipine was eventually restarted at lower dose and home Propranolol was switched to Toprol-XL given reduced EF. Imdur was never restarted. Hospitalization complicated by AKI with creatinine peaking at 2.65. Renal function improved with hydration. Creatinine 1.03 at discharge. However, Lisinopril was not restarted. Also noted to have thrombocytopenia during admission with platelets as low as 104. Patient complained of some dysuria and found to have E.Coli UTI. She was treated with antibiotics. She also complained of nausea during admission which was felt to be due to constipation. Abdominal X-ray on 10/18 showed moderate colonic stool burden without  evidence of enteric obstruction.  Patient was initially felt to be stable for discharge on 10/13 but patient and family felt very concerned and uncomfortable with this  and appealed the discharge. He ended up staying several more days and was discharged on 10/19. Patient discharged on Aspirin 65m daily, Amlodipine 589mdaily, Toprol-XL 251maily.  Patient was seen by me on 09/04/2019 at which time she was doing well. She reported a couple of episodes of chest pain a few days after discharge but none since as well as improvement in shortness of breath. Patient was restarted on Imdur 54m61mily and Losartan 25mg5mly was added. Patient was instructed to follow-up in 2 weeks for further med titration.   Patient presents today for follow-up.  Patient reports intermittent episodes of sharp substernal chest discomfort that lasts for about 1 minute and resolves spontaneously.  Patient has difficulty but does emphasize that it is not pain chest discomfort.  She states pain does not last long enough for anything to make it better or worse.  Discomfort is mainly at rest as patient states she really has not done much since being discharged in the hospital because she is very easily "drained."  She denies any associated symptoms with this chest discomfort including shortness of breath, nausea, vomiting, diaphoresis.  No palpitations.  However she does report some lightheadedness and dizziness that comes and goes.  She has she again has difficulty elaborating on this but denies any falls or syncope.  She has stable 2-3 pillow orthopnea she states she has had for over 20 years.  She occasionally has a little swelling in her legs which seems to improve with elevating them.  No PND.  She does not weigh herself daily but weight is up about 7 pounds from last visit.  She also notes a nonproductive cough for the last week or 2 that she states feels is starting in her throat not her chest.  She also notes occasional episodes of left arm pain which has been going on for several months.  During 1 episode she said she could not move her hand for 3 hours and hand was numb. She states the episodes  have improved since recent hospitalization.   Much of visit today spent clarifying medications.  Patient brought medications with her to visit and I personally went through all of her cardiac meds with her.  It sounds like she has been taking 10 mg of amlodipine rather than the recommended 5 mg.  It also sounds like for the last 1.5 to 2  weeks she has been taking Lisinopril (which was stopped during recent admission) in addition to her Losartan which was added at last visit.   Past Medical History:  Diagnosis Date   Arthritis    Chronic lower back pain    Cluster headache    Coronary artery disease    a. 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 non obs dz LHC 2007. c. Non obs dz (mild-mod) by [facility] d. cath 05/26/17 mild non obstructive CAD   Diverticulitis    a. Hx microperf 2012 - hospitalizated.   GERD (gastroesophageal reflux disease)    Hypercholesteremia    Hypertension    PVC's (premature ventricular contractions)    a. Hx of trigeminy/bigeminy.   Seizures (HCC) [place]    hyperthyroid   UTI (lower urinary tract infection)     Past Surgical History:  Procedure Laterality Date   ABDOMINAL HYSTERECTOMY  1987   "partial"-still has  ovaries   CARDIAC CATHETERIZATION  05/17/2014   CORONARY ANGIOPLASTY WITH STENT PLACEMENT  1995   "1"   JOINT REPLACEMENT     right knee   LEFT HEART CATH AND CORONARY ANGIOGRAPHY N/A 05/26/2017   Procedure: Left Heart Cath and Coronary Angiography;  Surgeon: Jettie Booze, MD;  Location: [place] CV LAB;  Service: Cardiovascular;  Laterality: N/A;   LEFT HEART CATHETERIZATION WITH CORONARY ANGIOGRAM N/A 05/17/2014   Procedure: LEFT HEART CATHETERIZATION WITH CORONARY ANGIOGRAM;  Surgeon: Leonie Man, MD;  Location: [facility] CATH LAB;  Service: Cardiovascular;  Laterality: N/A;   TOTAL KNEE ARTHROPLASTY Right 2005   TUBAL LIGATION  1970   VASCULAR SURGERY      Current Medications: Current Meds  Medication Sig    amLODipine (NORVASC) 5 MG tablet Take 5 mg by mouth daily.   aspirin EC 81 MG tablet Take 1 tablet (81 mg total) by mouth daily.   atorvastatin (LIPITOR) 20 MG tablet Take 20 mg by mouth daily at 6 PM.   B Complex Vitamins (VITAMIN B COMPLEX) TABS Take 1 tablet by mouth daily.   cholecalciferol (VITAMIN D) 400 UNITS TABS tablet Take 400 Units daily by mouth.    esomeprazole (NEXIUM) 40 MG capsule TAKE 1 CAPSULE(40 MG) BY MOUTH DAILY   ezetimibe (ZETIA) 10 MG tablet TAKE 1 TABLET(10 MG) BY MOUTH DAILY   Ferrous Sulfate Dried (EQ SLOW-RELEASE IRON) 45 MG TBCR Take 1 tablet by mouth daily.   ipratropium (ATROVENT HFA) 17 MCG/ACT inhaler Inhale 2 puffs into the lungs every 8 (eight) hours.   isosorbide mononitrate (IMDUR) 30 MG 24 hr tablet Take 1 tablet (30 mg total) by mouth daily.   levalbuterol (XOPENEX HFA) 45 MCG/ACT inhaler Inhale 2 puffs into the lungs every 8 (eight) hours.   loperamide (IMODIUM) 2 MG capsule Take 1 capsule (2 mg total) by mouth as needed for diarrhea or loose stools.   losartan (COZAAR) 25 MG tablet Take 25 mg by mouth daily.   methimazole (TAPAZOLE) 10 MG tablet TAKE 4 TABLETS BY MOUTH TWICE DAILY   metoprolol succinate (TOPROL-XL) 25 MG 24 hr tablet Take 1.5 tablets (37.5 mg total) by mouth daily.   nitroGLYCERIN (NITROSTAT) 0.4 MG SL tablet PLACE 1 TABLET UNDER THE TONGUE EVERY 5 MINUTES FOR CHEST PAIN FOR 3 DOSES   oxybutynin (DITROPAN-XL) 10 MG 24 hr tablet TAKE 1 TABLET BY MOUTH ONCE DAILY   oxyCODONE-acetaminophen (PERCOCET) 10-325 MG tablet Take 1 tablet by mouth 2 (two) times daily as needed.   potassium chloride (MICRO-K) 10 MEQ CR capsule TAKE 1 CAPSULE BY MOUTH TWICE DAILY   sennosides-docusate sodium (SENOKOT-S) 8.6-50 MG tablet Take 1 tablet by mouth as needed for constipation.   traMADol (ULTRAM) 50 MG tablet Take 1-2 tablets (50-100 mg total) by mouth every 6 (six) hours as needed for moderate pain.   venlafaxine XR (EFFEXOR-XR) 150 MG  24 hr capsule TAKE 1 CAPSULE BY MOUTH EVERY NIGHT AT BEDTIME - office visit before next refill   vitamin C (VITAMIN C) 500 MG tablet Take 1 tablet (500 mg total) by mouth 2 (two) times daily.   zinc sulfate 220 (50 Zn) MG capsule Take 1 capsule (220 mg total) by mouth daily.   [DISCONTINUED] lisinopril (ZESTRIL) 20 MG tablet Take 20 mg by mouth daily.     Allergies:   Sulfa drugs cross reactors   Social History   Socioeconomic History   Marital status: Married    Spouse name: Not on  file   Number of children: 4   Years of education: 4   Highest education level: Not on file  Occupational History    Comment: retired  Scientist, product/process development strain: Not on file   Food insecurity    Worry: Not on file    Inability: Not on Lexicographer needs    Medical: Not on file    Non-medical: Not on file  Tobacco Use   Smoking status: Never Smoker   Smokeless tobacco: Never Used  Substance and Sexual Activity   Alcohol use: No   Drug use: No   Sexual activity: Never  Lifestyle   Physical activity    Days per week: Not on file    Minutes per session: Not on file   Stress: Not on file  Relationships   Social connections    Talks on phone: Not on file    Gets together: Not on file    Attends religious service: Not on file    Active member of club or organization: Not on file    Attends meetings of clubs or organizations: Not on file    Relationship status: Not on file  Other Topics Concern   Not on file  Social History Narrative   Separated. 4 children from first marriage. 16 grandkids.       Retired from [employer] for 25 years, went to [employer] for 5 years. Retired 2003 after MI      Hobbies: babysit/active with children      Right-handed      Caffeine: 24 oz soda per day        Family History: The patient's family history includes CAD in her brother and father; Diabetes in her brother, father, mother, and sister; Thyroid disease in her  sister.  ROS:   Please see the history of present illness.    All other systems reviewed and are negative.  EKGs/Labs/Other Studies Reviewed:    The following studies were reviewed today:  Cardiac Catheterization 05/26/2017:  Prox RCA to Mid RCA lesion, 10 %stenosed.  Dist Cx lesion, 20 %stenosed.  Mid LAD lesion, 10 %stenosed.  There is no aortic valve stenosis.  Ost 1st Mrg lesion, 25 %stenosed.  The left ventricular systolic function is normal.  LV end diastolic pressure is normal.  The left ventricular ejection fraction is 55-65% by visual estimate.  No obstructive CAD. Likely demand ischemia. Continue preventive therapy. _______________  Echocardiogram 08/07/2019: Impressions: 1. Left ventricular ejection fraction, by visual estimation, is 35 to 40%. The left ventricle has moderately decreased function. Normal left ventricular size. There is mildly increased left ventricular hypertrophy. 2. Elevated mean left atrial pressure. 3. Left ventricular diastolic Doppler parameters are consistent with pseudonormalization pattern of LV diastolic filling. 4. Global right ventricle has normal systolic function.The right ventricular size is normal. 5. Left atrial size was severely dilated. 6. Right atrial size was normal. 7. The mitral valve is normal in structure. Moderate mitral valve regurgitation. No evidence of mitral stenosis. 8. The tricuspid valve is normal in structure. Tricuspid valve regurgitation is mild. 9. The aortic valve is tricuspid Aortic valve regurgitation was not visualized by color flow Doppler. Mild aortic valve sclerosis without stenosis. 10. The pulmonic valve was normal in structure. Pulmonic valve regurgitation is trivial by color flow Doppler. 11. The inferior vena cava is normal in size with greater than 50% respiratory variability, suggesting right atrial pressure of 3 mmHg. 12. Moderate global reduction in LV systolic  function; mild LVH;  grade 2 diastolic dysfunction; moderate MR; severe LAE.  EKG:  EKG not ordered today.  Recent Labs: 08/05/2019: TSH 3.101 08/09/2019: B Natriuretic Peptide 386.1 08/14/2019: Magnesium 2.6 08/21/2019: ALT 62; BUN 24; Creatinine, Ser 1.05; Hemoglobin 11.9; Platelets 348.0; Potassium 4.3; Sodium 137  Recent Lipid Panel    Component Value Date/Time   CHOL 155 12/30/2018 0802   TRIG 56.0 12/30/2018 0802   HDL 51.40 12/30/2018 0802   CHOLHDL 3 12/30/2018 0802   VLDL 11.2 12/30/2018 0802   LDLCALC 92 12/30/2018 0802    Physical Exam:    Vital Signs: BP 122/68    Pulse 70    Ht 5' 6"  (1.676 m)    Wt 137 lb 12.8 oz (62.5 kg)    SpO2 96%    BMI 22.24 kg/m     Wt Readings from Last 3 Encounters:  09/19/19 137 lb 12.8 oz (62.5 kg)  09/04/19 131 lb 12.8 oz (59.8 kg)  08/06/19 133 lb 2.5 oz (60.4 kg)     General: 73 y.o. female in no acute distress. HEENT: Normocephalic and atraumatic. Sclera clear. EOMs intact. Neck: Supple.  No JVD. Heart: RRR. Distinct S1 and S2. No murmurs, gallops, or rubs. Radial pulses 2+ and equal bilaterally. Lungs: No increased work of breathing. Clear to ausculation bilaterally. No wheezes, rhonchi, or rales.  Abdomen: Soft, non-distended, and non-tender to palpation. Bowel sounds present in all 4 quadrants.  MSK: Normal strength and tone for age. Extremities: No clubbing, cyanosis, or edema.    Skin: Warm and dry. Neuro: Alert and oriented x3. No focal deficits. Psych: Normal affect. Responds appropriately.   Assessment:    1. Coronary artery disease involving native coronary artery of native heart without angina pectoris   2. Chronic combined systolic and diastolic heart failure ([place])   3. Essential hypertension   4. Hyperlipidemia, unspecified hyperlipidemia type   5. Left arm pain     Plan:    Non-Obstructive CAD with History of Coronary Spasms - Non-obstructive CAD noted on most recent cardiac catheterization in 05/2017.  - Patient continues  to report occasional episodes of atypical chest pain lasting less than a minute and resolving spontaneously. - Continue current medications: Aspirin 67m daily, Toprol-XL 37.566mdaily, Amlodipine 36m74maily, Imdur 78m72mily.  - Patient advised to call our office or 911 with any new or worsening chest pain that does not resolve on its own.  Chronic Combined CHF  - Echo during recent hospitalization showed newly reduced EF of 35-40% with grade 2 diastolic dysfunction. EF down from 50-55% from 09/2017. Possibly due to COVID-19. - Patient appears euvolemic on exam but weight is up about 7 pounds from visit 2 weeks ago.  - Continue Toprol-XL 37.36mg 68mly. - Continue Losartan 236mg 40my.  - Stop Lisinopril (patient restarted this on her own sometime within the last 2 weeks and has been taking this in addition to her Losartan). - Given minimal disease on most recent cath about 2 years, do not suspect severe CAD. Will repeat Echo in 3 months. If EF has not improved at that time, may need to consider repeat ischemic evaluation.   - Will recheck CMET today.  - Given significant medication confusion today, will not increase any medications. Asked patient to keep home BP and heart rate log to help guide titration at next visit. Given patient is euvolemic on exam, will not add diuretic at this time but may benefit from PRN Lasix in the future. Will see  patient back in 3 weeks for further med titration.    Hypertension - BP well controlled today. - Continue current medications as above.  Hyperlipidemia - LDL 92 in 12/2018. LDL goal <70 given CAD. - Lipitor held during recent admission. ALT mildly elevated at 62 on most recent check on 08/21/2019 but other LFTs normal. Sounds like patient restarted her Lipitor 54m daily on her own recently. - Continue Zetia 174mdaily.  - Will recheck liver function with CMET today. If LFTs normal, okay to continue Lipitor.  Left Arm Pain - Patient reports occasional  episodes of left arm pain for the past several months. During one episode, she states she could not move her hand for 3 hours and had numbness in her hand. She states these episodes have not been occurring as much since recent hospitalization. Patient has difficult elaborating on many of her symptoms so difficult to truly distinguish source of this pain but does not sound like anginal equivalent. Recommended patient follow-up with PCP. Continue cardiac medications as above. If patient has another long episode of left arm pain, advised to call 911.  Of note, patient had a lot of confusion about her medications today. I spent a great deal of time during visit going through all of her cardiac medications. Sometime after our last visit, patient went back to taking Amlodipine 1090maily (rather than the recommended 5mg67m above) and restarted Lisinopril 20mg44mly (which she was previously on but was discontinued during recent admission) in addition to her Losartan. I marked her medication bottle so she knew which medications to stop taking and corrected any doses that had been changed. Emphasized the importance that patient make sure she is taking only the medications on her med list provided at end of visit and only at the recommended doses. Will also fax updated med list to pharmacy with request to only do 30-day prescription going forward in order to reduce confusion given frequent medication changes recently. Will also recheck CMET today to ensure electrolytes, renal function, and liver function are okay.      Disposition: Follow up in 3 weeks with me or APP. Follow-up with Dr. SmithTamala Julian months after repeat Echo.   Medication Adjustments/Labs and Tests Ordered: Current medicines are reviewed at length with the patient today.  Concerns regarding medicines are outlined above.  Orders Placed This Encounter  Procedures   Comp Met (CMET)   No orders of the defined types were placed in this  encounter.   Patient Instructions  Medication Instructions:   *If you need a refill on your cardiac medications before your next appointment, please call your pharmacy*  Lab Work: Your physician recommends that you have lab work today-  CMET  If you have labs (blood work) drawn today and your tests are completely normal, you will receive your results only by:  MyChart Message (if you have MyChart) OR  A paper copy in the mail If you have any lab test that is abnormal or we need to change your treatment, we will call you to review the results.  Testing/Procedures: None ordered today.  Follow-Up: At [facility] and your health needs are our priority.  As part of our continuing mission to provide you with exceptional heart care, we have created designated Provider Care Teams.  These Care Teams include your primary Cardiologist (physician) and Advanced Practice Providers (APPs -  Physician Assistants and Nurse Practitioners) who all work together to provide you with the care you need,  when you need it.  Your next appointment:   In February- please reschedule appointment after echocardiogram.  The format for your next appointment:   In Person  Provider:   You may see Sinclair Grooms, MD or one of the following Advanced Practice Providers on your designated Care Team:    Truitt Merle, NP  Cecilie Kicks, NP  Kathyrn Drown, NP   Please keep a record of your blood pressure and heart rate daily.      Signed, Darreld Mclean, PA-C  09/19/2019 1:02 PM    [facility]

## 2019-09-18 ENCOUNTER — Encounter: Payer: Self-pay | Admitting: Family Medicine

## 2019-09-19 ENCOUNTER — Other Ambulatory Visit: Payer: Self-pay

## 2019-09-19 ENCOUNTER — Encounter: Payer: Self-pay | Admitting: Student

## 2019-09-19 ENCOUNTER — Ambulatory Visit: Payer: Medicare Other | Admitting: Student

## 2019-09-19 VITALS — BP 122/68 | HR 70 | Ht 66.0 in | Wt 137.8 lb

## 2019-09-19 DIAGNOSIS — I5042 Chronic combined systolic (congestive) and diastolic (congestive) heart failure: Secondary | ICD-10-CM

## 2019-09-19 DIAGNOSIS — M79602 Pain in left arm: Secondary | ICD-10-CM | POA: Diagnosis not present

## 2019-09-19 DIAGNOSIS — I2510 Atherosclerotic heart disease of native coronary artery without angina pectoris: Secondary | ICD-10-CM

## 2019-09-19 DIAGNOSIS — E785 Hyperlipidemia, unspecified: Secondary | ICD-10-CM

## 2019-09-19 DIAGNOSIS — I10 Essential (primary) hypertension: Secondary | ICD-10-CM | POA: Diagnosis not present

## 2019-09-19 MED ORDER — ATORVASTATIN CALCIUM 20 MG PO TABS: 20.0000 mg | ORAL_TABLET | Freq: Every day | ORAL | 1 refills | Status: DC

## 2019-09-19 NOTE — Patient Instructions (Addendum)
Medication Instructions:   *If you need a refill on your cardiac medications before your next appointment, please call your pharmacy*  Lab Work: Your physician recommends that you have lab work today-  CMET  If you have labs (blood work) drawn today and your tests are completely normal, you will receive your results only by: Marland Kitchen MyChart Message (if you have MyChart) OR . A paper copy in the mail If you have any lab test that is abnormal or we need to change your treatment, we will call you to review the results.  Testing/Procedures: None ordered today.  Follow-Up: At [facility], you and your health needs are our priority.  As part of our continuing mission to provide you with exceptional heart care, we have created designated Provider Care Teams.  These Care Teams include your primary Cardiologist (physician) and Advanced Practice Providers (APPs -  Physician Assistants and Nurse Practitioners) who all work together to provide you with the care you need, when you need it.  Your next appointment:   In February- please reschedule appointment after echocardiogram.  The format for your next appointment:   In Person  Provider:   You may see Sinclair Grooms, MD or one of the following Advanced Practice Providers on your designated Care Team:    Truitt Merle, NP  Cecilie Kicks, NP  Kathyrn Drown, NP   Please keep a record of your blood pressure and heart rate daily.

## 2019-09-20 ENCOUNTER — Telehealth: Payer: Self-pay | Admitting: Student

## 2019-09-20 ENCOUNTER — Other Ambulatory Visit: Payer: Self-pay | Admitting: Student

## 2019-09-20 LAB — COMPREHENSIVE METABOLIC PANEL
ALT: 21 IU/L (ref 0–32)
AST: 23 IU/L (ref 0–40)
Albumin/Globulin Ratio: 1.3 (ref 1.2–2.2)
Albumin: 3.9 g/dL (ref 3.7–4.7)
Alkaline Phosphatase: 77 IU/L (ref 39–117)
BUN/Creatinine Ratio: 16 (ref 12–28)
BUN: 18 mg/dL (ref 8–27)
Bilirubin Total: 0.4 mg/dL (ref 0.0–1.2)
CO2: 25 mmol/L (ref 20–29)
Calcium: 9.5 mg/dL (ref 8.7–10.3)
Chloride: 108 mmol/L — ABNORMAL HIGH (ref 96–106)
Creatinine, Ser: 1.1 mg/dL — ABNORMAL HIGH (ref 0.57–1.00)
GFR calc Af Amer: 58 mL/min/{1.73_m2} — ABNORMAL LOW (ref >59)
GFR calc non Af Amer: 50 mL/min/{1.73_m2} — ABNORMAL LOW (ref >59)
Globulin, Total: 3 g/dL (ref 1.5–4.5)
Glucose: 85 mg/dL (ref 65–99)
Potassium: 5 mmol/L (ref 3.5–5.2)
Sodium: 144 mmol/L (ref 134–144)
Total Protein: 6.9 g/dL (ref 6.0–8.5)

## 2019-09-20 NOTE — Progress Notes (Signed)
error 

## 2019-09-20 NOTE — Telephone Encounter (Signed)
I saw this patient yesterday in the office and ordered some labs on her. Labs did not come back to my in-basket but I see they are done (which is why I am sending a telephone note rather than a result note).  Can you please call and notify patient of her CMET results from yesterday? Creatinine slightly elevated which is a sign that her kidneys may be slightly injured. It sounded like patient had accidentally been taken Lisinopril and Losartan together for about 1 week. I emphasized that patient should not take Lisinopril and only take Losartan. Can you re-emphasize this to her?  Her potassium is on the higher end but still within normal limits. Some of this may again be due to the fact that she was taking Losartan and Lisinopril together. Will continue potassium supplementation for now because it looks like patient tends to have hypokalemia.   LFTS have returned to normal so okay for patient to continue taking Lipitor and Zetia.  Patient should keep follow-up appointment with Gae Bon on 12/17.  Thank you!

## 2019-09-20 NOTE — Telephone Encounter (Signed)
Left message to call back  

## 2019-09-25 NOTE — Telephone Encounter (Signed)
I spoke with pt and went over information from [place], [place] with her. Pt confirms she is not taking lisinopril. Is taking losartan. She is aware of appointment in our office on 12/17 and is planning on being at this appointment.

## 2019-09-29 DIAGNOSIS — I10 Essential (primary) hypertension: Secondary | ICD-10-CM | POA: Diagnosis not present

## 2019-10-02 NOTE — Progress Notes (Signed)
Georgiana Spinner, I have never done this before. How do I do this?

## 2019-10-12 ENCOUNTER — Other Ambulatory Visit: Payer: Self-pay

## 2019-10-12 ENCOUNTER — Encounter: Payer: Self-pay | Admitting: Cardiology

## 2019-10-12 ENCOUNTER — Ambulatory Visit: Payer: Medicare Other | Admitting: Cardiology

## 2019-10-12 VITALS — BP 122/72 | HR 70 | Ht 66.0 in | Wt 140.8 lb

## 2019-10-12 DIAGNOSIS — I10 Essential (primary) hypertension: Secondary | ICD-10-CM | POA: Diagnosis not present

## 2019-10-12 DIAGNOSIS — E785 Hyperlipidemia, unspecified: Secondary | ICD-10-CM | POA: Diagnosis not present

## 2019-10-12 DIAGNOSIS — I5042 Chronic combined systolic (congestive) and diastolic (congestive) heart failure: Secondary | ICD-10-CM

## 2019-10-12 DIAGNOSIS — I25119 Atherosclerotic heart disease of native coronary artery with unspecified angina pectoris: Secondary | ICD-10-CM | POA: Diagnosis not present

## 2019-10-12 MED ORDER — ISOSORBIDE MONONITRATE ER 60 MG PO TB24: 60.0000 mg | ORAL_TABLET | Freq: Every day | ORAL | 3 refills | Status: DC

## 2019-10-12 NOTE — Progress Notes (Signed)
Cardiology Office Note:    Date:  10/12/2019   ID:  Katherine ShamsCary Tomey, DOB 01/19/1946, MRN [id number]  PCP:  [name], Stephen O, MD  Cardiologist:  Lesleigh NoeHenry W Smith III, MD  Referring MD: [name], Stephen O, MD   Chief Complaint  Patient presents with  . Follow-up  . Congestive Heart Failure  . Chest Pain    History of Present Illness:    [name] is a 73 y.o. female with a past medical history significant for non-obstructive CAD on cardiac cath in 2018, coronary spasmnoted on cardiac cath in 2003,chronic diastolic CHF,hypertension, hyperlipidemia, Grave's disease with multinodular goiter (followed by Dr. Everardo AllEllison), GERD, and chronic lower back pain who is followed by Dr. Katrinka BlazingSmith.   Patient has had multiple cardiac caths in the past showing nonobstructive CAD.  On cardiac catheterization in 2003, she was noted to have intense diffuse left and right coronary spasm was relieved with intracoronary nitroglycerin.  She was started on oral long-acting nitrates at that time.  Patient was admitted in July/August 2018 for subacute thyroiditis and NSTEMI.  Echo showed LVEF 55% with mild hypokinesis of the basal inferior myocardium and grade 2 diastolic dysfunction.  Stress test at that time was considered intermediate risk and showed small defect in apical septal location and medium defect in the basal inferior and in inferior location.  She underwent left heart cath in 05/2017 which showed mild nonobstructive disease with 10% stenosis of the proximal to mid RCA, 20% stenosis of the distal circumflex, 10% stenosis of the mid LAD and 25% stenosis of OM1.  The patient was admitted to the hospital in October after presenting with multiple complaints including chest pain, back pain, headache and decreased appetite.  She thought this was due to her thyroid as she had had similar symptoms in the past with thyroiditis.  High-sensitivity troponins were minimally elevated and relatively flat.  EKG showed slightly more  pronounced ST depressions in inferior and lateral leads.  Patient reported not taking home diuretics and initial BNP was elevated at 2030.  Echo showed LVEF 35-40% with mild LVH, grade 2 diastolic dysfunction and elevated mean left atrial pressure as well as severely dilated left atrium and moderate MR.  She was given a dose of IV Lasix in the ED with bump in creatinine and improved BNP to 203 the next day.  She ended up testing positive for COVID-19 which was felt to be the cause of all of her symptoms.  She was treated with a Decadron taper.  Cardiology was consulted and elevated troponin felt to be due to demand ischemia and no further ischemic work-up was planned.Cardiac medications prior to admission included Aspirin 325mg  every other day, Amlodipine 10mg  daily, Lisinopril 20mg  daily, Propranolol 60mg  twice daily, Imdur 60mg  120mg  daily, Lipitor 20mg  daily, Zetia 10mg  daily. Lisinopril held on admission to allow for diuresis. Patient was initially hypertensive but then became borderline hypotensive with systolic BP as low as the 90's at times so other BP medications were briefly held.Home Amlodipine was eventually restarted at lower doseand home Propranolol was switched to Toprol-XL given reduced EF.Imdur was never restarted.Hospitalization complicated by AKI with creatinine peaking at 2.65. Renal function improved with hydration. Creatinine 1.03 at discharge.However, Lisinopril was not restarted.Also noted to have thrombocytopenia during admission with platelets as low as 104. Patient complained of some dysuria and found to have E.Coli UTI. She was treated with antibiotics. She alsocomplained of nausea during admission which was felt to be due to constipation. Abdominal X-ray on  10/18 showed moderate colonic stool burden without evidence of enteric obstruction. Patient was initially felt to be stable for discharge on 10/13 but patient and family felt very concerned and uncomfortable with this and  appealed the discharge. He ended up staying several more days and was discharged on 10/19. Patient discharged on Aspirin  daily, Amlodipine  daily, Toprol-XL  daily.  She was seen in follow-up in November by Marjie Skiff, PA.  She had a couple of episodes of chest pain.  She was restarted on Imdur 30 mg and losartan 25 mg was added.  At follow-up on 09/19/2019 she continued to have episodes of atypical chest pain.  She appears euvolemic however her weight was up 7 pounds over the prior 2 weeks.  Plan was to repeat echocardiogram in about 3 months and if EF not improved, may need to consider repeat ischemic evaluation.  The patient was apparently confused about some of her medications and there was possibility that she was taking lisinopril and losartan.  She also reportedly restarted amlodipine 10 mg on her own.  Was advised to stop lisinopril.  Patient also had complaints of left arm pain for which she was advised to follow-up with her PCP.  The patient is here today for close follow-up. She has been making slow progress since hosp discharge. She is still very fatigued with little activity. She is having some minor chest pain "but not enough to worry about". She has not had to use NTG. Her breathing is good. No palpitations, orthopnea, PND, or edema. She says that she feels like she is doing better than the average person. She is trying to be patient until she fully recovers.   After her last appointment at which time the patient was noted to have some confusion about her medications, she went into Dr. Erasmo Leventhal office and got all of her medications straightened out.  It appears that she is now taking appropriate medications.  She has Imdur 60 mg and is ordered  so has been taking 1/2 pill daily but she occasionally takes a whole pill and notes that her chest pain is better with the increased dose. Will increase dose to 60 mg   Her weight was up 7 pounds at last appointment and she is up  another 3 pounds today.  She does not have any edema or orthopnea.  She does not appear volume overloaded.  Her main concern is fatigue.  She states that her family has been bringing her food and encouraging her to eat and feels like this may be why her weight is up.  She tries to avoid excess sodium although not sure how much sodium is in the food that her family brings.  She does not add any extra salt.   Cardiac studies   Echo 08/07/19  IMPRESSIONS   1. Left ventricular ejection fraction, by visual estimation, is 35 to 40%. The left ventricle has moderately decreased function. Normal left ventricular size. There is mildly increased left ventricular hypertrophy. 2. Elevated mean left atrial pressure. 3. Left ventricular diastolic Doppler parameters are consistent with pseudonormalization pattern of LV diastolic filling. 4. Global right ventricle has normal systolic function.The right ventricular size is normal. 5. Left atrial size was severely dilated. 6. Right atrial size was normal. 7. The mitral valve is normal in structure. Moderate mitral valve regurgitation. No evidence of mitral stenosis. 8. The tricuspid valve is normal in structure. Tricuspid valve regurgitation is mild. 9. The aortic valve is tricuspid Aortic valve regurgitation  was not visualized by color flow Doppler. Mild aortic valve sclerosis without stenosis. 10. The pulmonic valve was normal in structure. Pulmonic valve regurgitation is trivial by color flow Doppler. 11. The inferior vena cava is normal in size with greater than 50% respiratory variability, suggesting right atrial pressure of 3 mmHg. 12. Moderate global reduction in LV systolic function; mild LVH; grade 2 diastolic dysfunction; moderate MR; severe LAE.  Past Medical History:  Diagnosis Date  . Arthritis   . Chronic lower back pain   . Cluster headache   . Coronary artery disease    a. 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 non obs dz LHC 2007. c. Non obs  dz (mild-mod) by [facility] 05/17/14 d. cath 05/26/17 mild non obstructive CAD  . Diverticulitis    a. Hx microperf 2012 - hospitalizated.  Marland Kitchen GERD (gastroesophageal reflux disease)   . Hypercholesteremia   . Hypertension   . PVC's (premature ventricular contractions)    a. Hx of trigeminy/bigeminy.  . Seizures ([place])   . Thyroid disease    hyperthyroid  . UTI (lower urinary tract infection)     Past Surgical History:  Procedure Laterality Date  . ABDOMINAL HYSTERECTOMY  1987   "partial"-still has ovaries  . CARDIAC CATHETERIZATION  05/17/2014  . CORONARY ANGIOPLASTY WITH STENT PLACEMENT  1995   "1"  . JOINT REPLACEMENT     right knee  . LEFT HEART CATH AND CORONARY ANGIOGRAPHY N/A 05/26/2017   Procedure: Left Heart Cath and Coronary Angiography;  Surgeon: Jettie Booze, MD;  Location: [facility];  Service: Cardiovascular;  Laterality: N/A;  . LEFT HEART CATHETERIZATION WITH CORONARY ANGIOGRAM N/A 05/17/2014   Procedure: LEFT HEART CATHETERIZATION WITH CORONARY ANGIOGRAM;  Surgeon: Leonie Man, MD;  Location: [facility] CATH LAB;  Service: Cardiovascular;  Laterality: N/A;  . TOTAL KNEE ARTHROPLASTY Right 2005  . TUBAL LIGATION  1970  . VASCULAR SURGERY      Current Medications: Current Meds  Medication Sig  . amLODipine (NORVASC) 5 MG tablet Take 5 mg by mouth daily.  Marland Kitchen atorvastatin (LIPITOR) 20 MG tablet Take 1 tablet (20 mg total) by mouth daily at 6 PM.  . B Complex Vitamins (VITAMIN B COMPLEX) TABS Take 1 tablet by mouth daily.  . cholecalciferol (VITAMIN D) 400 UNITS TABS tablet Take 400 Units daily by mouth.   . esomeprazole (NEXIUM) 40 MG capsule TAKE 1 CAPSULE(40 MG) BY MOUTH DAILY  . ezetimibe (ZETIA) 10 MG tablet TAKE 1 TABLET(10 MG) BY MOUTH DAILY  . Ferrous Sulfate Dried (EQ SLOW-RELEASE IRON) 45 MG TBCR Take 1 tablet by mouth daily.  Marland Kitchen ipratropium (ATROVENT HFA) 17 MCG/ACT inhaler Inhale 2 puffs into the lungs every 8 (eight) hours.  Marland Kitchen levalbuterol (XOPENEX HFA) 45  MCG/ACT inhaler Inhale 2 puffs into the lungs every 8 (eight) hours.  Marland Kitchen loperamide (IMODIUM) 2 MG capsule Take 1 capsule (2 mg total) by mouth as needed for diarrhea or loose stools.  Marland Kitchen losartan (COZAAR) 25 MG tablet Take 25 mg by mouth daily.  . methimazole (TAPAZOLE) 10 MG tablet TAKE 4 TABLETS BY MOUTH TWICE DAILY  . metoprolol succinate (TOPROL-XL) 25 MG 24 hr tablet Take 1.5 tablets (37.5 mg total) by mouth daily.  . nitroGLYCERIN (NITROSTAT) 0.4 MG SL tablet PLACE 1 TABLET UNDER THE TONGUE EVERY 5 MINUTES FOR CHEST PAIN FOR 3 DOSES  . oxybutynin (DITROPAN-XL) 10 MG 24 hr tablet TAKE 1 TABLET BY MOUTH ONCE DAILY  . oxyCODONE-acetaminophen (PERCOCET) 10-325 MG tablet Take  1 tablet by mouth 2 (two) times daily as needed.  . potassium chloride (MICRO-K) 10 MEQ CR capsule TAKE 1 CAPSULE BY MOUTH TWICE DAILY  . traMADol (ULTRAM) 50 MG tablet Take 1-2 tablets (50-100 mg total) by mouth every 6 (six) hours as needed for moderate pain.  Marland Kitchen venlafaxine XR (EFFEXOR-XR) 150 MG 24 hr capsule TAKE 1 CAPSULE BY MOUTH EVERY NIGHT AT BEDTIME - office visit before next refill  . vitamin C (VITAMIN C) 500 MG tablet Take 1 tablet (500 mg total) by mouth 2 (two) times daily.  . Vitamin E 180 MG CAPS Take 2 capsules by mouth daily.  Marland Kitchen zinc sulfate 220 (50 Zn) MG capsule Take 1 capsule (220 mg total) by mouth daily.  . [DISCONTINUED] isosorbide mononitrate (IMDUR) 30 MG 24 hr tablet Take 1 tablet (30 mg total) by mouth daily.     Allergies:   Sulfa drugs cross reactors   Social History   Socioeconomic History  . Marital status: Married    Spouse name: Not on file  . Number of children: 4  . Years of education: 4  . Highest education level: Not on file  Occupational History    Comment: retired  Tobacco Use  . Smoking status: Never Smoker  . Smokeless tobacco: Never Used  Substance and Sexual Activity  . Alcohol use: No  . Drug use: No  . Sexual activity: Never  Other Topics Concern  . Not on file    Social History Narrative   Separated. 4 children from first marriage. 16 grandkids.       Retired from [employer] for 25 years, went to [employer] for 5 years. Retired 2003 after MI      Hobbies: babysit/active with children      Right-handed      Caffeine: 24 oz soda per day      Social Determinants of Health   Financial Resource Strain:   . Difficulty of Paying Living Expenses: Not on file  Food Insecurity:   . Worried About Programme researcher, broadcasting/film/video in the Last Year: Not on file  . Ran Out of Food in the Last Year: Not on file  Transportation Needs:   . Lack of Transportation (Medical): Not on file  . Lack of Transportation (Non-Medical): Not on file  Physical Activity:   . Days of Exercise per Week: Not on file  . Minutes of Exercise per Session: Not on file  Stress:   . Feeling of Stress : Not on file  Social Connections:   . Frequency of Communication with Friends and Family: Not on file  . Frequency of Social Gatherings with Friends and Family: Not on file  . Attends Religious Services: Not on file  . Active Member of Clubs or Organizations: Not on file  . Attends Banker Meetings: Not on file  . Marital Status: Not on file     Family History: The patient's family history includes CAD in her brother and father; Diabetes in her brother, father, mother, and sister; Thyroid disease in her sister. ROS:   Please see the history of present illness.     All other systems reviewed and are negative.   EKG:  EKG is not ordered today.   Recent Labs: 08/05/2019: TSH 3.101 08/09/2019: B Natriuretic Peptide 386.1 08/14/2019: Magnesium 2.6 08/21/2019: Hemoglobin 11.9; Platelets 348.0 09/19/2019: ALT 21; BUN 18; Creatinine, Ser 1.10; Potassium 5.0; Sodium 144   Recent Lipid Panel    Component Value Date/Time  CHOL 155 12/30/2018 0802   TRIG 56.0 12/30/2018 0802   HDL 51.40 12/30/2018 0802   CHOLHDL 3 12/30/2018 0802   VLDL 11.2 12/30/2018 0802   LDLCALC 92  12/30/2018 0802    Physical Exam:    VS:  BP 122/72   Pulse 70   Ht  (1.676 m)   Wt 140 lb 12.8 oz (63.9 kg)   SpO2 100%   BMI 22.73 kg/m     Wt Readings from Last 6 Encounters:  10/12/19 140 lb 12.8 oz (63.9 kg)  09/19/19 137 lb 12.8 oz (62.5 kg)  09/04/19 131 lb 12.8 oz (59.8 kg)  08/06/19 133 lb 2.5 oz (60.4 kg)  12/26/18 137 lb (62.1 kg)  07/20/18 124 lb 12.8 oz (56.6 kg)     Physical Exam  Constitutional: She is oriented to person, place, and time. She appears well-developed and well-nourished. No distress.  HENT:  Head: Normocephalic and atraumatic.  Neck: No JVD present.  Cardiovascular: Normal rate, regular rhythm, normal heart sounds and intact distal pulses. Exam reveals no gallop and no friction rub.  No murmur heard. Pulmonary/Chest: Effort normal and breath sounds normal. No respiratory distress. She has no wheezes. She has no rales.  Abdominal: Soft. Bowel sounds are normal.  Musculoskeletal:        General: No edema. Normal range of motion.     Cervical back: Normal range of motion and neck supple.  Neurological: She is alert and oriented to person, place, and time.  Skin: Skin is warm and dry.  Psychiatric: She has a normal mood and affect. Her behavior is normal. Judgment and thought content normal.  Vitals reviewed.   ASSESSMENT:    1. Coronary artery disease involving native coronary artery of native heart with angina pectoris (HCC)   2. Chronic combined systolic and diastolic heart failure (HCC)   3. Essential (primary) hypertension   4. Hyperlipidemia, unspecified hyperlipidemia type    PLAN:    In order of problems listed above:  Nonobstructive CAD with history of coronary spasms -Nonobstructive CAD noted on multiple catheterizations, most recently in 05/2017. -Patient with recent atypical chest pain in the setting of COVID-19 infection.  Not felt to be ischemic related. -Patient continues on medical therapy with aspirin 81 mg, Toprol-XL  37.5 mg, amlodipine 5 mg, Imdur 30 mg. -Pt still having some mild chest discomfort, self limiting. She feels better on Imdur 60 mg so I will increase the dose.   Combined systolic and diastolic heart failure - Echo during recent hospitalization showed newly reduced EF of 35-40% with grade 2 diastolic dysfunction. EF down from 50-55% from 09/2017. Possibly due to COVID-19. -At last visit on 09/19/2019 patient appeared euvolemic but weight was up 7 pounds. Her wt is up 3 more pounds today. She appears euvolemic. No DOE, orthopnea or edema, but she is still fvery fatigued (post COVID).   She says that her family has been sending her food and she is eating better. I will check a BNP to evaluate for hidden volume.  -Medical therapy continues with Toprol-XL 37.5 mg daily, losartan 25 mg daily. -Given minimal disease on most recent cath about 2 years ago, severe CAD is not suspected.  Plan to repeat echocardiogram in 3 months after she fully recovers from COVID-19 infection.  If EF has not improved at that time, may consider repeat ischemic evaluation.  Patient has appointment for echocardiogram on 2/8 and follow-up with Dr. Katrinka Blazing on 2/19. -Will continue current medications.  Hypertension -BP well controlled.   Hyperlipidemia, LDL goal <70 - LDL 92 in 12/2018. LDL goal <70 given CAD. - Lipitor held during recent admission. ALT mildly elevated at 62 on most recent check on 08/21/2019 but other LFTs normal. - Continue Zetia 10mg  daily.  - LFTs returned to normal, Lipitor continued.   Medication Adjustments/Labs and Tests Ordered: Current medicines are reviewed at length with the patient today.  Concerns regarding medicines are outlined above. Labs and tests ordered and medication changes are outlined in the patient instructions below:  Patient Instructions  Medication Instructions:  Increase, Isosorbide (Imdur) to 60 mg once a day   *If you need a refill on your cardiac medications before your next  appointment, please call your pharmacy*  Lab Work: BNP & BMET - Today   If you have labs (blood work) drawn today and your tests are completely normal, you will receive your results only by: MyChart Message (if you have MyChart) OR . A paper copy in the mail If you have any lab test that is abnormal or we need to change your treatment, we will call you to review the results.  Testing/Procedures: None ordered   Follow-Up: Follow up with Dr. Marland Kitchen as planned on 12/15/2019@ 11:00 AM   Other Instructions None      Signed, 12/17/2019, NP  10/12/2019 4:02 PM    [facility]

## 2019-10-12 NOTE — Patient Instructions (Signed)
Medication Instructions:  Increase, Isosorbide (Imdur) to 60 mg once a day   *If you need a refill on your cardiac medications before your next appointment, please call your pharmacy*  Lab Work: BNP & BMET - Today   If you have labs (blood work) drawn today and your tests are completely normal, you will receive your results only by: Marland Kitchen MyChart Message (if you have MyChart) OR . A paper copy in the mail If you have any lab test that is abnormal or we need to change your treatment, we will call you to review the results.  Testing/Procedures: None ordered   Follow-Up: Follow up with Dr. Tamala Julian as planned on 12/15/2019@ 11:00 AM   Other Instructions None

## 2019-10-13 ENCOUNTER — Encounter: Payer: Self-pay | Admitting: Family Medicine

## 2019-10-13 ENCOUNTER — Ambulatory Visit (INDEPENDENT_AMBULATORY_CARE_PROVIDER_SITE_OTHER): Payer: Medicare Other | Admitting: Family Medicine

## 2019-10-13 ENCOUNTER — Ambulatory Visit: Payer: Medicare Other | Admitting: Physician Assistant

## 2019-10-13 VITALS — BP 126/78 | HR 82 | Temp 97.6°F | Ht 66.0 in | Wt 139.0 lb

## 2019-10-13 DIAGNOSIS — R569 Unspecified convulsions: Secondary | ICD-10-CM

## 2019-10-13 DIAGNOSIS — Z1211 Encounter for screening for malignant neoplasm of colon: Secondary | ICD-10-CM | POA: Diagnosis not present

## 2019-10-13 DIAGNOSIS — Z1231 Encounter for screening mammogram for malignant neoplasm of breast: Secondary | ICD-10-CM

## 2019-10-13 DIAGNOSIS — I5020 Unspecified systolic (congestive) heart failure: Secondary | ICD-10-CM

## 2019-10-13 DIAGNOSIS — Z8619 Personal history of other infectious and parasitic diseases: Secondary | ICD-10-CM

## 2019-10-13 DIAGNOSIS — U071 COVID-19: Secondary | ICD-10-CM

## 2019-10-13 LAB — BASIC METABOLIC PANEL
BUN/Creatinine Ratio: 13 (ref 12–28)
BUN: 13 mg/dL (ref 8–27)
CO2: 25 mmol/L (ref 20–29)
Calcium: 9.5 mg/dL (ref 8.7–10.3)
Chloride: 102 mmol/L (ref 96–106)
Creatinine, Ser: 0.97 mg/dL (ref 0.57–1.00)
GFR calc Af Amer: 67 mL/min/{1.73_m2} (ref >59)
GFR calc non Af Amer: 59 mL/min/{1.73_m2} — ABNORMAL LOW (ref >59)
Glucose: 80 mg/dL (ref 65–99)
Potassium: 4.4 mmol/L (ref 3.5–5.2)
Sodium: 140 mmol/L (ref 134–144)

## 2019-10-13 LAB — PRO B NATRIURETIC PEPTIDE: NT-Pro BNP: 932 pg/mL — ABNORMAL HIGH (ref 0–301)

## 2019-10-13 MED ORDER — METOPROLOL SUCCINATE ER 25 MG PO TB24: 37.5000 mg | ORAL_TABLET | Freq: Every day | ORAL | 1 refills | Status: DC

## 2019-10-13 MED ORDER — ATORVASTATIN CALCIUM 20 MG PO TABS: 20.0000 mg | ORAL_TABLET | Freq: Every day | ORAL | 3 refills | Status: DC

## 2019-10-13 NOTE — Patient Instructions (Addendum)
Health Maintenance Due  Topic Date Due  . MAMMOGRAM ordered today . Let us know if have not heard in 2 weeks 10/29/1996  . COLONOSCOPY referral started . Let us know if have not heard in 2 weeks.  10/29/1996   Need to pick up and restart atorvastatin 10 mg for cholesterol   Please take lisinopril and propranolol out of your medication rotation- the ones in the bottom of the bag  Make sure to get back in contact with cardiology- they want to use a fluid pill called lasix/furosemide and then schedule follow up with you   Recommended follow RC:BULAGT in about 6 months (around 04/12/2020) for physical or sooner if needed.

## 2019-10-13 NOTE — Progress Notes (Signed)
Phone [phone] In person visit   Subjective:   [name] is a 73 y.o. year old very pleasant female patient who presents for/with See problem oriented charting Chief Complaint  Patient presents with  . Follow-up    ROS- Review of Systems  Constitutional: Negative.   HENT: Negative.   Eyes: Negative.   Respiratory: Negative.   Cardiovascular: Positive for chest pain.       Discomfort in chest ongoing from hospital   Gastrointestinal: Negative.  Negative for constipation.  Genitourinary: Positive for frequency.       Started two weeks ago. Only about 3 nights a week.   Musculoskeletal: Negative.   Skin: Negative.   Neurological: Negative.   Endo/Heme/Allergies: Negative.   Psychiatric/Behavioral: Negative.       This visit occurred during the SARS-CoV-2 public health emergency.  Safety protocols were in place, including screening questions prior to the visit, additional usage of staff PPE, and extensive cleaning of exam room while observing appropriate contact time as indicated for disinfecting solutions.   Past Medical History-  Patient Active Problem List   Diagnosis Date Noted  . Heart failure with reduced ejection fraction (HCC) 08/21/2019    Priority: High  . COVID-19 08/07/2019    Priority: High  . Hyperthyroidism 05/22/2017    Priority: High  . Coronary artery disease involving native coronary artery of native heart with angina pectoris [facility])     Priority: High  . Seizures (HCC)     Priority: High  . Demand ischemia (HCC)     Priority: Medium  . Overactive bladder 09/19/2014    Priority: Medium  . Hypercholesteremia     Priority: Medium  . GERD (gastroesophageal reflux disease) 10/04/2011    Priority: Medium  . HTN (hypertension) 10/01/2011    Priority: Medium  . Arthritis     Priority: Low  . Anemia 12/22/2017    Medications- reviewed and updated Current Outpatient Medications  Medication Sig Dispense Refill  . amLODipine (NORVASC) 5 MG  tablet Take 5 mg by mouth daily.    Marland Kitchen atorvastatin (LIPITOR) 20 MG tablet Take 1 tablet (20 mg total) by mouth daily at 6 PM. 90 tablet 3  . cholecalciferol (VITAMIN D) 400 UNITS TABS tablet Take 400 Units daily by mouth.     . esomeprazole (NEXIUM) 40 MG capsule TAKE 1 CAPSULE(40 MG) BY MOUTH DAILY 90 capsule 2  . ezetimibe (ZETIA) 10 MG tablet TAKE 1 TABLET(10 MG) BY MOUTH DAILY 90 tablet 2  . Ferrous Sulfate Dried (EQ SLOW-RELEASE IRON) 45 MG TBCR Take 1 tablet by mouth daily. 90 tablet 1  . ipratropium (ATROVENT HFA) 17 MCG/ACT inhaler Inhale 2 puffs into the lungs every 8 (eight) hours. 1 Inhaler 12  . isosorbide mononitrate (IMDUR) 60 MG 24 hr tablet Take 1 tablet (60 mg total) by mouth daily. 90 tablet 3  . levalbuterol (XOPENEX HFA) 45 MCG/ACT inhaler Inhale 2 puffs into the lungs every 8 (eight) hours. 1 Inhaler 12  . loperamide (IMODIUM) 2 MG capsule Take 1 capsule (2 mg total) by mouth as needed for diarrhea or loose stools. 30 capsule 0  . losartan (COZAAR) 25 MG tablet Take 25 mg by mouth daily.    . methimazole (TAPAZOLE) 10 MG tablet TAKE 4 TABLETS BY MOUTH TWICE DAILY 240 tablet 0  . nitroGLYCERIN (NITROSTAT) 0.4 MG SL tablet PLACE 1 TABLET UNDER THE TONGUE EVERY 5 MINUTES FOR CHEST PAIN FOR 3 DOSES 25 tablet 0  . oxybutynin (DITROPAN-XL) 10 MG  24 hr tablet TAKE 1 TABLET BY MOUTH ONCE DAILY 90 tablet 3  . oxyCODONE-acetaminophen (PERCOCET) 10-325 MG tablet Take 1 tablet by mouth 2 (two) times daily as needed.    . potassium chloride (MICRO-K) 10 MEQ CR capsule TAKE 1 CAPSULE BY MOUTH TWICE DAILY 180 capsule 1  . traMADol (ULTRAM) 50 MG tablet Take 1-2 tablets (50-100 mg total) by mouth every 6 (six) hours as needed for moderate pain. 30 tablet 0  . venlafaxine XR (EFFEXOR-XR) 150 MG 24 hr capsule TAKE 1 CAPSULE BY MOUTH EVERY NIGHT AT BEDTIME - office visit before next refill 30 capsule 5  . vitamin C (VITAMIN C) 500 MG tablet Take 1 tablet (500 mg total) by mouth 2 (two) times  daily. 14 tablet 0  . Vitamin E 180 MG CAPS Take 2 capsules by mouth daily.    Marland Kitchen zinc sulfate 220 (50 Zn) MG capsule Take 1 capsule (220 mg total) by mouth daily. 7 capsule 0  . metoprolol succinate (TOPROL-XL) 25 MG 24 hr tablet Take 1.5 tablets (37.5 mg total) by mouth daily. 135 tablet 1   No current facility-administered medications for this visit.     Objective:  I will ask team to log vitals on Monday- reviewed from [facility] but need to be entered.  Gen: NAD, resting comfortably  CV: RRR  Lungs: nonlabored, normal respiratory rate Abdomen: soft/nondistended  Ext: no edema Skin: warm, dry Neuro: grossly normal, moves all extremities      Assessment and Plan    #hypertension S: compliant with amlodipine 5 mg, isosorbide mononitrate (imdur 60mg ), losartan 25 mg, metoprolol 37.5 mg extended release  Patient also had lisinopril and propranolol in her bag-she may have been taking these but stop have been written on the bottle by me previously BP Readings from Last 3 Encounters:  10/12/19 122/72  09/19/19 122/68  09/04/19 140/72  A/P: Good control today-needs make sure she is not taking the lisinopril and propranolol.  I offered her to dispose of these but instead she wanted to place these in a different portion of her bag and discard when she gets home.  #hyperlipidemia S: compliant with Zetia 10 mg.  Atorvastatin unfortunately was not included in her medications Lab Results  Component Value Date   CHOL 155 12/30/2018   HDL 51.40 12/30/2018   LDLCALC 92 12/30/2018   TRIG 56.0 12/30/2018   CHOLHDL 3 12/30/2018   A/P: Suspect poor control since she is not taking atorvastatin-we restarted this today.  We can recheck lipid panel at follow-up physical which was recommended in 6 months.  #COVID-19 survivor-patient has had some mild lingering fatigue since she had COVID-19.  She feels like Xopenex is helpful for this fatigue.  She requested a refill but I told her she already had  refills ordered-she will check with pharmacy.  No underlying known lung condition.  We reviewed prior CT scan with possible atelectasis versus atypical pneumonia-we agreed if having ongoing need for Xopenex at follow-up to repeat chest x-ray at minimum.  #Seizures  S: History of seizures in the past-Patient reports this was from childhood injury falling. .  No seizures since being on tramadol in past. Currently on tramadol for headaches-neurology has been okay with this and she has not had recurrence.  Has been on stronger narcotics in the past but we have discontinued this A/P: No evidence of recurrent seizures.  Continue to monitor   #Heart failure with reduced ejection fraction-occurring after COVID-19 S: I reviewed cardiology note  with patient and encouraged her to call cardiology on Monday.  They were concerned about slightly elevated BNP and wanted to treat her with a course of Lasix A/P: Patient agrees to call to get set up for this and also reminded her of 7 to 10-day follow-up recommended.  We discussed how fatigued she has been experiencing could be related to heart failure  #She notes urinary frequency but has known overactive bladder  #Health maintenance-agrees for mammogram and colonoscopy-Defer Tdap and dexa discussion  Recommended follow up: Return in about 6 months (around 04/12/2020) for physical or sooner if needed.  Also consider AWV at that time Future Appointments  Date Time Provider Department Center  12/04/2019 11:15 AM [id number]  12/15/2019 11:00 AM Lyn RecordsSmith, Henry W, MD [facility]    Lab/Order associations:   ICD-10-CM   1. Screen for colon cancer  Z12.11 Ambulatory referral to Gastroenterology  2. Encounter for screening mammogram for malignant neoplasm of breast  Z12.31 MM Digital Screening  3. Seizures (HCC)  R56.9   4. COVID-19  U07.1   5. Heart failure with reduced ejection fraction (HCC)  I50.20    Meds ordered this  encounter  Medications  . atorvastatin (LIPITOR) 20 MG tablet    Sig: Take 1 tablet (20 mg total) by mouth daily at 6 PM.    Dispense:  90 tablet    Refill:  3  . metoprolol succinate (TOPROL-XL) 25 MG 24 hr tablet    Sig: Take 1.5 tablets (37.5 mg total) by mouth daily.    Dispense:  135 tablet    Refill:  1    Return precautions advised.  Tana ConchStephen Hunter, MD

## 2019-10-16 ENCOUNTER — Telehealth: Payer: Self-pay

## 2019-10-16 MED ORDER — FUROSEMIDE 20 MG PO TABS: 20.0000 mg | ORAL_TABLET | Freq: Every day | ORAL | 3 refills | Status: DC

## 2019-10-16 NOTE — Telephone Encounter (Signed)
Pt aware of results. Pt verbalized understanding. A prescription for Lasix 20 mg QD has been sent to pt's pharmacy. Pt is scheduled to see Gae Bon on 11/02/2019

## 2019-10-16 NOTE — Telephone Encounter (Signed)
-----   Message from Daune Perch, NP sent at 10/13/2019  8:42 AM EST ----- BNP is elevated indicating fluid retention and strain on the heart. Please start lasix 20 mg daily and have have patient return to see an APP in 7 to 10 days for close follow-up and repeat labs.  Daune Perch, NP

## 2019-10-17 ENCOUNTER — Encounter: Payer: Self-pay | Admitting: Family Medicine

## 2019-10-18 DIAGNOSIS — I10 Essential (primary) hypertension: Secondary | ICD-10-CM | POA: Diagnosis not present

## 2019-10-24 ENCOUNTER — Encounter: Payer: Self-pay | Admitting: *Deleted

## 2019-10-31 ENCOUNTER — Ambulatory Visit: Payer: Medicare Other | Admitting: Nurse Practitioner

## 2019-11-02 ENCOUNTER — Ambulatory Visit: Payer: Medicare Other | Admitting: Cardiology

## 2019-11-02 NOTE — Progress Notes (Deleted)
Cardiology Office Note:    Date:  11/02/2019   ID:  [name], DOB 06-11-1946, MRN [id number]  PCP:  Marin Olp, MD  Cardiologist:  Sinclair Grooms, MD  Referring MD: Marin Olp, MD   No chief complaint on file. ***  History of Present Illness:    [name] is a 74 y.o. female with a past medical history significant for non-obstructive CAD on cardiac cath in 2018, coronary spasmnoted on cardiac cath in 5366,[id number] diastolic CHF,hypertension, hyperlipidemia, Grave's disease with multinodular goiter (followed by Dr. Loanne Drilling), GERD, and chronic lower back pain who is followed by Dr. Tamala Julian.   Patient has had multiple cardiac caths in the past showing nonobstructive CAD.  On cardiac catheterization in 2003, she was noted to have intense diffuse left and right coronary spasm was relieved with intracoronary nitroglycerin.  She was started on oral long-acting nitrates at that time.  Patient was admitted in July/August 2018 for subacute thyroiditis and NSTEMI.  Echo showed LVEF 55% with mild hypokinesis of the basal inferior myocardium and grade 2 diastolic dysfunction.  Stress test at that time was considered intermediate risk and showed small defect in apical septal location and medium defect in the basal inferior and in inferior location.  She underwent left heart cath in 05/2017 which showed mild nonobstructive disease with 10% stenosis of the proximal to mid RCA, 20% stenosis of the distal circumflex, 10% stenosis of the mid LAD and 25% stenosis of OM1.  The patient was admitted to the hospital in October after presenting with multiple complaints including chest pain, back pain, headache and decreased appetite.  She thought this was due to her thyroid as she had had similar symptoms in the past with thyroiditis.  High-sensitivity troponins were minimally elevated and relatively flat.  EKG showed slightly more pronounced ST depressions in inferior and lateral leads.  Patient  reported not taking home diuretics and initial BNP was elevated at 2030.  Echo showed LVEF 35-40% with mild LVH, grade 2 diastolic dysfunction and elevated mean left atrial pressure as well as severely dilated left atrium and moderate MR.  She was given a dose of IV Lasix in the ED with bump in creatinine and improved BNP to 203 the next day.  She ended up testing positive for COVID-19 which was felt to be the cause of all of her symptoms.  She was treated with a Decadron taper.  Cardiology was consulted and elevated troponin felt to be due to demand ischemia and no further ischemic work-up was planned.Cardiac medications prior to admission included Aspirin 325mg  every other day, Amlodipine 10mg  daily, Lisinopril 20mg  daily, Propranolol 60mg  twice daily, Imdur 60mg  120mg  daily, Lipitor 20mg  daily, Zetia 10mg  daily. Lisinopril held on admission to allow for diuresis. Patient was initially hypertensive but then became borderline hypotensive with systolic BP as low as the 90's at times so other BP medications were briefly held.Home Amlodipine was eventually restarted at lower doseand home Propranolol was switched to Toprol-XL given reduced EF.Imdur was never restarted.Hospitalization complicated by AKI with creatinine peaking at 2.65. Renal function improved with hydration. Creatinine 1.03 at discharge.However, Lisinopril was not restarted.Also noted to have thrombocytopenia during admission with platelets as low as 104. Patient complained of some dysuria and found to have E.Coli UTI. She was treated with antibiotics. She alsocomplained of nausea during admission which was felt to be due to constipation. Abdominal X-ray on 10/18 showed moderate colonic stool burden without evidence of enteric obstruction. Patient was initially  felt to be stable for discharge on 10/13 but patient and family felt very concerned and uncomfortable with this and appealed the discharge. She ended up staying several more days and was  discharged on 10/19. Patient discharged on Aspirin 81mg  daily, Amlodipine 5mg  daily, Toprol-XL 25mg  daily.  He was seen for hospital follow-up in November complaining of a couple of episodes of chest pain and was restarted on her Imdur 30 mg and losartan 25 mg was added.  She was seen again for complaints of continued episodes of atypical chest pain at which time she appears euvolemic however had a weight gain of 7 pounds over 2 weeks.   I saw the patient in December and noted that she was making slow progress since hospital discharge, still very fatigued with little activity.  She continued to have some minor chest pain but not enough to concern her.  She had been occasionally increasing her Imdur to 60 mg so I went ahead and increased her daily dose.  Her weight was up another 3 pounds although she did not appear volume overloaded.  BNP was checked and was slightly elevated so Lasix 20 mg daily was added.     ?repeat echocardiogram   Cardiac studies   Echo 08/07/19  IMPRESSIONS  1. Left ventricular ejection fraction, by visual estimation, is 35 to 40%. The left ventricle has moderately decreased function. Normal left ventricular size. There is mildly increased left ventricular hypertrophy. 2. Elevated mean left atrial pressure. 3. Left ventricular diastolic Doppler parameters are consistent with pseudonormalization pattern of LV diastolic filling. 4. Global right ventricle has normal systolic function.The right ventricular size is normal. 5. Left atrial size was severely dilated. 6. Right atrial size was normal. 7. The mitral valve is normal in structure. Moderate mitral valve regurgitation. No evidence of mitral stenosis. 8. The tricuspid valve is normal in structure. Tricuspid valve regurgitation is mild. 9. The aortic valve is tricuspid Aortic valve regurgitation was not visualized by color flow Doppler. Mild aortic valve sclerosis without stenosis. 10. The pulmonic valve was  normal in structure. Pulmonic valve regurgitation is trivial by color flow Doppler. 11. The inferior vena cava is normal in size with greater than 50% respiratory variability, suggesting right atrial pressure of 3 mmHg. 12. Moderate global reduction in LV systolic function; mild LVH; grade 2 diastolic dysfunction; moderate MR; severe LAE.   Past Medical History:  Diagnosis Date  . Anemia 12/22/2017  . Arthritis   . Chronic lower back pain   . Cluster headache   . Coronary artery disease    a. 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 non obs dz LHC 2007. c. Non obs dz (mild-mod) by [facility] 05/17/14 d. cath 05/26/17 mild non obstructive CAD  . COVID-19 08/07/2019  . Diverticulitis    a. Hx microperf 2012 - hospitalizated.  07/26/17 GERD (gastroesophageal reflux disease)   . Hypercholesteremia   . Hypertension   . Overactive bladder 09/19/2014   Oxybutynin 5mg  XL--> 10mg   . PVC's (premature ventricular contractions)    a. Hx of trigeminy/bigeminy.  . Seizures (HCC)   . Thyroid disease    hyperthyroid  . UTI (lower urinary tract infection)     Past Surgical History:  Procedure Laterality Date  . ABDOMINAL HYSTERECTOMY  1987   "partial"-still has ovaries  . CARDIAC CATHETERIZATION  05/17/2014  . CORONARY ANGIOPLASTY WITH STENT PLACEMENT  1995   "1"  . JOINT REPLACEMENT     right knee  . LEFT HEART CATH AND CORONARY ANGIOGRAPHY  N/A 05/26/2017   Procedure: Left Heart Cath and Coronary Angiography;  Surgeon: Corky Crafts, MD;  Location: [facility] INVASIVE CV LAB;  Service: Cardiovascular;  Laterality: N/A;  . LEFT HEART CATHETERIZATION WITH CORONARY ANGIOGRAM N/A 05/17/2014   Procedure: LEFT HEART CATHETERIZATION WITH CORONARY ANGIOGRAM;  Surgeon: Marykay Lex, MD;  Location: [facility] CATH LAB;  Service: Cardiovascular;  Laterality: N/A;  . TOTAL KNEE ARTHROPLASTY Right 2005  . TUBAL LIGATION  1970  . VASCULAR SURGERY      Current Medications: No outpatient medications have been marked as taking for the 11/02/19  encounter (Appointment) with Berton Bon, NP.     Allergies:   Sulfa drugs cross reactors   Social History   Socioeconomic History  . Marital status: Married    Spouse name: Not on file  . Number of children: 4  . Years of education: 4  . Highest education level: Not on file  Occupational History    Comment: retired  Tobacco Use  . Smoking status: Never Smoker  . Smokeless tobacco: Never Used  Substance and Sexual Activity  . Alcohol use: No  . Drug use: No  . Sexual activity: Never  Other Topics Concern  . Not on file  Social History Narrative   Separated. 4 children from first marriage. 16 grandkids.       Retired from [employer] for 25 years, went to [employer] for 5 years. Retired 2003 after MI      Hobbies: babysit/active with children      Right-handed      Caffeine: 24 oz soda per day      Social Determinants of Health   Financial Resource Strain:   . Difficulty of Paying Living Expenses: Not on file  Food Insecurity:   . Worried About Programme researcher, broadcasting/film/video in the Last Year: Not on file  . Ran Out of Food in the Last Year: Not on file  Transportation Needs:   . Lack of Transportation (Medical): Not on file  . Lack of Transportation (Non-Medical): Not on file  Physical Activity:   . Days of Exercise per Week: Not on file  . Minutes of Exercise per Session: Not on file  Stress:   . Feeling of Stress : Not on file  Social Connections:   . Frequency of Communication with Friends and Family: Not on file  . Frequency of Social Gatherings with Friends and Family: Not on file  . Attends Religious Services: Not on file  . Active Member of Clubs or Organizations: Not on file  . Attends Banker Meetings: Not on file  . Marital Status: Not on file     Family History: The patient's ***family history includes CAD in her brother and father; Diabetes in her brother, father, mother, and sister; Thyroid disease in her sister. ROS:   Please see the history  of present illness.    *** All other systems reviewed and are negative.   EKG:  EKG is *** ordered today.  The ekg ordered today demonstrates ***  Recent Labs: 08/05/2019: TSH 3.101 08/09/2019: B Natriuretic Peptide 386.1 08/14/2019: Magnesium 2.6 08/21/2019: Hemoglobin 11.9; Platelets 348.0 09/19/2019: ALT 21 10/12/2019: BUN 13; Creatinine, Ser 0.97; NT-Pro BNP 932; Potassium 4.4; Sodium 140   Recent Lipid Panel    Component Value Date/Time   CHOL 155 12/30/2018 0802   TRIG 56.0 12/30/2018 0802   HDL 51.40 12/30/2018 0802   CHOLHDL 3 12/30/2018 0802   VLDL 11.2 12/30/2018 0802  LDLCALC 92 12/30/2018 0802    Physical Exam:    VS:  There were no vitals taken for this visit.    Wt Readings from Last 6 Encounters:  10/17/19 139 lb (63 kg)  10/12/19 140 lb 12.8 oz (63.9 kg)  09/19/19 137 lb 12.8 oz (62.5 kg)  09/04/19 131 lb 12.8 oz (59.8 kg)  08/06/19 133 lb 2.5 oz (60.4 kg)  12/26/18 137 lb (62.1 kg)     Physical Exam***   ASSESSMENT:    No diagnosis found. PLAN:    In order of problems listed above:  Nonobstructive CAD with history of coronary spasms -Nonobstructive CAD noted on multiple catheterizations, most recently in 05/2017.  She did have notable coronary artery spasms and is being treated with long-acting nitrates. -Continues on medical therapy with aspirin 81 mg, Toprol-XL 37.5 mg, amlodipine 5 mg, Imdur 60 mg. -  Combined systolic and diastolic heart failure -Patient had COVID-19 infection in October and noted to have decreased EF 35-40% with grade 2 diastolic function down from 50-55%. -Medical therapy continues with Toprol-XL 37.5 mg daily, losartan 25 mg daily.  Weight was up recently with slightly elevated BNP so Lasix 20 mg was added on 10/12/2019. - -Given minimal disease on most recent cath about 2 years ago, severe CAD is not suspected.  Plan to repeat echocardiogram in 3 months after she fully recovers from COVID-19 infection.  If EF has not  improved at that time, may consider repeat ischemic evaluation.  Patient has appointment for echocardiogram on 2/8 and follow-up with Dr. Katrinka Blazing on 2/19. -Will continue current medications.   Hypertension -  Hyperlipidemia, LDL goal<70 -LDL 92 in 12/2018. LDL goal <70 given CAD. -Lipitor held during October admission. ALT mildly elevated at 62 on most recent check on 08/21/2019 but other LFTs normal. - Continue Zetia 10mg  daily.  - LFTs returned to normal, Lipitor continued.   Medication Adjustments/Labs and Tests Ordered: Current medicines are reviewed at length with the patient today.  Concerns regarding medicines are outlined above. Labs and tests ordered and medication changes are outlined in the patient instructions below:  There are no Patient Instructions on file for this visit.   Signed, , NP  11/02/2019 4:18 AM    [facility]

## 2019-11-21 ENCOUNTER — Encounter: Payer: Self-pay | Admitting: Family Medicine

## 2019-11-24 DIAGNOSIS — I10 Essential (primary) hypertension: Secondary | ICD-10-CM | POA: Diagnosis not present

## 2019-12-04 ENCOUNTER — Ambulatory Visit (HOSPITAL_COMMUNITY): Payer: Medicare Other | Attending: Cardiovascular Disease

## 2019-12-04 ENCOUNTER — Other Ambulatory Visit: Payer: Self-pay

## 2019-12-04 ENCOUNTER — Other Ambulatory Visit: Payer: Self-pay | Admitting: Student

## 2019-12-04 DIAGNOSIS — E785 Hyperlipidemia, unspecified: Secondary | ICD-10-CM | POA: Diagnosis not present

## 2019-12-04 DIAGNOSIS — I10 Essential (primary) hypertension: Secondary | ICD-10-CM | POA: Insufficient documentation

## 2019-12-04 DIAGNOSIS — I5042 Chronic combined systolic (congestive) and diastolic (congestive) heart failure: Secondary | ICD-10-CM | POA: Insufficient documentation

## 2019-12-04 DIAGNOSIS — I2510 Atherosclerotic heart disease of native coronary artery without angina pectoris: Secondary | ICD-10-CM | POA: Insufficient documentation

## 2019-12-05 ENCOUNTER — Telehealth: Payer: Self-pay

## 2019-12-05 ENCOUNTER — Other Ambulatory Visit (HOSPITAL_COMMUNITY): Payer: Medicare Other

## 2019-12-05 NOTE — Telephone Encounter (Signed)
The patient's son has been notified of the Echo result and verbalized understanding.  All questions (if any) were answered. Sigurd Sos, RN 12/05/2019 10:03 AM

## 2019-12-05 NOTE — Telephone Encounter (Signed)
-----   Message from Corrin Parker, [place] sent at 12/04/2019  4:17 PM EST ----- Please notify patient of results: Pumping function of the heart has improved which is great. Mild to moderate leaking of the one of her heart valves (mitral valve) was noted but this was also seen on last Echo. We can continue to monitor this going forward. Patient should keep upcoming appointment with Dr. Katrinka Blazing.   Thank you!

## 2019-12-05 NOTE — Telephone Encounter (Signed)
-----   Message from Callie E Goodrich, PA-C sent at 12/04/2019  4:17 PM EST ----- Please notify patient of results: Pumping function of the heart has improved which is great. Mild to moderate leaking of the one of her heart valves (mitral valve) was noted but this was also seen on last Echo. We can continue to monitor this going forward. Patient should keep upcoming appointment with Dr. Smith.   Thank you! 

## 2019-12-05 NOTE — Telephone Encounter (Signed)
lpmtcb 2/9 Echo results

## 2019-12-06 ENCOUNTER — Encounter: Payer: Self-pay | Admitting: Interventional Cardiology

## 2019-12-15 ENCOUNTER — Ambulatory Visit: Payer: Medicare Other | Admitting: Interventional Cardiology

## 2019-12-20 ENCOUNTER — Telehealth: Payer: Self-pay | Admitting: Family Medicine

## 2019-12-20 NOTE — Telephone Encounter (Signed)
"  I called this patient to schedule AWV w/ Toni Amend, and she also requested to see Dr. Durene Cal because she is having back pain. The next available time that I saw for him was on 01/02/2020 at 1:00 which was a same day appointment. I tried to schedule her at that time, but it wouldn't let me. Is this something that we can override? If not, then I'll try to call her back to reschedule."   Is it okay to schedule patient for same day 01/02/20 @ 2:20 pm with Dr.Hunter?

## 2019-12-20 NOTE — Telephone Encounter (Signed)
That is fine 

## 2019-12-22 DIAGNOSIS — I10 Essential (primary) hypertension: Secondary | ICD-10-CM | POA: Diagnosis not present

## 2019-12-26 ENCOUNTER — Other Ambulatory Visit: Payer: Self-pay

## 2019-12-26 ENCOUNTER — Telehealth: Payer: Self-pay

## 2019-12-26 MED ORDER — AMLODIPINE BESYLATE 10 MG PO TABS: 10.0000 mg | ORAL_TABLET | Freq: Every day | ORAL | 0 refills | Status: DC

## 2019-12-26 NOTE — Telephone Encounter (Signed)
My last note has 5 mg listed-patient appears at another visit to have reported taking 10 mg-please call to clarify with patient.  Also please see how her blood pressures are doing-if she is on 10 mg and blood pressure is well controlled I am okay with continuing 10 mg

## 2019-12-26 NOTE — Telephone Encounter (Signed)
Called and lm on pt vm tcb. 

## 2019-12-26 NOTE — Telephone Encounter (Signed)
Pt says she has been taking 5mg , cutting the 10mg  tablet in half. She would like to leave the Rx as is and her blood pressures are doing good.

## 2019-12-28 ENCOUNTER — Ambulatory Visit (INDEPENDENT_AMBULATORY_CARE_PROVIDER_SITE_OTHER): Payer: Medicare Other | Admitting: Cardiology

## 2019-12-28 ENCOUNTER — Other Ambulatory Visit: Payer: Self-pay

## 2019-12-28 ENCOUNTER — Encounter: Payer: Self-pay | Admitting: Cardiology

## 2019-12-28 VITALS — BP 144/80 | HR 74 | Ht 66.0 in | Wt 150.0 lb

## 2019-12-28 DIAGNOSIS — I248 Other forms of acute ischemic heart disease: Secondary | ICD-10-CM | POA: Diagnosis not present

## 2019-12-28 DIAGNOSIS — I5042 Chronic combined systolic (congestive) and diastolic (congestive) heart failure: Secondary | ICD-10-CM | POA: Diagnosis not present

## 2019-12-28 DIAGNOSIS — I10 Essential (primary) hypertension: Secondary | ICD-10-CM | POA: Diagnosis not present

## 2019-12-28 DIAGNOSIS — Z8679 Personal history of other diseases of the circulatory system: Secondary | ICD-10-CM | POA: Diagnosis not present

## 2019-12-28 MED ORDER — METOPROLOL SUCCINATE ER 25 MG PO TB24: 25.0000 mg | ORAL_TABLET | Freq: Every day | ORAL | 1 refills | Status: DC

## 2019-12-28 MED ORDER — ISOSORBIDE MONONITRATE ER 120 MG PO TB24: 120.0000 mg | ORAL_TABLET | Freq: Every day | ORAL | 1 refills | Status: DC

## 2019-12-28 NOTE — Progress Notes (Signed)
Cardiology Office Note   Date:  12/28/2019   ID:  [name], DOB Jan 30, 1946, MRN [id number]  PCP:  Katherine Majestic, MD  Cardiologist: Dr. Verdis Prime, MD  No chief complaint on file.   History of Present Illness: [name] is a 74 y.o. female who presents for follow up, seen for Dr. Katrinka Reynolds.  [name] has a past medical history significant for non-obstructive CAD on cardiac cath in 2018, coronary spasmnoted on cardiac cath in 2003,chronic diastolic CHF,hypertension, hyperlipidemia, Grave's disease with multinodular goiter (followed by Dr. Everardo Reynolds), GERD, and chronic lower back pain.   Patient has had multiple cardiac caths in the past showing nonobstructive CAD.  On cardiac catheterization in 2003, she was noted to have intense diffuse left and right coronary spasm was relieved with intracoronary nitroglycerin.  She was started on oral long-acting nitrates at that time.  Patient was admitted in July/August 2018 for subacute thyroiditis and NSTEMI.  Echo showed LVEF 55% with mild hypokinesis of the basal inferior myocardium and grade 2 diastolic dysfunction. Stress test at that time was considered intermediate risk and showed small defect in apical septal location and medium defect in the basal inferior and in inferior location.  She underwent left heart cath in 05/2017 which showed mild nonobstructive disease with 10% stenosis of the proximal to mid RCA, 20% stenosis of the distal circumflex, 10% stenosis of the mid LAD and 25% stenosis of OM1.  She admitted 07/2019 with multiple complaints of chest and back pain along with headache and decreased appetite. Patient reported not taking home diuretics and initial BNP was elevated at 2030.  Echo showed LVEF 35-40% with mild LVH, grade 2 diastolic dysfunction and elevated mean left atrial pressure as well as severely dilated left atrium and moderate MR. She ended up testing positive for COVID-19 which was felt to be the cause of Reynolds of  her symptoms.    She was seen in follow-up in 08/2019 by Katherine Skiff, PA with complaints of several episodes of chest pain. She was restarted on Imdur 30 mg and losartan 25 mg was added.  At follow-up on 09/19/2019 she continued to have episodes of atypical chest pain. Plan was to repeat echocardiogram in about 3 months and if EF not improved, may need to consider repeat ischemic evaluation. The patient was apparently confused about some of her medications and there was possibility that she was taking lisinopril and losartan. She also reportedly restarted amlodipine 10 mg on her own. Was advised to stop lisinopril.   She was last seen in follow-up 10/12/2019 with complaints of persistent fatigue and some minor chest pain "but not enough to worry about". Her weight was up 3lbs since her last OV.  At which time she was continued on Imdur 30.  Today, patient has continued episodes of anginal symptoms approximately 3 times per week similar to prior episodes.  We discussed increasing her Imdur.  She was previously on Imdur 120 mg p.o. daily with no symptoms.  She is currently taking 60 mg daily.  BP is stable at 144/80.  We discussed decreasing her Toprol slightly to allow form more BP room.  We will plan to increase Imdur to 120 mg p.o. daily with close follow-up.  Patient is to monitor BP closely.  She denies palpitations, LE edema, shortness of breath, PND, dizziness or syncope.   Past Medical History:  Diagnosis Date  . Anemia 12/22/2017  . Arthritis   . Chronic lower back pain   .  Cluster headache   . Coronary artery disease    a. 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 non obs dz LHC 2007. c. Non obs dz (mild-mod) by [facility] 05/17/14 d. cath 05/26/17 mild non obstructive CAD  . COVID-19 08/07/2019  . Diverticulitis    a. Hx microperf 2012 - hospitalizated.  Marland Kitchen GERD (gastroesophageal reflux disease)   . Hypercholesteremia   . Hypertension   . Overactive bladder 09/19/2014   Oxybutynin 5mg  XL--> 10mg   . PVC's  (premature ventricular contractions)    a. Hx of trigeminy/bigeminy.  . Seizures (HCC)   . Thyroid disease    hyperthyroid  . UTI (lower urinary tract infection)     Past Surgical History:  Procedure Laterality Date  . ABDOMINAL HYSTERECTOMY  1987   "partial"-still has ovaries  . CARDIAC CATHETERIZATION  05/17/2014  . CORONARY ANGIOPLASTY WITH STENT PLACEMENT  1995   "1"  . JOINT REPLACEMENT     right knee  . LEFT HEART CATH AND CORONARY ANGIOGRAPHY N/A 05/26/2017   Procedure: Left Heart Cath and Coronary Angiography;  Surgeon: 05/19/2014, MD;  Location: [facility] INVASIVE CV LAB;  Service: Cardiovascular;  Laterality: N/A;  . LEFT HEART CATHETERIZATION WITH CORONARY ANGIOGRAM N/A 05/17/2014   Procedure: LEFT HEART CATHETERIZATION WITH CORONARY ANGIOGRAM;  Surgeon: [facility], MD;  Location: [facility] CATH LAB;  Service: Cardiovascular;  Laterality: N/A;  . TOTAL KNEE ARTHROPLASTY Right 2005  . TUBAL LIGATION  1970  . VASCULAR SURGERY       Current Outpatient Medications  Medication Sig Dispense Refill  . amLODipine (NORVASC) 10 MG tablet Take 1 tablet (10 mg total) by mouth daily. 90 tablet 0  . atorvastatin (LIPITOR) 20 MG tablet Take 1 tablet (20 mg total) by mouth daily at 6 PM. 90 tablet 3  . cholecalciferol (VITAMIN D) 400 UNITS TABS tablet Take 400 Units daily by mouth.     . esomeprazole (NEXIUM) 40 MG capsule TAKE 1 CAPSULE(40 MG) BY MOUTH DAILY 90 capsule 2  . ezetimibe (ZETIA) 10 MG tablet TAKE 1 TABLET(10 MG) BY MOUTH DAILY 90 tablet 2  . Ferrous Sulfate Dried (EQ SLOW-RELEASE IRON) 45 MG TBCR Take 1 tablet by mouth daily. 90 tablet 1  . furosemide (LASIX) 20 MG tablet Take 1 tablet (20 mg total) by mouth daily. 90 tablet 3  . ipratropium (ATROVENT HFA) 17 MCG/ACT inhaler Inhale 2 puffs into the lungs every 8 (eight) hours. 1 Inhaler 12  . isosorbide mononitrate (IMDUR) 60 MG 24 hr tablet Take 1 tablet (60 mg total) by mouth daily. 90 tablet 3  . levalbuterol (XOPENEX  HFA) 45 MCG/ACT inhaler Inhale 2 puffs into the lungs every 8 (eight) hours. 1 Inhaler 12  . loperamide (IMODIUM) 2 MG capsule Take 1 capsule (2 mg total) by mouth as needed for diarrhea or loose stools. 30 capsule 0  . losartan (COZAAR) 25 MG tablet Take 25 mg by mouth daily.    . methimazole (TAPAZOLE) 10 MG tablet TAKE 4 TABLETS BY MOUTH TWICE DAILY 240 tablet 0  . metoprolol succinate (TOPROL-XL) 25 MG 24 hr tablet Take 1.5 tablets (37.5 mg total) by mouth daily. 135 tablet 1  . nitroGLYCERIN (NITROSTAT) 0.4 MG SL tablet PLACE 1 TABLET UNDER THE TONGUE EVERY 5 MINUTES FOR CHEST PAIN FOR 3 DOSES 25 tablet 0  . oxybutynin (DITROPAN-XL) 10 MG 24 hr tablet TAKE 1 TABLET BY MOUTH ONCE DAILY 90 tablet 3  . oxyCODONE-acetaminophen (PERCOCET) 10-325 MG tablet Take 1 tablet by mouth  2 (two) times daily as needed.    . potassium chloride (MICRO-K) 10 MEQ CR capsule TAKE 1 CAPSULE BY MOUTH TWICE DAILY 180 capsule 1  . traMADol (ULTRAM) 50 MG tablet Take 1-2 tablets (50-100 mg total) by mouth every 6 (six) hours as needed for moderate pain. 30 tablet 0  . venlafaxine XR (EFFEXOR-XR) 150 MG 24 hr capsule TAKE 1 CAPSULE BY MOUTH EVERY NIGHT AT BEDTIME - office visit before next refill 30 capsule 5  . vitamin C (VITAMIN C) 500 MG tablet Take 1 tablet (500 mg total) by mouth 2 (two) times daily. 14 tablet 0  . Vitamin E 180 MG CAPS Take 2 capsules by mouth daily.    Marland Kitchen zinc sulfate 220 (50 Zn) MG capsule Take 1 capsule (220 mg total) by mouth daily. 7 capsule 0   No current facility-administered medications for this visit.    Allergies:   Sulfa drugs cross reactors    Social History:  The patient  reports that she has never smoked. She has never used smokeless tobacco. She reports that she does not drink alcohol or use drugs.   Family History:  The patient's family history includes CAD in her brother and father; Diabetes in her brother, father, mother, and sister; Thyroid disease in her sister.    ROS:   Please see the history of present illness. Otherwise, review of systems are positive for none.   Reynolds other systems are reviewed and negative.    PHYSICAL EXAM: VS:  There were no vitals taken for this visit. , BMI There is no height or weight on file to calculate BMI.   General: Well developed, well nourished, NAD Neck: Negative for carotid bruits. No JVD Lungs:Clear to ausculation bilaterally. No wheezes, rales, or rhonchi. Breathing is unlabored. Cardiovascular: RRR with S1 S2. No murmurs, rubs, gallops, or LV heave appreciated. Extremities: No edema. Radial pulses 2+ bilaterally Neuro: Alert and oriented. No focal deficits. No facial asymmetry. MAE spontaneously. Psych: Responds to questions appropriately with normal affect.     EKG:  EKG is ordered today. The ekg ordered today demonstrates NSR with nonspecific T wave abnormalities, evidence of LVH  Recent Labs: 08/05/2019: TSH 3.101 08/09/2019: B Natriuretic Peptide 386.1 08/14/2019: Magnesium 2.6 08/21/2019: Hemoglobin 11.9; Platelets 348.0 09/19/2019: ALT 21 10/12/2019: BUN 13; Creatinine, Ser 0.97; NT-Pro BNP 932; Potassium 4.4; Sodium 140    Lipid Panel    Component Value Date/Time   CHOL 155 12/30/2018 0802   TRIG 56.0 12/30/2018 0802   HDL 51.40 12/30/2018 0802   CHOLHDL 3 12/30/2018 0802   VLDL 11.2 12/30/2018 0802   LDLCALC 92 12/30/2018 0802     Wt Readings from Last 3 Encounters:  10/17/19 139 lb (63 kg)  10/12/19 140 lb 12.8 oz (63.9 kg)  09/19/19 137 lb 12.8 oz (62.5 kg)    Other studies Reviewed: Additional studies/ records that were reviewed today include:  Echo 08/07/19  IMPRESSIONS   1. Left ventricular ejection fraction, by visual estimation, is 35 to 40%. The left ventricle has moderately decreased function. Normal left ventricular size. There is mildly increased left ventricular hypertrophy. 2. Elevated mean left atrial pressure. 3. Left ventricular diastolic Doppler parameters are  consistent with pseudonormalization pattern of LV diastolic filling. 4. Global right ventricle has normal systolic function.The right ventricular size is normal. 5. Left atrial size was severely dilated. 6. Right atrial size was normal. 7. The mitral valve is normal in structure. Moderate mitral valve regurgitation. No evidence of mitral  stenosis. 8. The tricuspid valve is normal in structure. Tricuspid valve regurgitation is mild. 9. The aortic valve is tricuspid Aortic valve regurgitation was not visualized by color flow Doppler. Mild aortic valve sclerosis without stenosis. 10. The pulmonic valve was normal in structure. Pulmonic valve regurgitation is trivial by color flow Doppler. 11. The inferior vena cava is normal in size with greater than 50% respiratory variability, suggesting right atrial pressure of 3 mmHg. 12. Moderate global reduction in LV systolic function; mild LVH; grade 2 diastolic dysfunction; moderate MR; severe LAE.   Echocardiogram 12/04/2019:  1. Left ventricular ejection fraction, by estimation, is 55 to 60%. The  left ventricle has normal function.  2. Mild to moderate mitral valve regurgitation.  3. There is normal pulmonary artery systolic pressure.   ASSESSMENT AND PLAN:  1.  Nonobstructive CAD with history of coronary vasospasms: -Nonobstructive CAD noted on multiple cardiac catheterizations, most recent 05/27/2017 -Continue ASA 81, amlodipine 5, Imdur 60 -We will increase Imdur to 120 mg p.o. daily and decrease Toprol to 25 mg p.o. daily -Discussed close BP monitoring and symptoms of orthostatic hypotension -Plan for close follow-up in office  2.  Combined systolic and diastolic heart failure: -Per echocardiogram during last hospitalization which showed LVEF at 35 to 40% with G2 DD down from 50 to 55% 09/2017 felt to be secondary to COVID-19 infection -Continue Toprol-XL, losartan -Repeat echocardiogram performed 12/04/2019 with improvement in LV  function to 55 to 60% with mild to moderate mitral valve regurgitation -Appears euvolemic on exam  3.  Hypertension: -Stable, 144/80 -Decrease Toprol to 25 mg p.o. daily, increase Imdur to 120 mg p.o. daily  4.  Hyperlipidemia: -LDL goal <70 -Last LDL, 90 11/28/2018 -Continue Lipitor, Zetia   Current medicines are reviewed at length with the patient today.  The patient does not have concerns regarding medicines.  The following changes have been made: 3 7 order 120 mg p.o. daily, decrease Toprol to 25 mg p.o. daily  Labs/ tests ordered today include: None  No orders of the defined types were placed in this encounter.   Disposition:   FU with Dr. Tamala Julian in 1 month  Signed, Kathyrn Drown, NP  12/28/2019 8:33 AM    [address], [place], [place]  [address] Phone: [phone]; Fax: [phone]

## 2019-12-28 NOTE — Patient Instructions (Addendum)
Medication Instructions:   Your physician has recommended you make the following change in your medication:   1) Increase Imdur to 120 mg, 1 tablet by mouth once a day 2) Decrease Toprol to 25 mg, 1 tablet by mouth once a day  *If you need a refill on your cardiac medications before your next appointment, please call your pharmacy*  Lab Work:  None ordered today  If you have labs (blood work) drawn today and your tests are completely normal, you will receive your results only by: Marland Kitchen MyChart Message (if you have MyChart) OR . A paper copy in the mail If you have any lab test that is abnormal or we need to change your treatment, we will call you to review the results.  Testing/Procedures:  None ordered today  Follow-Up: At [facility] you and your health needs are our priority.  As part of our continuing mission to provide you with exceptional heart care, we have created designated Provider Care Teams.  These Care Teams include your primary Cardiologist (physician) and Advanced Practice Providers (APPs -  Physician Assistants and Nurse Practitioners) who all work together to provide you with the care you need, when you need it.  We recommend signing up for the patient portal called "MyChart".  Sign up information is provided on this After Visit Summary.  MyChart is used to connect with patients for Virtual Visits (Telemedicine).  Patients are able to view lab/test results, encounter notes, upcoming appointments, etc.  Non-urgent messages can be sent to your provider as well.   To learn more about what you can do with MyChart, go to [email or web address].    Your next appointment:    On 02/09/20 at 10:40AM with Verdis Prime, MD

## 2020-01-02 ENCOUNTER — Other Ambulatory Visit: Payer: Self-pay

## 2020-01-02 ENCOUNTER — Ambulatory Visit: Payer: Medicare Other | Admitting: Family Medicine

## 2020-01-02 ENCOUNTER — Ambulatory Visit: Payer: Medicare Other

## 2020-01-05 ENCOUNTER — Telehealth: Payer: Self-pay | Admitting: Endocrinology

## 2020-01-06 NOTE — Telephone Encounter (Signed)
1.  Please schedule f/u appt 2.  Then please refill x 1, pending that appt.  

## 2020-01-08 NOTE — Telephone Encounter (Signed)
LMTCB to schedule appointment with Dr Ellison.   

## 2020-01-09 ENCOUNTER — Other Ambulatory Visit: Payer: Self-pay

## 2020-01-09 ENCOUNTER — Ambulatory Visit (INDEPENDENT_AMBULATORY_CARE_PROVIDER_SITE_OTHER): Payer: Medicare Other | Admitting: Family Medicine

## 2020-01-09 ENCOUNTER — Ambulatory Visit (INDEPENDENT_AMBULATORY_CARE_PROVIDER_SITE_OTHER): Payer: Medicare Other

## 2020-01-09 ENCOUNTER — Encounter (INDEPENDENT_AMBULATORY_CARE_PROVIDER_SITE_OTHER): Payer: Self-pay

## 2020-01-09 ENCOUNTER — Encounter: Payer: Self-pay | Admitting: Family Medicine

## 2020-01-09 VITALS — BP 132/76 | HR 76 | Temp 98.2°F | Ht 66.0 in | Wt 153.0 lb

## 2020-01-09 VITALS — BP 132/76 | Temp 98.2°F | Ht 66.0 in | Wt 153.0 lb

## 2020-01-09 DIAGNOSIS — E0590 Thyrotoxicosis, unspecified without thyrotoxic crisis or storm: Secondary | ICD-10-CM | POA: Diagnosis not present

## 2020-01-09 DIAGNOSIS — G8929 Other chronic pain: Secondary | ICD-10-CM | POA: Diagnosis not present

## 2020-01-09 DIAGNOSIS — M545 Low back pain, unspecified: Secondary | ICD-10-CM

## 2020-01-09 DIAGNOSIS — R569 Unspecified convulsions: Secondary | ICD-10-CM

## 2020-01-09 DIAGNOSIS — G43109 Migraine with aura, not intractable, without status migrainosus: Secondary | ICD-10-CM

## 2020-01-09 DIAGNOSIS — I10 Essential (primary) hypertension: Secondary | ICD-10-CM | POA: Diagnosis not present

## 2020-01-09 DIAGNOSIS — E7800 Pure hypercholesterolemia, unspecified: Secondary | ICD-10-CM

## 2020-01-09 DIAGNOSIS — I25119 Atherosclerotic heart disease of native coronary artery with unspecified angina pectoris: Secondary | ICD-10-CM

## 2020-01-09 DIAGNOSIS — I700 Atherosclerosis of aorta: Secondary | ICD-10-CM

## 2020-01-09 DIAGNOSIS — Z0000 Encounter for general adult medical examination without abnormal findings: Secondary | ICD-10-CM

## 2020-01-09 LAB — POC URINALSYSI DIPSTICK (AUTOMATED)
Bilirubin, UA: NEGATIVE
Blood, UA: NEGATIVE
Glucose, UA: NEGATIVE
Ketones, UA: NEGATIVE
Leukocytes, UA: NEGATIVE
Nitrite, UA: NEGATIVE
Protein, UA: NEGATIVE
Spec Grav, UA: 1.015 (ref 1.010–1.025)
Urobilinogen, UA: 0.2 E.U./dL
pH, UA: 5.5 (ref 5.0–8.0)

## 2020-01-09 LAB — LIPID PANEL
Cholesterol: 164 mg/dL (ref 0–200)
HDL: 58.1 mg/dL (ref >39.00)
LDL Cholesterol: 84 mg/dL (ref 0–99)
NonHDL: 105.54
Total CHOL/HDL Ratio: 3
Triglycerides: 110 mg/dL (ref 0.0–149.0)
VLDL: 22 mg/dL (ref 0.0–40.0)

## 2020-01-09 LAB — CBC WITH DIFFERENTIAL/PLATELET
Basophils Absolute: 0 10*3/uL (ref 0.0–0.1)
Basophils Relative: 0.7 % (ref 0.0–3.0)
Eosinophils Absolute: 0.3 10*3/uL (ref 0.0–0.7)
Eosinophils Relative: 6.4 % — ABNORMAL HIGH (ref 0.0–5.0)
HCT: 41.3 % (ref 36.0–46.0)
Hemoglobin: 13.7 g/dL (ref 12.0–15.0)
Lymphocytes Relative: 31.7 % (ref 12.0–46.0)
Lymphs Abs: 1.6 10*3/uL (ref 0.7–4.0)
MCHC: 33.1 g/dL (ref 30.0–36.0)
MCV: 98.2 fl (ref 78.0–100.0)
Monocytes Absolute: 0.5 10*3/uL (ref 0.1–1.0)
Monocytes Relative: 9 % (ref 3.0–12.0)
Neutro Abs: 2.7 10*3/uL (ref 1.4–7.7)
Neutrophils Relative %: 52.2 % (ref 43.0–77.0)
Platelets: 226 10*3/uL (ref 150.0–400.0)
RBC: 4.21 Mil/uL (ref 3.87–5.11)
RDW: 14.1 % (ref 11.5–15.5)
WBC: 5.1 10*3/uL (ref 4.0–10.5)

## 2020-01-09 LAB — COMPREHENSIVE METABOLIC PANEL
ALT: 17 U/L (ref 0–35)
AST: 20 U/L (ref 0–37)
Albumin: 4.3 g/dL (ref 3.5–5.2)
Alkaline Phosphatase: 85 U/L (ref 39–117)
BUN: 13 mg/dL (ref 6–23)
CO2: 32 mEq/L (ref 19–32)
Calcium: 9.9 mg/dL (ref 8.4–10.5)
Chloride: 101 mEq/L (ref 96–112)
Creatinine, Ser: 1.03 mg/dL (ref 0.40–1.20)
GFR: 63.52 mL/min (ref >60.00)
Glucose, Bld: 90 mg/dL (ref 70–99)
Potassium: 4.1 mEq/L (ref 3.5–5.1)
Sodium: 138 mEq/L (ref 135–145)
Total Bilirubin: 0.7 mg/dL (ref 0.2–1.2)
Total Protein: 7.9 g/dL (ref 6.0–8.3)

## 2020-01-09 LAB — TSH: TSH: 2.85 u[IU]/mL (ref 0.35–4.50)

## 2020-01-09 MED ORDER — VENLAFAXINE HCL ER 150 MG PO CP24: ORAL_CAPSULE | ORAL | 5 refills | Status: DC

## 2020-01-09 NOTE — Patient Instructions (Addendum)
Health Maintenance Due  Topic Date Due  . MAMMOGRAM  Will call to make app  Never done   . Please make sure to get your colonoscopy done    Please stop by lab and x-ray before you go If you do not have mychart- we will call you about results within 5 business days of [place] receiving them.  If you have mychart- we will send your results within 3 business days of [place] receiving them.  If abnormal or we want to clarify a result, we will call or mychart you to make sure you receive the message.  If you have questions or concerns or don't hear within 5 business days, please send [place] a message or call us.   You ageed if pain continues to either chat with your pain management doctor for further workup or we can consider sports medicine or orthopedic follow up      Recommended follow up: No follow-ups on file.   You have an appointment scheduled for: []   2D Mammogram  [x]   3D Mammogram  []   Bone Density   Call for appointment    Your appointment will at the following location  [x]   [facility]      [address] [place],                  Make sure to wear two peace clothing  No lotions powders or deodorants the day of the appointment Make sure to bring picture ID and insurance card.  Bring list of medications you are currently taking including any supplements.

## 2020-01-09 NOTE — Assessment & Plan Note (Signed)
Patient states she is seeing a pain management specialist and is on Percocet for her migraines.  She has been on venlafaxine to help reduce the frequency-this was refilled today-you may also have some underlying anxiety and this is likely beneficial in that arena

## 2020-01-09 NOTE — Progress Notes (Signed)
Phone [phone] In person visit   Subjective:   [name] is a 74 y.o. year old very pleasant female patient who presents for/with See problem oriented charting Chief Complaint  Patient presents with  . Back Pain    x 1 mo     This visit occurred during the SARS-CoV-2 public health emergency.  Safety protocols were in place, including screening questions prior to the visit, additional usage of staff PPE, and extensive cleaning of exam room while observing appropriate contact time as indicated for disinfecting solutions.   Past Medical History-  Patient Active Problem List   Diagnosis Date Noted  . Heart failure with reduced ejection fraction (HCC) 08/21/2019    Priority: High  . COVID-19 08/07/2019    Priority: High  . Hyperthyroidism 05/22/2017    Priority: High  . Coronary artery disease involving native coronary artery of native heart with angina pectoris [facility])     Priority: High  . Seizures (HCC)     Priority: High  . Demand ischemia (HCC)     Priority: Medium  . Overactive bladder 09/19/2014    Priority: Medium  . Hypercholesteremia     Priority: Medium  . GERD (gastroesophageal reflux disease) 10/04/2011    Priority: Medium  . HTN (hypertension) 10/01/2011    Priority: Medium  . Arthritis     Priority: Low  . Anemia 12/22/2017    Medications- reviewed and updated Current Outpatient Medications  Medication Sig Dispense Refill  . amLODipine (NORVASC) 10 MG tablet Take 5 mg by mouth daily.    . ASPIRIN LOW DOSE PO Take 81 mg by mouth daily.    Marland Kitchen atorvastatin (LIPITOR) 20 MG tablet Take 1 tablet (20 mg total) by mouth daily at 6 PM. 90 tablet 3  . cholecalciferol (VITAMIN D) 400 UNITS TABS tablet Take 400 Units daily by mouth.     . esomeprazole (NEXIUM) 40 MG capsule TAKE 1 CAPSULE(40 MG) BY MOUTH DAILY 90 capsule 2  . ezetimibe (ZETIA) 10 MG tablet TAKE 1 TABLET(10 MG) BY MOUTH DAILY 90 tablet 2  . Ferrous Sulfate Dried (EQ SLOW-RELEASE IRON) 45 MG TBCR  Take 1 tablet by mouth daily. 90 tablet 1  . furosemide (LASIX) 20 MG tablet Take 1 tablet (20 mg total) by mouth daily. 90 tablet 3  . ipratropium (ATROVENT HFA) 17 MCG/ACT inhaler Inhale 2 puffs into the lungs every 8 (eight) hours. 1 Inhaler 12  . isosorbide mononitrate (IMDUR) 120 MG 24 hr tablet Take 1 tablet (120 mg total) by mouth daily. 90 tablet 1  . levalbuterol (XOPENEX HFA) 45 MCG/ACT inhaler Inhale 2 puffs into the lungs every 8 (eight) hours. 1 Inhaler 12  . loperamide (IMODIUM) 2 MG capsule Take 1 capsule (2 mg total) by mouth as needed for diarrhea or loose stools. 30 capsule 0  . losartan (COZAAR) 25 MG tablet Take 25 mg by mouth daily.    . methimazole (TAPAZOLE) 10 MG tablet TAKE 4 TABLETS BY MOUTH TWICE DAILY 240 tablet 0  . metoprolol succinate (TOPROL-XL) 25 MG 24 hr tablet Take 1 tablet (25 mg total) by mouth daily. 90 tablet 1  . nitroGLYCERIN (NITROSTAT) 0.4 MG SL tablet PLACE 1 TABLET UNDER THE TONGUE EVERY 5 MINUTES FOR CHEST PAIN FOR 3 DOSES 25 tablet 0  . oxybutynin (DITROPAN-XL) 10 MG 24 hr tablet TAKE 1 TABLET BY MOUTH ONCE DAILY 90 tablet 3  . oxyCODONE-acetaminophen (PERCOCET) 10-325 MG tablet Take 1 tablet by mouth 2 (two) times daily as  needed.    . potassium chloride (MICRO-K) 10 MEQ CR capsule TAKE 1 CAPSULE BY MOUTH TWICE DAILY 180 capsule 1  . Prenatal Vit-Fe Fumarate-FA (PRENATAL MULTIVITAMIN) TABS tablet Take 1 tablet by mouth daily at 12 noon.    . venlafaxine XR (EFFEXOR-XR) 150 MG 24 hr capsule TAKE 1 CAPSULE BY MOUTH EVERY NIGHT AT BEDTIME 30 capsule 5  . vitamin C (VITAMIN C) 500 MG tablet Take 1 tablet (500 mg total) by mouth 2 (two) times daily. 14 tablet 0  . Vitamin E 180 MG CAPS Take 2 capsules by mouth daily.    Marland Kitchen zinc sulfate 220 (50 Zn) MG capsule Take 1 capsule (220 mg total) by mouth daily. 7 capsule 0   No current facility-administered medications for this visit.     Objective:  BP 132/76   Pulse 76   Temp 98.2 F (36.8 C) (Temporal)    Ht 5\' 6"  (1.676 m)   Wt 153 lb (69.4 kg)   SpO2 98%   BMI 24.69 kg/m  Gen: NAD, resting comfortably CV: RRR no murmurs rubs or gallops Lungs: CTAB no crackles, wheeze, rhonchi Ext: no edema Skin: warm, dry MSK: No pain with palpation over midline-has some paraspinous muscle tenderness.  Straight leg raise negative.  Good strength in lower legs.  No paresthesias-sensation intact to gross touch    Assessment and Plan    # Lower Back Pain S:Pain rating, quality, Location-lower back pain up to 10/10- off the chart per her. Steady ache.  Started, duration-2 months ago intensified. Has had of and on issues for a long time.  Worse with )-states not better or worse with position changes.  Relieved by-percocet from her pain management doctor- she states was given these for headaches from her doctor but has tried just a few for her low back and was helpful.  No longer on tramadol Previous Treatment-home percocet Previous imaging-lumbar spine films 01/20/2018 which showed degenerative disc disease at L5-s1  ROS-No saddle anesthesia, fecal incontinence, weakness in extremity, numbness or tingling in extremity. History negative for trauma, history of cancer, fever, chills, unintentional weight loss, recent bacterial infection, recent IV drug use, HIV, pain worse at night or while supine. Does have some incontinence which is worse lately. Has had weight gain actually.  A/P: 74 year old female with known degenerative disc disease and issues with intermittent low back pain now with significant worsening of back pain over the last 2 months requiring narcotics at times from her pain management doctor.  She is primarily concerned about something being wrong with her kidneys-we will check a CMP as well as urinalysis today.  We will also check lumbar films with pain ongoing now for 2 months at high level to rule out something like compression fracture -Occasionally TSH being off can cause issues and we will  update that time she reports she is on3 tablets twice a day methimazole.  Hyperthyroidism have been well managed in the past by Dr. 65 stable today -Reports she has been seen by Dr. Katrinka Blazing in the past with [facility]-I encouraged her to follow up with him if symptoms fail to improve -Get urinalysis to to evaluate for any obvious UTI-patient has baseline incontinence on oxybutynin  #Anemia-mildly anemic at last visit and encouraged colonoscopy strongly-she states she is going to call to schedule her GI follow-up at this time.  #hyperlipidemia/aortic atherosclerosis/CAD S: compliant with atorvastatin 20 mg daily she believes.  Also compliant with aspirin  She reports significant improvement in frequency of  chest pain since going up on Imdur to 120 mg. Lab Results  Component Value Date   CHOL 155 12/30/2018   HDL 51.40 12/30/2018   LDLCALC 92 12/30/2018   TRIG 56.0 12/30/2018   CHOLHDL 3 12/30/2018   A/P: Hopefully remains controlled-update lipid panel today-prefer LDL under 70 if possible given aortic atherosclerosis and CAD though most of her issues are vasospasm related  Angina related to likely vasospasm has improved on Imdur-patient agrees to follow-up with cardiology if symptoms worsen again  #Seizure history-history of seizures in the past but no recent issues-not on any antiepileptics-stable-continue current medications  Recommended follow up: Patient feels like coming in for follow-up to go over lab results and x-ray results in the next few weeks would be helpful for her-she prefers in person explanations.  I offered to call her but she still preferred to come in-she is going to schedule this before she leaves Future Appointments  Date Time Provider [place]  02/09/2020 10:40 AM Belva Crome, MD [facility]    Lab/Order associations:   ICD-10-CM   1. Chronic bilateral low back pain without sciatica  M54.5 DG Lumbar Spine  Complete   G89.29 Comprehensive metabolic panel    POCT Urinalysis Dipstick (Automated)  2. Essential hypertension  I10 CBC with Differential/Platelet  3. Hyperthyroidism  E05.90 TSH  4. Hypercholesteremia  E78.00 Lipid panel  5. Coronary artery disease involving native coronary artery of native heart with angina pectoris ([place])  I25.119   6. Seizures ([place]) Chronic R56.9     Meds ordered this encounter  Medications  . venlafaxine XR (EFFEXOR-XR) 150 MG 24 hr capsule    Sig: TAKE 1 CAPSULE BY MOUTH EVERY NIGHT AT BEDTIME    Dispense:  30 capsule    Refill:  5   Return precautions advised.  Garret Reddish, MD

## 2020-01-09 NOTE — Progress Notes (Signed)
Subjective:   [name] is a 74 y.o. female who presents for Medicare Annual (Subsequent) preventive examination.  Review of Systems:   Cardiac Risk Factors include: advanced age (>5men, >46 women);dyslipidemia;hypertension    Objective:     Vitals: BP 132/76   Temp 98.2 F (36.8 C) (Temporal)   Ht 5\' 6"  (1.676 m)   Wt 153 lb (69.4 kg)   BMI 24.69 kg/m   Body mass index is 24.69 kg/m.  Advanced Directives 01/09/2020 08/05/2019 12/01/2018 07/10/2018 09/30/2017 05/23/2017 05/22/2017  Does Patient Have a Medical Advance Directive? No No No No No No No  Would patient like information on creating a medical advance directive? Yes (MAU/Ambulatory/Procedural Areas - Information given) Yes (ED - Information included in AVS) No - Patient declined No - Patient declined No - Patient declined No - Patient declined -  Pre-existing out of facility DNR order (yellow form or pink MOST form) - - - - - - -    Tobacco Social History   Tobacco Use  Smoking Status Never Smoker  Smokeless Tobacco Never Used     Counseling given: Not Answered   Clinical Intake:  Pre-visit preparation completed: Yes  Diabetes: No  How often do you need to have someone help you when you read instructions, pamphlets, or other written materials from your doctor or pharmacy?: 1 - Never  Interpreter Needed?: No  Information entered by :: [email or web address] LPN  Past Medical History:  Diagnosis Date  . Anemia 12/22/2017  . Arthritis   . Chronic lower back pain   . Cluster headache   . Coronary artery disease    a. 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 non obs dz LHC 2007. c. Non obs dz (mild-mod) by [facility] 05/17/14 d. cath 05/26/17 mild non obstructive CAD  . COVID-19 08/07/2019  . Diverticulitis    a. Hx microperf 2012 - hospitalizated.  2013 GERD (gastroesophageal reflux disease)   . Hypercholesteremia   . Hypertension   . Overactive bladder 09/19/2014   Oxybutynin 5mg  XL--> 10mg   . PVC's (premature ventricular  contractions)    a. Hx of trigeminy/bigeminy.  . Seizures (HCC)   . Thyroid disease    hyperthyroid  . UTI (lower urinary tract infection)    Past Surgical History:  Procedure Laterality Date  . ABDOMINAL HYSTERECTOMY  1987   "partial"-still has ovaries  . CARDIAC CATHETERIZATION  05/17/2014  . CORONARY ANGIOPLASTY WITH STENT PLACEMENT  1995   "1"  . JOINT REPLACEMENT     right knee  . LEFT HEART CATH AND CORONARY ANGIOGRAPHY N/A 05/26/2017   Procedure: Left Heart Cath and Coronary Angiography;  Surgeon: , MD;  Location: [facility] INVASIVE CV LAB;  Service: Cardiovascular;  Laterality: N/A;  . LEFT HEART CATHETERIZATION WITH CORONARY ANGIOGRAM N/A 05/17/2014   Procedure: LEFT HEART CATHETERIZATION WITH CORONARY ANGIOGRAM;  Surgeon: Corky Crafts, MD;  Location: [facility] CATH LAB;  Service: Cardiovascular;  Laterality: N/A;  . TOTAL KNEE ARTHROPLASTY Right 2005  . TUBAL LIGATION  1970  . VASCULAR SURGERY     Family History  Problem Relation Age of Onset  . CAD Brother   . Diabetes Brother   . CAD Father   . Diabetes Father   . Diabetes Mother        father, sister, brothers  . Diabetes Sister   . Thyroid disease Sister    Social History   Socioeconomic History  . Marital status: Married    Spouse name: Not on  file  . Number of children: 4  . Years of education: 4  . Highest education level: Not on file  Occupational History    Comment: retired  Tobacco Use  . Smoking status: Never Smoker  . Smokeless tobacco: Never Used  Substance and Sexual Activity  . Alcohol use: No  . Drug use: No  . Sexual activity: Never  Other Topics Concern  . Not on file  Social History Narrative   Separated. 4 children from first marriage. 16 grandkids.       Retired from [employer] for 25 years, went to [employer] for 5 years. Retired 2003 after MI      Hobbies: babysit/active with children      Right-handed      Caffeine: 24 oz soda per day      Social Determinants of Health    Financial Resource Strain:   . Difficulty of Paying Living Expenses:   Food Insecurity:   . Worried About Programme researcher, broadcasting/film/video in the Last Year:   . Barista in the Last Year:   Transportation Needs:   . Freight forwarder (Medical):   Marland Kitchen Lack of Transportation (Non-Medical):   Physical Activity:   . Days of Exercise per Week:   . Minutes of Exercise per Session:   Stress:   . Feeling of Stress :   Social Connections:   . Frequency of Communication with Friends and Family:   . Frequency of Social Gatherings with Friends and Family:   . Attends Religious Services:   . Active Member of Clubs or Organizations:   . Attends Banker Meetings:   Marland Kitchen Marital Status:     Outpatient Encounter Medications as of 01/09/2020  Medication Sig  . amLODipine (NORVASC) 10 MG tablet Take 5 mg by mouth daily.  . ASPIRIN LOW DOSE PO Take 81 mg by mouth daily.  Marland Kitchen atorvastatin (LIPITOR) 20 MG tablet Take 1 tablet (20 mg total) by mouth daily at 6 PM.  . cholecalciferol (VITAMIN D) 400 UNITS TABS tablet Take 400 Units daily by mouth.   . esomeprazole (NEXIUM) 40 MG capsule TAKE 1 CAPSULE(40 MG) BY MOUTH DAILY  . ezetimibe (ZETIA) 10 MG tablet TAKE 1 TABLET(10 MG) BY MOUTH DAILY  . Ferrous Sulfate Dried (EQ SLOW-RELEASE IRON) 45 MG TBCR Take 1 tablet by mouth daily.  . furosemide (LASIX) 20 MG tablet Take 1 tablet (20 mg total) by mouth daily.  Marland Kitchen ipratropium (ATROVENT HFA) 17 MCG/ACT inhaler Inhale 2 puffs into the lungs every 8 (eight) hours.  . isosorbide mononitrate (IMDUR) 120 MG 24 hr tablet Take 1 tablet (120 mg total) by mouth daily.  Marland Kitchen levalbuterol (XOPENEX HFA) 45 MCG/ACT inhaler Inhale 2 puffs into the lungs every 8 (eight) hours.  Marland Kitchen loperamide (IMODIUM) 2 MG capsule Take 1 capsule (2 mg total) by mouth as needed for diarrhea or loose stools.  Marland Kitchen losartan (COZAAR) 25 MG tablet Take 25 mg by mouth daily.  . methimazole (TAPAZOLE) 10 MG tablet TAKE 4 TABLETS BY MOUTH TWICE  DAILY  . metoprolol succinate (TOPROL-XL) 25 MG 24 hr tablet Take 1 tablet (25 mg total) by mouth daily.  . nitroGLYCERIN (NITROSTAT) 0.4 MG SL tablet PLACE 1 TABLET UNDER THE TONGUE EVERY 5 MINUTES FOR CHEST PAIN FOR 3 DOSES  . oxybutynin (DITROPAN-XL) 10 MG 24 hr tablet TAKE 1 TABLET BY MOUTH ONCE DAILY  . oxyCODONE-acetaminophen (PERCOCET) 10-325 MG tablet Take 1 tablet by mouth 2 (two) times  daily as needed.  . potassium chloride (MICRO-K) 10 MEQ CR capsule TAKE 1 CAPSULE BY MOUTH TWICE DAILY  . Prenatal Vit-Fe Fumarate-FA (PRENATAL MULTIVITAMIN) TABS tablet Take 1 tablet by mouth daily at 12 noon.  . venlafaxine XR (EFFEXOR-XR) 150 MG 24 hr capsule TAKE 1 CAPSULE BY MOUTH EVERY NIGHT AT BEDTIME  . vitamin C (VITAMIN C) 500 MG tablet Take 1 tablet (500 mg total) by mouth 2 (two) times daily.  . Vitamin E 180 MG CAPS Take 2 capsules by mouth daily.  Marland Kitchen zinc sulfate 220 (50 Zn) MG capsule Take 1 capsule (220 mg total) by mouth daily.   No facility-administered encounter medications on file as of 01/09/2020.    Activities of Daily Living In your present state of health, do you have any difficulty performing the following activities: 01/09/2020 08/06/2019  Hearing? N N  Vision? N N  Difficulty concentrating or making decisions? N N  Walking or climbing stairs? N Y  Dressing or bathing? N Y  Doing errands, shopping? N N  Preparing Food and eating ? N -  Using the Toilet? N -  In the past six months, have you accidently leaked urine? N -  Do you have problems with loss of bowel control? N -  Managing your Medications? N -  Managing your Finances? N -  Housekeeping or managing your Housekeeping? N -  Some recent data might be hidden    Patient Care Team: Shelva Majestic, MD as PCP - General (Family Medicine) Lyn Records, MD as PCP - Cardiology (Cardiology)    Assessment:   This is a routine wellness examination for [place].  Exercise Activities and Dietary recommendations Current  Exercise Habits: The patient does not participate in regular exercise at present  Goals   None     Fall Risk Fall Risk  01/09/2020 07/06/2018 04/26/2017 08/17/2016 05/16/2015  Falls in the past year? 0 No No No No  Number falls in past yr: 0 - - - -  Injury with Fall? 0 - - - -   Is the patient's home free of loose throw rugs in walkways, pet beds, electrical cords, etc?   yes      Grab bars in the bathroom? yes      Handrails on the stairs?   yes      Adequate lighting?   yes  Timed Get Up and Go performed: completed and within normal timeframe; no gait abnormalities noted   Depression Screen PHQ 2/9 Scores 01/09/2020 01/09/2020 10/13/2019 07/06/2018  PHQ - 2 Score 2 2 0 0  PHQ- 9 Score 5 6 - -     Cognitive Function     6CIT Screen 01/09/2020  What Year? 0 points  What month? 0 points  What time? 0 points  Count back from 20 0 points  Months in reverse 0 points  Repeat phrase 0 points  Total Score 0    Immunization History  Administered Date(s) Administered  . Influenza, High Dose Seasonal PF 08/17/2016, 08/06/2017  . Influenza,inj,Quad PF,6+ Mos 10/11/2015  . Pneumococcal Conjugate-13 10/11/2015  . Pneumococcal Polysaccharide-23 11/19/2016  . Td 12/25/1995    Qualifies for Shingles Vaccine? Discussed and patient will check with pharmacy for coverage.  Patient education handout provided   Screening Tests Health Maintenance  Topic Date Due  . MAMMOGRAM  Never done  . COLONOSCOPY  01/09/2020 (Originally 10/29/1996)  . INFLUENZA VACCINE  01/24/2020 (Originally 05/27/2019)  . DEXA SCAN  01/08/2021 (Originally 10/30/2011)  .  TETANUS/TDAP  01/08/2021 (Originally 12/24/2005)  . Hepatitis C Screening  Completed  . PNA vac Low Risk Adult  Completed    Cancer Screenings: Lung: Low Dose CT Chest recommended if Age 58-80 years, 30 pack-year currently smoking OR have quit w/in 15years. Patient does not qualify. Breast:  Up to date on Mammogram? No; information provided to patient  to schedule   Up to date of Bone Density/Dexa? No; states that she will schedule with [facility]  Colorectal: patient to call and schedule appointment     Plan:  I have personally reviewed and addressed the Medicare Annual Wellness questionnaire and have noted the following in the patient's chart:  A. Medical and social history B. Use of alcohol, tobacco or illicit drugs  C. Current medications and supplements D. Functional ability and status E.  Nutritional status F.  Physical activity G. Advance directives H. List of other physicians I.  Hospitalizations, surgeries, and ER visits in previous 12 months J.  [place] such as hearing and vision if needed, cognitive and depression L. Referrals, records requested, and appointments- patient will self schedule GI, gyn, and mammogram   In addition, I have reviewed and discussed with patient certain preventive protocols, quality metrics, and best practice recommendations. A written personalized care plan for preventive services as well as general preventive health recommendations were provided to patient.   Signed,  Denman George, LPN  Nurse Health Advisor   Nurse Notes: no additional

## 2020-01-09 NOTE — Addendum Note (Signed)
Addended by: Young Berry T on: 01/09/2020 01:50 PM   Modules accepted: Orders

## 2020-01-09 NOTE — Patient Instructions (Signed)
[name] , Thank you for taking time to come for your Medicare Wellness Visit. I appreciate your ongoing commitment to your health goals. Please review the following plan we discussed and let me know if I can assist you in the future.   Screening recommendations/referrals: Colorectal Screening: recommended repeat colonoscopy; please call your GI to schedule  Mammogram: recommended; see handout to schedule  Bone Density: recommended; see handout to schedule  Vision and Dental Exams: Recommended annual ophthalmology exams for early detection of glaucoma and other disorders of the eye Recommended annual dental exams for proper oral hygiene  Vaccinations: Influenza vaccine: recommended each fall  Pneumococcal vaccine: up to date; last 11/19/16 Tdap vaccine: recommended; Please call your insurance company to determine your out of pocket expense. You may receive this vaccine at your local pharmacy or Health Dept. Shingles vaccine:  You may receive this vaccine at your local pharmacy. (see handout)   Advanced directives: Please bring a copy of your POA (Power of Attorney) and/or Living Will to your next appointment.  Goals: Recommend to drink at least 6-8 8oz glasses of water per day and consume a balanced diet rich in fresh fruits and vegetables.   Next appointment: Please schedule your Annual Wellness Visit with your Nurse Health Advisor in one year.  Preventive Care 73 Years and Older, Female Preventive care refers to lifestyle choices and visits with your health care provider that can promote health and wellness. What does preventive care include?  A yearly physical exam. This is also called an annual well check.  Dental exams once or twice a year.  Routine eye exams. Ask your health care provider how often you should have your eyes checked.  Personal lifestyle choices, including:  Daily care of your teeth and gums.  Regular physical activity.  Eating a healthy  diet.  Avoiding tobacco and drug use.  Limiting alcohol use.  Practicing safe sex.  Taking low-dose aspirin every day if recommended by your health care provider.  Taking vitamin and mineral supplements as recommended by your health care provider. What happens during an annual well check? The services and screenings done by your health care provider during your annual well check will depend on your age, overall health, lifestyle risk factors, and family history of disease. Counseling  Your health care provider may ask you questions about your:  Alcohol use.  Tobacco use.  Drug use.  Emotional well-being.  Home and relationship well-being.  Sexual activity.  Eating habits.  History of falls.  Memory and ability to understand (cognition).  Work and work Astronomer.  Reproductive health. Screening  You may have the following tests or measurements:  Height, weight, and BMI.  Blood pressure.  Lipid and cholesterol levels. These may be checked every 5 years, or more frequently if you are over 73 years old.  Skin check.  Lung cancer screening. You may have this screening every year starting at age 73 if you have a 30-pack-year history of smoking and currently smoke or have quit within the past 15 years.  Fecal occult blood test (FOBT) of the stool. You may have this test every year starting at age 73.  Flexible sigmoidoscopy or colonoscopy. You may have a sigmoidoscopy every 5 years or a colonoscopy every 10 years starting at age 73.  Hepatitis C blood test.  Hepatitis B blood test.  Sexually transmitted disease (STD) testing.  Diabetes screening. This is done by checking your blood sugar (glucose) after you have not eaten for a  while (fasting). You may have this done every 1-3 years.  Bone density scan. This is done to screen for osteoporosis. You may have this done starting at age 30.  Mammogram. This may be done every 1-2 years. Talk to your health care  provider about how often you should have regular mammograms. Talk with your health care provider about your test results, treatment options, and if necessary, the need for more tests. Vaccines  Your health care provider may recommend certain vaccines, such as:  Influenza vaccine. This is recommended every year.  Tetanus, diphtheria, and acellular pertussis (Tdap, Td) vaccine. You may need a Td booster every 10 years.  Zoster vaccine. You may need this after age 73.  Pneumococcal 13-valent conjugate (PCV13) vaccine. One dose is recommended after age 73.  Pneumococcal polysaccharide (PPSV23) vaccine. One dose is recommended after age 73. Talk to your health care provider about which screenings and vaccines you need and how often you need them. This information is not intended to replace advice given to you by your health care provider. Make sure you discuss any questions you have with your health care provider. Document Released: 11/08/2015 Document Revised: 07/01/2016 Document Reviewed: 08/13/2015 Elsevier Interactive Patient Education  2017 [place] Prevention in the Home Falls can cause injuries. They can happen to people of all ages. There are many things you can do to make your home safe and to help prevent falls. What can I do on the outside of my home?  Regularly fix the edges of walkways and driveways and fix any cracks.  Remove anything that might make you trip as you walk through a door, such as a raised step or threshold.  Trim any bushes or trees on the path to your home.  Use bright outdoor lighting.  Clear any walking paths of anything that might make someone trip, such as rocks or tools.  Regularly check to see if handrails are loose or broken. Make sure that both sides of any steps have handrails.  Any raised decks and porches should have guardrails on the edges.  Have any leaves, snow, or ice cleared regularly.  Use sand or salt on walking paths  during winter.  Clean up any spills in your garage right away. This includes oil or grease spills. What can I do in the bathroom?  Use night lights.  Install grab bars by the toilet and in the tub and shower. Do not use towel bars as grab bars.  Use non-skid mats or decals in the tub or shower.  If you need to sit down in the shower, use a plastic, non-slip stool.  Keep the floor dry. Clean up any water that spills on the floor as soon as it happens.  Remove soap buildup in the tub or shower regularly.  Attach bath mats securely with double-sided non-slip rug tape.  Do not have throw rugs and other things on the floor that can make you trip. What can I do in the bedroom?  Use night lights.  Make sure that you have a light by your bed that is easy to reach.  Do not use any sheets or blankets that are too big for your bed. They should not hang down onto the floor.  Have a firm chair that has side arms. You can use this for support while you get dressed.  Do not have throw rugs and other things on the floor that can make you trip. What can I do in the  kitchen?  Clean up any spills right away.  Avoid walking on wet floors.  Keep items that you use a lot in easy-to-reach places.  If you need to reach something above you, use a strong step stool that has a grab bar.  Keep electrical cords out of the way.  Do not use floor polish or wax that makes floors slippery. If you must use wax, use non-skid floor wax.  Do not have throw rugs and other things on the floor that can make you trip. What can I do with my stairs?  Do not leave any items on the stairs.  Make sure that there are handrails on both sides of the stairs and use them. Fix handrails that are broken or loose. Make sure that handrails are as long as the stairways.  Check any carpeting to make sure that it is firmly attached to the stairs. Fix any carpet that is loose or worn.  Avoid having throw rugs at the top  or bottom of the stairs. If you do have throw rugs, attach them to the floor with carpet tape.  Make sure that you have a light switch at the top of the stairs and the bottom of the stairs. If you do not have them, ask someone to add them for you. What else can I do to help prevent falls?  Wear shoes that:  Do not have high heels.  Have rubber bottoms.  Are comfortable and fit you well.  Are closed at the toe. Do not wear sandals.  If you use a stepladder:  Make sure that it is fully opened. Do not climb a closed stepladder.  Make sure that both sides of the stepladder are locked into place.  Ask someone to hold it for you, if possible.  Clearly mark and make sure that you can see:  Any grab bars or handrails.  First and last steps.  Where the edge of each step is.  Use tools that help you move around (mobility aids) if they are needed. These include:  Canes.  Walkers.  Scooters.  Crutches.  Turn on the lights when you go into a dark area. Replace any light bulbs as soon as they burn out.  Set up your furniture so you have a clear path. Avoid moving your furniture around.  If any of your floors are uneven, fix them.  If there are any pets around you, be aware of where they are.  Review your medicines with your doctor. Some medicines can make you feel dizzy. This can increase your chance of falling. Ask your doctor what other things that you can do to help prevent falls. This information is not intended to replace advice given to you by your health care provider. Make sure you discuss any questions you have with your health care provider. Document Released: 08/08/2009 Document Revised: 03/19/2016 Document Reviewed: 11/16/2014 Elsevier Interactive Patient Education  2017 [employer].

## 2020-01-16 ENCOUNTER — Other Ambulatory Visit: Payer: Self-pay

## 2020-01-16 MED ORDER — ROSUVASTATIN CALCIUM 20 MG PO TABS: 20.0000 mg | ORAL_TABLET | Freq: Every day | ORAL | 3 refills | Status: DC

## 2020-01-17 ENCOUNTER — Other Ambulatory Visit: Payer: Self-pay

## 2020-01-17 DIAGNOSIS — E0590 Thyrotoxicosis, unspecified without thyrotoxic crisis or storm: Secondary | ICD-10-CM

## 2020-01-17 MED ORDER — METHIMAZOLE 10 MG PO TABS: 40.0000 mg | ORAL_TABLET | Freq: Two times a day (BID) | ORAL | 0 refills | Status: DC

## 2020-01-17 NOTE — Telephone Encounter (Signed)
Outpatient Medication Detail   Disp Refills Start End   methimazole (TAPAZOLE) 10 MG tablet 104 tablet 0 01/17/2020    Sig - Route: Take 4 tablets (40 mg total) by mouth 2 (two) times daily. OVERDUE FOR APPT. WILL PROVIDE SUPPLY UNTIL 01/30/20 FOLLOW UP APPT AND REPEAT LABS - Oral   Sent to pharmacy as: methimazole (TAPAZOLE) 10 MG tablet   E-Prescribing Status: Receipt confirmed by pharmacy (01/17/2020  4:29 PM EDT)

## 2020-01-17 NOTE — Patient Outreach (Signed)
[facility]) Care Management  01/17/2020  [name] 10-26-1946 [id number]   Medication Adherence call to Mrs. [employer] HIPPA Compliant Voice message left with a call back number. [name] is showing past due on Losartan 25 mg under [facility] Ins.   Lillia Abed CPhT Pharmacy Technician [facility] Management Direct Dial [phone]  Fax [phone] [email or web address]

## 2020-01-17 NOTE — Telephone Encounter (Signed)
[facility] [phone] - [place], [place] - [address] AT [facility] [address] Phone:  [phone]  Fax:  [phone]     Patient is now scheduled for April 6th - she's requesting the refill for the methimazole.

## 2020-01-19 DIAGNOSIS — I10 Essential (primary) hypertension: Secondary | ICD-10-CM | POA: Diagnosis not present

## 2020-01-25 ENCOUNTER — Other Ambulatory Visit: Payer: Self-pay

## 2020-01-30 ENCOUNTER — Other Ambulatory Visit: Payer: Self-pay

## 2020-01-30 ENCOUNTER — Encounter: Payer: Self-pay | Admitting: Endocrinology

## 2020-01-30 ENCOUNTER — Ambulatory Visit (INDEPENDENT_AMBULATORY_CARE_PROVIDER_SITE_OTHER): Payer: Medicare Other | Admitting: Endocrinology

## 2020-01-30 DIAGNOSIS — E0590 Thyrotoxicosis, unspecified without thyrotoxic crisis or storm: Secondary | ICD-10-CM | POA: Diagnosis not present

## 2020-01-30 MED ORDER — METHIMAZOLE 10 MG PO TABS: 30.0000 mg | ORAL_TABLET | Freq: Every day | ORAL | 1 refills | Status: DC

## 2020-01-30 NOTE — Patient Instructions (Signed)
Please continue the same medication. If ever you have fever while taking methimazole, stop it and call us, even if the reason is obvious, because of the risk of a rare side-effect. It is best to never miss the medication.  However, if you do miss it, next best is to double up the next time.   Please come back for a follow-up appointment in 3 months.

## 2020-01-30 NOTE — Progress Notes (Signed)
Subjective:    Patient ID: [name], female    DOB: 06/12/1946, 74 y.o.   MRN: [id number]  HPI [name] returns for f/u of hyperthyroidism (dx'ed 2012; [place] was c/w Grave's Dz; tapazole was chosen as rx, due to 2018 MI).  She takes tapazole, 30 mg QD.  She says she seldom misses.  She has slight palpitations in the chest, but no assoc tremor.     Past Medical History:  Diagnosis Date  . Anemia 12/22/2017  . Arthritis   . Chronic lower back pain   . Cluster headache   . Coronary artery disease    a. 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 non obs dz LHC 2007. c. Non obs dz (mild-mod) by [facility] 05/17/14 d. cath 05/26/17 mild non obstructive CAD  . COVID-19 08/07/2019  . Diverticulitis    a. Hx microperf 2012 - hospitalizated.  Marland Kitchen GERD (gastroesophageal reflux disease)   . Hypercholesteremia   . Hypertension   . Overactive bladder 09/19/2014   Oxybutynin 5mg  XL--> 10mg   . PVC's (premature ventricular contractions)    a. Hx of trigeminy/bigeminy.  . Seizures (HCC)   . Thyroid disease    hyperthyroid  . UTI (lower urinary tract infection)     Past Surgical History:  Procedure Laterality Date  . ABDOMINAL HYSTERECTOMY  1987   "partial"-still has ovaries  . CARDIAC CATHETERIZATION  05/17/2014  . CORONARY ANGIOPLASTY WITH STENT PLACEMENT  1995   "1"  . JOINT REPLACEMENT     right knee  . LEFT HEART CATH AND CORONARY ANGIOGRAPHY N/A 05/26/2017   Procedure: Left Heart Cath and Coronary Angiography;  Surgeon: 05/19/2014, MD;  Location: [facility] INVASIVE CV LAB;  Service: Cardiovascular;  Laterality: N/A;  . LEFT HEART CATHETERIZATION WITH CORONARY ANGIOGRAM N/A 05/17/2014   Procedure: LEFT HEART CATHETERIZATION WITH CORONARY ANGIOGRAM;  Surgeon: [facility], MD;  Location: [facility] CATH LAB;  Service: Cardiovascular;  Laterality: N/A;  . TOTAL KNEE ARTHROPLASTY Right 2005  . TUBAL LIGATION  1970  . VASCULAR SURGERY      Social History   Socioeconomic History  . Marital status: Married    Spouse name:  Not on file  . Number of children: 4  . Years of education: 4  . Highest education level: Not on file  Occupational History    Comment: retired  Tobacco Use  . Smoking status: Never Smoker  . Smokeless tobacco: Never Used  Substance and Sexual Activity  . Alcohol use: No  . Drug use: No  . Sexual activity: Never  Other Topics Concern  . Not on file  Social History Narrative   Separated. 4 children from first marriage. 16 grandkids.       Retired from [employer] for 25 years, went to 2006 for 5 years. Retired 2003 after MI      Hobbies: babysit/active with children      Right-handed      Caffeine: 24 oz soda per day      Social Determinants of Health   Financial Resource Strain:   . Difficulty of Paying Living Expenses:   Food Insecurity:   . Worried About [employer] in the Last Year:   . 2004 in the Last Year:   Transportation Needs:   . Programme researcher, broadcasting/film/video (Medical):   Barista Lack of Transportation (Non-Medical):   Physical Activity:   . Days of Exercise per Week:   . Minutes of Exercise per Session:   Stress:   .  Feeling of Stress :   Social Connections:   . Frequency of Communication with Friends and Family:   . Frequency of Social Gatherings with Friends and Family:   . Attends Religious Services:   . Active Member of Clubs or Organizations:   . Attends Archivist Meetings:   Marland Kitchen Marital Status:   Intimate Partner Violence:   . Fear of Current or Ex-Partner:   . Emotionally Abused:   Marland Kitchen Physically Abused:   . Sexually Abused:     Current Outpatient Medications on File Prior to Visit  Medication Sig Dispense Refill  . amLODipine (NORVASC) 10 MG tablet Take 5 mg by mouth daily.    . ASPIRIN LOW DOSE PO Take 81 mg by mouth daily.    Marland Kitchen atorvastatin (LIPITOR) 20 MG tablet Take 1 tablet (20 mg total) by mouth daily at 6 PM. 90 tablet 3  . cholecalciferol (VITAMIN D) 400 UNITS TABS tablet Take 400 Units daily by mouth.     .  esomeprazole (NEXIUM) 40 MG capsule TAKE 1 CAPSULE(40 MG) BY MOUTH DAILY 90 capsule 2  . ezetimibe (ZETIA) 10 MG tablet TAKE 1 TABLET(10 MG) BY MOUTH DAILY 90 tablet 2  . Ferrous Sulfate Dried (EQ SLOW-RELEASE IRON) 45 MG TBCR Take 1 tablet by mouth daily. 90 tablet 1  . furosemide (LASIX) 20 MG tablet Take 1 tablet (20 mg total) by mouth daily. 90 tablet 3  . ipratropium (ATROVENT HFA) 17 MCG/ACT inhaler Inhale 2 puffs into the lungs every 8 (eight) hours. 1 Inhaler 12  . isosorbide mononitrate (IMDUR) 120 MG 24 hr tablet Take 1 tablet (120 mg total) by mouth daily. 90 tablet 1  . levalbuterol (XOPENEX HFA) 45 MCG/ACT inhaler Inhale 2 puffs into the lungs every 8 (eight) hours. 1 Inhaler 12  . loperamide (IMODIUM) 2 MG capsule Take 1 capsule (2 mg total) by mouth as needed for diarrhea or loose stools. 30 capsule 0  . losartan (COZAAR) 25 MG tablet Take 25 mg by mouth daily.    . metoprolol succinate (TOPROL-XL) 25 MG 24 hr tablet Take 1 tablet (25 mg total) by mouth daily. 90 tablet 1  . nitroGLYCERIN (NITROSTAT) 0.4 MG SL tablet PLACE 1 TABLET UNDER THE TONGUE EVERY 5 MINUTES FOR CHEST PAIN FOR 3 DOSES 25 tablet 0  . oxybutynin (DITROPAN-XL) 10 MG 24 hr tablet TAKE 1 TABLET BY MOUTH ONCE DAILY 90 tablet 3  . oxyCODONE-acetaminophen (PERCOCET) 10-325 MG tablet Take 1 tablet by mouth 2 (two) times daily as needed.    . potassium chloride (MICRO-K) 10 MEQ CR capsule TAKE 1 CAPSULE BY MOUTH TWICE DAILY 180 capsule 1  . Prenatal Vit-Fe Fumarate-FA (PRENATAL MULTIVITAMIN) TABS tablet Take 1 tablet by mouth daily at 12 noon.    . rosuvastatin (CRESTOR) 20 MG tablet Take 1 tablet (20 mg total) by mouth daily. 90 tablet 3  . venlafaxine XR (EFFEXOR-XR) 150 MG 24 hr capsule TAKE 1 CAPSULE BY MOUTH EVERY NIGHT AT BEDTIME 30 capsule 5  . vitamin C (VITAMIN C) 500 MG tablet Take 1 tablet (500 mg total) by mouth 2 (two) times daily. 14 tablet 0  . Vitamin E 180 MG CAPS Take 2 capsules by mouth daily.    Marland Kitchen  zinc sulfate 220 (50 Zn) MG capsule Take 1 capsule (220 mg total) by mouth daily. 7 capsule 0   No current facility-administered medications on file prior to visit.    Allergies  Allergen Reactions  . Sulfa Drugs Cross  Reactors Shortness Of Breath and Palpitations    Family History  Problem Relation Age of Onset  . CAD Brother   . Diabetes Brother   . CAD Father   . Diabetes Father   . Diabetes Mother        father, sister, brothers  . Diabetes Sister   . Thyroid disease Sister     BP (!) 148/90   Pulse 90   Ht 5\' 6"  (1.676 m)   Wt 153 lb (69.4 kg)   SpO2 97%   BMI 24.69 kg/m    Review of Systems Denies fever.     Objective:   Physical Exam VITAL SIGNS:  See vs page GENERAL: no distress NECK: thyroid is 3-5 x normal size, diffuse.     Lab Results  Component Value Date   TSH 2.85 01/09/2020       Assessment & Plan:  Hyperthyroidism: well-controlled Palpitations: I told [name] this is not thyroid-related.   Patient Instructions  Please continue the same medication. If ever you have fever while taking methimazole, stop it and call 01/11/2020, even if the reason is obvious, because of the risk of a rare side-effect. It is best to never miss the medication.  However, if you do miss it, next best is to double up the next time.   Please come back for a follow-up appointment in 3 months.

## 2020-02-03 ENCOUNTER — Observation Stay (HOSPITAL_COMMUNITY)
Admission: EM | Admit: 2020-02-03 | Discharge: 2020-02-04 | Disposition: A | Discharge status: Home / Self Care | Payer: Medicare Other | Attending: Family Medicine | Admitting: Family Medicine

## 2020-02-03 ENCOUNTER — Emergency Department (HOSPITAL_COMMUNITY): Payer: Medicare Other

## 2020-02-03 ENCOUNTER — Encounter (HOSPITAL_COMMUNITY): Payer: Self-pay

## 2020-02-03 ENCOUNTER — Other Ambulatory Visit: Payer: Self-pay

## 2020-02-03 DIAGNOSIS — R079 Chest pain, unspecified: Secondary | ICD-10-CM | POA: Diagnosis present

## 2020-02-03 DIAGNOSIS — K219 Gastro-esophageal reflux disease without esophagitis: Secondary | ICD-10-CM | POA: Diagnosis present

## 2020-02-03 DIAGNOSIS — E782 Mixed hyperlipidemia: Secondary | ICD-10-CM | POA: Insufficient documentation

## 2020-02-03 DIAGNOSIS — I25119 Atherosclerotic heart disease of native coronary artery with unspecified angina pectoris: Secondary | ICD-10-CM | POA: Diagnosis not present

## 2020-02-03 DIAGNOSIS — Z20822 Contact with and (suspected) exposure to covid-19: Secondary | ICD-10-CM | POA: Insufficient documentation

## 2020-02-03 DIAGNOSIS — I10 Essential (primary) hypertension: Principal | ICD-10-CM | POA: Diagnosis present

## 2020-02-03 DIAGNOSIS — R252 Cramp and spasm: Secondary | ICD-10-CM | POA: Diagnosis not present

## 2020-02-03 DIAGNOSIS — E039 Hypothyroidism, unspecified: Secondary | ICD-10-CM | POA: Diagnosis not present

## 2020-02-03 DIAGNOSIS — Z888 Allergy status to other drugs, medicaments and biological substances status: Secondary | ICD-10-CM | POA: Insufficient documentation

## 2020-02-03 DIAGNOSIS — Z79899 Other long term (current) drug therapy: Secondary | ICD-10-CM | POA: Insufficient documentation

## 2020-02-03 DIAGNOSIS — R0789 Other chest pain: Secondary | ICD-10-CM | POA: Diagnosis not present

## 2020-02-03 DIAGNOSIS — Z9861 Coronary angioplasty status: Secondary | ICD-10-CM | POA: Insufficient documentation

## 2020-02-03 DIAGNOSIS — E876 Hypokalemia: Secondary | ICD-10-CM

## 2020-02-03 DIAGNOSIS — Z7982 Long term (current) use of aspirin: Secondary | ICD-10-CM | POA: Diagnosis not present

## 2020-02-03 DIAGNOSIS — Z882 Allergy status to sulfonamides status: Secondary | ICD-10-CM | POA: Diagnosis not present

## 2020-02-03 LAB — CBC
HCT: 41.9 % (ref 36.0–46.0)
Hemoglobin: 13.9 g/dL (ref 12.0–15.0)
MCH: 32.6 pg (ref 26.0–34.0)
MCHC: 33.2 g/dL (ref 30.0–36.0)
MCV: 98.1 fL (ref 80.0–100.0)
Platelets: 261 10*3/uL (ref 150–400)
RBC: 4.27 MIL/uL (ref 3.87–5.11)
RDW: 13.3 % (ref 11.5–15.5)
WBC: 4.5 10*3/uL (ref 4.0–10.5)
nRBC: 0 % (ref 0.0–0.2)

## 2020-02-03 LAB — TROPONIN I (HIGH SENSITIVITY)
Troponin I (High Sensitivity): 17 ng/L (ref <18)
Troponin I (High Sensitivity): 16 ng/L (ref <18)
Troponin I (High Sensitivity): 17 ng/L (ref <18)

## 2020-02-03 LAB — BASIC METABOLIC PANEL
Anion gap: 10 (ref 5–15)
BUN: 9 mg/dL (ref 8–23)
CO2: 28 mmol/L (ref 22–32)
Calcium: 9.4 mg/dL (ref 8.9–10.3)
Chloride: 104 mmol/L (ref 98–111)
Creatinine, Ser: 0.93 mg/dL (ref 0.44–1.00)
GFR calc Af Amer: >60 mL/min (ref >60)
GFR calc non Af Amer: >60 mL/min (ref >60)
Glucose, Bld: 104 mg/dL — ABNORMAL HIGH (ref 70–99)
Potassium: 3 mmol/L — ABNORMAL LOW (ref 3.5–5.1)
Sodium: 142 mmol/L (ref 135–145)

## 2020-02-03 LAB — D-DIMER, QUANTITATIVE: D-Dimer, Quant: 0.53 ug/mL-FEU — ABNORMAL HIGH (ref 0.00–0.50)

## 2020-02-03 LAB — SARS CORONAVIRUS 2 (TAT 6-24 HRS): SARS Coronavirus 2: NEGATIVE

## 2020-02-03 LAB — MAGNESIUM: Magnesium: 1.8 mg/dL (ref 1.7–2.4)

## 2020-02-03 MED ORDER — SODIUM CHLORIDE 0.9% FLUSH: 3.0000 mL | Freq: Once | INTRAVENOUS | Status: DC

## 2020-02-03 MED ORDER — POTASSIUM CHLORIDE CRYS ER 20 MEQ PO TBCR
20.0000 meq | EXTENDED_RELEASE_TABLET | Freq: Every day | ORAL | Status: DC
  Administered 2020-02-03 – 2020-02-04 (×2): 20 meq via ORAL
  Filled 2020-02-03 (×2): qty 1

## 2020-02-03 MED ORDER — EZETIMIBE 10 MG PO TABS
10.0000 mg | ORAL_TABLET | Freq: Every day | ORAL | Status: DC
  Administered 2020-02-04: 10 mg via ORAL
  Filled 2020-02-03: qty 1

## 2020-02-03 MED ORDER — METOPROLOL SUCCINATE ER 25 MG PO TB24
25.0000 mg | ORAL_TABLET | Freq: Every day | ORAL | Status: DC
  Administered 2020-02-03 – 2020-02-04 (×2): 25 mg via ORAL
  Filled 2020-02-03 (×2): qty 1

## 2020-02-03 MED ORDER — NITROGLYCERIN 0.4 MG SL SUBL: 0.4000 mg | SUBLINGUAL_TABLET | SUBLINGUAL | Status: DC | PRN

## 2020-02-03 MED ORDER — FUROSEMIDE 20 MG PO TABS
20.0000 mg | ORAL_TABLET | Freq: Every day | ORAL | Status: DC
  Administered 2020-02-04: 20 mg via ORAL
  Filled 2020-02-03: qty 1

## 2020-02-03 MED ORDER — PRENATAL MULTIVITAMIN CH
1.0000 | ORAL_TABLET | Freq: Every day | ORAL | Status: DC
  Filled 2020-02-03: qty 1

## 2020-02-03 MED ORDER — VENLAFAXINE HCL ER 150 MG PO CP24
150.0000 mg | ORAL_CAPSULE | Freq: Every day | ORAL | Status: DC
  Administered 2020-02-04 (×2): 150 mg via ORAL
  Filled 2020-02-03 (×3): qty 1

## 2020-02-03 MED ORDER — AMLODIPINE BESYLATE 5 MG PO TABS
5.0000 mg | ORAL_TABLET | Freq: Every day | ORAL | Status: DC
  Administered 2020-02-04: 5 mg via ORAL
  Filled 2020-02-03: qty 1

## 2020-02-03 MED ORDER — ASPIRIN 81 MG PO TBEC
81.0000 mg | DELAYED_RELEASE_TABLET | Freq: Every day | ORAL | Status: DC
  Filled 2020-02-03: qty 1

## 2020-02-03 MED ORDER — ZINC SULFATE 220 (50 ZN) MG PO CAPS
220.0000 mg | ORAL_CAPSULE | Freq: Every day | ORAL | Status: DC
  Administered 2020-02-04: 10:00:00 220 mg via ORAL
  Filled 2020-02-03: qty 1

## 2020-02-03 MED ORDER — MORPHINE SULFATE (PF) 4 MG/ML IV SOLN
4.0000 mg | Freq: Once | INTRAVENOUS | Status: AC
  Administered 2020-02-03: 4 mg via INTRAVENOUS
  Filled 2020-02-03: qty 1

## 2020-02-03 MED ORDER — ONDANSETRON HCL 4 MG/2ML IJ SOLN: 4.0000 mg | Freq: Four times a day (QID) | INTRAMUSCULAR | Status: DC | PRN

## 2020-02-03 MED ORDER — POTASSIUM CHLORIDE CRYS ER 20 MEQ PO TBCR
40.0000 meq | EXTENDED_RELEASE_TABLET | Freq: Once | ORAL | Status: AC
  Administered 2020-02-03: 40 meq via ORAL
  Filled 2020-02-03: qty 2

## 2020-02-03 MED ORDER — ENOXAPARIN SODIUM 40 MG/0.4ML ~~LOC~~ SOLN
40.0000 mg | SUBCUTANEOUS | Status: DC
  Administered 2020-02-03: 40 mg via SUBCUTANEOUS
  Filled 2020-02-03: qty 0.4

## 2020-02-03 MED ORDER — OXYCODONE-ACETAMINOPHEN 5-325 MG PO TABS
1.0000 | ORAL_TABLET | Freq: Two times a day (BID) | ORAL | Status: DC | PRN
  Administered 2020-02-04: 1 via ORAL
  Filled 2020-02-03: qty 1

## 2020-02-03 MED ORDER — ASPIRIN EC 81 MG PO TBEC
81.0000 mg | DELAYED_RELEASE_TABLET | Freq: Every day | ORAL | Status: DC
  Administered 2020-02-03 – 2020-02-04 (×2): 81 mg via ORAL
  Filled 2020-02-03 (×2): qty 1

## 2020-02-03 MED ORDER — CHOLECALCIFEROL 10 MCG (400 UNIT) PO TABS
400.0000 [IU] | ORAL_TABLET | Freq: Every day | ORAL | Status: DC
  Administered 2020-02-04: 400 [IU] via ORAL
  Filled 2020-02-03: qty 1

## 2020-02-03 MED ORDER — FERROUS SULFATE 325 (65 FE) MG PO TABS
325.0000 mg | ORAL_TABLET | Freq: Every day | ORAL | Status: DC
  Administered 2020-02-04: 325 mg via ORAL
  Filled 2020-02-03: qty 1

## 2020-02-03 MED ORDER — ACETAMINOPHEN 325 MG PO TABS
650.0000 mg | ORAL_TABLET | ORAL | Status: DC | PRN
  Administered 2020-02-03: 650 mg via ORAL
  Filled 2020-02-03: qty 2

## 2020-02-03 MED ORDER — VITAMIN E 180 MG (400 UNIT) PO CAPS
800.0000 [IU] | ORAL_CAPSULE | Freq: Every day | ORAL | Status: DC
  Administered 2020-02-04: 10:00:00 800 [IU] via ORAL
  Filled 2020-02-03 (×2): qty 2

## 2020-02-03 MED ORDER — ATORVASTATIN CALCIUM 10 MG PO TABS: 20.0000 mg | ORAL_TABLET | Freq: Every day | ORAL | Status: DC

## 2020-02-03 MED ORDER — IPRATROPIUM BROMIDE 0.02 % IN SOLN
0.5000 mg | Freq: Three times a day (TID) | RESPIRATORY_TRACT | Status: DC
  Filled 2020-02-03: qty 2.5

## 2020-02-03 MED ORDER — PANTOPRAZOLE SODIUM 40 MG PO TBEC: 80.0000 mg | DELAYED_RELEASE_TABLET | Freq: Every day | ORAL | Status: DC

## 2020-02-03 MED ORDER — METHIMAZOLE 10 MG PO TABS
30.0000 mg | ORAL_TABLET | Freq: Every day | ORAL | Status: DC
  Administered 2020-02-04: 30 mg via ORAL
  Filled 2020-02-03: qty 3

## 2020-02-03 MED ORDER — OXYBUTYNIN CHLORIDE ER 10 MG PO TB24
10.0000 mg | ORAL_TABLET | Freq: Every day | ORAL | Status: DC
  Administered 2020-02-04: 10 mg via ORAL
  Filled 2020-02-03: qty 1

## 2020-02-03 MED ORDER — ISOSORBIDE MONONITRATE ER 60 MG PO TB24
120.0000 mg | ORAL_TABLET | Freq: Every day | ORAL | Status: DC
  Administered 2020-02-04: 120 mg via ORAL
  Filled 2020-02-03: qty 2

## 2020-02-03 MED ORDER — ASCORBIC ACID 500 MG PO TABS
500.0000 mg | ORAL_TABLET | Freq: Two times a day (BID) | ORAL | Status: DC
  Administered 2020-02-03 – 2020-02-04 (×2): 500 mg via ORAL
  Filled 2020-02-03 (×2): qty 1

## 2020-02-03 MED ORDER — OXYCODONE-ACETAMINOPHEN 10-325 MG PO TABS: 1.0000 | ORAL_TABLET | Freq: Two times a day (BID) | ORAL | Status: DC | PRN

## 2020-02-03 MED ORDER — OXYCODONE HCL 5 MG PO TABS
5.0000 mg | ORAL_TABLET | Freq: Two times a day (BID) | ORAL | Status: DC | PRN
  Administered 2020-02-04: 10:00:00 5 mg via ORAL
  Filled 2020-02-03: qty 1

## 2020-02-03 NOTE — Discharge Summary (Signed)
History and Physical    [name] TFT:[id number] DOB: 1945-11-10 DOA: 02/03/2020  I have briefly reviewed the patient's prior medical records in [facility] Link  PCP: Shelva Majestic, MD  Patient coming from: Home  Chief Complaint: Cramping and chest discomfort  HPI: [name] is a 74 y.o. female with medical history of coronary artery disease with last cath in 2018 with mild nonobstructive coronary artery disease.  Patient follows with Dr. Katrinka Blazing of cardiology.  Was last seen by cardiology in early March with plans to follow-up in a month.  It appears patient has chest pain frequently and they have been titrating her medicines as an outpatient (including Imdur to 120 and decreasing of Toprol to 25) patient also has a history of hyperthyroidism and follows with Dr. Everardo All. Patient presented to the ER with leg cramping and chest pain that began earlier this morning.  Patient stated she took some nitroglycerin at home and did not have full relief.  Patient denies any associated shortness of breath and denies any calf tenderness, patient also denies the pain is related to eating or worse with deep breathing.  Patient states she has been taking all of her medicines but she thinks she has been forgetting to take her potassium.  Of note patient had a COVID-19 infection in October 2020   Review of Systems: As per HPI otherwise 10 point review of systems negative.   Past Medical History:  Diagnosis Date  . Anemia 12/22/2017  . Arthritis   . Chronic lower back pain   . Cluster headache   . Coronary artery disease    a. 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 non obs dz LHC 2007. c. Non obs dz (mild-mod) by [facility] 05/17/14 d. cath 05/26/17 mild non obstructive CAD  . COVID-19 08/07/2019  . Diverticulitis    a. Hx microperf 2012 - hospitalizated.  Marland Kitchen GERD (gastroesophageal reflux disease)   . Hypercholesteremia   . Hypertension   . Overactive bladder 09/19/2014   Oxybutynin 5mg  XL--> 10mg   . PVC's  (premature ventricular contractions)    a. Hx of trigeminy/bigeminy.  . Seizures (HCC)   . Thyroid disease    hyperthyroid  . UTI (lower urinary tract infection)     Past Surgical History:  Procedure Laterality Date  . ABDOMINAL HYSTERECTOMY  1987   "partial"-still has ovaries  . CARDIAC CATHETERIZATION  05/17/2014  . CORONARY ANGIOPLASTY WITH STENT PLACEMENT  1995   "1"  . JOINT REPLACEMENT     right knee  . LEFT HEART CATH AND CORONARY ANGIOGRAPHY N/A 05/26/2017   Procedure: Left Heart Cath and Coronary Angiography;  Surgeon: 05/19/2014, MD;  Location: [facility] INVASIVE CV LAB;  Service: Cardiovascular;  Laterality: N/A;  . LEFT HEART CATHETERIZATION WITH CORONARY ANGIOGRAM N/A 05/17/2014   Procedure: LEFT HEART CATHETERIZATION WITH CORONARY ANGIOGRAM;  Surgeon: [facility], MD;  Location: [facility] CATH LAB;  Service: Cardiovascular;  Laterality: N/A;  . TOTAL KNEE ARTHROPLASTY Right 2005  . TUBAL LIGATION  1970  . VASCULAR SURGERY       reports that she has never smoked. She has never used smokeless tobacco. She reports that she does not drink alcohol or use drugs.  Allergies  Allergen Reactions  . Sulfa Drugs Cross Reactors Shortness Of Breath and Palpitations    Family History  Problem Relation Age of Onset  . CAD Brother   . Diabetes Brother   . CAD Father   . Diabetes Father   . Diabetes Mother  father, sister, brothers  . Diabetes Sister   . Thyroid disease Sister   . Heart attack Brother   . Prostate cancer Brother   . Thyroid disease Sister   . Diabetes Sister     Prior to Admission medications   Medication Sig Start Date End Date Taking? Authorizing Provider  amLODipine (NORVASC) 10 MG tablet Take 5 mg by mouth daily.   Yes [provider]  ASPIRIN LOW DOSE PO Take 81 mg by mouth daily.   Yes [provider]  atorvastatin (LIPITOR) 20 MG tablet Take 1 tablet (20 mg total) by mouth daily at 6 PM. 10/13/19  Yes Shelva Majestic, MD    cholecalciferol (VITAMIN D) 400 UNITS TABS tablet Take 400 Units daily by mouth.    Yes [provider]  esomeprazole (NEXIUM) 40 MG capsule TAKE 1 CAPSULE(40 MG) BY MOUTH DAILY Patient taking differently: Take 40 mg by mouth daily.  09/08/19  Yes Shelva Majestic, MD  ezetimibe (ZETIA) 10 MG tablet TAKE 1 TABLET(10 MG) BY MOUTH DAILY Patient taking differently: Take 10 mg by mouth daily.  09/08/19  Yes Shelva Majestic, MD  Ferrous Sulfate Dried (EQ SLOW-RELEASE IRON) 45 MG TBCR Take 1 tablet by mouth daily. 01/21/18  Yes Jarold Motto, PA  furosemide (LASIX) 20 MG tablet Take 1 tablet (20 mg total) by mouth daily. 10/16/19  Yes Berton Bon, NP  ipratropium (ATROVENT HFA) 17 MCG/ACT inhaler Inhale 2 puffs into the lungs every 8 (eight) hours. 08/13/19  Yes Shahmehdi, Seyed A, MD  isosorbide mononitrate (IMDUR) 120 MG 24 hr tablet Take 1 tablet (120 mg total) by mouth daily. 12/28/19  Yes Georgie Chard D, NP  levalbuterol [facility] HFA) 45 MCG/ACT inhaler Inhale 2 puffs into the lungs every 8 (eight) hours. 08/13/19  Yes Shahmehdi, Seyed A, MD  losartan (COZAAR) 25 MG tablet Take 25 mg by mouth daily.   Yes [provider]  methimazole (TAPAZOLE) 10 MG tablet Take 3 tablets (30 mg total) by mouth daily. 01/30/20  Yes Romero Belling, MD  metoprolol succinate (TOPROL-XL) 25 MG 24 hr tablet Take 1 tablet (25 mg total) by mouth daily. 12/28/19  Yes Georgie Chard D, NP  nitroGLYCERIN (NITROSTAT) 0.4 MG SL tablet PLACE 1 TABLET UNDER THE TONGUE EVERY 5 MINUTES FOR CHEST PAIN FOR 3 DOSES Patient taking differently: Place 0.4 mg under the tongue every 5 (five) minutes as needed for chest pain.  08/21/19  Yes Shelva Majestic, MD  oxybutynin (DITROPAN-XL) 10 MG 24 hr tablet TAKE 1 TABLET BY MOUTH ONCE DAILY 12/29/18  Yes Shelva Majestic, MD  oxyCODONE-acetaminophen (PERCOCET) 10-325 MG tablet Take 1 tablet by mouth 2 (two) times daily as needed. 08/25/19  Yes [provider]   Prenatal Vit-Fe Fumarate-FA (PRENATAL MULTIVITAMIN) TABS tablet Take 1 tablet by mouth daily at 12 noon.   Yes [provider]  venlafaxine XR (EFFEXOR-XR) 150 MG 24 hr capsule TAKE 1 CAPSULE BY MOUTH EVERY NIGHT AT BEDTIME Patient taking differently: Take 150 mg by mouth daily.  01/09/20  Yes Shelva Majestic, MD  vitamin C (VITAMIN C) 500 MG tablet Take 1 tablet (500 mg total) by mouth 2 (two) times daily. 08/08/19  Yes Burnadette Pop, MD  Vitamin E 180 MG CAPS Take 2 capsules by mouth daily.   Yes [provider]  zinc sulfate 220 (50 Zn) MG capsule Take 1 capsule (220 mg total) by mouth daily. 08/09/19  Yes Burnadette Pop, MD  loperamide (IMODIUM)  2 MG capsule Take 1 capsule (2 mg total) by mouth as needed for diarrhea or loose stools. Patient not taking: Reported on 02/03/2020 10/04/17   Regalado, Jerald Kief A, MD  potassium chloride (MICRO-K) 10 MEQ CR capsule TAKE 1 CAPSULE BY MOUTH TWICE DAILY Patient not taking: Reported on 02/03/2020 06/26/19   Marin Olp, MD  rosuvastatin (CRESTOR) 20 MG tablet Take 1 tablet (20 mg total) by mouth daily. Patient not taking: Reported on 02/03/2020 01/16/20   Marin Olp, MD    Physical Exam: Vitals:   02/03/20 1300 02/03/20 1400 02/03/20 1500 02/03/20 1600  BP: 114/71 128/79 114/67 (!) 118/106  Pulse: 77 72 63 66  Resp: 18 19 20 20   Temp:      TempSrc:      SpO2: 98% 99% 100% 99%  Weight:      Height:          Constitutional: NAD, calm, comfortable Eyes: PERRL, lids and conjunctivae normal ENMT: Mucous membranes are moist. Posterior pharynx clear of any exudate or lesions.Normal dentition.  Neck: normal, supple, no masses, no thyromegaly Respiratory: clear to auscultation bilaterally, no wheezing, no crackles. Normal respiratory effort. No accessory muscle use.  Cardiovascular: Regular rate and rhythm, no murmurs / rubs / gallops. No extremity edema. 2+ pedal pulses.  Abdomen: no tenderness, no masses palpated.  Bowel sounds positive.  Musculoskeletal: no clubbing / cyanosis. Normal muscle tone.  Skin: no rashes, lesions, ulcers. No induration Neurologic: CN 2-12 grossly intact. Strength 5/5 in all 4.  Psychiatric: Normal judgment and insight. Alert and oriented x 3. Normal mood.   Labs on Admission: I have personally reviewed following labs and imaging studies  CBC: Recent Labs  Lab 02/03/20 1220  WBC 4.5  HGB 13.9  HCT 41.9  MCV 98.1  PLT 008   Basic Metabolic Panel: Recent Labs  Lab 02/03/20 1220  NA 142  K 3.0*  CL 104  CO2 28  GLUCOSE 104*  BUN 9  CREATININE 0.93  CALCIUM 9.4   GFR: Estimated Creatinine Clearance: 50.4 mL/min (by C-G formula based on SCr of 0.93 mg/dL). Liver Function Tests: No results for input(s): AST, ALT, ALKPHOS, BILITOT, PROT, ALBUMIN in the last 168 hours. No results for input(s): LIPASE, AMYLASE in the last 168 hours. No results for input(s): AMMONIA in the last 168 hours. Coagulation Profile: No results for input(s): INR, PROTIME in the last 168 hours. Cardiac Enzymes: No results for input(s): CKTOTAL, CKMB, CKMBINDEX, TROPONINI in the last 168 hours. BNP (last 3 results) Recent Labs    10/12/19 1603  PROBNP 932*   HbA1C: No results for input(s): HGBA1C in the last 72 hours. CBG: No results for input(s): GLUCAP in the last 168 hours. Lipid Profile: No results for input(s): CHOL, HDL, LDLCALC, TRIG, CHOLHDL, LDLDIRECT in the last 72 hours. Thyroid Function Tests: No results for input(s): TSH, T4TOTAL, FREET4, T3FREE, THYROIDAB in the last 72 hours. Anemia Panel: No results for input(s): VITAMINB12, FOLATE, FERRITIN, TIBC, IRON, RETICCTPCT in the last 72 hours. Urine analysis:    Component Value Date/Time   COLORURINE AMBER (A) 08/11/2019 1619   APPEARANCEUR CLOUDY (A) 08/11/2019 1619   LABSPEC 1.024 08/11/2019 1619   PHURINE 5.0 08/11/2019 1619   GLUCOSEU NEGATIVE 08/11/2019 1619   HGBUR NEGATIVE 08/11/2019 1619   BILIRUBINUR  Negative 01/09/2020 1350   KETONESUR NEGATIVE 08/11/2019 1619   PROTEINUR Negative 01/09/2020 1350   PROTEINUR 100 (A) 08/11/2019 1619   UROBILINOGEN 0.2 01/09/2020 1350   UROBILINOGEN  1.0 05/17/2014 0342   NITRITE Negative 01/09/2020 1350   NITRITE NEGATIVE 08/11/2019 1619   LEUKOCYTESUR Negative 01/09/2020 1350   LEUKOCYTESUR NEGATIVE 08/11/2019 1619     Radiological Exams on Admission: DG Chest 2 View  Result Date: 02/03/2020 CLINICAL DATA:  Chest pain EXAM: CHEST - 2 VIEW COMPARISON:  August 07, 2019 FINDINGS: The heart size and mediastinal contours are within normal limits. Both lungs are clear. The visualized skeletal structures are unremarkable. IMPRESSION: No active cardiopulmonary disease. Electronically Signed   By: Gerome Sam III M.D   On: 02/03/2020 12:50    EKG: Independently reviewed.  Appears similar to prior  Assessment/Plan Active Problems:   HTN (hypertension)   GERD (gastroesophageal reflux disease)   Coronary artery disease involving native coronary artery of native heart with angina pectoris (HCC)   Hypokalemia   Chest pain   Chest pain -EKG reassuring -Cardiac enzymes thus far negative -Continue home meds including Imdur/Norvasc/Toprol We will monitor on telemetry overnight and suspect patient can be discharged home with close cardiology follow-up -We will get D-dimer to rule out PE as patient had a COVID-19 diagnosis in October: Although this seems less likely  Hypertension -Continue home meds  Hypokalemia -replete and resume home mentation -Check magnesium  Hyperlipidemia -Well-controlled on home medications LDL was 84 last month  GERD -P.o. replacement   DVT prophylaxis: Lovenox Code Status: Full Family Communication: Patient Disposition Plan: Suspect patient can go home in the a.m. with outpatient follow-up with cardiology Consults called: None    Admission status: Observation  At the point of initial evaluation, it is my  clinical opinion that admission for OBSERVATION is reasonable and necessary because the patient's presenting complaints in the context of their chronic conditions represent sufficient risk of deterioration or significant morbidity to constitute reasonable grounds for close observation in the hospital setting, but that the patient may be medically stable for discharge from the hospital within 24 to 48 hours.     Joseph Art [facility]   How to contact the [facility] Attending or Consulting provider 7A - 7P or covering provider during after hours 7P -7A, for this patient?  1. Check the care team in [facility] and look for a) attending/consulting [facility] provider listed and b) the [facility] team listed 2. Log into [email or web address] and use [facility]'s universal password to access. If you do not have the password, please contact the hospital operator. 3. Locate the [facility] provider you are looking for under [facility] and page to a number that you can be directly reached. 4. If you still have difficulty reaching the provider, please page the [facility] (Director on Call) for the Hospitalists listed on amion for assistance.  02/03/2020, 4:54 PM

## 2020-02-03 NOTE — ED Notes (Signed)
This nurse was discussing potassium and when I asked her when was her last dose, pt just realized that she has not taken in one month.

## 2020-02-03 NOTE — ED Provider Notes (Signed)
[facility] EMERGENCY DEPARTMENT Provider Note   CSN: [id number] Arrival date & time: 02/03/20  1200     History Chief Complaint  Patient presents with  . Chest Pain  . Leg Pain    [name] is a 74 y.o. female.  74 year old female who presents with chest discomfort which began earlier this morning.  Patient states that she has had similar symptoms in the past which she is taken nitroglycerin for.  Did take some today which did help.  States that she has a history of coronary artery disease and is status post stent several years ago.  Is followed by Dr. Katrinka Blazing in cardiology.  Started having some lower extremity cramps and then developed severe diaphoresis along with worsening chest discomfort which radiated to her left arm.  She did not take any nitroglycerin at this time and instead came here.  Denies any recent fever, cough, congestion.  No leg pain or swelling.        Past Medical History:  Diagnosis Date  . Anemia 12/22/2017  . Arthritis   . Chronic lower back pain   . Cluster headache   . Coronary artery disease    a. 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 non obs dz LHC 2007. c. Non obs dz (mild-mod) by [facility] 05/17/14 d. cath 05/26/17 mild non obstructive CAD  . COVID-19 08/07/2019  . Diverticulitis    a. Hx microperf 2012 - hospitalizated.  Marland Kitchen GERD (gastroesophageal reflux disease)   . Hypercholesteremia   . Hypertension   . Overactive bladder 09/19/2014   Oxybutynin 5mg  XL--> 10mg   . PVC's (premature ventricular contractions)    a. Hx of trigeminy/bigeminy.  . Seizures (HCC)   . Thyroid disease    hyperthyroid  . UTI (lower urinary tract infection)     Patient Active Problem List   Diagnosis Date Noted  . Aortic atherosclerosis (HCC) 01/09/2020  . Heart failure with reduced ejection fraction (HCC) 08/21/2019  . COVID-19 08/07/2019  . Anemia 12/22/2017  . Demand ischemia (HCC)   . Hyperthyroidism 05/22/2017  . Overactive bladder 09/19/2014  . Migraine  09/19/2014  . Coronary artery disease involving native coronary artery of native heart with angina pectoris (HCC)   . Seizures (HCC)   . Arthritis   . Hypercholesteremia   . GERD (gastroesophageal reflux disease) 10/04/2011  . HTN (hypertension) 10/01/2011    Past Surgical History:  Procedure Laterality Date  . ABDOMINAL HYSTERECTOMY  1987   "partial"-still has ovaries  . CARDIAC CATHETERIZATION  05/17/2014  . CORONARY ANGIOPLASTY WITH STENT PLACEMENT  1995   "1"  . JOINT REPLACEMENT     right knee  . LEFT HEART CATH AND CORONARY ANGIOGRAPHY N/A 05/26/2017   Procedure: Left Heart Cath and Coronary Angiography;  Surgeon: 05/19/2014, MD;  Location: [facility] INVASIVE CV LAB;  Service: Cardiovascular;  Laterality: N/A;  . LEFT HEART CATHETERIZATION WITH CORONARY ANGIOGRAM N/A 05/17/2014   Procedure: LEFT HEART CATHETERIZATION WITH CORONARY ANGIOGRAM;  Surgeon: [facility], MD;  Location: [facility] CATH LAB;  Service: Cardiovascular;  Laterality: N/A;  . TOTAL KNEE ARTHROPLASTY Right 2005  . TUBAL LIGATION  1970  . VASCULAR SURGERY       OB History   No obstetric history on file.     Family History  Problem Relation Age of Onset  . CAD Brother   . Diabetes Brother   . CAD Father   . Diabetes Father   . Diabetes Mother  father, sister, brothers  . Diabetes Sister   . Thyroid disease Sister     Social History   Tobacco Use  . Smoking status: Never Smoker  . Smokeless tobacco: Never Used  Substance Use Topics  . Alcohol use: No  . Drug use: No    Home Medications Prior to Admission medications   Medication Sig Start Date End Date Taking? Authorizing Provider  amLODipine (NORVASC) 10 MG tablet Take 5 mg by mouth daily.    [provider]  ASPIRIN LOW DOSE PO Take 81 mg by mouth daily.    [provider]  atorvastatin (LIPITOR) 20 MG tablet Take 1 tablet (20 mg total) by mouth daily at 6 PM. 10/13/19   Marin Olp, MD  cholecalciferol  (VITAMIN D) 400 UNITS TABS tablet Take 400 Units daily by mouth.     [provider]  esomeprazole (NEXIUM) 40 MG capsule TAKE 1 CAPSULE(40 MG) BY MOUTH DAILY 09/08/19   Marin Olp, MD  ezetimibe (ZETIA) 10 MG tablet TAKE 1 TABLET(10 MG) BY MOUTH DAILY 09/08/19   Marin Olp, MD  Ferrous Sulfate Dried (EQ SLOW-RELEASE IRON) 45 MG TBCR Take 1 tablet by mouth daily. 01/21/18   Inda Coke, PA  furosemide (LASIX) 20 MG tablet Take 1 tablet (20 mg total) by mouth daily. 10/16/19   Daune Perch, NP  ipratropium (ATROVENT HFA) 17 MCG/ACT inhaler Inhale 2 puffs into the lungs every 8 (eight) hours. 08/13/19   Shahmehdi, Valeria Batman, MD  isosorbide mononitrate (IMDUR) 120 MG 24 hr tablet Take 1 tablet (120 mg total) by mouth daily. 12/28/19   Tommie Raymond, NP  levalbuterol [facility] HFA) 45 MCG/ACT inhaler Inhale 2 puffs into the lungs every 8 (eight) hours. 08/13/19   Shahmehdi, Valeria Batman, MD  loperamide (IMODIUM) 2 MG capsule Take 1 capsule (2 mg total) by mouth as needed for diarrhea or loose stools. 10/04/17   Regalado, Belkys A, MD  losartan (COZAAR) 25 MG tablet Take 25 mg by mouth daily.    [provider]  methimazole (TAPAZOLE) 10 MG tablet Take 3 tablets (30 mg total) by mouth daily. 01/30/20   Renato Shin, MD  metoprolol succinate (TOPROL-XL) 25 MG 24 hr tablet Take 1 tablet (25 mg total) by mouth daily. 12/28/19   Kathyrn Drown D, NP  nitroGLYCERIN (NITROSTAT) 0.4 MG SL tablet PLACE 1 TABLET UNDER THE TONGUE EVERY 5 MINUTES FOR CHEST PAIN FOR 3 DOSES 08/21/19   Marin Olp, MD  oxybutynin (DITROPAN-XL) 10 MG 24 hr tablet TAKE 1 TABLET BY MOUTH ONCE DAILY 12/29/18   Marin Olp, MD  oxyCODONE-acetaminophen (PERCOCET) 10-325 MG tablet Take 1 tablet by mouth 2 (two) times daily as needed. 08/25/19   [provider]  potassium chloride (MICRO-K) 10 MEQ CR capsule TAKE 1 CAPSULE BY MOUTH TWICE DAILY 06/26/19   Marin Olp, MD  Prenatal Vit-Fe  Fumarate-FA (PRENATAL MULTIVITAMIN) TABS tablet Take 1 tablet by mouth daily at 12 noon.    [provider]  rosuvastatin (CRESTOR) 20 MG tablet Take 1 tablet (20 mg total) by mouth daily. 01/16/20   Marin Olp, MD  venlafaxine XR (EFFEXOR-XR) 150 MG 24 hr capsule TAKE 1 CAPSULE BY MOUTH EVERY NIGHT AT BEDTIME 01/09/20   Marin Olp, MD  vitamin C (VITAMIN C) 500 MG tablet Take 1 tablet (500 mg total) by mouth 2 (two) times daily. 08/08/19   Shelly Coss, MD  Vitamin E 180 MG CAPS Take 2  capsules by mouth daily.    [provider]  zinc sulfate 220 (50 Zn) MG capsule Take 1 capsule (220 mg total) by mouth daily. 08/09/19   Burnadette Pop, MD    Allergies    Sulfa drugs cross reactors  Review of Systems   Review of Systems  All other systems reviewed and are negative.   Physical Exam Updated Vital Signs BP (!) 117/92 (BP Location: Right Arm)   Pulse 88   Temp 98.5 F (36.9 C) (Oral)   Resp 18   Ht 1.676 m (5\' 6" )   Wt 69.4 kg   SpO2 98%   BMI 24.69 kg/m   Physical Exam Vitals and nursing note reviewed.  Constitutional:      General: She is not in acute distress.    Appearance: Normal appearance. She is well-developed. She is not toxic-appearing.  HENT:     Head: Normocephalic and atraumatic.  Eyes:     General: Lids are normal.     Conjunctiva/sclera: Conjunctivae normal.     Pupils: Pupils are equal, round, and reactive to light.  Neck:     Thyroid: No thyroid mass.     Trachea: No tracheal deviation.  Cardiovascular:     Rate and Rhythm: Normal rate and regular rhythm.     Heart sounds: Normal heart sounds. No murmur. No gallop.   Pulmonary:     Effort: Pulmonary effort is normal. No respiratory distress.     Breath sounds: Normal breath sounds. No stridor. No decreased breath sounds, wheezing, rhonchi or rales.  Abdominal:     General: Bowel sounds are normal. There is no distension.     Palpations: Abdomen is soft.      Tenderness: There is no abdominal tenderness. There is no rebound.  Musculoskeletal:        General: No tenderness. Normal range of motion.     Cervical back: Normal range of motion and neck supple.  Skin:    General: Skin is warm and dry.     Findings: No abrasion or rash.  Neurological:     Mental Status: She is alert and oriented to person, place, and time.     GCS: GCS eye subscore is 4. GCS verbal subscore is 5. GCS motor subscore is 6.     Cranial Nerves: No cranial nerve deficit.     Sensory: No sensory deficit.  Psychiatric:        Speech: Speech normal.        Behavior: Behavior normal.     ED Results / Procedures / Treatments   Labs (all labs ordered are listed, but only abnormal results are displayed) Labs Reviewed  BASIC METABOLIC PANEL  CBC  TROPONIN I (HIGH SENSITIVITY)    EKG EKG Interpretation  Date/Time:  Saturday February 03 2020 12:03:22 EDT Ventricular Rate:  88 PR Interval:  116 QRS Duration: 94 QT Interval:  386 QTC Calculation: 467 R Axis:   66 Text Interpretation: Normal sinus rhythm Marked ST abnormality, possible inferior subendocardial injury Abnormal ECG No significant change since last tracing Confirmed by 01-16-1982 (Lorre Nick) on 02/03/2020 12:48:49 PM   Radiology DG Chest 2 View  Result Date: 02/03/2020 CLINICAL DATA:  Chest pain EXAM: CHEST - 2 VIEW COMPARISON:  August 07, 2019 FINDINGS: The heart size and mediastinal contours are within normal limits. Both lungs are clear. The visualized skeletal structures are unremarkable. IMPRESSION: No active cardiopulmonary disease. Electronically Signed   By: August 09, 2019 III M.D  On: 02/03/2020 12:50    Procedures Procedures (including critical care time)  Medications Ordered in ED Medications  sodium chloride flush (NS) 0.9 % injection 3 mL (has no administration in time range)    ED Course  I have reviewed the triage vital signs and the nursing notes.  Pertinent labs & imaging  results that were available during my care of the patient were reviewed by me and considered in my medical decision making (see chart for details).    MDM Rules/Calculators/A&P                      Patient mild hypokalemia here treated with oral potassium.  Delta troponin negative.  EKG without ischemic changes.  Review of patient's old records shows that she does have history of coronary vasospasm but did have a heart catheterization which did show CAD.  Patient states that her discomfort today was different when it was associate with diaphoresis.  Will consult hospitalist for admission Final Clinical Impression(s) / ED Diagnoses Final diagnoses:  None    Rx / DC Orders ED Discharge Orders    None       Lorre Nick, MD 02/03/20 1610

## 2020-02-03 NOTE — ED Triage Notes (Signed)
Pt. Stated, my heart has been acting up and Ive taken a NTG and it got better, but Ive had leg cramping and it was bad and I had a big sweat and my chest feeling heavy.

## 2020-02-04 ENCOUNTER — Observation Stay (HOSPITAL_COMMUNITY): Payer: Medicare Other

## 2020-02-04 ENCOUNTER — Encounter (HOSPITAL_COMMUNITY): Payer: Self-pay | Admitting: Internal Medicine

## 2020-02-04 DIAGNOSIS — R079 Chest pain, unspecified: Secondary | ICD-10-CM | POA: Diagnosis not present

## 2020-02-04 LAB — BASIC METABOLIC PANEL
Anion gap: 9 (ref 5–15)
BUN: 15 mg/dL (ref 8–23)
CO2: 28 mmol/L (ref 22–32)
Calcium: 9.2 mg/dL (ref 8.9–10.3)
Chloride: 106 mmol/L (ref 98–111)
Creatinine, Ser: 1.12 mg/dL — ABNORMAL HIGH (ref 0.44–1.00)
GFR calc Af Amer: 56 mL/min — ABNORMAL LOW (ref >60)
GFR calc non Af Amer: 49 mL/min — ABNORMAL LOW (ref >60)
Glucose, Bld: 82 mg/dL (ref 70–99)
Potassium: 3.6 mmol/L (ref 3.5–5.1)
Sodium: 143 mmol/L (ref 135–145)

## 2020-02-04 LAB — CBC
HCT: 37.8 % (ref 36.0–46.0)
Hemoglobin: 12.1 g/dL (ref 12.0–15.0)
MCH: 31.6 pg (ref 26.0–34.0)
MCHC: 32 g/dL (ref 30.0–36.0)
MCV: 98.7 fL (ref 80.0–100.0)
Platelets: 230 10*3/uL (ref 150–400)
RBC: 3.83 MIL/uL — ABNORMAL LOW (ref 3.87–5.11)
RDW: 13.5 % (ref 11.5–15.5)
WBC: 5 10*3/uL (ref 4.0–10.5)
nRBC: 0 % (ref 0.0–0.2)

## 2020-02-04 MED ORDER — IPRATROPIUM BROMIDE 0.02 % IN SOLN: 0.5000 mg | Freq: Four times a day (QID) | RESPIRATORY_TRACT | Status: DC | PRN

## 2020-02-04 MED ORDER — IOHEXOL 350 MG/ML SOLN
50.0000 mL | Freq: Once | INTRAVENOUS | Status: AC | PRN
  Administered 2020-02-04: 50 mL via INTRAVENOUS

## 2020-02-04 MED ORDER — IPRATROPIUM BROMIDE 0.02 % IN SOLN
0.5000 mg | Freq: Three times a day (TID) | RESPIRATORY_TRACT | Status: DC
  Administered 2020-02-04: 0.5 mg via RESPIRATORY_TRACT
  Filled 2020-02-04: qty 2.5

## 2020-02-04 NOTE — Plan of Care (Signed)
  Problem: Education: Goal: Knowledge of General Education information will improve Description: Including pain rating scale, medication(s)/side effects and non-pharmacologic comfort measures Outcome: Completed/Met   Problem: Health Behavior/Discharge Planning: Goal: Ability to manage health-related needs will improve Outcome: Completed/Met   Problem: Clinical Measurements: Goal: Ability to maintain clinical measurements within normal limits will improve Outcome: Completed/Met Goal: Will remain free from infection Outcome: Completed/Met Goal: Diagnostic test results will improve Outcome: Completed/Met Goal: Respiratory complications will improve Outcome: Completed/Met Goal: Cardiovascular complication will be avoided Outcome: Completed/Met   Problem: Nutrition: Goal: Adequate nutrition will be maintained Outcome: Completed/Met   Problem: Coping: Goal: Level of anxiety will decrease Outcome: Completed/Met   Problem: Elimination: Goal: Will not experience complications related to bowel motility Outcome: Completed/Met Goal: Will not experience complications related to urinary retention Outcome: Completed/Met   Problem: Pain Managment: Goal: General experience of comfort will improve Outcome: Completed/Met   Problem: Safety: Goal: Ability to remain free from injury will improve Outcome: Completed/Met   Problem: Skin Integrity: Goal: Risk for impaired skin integrity will decrease Outcome: Completed/Met

## 2020-02-04 NOTE — Discharge Summary (Signed)
Physician Discharge Summary  [name] ERD:[id number] DOB: [birth year] DOA: 02/03/2020  PCP: Shelva Majestic, MD  Admit date: 02/03/2020 Discharge date: 02/04/2020  Time spent: 35 minutes  Recommendations for Outpatient Follow-up:  1. Needs outpatient screening for lung nodule in about 6 months-last CT scan prior to this admission was in 2006 2. Needs CBC and differential 1 to 2 weeks 3. Possibly screen for anxiety with GAD-7, depression PHQ-9 as outpatient given multiple significant life changes recently and may need to change regimen that she is currently on  Discharge Diagnoses:  Active Problems:   HTN (hypertension)   GERD (gastroesophageal reflux disease)   Coronary artery disease involving native coronary artery of native heart with angina pectoris (HCC)   Hypokalemia   Chest pain   Discharge Condition: Improved  Diet recommendation: Heart healthy  Filed Weights   02/03/20 1208 02/03/20 1839 02/04/20 0504  Weight: 69.4 kg 68.5 kg 68.5 kg    History of present illness:  74 year old African-American female known history of CAD (an MI 2003 cath 2000 720% nonflow limiting coronary disease) trigeminy/bigeminy, HLD, reflux, CHF followed by Dr. Katrinka Blazing cardiology Admitted 08/08/2019, 08/13/2019-second admission CP + vasospasm with peaking of troponins EF 35-40% She seems quite anxious-tells me that never smoker never drinker Her symptoms came on yesterday in setting of chest pain she took 2 nitro fell asleep and then started having "pulling sensation right lower extremity all the way up her flank" Today she feels fine she has no chest pain no fever no chills no nausea no vomiting Her dimer however is >0.53 and CT chest was negative for pulmonary embolism although she does have a lung nodule Lung nodule will need to be followed up in the outpatient setting  Discharge Exam: Vitals:   02/04/20 0504 02/04/20 0742  BP: 115/88   Pulse: 68 77  Resp: 15 18  Temp: 100 F (37.8 C)    SpO2: 100% 99%    General: Awake alert coherent no distress EOMI NCAT no focal deficit Cardiovascular:  S1-S2 no murmur rub or gallop no icterus no pallor clinically  Respiratory: clear no added sound Abdomen soft no rebound no guarding Neurologically intact no focal deficit  Discharge Instructions   Discharge Instructions    Diet - low sodium heart healthy   Complete by: As directed    Discharge instructions   Complete by: As directed    You do not have any blood clots in your lungs or any cardiac issues  I would recommend you continue your medications without fail and follow-up with Dr. Katrinka Blazing in about a month's time I would also recommend that you seek assistance with your primary care physician and consider consider something to help you with anxiety if you agree that this may be something that is worth pursuing Good luck and have a good summer   Increase activity slowly   Complete by: As directed      Allergies as of 02/04/2020      Reactions   Sulfa Drugs Cross Reactors Shortness Of Breath, Palpitations      Medication List    STOP taking these medications   rosuvastatin 20 MG tablet Commonly known as: Crestor     TAKE these medications   amLODipine 10 MG tablet Commonly known as: NORVASC Take 5 mg by mouth daily.   ascorbic acid 500 MG tablet Commonly known as: VITAMIN C Take 1 tablet (500 mg total) by mouth 2 (two) times daily.   ASPIRIN LOW DOSE PO Take  81 mg by mouth daily.   atorvastatin 20 MG tablet Commonly known as: LIPITOR Take 1 tablet (20 mg total) by mouth daily at 6 PM.   cholecalciferol 10 MCG (400 UNIT) Tabs tablet Commonly known as: VITAMIN D3 Take 400 Units daily by mouth.   esomeprazole 40 MG capsule Commonly known as: NEXIUM TAKE 1 CAPSULE(40 MG) BY MOUTH DAILY What changed: See the new instructions.   ezetimibe 10 MG tablet Commonly known as: ZETIA TAKE 1 TABLET(10 MG) BY MOUTH DAILY What changed: See the new instructions.    Ferrous Sulfate Dried 45 MG Tbcr Commonly known as: EQ Slow-Release Iron Take 1 tablet by mouth daily.   furosemide 20 MG tablet Commonly known as: LASIX Take 1 tablet (20 mg total) by mouth daily.   ipratropium 17 MCG/ACT inhaler Commonly known as: ATROVENT HFA Inhale 2 puffs into the lungs every 8 (eight) hours.   isosorbide mononitrate 120 MG 24 hr tablet Commonly known as: IMDUR Take 1 tablet (120 mg total) by mouth daily.   levalbuterol 45 MCG/ACT inhaler Commonly known as: XOPENEX HFA Inhale 2 puffs into the lungs every 8 (eight) hours.   loperamide 2 MG capsule Commonly known as: IMODIUM Take 1 capsule (2 mg total) by mouth as needed for diarrhea or loose stools.   losartan 25 MG tablet Commonly known as: COZAAR Take 25 mg by mouth daily.   methimazole 10 MG tablet Commonly known as: TAPAZOLE Take 3 tablets (30 mg total) by mouth daily.   metoprolol succinate 25 MG 24 hr tablet Commonly known as: TOPROL-XL Take 1 tablet (25 mg total) by mouth daily.   nitroGLYCERIN 0.4 MG SL tablet Commonly known as: NITROSTAT PLACE 1 TABLET UNDER THE TONGUE EVERY 5 MINUTES FOR CHEST PAIN FOR 3 DOSES What changed:   how much to take  how to take this  when to take this  reasons to take this  additional instructions   oxybutynin 10 MG 24 hr tablet Commonly known as: DITROPAN-XL TAKE 1 TABLET BY MOUTH ONCE DAILY   oxyCODONE-acetaminophen 10-325 MG tablet Commonly known as: PERCOCET Take 1 tablet by mouth 2 (two) times daily as needed.   potassium chloride 10 MEQ CR capsule Commonly known as: MICRO-K TAKE 1 CAPSULE BY MOUTH TWICE DAILY   prenatal multivitamin Tabs tablet Take 1 tablet by mouth daily at 12 noon.   venlafaxine XR 150 MG 24 hr capsule Commonly known as: EFFEXOR-XR TAKE 1 CAPSULE BY MOUTH EVERY NIGHT AT BEDTIME What changed:   how much to take  how to take this  when to take this  additional instructions   Vitamin E 180 MG Caps Take 2  capsules by mouth daily.   zinc sulfate 220 (50 Zn) MG capsule Take 1 capsule (220 mg total) by mouth daily.      Allergies  Allergen Reactions  . Sulfa Drugs Cross Reactors Shortness Of Breath and Palpitations      The results of significant diagnostics from this hospitalization (including imaging, microbiology, ancillary and laboratory) are listed below for reference.    Significant Diagnostic Studies: DG Chest 2 View  Result Date: 02/03/2020 CLINICAL DATA:  Chest pain EXAM: CHEST - 2 VIEW COMPARISON:  August 07, 2019 FINDINGS: The heart size and mediastinal contours are within normal limits. Both lungs are clear. The visualized skeletal structures are unremarkable. IMPRESSION: No active cardiopulmonary disease. Electronically Signed   By: Gerome Sam III M.D   On: 02/03/2020 12:50   DG Lumbar Spine Complete  Result Date: 01/09/2020 CLINICAL DATA:  Low back pain the past 2 months without sciatica. Possible lumbar compression fracture. EXAM: LUMBAR SPINE - COMPLETE 4+ VIEW COMPARISON:  January 20, 2018 FINDINGS: No lumbar compression fracture. Five non-rib-bearing lumbar vertebral bodies with sacralization of L5. Similar degenerative changes compared to March 2019. Moderate disc space narrowing and advanced facet arthropathy at L4-5. Mild disc space narrowing at L3-4 with moderate facet arthropathy. Osseous narrowing of the neural foramina at these levels which may be clinically significant. Minimal disc space narrowing at L2-3. Tiny degenerative endplate changes from T12 to L2. Diffuse bone demineralization. No spondylolysis or spondylolisthesis. Aorto bi-iliac calcified atherosclerosis. Moderate stool burden without bowel obstruction. IMPRESSION: No acute fracture or malalignment of the lumbar spine. Degenerative changes of the lumbar spine, most pronounced at L4-5 and L5-S1, similar to March 2019. Aorto bi-iliac calcified atherosclerosis. Electronically Signed   By: Laurence Ferrari   On:  01/09/2020 23:29   CT ANGIO CHEST PE W OR WO CONTRAST  Result Date: 02/04/2020 CLINICAL DATA:  Chest pain.  Concern for pulmonary embolism. EXAM: CT ANGIOGRAPHY CHEST WITH CONTRAST TECHNIQUE: Multidetector CT imaging of the chest was performed using the standard protocol during bolus administration of intravenous contrast. Multiplanar CT image reconstructions and MIPs were obtained to evaluate the vascular anatomy. CONTRAST:  27mL OMNIPAQUE IOHEXOL 350 MG/ML SOLN COMPARISON:  CT chest 11/03/2018 FINDINGS: Cardiovascular: No filling defects within the pulmonary arteries to suggest acute pulmonary embolism. Coronary artery calcification and aortic atherosclerotic calcification. Mediastinum/Nodes: No axillary or supraclavicular adenopathy. No mediastinal or hilar adenopathy. No pericardial effusion. Lungs/Pleura: 4 mm nodule along the RIGHT horizontal fissure (image 69/6) is not changed. Upper Abdomen: Limited view of the liver, kidneys, pancreas are unremarkable. Normal adrenal glands. Musculoskeletal: Hemangioma within the T3 vertebral body. Sclerosis and Schmorl's nodes in the mid thoracic spine. No change from comparison exam Review of the MIP images confirms the above findings. IMPRESSION: 1. No acute pulmonary embolism. 2. Stable RIGHT middle lobe pulmonary nodule. 3. No acute pulmonary parenchymal findings. 4. Coronary artery calcification and Aortic Atherosclerosis (ICD10-I70.0). Electronically Signed   By: Genevive Bi M.D.   On: 02/04/2020 10:21    Microbiology: Recent Results (from the past 240 hour(s))  SARS CORONAVIRUS 2 (TAT 6-24 HRS) Nasopharyngeal Nasopharyngeal Swab     Status: None   Collection Time: 02/03/20  5:15 PM   Specimen: Nasopharyngeal Swab  Result Value Ref Range Status   SARS Coronavirus 2 NEGATIVE NEGATIVE Final    Comment: (NOTE) SARS-CoV-2 target nucleic acids are NOT DETECTED. The SARS-CoV-2 RNA is generally detectable in upper and lower respiratory specimens during  the acute phase of infection. Negative results do not preclude SARS-CoV-2 infection, do not rule out co-infections with other pathogens, and should not be used as the sole basis for treatment or other patient management decisions. Negative results must be combined with clinical observations, patient history, and epidemiological information. The expected result is Negative. Fact Sheet for Patients: HairSlick.no Fact Sheet for Healthcare Providers: quierodirigir.com This test is not yet approved or cleared by the [place] FDA and  has been authorized for detection and/or diagnosis of SARS-CoV-2 by FDA under an Emergency Use Authorization (EUA). This EUA will remain  in effect (meaning this test can be used) for the duration of the COVID-19 declaration under Section 56 4(b)(1) of the Act, 21 U.S.C. section 360bbb-3(b)(1), unless the authorization is terminated or revoked sooner. Performed at [facility], [address], [place], [place] [address]  Labs: Basic Metabolic Panel: Recent Labs  Lab 02/03/20 1220 02/03/20 1429 02/04/20 0257  NA 142  --  143  K 3.0*  --  3.6  CL 104  --  106  CO2 28  --  28  GLUCOSE 104*  --  82  BUN 9  --  15  CREATININE 0.93  --  1.12*  CALCIUM 9.4  --  9.2  MG  --  1.8  --    Liver Function Tests: No results for input(s): AST, ALT, ALKPHOS, BILITOT, PROT, ALBUMIN in the last 168 hours. No results for input(s): LIPASE, AMYLASE in the last 168 hours. No results for input(s): AMMONIA in the last 168 hours. CBC: Recent Labs  Lab 02/03/20 1220 02/04/20 0257  WBC 4.5 5.0  HGB 13.9 12.1  HCT 41.9 37.8  MCV 98.1 98.7  PLT 261 230   Cardiac Enzymes: No results for input(s): CKTOTAL, CKMB, CKMBINDEX, TROPONINI in the last 168 hours. BNP: BNP (last 3 results) Recent Labs    08/07/19 0529 08/08/19 0442 08/09/19 0723  BNP 203.3* 294.3* 386.1*    ProBNP (last 3  results) Recent Labs    10/12/19 1603  PROBNP 932*    CBG: No results for input(s): GLUCAP in the last 168 hours.     Signed:  Nita Sells MD   [facility] 02/04/2020, 10:31 AM

## 2020-02-04 NOTE — Plan of Care (Signed)
  Problem: Education: Goal: Knowledge of General Education information will improve Description: Including pain rating scale, medication(s)/side effects and non-pharmacologic comfort measures Outcome: Progressing   Problem: Clinical Measurements: Goal: Will remain free from infection Outcome: Progressing   Problem: Activity: Goal: Risk for activity intolerance will decrease Outcome: Progressing   

## 2020-02-06 ENCOUNTER — Telehealth: Payer: Self-pay

## 2020-02-06 NOTE — Telephone Encounter (Signed)
Transition Care Management Follow-up Telephone Call  Date of discharge and from where: 02/04/20 [facility]   How have you been since you were released from the hospital? Stable   Any questions or concerns? No   Items Reviewed:  Did the pt receive and understand the discharge instructions provided? Yes   Medications obtained and verified? Yes   Any new allergies since your discharge? No   Dietary orders reviewed? Yes  Do you have support at home? Yes   Functional Questionnaire: (I = Independent and D = Dependent) ADLs: I  Bathing/Dressing- I  Meal Prep- I  Eating- I  Maintaining continence- I  Transferring/Ambulation- I  Managing Meds- I  Follow up appointments reviewed:   PCP Hospital f/u appt confirmed? Yes  Scheduled to see Dr. Durene Cal on 02/14/20 @ 1040.  Specialist Hospital f/u appt confirmed? Yes  Scheduled to see Cardiology on 02/09/20 @ 1040.  Are transportation arrangements needed? No   If their condition worsens, is the pt aware to call PCP or go to the Emergency Dept.? Yes  Was the patient provided with contact information for the PCP's office or ED? Yes  Was to pt encouraged to call back with questions or concerns? Yes

## 2020-02-06 NOTE — Telephone Encounter (Signed)
Noted thanks  Team- please make sure TCM prominently marked on schedule

## 2020-02-08 NOTE — Progress Notes (Signed)
Cardiology Office Note:    Date:  02/08/2020   ID:  [name], DOB [birth year], MRN [id number]  PCP:  Marin Olp, MD  Cardiologist:  Sinclair Grooms, MD   Referring MD: Marin Olp, MD   No chief complaint on file.   History of Present Illness:    [name] is a 74 y.o. female with a hx of non-obstructive CAD on cardiac cath in 2018, coronary spasmnoted on cardiac cath in 4098,[id number] combine systolic and diastolic CHFef 29-56% resolved to 60-65% in 2021,hypertension, hyperlipidemia, Grave's disease with multinodular goiter (followed by Dr. Loanne Drilling), GERD, and chronic lower back pain.   Admitted 07/2019 with multiple complaints of chest and back pain along with headache and decreased appetite. Patient reported not taking home diuretics and initial BNP was elevated at 2030. Echo showed LVEF 35-40% with mild LVH, grade 2 diastolic dysfunction and elevated mean left atrial pressure as well as severely dilated left atrium and moderate MR. She ended up testing positive for COVID-19 which was felt to be the cause of all of her symptoms.   Recently in the hospital with cramping, flank pain, migratory chest discomfort.  Potassium was 3.0.  Denies palpitations.  Nitroglycerin relieves any discomfort that she has.  Has a history of coronary artery spasm and nonobstructive coronary disease.  Past Medical History:  Diagnosis Date  . Anemia 12/22/2017  . Arthritis   . Chronic lower back pain   . Cluster headache   . Coronary artery disease    a. 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 non obs dz LHC 2007. c. Non obs dz (mild-mod) by [facility] 05/17/14 d. cath 05/26/17 mild non obstructive CAD  . COVID-19 08/07/2019  . Diverticulitis    a. Hx microperf 2012 - hospitalizated.  Marland Kitchen GERD (gastroesophageal reflux disease)   . Hypercholesteremia   . Hypertension   . Overactive bladder 09/19/2014   Oxybutynin 5mg  XL--> 10mg   . PVC's (premature ventricular contractions)    a. Hx of trigeminy/bigeminy.    . Seizures ([place])   . Thyroid disease    hyperthyroid  . UTI (lower urinary tract infection)     Past Surgical History:  Procedure Laterality Date  . ABDOMINAL HYSTERECTOMY  1987   "partial"-still has ovaries  . CARDIAC CATHETERIZATION  05/17/2014  . CORONARY ANGIOPLASTY WITH STENT PLACEMENT  1995   "1"  . JOINT REPLACEMENT     right knee  . LEFT HEART CATH AND CORONARY ANGIOGRAPHY N/A 05/26/2017   Procedure: Left Heart Cath and Coronary Angiography;  Surgeon: Jettie Booze, MD;  Location: [place] [facility];  Service: Cardiovascular;  Laterality: N/A;  . LEFT HEART CATHETERIZATION WITH CORONARY ANGIOGRAM N/A 05/17/2014   Procedure: LEFT HEART CATHETERIZATION WITH CORONARY ANGIOGRAM;  Surgeon: Leonie Man, MD;  Location: [facility] CATH LAB;  Service: Cardiovascular;  Laterality: N/A;  . TOTAL KNEE ARTHROPLASTY Right 2005  . TUBAL LIGATION  1970  . VASCULAR SURGERY      Current Medications: No outpatient medications have been marked as taking for the 02/09/20 encounter (Appointment) with Belva Crome, MD.     Allergies:   Sulfa drugs cross reactors   Social History   Socioeconomic History  . Marital status: Married    Spouse name: Not on file  . Number of children: 4  . Years of education: 4  . Highest education level: Not on file  Occupational History    Comment: retired  Tobacco Use  . Smoking status: Never Smoker  .  Smokeless tobacco: Never Used  Substance and Sexual Activity  . Alcohol use: No  . Drug use: No  . Sexual activity: Never  Other Topics Concern  . Not on file  Social History Narrative   Separated. 4 children from first marriage. 16 grandkids.       Retired from [employer] for 25 years, went to [employer] for 5 years. Retired 2003 after MI      Hobbies: babysit/active with children      Right-handed      Caffeine: 24 oz soda per day      Social Determinants of Health   Financial Resource Strain:   . Difficulty of Paying Living Expenses:    Food Insecurity:   . Worried About Programme researcher, broadcasting/film/video in the Last Year:   . Barista in the Last Year:   Transportation Needs:   . Freight forwarder (Medical):   Marland Kitchen Lack of Transportation (Non-Medical):   Physical Activity:   . Days of Exercise per Week:   . Minutes of Exercise per Session:   Stress:   . Feeling of Stress :   Social Connections:   . Frequency of Communication with Friends and Family:   . Frequency of Social Gatherings with Friends and Family:   . Attends Religious Services:   . Active Member of Clubs or Organizations:   . Attends Banker Meetings:   Marland Kitchen Marital Status:      Family History: The patient's family history includes CAD in her brother and father; Diabetes in her brother, father, mother, sister, and sister; Heart attack in her brother; Prostate cancer in her brother; Thyroid disease in her sister and sister.  ROS:   Please see the history of present illness.    Anxiety.  Poor understanding for medication regimen.  Tends to be compliant with therapy although does not understand what the different agents are used for.  All other systems reviewed and are negative.  EKGs/Labs/Other Studies Reviewed:    The following studies were reviewed today:  ECHOCARDIOGRAM 2021: IMPRESSIONS  1. Left ventricular ejection fraction, by estimation, is 55 to 60%. The  left ventricle has normal function.  2. Mild to moderate mitral valve regurgitation.  3. There is normal pulmonary artery systolic pressure.   EKG:  EKG no new data.  Reviewed the most recent EKGs which were  Recent Labs: 08/09/2019: B Natriuretic Peptide 386.1 10/12/2019: NT-Pro BNP 932 01/09/2020: ALT 17; TSH 2.85 02/03/2020: Magnesium 1.8 02/04/2020: BUN 15; Creatinine, Ser 1.12; Hemoglobin 12.1; Platelets 230; Potassium 3.6; Sodium 143  Recent Lipid Panel    Component Value Date/Time   CHOL 164 01/09/2020 1331   TRIG 110.0 01/09/2020 1331   HDL 58.10 01/09/2020 1331    CHOLHDL 3 01/09/2020 1331   VLDL 22.0 01/09/2020 1331   LDLCALC 84 01/09/2020 1331    Physical Exam:    VS:  There were no vitals taken for this visit.    Wt Readings from Last 3 Encounters:  02/04/20 151 lb 1.6 oz (68.5 kg)  01/30/20 153 lb (69.4 kg)  01/09/20 153 lb (69.4 kg)     GEN: Healthy-appearing. No acute distress HEENT: Normal NECK: No JVD. LYMPHATICS: No lymphadenopathy CARDIAC:  RRR without murmur, gallop, or edema. VASCULAR:  Normal Pulses. No bruits. RESPIRATORY:  Clear to auscultation without rales, wheezing or rhonchi  ABDOMEN: Soft, non-tender, non-distended, No pulsatile mass, MUSCULOSKELETAL: No deformity  SKIN: Warm and dry NEUROLOGIC:  Alert and oriented x 3  PSYCHIATRIC:  Normal affect   ASSESSMENT:    1. Chronic combined systolic and diastolic heart failure (HCC)   2. Coronary artery disease involving native coronary artery of native heart with angina pectoris (HCC)   3. Essential hypertension   4. Hyperlipidemia, unspecified hyperlipidemia type   5. Essential (primary) hypertension   6. Educated about COVID-19 virus infection    PLAN:    In order of problems listed above:  1. No evidence of volume overload.  Continue current therapy.  LV recovery back to normal. 2. Secondary prevention discussed.  The importance of physical activity as discussed.  Currently statin therapy is on hold.  She will need long-term therapy for lipids.  Use sublingual nitroglycerin as needed to control episodes of chest pain. 3. Blood pressure target is less than 130/80.  Continue amlodipine.  Continue Imdur.  Continue Toprol-XL. 4. LDL target should be less than 70.  Resume statin therapy but will allow Dr. Durene Cal to make final decision 5. COVID-19 vaccine has been received.  She has also post Covid infection in October.  Social distancing and masking is continued.  Be suffering some "long haulers" Covid symptoms.  Overall education and awareness concerning  primary/secondary risk prevention was discussed in detail: LDL less than 70, hemoglobin A1c less than 7, blood pressure target less than 130/80 mmHg, >150 minutes of moderate aerobic activity per week, avoidance of smoking, weight control (via diet and exercise), and continued surveillance/management of/for obstructive sleep apnea.    Medication Adjustments/Labs and Tests Ordered: Current medicines are reviewed at length with the patient today.  Concerns regarding medicines are outlined above.  No orders of the defined types were placed in this encounter.  No orders of the defined types were placed in this encounter.   There are no Patient Instructions on file for this visit.   Signed, Lesleigh Noe, MD  02/08/2020 9:39 PM    [facility]

## 2020-02-09 ENCOUNTER — Encounter: Payer: Self-pay | Admitting: Interventional Cardiology

## 2020-02-09 ENCOUNTER — Ambulatory Visit: Payer: Medicare Other | Admitting: Interventional Cardiology

## 2020-02-09 ENCOUNTER — Other Ambulatory Visit: Payer: Self-pay

## 2020-02-09 VITALS — BP 124/68 | HR 67 | Ht 66.0 in | Wt 158.8 lb

## 2020-02-09 DIAGNOSIS — I5042 Chronic combined systolic (congestive) and diastolic (congestive) heart failure: Secondary | ICD-10-CM

## 2020-02-09 DIAGNOSIS — I25119 Atherosclerotic heart disease of native coronary artery with unspecified angina pectoris: Secondary | ICD-10-CM

## 2020-02-09 DIAGNOSIS — E785 Hyperlipidemia, unspecified: Secondary | ICD-10-CM

## 2020-02-09 DIAGNOSIS — Z7189 Other specified counseling: Secondary | ICD-10-CM

## 2020-02-09 DIAGNOSIS — I10 Essential (primary) hypertension: Secondary | ICD-10-CM | POA: Diagnosis not present

## 2020-02-09 NOTE — Patient Instructions (Signed)

## 2020-02-13 NOTE — Progress Notes (Signed)
Phone [phone]   Subjective:  [name] is a 74 y.o. year old very pleasant female patient who presents for transitional care management and hospital follow up for chest pain. Patient was hospitalized from 02/03/2020 to 02/04/2020. A TCM phone call was completed on 02/06/2020. Medical complexity moderate  74 year old female with multiple medical problems presented to the hospital with chest pain.  Patient has had prior admissions in October 2020 comes to with chest pain associated with suspected vasospasm.   She took nitroglycerin before presenting to the hospital.  She did have an elevated D-dimer but likely CT angiogram was negative for pulmonary embolism.  A small lung nodule was noted.  An outpatient primary care follow-up was recommended-I reviewed CT angiogram report as well as prior CT from November 2020-very small nodule at 4 mm with no recommended follow-up per radiology.  Troponins were not elevated this visit-were lower than prior troponins during last hospitalization  She has a history of CHF with reduced ejection fraction as low as 35 to 40% but on most recent echocardiogram December 04, 2019 ejection fraction had improved to 55 to 60%.  She is compliant with Lasix-no shortness of breath or significant weight gain were reported  Patient denies any chest pain since getting out of the hospital.Patient did have follow-up with Dr. Tamala Julian of cardiology 5 days ago on the 16th.  He noted CHF but discussed she remained euvolemic.  For coronary artery disease with vasospasm-patient was continued on i Imdur.   He deferred restarting statin to me-appears this was held while she was hospitalized-unclear why held.  Please note I have reviewed and summarized discharge summary above as well as cardiology office note from 02/09/2020.  Also I have independently reviewed her CT angiogram from 02/04/2020.  No pulmonary embolism was noted.  Patient does have a 4 mm right middle lobe pulmonary nodule which is  stable from prior CT of chest 09/04/2019.  No obvious pneumonia or mass otherwise.  Patient does have coronary artery calcium as well as aortic atherosclerosis noted.  Patient does have some arthritis in the spine.   See problem oriented charting as well  Past Medical History-  Patient Active Problem List   Diagnosis Date Noted  . Heart failure with reduced ejection fraction ([place]) 08/21/2019    Priority: High  . COVID-19 08/07/2019    Priority: High  . Hyperthyroidism 05/22/2017    Priority: High  . Migraine 09/19/2014    Priority: High  . Coronary artery disease involving native coronary artery of native heart with angina pectoris [facility])     Priority: High  . Seizures ([place])     Priority: High  . Demand ischemia ([place])     Priority: Medium  . Overactive bladder 09/19/2014    Priority: Medium  . Hypercholesteremia     Priority: Medium  . GERD (gastroesophageal reflux disease) 10/04/2011    Priority: Medium  . HTN (hypertension) 10/01/2011    Priority: Medium  . Aortic atherosclerosis ([place]) 01/09/2020    Priority: Low  . Hypokalemia 06/01/2014    Priority: Low  . Arthritis     Priority: Low  . Chest pain 02/03/2020  . Anemia 12/22/2017    Medications- reviewed and updated  A medical reconciliation was performed comparing current medicines to hospital discharge medications. Current Outpatient Medications  Medication Sig Dispense Refill  . amLODipine (NORVASC) 10 MG tablet Take 5 mg by mouth daily.    . ASPIRIN LOW DOSE PO Take 81 mg by mouth daily.    Marland Kitchen  Cholecalciferol 25 MCG (1000 UT) tablet Take 1,000 Units by mouth daily.     . Cyanocobalamin (VITAMIN B-12) 3000 MCG SUBL Place 3,000 mg under the tongue daily.    Marland Kitchen esomeprazole (NEXIUM) 40 MG capsule TAKE 1 CAPSULE(40 MG) BY MOUTH DAILY 90 capsule 2  . ezetimibe (ZETIA) 10 MG tablet TAKE 1 TABLET(10 MG) BY MOUTH DAILY 90 tablet 2  . furosemide (LASIX) 20 MG tablet Take 1 tablet (20 mg total) by mouth daily. 90 tablet 3    . isosorbide mononitrate (IMDUR) 120 MG 24 hr tablet Take 1 tablet (120 mg total) by mouth daily. 90 tablet 1  . loperamide (IMODIUM) 2 MG capsule Take 1 capsule (2 mg total) by mouth as needed for diarrhea or loose stools. 30 capsule 0  . methimazole (TAPAZOLE) 10 MG tablet Take 3 tablets (30 mg total) by mouth daily. 360 tablet 1  . metoprolol succinate (TOPROL-XL) 25 MG 24 hr tablet Take 1 tablet (25 mg total) by mouth daily. 90 tablet 1  . nitroGLYCERIN (NITROSTAT) 0.4 MG SL tablet PLACE 1 TABLET UNDER THE TONGUE EVERY 5 MINUTES FOR CHEST PAIN FOR 3 DOSES 25 tablet 0  . oxybutynin (DITROPAN-XL) 10 MG 24 hr tablet TAKE 1 TABLET BY MOUTH ONCE DAILY 90 tablet 3  . oxyCODONE-acetaminophen (PERCOCET) 10-325 MG tablet Take 1 tablet by mouth 2 (two) times daily as needed.    . potassium chloride (MICRO-K) 10 MEQ CR capsule TAKE 1 CAPSULE BY MOUTH TWICE DAILY 180 capsule 1  . Prenatal Vit-Fe Fumarate-FA (PRENATAL MULTIVITAMIN) TABS tablet Take 1 tablet by mouth daily at 12 noon.    . rosuvastatin (CRESTOR) 20 MG tablet Take 1 tablet (20 mg total) by mouth daily. 90 tablet 3  . venlafaxine XR (EFFEXOR-XR) 150 MG 24 hr capsule TAKE 1 CAPSULE BY MOUTH EVERY NIGHT AT BEDTIME 30 capsule 5  . vitamin C (VITAMIN C) 500 MG tablet Take 1 tablet (500 mg total) by mouth 2 (two) times daily. 14 tablet 0  . Vitamin E 180 MG CAPS Take 2 capsules by mouth daily.    Marland Kitchen zinc sulfate 220 (50 Zn) MG capsule Take 1 capsule (220 mg total) by mouth daily. 7 capsule 0   No current facility-administered medications for this visit.   Objective  Objective:  BP 130/72   Pulse 74   Temp 97.6 F (36.4 C)   Ht 5\' 6"  (1.676 m)   Wt 157 lb 9.6 oz (71.5 kg)   SpO2 96%   BMI 25.44 kg/m  Gen: NAD, resting comfortably CV: RRR no murmurs rubs or gallops Lungs: CTAB no crackles, wheeze, rhonchi Abdomen: soft/nontender/nondistended/normal bowel sounds. Ext: no edema Skin: warm, dry Neuro: grossly normal, moves all  extremities    Assessment and Plan:   Coronary artery disease involving native coronary artery of native heart with angina pectoris [facility]) Patient with hospitalization due to chest pain.  Fortunately chest pain has resolved.  CT angiogram without pulmonary embolism.  Troponins were not elevated.  Has already had cardiology follow-up-no further work-up suggested.  Patient is compliant with aspirin and Zetia in addition to Imdur to reduce chest pain-now up to 120 mg.  She also has known hyperlipidemia and statin was stopped while hospitalized for unclear reasons.  We will restart her rosuvastatin 20 mg-increased from prior atorvastatin 20 mg with elevated LDL as well as Zetia-can recheck lipids next visit.  Recommended at least every 3 to 72-month visit  Heart failure with reduced ejection fraction (HCC) Stable/appears  to be euvolemic.  Ejection fraction has improved to 55 to 60%.  Continue metoprolol 25 mg extended release as well as Lasix 20 mg daily.  She should monitor for shortness of breath/weight gain/edema  Hyperthyroidism TSH well-controlled last month.  Patient follows with endocrine.  Continue methimazole  HTN (hypertension) Well-controlled on amlodipine 5 mg and Imdur 120 mg.  Amlodipine is a good choice due to potential vasospasm causing chest pain.  In her case I would actually prefer this over ACE inhibitor or ARB given improvement in EF  Hypercholesteremia Poor control with last LDL over 70.  We changed from atorvastatin 20 mg to rosuvastatin 20 mg.  Unclear why rosuvastatin was held during hospitalization-we will restart at this time. Also continue Zetia  Migraine Patient has been on venlafaxine reportedly for headaches for years.  This has been helpful.  In the hospital hospitalist recommended follow-up PHQ-9 for concerns of possible anxiety or depression.  PHQ-9 is not elevated today at 4.  I asked patient about anxiety and she denies though I think in general she tends to have  high normal anxiety.  We will continue to monitor  Recommended follow up: Recommended 3 to 34-month follow-up verbally Future Appointments  Date Time Provider Department Center  05/01/2020 11:00 AM Romero Belling, MD [id number] None    Lab/Order associations:   ICD-10-CM   1. Essential hypertension  I10 CBC with Differential/Platelet    Comprehensive metabolic panel  2. Coronary artery disease involving native coronary artery of native heart with angina pectoris (HCC)  I25.119   3. Heart failure with reduced ejection fraction (HCC)  I50.20   4. Hyperthyroidism  E05.90   5. Hypercholesteremia  E78.00   6. Migraine with aura and without status migrainosus, not intractable  G43.109     Meds ordered this encounter  Medications  . rosuvastatin (CRESTOR) 20 MG tablet    Sig: Take 1 tablet (20 mg total) by mouth daily.    Dispense:  90 tablet    Refill:  3    Return precautions advised.  Tana Conch, MD

## 2020-02-14 ENCOUNTER — Encounter: Payer: Self-pay | Admitting: Family Medicine

## 2020-02-14 ENCOUNTER — Ambulatory Visit (INDEPENDENT_AMBULATORY_CARE_PROVIDER_SITE_OTHER): Payer: Medicare Other | Admitting: Family Medicine

## 2020-02-14 ENCOUNTER — Other Ambulatory Visit: Payer: Self-pay

## 2020-02-14 VITALS — BP 130/72 | HR 74 | Temp 97.6°F | Ht 66.0 in | Wt 157.6 lb

## 2020-02-14 DIAGNOSIS — I5020 Unspecified systolic (congestive) heart failure: Secondary | ICD-10-CM

## 2020-02-14 DIAGNOSIS — I25119 Atherosclerotic heart disease of native coronary artery with unspecified angina pectoris: Secondary | ICD-10-CM

## 2020-02-14 DIAGNOSIS — I10 Essential (primary) hypertension: Secondary | ICD-10-CM | POA: Diagnosis not present

## 2020-02-14 DIAGNOSIS — E7800 Pure hypercholesterolemia, unspecified: Secondary | ICD-10-CM

## 2020-02-14 DIAGNOSIS — G43109 Migraine with aura, not intractable, without status migrainosus: Secondary | ICD-10-CM

## 2020-02-14 DIAGNOSIS — E0590 Thyrotoxicosis, unspecified without thyrotoxic crisis or storm: Secondary | ICD-10-CM | POA: Diagnosis not present

## 2020-02-14 LAB — COMPREHENSIVE METABOLIC PANEL
ALT: 18 U/L (ref 0–35)
AST: 22 U/L (ref 0–37)
Albumin: 4.3 g/dL (ref 3.5–5.2)
Alkaline Phosphatase: 79 U/L (ref 39–117)
BUN: 15 mg/dL (ref 6–23)
CO2: 31 mEq/L (ref 19–32)
Calcium: 9.5 mg/dL (ref 8.4–10.5)
Chloride: 103 mEq/L (ref 96–112)
Creatinine, Ser: 1 mg/dL (ref 0.40–1.20)
GFR: 65.71 mL/min (ref >60.00)
Glucose, Bld: 96 mg/dL (ref 70–99)
Potassium: 4.3 mEq/L (ref 3.5–5.1)
Sodium: 138 mEq/L (ref 135–145)
Total Bilirubin: 0.6 mg/dL (ref 0.2–1.2)
Total Protein: 7.4 g/dL (ref 6.0–8.3)

## 2020-02-14 MED ORDER — ROSUVASTATIN CALCIUM 20 MG PO TABS: 20.0000 mg | ORAL_TABLET | Freq: Every day | ORAL | 3 refills | Status: DC

## 2020-02-14 NOTE — Assessment & Plan Note (Addendum)
Poor control with last LDL over 70.  We changed from atorvastatin 20 mg to rosuvastatin 20 mg.  Unclear why rosuvastatin was held during hospitalization-we will restart at this time. Also continue Zetia

## 2020-02-14 NOTE — Assessment & Plan Note (Signed)
TSH well-controlled last month.  Patient follows with endocrine.  Continue methimazole

## 2020-02-14 NOTE — Patient Instructions (Addendum)
Health Maintenance Due  Topic Date Due  . COVID-19 Vaccine (1)-havent done yet Never done  . MAMMOGRAM -had to reschedule Never done  . COLONOSCOPY - needs to schedule Never done   Restart rosuvastatin 20 mg each night  Let us know if you have any new or worsening symptoms ASAP- so glad you are better!    Please stop by lab before you go If you do not have mychart- we will call you about results within 5 business days of [place] receiving them.  If you have mychart- we will send your results within 3 business days of [place] receiving them.  If abnormal or we want to clarify a result, we will call or mychart you to make sure you receive the message.  If you have questions or concerns or don't hear within 5 business days, please send [place] a message or call us.

## 2020-02-14 NOTE — Assessment & Plan Note (Signed)
Patient with hospitalization due to chest pain.  Fortunately chest pain has resolved.  CT angiogram without pulmonary embolism.  Troponins were not elevated.  Has already had cardiology follow-up-no further work-up suggested.  Patient is compliant with aspirin and Zetia in addition to Imdur to reduce chest pain-now up to 120 mg.  She also has known hyperlipidemia and statin was stopped while hospitalized for unclear reasons.  We will restart her rosuvastatin 20 mg-increased from prior atorvastatin 20 mg with elevated LDL as well as Zetia-can recheck lipids next visit.  Recommended at least every 3 to 61-month visit

## 2020-02-14 NOTE — Assessment & Plan Note (Signed)
Patient has been on venlafaxine reportedly for headaches for years.  This has been helpful.  In the hospital hospitalist recommended follow-up PHQ-9 for concerns of possible anxiety or depression.  PHQ-9 is not elevated today at 4.  I asked patient about anxiety and she denies though I think in general she tends to have high normal anxiety.  We will continue to monitor

## 2020-02-14 NOTE — Assessment & Plan Note (Signed)
Well-controlled on amlodipine 5 mg and Imdur 120 mg.  Amlodipine is a good choice due to potential vasospasm causing chest pain.  In her case I would actually prefer this over ACE inhibitor or ARB given improvement in EF

## 2020-02-14 NOTE — Assessment & Plan Note (Signed)
Stable/appears to be euvolemic.  Ejection fraction has improved to 55 to 60%.  Continue metoprolol 25 mg extended release as well as Lasix 20 mg daily.  She should monitor for shortness of breath/weight gain/edema

## 2020-02-15 LAB — CBC WITH DIFFERENTIAL/PLATELET
Basophils Absolute: 0 10*3/uL (ref 0.0–0.1)
Basophils Relative: 0.8 % (ref 0.0–3.0)
Eosinophils Absolute: 0.4 10*3/uL (ref 0.0–0.7)
Eosinophils Relative: 7.4 % — ABNORMAL HIGH (ref 0.0–5.0)
HCT: 36.1 % (ref 36.0–46.0)
Hemoglobin: 12.1 g/dL (ref 12.0–15.0)
Lymphocytes Relative: 29.6 % (ref 12.0–46.0)
Lymphs Abs: 1.8 10*3/uL (ref 0.7–4.0)
MCHC: 33.4 g/dL (ref 30.0–36.0)
MCV: 97.8 fl (ref 78.0–100.0)
Monocytes Absolute: 0.5 10*3/uL (ref 0.1–1.0)
Monocytes Relative: 9 % (ref 3.0–12.0)
Neutro Abs: 3.2 10*3/uL (ref 1.4–7.7)
Neutrophils Relative %: 53.2 % (ref 43.0–77.0)
Platelets: 226 10*3/uL (ref 150.0–400.0)
RBC: 3.69 Mil/uL — ABNORMAL LOW (ref 3.87–5.11)
RDW: 13.9 % (ref 11.5–15.5)
WBC: 6 10*3/uL (ref 4.0–10.5)

## 2020-02-20 DIAGNOSIS — I10 Essential (primary) hypertension: Secondary | ICD-10-CM | POA: Diagnosis not present

## 2020-02-22 DIAGNOSIS — Z1231 Encounter for screening mammogram for malignant neoplasm of breast: Secondary | ICD-10-CM | POA: Diagnosis not present

## 2020-02-22 LAB — HM MAMMOGRAPHY

## 2020-02-27 ENCOUNTER — Encounter: Payer: Self-pay | Admitting: Family Medicine

## 2020-03-04 ENCOUNTER — Telehealth: Payer: Self-pay | Admitting: Family Medicine

## 2020-03-04 NOTE — Telephone Encounter (Signed)
Nurse Assessment Nurse: Stefano Gaul, RN, Dwana Curd Date/Time (Eastern Time): 03/04/2020 11:39:38 AM Confirm and document reason for call. If symptomatic, describe symptoms. ---Caller states she had chest pain when she tried to breathe last week. Had chest pain yesterday and again this am. Pain is on the right side. had nitro yesterday and pain got ok. no fever. feels pain more when she breathes. Has the patient had close contact with a person known or suspected to have the novel coronavirus illness OR traveled / lives in area with major community spread (including international travel) in the last 14 days from the onset of symptoms? * If Asymptomatic, screen for exposure and travel within the last 14 days. ---No Does the patient have any new or worsening symptoms? ---Yes Will a triage be completed? ---Yes Related visit to physician within the last 2 weeks? ---No Does the PT have any chronic conditions? (i.e. diabetes, asthma, this includes High risk factors for pregnancy, etc.) ---Yes List chronic conditions. ---heart issue; HTN Is this a behavioral health or substance abuse call? ---No Guidelines Guideline Title Affirmed Question Affirmed Notes Nurse Date/Time (Eastern Time) Chest Pain [1] Chest pain lasts > 5 minutes AND [2] history of heart disease (i.e., angina, heart attack, heart Stefano Gaul, RN, Dwana Curd 03/04/2020 11:43:17 AMPLEASE NOTE: All timestamps contained within this report are represented as [place] Standard Time. CONFIDENTIALTY NOTICE: This fax transmission is intended only for the addressee. It contains information that is legally privileged, confidential or otherwise protected from use or disclosure. If you are not the intended recipient, you are strictly prohibited from reviewing, disclosing, copying using or disseminating any of this information or taking any action in reliance on or regarding this information. If you have received this fax in error, please notify us immediately  by telephone so that we can arrange for its return to [place]. Phone: [phone], Toll-Free: [phone], Fax: [phone] Page: 2 of 2 Call Id: [id number] Guidelines Guideline Title Affirmed Question Affirmed Notes Nurse Date/Time Lamount Cohen Time) failure, bypass surgery, takes nitroglycerin) Disp. Time Lamount Cohen Time) Disposition Final User 03/04/2020 11:38:21 AM Send to Urgent Ladoris Gene 03/04/2020 11:54:19 AM 911 Outcome Documentation Stefano Gaul, RN, Dwana Curd Reason: attempted 911 follow up call and advised pt that there are no appts available today. states she will go to the ER. 03/04/2020 11:50:14 AM Call EMS 911 Now Yes Stefano Gaul, RN, Clerance Lav Disagree/Comply Disagree Caller Understands Yes PreDisposition Call Doctor Care Advice Given Per Guideline CALL EMS 911 NOW: * Immediate medical attention is needed. You need to hang up and call 911 (or an ambulance).

## 2020-03-04 NOTE — Telephone Encounter (Signed)
Spoke with the patient and she stated that she feels fine a present. Her chest pains aren't as bad as they were yesterday (Sunday). Patient verbalized that she knows to go to the ED if chest pains persist or get any worse. Patient has been scheduled to see Dr. Durene Cal tomorrow at 3:40pm to discuss chest pains.

## 2020-03-05 ENCOUNTER — Ambulatory Visit (INDEPENDENT_AMBULATORY_CARE_PROVIDER_SITE_OTHER): Payer: Medicare Other | Admitting: Family Medicine

## 2020-03-05 ENCOUNTER — Other Ambulatory Visit: Payer: Self-pay

## 2020-03-05 ENCOUNTER — Encounter: Payer: Self-pay | Admitting: Family Medicine

## 2020-03-05 VITALS — BP 138/82 | HR 73 | Temp 98.2°F | Ht 66.0 in | Wt 155.0 lb

## 2020-03-05 DIAGNOSIS — I25119 Atherosclerotic heart disease of native coronary artery with unspecified angina pectoris: Secondary | ICD-10-CM | POA: Diagnosis not present

## 2020-03-05 DIAGNOSIS — E7800 Pure hypercholesterolemia, unspecified: Secondary | ICD-10-CM | POA: Diagnosis not present

## 2020-03-05 DIAGNOSIS — I5020 Unspecified systolic (congestive) heart failure: Secondary | ICD-10-CM

## 2020-03-05 DIAGNOSIS — I456 Pre-excitation syndrome: Secondary | ICD-10-CM | POA: Diagnosis not present

## 2020-03-05 DIAGNOSIS — R079 Chest pain, unspecified: Secondary | ICD-10-CM

## 2020-03-05 MED ORDER — ATORVASTATIN CALCIUM 40 MG PO TABS: 40.0000 mg | ORAL_TABLET | Freq: Every day | ORAL | 3 refills | Status: DC

## 2020-03-05 NOTE — Progress Notes (Signed)
Phone [phone] In person visit     I,Donna Orphanos,acting as a scribe for Tana Conch, MD.,have documented all relevant documentation on the behalf of Tana Conch, MD,as directed by  Tana Conch, MD while in the presence of Tana Conch, MD.  Subjective:   [name] is a 74 y.o. year old very pleasant female patient who presents for/with See problem oriented charting Chief Complaint  Patient presents with  . Chest Pain   This visit occurred during the SARS-CoV-2 public health emergency.  Safety protocols were in place, including screening questions prior to the visit, additional usage of staff PPE, and extensive cleaning of exam room while observing appropriate contact time as indicated for disinfecting solutions.   Past Medical History-  Patient Active Problem List   Diagnosis Date Noted  . Heart failure with reduced ejection fraction (HCC) 08/21/2019    Priority: High  . COVID-19 08/07/2019    Priority: High  . Hyperthyroidism 05/22/2017    Priority: High  . Migraine 09/19/2014    Priority: High  . Coronary artery disease involving native coronary artery of native heart with angina pectoris [facility])     Priority: High  . Seizures (HCC)     Priority: High  . Demand ischemia (HCC)     Priority: Medium  . Overactive bladder 09/19/2014    Priority: Medium  . Hypercholesteremia     Priority: Medium  . GERD (gastroesophageal reflux disease) 10/04/2011    Priority: Medium  . HTN (hypertension) 10/01/2011    Priority: Medium  . Aortic atherosclerosis (HCC) 01/09/2020    Priority: Low  . Hypokalemia 06/01/2014    Priority: Low  . Arthritis     Priority: Low  . Chest pain 02/03/2020  . Anemia 12/22/2017    Medications- reviewed and updated Current Outpatient Medications  Medication Sig Dispense Refill  . amLODipine (NORVASC) 10 MG tablet Take 5 mg by mouth daily.    . ASPIRIN LOW DOSE PO Take 81 mg by mouth daily.    . Cholecalciferol 25 MCG (1000 UT)  tablet Take 1,000 Units by mouth daily.     . Cyanocobalamin (VITAMIN B-12) 3000 MCG SUBL Place 3,000 mg under the tongue daily.    Marland Kitchen esomeprazole (NEXIUM) 40 MG capsule TAKE 1 CAPSULE(40 MG) BY MOUTH DAILY 90 capsule 2  . ezetimibe (ZETIA) 10 MG tablet TAKE 1 TABLET(10 MG) BY MOUTH DAILY 90 tablet 2  . furosemide (LASIX) 20 MG tablet Take 1 tablet (20 mg total) by mouth daily. 90 tablet 3  . isosorbide mononitrate (IMDUR) 120 MG 24 hr tablet Take 1 tablet (120 mg total) by mouth daily. 90 tablet 1  . loperamide (IMODIUM) 2 MG capsule Take 1 capsule (2 mg total) by mouth as needed for diarrhea or loose stools. 30 capsule 0  . methimazole (TAPAZOLE) 10 MG tablet Take 3 tablets (30 mg total) by mouth daily. 360 tablet 1  . metoprolol succinate (TOPROL-XL) 25 MG 24 hr tablet Take 1 tablet (25 mg total) by mouth daily. 90 tablet 1  . nitroGLYCERIN (NITROSTAT) 0.4 MG SL tablet PLACE 1 TABLET UNDER THE TONGUE EVERY 5 MINUTES FOR CHEST PAIN FOR 3 DOSES 25 tablet 0  . oxybutynin (DITROPAN-XL) 10 MG 24 hr tablet TAKE 1 TABLET BY MOUTH ONCE DAILY 90 tablet 3  . oxyCODONE-acetaminophen (PERCOCET) 10-325 MG tablet Take 1 tablet by mouth 2 (two) times daily as needed.    . potassium chloride (MICRO-K) 10 MEQ CR capsule TAKE 1 CAPSULE BY MOUTH TWICE  DAILY 180 capsule 1  . Prenatal Vit-Fe Fumarate-FA (PRENATAL MULTIVITAMIN) TABS tablet Take 1 tablet by mouth daily at 12 noon.    . venlafaxine XR (EFFEXOR-XR) 150 MG 24 hr capsule TAKE 1 CAPSULE BY MOUTH EVERY NIGHT AT BEDTIME 30 capsule 5  . vitamin C (VITAMIN C) 500 MG tablet Take 1 tablet (500 mg total) by mouth 2 (two) times daily. 14 tablet 0  . Vitamin E 180 MG CAPS Take 2 capsules by mouth daily.    Marland Kitchen zinc sulfate 220 (50 Zn) MG capsule Take 1 capsule (220 mg total) by mouth daily. 7 capsule 0  . atorvastatin (LIPITOR) 40 MG tablet Take 1 tablet (40 mg total) by mouth daily. 90 tablet 3   No current facility-administered medications for this visit.       Objective:  BP 138/82 (BP Location: Left Arm, Patient Position: Sitting, Cuff Size: Normal)   Pulse 73   Temp 98.2 F (36.8 C) (Temporal)   Ht 5\' 6"  (1.676 m)   Wt 155 lb (70.3 kg)   SpO2 96%   BMI 25.02 kg/m  Gen: NAD, resting comfortably CV: RRR no murmurs rubs or gallops Lungs: CTAB no crackles, wheeze, rhonchi Abdomen: soft/nontender/nondistended Ext: no edema Skin: Warm, dry Psych:anxious appearing, tearful when discussing chest pain from the weekend  EKG: Wolff-Parkinson-White syndrome with rate 69, normal axis, shortened PR interval with normal QRS and QT interval, no hypertrophy, delta wave noted, flattened t wave in III, avl, avf but otherwise no significant st or t wave changes (v5 and v6 with slight depression but less than 1 box)    Assessment and Plan   # Chest Pain S: Pt f/u had chest pains over the weekend- states symptoms were similar to prior heart attack. She felt steady heaviness "like a brick on her chest" that would come in waves off and on all day and associated with sorhortness of breath. All day Sunday it occurred- she thinks in total used 8-9 nitroglycerin and it was effective but it would come back. She didn't want to bother anyone on mother's day but did stay around people in case something happened.  At this point now just dull pain off and on- states only a vague discomfort- she states at current level would not usually even call it pain.   Patient had most recently been seen by me on February 14, 2020 for hospital follow-up for chest pain from February 03, 2020.  CT angiogram at that time was negative for pulmonary embolism.  Troponins were not elevated.  Cardiology saw patient and recommended no further follow-up.  Cardiology did recommend that she continue Imdur at high dose-symptoms thought related to vasospasm.  Patient also on aspirin.  From last catheterization May 26, 2017  "Prox RCA to Mid RCA lesion, 10 %stenosed.  Dist Cx lesion, 20  %stenosed.  Mid LAD lesion, 10 %stenosed.  There is no aortic valve stenosis.  Ost 1st Mrg lesion, 25 %stenosed.  The left ventricular systolic function is normal.  LV end diastolic pressure is normal.  The left ventricular ejection fraction is 55-65% by visual estimate.   No obstructive CAD.  Likely demand ischemia.  Continue preventive therapy. " A/P: 74 year old female with history of nonobstructive coronary artery disease as well as vasospasm that led to NSTEMI in 2003.  She is compliant with isosorbide mononitrate.  She had good relief of symptoms with nitroglycerin but had prolonged and more severe symptoms on Sunday than typical for her. -called to speak  with Dr. Tamala Julian of cardiology for his input-he was concerned about Wolff-Parkinson-White syndrome and possible reentry tachycardia that possibly could be causing recurrent chest pain spells.  Patient has been having recurrent emergency room visits with 2 in the last 8 months plus episode from this weekend which should have led to ER visit -Cardiology was kind enough to work patient into [facility] 15 with [facility] cardiology tomorrow  -Discussed with Dr. Tamala Julian he states patient may need long-term monitoring to look for episodes of tachycardia which may be inducing patient symptoms.  Dr. Tamala Julian was kind enough to directly view the EKG. -Specifically asked if they would like a troponin drawn but Dr. Tamala Julian did not think would be necessary at this point given improvement in her symptoms -Greatly appreciate his time and expertise today  #Heart failure with reduced ejection fraction S: Most recent ejection fraction improved to 55 to 60% in February 2021.  Patient is continued on Lasix 20 mg and metoprolol 25 mg.  No recent weight gain-in fact has had slight weight loss.  Denies increased edema.     A/P: I do not think patient's chest pain is related to heart failure/fluid overload.  Continue current medications.  #hyperlipidemia S: Medication:  she stopped rosuvastatin 20mg  - states had hot flashes and cramps on tis so she stopped. She is on zetia Lab Results  Component Value Date   CHOL 164 01/09/2020   HDL 58.10 01/09/2020   LDLCALC 84 01/09/2020   TRIG 110.0 01/09/2020   CHOLHDL 3 01/09/2020   A/P: Suspect worsening control of rosuvastatin.  Seem to be better with atorvastatin so we will switch back to atorvastatin and increase dose to 40 mg-previously had been on 20 mg.  Recommended follow up: I would like to see patient at least every 6 months but we did not specifically discuss this today Future Appointments  Date Time Provider Carmen  03/06/2020  3:15 PM Leanor Kail, PA [facility]  05/01/2020 11:00 AM Renato Shin, MD [facility] None    Lab/Order associations:   ICD-10-CM   1. Chest pain, unspecified type  R07.9 EKG 12-Lead  2. Hypercholesteremia  E78.00   3. Coronary artery disease involving native coronary artery of native heart with angina pectoris ([place])  I25.119   4. Heart failure with reduced ejection fraction (HCC)  I50.20     Meds ordered this encounter  Medications  . atorvastatin (LIPITOR) 40 MG tablet    Sig: Take 1 tablet (40 mg total) by mouth daily.    Dispense:  90 tablet    Refill:  3    Time Spent: 47 minutes of total time (3:58 PM- 4:31 PM, 4:37-4:43, 7:55- 8:06 PM with 3 minutes of this time being used on EKG interpretation) was spent on the date of the encounter performing the following actions: chart review prior to seeing the patient, phone consult with cardiology obtaining history, performing a medically necessary exam, counseling on the treatment plan, placing orders, and documenting in our EHR.   Return precautions advised.  Garret Reddish, MD

## 2020-03-05 NOTE — Patient Instructions (Addendum)
Start atorvastatin 40mg , sorry the rosuvastatin did not work well for you  I spoke with Dr. Tamala Julian and we were able to get you in for an appointment tomorrow at 315 with cardiology  You have a slightly abnormal rhythm called Wolff-Parkinson-White which can cause rhythm issues-it can also cause a rapid heartbeat which can cause chest pain and shortness of breath potentially.  Cardiology may want to do monitoring to see if anything like this is happening Wolff-Parkinson-White Syndrome  Wolff-Parkinson-White (WPW) syndrome is a heart condition that causes a fast and irregular heartbeat (arrhythmia). The arrhythmia comes and goes suddenly. This condition is congenital. This means that people who have WPW were born with it. However, symptoms may not appear until the teen or adult years. What are the causes? This condition is caused by an extra electrical connection (pathway) between the top chambers of your heart (atria) and the bottom chambers of your heart (ventricles). This can cause an abnormal heart rhythm that comes and goes. What increases the risk? This condition is more likely to develop in people who:  Have a family history of WPW.  Have another congenital heart defect.  Are female.  Are less than 27 years old. What are the signs or symptoms? Symptoms of this condition include:  Feeling your heart "skip" beats (palpitations).  A fast heart rate.  Shortness of breath.  Light-headedness or dizziness.  Fatigue, especially with exercise.  Anxiety.  Chest pain.  Fainting.  Irritability and trouble feeding in young children with the condition.  Stopping of your heart (cardiac arrest). This is rare. Symptoms may start suddenly and last for several minutes or a few hours. They are often triggered by exercise. In some cases, there are no symptoms. How is this diagnosed? This condition is diagnosed based on:  Your medical history.  A physical exam. You may also have tests,  including:  Electrocardiogram (ECG). This test checks the electrical activity of your heart.  Echocardiogram. This test checks your heart motion, valves, and blood flow.  Ambulatory cardiac monitoring. This is a portable ECG that you wear. It checks your heart's rhythm.  Stress testing. This test is an ECG that is done during exercise.  Electrophysiology study. This test measures electrical activity in your heart. It uses a thin tube (catheter) that is inserted in your heart through a blood vessel. How is this treated? Treatment for WPW depends on how often you have symptoms and what type of extra pathway you have. Treatment may include:  Medicine to stop the arrhythmia. You may get this medicine through an IV to stop an attack, or you may take it by mouth to prevent an attack.  Cardioversion. This is a controlled electrical shock. In extreme circumstances, it may be used to return your heart rate to normal.  Radiofrequency ablation. This is a surgical procedure that uses high-frequency radio waves to destroy the extra pathway. In some cases, your health care provider may only watch your condition closely for any changes. Follow these instructions at home:  Take over-the-counter and prescription medicines only as told by your health care provider.  Follow any exercise restrictions as told by your health care provider.  Do not use any products that contain nicotine or tobacco, such as cigarettes and e-cigarettes. If you need help quitting, ask your health care provider.  Do not drink beverages that contain caffeine, such as coffee, soda, and tea.  Do not drink alcohol.  Keep all follow-up visits as told by your health care  provider. This is important. Contact a health care provider if:  You are having symptoms of WPW.  Medicines do not control your symptoms. Get help right away if you:  Have chest pain.  Have difficulty breathing.  Pass out. These symptoms may represent a  serious problem that is an emergency. Do not wait to see if the symptoms will go away. Get medical help right away. Call your local emergency services (911 in the [place] Do not drive yourself to the hospital. Summary  Wolff-Parkinson-White (WPW) syndrome is a heart condition that causes a fast and irregular heartbeat (arrhythmia)that comes and goes suddenly.  People who have this condition were born with it. However, symptoms may not appear until the teen or adult years.  This condition is caused by an extra electrical connection (pathway) between the top chambers of your heart (atria) and the bottom chambers of your heart (ventricles). This can cause an abnormal heart rhythm that comes and goes.  Treatment for WPW depends on how often you have symptoms and what type of extra pathway you have. This information is not intended to replace advice given to you by your health care provider. Make sure you discuss any questions you have with your health care provider. Document Revised: 11/25/2017 Document Reviewed: 11/24/2017 Elsevier Patient Education  2020 [employer].

## 2020-03-06 ENCOUNTER — Ambulatory Visit: Payer: Medicare Other | Admitting: Physician Assistant

## 2020-03-06 ENCOUNTER — Telehealth: Payer: Self-pay | Admitting: *Deleted

## 2020-03-06 ENCOUNTER — Encounter: Payer: Self-pay | Admitting: Physician Assistant

## 2020-03-06 ENCOUNTER — Encounter: Payer: Self-pay | Admitting: *Deleted

## 2020-03-06 VITALS — BP 128/64 | HR 72 | Ht 66.0 in | Wt 156.6 lb

## 2020-03-06 DIAGNOSIS — I25119 Atherosclerotic heart disease of native coronary artery with unspecified angina pectoris: Secondary | ICD-10-CM | POA: Diagnosis not present

## 2020-03-06 DIAGNOSIS — I5032 Chronic diastolic (congestive) heart failure: Secondary | ICD-10-CM

## 2020-03-06 DIAGNOSIS — I10 Essential (primary) hypertension: Secondary | ICD-10-CM | POA: Diagnosis not present

## 2020-03-06 DIAGNOSIS — I456 Pre-excitation syndrome: Secondary | ICD-10-CM

## 2020-03-06 NOTE — Patient Instructions (Signed)
Medication Instructions:  Your physician recommends that you continue on your current medications as directed. Please refer to the Current Medication list given to you today.  *If you need a refill on your cardiac medications before your next appointment, please call your pharmacy*   Lab Work: None ordered  If you have labs (blood work) drawn today and your tests are completely normal, you will receive your results only by: Marland Kitchen MyChart Message (if you have MyChart) OR . A paper copy in the mail If you have any lab test that is abnormal or we need to change your treatment, we will call you to review the results.   Testing/Procedures: Your physician has recommended that you wear an event monitor. Event monitors are medical devices that record the heart's electrical activity. Doctors most often [place] these monitors to diagnose arrhythmias. Arrhythmias are problems with the speed or rhythm of the heartbeat. The monitor is a small, portable device. You can wear one while you do your normal daily activities. This is usually used to diagnose what is causing palpitations/syncope (passing out).   You have been referred to Dr. Ladona Ridgel, Electrophysiologists.  He is located here at the same office as Dr. Katrinka Blazing.    Follow-Up: At [facility], you and your health needs are our priority.  As part of our continuing mission to provide you with exceptional heart care, we have created designated Provider Care Teams.  These Care Teams include your primary Cardiologist (physician) and Advanced Practice Providers (APPs -  Physician Assistants and Nurse Practitioners) who all work together to provide you with the care you need, when you need it.  We recommend signing up for the patient portal called "MyChart".  Sign up information is provided on this After Visit Summary.  MyChart is used to connect with patients for Virtual Visits (Telemedicine).  Patients are able to view lab/test results, encounter notes, upcoming  appointments, etc.  Non-urgent messages can be sent to your provider as well.   To learn more about what you can do with MyChart, go to [email or web address].    Your next appointment:   6 week(s)  The format for your next appointment:   In Person  Provider:   Lewayne Bunting, MD   Other Instructions

## 2020-03-06 NOTE — Progress Notes (Signed)
Cardiology Office Note    Date:  03/06/2020   ID:  [name], DOB [birth year], MRN [id number]  PCP:  Shelva Majestic, MD  Cardiologist: Dr. Katrinka Blazing   Chief Complaint: CP  History of Present Illness:   [name] is a 74 y.o. female with a hx of non-obstructive CAD on cardiac cath in 2018, coronary spasmnoted on cardiac cath in 2003,chronic combine systolic and diastolic CHF with improved LVEF, mitral regurgitations, hypertension, hyperlipidemia, Grave's disease with multinodular goiter (followed by Dr. Everardo All), GERD, and chronic lower back pain presents for CP.  LVEF of 35-40% in setting of +COVID 19 on 07/2019. Last echo 11/2019 showed improved LV function to 55-60% and mild to moderate MR.   Most recently seen by Dr. Katrinka Blazing 02/09/20 for hospital follow up for flank pain, cramping and nitro responsive CP. Continued medical therapy recommended.   Over the weekend patient had episode of chest pain requiring multiple nitroglycerin.  Sometimes a 1-2 at a time.  She never checked her heart heart rate but does not think it was beating fast.  She was nauseated.  No dizziness, syncope, orthopnea, PND or melena.  Patient was seen by Dr. Durene Cal yesterday and EKG was done showing short PR interval.  EKG reviewed by DOD with concern for WPW and appointment made.  Patient denies recurrent episode this morning.   Past Medical History:  Diagnosis Date  . Anemia 12/22/2017  . Arthritis   . Chronic lower back pain   . Cluster headache   . Coronary artery disease    a. 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 non obs dz LHC 2007. c. Non obs dz (mild-mod) by [facility] 05/17/14 d. cath 05/26/17 mild non obstructive CAD  . COVID-19 08/07/2019  . Diverticulitis    a. Hx microperf 2012 - hospitalizated.  Marland Kitchen GERD (gastroesophageal reflux disease)   . Hypercholesteremia   . Hypertension   . Overactive bladder 09/19/2014   Oxybutynin 5mg  XL--> 10mg   . PVC's (premature ventricular contractions)    a. Hx of  trigeminy/bigeminy.  . Seizures (HCC)   . Thyroid disease    hyperthyroid  . UTI (lower urinary tract infection)     Past Surgical History:  Procedure Laterality Date  . ABDOMINAL HYSTERECTOMY  1987   "partial"-still has ovaries  . CARDIAC CATHETERIZATION  05/17/2014  . CORONARY ANGIOPLASTY WITH STENT PLACEMENT  1995   "1"  . JOINT REPLACEMENT     right knee  . LEFT HEART CATH AND CORONARY ANGIOGRAPHY N/A 05/26/2017   Procedure: Left Heart Cath and Coronary Angiography;  Surgeon: 05/19/2014, MD;  Location: [facility] INVASIVE CV LAB;  Service: Cardiovascular;  Laterality: N/A;  . LEFT HEART CATHETERIZATION WITH CORONARY ANGIOGRAM N/A 05/17/2014   Procedure: LEFT HEART CATHETERIZATION WITH CORONARY ANGIOGRAM;  Surgeon: [facility], MD;  Location: [facility] CATH LAB;  Service: Cardiovascular;  Laterality: N/A;  . TOTAL KNEE ARTHROPLASTY Right 2005  . TUBAL LIGATION  1970  . VASCULAR SURGERY      Current Medications: Prior to Admission medications   Medication Sig Start Date End Date Taking? Authorizing Provider  amLODipine (NORVASC) 10 MG tablet Take 5 mg by mouth daily.    [provider]  ASPIRIN LOW DOSE PO Take 81 mg by mouth daily.    [provider]  atorvastatin (LIPITOR) 40 MG tablet Take 1 tablet (40 mg total) by mouth daily. 03/05/20   2006, MD  Cholecalciferol 25 MCG (1000 UT) tablet Take 1,000 Units by  mouth daily.     [provider]  Cyanocobalamin (VITAMIN B-12) 3000 MCG SUBL Place 3,000 mg under the tongue daily.    [provider]  esomeprazole (NEXIUM) 40 MG capsule TAKE 1 CAPSULE(40 MG) BY MOUTH DAILY 09/08/19   Shelva Majestic, MD  ezetimibe (ZETIA) 10 MG tablet TAKE 1 TABLET(10 MG) BY MOUTH DAILY 09/08/19   Shelva Majestic, MD  furosemide (LASIX) 20 MG tablet Take 1 tablet (20 mg total) by mouth daily. 10/16/19   Berton Bon, NP  isosorbide mononitrate (IMDUR) 120 MG 24 hr tablet Take 1 tablet (120 mg total) by  mouth daily. 12/28/19   Georgie Chard D, NP  loperamide (IMODIUM) 2 MG capsule Take 1 capsule (2 mg total) by mouth as needed for diarrhea or loose stools. 10/04/17   Regalado, Belkys A, MD  methimazole (TAPAZOLE) 10 MG tablet Take 3 tablets (30 mg total) by mouth daily. 01/30/20   Romero Belling, MD  metoprolol succinate (TOPROL-XL) 25 MG 24 hr tablet Take 1 tablet (25 mg total) by mouth daily. 12/28/19   Georgie Chard D, NP  nitroGLYCERIN (NITROSTAT) 0.4 MG SL tablet PLACE 1 TABLET UNDER THE TONGUE EVERY 5 MINUTES FOR CHEST PAIN FOR 3 DOSES 08/21/19   Shelva Majestic, MD  oxybutynin (DITROPAN-XL) 10 MG 24 hr tablet TAKE 1 TABLET BY MOUTH ONCE DAILY 12/29/18   Shelva Majestic, MD  oxyCODONE-acetaminophen (PERCOCET) 10-325 MG tablet Take 1 tablet by mouth 2 (two) times daily as needed. 08/25/19   [provider]  potassium chloride (MICRO-K) 10 MEQ CR capsule TAKE 1 CAPSULE BY MOUTH TWICE DAILY 06/26/19   Shelva Majestic, MD  Prenatal Vit-Fe Fumarate-FA (PRENATAL MULTIVITAMIN) TABS tablet Take 1 tablet by mouth daily at 12 noon.    [provider]  venlafaxine XR (EFFEXOR-XR) 150 MG 24 hr capsule TAKE 1 CAPSULE BY MOUTH EVERY NIGHT AT BEDTIME 01/09/20   Shelva Majestic, MD  vitamin C (VITAMIN C) 500 MG tablet Take 1 tablet (500 mg total) by mouth 2 (two) times daily. 08/08/19   Burnadette Pop, MD  Vitamin E 180 MG CAPS Take 2 capsules by mouth daily.    [provider]  zinc sulfate 220 (50 Zn) MG capsule Take 1 capsule (220 mg total) by mouth daily. 08/09/19   Burnadette Pop, MD    Allergies:   Crestor [rosuvastatin] and Sulfa drugs cross reactors   Social History   Socioeconomic History  . Marital status: Married    Spouse name: Not on file  . Number of children: 4  . Years of education: 4  . Highest education level: Not on file  Occupational History    Comment: retired  Tobacco Use  . Smoking status: Never Smoker  . Smokeless tobacco: Never Used  Substance  and Sexual Activity  . Alcohol use: No  . Drug use: No  . Sexual activity: Never  Other Topics Concern  . Not on file  Social History Narrative   Separated. 4 children from first marriage. 16 grandkids.       Retired from [employer] for 25 years, went to [employer] for 5 years. Retired 2003 after MI      Hobbies: babysit/active with children      Right-handed      Caffeine: 24 oz soda per day      Social Determinants of Health   Financial Resource Strain:   . Difficulty of Paying Living Expenses:   Food Insecurity:   .  Worried About Charity fundraiser in the Last Year:   . Arboriculturist in the Last Year:   Transportation Needs:   . Film/video editor (Medical):   Marland Kitchen Lack of Transportation (Non-Medical):   Physical Activity:   . Days of Exercise per Week:   . Minutes of Exercise per Session:   Stress:   . Feeling of Stress :   Social Connections:   . Frequency of Communication with Friends and Family:   . Frequency of Social Gatherings with Friends and Family:   . Attends Religious Services:   . Active Member of Clubs or Organizations:   . Attends Archivist Meetings:   Marland Kitchen Marital Status:      Family History:  The patient's family history includes CAD in her brother and father; Diabetes in her brother, father, mother, sister, and sister; Heart attack in her brother; Prostate cancer in her brother; Thyroid disease in her sister and sister.  ROS:   Please see the history of present illness.    ROS All other systems reviewed and are negative.   PHYSICAL EXAM:   VS:  BP 128/64   Pulse 72   Ht 5\' 6"  (1.676 m)   Wt 156 lb 9.6 oz (71 kg)   SpO2 98%   BMI 25.28 kg/m    GEN: Well nourished, well developed, in no acute distress  HEENT: normal  Neck: no JVD, carotid bruits, or masses Cardiac: RRR; no murmurs, rubs, or gallops,no edema  Respiratory:  clear to auscultation bilaterally, normal work of breathing GI: soft, nontender, nondistended, + BS MS: no  deformity or atrophy  Skin: warm and dry, no rash Neuro:  Alert and Oriented x 3, Strength and sensation are intact Psych: euthymic mood, full affect  Wt Readings from Last 3 Encounters:  03/06/20 156 lb 9.6 oz (71 kg)  03/05/20 155 lb (70.3 kg)  02/14/20 157 lb 9.6 oz (71.5 kg)      Studies/Labs Reviewed:   EKG:  EKG is not ordered today.  The ekg done 03/05/2020 demonstrates sinus rhythm at rate of 69 bpm, short PR interval  Recent Labs: 08/09/2019: B Natriuretic Peptide 386.1 10/12/2019: NT-Pro BNP 932 01/09/2020: TSH 2.85 02/03/2020: Magnesium 1.8 02/14/2020: ALT 18; BUN 15; Creatinine, Ser 1.00; Hemoglobin 12.1; Platelets 226.0; Potassium 4.3; Sodium 138   Lipid Panel    Component Value Date/Time   CHOL 164 01/09/2020 1331   TRIG 110.0 01/09/2020 1331   HDL 58.10 01/09/2020 1331   CHOLHDL 3 01/09/2020 1331   VLDL 22.0 01/09/2020 1331   LDLCALC 84 01/09/2020 1331    Additional studies/ records that were reviewed today include:   Echocardiogram: 11/2019 IMPRESSIONS   1. Left ventricular ejection fraction, by estimation, is 55 to 60%. The  left ventricle has normal function.  2. Mild to moderate mitral valve regurgitation.  3. There is normal pulmonary artery systolic pressure.   Cardiac Catheterization: 05/2017 Left Heart Cath and Coronary Angiography  Conclusion    Prox RCA to Mid RCA lesion, 10 %stenosed.  Dist Cx lesion, 20 %stenosed.  Mid LAD lesion, 10 %stenosed.  There is no aortic valve stenosis.  Ost 1st Mrg lesion, 25 %stenosed.  The left ventricular systolic function is normal.  LV end diastolic pressure is normal.  The left ventricular ejection fraction is 55-65% by visual estimate.   No obstructive CAD.  Likely demand ischemia.  Continue preventive therapy.      ASSESSMENT & PLAN:   1.  Chest pressure and shortness of breath 2.  History of coronary vasospasm 3.  Mild nonobstructive CAD 4.  Possible WPW 5. Chronic diastolic  CHF  Patient with prior history of coronary vasospasm and last cardiac cath in 2018 showing mild nonobstructive CAD.  Worsening chest discomfort and shortness of breath over the weekend which minimally improved after sublingual nitroglycerin EKG with short PR interval and concerning for WPW which is consistent with her presenting symptoms.  Patient never checked her heart rate.  Reviewed with DOD Dr. Johney Frame as well as Dr. Ladona Ridgel.  Plan to get 30-day event monitor and follow-up with Dr. Ladona Ridgel afterwards.  Patient is agree with plan.     Medication Adjustments/Labs and Tests Ordered: Current medicines are reviewed at length with the patient today.  Concerns regarding medicines are outlined above.  Medication changes, Labs and Tests ordered today are listed in the Patient Instructions below. Patient Instructions  Medication Instructions:  Your physician recommends that you continue on your current medications as directed. Please refer to the Current Medication list given to you today.  *If you need a refill on your cardiac medications before your next appointment, please call your pharmacy*   Lab Work: None ordered  If you have labs (blood work) drawn today and your tests are completely normal, you will receive your results only by: Marland Kitchen MyChart Message (if you have MyChart) OR . A paper copy in the mail If you have any lab test that is abnormal or we need to change your treatment, we will call you to review the results.   Testing/Procedures: Your physician has recommended that you wear an event monitor. Event monitors are medical devices that record the heart's electrical activity. Doctors most often [place] these monitors to diagnose arrhythmias. Arrhythmias are problems with the speed or rhythm of the heartbeat. The monitor is a small, portable device. You can wear one while you do your normal daily activities. This is usually used to diagnose what is causing palpitations/syncope (passing  out).   You have been referred to Dr. Ladona Ridgel, Electrophysiologists.  He is located here at the same office as Dr. Katrinka Blazing.    Follow-Up: At [facility], you and your health needs are our priority.  As part of our continuing mission to provide you with exceptional heart care, we have created designated Provider Care Teams.  These Care Teams include your primary Cardiologist (physician) and Advanced Practice Providers (APPs -  Physician Assistants and Nurse Practitioners) who all work together to provide you with the care you need, when you need it.  We recommend signing up for the patient portal called "MyChart".  Sign up information is provided on this After Visit Summary.  MyChart is used to connect with patients for Virtual Visits (Telemedicine).  Patients are able to view lab/test results, encounter notes, upcoming appointments, etc.  Non-urgent messages can be sent to your provider as well.   To learn more about what you can do with MyChart, go to [email or web address].    Your next appointment:   6 week(s)  The format for your next appointment:   In Person  Provider:   Lewayne Bunting, MD   Other Instructions      Signed, Manson Passey, PA  03/06/2020 4:17 PM    [facility] [address] [place], [place], [place]  [address] Phone: [phone]; Fax: [phone]

## 2020-03-06 NOTE — Telephone Encounter (Signed)
Patient enrolled for [employer] to ship a 30 day cardiac event monitor to her home.  Instruction will be mailed and are included in the monitor kit.

## 2020-03-19 ENCOUNTER — Ambulatory Visit (INDEPENDENT_AMBULATORY_CARE_PROVIDER_SITE_OTHER): Payer: Medicare Other

## 2020-03-19 DIAGNOSIS — I10 Essential (primary) hypertension: Secondary | ICD-10-CM | POA: Diagnosis not present

## 2020-03-19 DIAGNOSIS — I456 Pre-excitation syndrome: Secondary | ICD-10-CM | POA: Diagnosis not present

## 2020-04-09 ENCOUNTER — Other Ambulatory Visit: Payer: Self-pay | Admitting: Family Medicine

## 2020-04-10 ENCOUNTER — Other Ambulatory Visit: Payer: Self-pay | Admitting: Family Medicine

## 2020-04-16 DIAGNOSIS — I10 Essential (primary) hypertension: Secondary | ICD-10-CM | POA: Diagnosis not present

## 2020-04-23 ENCOUNTER — Institutional Professional Consult (permissible substitution): Payer: Medicare Other | Admitting: Internal Medicine

## 2020-05-01 ENCOUNTER — Ambulatory Visit: Payer: Medicare Other | Admitting: Endocrinology

## 2020-05-01 DIAGNOSIS — Z0289 Encounter for other administrative examinations: Secondary | ICD-10-CM

## 2020-05-14 DIAGNOSIS — I10 Essential (primary) hypertension: Secondary | ICD-10-CM | POA: Diagnosis not present

## 2020-05-22 ENCOUNTER — Institutional Professional Consult (permissible substitution): Payer: Medicare Other | Admitting: Internal Medicine

## 2020-06-03 ENCOUNTER — Encounter: Payer: Self-pay | Admitting: Internal Medicine

## 2020-06-11 DIAGNOSIS — I10 Essential (primary) hypertension: Secondary | ICD-10-CM | POA: Diagnosis not present

## 2020-06-30 ENCOUNTER — Other Ambulatory Visit: Payer: Self-pay | Admitting: Family Medicine

## 2020-07-16 ENCOUNTER — Other Ambulatory Visit: Payer: Self-pay | Admitting: Cardiology

## 2020-08-30 ENCOUNTER — Other Ambulatory Visit: Payer: Self-pay | Admitting: Family Medicine

## 2020-10-11 ENCOUNTER — Other Ambulatory Visit: Payer: Self-pay | Admitting: Cardiology

## 2020-10-11 ENCOUNTER — Other Ambulatory Visit: Payer: Self-pay | Admitting: *Deleted

## 2020-10-11 DIAGNOSIS — E0590 Thyrotoxicosis, unspecified without thyrotoxic crisis or storm: Secondary | ICD-10-CM

## 2020-10-11 MED ORDER — METHIMAZOLE 10 MG PO TABS: 30.0000 mg | ORAL_TABLET | Freq: Every day | ORAL | 1 refills | Status: DC

## 2020-10-14 ENCOUNTER — Other Ambulatory Visit: Payer: Self-pay

## 2020-10-14 MED ORDER — NITROGLYCERIN 0.4 MG SL SUBL: SUBLINGUAL_TABLET | SUBLINGUAL | 0 refills | Status: DC

## 2020-10-15 ENCOUNTER — Other Ambulatory Visit: Payer: Self-pay

## 2020-10-15 ENCOUNTER — Telehealth: Payer: Self-pay

## 2020-10-15 NOTE — Telephone Encounter (Signed)
.   LAST APPOINTMENT DATE: 10/14/2020   NEXT APPOINTMENT DATE:@1 /08/2021  MEDICATION:nitroGLYCERIN (NITROSTAT) 0.4 MG SL tablet  PHARMACY:[id number] - [place], [place] - [address]  **Let patient know to contact pharmacy at the end of the day to make sure medication is ready. **  ** Please notify patient to allow 48-72 hours to process**  **Encourage patient to contact the pharmacy for refills or they can request refills through [facility]**  CLINICAL FILLS OUT ALL BELOW:   LAST REFILL:  QTY:  REFILL DATE:    OTHER COMMENTS:    Okay for refill?  Please advise

## 2020-10-15 NOTE — Telephone Encounter (Signed)
Medication was refilled yesterday. Pharmacy and/or patient requesting refill too soon.

## 2020-10-21 ENCOUNTER — Other Ambulatory Visit: Payer: Self-pay | Admitting: Cardiology

## 2020-11-04 NOTE — Progress Notes (Deleted)
Phone [phone] In person visit   Subjective:   [name] is a 75 y.o. year old very pleasant female patient who presents for/with See problem oriented charting No chief complaint on file.   This visit occurred during the SARS-CoV-2 public health emergency.  Safety protocols were in place, including screening questions prior to the visit, additional usage of staff PPE, and extensive cleaning of exam room while observing appropriate contact time as indicated for disinfecting solutions.   Past Medical History-  Patient Active Problem List   Diagnosis Date Noted  . WPW (Wolff-Parkinson-White syndrome) 03/05/2020  . Chest pain 02/03/2020  . Aortic atherosclerosis (HCC) 01/09/2020  . Heart failure with reduced ejection fraction (HCC) 08/21/2019  . COVID-19 08/07/2019  . Anemia 12/22/2017  . Demand ischemia (HCC)   . Hyperthyroidism 05/22/2017  . Overactive bladder 09/19/2014  . Migraine 09/19/2014  . Hypokalemia 06/01/2014  . Coronary artery disease involving native coronary artery of native heart with angina pectoris (HCC)   . Seizures (HCC)   . Arthritis   . Hypercholesteremia   . GERD (gastroesophageal reflux disease) 10/04/2011  . HTN (hypertension) 10/01/2011    Medications- reviewed and updated Current Outpatient Medications  Medication Sig Dispense Refill  . Aloe Vera 25 MG CAPS Take by mouth.    Marland Kitchen amLODipine (NORVASC) 10 MG tablet TAKE 1 TABLET(10 MG) BY MOUTH DAILY 90 tablet 3  . ASPIRIN LOW DOSE PO Take 81 mg by mouth daily.    Marland Kitchen atorvastatin (LIPITOR) 40 MG tablet Take 1 tablet (40 mg total) by mouth daily. 90 tablet 3  . Cholecalciferol (VITAMIN D3) 25 MCG (1000 UT) CAPS Take by mouth.    . Cobalamin Combinations (NEURIVA PLUS) CAPS Take by mouth.    . Cyanocobalamin (VITAMIN B-12) 3000 MCG SUBL Place 3,000 mg under the tongue daily.    Marland Kitchen esomeprazole (NEXIUM) 40 MG capsule TAKE 1 CAPSULE(40 MG) BY MOUTH DAILY 90 capsule 2  . ezetimibe (ZETIA) 10 MG tablet  TAKE 1 TABLET(10 MG) BY MOUTH DAILY 90 tablet 2  . furosemide (LASIX) 20 MG tablet Take 1 tablet (20 mg total) by mouth daily. 90 tablet 3  . isosorbide mononitrate (IMDUR) 120 MG 24 hr tablet TAKE 1 TABLET(120 MG) BY MOUTH DAILY 90 tablet 3  . methimazole (TAPAZOLE) 10 MG tablet Take 3 tablets (30 mg total) by mouth daily. 360 tablet 1  . metoprolol succinate (TOPROL-XL) 25 MG 24 hr tablet Take 1 tablet (25 mg total) by mouth daily. Please call the office and schedule a follow up appointment 90 tablet 1  . nitroGLYCERIN (NITROSTAT) 0.4 MG SL tablet PLACE 1 TABLET UNDER THE TONGUE EVERY 5 MINUTES FOR CHEST PAIN FOR 3 DOSES 25 tablet 0  . oxybutynin (DITROPAN-XL) 10 MG 24 hr tablet TAKE 1 TABLET BY MOUTH EVERY DAY 90 tablet 3  . oxyCODONE-acetaminophen (PERCOCET) 10-325 MG tablet Take 1 tablet by mouth 2 (two) times daily as needed.    . potassium chloride (MICRO-K) 10 MEQ CR capsule TAKE 1 CAPSULE BY MOUTH TWICE DAILY 180 capsule 1  . Prenatal Vit-Fe Fumarate-FA (PRENATAL MULTIVITAMIN) TABS tablet Take 1 tablet by mouth daily at 12 noon.    . venlafaxine XR (EFFEXOR-XR) 150 MG 24 hr capsule TAKE ONE CAPSULE BY MOUTH AT BEDTIME- NEED VISIT 30 capsule 5  . vitamin C (VITAMIN C) 500 MG tablet Take 1 tablet (500 mg total) by mouth 2 (two) times daily. 14 tablet 0  . Vitamin E 180 MG CAPS Take 2 capsules  by mouth daily.    Marland Kitchen zinc sulfate 220 (50 Zn) MG capsule Take 1 capsule (220 mg total) by mouth daily. 7 capsule 0   No current facility-administered medications for this visit.     Objective:  There were no vitals taken for this visit. Gen: NAD, resting comfortably CV: RRR no murmurs rubs or gallops Lungs: CTAB no crackles, wheeze, rhonchi Abdomen: soft/nontender/nondistended/normal bowel sounds. No rebound or guarding.  Ext: no edema Skin: warm, dry Neuro: grossly normal, moves all extremities  ***    Assessment and Plan  Hyperthyroidism S:***  A/P: ***   # GERD S:Medication: Nexium  40Mg  B12 levels related to PPI use: No results found for: VITAMINB12 A/P: ***   #hypertension S: medication: Metoprolol 25Mg , imdur 120Mg , amlodpine 10Mg  Home readings #s: *** BP Readings from Last 3 Encounters:  03/06/20 128/64  03/05/20 138/82  02/14/20 130/72  A/P: ***  #hyperlipidemia S: Medication: Zetia 10Mg  Lab Results  Component Value Date   CHOL 164 01/09/2020   HDL 58.10 01/09/2020   LDLCALC 84 01/09/2020   TRIG 110.0 01/09/2020   CHOLHDL 3 01/09/2020   A/P: ***    awv 01/09/20 courtney*** last cpe *** over 1 year  ***  2. Mild to moderate mitral valve regurgitation.  February 2021  ***Your cholesterol was above goal-are you missing any doses of the atorvastatin 20 mg?  If you are not missing any doses team please stop the atorvastatin 20 mg and start her on rosuvastatin 20 mg daily #90 with 3 refills  No problem-specific Assessment & Plan notes found for this encounter.   Recommended follow up: ***No follow-ups on file. Future Appointments  Date Time Provider Department Center  11/05/2020  9:20 AM 01/11/2020, MD [facility]  11/21/2020  2:45 PM 01/11/20, PA [facility]    Lab/Order associations: No diagnosis found.  No orders of the defined types were placed in this encounter.   Time Spent: *** minutes of total time (12:02 PM***- 12:02 PM***) was spent on the date of the encounter performing the following actions: chart review prior to seeing the patient, obtaining history, performing a medically necessary exam, counseling on the treatment plan, placing orders, and documenting in our EHR.   Return precautions advised.  March 2021, CMA

## 2020-11-04 NOTE — Patient Instructions (Incomplete)
Health Maintenance Due  Topic Date Due  . COVID-19 Vaccine (1) Never done  . COLONOSCOPY (Pts 45-49yrs Insurance coverage will need to be confirmed)  Never done  . INFLUENZA VACCINE  05/26/2020   Depression screen PHQ 2/9 02/14/2020 01/09/2020 01/09/2020  Decreased Interest 0 0 0  Down, Depressed, Hopeless 0 2 2  PHQ - 2 Score 0 2 2  Altered sleeping 2 0 1  Tired, decreased energy 2 3 3  Change in appetite 0 0 0  Feeling bad or failure about yourself  0 0 0  Trouble concentrating 0 0 -  Moving slowly or fidgety/restless 0 0 0  Suicidal thoughts 0 0 0  PHQ-9 Score 4 5 6  Difficult doing work/chores Not difficult at all Not difficult at all Not difficult at all    

## 2020-11-05 ENCOUNTER — Ambulatory Visit: Payer: Medicare Other | Admitting: Family Medicine

## 2020-11-05 DIAGNOSIS — K219 Gastro-esophageal reflux disease without esophagitis: Secondary | ICD-10-CM

## 2020-11-05 DIAGNOSIS — E0590 Thyrotoxicosis, unspecified without thyrotoxic crisis or storm: Secondary | ICD-10-CM

## 2020-11-05 DIAGNOSIS — I10 Essential (primary) hypertension: Secondary | ICD-10-CM

## 2020-11-21 ENCOUNTER — Encounter: Payer: Self-pay | Admitting: Physician Assistant

## 2020-11-21 ENCOUNTER — Other Ambulatory Visit: Payer: Self-pay

## 2020-11-21 ENCOUNTER — Ambulatory Visit (INDEPENDENT_AMBULATORY_CARE_PROVIDER_SITE_OTHER): Payer: Medicare Other | Admitting: Physician Assistant

## 2020-11-21 ENCOUNTER — Encounter: Payer: Self-pay | Admitting: *Deleted

## 2020-11-21 VITALS — BP 114/70 | HR 69 | Ht 66.0 in | Wt 159.2 lb

## 2020-11-21 DIAGNOSIS — I5032 Chronic diastolic (congestive) heart failure: Secondary | ICD-10-CM

## 2020-11-21 DIAGNOSIS — I10 Essential (primary) hypertension: Secondary | ICD-10-CM

## 2020-11-21 DIAGNOSIS — I456 Pre-excitation syndrome: Secondary | ICD-10-CM | POA: Diagnosis not present

## 2020-11-21 DIAGNOSIS — I201 Angina pectoris with documented spasm: Secondary | ICD-10-CM | POA: Diagnosis not present

## 2020-11-21 DIAGNOSIS — I200 Unstable angina: Secondary | ICD-10-CM

## 2020-11-21 NOTE — Progress Notes (Signed)
Cardiology Office Note:    Date:  11/21/2020   ID:  [name], DOB 1946-09-18, MRN [id number]  PCP:  Shelva Majestic, MD  [facility] Cardiologist:  Lesleigh Noe, MD  [facility] Electrophysiologist:  None   Chief Complaint: 6 months follow up   History of Present Illness:    [name] is a 75 y.o. female with a hx of non-obstructive CAD on cardiac cath in 2018, coronary spasmnoted on cardiac cath in 2003,chroniccombine systolic anddiastolic CHF with improved LVEF, mitral regurgitations, hypertension, hyperlipidemia, Grave's disease with multinodular goiter (followed by Dr. Everardo All), GERD, and chronic lower back pain presents for follow up.  LVEF of 35-40% in setting of +COVID 19 on 07/2019. Last echo 11/2019 showed improved LV function to 55-60% and mild to moderate MR.    Seen by me 02/2020 for chest pain which was nitro unresponsive. EKG with short PR interval and concerning for WPW which is consistent with her presenting symptoms. Reviewed with DOD Dr. Johney Frame as well as Dr. Ladona Ridgel.  Plan to get 30-day event monitor and follow-up with Dr. Ladona Ridgel afterwards.    Followed up monitor consistent with WPW however he never followed up with Dr. Ladona Ridgel.   Here today for follow-up.  Reported good days and bad days.  Reports intermittent left upper chest pain radiating to her arm.  She takes nitroglycerin frequently with resolution of her symptoms.  Some nonspecific cardiac complaint as well.  She is poor historian.  Cannot tell if she has associated palpitation.  Reports hand swelling.  No orthopnea, PND, syncope or melena.  Compliant with medication.  Past Medical History:  Diagnosis Date  . Anemia 12/22/2017  . Arthritis   . Chronic lower back pain   . Cluster headache   . Coronary artery disease    a. 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 non obs dz LHC 2007. c. Non obs dz (mild-mod) by [facility] 05/17/14 d. cath 05/26/17 mild non obstructive CAD  . COVID-19 08/07/2019  . Diverticulitis     a. Hx microperf 2012 - hospitalizated.  Marland Kitchen GERD (gastroesophageal reflux disease)   . Hypercholesteremia   . Hypertension   . Overactive bladder 09/19/2014   Oxybutynin 5mg  XL--> 10mg   . PVC's (premature ventricular contractions)    a. Hx of trigeminy/bigeminy.  . Seizures (HCC)   . Thyroid disease    hyperthyroid  . UTI (lower urinary tract infection)     Past Surgical History:  Procedure Laterality Date  . ABDOMINAL HYSTERECTOMY  1987   "partial"-still has ovaries  . CARDIAC CATHETERIZATION  05/17/2014  . CORONARY ANGIOPLASTY WITH STENT PLACEMENT  1995   "1"  . JOINT REPLACEMENT     right knee  . LEFT HEART CATH AND CORONARY ANGIOGRAPHY N/A 05/26/2017   Procedure: Left Heart Cath and Coronary Angiography;  Surgeon: Corky Crafts, MD;  Location: [facility] INVASIVE CV LAB;  Service: Cardiovascular;  Laterality: N/A;  . LEFT HEART CATHETERIZATION WITH CORONARY ANGIOGRAM N/A 05/17/2014   Procedure: LEFT HEART CATHETERIZATION WITH CORONARY ANGIOGRAM;  Surgeon: Marykay Lex, MD;  Location: [facility] CATH LAB;  Service: Cardiovascular;  Laterality: N/A;  . TOTAL KNEE ARTHROPLASTY Right 2005  . TUBAL LIGATION  1970  . VASCULAR SURGERY      Current Medications: Current Meds  Medication Sig  . Aloe Vera 25 MG CAPS Take by mouth.  Marland Kitchen amLODipine (NORVASC) 10 MG tablet TAKE 1 TABLET(10 MG) BY MOUTH DAILY  . ASPIRIN LOW DOSE PO Take 81 mg by  mouth daily.  Marland Kitchen atorvastatin (LIPITOR) 40 MG tablet Take 1 tablet (40 mg total) by mouth daily.  . Cholecalciferol (VITAMIN D3) 25 MCG (1000 UT) CAPS Take by mouth.  . Cobalamin Combinations (NEURIVA PLUS) CAPS Take by mouth.  . Cyanocobalamin (VITAMIN B-12) 3000 MCG SUBL Place 3,000 mg under the tongue daily.  Marland Kitchen esomeprazole (NEXIUM) 40 MG capsule TAKE 1 CAPSULE(40 MG) BY MOUTH DAILY  . ezetimibe (ZETIA) 10 MG tablet TAKE 1 TABLET(10 MG) BY MOUTH DAILY  . furosemide (LASIX) 20 MG tablet Take 1 tablet (20 mg total) by mouth daily.  . isosorbide  mononitrate (IMDUR) 120 MG 24 hr tablet TAKE 1 TABLET(120 MG) BY MOUTH DAILY  . methimazole (TAPAZOLE) 10 MG tablet Take 3 tablets (30 mg total) by mouth daily.  . metoprolol succinate (TOPROL-XL) 25 MG 24 hr tablet Take 1 tablet (25 mg total) by mouth daily. Please call the office and schedule a follow up appointment  . nitroGLYCERIN (NITROSTAT) 0.4 MG SL tablet PLACE 1 TABLET UNDER THE TONGUE EVERY 5 MINUTES FOR CHEST PAIN FOR 3 DOSES  . oxybutynin (DITROPAN-XL) 10 MG 24 hr tablet TAKE 1 TABLET BY MOUTH EVERY DAY  . oxyCODONE-acetaminophen (PERCOCET) 10-325 MG tablet Take 1 tablet by mouth 2 (two) times daily as needed.  . potassium chloride (MICRO-K) 10 MEQ CR capsule TAKE 1 CAPSULE BY MOUTH TWICE DAILY  . Prenatal Vit-Fe Fumarate-FA (PRENATAL MULTIVITAMIN) TABS tablet Take 1 tablet by mouth daily at 12 noon.  . venlafaxine XR (EFFEXOR-XR) 150 MG 24 hr capsule TAKE ONE CAPSULE BY MOUTH AT BEDTIME- NEED VISIT  . vitamin C (VITAMIN C) 500 MG tablet Take 1 tablet (500 mg total) by mouth 2 (two) times daily.  . Vitamin E 180 MG CAPS Take 2 capsules by mouth daily.  Marland Kitchen zinc sulfate 220 (50 Zn) MG capsule Take 1 capsule (220 mg total) by mouth daily.     Allergies:   Crestor [rosuvastatin] and Sulfa drugs cross reactors   Social History   Socioeconomic History  . Marital status: Married    Spouse name: Not on file  . Number of children: 4  . Years of education: 4  . Highest education level: Not on file  Occupational History    Comment: retired  Tobacco Use  . Smoking status: Never Smoker  . Smokeless tobacco: Never Used  Vaping Use  . Vaping Use: Never used  Substance and Sexual Activity  . Alcohol use: No  . Drug use: No  . Sexual activity: Never  Other Topics Concern  . Not on file  Social History Narrative   Separated. 4 children from first marriage. 16 grandkids.       Retired from [employer] for 25 years, went to [employer] for 5 years. Retired 2003 after MI      Hobbies:  babysit/active with children      Right-handed      Caffeine: 24 oz soda per day      Social Determinants of Corporate investment banker Strain: Not on file  Food Insecurity: Not on file  Transportation Needs: Not on file  Physical Activity: Not on file  Stress: Not on file  Social Connections: Not on file     Family History: The patient's family history includes CAD in her brother and father; Diabetes in her brother, father, mother, sister, and sister; Heart attack in her brother; Prostate cancer in her brother; Thyroid disease in her sister and sister.    ROS:  Please see the history of present illness.    All other systems reviewed and are negative.   EKGs/Labs/Other Studies Reviewed:    The following studies were reviewed today:  Monitor 03/2020  The basic underlying rhythm is normal sinus rhythm.  Short PR interval and widened QRS wave is diagnostic of WPW.  Nonsustained supraventricular tachycardia lasting 5 beats.  Nonsustained wide-complex tachycardia less than 5 beats.  Symptoms do not correlate with arrhythmia.  Echo 11/2019 1. Left ventricular ejection fraction, by estimation, is 55 to 60%. The  left ventricle has normal function.  2. Mild to moderate mitral valve regurgitation.  3. There is normal pulmonary artery systolic pressure.   Left Heart Cath and Coronary Angiography 05/2017    Conclusion    Prox RCA to Mid RCA lesion, 10 %stenosed.  Dist Cx lesion, 20 %stenosed.  Mid LAD lesion, 10 %stenosed.  There is no aortic valve stenosis.  Ost 1st Mrg lesion, 25 %stenosed.  The left ventricular systolic function is normal.  LV end diastolic pressure is normal.  The left ventricular ejection fraction is 55-65% by visual estimate.   No obstructive CAD.  Likely demand ischemia.  Continue preventive therapy.    EKG:  EKG is not  ordered today.    Recent Labs: 01/09/2020: TSH 2.85 02/03/2020: Magnesium 1.8 02/14/2020: ALT 18; BUN 15;  Creatinine, Ser 1.00; Hemoglobin 12.1; Platelets 226.0; Potassium 4.3; Sodium 138  Recent Lipid Panel    Component Value Date/Time   CHOL 164 01/09/2020 1331   TRIG 110.0 01/09/2020 1331   HDL 58.10 01/09/2020 1331   CHOLHDL 3 01/09/2020 1331   VLDL 22.0 01/09/2020 1331   LDLCALC 84 01/09/2020 1331    Physical Exam:    VS:  BP 114/70   Pulse 69   Ht 5\' 6"  (1.676 m)   Wt 159 lb 3.2 oz (72.2 kg)   SpO2 96%   BMI 25.70 kg/m     Wt Readings from Last 3 Encounters:  11/21/20 159 lb 3.2 oz (72.2 kg)  03/06/20 156 lb 9.6 oz (71 kg)  03/05/20 155 lb (70.3 kg)     GEN: Well nourished, well developed in no acute distress HEENT: Normal NECK: No JVD; No carotid bruits LYMPHATICS: No lymphadenopathy CARDIAC:RRR, no murmurs, rubs, gallops RESPIRATORY:  Clear to auscultation without rales, wheezing or rhonchi  ABDOMEN: Soft, non-tender, non-distended MUSCULOSKELETAL:  No edema; No deformity  SKIN: Warm and dry NEUROLOGIC:  Alert and oriented x 3 PSYCHIATRIC:  Normal affect   ASSESSMENT AND PLAN:    1. Chest discomfort with history of coronary vasospasm and nonobstructive CAD by cath in 2018 -She is poor historian but seems symptoms concerning for unstable angina.  She can tell that nitroglycerin helps to improve her symptoms but not all the time.  Cannot increase beta-blocker given WPW.  She is on higher dose of Imdur and amlodipine.  Reviewed with DOD Dr. 2019.  Plan to get exercise Myoview and follow-up with Dr. Eden Emms afterwards.  2.  WPW -Follow-up with Dr. Ladona Ridgel  3.  Chronic diastolic heart failure -Not volume overloaded on exam -Continue current medical therapy  4.  Hypertension -Blood pressure stable on current medications.  Shared Decision Making/Informed Consent The risks [chest pain, shortness of breath, cardiac arrhythmias, dizziness, blood pressure fluctuations, myocardial infarction, stroke/transient ischemic attack, nausea, vomiting, allergic reaction,  radiation exposure, metallic taste sensation and life-threatening complications (estimated to be 1 in 10,000)], benefits (risk stratification, diagnosing coronary artery disease, treatment guidance) and alternatives of  a nuclear stress test were discussed in detail with Ms. Raus and she agrees to proceed.    Medication Adjustments/Labs and Tests Ordered: Current medicines are reviewed at length with the patient today.  Concerns regarding medicines are outlined above.  Orders Placed This Encounter  Procedures  . MYOCARDIAL PERFUSION IMAGING   No orders of the defined types were placed in this encounter.   Patient Instructions  Medication Instructions:  Your physician recommends that you continue on your current medications as directed. Please refer to the Current Medication list given to you today.  *If you need a refill on your cardiac medications before your next appointment, please call your pharmacy*   Lab Work: None ordered  If you have labs (blood work) drawn today and your tests are completely normal, you will receive your results only by: Marland Kitchen MyChart Message (if you have MyChart) OR . A paper copy in the mail If you have any lab test that is abnormal or we need to change your treatment, we will call you to review the results.   Testing/Procedures: Your physician has requested that you have en exercise stress myoview. For further information please visit [email or web address] Please follow instruction sheet, as given.     Follow-Up: At [facility], you and your health needs are our priority.  As part of our continuing mission to provide you with exceptional heart care, we have created designated Provider Care Teams.  These Care Teams include your primary Cardiologist (physician) and Advanced Practice Providers (APPs -  Physician Assistants and Nurse Practitioners) who all work together to provide you with the care you need, when you need it.  We recommend signing up for  the patient portal called "MyChart".  Sign up information is provided on this After Visit Summary.  MyChart is used to connect with patients for Virtual Visits (Telemedicine).  Patients are able to view lab/test results, encounter notes, upcoming appointments, etc.  Non-urgent messages can be sent to your provider as well.   To learn more about what you can do with MyChart, go to [email or web address].    Your next appointment:   After Stress Test   The format for your next appointment:   In Person  Provider:   Lewayne Bunting, MD (please make while pt is at desk)   Other Instructions      Signed, Manson Passey, PA  11/21/2020 3:44 PM    [facility]

## 2020-11-21 NOTE — Patient Instructions (Signed)
Medication Instructions:  Your physician recommends that you continue on your current medications as directed. Please refer to the Current Medication list given to you today.  *If you need a refill on your cardiac medications before your next appointment, please call your pharmacy*   Lab Work: None ordered  If you have labs (blood work) drawn today and your tests are completely normal, you will receive your results only by: Marland Kitchen MyChart Message (if you have MyChart) OR . A paper copy in the mail If you have any lab test that is abnormal or we need to change your treatment, we will call you to review the results.   Testing/Procedures: Your physician has requested that you have en exercise stress myoview. For further information please visit [email or web address] Please follow instruction sheet, as given.     Follow-Up: At [facility], you and your health needs are our priority.  As part of our continuing mission to provide you with exceptional heart care, we have created designated Provider Care Teams.  These Care Teams include your primary Cardiologist (physician) and Advanced Practice Providers (APPs -  Physician Assistants and Nurse Practitioners) who all work together to provide you with the care you need, when you need it.  We recommend signing up for the patient portal called "MyChart".  Sign up information is provided on this After Visit Summary.  MyChart is used to connect with patients for Virtual Visits (Telemedicine).  Patients are able to view lab/test results, encounter notes, upcoming appointments, etc.  Non-urgent messages can be sent to your provider as well.   To learn more about what you can do with MyChart, go to [email or web address].    Your next appointment:   After Stress Test   The format for your next appointment:   In Person  Provider:   Lewayne Bunting, MD (please make while pt is at desk)   Other Instructions

## 2020-11-26 NOTE — Progress Notes (Deleted)
Phone [phone] In person visit   Subjective:   [name] is a 75 y.o. year old very pleasant female patient who presents for/with See problem oriented charting No chief complaint on file.   This visit occurred during the SARS-CoV-2 public health emergency.  Safety protocols were in place, including screening questions prior to the visit, additional usage of staff PPE, and extensive cleaning of exam room while observing appropriate contact time as indicated for disinfecting solutions.   Past Medical History-  Patient Active Problem List   Diagnosis Date Noted  . WPW (Wolff-Parkinson-White syndrome) 03/05/2020  . Chest pain 02/03/2020  . Aortic atherosclerosis (HCC) 01/09/2020  . Heart failure with reduced ejection fraction (HCC) 08/21/2019  . COVID-19 08/07/2019  . Anemia 12/22/2017  . Demand ischemia (HCC)   . Hyperthyroidism 05/22/2017  . Overactive bladder 09/19/2014  . Migraine 09/19/2014  . Hypokalemia 06/01/2014  . Coronary artery disease involving native coronary artery of native heart with angina pectoris (HCC)   . Seizures (HCC)   . Arthritis   . Hypercholesteremia   . GERD (gastroesophageal reflux disease) 10/04/2011  . HTN (hypertension) 10/01/2011    Medications- reviewed and updated Current Outpatient Medications  Medication Sig Dispense Refill  . Aloe Vera 25 MG CAPS Take by mouth.    Marland Kitchen amLODipine (NORVASC) 10 MG tablet TAKE 1 TABLET(10 MG) BY MOUTH DAILY 90 tablet 3  . ASPIRIN LOW DOSE PO Take 81 mg by mouth daily.    Marland Kitchen atorvastatin (LIPITOR) 40 MG tablet Take 1 tablet (40 mg total) by mouth daily. 90 tablet 3  . Cholecalciferol (VITAMIN D3) 25 MCG (1000 UT) CAPS Take by mouth.    . Cobalamin Combinations (NEURIVA PLUS) CAPS Take by mouth.    . Cyanocobalamin (VITAMIN B-12) 3000 MCG SUBL Place 3,000 mg under the tongue daily.    Marland Kitchen esomeprazole (NEXIUM) 40 MG capsule TAKE 1 CAPSULE(40 MG) BY MOUTH DAILY 90 capsule 2  . ezetimibe (ZETIA) 10 MG tablet  TAKE 1 TABLET(10 MG) BY MOUTH DAILY 90 tablet 2  . furosemide (LASIX) 20 MG tablet Take 1 tablet (20 mg total) by mouth daily. 90 tablet 3  . isosorbide mononitrate (IMDUR) 120 MG 24 hr tablet TAKE 1 TABLET(120 MG) BY MOUTH DAILY 90 tablet 3  . methimazole (TAPAZOLE) 10 MG tablet Take 3 tablets (30 mg total) by mouth daily. 360 tablet 1  . metoprolol succinate (TOPROL-XL) 25 MG 24 hr tablet Take 1 tablet (25 mg total) by mouth daily. Please call the office and schedule a follow up appointment 90 tablet 1  . nitroGLYCERIN (NITROSTAT) 0.4 MG SL tablet PLACE 1 TABLET UNDER THE TONGUE EVERY 5 MINUTES FOR CHEST PAIN FOR 3 DOSES 25 tablet 0  . oxybutynin (DITROPAN-XL) 10 MG 24 hr tablet TAKE 1 TABLET BY MOUTH EVERY DAY 90 tablet 3  . oxyCODONE-acetaminophen (PERCOCET) 10-325 MG tablet Take 1 tablet by mouth 2 (two) times daily as needed.    . potassium chloride (MICRO-K) 10 MEQ CR capsule TAKE 1 CAPSULE BY MOUTH TWICE DAILY 180 capsule 1  . Prenatal Vit-Fe Fumarate-FA (PRENATAL MULTIVITAMIN) TABS tablet Take 1 tablet by mouth daily at 12 noon.    . venlafaxine XR (EFFEXOR-XR) 150 MG 24 hr capsule TAKE ONE CAPSULE BY MOUTH AT BEDTIME- NEED VISIT 30 capsule 5  . vitamin C (VITAMIN C) 500 MG tablet Take 1 tablet (500 mg total) by mouth 2 (two) times daily. 14 tablet 0  . Vitamin E 180 MG CAPS Take 2 capsules  by mouth daily.    Marland Kitchen zinc sulfate 220 (50 Zn) MG capsule Take 1 capsule (220 mg total) by mouth daily. 7 capsule 0   No current facility-administered medications for this visit.     Objective:  There were no vitals taken for this visit. Gen: NAD, resting comfortably CV: RRR no murmurs rubs or gallops Lungs: CTAB no crackles, wheeze, rhonchi Abdomen: soft/nontender/nondistended/normal bowel sounds. No rebound or guarding.  Ext: no edema Skin: warm, dry Neuro: grossly normal, moves all extremities  ***    Assessment and Plan   #hyperthyroidism S: compliant On thyroid medication-***  Lab  Results  Component Value Date   TSH 2.85 01/09/2020    ROS-No hair or nail changes. No heat/cold intolerance. No constipation or diarrhea. Denies shakiness or anxiety.  A/P:***   #hypertension S: medication: metoprolol 25Mg , imdur 120Mg , amlodipine 10mg  Home readings #s: *** BP Readings from Last 3 Encounters:  11/21/20 114/70  03/06/20 128/64  03/05/20 138/82  A/P: ***  # GERD S:Medication: Nexium 40Mg  B12 levels related to PPI use: No results found for: VITAMINB12 A/P: ***      awv 01/09/20 courtney*** last cpe *** over 1 year  ***  2. Mild to moderate mitral valve regurgitation.  February 2021  ***Your cholesterol was above goal-are you missing any doses of the atorvastatin 20 mg?  If you are not missing any doses team please stop the atorvastatin 20 mg and start her on rosuvastatin 20 mg daily #90 with 3 refills  No problem-specific Assessment & Plan notes found for this encounter.   Recommended follow up: ***No follow-ups on file. Future Appointments  Date Time Provider Department Center  11/27/2020  8:40 AM , MD [facility]  12/03/2020  9:00 AM [facility]ENING [facility] None  12/05/2020  8:00 AM [facility] NM[facility]/TREAD [id number]  12/13/2020 10:00 AM 01/31/2021, MD [facility]    Lab/Order associations: No diagnosis found.  No orders of the defined types were placed in this encounter.   Time Spent: *** minutes of total time (9:08 PM***- 9:08 PM***) was spent on the date of the encounter performing the following actions: chart review prior to seeing the patient, obtaining history, performing a medically necessary exam, counseling on the treatment plan, placing orders, and documenting in our EHR.   Return precautions advised.  02/02/2021, CMA

## 2020-11-26 NOTE — Patient Instructions (Incomplete)
Health Maintenance Due  Topic Date Due  . COVID-19 Vaccine (1) Never done  . COLONOSCOPY (Pts 45-38yrs Insurance coverage will need to be confirmed)  Never done  . INFLUENZA VACCINE  05/26/2020   Depression screen [facility] 2/9 02/14/2020 01/09/2020 01/09/2020  Decreased Interest 0 0 0  Down, Depressed, Hopeless 0 2 2  PHQ - 2 Score 0 2 2  Altered sleeping 2 0 1  Tired, decreased energy 2 3 3   Change in appetite 0 0 0  Feeling bad or failure about yourself  0 0 0  Trouble concentrating 0 0 -  Moving slowly or fidgety/restless 0 0 0  Suicidal thoughts 0 0 0  PHQ-9 Score 4 5 6   Difficult doing work/chores Not difficult at all Not difficult at all Not difficult at all

## 2020-11-27 ENCOUNTER — Ambulatory Visit: Payer: Medicare Other | Admitting: Family Medicine

## 2020-11-27 DIAGNOSIS — K219 Gastro-esophageal reflux disease without esophagitis: Secondary | ICD-10-CM

## 2020-11-27 DIAGNOSIS — E0590 Thyrotoxicosis, unspecified without thyrotoxic crisis or storm: Secondary | ICD-10-CM

## 2020-11-27 DIAGNOSIS — I10 Essential (primary) hypertension: Secondary | ICD-10-CM

## 2020-11-29 ENCOUNTER — Encounter: Payer: Self-pay | Admitting: Family Medicine

## 2020-11-29 ENCOUNTER — Other Ambulatory Visit: Payer: Self-pay

## 2020-11-29 ENCOUNTER — Ambulatory Visit (INDEPENDENT_AMBULATORY_CARE_PROVIDER_SITE_OTHER): Payer: Medicare Other | Admitting: Family Medicine

## 2020-11-29 VITALS — BP 120/86 | HR 70 | Temp 97.8°F | Ht 66.0 in | Wt 158.2 lb

## 2020-11-29 DIAGNOSIS — R569 Unspecified convulsions: Secondary | ICD-10-CM

## 2020-11-29 DIAGNOSIS — E7800 Pure hypercholesterolemia, unspecified: Secondary | ICD-10-CM

## 2020-11-29 DIAGNOSIS — Z1211 Encounter for screening for malignant neoplasm of colon: Secondary | ICD-10-CM | POA: Diagnosis not present

## 2020-11-29 DIAGNOSIS — I700 Atherosclerosis of aorta: Secondary | ICD-10-CM

## 2020-11-29 DIAGNOSIS — I5020 Unspecified systolic (congestive) heart failure: Secondary | ICD-10-CM

## 2020-11-29 DIAGNOSIS — E0590 Thyrotoxicosis, unspecified without thyrotoxic crisis or storm: Secondary | ICD-10-CM | POA: Diagnosis not present

## 2020-11-29 DIAGNOSIS — G43109 Migraine with aura, not intractable, without status migrainosus: Secondary | ICD-10-CM | POA: Diagnosis not present

## 2020-11-29 DIAGNOSIS — I25119 Atherosclerotic heart disease of native coronary artery with unspecified angina pectoris: Secondary | ICD-10-CM | POA: Diagnosis not present

## 2020-11-29 DIAGNOSIS — I10 Essential (primary) hypertension: Secondary | ICD-10-CM | POA: Diagnosis not present

## 2020-11-29 DIAGNOSIS — N3281 Overactive bladder: Secondary | ICD-10-CM

## 2020-11-29 MED ORDER — POTASSIUM CHLORIDE ER 10 MEQ PO CPCR: 10.0000 meq | ORAL_CAPSULE | Freq: Two times a day (BID) | ORAL | 3 refills | Status: DC

## 2020-11-29 MED ORDER — TRAMADOL HCL 50 MG PO TABS: 50.0000 mg | ORAL_TABLET | Freq: Three times a day (TID) | ORAL | 3 refills | Status: DC | PRN

## 2020-11-29 NOTE — Addendum Note (Signed)
Addended by: Lurlean Horns on: 11/29/2020 03:03 PM   Modules accepted: Orders

## 2020-11-29 NOTE — Patient Instructions (Addendum)
Health Maintenance Due  Topic Date Due  . COVID-19 Vaccine (1) please call us with dates.  Never done  . COLONOSCOPY (Pts 45-22yrs Insurance coverage will need to be confirmed) - We will call you within two weeks about your referral to GI. If you do not hear within 2 weeks, give [place] a call.  Never done   Please stop by lab before you go If you have mychart- we will send your results within 3 business days of [place] receiving them.  If you do not have mychart- we will call you about results within 5 business days of [place] receiving them.  *please also note that you will see labs on mychart as soon as they post. I will later go in and write notes on them- will say "notes from Dr. Durene Cal"  Call to schedule yearly with Dr. Everardo All of endorcinology  Refill tramadol today- I would still check in with pain management provider as this is not as strong as prior medicine and I know you are having pain but usually in primary care I do not go beyond tramadol except in end of life care  Recommended follow up: Return in about 6 months (around 05/29/2021) for physical or sooner if needed.
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
## 2020-11-30 LAB — COMPREHENSIVE METABOLIC PANEL
AG Ratio: 1.3 (calc) (ref 1.0–2.5)
ALT: 15 U/L (ref 6–29)
AST: 21 U/L (ref 10–35)
Albumin: 4 g/dL (ref 3.6–5.1)
Alkaline phosphatase (APISO): 68 U/L (ref 37–153)
BUN/Creatinine Ratio: 13 (calc) (ref 6–22)
BUN: 14 mg/dL (ref 7–25)
CO2: 22 mmol/L (ref 20–32)
Calcium: 9.4 mg/dL (ref 8.6–10.4)
Chloride: 104 mmol/L (ref 98–110)
Creat: 1.08 mg/dL — ABNORMAL HIGH (ref 0.60–0.93)
Globulin: 3.2 g/dL (calc) (ref 1.9–3.7)
Glucose, Bld: 119 mg/dL — ABNORMAL HIGH (ref 65–99)
Potassium: 3.5 mmol/L (ref 3.5–5.3)
Sodium: 141 mmol/L (ref 135–146)
Total Bilirubin: 0.8 mg/dL (ref 0.2–1.2)
Total Protein: 7.2 g/dL (ref 6.1–8.1)

## 2020-11-30 LAB — CBC WITH DIFFERENTIAL/PLATELET
Absolute Monocytes: 373 cells/uL (ref 200–950)
Basophils Absolute: 32 cells/uL (ref 0–200)
Basophils Relative: 0.7 %
Eosinophils Absolute: 230 cells/uL (ref 15–500)
Eosinophils Relative: 5 %
HCT: 36.4 % (ref 35.0–45.0)
Hemoglobin: 12.5 g/dL (ref 11.7–15.5)
Lymphs Abs: 1895 cells/uL (ref 850–3900)
MCH: 32.8 pg (ref 27.0–33.0)
MCHC: 34.3 g/dL (ref 32.0–36.0)
MCV: 95.5 fL (ref 80.0–100.0)
MPV: 12 fL (ref 7.5–12.5)
Monocytes Relative: 8.1 %
Neutro Abs: 2070 cells/uL (ref 1500–7800)
Neutrophils Relative %: 45 %
Platelets: 233 10*3/uL (ref 140–400)
RBC: 3.81 10*6/uL (ref 3.80–5.10)
RDW: 13.1 % (ref 11.0–15.0)
Total Lymphocyte: 41.2 %
WBC: 4.6 10*3/uL (ref 3.8–10.8)

## 2020-11-30 LAB — LDL CHOLESTEROL, DIRECT: Direct LDL: 68 mg/dL (ref <100)

## 2020-11-30 LAB — TSH: TSH: 6.2 mIU/L — ABNORMAL HIGH (ref 0.40–4.50)

## 2020-12-02 ENCOUNTER — Telehealth (HOSPITAL_COMMUNITY): Payer: Self-pay | Admitting: *Deleted

## 2020-12-02 ENCOUNTER — Other Ambulatory Visit: Payer: Self-pay

## 2020-12-02 DIAGNOSIS — E0590 Thyrotoxicosis, unspecified without thyrotoxic crisis or storm: Secondary | ICD-10-CM

## 2020-12-02 NOTE — Telephone Encounter (Signed)
Patient given detailed instructions per Myocardial Perfusion Study Information Sheet for the test on 12/05/20. Patient notified to arrive 15 minutes early and that it is imperative to arrive on time for appointment to keep from having the test rescheduled.  If you need to cancel or reschedule your appointment, please call the office within 24 hours of your appointment. . Patient verbalized understanding. Sheyla Zaffino Jacqueline    

## 2020-12-03 ENCOUNTER — Other Ambulatory Visit (HOSPITAL_COMMUNITY)
Admission: RE | Admit: 2020-12-03 | Discharge: 2020-12-03 | Disposition: A | Discharge status: Home / Self Care | Payer: Medicare Other | Source: Ambulatory Visit | Attending: Physician Assistant | Admitting: Physician Assistant

## 2020-12-03 DIAGNOSIS — Z20822 Contact with and (suspected) exposure to covid-19: Secondary | ICD-10-CM | POA: Diagnosis not present

## 2020-12-03 DIAGNOSIS — Z01812 Encounter for preprocedural laboratory examination: Secondary | ICD-10-CM | POA: Diagnosis not present

## 2020-12-03 LAB — SARS CORONAVIRUS 2 (TAT 6-24 HRS): SARS Coronavirus 2: NEGATIVE

## 2020-12-05 ENCOUNTER — Ambulatory Visit (HOSPITAL_COMMUNITY): Payer: Medicare Other | Attending: Cardiology

## 2020-12-05 ENCOUNTER — Other Ambulatory Visit: Payer: Self-pay

## 2020-12-05 DIAGNOSIS — I456 Pre-excitation syndrome: Secondary | ICD-10-CM | POA: Diagnosis not present

## 2020-12-05 DIAGNOSIS — I2510 Atherosclerotic heart disease of native coronary artery without angina pectoris: Secondary | ICD-10-CM | POA: Insufficient documentation

## 2020-12-05 DIAGNOSIS — I10 Essential (primary) hypertension: Secondary | ICD-10-CM | POA: Diagnosis not present

## 2020-12-05 DIAGNOSIS — R079 Chest pain, unspecified: Secondary | ICD-10-CM | POA: Diagnosis not present

## 2020-12-05 LAB — MYOCARDIAL PERFUSION IMAGING
LV dias vol: 100 mL (ref 46–106)
LV sys vol: 39 mL
Peak HR: 99 {beats}/min
Rest HR: 73 {beats}/min
SDS: 3
SRS: 2
SSS: 5
TID: 1.05

## 2020-12-05 MED ORDER — REGADENOSON 0.4 MG/5ML IV SOLN
0.4000 mg | Freq: Once | INTRAVENOUS | Status: AC
Start: 2020-12-05 — End: 2020-12-05
  Administered 2020-12-05: 0.4 mg via INTRAVENOUS

## 2020-12-05 MED ORDER — TECHNETIUM TC 99M TETROFOSMIN IV KIT
32.7000 | PACK | Freq: Once | INTRAVENOUS | Status: AC | PRN
  Administered 2020-12-05: 32.7 via INTRAVENOUS
  Filled 2020-12-05: qty 33

## 2020-12-05 MED ORDER — TECHNETIUM TC 99M TETROFOSMIN IV KIT
10.4000 | PACK | Freq: Once | INTRAVENOUS | Status: AC | PRN
  Administered 2020-12-05: 10.4 via INTRAVENOUS
  Filled 2020-12-05: qty 11

## 2020-12-10 NOTE — Progress Notes (Signed)
Order(s) created erroneously. Erroneous order ID: [id number]  Order moved by: Kaelan Emami Y  Order move date/time: 12/10/2020 4:47 PM  Source Patient: [id number]  Source Contact: 11/29/2020  Destination Patient: [id number]  Destination Contact: 12/09/2020 

## 2020-12-10 NOTE — Progress Notes (Signed)
Order(s) created erroneously. Erroneous order ID: [id number]  Order moved by: Zianne Schubring Y  Order move date/time: 12/10/2020 4:44 PM  Source Patient: [id number]  Source Contact: 11/29/2020  Destination Patient: [id number]  Destination Contact: 12/09/2020 

## 2020-12-10 NOTE — Progress Notes (Signed)
Order(s) created erroneously. Erroneous order ID: [id number]  Order moved by: Maye Parkinson Y  Order move date/time: 12/10/2020 4:45 PM  Source Patient: [id number]  Source Contact: 11/29/2020  Destination Patient: [id number]  Destination Contact: 12/09/2020 

## 2020-12-10 NOTE — Progress Notes (Signed)
Order(s) created erroneously. Erroneous order ID: [id number]  Order moved by: Segundo Makela Y  Order move date/time: 12/10/2020 4:42 PM  Source Patient: [id number]  Source Contact: 11/29/2020  Destination Patient: [id number]  Destination Contact: 12/09/2020 

## 2020-12-13 ENCOUNTER — Institutional Professional Consult (permissible substitution): Payer: Medicare Other | Admitting: Internal Medicine

## 2020-12-16 ENCOUNTER — Encounter: Payer: Self-pay | Admitting: *Deleted

## 2020-12-24 ENCOUNTER — Ambulatory Visit: Payer: Medicare Other | Admitting: Student

## 2021-01-02 ENCOUNTER — Encounter: Payer: Self-pay | Admitting: Internal Medicine

## 2021-01-02 ENCOUNTER — Other Ambulatory Visit: Payer: Self-pay

## 2021-01-02 ENCOUNTER — Ambulatory Visit: Payer: Medicare Other | Admitting: Internal Medicine

## 2021-01-02 VITALS — BP 138/74 | HR 74 | Ht 66.0 in | Wt 162.0 lb

## 2021-01-02 DIAGNOSIS — I456 Pre-excitation syndrome: Secondary | ICD-10-CM | POA: Diagnosis not present

## 2021-01-02 MED ORDER — NITROGLYCERIN 0.4 MG SL SUBL: SUBLINGUAL_TABLET | SUBLINGUAL | 0 refills | Status: DC

## 2021-01-02 NOTE — Patient Instructions (Addendum)
Medication Instructions:  Your physician recommends that you continue on your current medications as directed. Please refer to the Current Medication list given to you today.  Labwork: None ordered.  Testing/Procedures: None ordered.  Follow-Up: Your physician wants you to follow-up in: As needed with  James Allred, MD    Any Other Special Instructions Will Be Listed Below (If Applicable).  If you need a refill on your cardiac medications before your next appointment, please call your pharmacy.        

## 2021-01-02 NOTE — Progress Notes (Signed)
Electrophysiology Office Note   Date:  01/02/2021   ID:  [name], DOB 1945/11/05, MRN [id number]  PCP:  Shelva Majestic, MD  Cardiologist:  Dr Katrinka Blazing Primary Electrophysiologist: Hillis Range, MD    CC: abnormal ekg   History of Present Illness: [name] is a 75 y.o. female who presents today for electrophysiology evaluation.   She is referred by Dr Katrinka Blazing and Shellia Cleverly for EP consultation regarding possible pre-excitation. The patient has a h/o coronary vasospasm.  She did well until developing COVID in 2020.  She had reduced EF.  This has subsequently normalized.  She has occasional CP and SOB.  This has improved with medical therapy for vasospasm. She denies sustained palpitations or documented arrhythmias.  She previously wore telemetry during which she had only NSVT.  She did not have arrhythmias to correlate with her symptoms at that time.  Today, she denies symptoms of palpitations, chest pain, shortness of breath, orthopnea, PND, lower extremity edema, claudication, dizziness, presyncope, syncope, bleeding, or neurologic sequela. The patient is tolerating medications without difficulties and is otherwise without complaint today.    Past Medical History:  Diagnosis Date  . Anemia 12/22/2017  . Arthritis   . Chronic lower back pain   . Cluster headache   . Coronary artery disease    a. 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 non obs dz LHC 2007. c. Non obs dz (mild-mod) by [facility] 05/17/14 d. cath 05/26/17 mild non obstructive CAD  . COVID-19 08/07/2019  . Diverticulitis    a. Hx microperf 2012 - hospitalizated.  Marland Kitchen GERD (gastroesophageal reflux disease)   . Hypercholesteremia   . Hypertension   . Overactive bladder 09/19/2014   Oxybutynin 5mg  XL--> 10mg   . PVC's (premature ventricular contractions)    a. Hx of trigeminy/bigeminy.  . Seizures (HCC)   . Thyroid disease    hyperthyroid  . UTI (lower urinary tract infection)    Past Surgical History:  Procedure Laterality Date   . ABDOMINAL HYSTERECTOMY  1987   "partial"-still has ovaries  . CARDIAC CATHETERIZATION  05/17/2014  . CORONARY ANGIOPLASTY WITH STENT PLACEMENT  1995   "1"  . JOINT REPLACEMENT     right knee  . LEFT HEART CATH AND CORONARY ANGIOGRAPHY N/A 05/26/2017   Procedure: Left Heart Cath and Coronary Angiography;  Surgeon: 05/19/2014, MD;  Location: [facility] INVASIVE CV LAB;  Service: Cardiovascular;  Laterality: N/A;  . LEFT HEART CATHETERIZATION WITH CORONARY ANGIOGRAM N/A 05/17/2014   Procedure: LEFT HEART CATHETERIZATION WITH CORONARY ANGIOGRAM;  Surgeon: [facility], MD;  Location: [facility] CATH LAB;  Service: Cardiovascular;  Laterality: N/A;  . TOTAL KNEE ARTHROPLASTY Right 2005  . TUBAL LIGATION  1970  . VASCULAR SURGERY       Current Outpatient Medications  Medication Sig Dispense Refill  . Aloe Vera 25 MG CAPS Take by mouth.    [facility] amLODipine (NORVASC) 10 MG tablet TAKE 1 TABLET(10 MG) BY MOUTH DAILY 90 tablet 3  . ASPIRIN LOW DOSE PO Take 81 mg by mouth daily.    2006 atorvastatin (LIPITOR) 40 MG tablet Take 1 tablet (40 mg total) by mouth daily. 90 tablet 3  . Cholecalciferol (VITAMIN D3) 25 MCG (1000 UT) CAPS Take by mouth.    . Cobalamin Combinations (NEURIVA PLUS) CAPS Take by mouth.    . Cyanocobalamin (VITAMIN B-12) 3000 MCG SUBL Place 3,000 mg under the tongue daily.    Marland Kitchen esomeprazole (NEXIUM) 40 MG capsule TAKE 1 CAPSULE(40 MG) BY  MOUTH DAILY 90 capsule 2  . ezetimibe (ZETIA) 10 MG tablet TAKE 1 TABLET(10 MG) BY MOUTH DAILY 90 tablet 2  . furosemide (LASIX) 20 MG tablet Take 1 tablet (20 mg total) by mouth daily. 90 tablet 3  . isosorbide mononitrate (IMDUR) 120 MG 24 hr tablet TAKE 1 TABLET(120 MG) BY MOUTH DAILY 90 tablet 3  . methimazole (TAPAZOLE) 10 MG tablet Take 3 tablets (30 mg total) by mouth daily. 360 tablet 1  . metoprolol succinate (TOPROL-XL) 25 MG 24 hr tablet Take 1 tablet (25 mg total) by mouth daily. Please call the office and schedule a follow up appointment 90  tablet 1  . nitroGLYCERIN (NITROSTAT) 0.4 MG SL tablet PLACE 1 TABLET UNDER THE TONGUE EVERY 5 MINUTES FOR CHEST PAIN FOR 3 DOSES 25 tablet 0  . oxybutynin (DITROPAN-XL) 10 MG 24 hr tablet TAKE 1 TABLET BY MOUTH EVERY DAY 90 tablet 3  . potassium chloride (MICRO-K) 10 MEQ CR capsule Take 1 capsule (10 mEq total) by mouth 2 (two) times daily. 180 capsule 3  . Prenatal Vit-Fe Fumarate-FA (PRENATAL MULTIVITAMIN) TABS tablet Take 1 tablet by mouth daily at 12 noon.    . traMADol (ULTRAM) 50 MG tablet Take 1 tablet (50 mg total) by mouth every 8 (eight) hours as needed for moderate pain or severe pain (back pain, migraines. chronic rx.). 90 tablet 3  . venlafaxine XR (EFFEXOR-XR) 150 MG 24 hr capsule TAKE ONE CAPSULE BY MOUTH AT BEDTIME- NEED VISIT 30 capsule 5  . vitamin C (VITAMIN C) 500 MG tablet Take 1 tablet (500 mg total) by mouth 2 (two) times daily. 14 tablet 0  . Vitamin E 180 MG CAPS Take 2 capsules by mouth daily.    Marland Kitchen zinc sulfate 220 (50 Zn) MG capsule Take 1 capsule (220 mg total) by mouth daily. 7 capsule 0   No current facility-administered medications for this visit.    Allergies:   Crestor [rosuvastatin] and Sulfa drugs cross reactors   Social History:  The patient  reports that she has never smoked. She has never used smokeless tobacco. She reports that she does not drink alcohol and does not use drugs.   Family History:  The patient's  family history includes CAD in her brother and father; Diabetes in her brother, father, mother, sister, and sister; Heart attack in her brother; Prostate cancer in her brother; Thyroid disease in her sister and sister.    ROS:  Please see the history of present illness.   All other systems are personally reviewed and negative.    PHYSICAL EXAM: VS:  BP 138/74   Pulse 74   Ht 5\' 6"  (1.676 m)   Wt 162 lb (73.5 kg)   SpO2 98%   BMI 26.15 kg/m  , BMI Body mass index is 26.15 kg/m. GEN: Well nourished, well developed, in no acute  distress HEENT: normal Neck: no JVD,  Cardiac: RRR  Respiratory:    normal work of breathing GI: soft,  MS: no deformity or atrophy Skin: warm and dry  Neuro:  Strength and sensation are intact Psych: euthymic mood, full affect  EKG:  EKG is ordered today. The ekg ordered today is personally reviewed and shows sinus rhythm with short PR, no clear pre-excitation,  PVCs   Recent Labs: 02/03/2020: Magnesium 1.8 11/29/2020: ALT 15; BUN 14; Creat 1.08; Hemoglobin 12.5; Platelets 233; Potassium 3.5; Sodium 141; TSH 6.20  personally reviewed   Lipid Panel     Component Value Date/Time  CHOL 164 01/09/2020 1331   TRIG 110.0 01/09/2020 1331   HDL 58.10 01/09/2020 1331   CHOLHDL 3 01/09/2020 1331   VLDL 22.0 01/09/2020 1331   LDLCALC 84 01/09/2020 1331   LDLDIRECT 68 11/29/2020 1431   personally reviewed   Wt Readings from Last 3 Encounters:  01/02/21 162 lb (73.5 kg)  12/05/20 158 lb (71.7 kg)  11/29/20 158 lb 3.2 oz (71.8 kg)      Other studies personally reviewed: Additional studies/ records that were reviewed today include: prior office notes, echo, myoview, event monitor  Review of the above records today demonstrates: as above   ASSESSMENT AND PLAN:  1.  Pre-excitation She has short PR with some hints towards but not clear cut pre-excitation. I am not convinced that she has had WPW syndrome (which requires SVT and symptoms).  She does not have documented SVT.  Her symptoms on prior event monitor did not correspond to an arrhythmia.  I did offer EP study today which she has declined.  I think that this is a reasonable approach.  If she develops documented SVT with associated symptoms them we could reconsider.  Follow-up with Dr Katrinka Blazing I will see as needed    Signed, Hillis Range, MD  01/02/2021 10:57 AM     [facility] [address] [place] [address] [phone] (office) [phone] (fax)

## 2021-01-08 ENCOUNTER — Other Ambulatory Visit: Payer: Self-pay

## 2021-01-08 ENCOUNTER — Ambulatory Visit (INDEPENDENT_AMBULATORY_CARE_PROVIDER_SITE_OTHER): Payer: Medicare Other | Admitting: Endocrinology

## 2021-01-08 DIAGNOSIS — E0590 Thyrotoxicosis, unspecified without thyrotoxic crisis or storm: Secondary | ICD-10-CM | POA: Diagnosis not present

## 2021-01-08 MED ORDER — METHIMAZOLE 10 MG PO TABS: 20.0000 mg | ORAL_TABLET | Freq: Every day | ORAL | 1 refills | Status: DC

## 2021-01-08 NOTE — Progress Notes (Signed)
Subjective:    Patient ID: [name], female    DOB: 09-14-1946, 75 y.o.   MRN: [id number]  HPI Pt returns for f/u of hyperthyroidism (dx'ed 2012; [place] was c/w Grave's Dz; tapazole was chosen as rx, due to 2018 MI).  She takes tapazole, 30 mg QD.  She says she seldom misses.  pt states she feels well in general, except for muscle cramps.   Past Medical History:  Diagnosis Date  . Anemia 12/22/2017  . Arthritis   . Chronic lower back pain   . Cluster headache   . Coronary artery disease    a. 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 non obs dz LHC 2007. c. Non obs dz (mild-mod) by [facility] 05/17/14 d. cath 05/26/17 mild non obstructive CAD  . COVID-19 08/07/2019  . Diverticulitis    a. Hx microperf 2012 - hospitalizated.  Marland Kitchen GERD (gastroesophageal reflux disease)   . Hypercholesteremia   . Hypertension   . Overactive bladder 09/19/2014   Oxybutynin 5mg  XL--> 10mg   . PVC's (premature ventricular contractions)    a. Hx of trigeminy/bigeminy.  . Seizures (HCC)   . Thyroid disease    hyperthyroid  . UTI (lower urinary tract infection)     Past Surgical History:  Procedure Laterality Date  . ABDOMINAL HYSTERECTOMY  1987   "partial"-still has ovaries  . CARDIAC CATHETERIZATION  05/17/2014  . CORONARY ANGIOPLASTY WITH STENT PLACEMENT  1995   "1"  . JOINT REPLACEMENT     right knee  . LEFT HEART CATH AND CORONARY ANGIOGRAPHY N/A 05/26/2017   Procedure: Left Heart Cath and Coronary Angiography;  Surgeon: 05/19/2014, MD;  Location: [facility] INVASIVE CV LAB;  Service: Cardiovascular;  Laterality: N/A;  . LEFT HEART CATHETERIZATION WITH CORONARY ANGIOGRAM N/A 05/17/2014   Procedure: LEFT HEART CATHETERIZATION WITH CORONARY ANGIOGRAM;  Surgeon: [facility], MD;  Location: [facility] CATH LAB;  Service: Cardiovascular;  Laterality: N/A;  . TOTAL KNEE ARTHROPLASTY Right 2005  . TUBAL LIGATION  1970  . VASCULAR SURGERY      Social History   Socioeconomic History  . Marital status: Married    Spouse name: Not  on file  . Number of children: 4  . Years of education: 4  . Highest education level: Not on file  Occupational History    Comment: retired  Tobacco Use  . Smoking status: Never Smoker  . Smokeless tobacco: Never Used  Vaping Use  . Vaping Use: Never used  Substance and Sexual Activity  . Alcohol use: No  . Drug use: No  . Sexual activity: Never  Other Topics Concern  . Not on file  Social History Narrative   Separated. 4 children from first marriage. 16 grandkids.       Retired from [employer] for 25 years, went to [place] for 5 years. Retired 2003 after MI      Hobbies: babysit/active with children      Right-handed      Caffeine: 24 oz soda per day      Social Determinants of Health   Financial Resource Strain: Not on file  Food Insecurity: Not on file  Transportation Needs: Not on file  Physical Activity: Not on file  Stress: Not on file  Social Connections: Not on file  Intimate Partner Violence: Not on file    Current Outpatient Medications on File Prior to Visit  Medication Sig Dispense Refill  . Aloe Vera 25 MG CAPS Take by mouth.    [employer] amLODipine (  NORVASC) 10 MG tablet TAKE 1 TABLET(10 MG) BY MOUTH DAILY 90 tablet 3  . ASPIRIN LOW DOSE PO Take 81 mg by mouth daily.    Marland Kitchen atorvastatin (LIPITOR) 40 MG tablet Take 1 tablet (40 mg total) by mouth daily. 90 tablet 3  . Cholecalciferol (VITAMIN D3) 25 MCG (1000 UT) CAPS Take by mouth.    . Cobalamin Combinations (NEURIVA PLUS) CAPS Take by mouth.    . Cyanocobalamin (VITAMIN B-12) 3000 MCG SUBL Place 3,000 mg under the tongue daily.    Marland Kitchen esomeprazole (NEXIUM) 40 MG capsule TAKE 1 CAPSULE(40 MG) BY MOUTH DAILY 90 capsule 2  . ezetimibe (ZETIA) 10 MG tablet TAKE 1 TABLET(10 MG) BY MOUTH DAILY 90 tablet 2  . furosemide (LASIX) 20 MG tablet Take 1 tablet (20 mg total) by mouth daily. 90 tablet 3  . isosorbide mononitrate (IMDUR) 120 MG 24 hr tablet TAKE 1 TABLET(120 MG) BY MOUTH DAILY 90 tablet 3  . metoprolol succinate  (TOPROL-XL) 25 MG 24 hr tablet Take 1 tablet (25 mg total) by mouth daily. Please call the office and schedule a follow up appointment 90 tablet 1  . nitroGLYCERIN (NITROSTAT) 0.4 MG SL tablet PLACE 1 TABLET UNDER THE TONGUE EVERY 5 MINUTES FOR CHEST PAIN FOR 3 DOSES 25 tablet 0  . oxybutynin (DITROPAN-XL) 10 MG 24 hr tablet TAKE 1 TABLET BY MOUTH EVERY DAY 90 tablet 3  . potassium chloride (MICRO-K) 10 MEQ CR capsule Take 1 capsule (10 mEq total) by mouth 2 (two) times daily. 180 capsule 3  . Prenatal Vit-Fe Fumarate-FA (PRENATAL MULTIVITAMIN) TABS tablet Take 1 tablet by mouth daily at 12 noon.    . traMADol (ULTRAM) 50 MG tablet Take 1 tablet (50 mg total) by mouth every 8 (eight) hours as needed for moderate pain or severe pain (back pain, migraines. chronic rx.). 90 tablet 3  . venlafaxine XR (EFFEXOR-XR) 150 MG 24 hr capsule TAKE ONE CAPSULE BY MOUTH AT BEDTIME- NEED VISIT 30 capsule 5  . vitamin C (VITAMIN C) 500 MG tablet Take 1 tablet (500 mg total) by mouth 2 (two) times daily. 14 tablet 0  . Vitamin E 180 MG CAPS Take 2 capsules by mouth daily.    Marland Kitchen zinc sulfate 220 (50 Zn) MG capsule Take 1 capsule (220 mg total) by mouth daily. 7 capsule 0   No current facility-administered medications on file prior to visit.    Allergies  Allergen Reactions  . Crestor [Rosuvastatin] Other (See Comments)    Hot flashes and severe cramps  . Sulfa Drugs Cross Reactors Shortness Of Breath and Palpitations    Family History  Problem Relation Age of Onset  . CAD Brother   . Diabetes Brother   . CAD Father   . Diabetes Father   . Diabetes Mother        father, sister, brothers  . Diabetes Sister   . Thyroid disease Sister   . Heart attack Brother   . Prostate cancer Brother   . Thyroid disease Sister   . Diabetes Sister     BP 140/64 (BP Location: Right Arm, Patient Position: Sitting, Cuff Size: Normal)   Pulse 81   Ht 5\' 6"  (1.676 m)   Wt 158 lb 9.6 oz (71.9 kg)   SpO2 97%   BMI  25.60 kg/m    Review of Systems Denies fever    Objective:   Physical Exam VITAL SIGNS:  See vs page GENERAL: no distress NECK: thyroid is slightly  and diffusely enlarged.  No palpable nodule.    Lab Results  Component Value Date   TSH 6.20 (H) 11/29/2020      Assessment & Plan:  Hyperthyroidism: overcontrolled.    Patient Instructions  Please reduce the methimazole to 2 pills per day. If ever you have fever while taking methimazole, stop it and call us, even if the reason is obvious, because of the risk of a rare side-effect. It is best to never miss the medication.  However, if you do miss it, next best is to double up the next time.   Please come back for a follow-up appointment in 2 months.

## 2021-01-08 NOTE — Patient Instructions (Addendum)
Please reduce the methimazole to 2 pills per day. If ever you have fever while taking methimazole, stop it and call us, even if the reason is obvious, because of the risk of a rare side-effect. It is best to never miss the medication.  However, if you do miss it, next best is to double up the next time.   Please come back for a follow-up appointment in 2 months.

## 2021-01-31 ENCOUNTER — Other Ambulatory Visit: Payer: Self-pay | Admitting: *Deleted

## 2021-01-31 MED ORDER — FUROSEMIDE 20 MG PO TABS: 20.0000 mg | ORAL_TABLET | Freq: Every day | ORAL | 3 refills | Status: DC

## 2021-02-06 ENCOUNTER — Ambulatory Visit (INDEPENDENT_AMBULATORY_CARE_PROVIDER_SITE_OTHER): Payer: Medicare Other

## 2021-02-06 ENCOUNTER — Other Ambulatory Visit: Payer: Self-pay

## 2021-02-06 VITALS — BP 136/78 | HR 77 | Temp 98.1°F | Resp 20 | Wt 158.0 lb

## 2021-02-06 DIAGNOSIS — E2839 Other primary ovarian failure: Secondary | ICD-10-CM | POA: Diagnosis not present

## 2021-02-06 DIAGNOSIS — Z1211 Encounter for screening for malignant neoplasm of colon: Secondary | ICD-10-CM

## 2021-02-06 DIAGNOSIS — Z0000 Encounter for general adult medical examination without abnormal findings: Secondary | ICD-10-CM | POA: Diagnosis not present

## 2021-02-06 NOTE — Patient Instructions (Signed)
[name] , Thank you for taking time to come for your Medicare Wellness Visit. I appreciate your ongoing commitment to your health goals. Please review the following plan we discussed and let me know if I can assist you in the future.   Screening recommendations/referrals: Colonoscopy: Order placed 02/06/21 Mammogram: Done 02/22/20 Bone Density: Order placed 02/06/21 Recommended yearly ophthalmology/optometry visit for glaucoma screening and checkup Recommended yearly dental visit for hygiene and checkup  Vaccinations: Influenza vaccine: Due and discussed Pneumococcal vaccine: Up to date Tdap vaccine: Due and discussed Shingles vaccine: Shingrix discussed. Please contact your pharmacy for coverage information.    Covid-19:Pt will call back with date   Advanced directives: Advance directive discussed with you today. I have provided a copy for you to complete at home and have notarized. Once this is complete please bring a copy in to our office so we can scan it into your chart.  Conditions/risks identified: Exercise   Next appointment: Follow up in one year for your annual wellness visit    Preventive Care 65 Years and Older, Female Preventive care refers to lifestyle choices and visits with your health care provider that can promote health and wellness. What does preventive care include?  A yearly physical exam. This is also called an annual well check.  Dental exams once or twice a year.  Routine eye exams. Ask your health care provider how often you should have your eyes checked.  Personal lifestyle choices, including:  Daily care of your teeth and gums.  Regular physical activity.  Eating a healthy diet.  Avoiding tobacco and drug use.  Limiting alcohol use.  Practicing safe sex.  Taking low-dose aspirin every day.  Taking vitamin and mineral supplements as recommended by your health care provider. What happens during an annual well check? The services and  screenings done by your health care provider during your annual well check will depend on your age, overall health, lifestyle risk factors, and family history of disease. Counseling  Your health care provider may ask you questions about your:  Alcohol use.  Tobacco use.  Drug use.  Emotional well-being.  Home and relationship well-being.  Sexual activity.  Eating habits.  History of falls.  Memory and ability to understand (cognition).  Work and work Astronomer.  Reproductive health. Screening  You may have the following tests or measurements:  Height, weight, and BMI.  Blood pressure.  Lipid and cholesterol levels. These may be checked every 5 years, or more frequently if you are over 5 years old.  Skin check.  Lung cancer screening. You may have this screening every year starting at age 74 if you have a 30-pack-year history of smoking and currently smoke or have quit within the past 15 years.  Fecal occult blood test (FOBT) of the stool. You may have this test every year starting at age 74.  Flexible sigmoidoscopy or colonoscopy. You may have a sigmoidoscopy every 5 years or a colonoscopy every 10 years starting at age 74.  Hepatitis C blood test.  Hepatitis B blood test.  Sexually transmitted disease (STD) testing.  Diabetes screening. This is done by checking your blood sugar (glucose) after you have not eaten for a while (fasting). You may have this done every 1-3 years.  Bone density scan. This is done to screen for osteoporosis. You may have this done starting at age 74.  Mammogram. This may be done every 1-2 years. Talk to your health care provider about how often you should have  regular mammograms. Talk with your health care provider about your test results, treatment options, and if necessary, the need for more tests. Vaccines  Your health care provider may recommend certain vaccines, such as:  Influenza vaccine. This is recommended every  year.  Tetanus, diphtheria, and acellular pertussis (Tdap, Td) vaccine. You may need a Td booster every 10 years.  Zoster vaccine. You may need this after age 74.  Pneumococcal 13-valent conjugate (PCV13) vaccine. One dose is recommended after age 74.  Pneumococcal polysaccharide (PPSV23) vaccine. One dose is recommended after age 74. Talk to your health care provider about which screenings and vaccines you need and how often you need them. This information is not intended to replace advice given to you by your health care provider. Make sure you discuss any questions you have with your health care provider. Document Released: 11/08/2015 Document Revised: 07/01/2016 Document Reviewed: 08/13/2015 Elsevier Interactive Patient Education  2017 [place] Prevention in the Home Falls can cause injuries. They can happen to people of all ages. There are many things you can do to make your home safe and to help prevent falls. What can I do on the outside of my home?  Regularly fix the edges of walkways and driveways and fix any cracks.  Remove anything that might make you trip as you walk through a door, such as a raised step or threshold.  Trim any bushes or trees on the path to your home.  Use bright outdoor lighting.  Clear any walking paths of anything that might make someone trip, such as rocks or tools.  Regularly check to see if handrails are loose or broken. Make sure that both sides of any steps have handrails.  Any raised decks and porches should have guardrails on the edges.  Have any leaves, snow, or ice cleared regularly.  Use sand or salt on walking paths during winter.  Clean up any spills in your garage right away. This includes oil or grease spills. What can I do in the bathroom?  Use night lights.  Install grab bars by the toilet and in the tub and shower. Do not use towel bars as grab bars.  Use non-skid mats or decals in the tub or shower.  If you  need to sit down in the shower, use a plastic, non-slip stool.  Keep the floor dry. Clean up any water that spills on the floor as soon as it happens.  Remove soap buildup in the tub or shower regularly.  Attach bath mats securely with double-sided non-slip rug tape.  Do not have throw rugs and other things on the floor that can make you trip. What can I do in the bedroom?  Use night lights.  Make sure that you have a light by your bed that is easy to reach.  Do not use any sheets or blankets that are too big for your bed. They should not hang down onto the floor.  Have a firm chair that has side arms. You can use this for support while you get dressed.  Do not have throw rugs and other things on the floor that can make you trip. What can I do in the kitchen?  Clean up any spills right away.  Avoid walking on wet floors.  Keep items that you use a lot in easy-to-reach places.  If you need to reach something above you, use a strong step stool that has a grab bar.  Keep electrical cords out of the  way.  Do not use floor polish or wax that makes floors slippery. If you must use wax, use non-skid floor wax.  Do not have throw rugs and other things on the floor that can make you trip. What can I do with my stairs?  Do not leave any items on the stairs.  Make sure that there are handrails on both sides of the stairs and use them. Fix handrails that are broken or loose. Make sure that handrails are as long as the stairways.  Check any carpeting to make sure that it is firmly attached to the stairs. Fix any carpet that is loose or worn.  Avoid having throw rugs at the top or bottom of the stairs. If you do have throw rugs, attach them to the floor with carpet tape.  Make sure that you have a light switch at the top of the stairs and the bottom of the stairs. If you do not have them, ask someone to add them for you. What else can I do to help prevent falls?  Wear shoes  that:  Do not have high heels.  Have rubber bottoms.  Are comfortable and fit you well.  Are closed at the toe. Do not wear sandals.  If you use a stepladder:  Make sure that it is fully opened. Do not climb a closed stepladder.  Make sure that both sides of the stepladder are locked into place.  Ask someone to hold it for you, if possible.  Clearly mark and make sure that you can see:  Any grab bars or handrails.  First and last steps.  Where the edge of each step is.  Use tools that help you move around (mobility aids) if they are needed. These include:  Canes.  Walkers.  Scooters.  Crutches.  Turn on the lights when you go into a dark area. Replace any light bulbs as soon as they burn out.  Set up your furniture so you have a clear path. Avoid moving your furniture around.  If any of your floors are uneven, fix them.  If there are any pets around you, be aware of where they are.  Review your medicines with your doctor. Some medicines can make you feel dizzy. This can increase your chance of falling. Ask your doctor what other things that you can do to help prevent falls. This information is not intended to replace advice given to you by your health care provider. Make sure you discuss any questions you have with your health care provider. Document Released: 08/08/2009 Document Revised: 03/19/2016 Document Reviewed: 11/16/2014 Elsevier Interactive Patient Education  2017 [employer].

## 2021-02-06 NOTE — Progress Notes (Addendum)
Subjective:   [name] is a 75 y.o. female who presents for Medicare Annual (Subsequent) preventive examination.  Review of Systems     Cardiac Risk Factors include: advanced age (>8355men, 53>65 women);dyslipidemia;hypertension     Objective:    Today's Vitals   02/06/21 1358  BP: 136/78  Pulse: 77  Resp: 20  Temp: 98.1 F (36.7 C)  SpO2: 96%  Weight: 158 lb (71.7 kg)   Body mass index is 25.5 kg/m.  Advanced Directives 02/06/2021 02/03/2020 02/03/2020 01/09/2020 08/05/2019 12/01/2018 07/10/2018  Does Patient Have a Medical Advance Directive? No No No No No No No  Would patient like information on creating a medical advance directive? Yes (MAU/Ambulatory/Procedural Areas - Information given) - No - Patient declined;Yes (ED - Information included in AVS) Yes (MAU/Ambulatory/Procedural Areas - Information given) Yes (ED - Information included in AVS) No - Patient declined No - Patient declined  Pre-existing out of facility DNR order (yellow form or pink MOST form) - - - - - - -    Current Medications (verified) Outpatient Encounter Medications as of 02/06/2021  Medication Sig  . Aloe Vera 25 MG CAPS Take by mouth.  Marland Kitchen. amLODipine (NORVASC) 10 MG tablet TAKE 1 TABLET(10 MG) BY MOUTH DAILY  . atorvastatin (LIPITOR) 40 MG tablet Take 1 tablet (40 mg total) by mouth daily.  . Cholecalciferol (VITAMIN D3) 25 MCG (1000 UT) CAPS Take by mouth.  . Cyanocobalamin (VITAMIN B-12) 3000 MCG SUBL Place 3,000 mg under the tongue daily.  Marland Kitchen. esomeprazole (NEXIUM) 40 MG capsule TAKE 1 CAPSULE(40 MG) BY MOUTH DAILY  . ezetimibe (ZETIA) 10 MG tablet TAKE 1 TABLET(10 MG) BY MOUTH DAILY  . furosemide (LASIX) 20 MG tablet Take 1 tablet (20 mg total) by mouth daily.  . isosorbide mononitrate (IMDUR) 120 MG 24 hr tablet TAKE 1 TABLET(120 MG) BY MOUTH DAILY  . methimazole (TAPAZOLE) 10 MG tablet Take 2 tablets (20 mg total) by mouth daily.  . metoprolol succinate (TOPROL-XL) 25 MG 24 hr tablet Take 1 tablet  (25 mg total) by mouth daily. Please call the office and schedule a follow up appointment  . nitroGLYCERIN (NITROSTAT) 0.4 MG SL tablet PLACE 1 TABLET UNDER THE TONGUE EVERY 5 MINUTES FOR CHEST PAIN FOR 3 DOSES  . oxybutynin (DITROPAN-XL) 10 MG 24 hr tablet TAKE 1 TABLET BY MOUTH EVERY DAY  . potassium chloride (MICRO-K) 10 MEQ CR capsule Take 1 capsule (10 mEq total) by mouth 2 (two) times daily.  . Prenatal Vit-Fe Fumarate-FA (PRENATAL MULTIVITAMIN) TABS tablet Take 1 tablet by mouth daily at 12 noon.  . venlafaxine XR (EFFEXOR-XR) 150 MG 24 hr capsule TAKE ONE CAPSULE BY MOUTH AT BEDTIME- NEED VISIT  . vitamin C (VITAMIN C) 500 MG tablet Take 1 tablet (500 mg total) by mouth 2 (two) times daily.  . Vitamin E 180 MG CAPS Take 2 capsules by mouth daily.  Marland Kitchen. zinc sulfate 220 (50 Zn) MG capsule Take 1 capsule (220 mg total) by mouth daily.  . ASPIRIN LOW DOSE PO Take 81 mg by mouth daily.  . Cobalamin Combinations (NEURIVA PLUS) CAPS Take by mouth. (Patient not taking: Reported on 02/06/2021)  . traMADol (ULTRAM) 50 MG tablet Take 1 tablet (50 mg total) by mouth every 8 (eight) hours as needed for moderate pain or severe pain (back pain, migraines. chronic rx.).   No facility-administered encounter medications on file as of 02/06/2021.    Allergies (verified) Crestor [rosuvastatin] and Sulfa drugs cross reactors  History: Past Medical History:  Diagnosis Date  . Anemia 12/22/2017  . Arthritis   . Chronic lower back pain   . Cluster headache   . Coronary artery disease    a. 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 non obs dz LHC 2007. c. Non obs dz (mild-mod) by [facility] 05/17/14 d. cath 05/26/17 mild non obstructive CAD  . COVID-19 08/07/2019  . Diverticulitis    a. Hx microperf 2012 - hospitalizated.  Marland Kitchen GERD (gastroesophageal reflux disease)   . Hypercholesteremia   . Hypertension   . Overactive bladder 09/19/2014   Oxybutynin 5mg  XL--> 10mg   . PVC's (premature ventricular contractions)    a. Hx of  trigeminy/bigeminy.  . Seizures (HCC)   . Thyroid disease    hyperthyroid  . UTI (lower urinary tract infection)    Past Surgical History:  Procedure Laterality Date  . ABDOMINAL HYSTERECTOMY  1987   "partial"-still has ovaries  . CARDIAC CATHETERIZATION  05/17/2014  . CORONARY ANGIOPLASTY WITH STENT PLACEMENT  1995   "1"  . JOINT REPLACEMENT     right knee  . LEFT HEART CATH AND CORONARY ANGIOGRAPHY N/A 05/26/2017   Procedure: Left Heart Cath and Coronary Angiography;  Surgeon: 05/19/2014, MD;  Location: [facility] INVASIVE CV LAB;  Service: Cardiovascular;  Laterality: N/A;  . LEFT HEART CATHETERIZATION WITH CORONARY ANGIOGRAM N/A 05/17/2014   Procedure: LEFT HEART CATHETERIZATION WITH CORONARY ANGIOGRAM;  Surgeon: [facility], MD;  Location: [facility] CATH LAB;  Service: Cardiovascular;  Laterality: N/A;  . TOTAL KNEE ARTHROPLASTY Right 2005  . TUBAL LIGATION  1970  . VASCULAR SURGERY     Family History  Problem Relation Age of Onset  . CAD Brother   . Diabetes Brother   . CAD Father   . Diabetes Father   . Diabetes Mother        father, sister, brothers  . Diabetes Sister   . Thyroid disease Sister   . Heart attack Brother   . Prostate cancer Brother   . Thyroid disease Sister   . Diabetes Sister    Social History   Socioeconomic History  . Marital status: Married    Spouse name: Not on file  . Number of children: 4  . Years of education: 4  . Highest education level: Not on file  Occupational History    Comment: retired  Tobacco Use  . Smoking status: Never Smoker  . Smokeless tobacco: Never Used  Vaping Use  . Vaping Use: Never used  Substance and Sexual Activity  . Alcohol use: No  . Drug use: No  . Sexual activity: Never  Other Topics Concern  . Not on file  Social History Narrative   Separated. 4 children from first marriage. 16 grandkids.       Retired from [employer] for 25 years, went to 2006 for 5 years. Retired 2003 after MI      Hobbies:  babysit/active with children      Right-handed      Caffeine: 24 oz soda per day      Social Determinants of Health   Financial Resource Strain: Low Risk   . Difficulty of Paying Living Expenses: Not hard at all  Food Insecurity: No Food Insecurity  . Worried About [employer] in the Last Year: Never true  . Ran Out of Food in the Last Year: Never true  Transportation Needs: No Transportation Needs  . Lack of Transportation (Medical): No  . Lack of Transportation (  Non-Medical): No  Physical Activity: Inactive  . Days of Exercise per Week: 0 days  . Minutes of Exercise per Session: 0 min  Stress: No Stress Concern Present  . Feeling of Stress : Not at all  Social Connections: Moderately Isolated  . Frequency of Communication with Friends and Family: Three times a week  . Frequency of Social Gatherings with Friends and Family: Never  . Attends Religious Services: More than 4 times per year  . Active Member of Clubs or Organizations: No  . Attends Banker Meetings: Never  . Marital Status: Widowed    Tobacco Counseling Counseling given: Not Answered   Clinical Intake:  Pre-visit preparation completed: Yes  Pain : No/denies pain     BMI - recorded: 25.5 Nutritional Status: BMI 25 -29 Overweight Nutritional Risks: None Diabetes: No  How often do you need to have someone help you when you read instructions, pamphlets, or other written materials from your doctor or pharmacy?: 1 - Never  Diabetic?No  Interpreter Needed?: No  Information entered by :: Lanier Ensign, LPN   Activities of Daily Living In your present state of health, do you have any difficulty performing the following activities: 02/06/2021  Hearing? Y  Comment mild loss  Vision? N  Difficulty concentrating or making decisions? N  Walking or climbing stairs? N  Dressing or bathing? N  Doing errands, shopping? N  Preparing Food and eating ? N  Using the Toilet? N  In the  past six months, have you accidently leaked urine? Y  Comment wears and pads and brief  Do you have problems with loss of bowel control? N  Managing your Medications? N  Managing your Finances? N  Housekeeping or managing your Housekeeping? N  Some recent data might be hidden    Patient Care Team: Shelva Majestic, MD as PCP - General (Family Medicine) Lyn Records, MD as PCP - Cardiology (Cardiology)  Indicate any recent Medical Services you may have received from other than [facility] providers in the past year (date may be approximate).     Assessment:   This is a routine wellness examination for [place].  Hearing/Vision screen  Hearing Screening   125Hz  250Hz  500Hz  1000Hz  2000Hz  3000Hz  4000Hz  6000Hz  8000Hz   Right ear:           Left ear:           Comments: Pt states mild loss  Vision Screening Comments: Pt follows up bi annually with eye Dr   Dietary issues and exercise activities discussed: Current Exercise Habits: The patient does not participate in regular exercise at present  Goals    . Patient Stated     Exercise       Depression Screen PHQ 2/9 Scores 02/06/2021 11/29/2020 02/14/2020 01/09/2020 01/09/2020 10/13/2019 07/06/2018  PHQ - 2 Score 0 0 0 2 2 0 0  PHQ- 9 Score - - 4 5 6  - -    Fall Risk Fall Risk  02/06/2021 11/29/2020 01/09/2020 07/06/2018 04/26/2017  Falls in the past year? 0 0 0 No No  Number falls in past yr: 0 0 0 - -  Injury with Fall? 0 0 0 - -  Risk for fall due to : Impaired vision - - - -  Follow up Falls prevention discussed - - - -    FALL RISK PREVENTION PERTAINING TO THE HOME:  Any stairs in or around the home? Yes  If so, are there any without handrails? Yes  Home free of loose throw rugs in walkways, pet beds, electrical cords, etc? Yes  Adequate lighting in your home to reduce risk of falls? Yes   ASSISTIVE DEVICES UTILIZED TO PREVENT FALLS:  Life alert? No  Use of a cane, walker or w/c? No  Grab bars in the bathroom? No  Shower chair or  bench in shower? No  Elevated toilet seat or a handicapped toilet? No   TIMED UP AND GO:  Was the test performed? Yes .  Length of time to ambulate 10 feet: 10 sec.   Gait steady and fast without use of assistive device  Cognitive Function:     6CIT Screen 02/06/2021 01/09/2020  What Year? 0 points 0 points  What month? 0 points 0 points  What time? - 0 points  Count back from 20 2 points 0 points  Months in reverse 0 points 0 points  Repeat phrase 8 points 0 points  Total Score - 0    Immunizations Immunization History  Administered Date(s) Administered  . Influenza, High Dose Seasonal PF 08/17/2016, 08/06/2017  . Influenza,inj,Quad PF,6+ Mos 10/11/2015  . Pneumococcal Conjugate-13 10/11/2015  . Pneumococcal Polysaccharide-23 11/19/2016  . Td 12/25/1995    TDAP status: Due, Education has been provided regarding the importance of this vaccine. Advised may receive this vaccine at local pharmacy or Health Dept. Aware to provide a copy of the vaccination record if obtained from local pharmacy or Health Dept. Verbalized acceptance and understanding.  Flu Vaccine status: Due, Education has been provided regarding the importance of this vaccine. Advised may receive this vaccine at local pharmacy or Health Dept. Aware to provide a copy of the vaccination record if obtained from local pharmacy or Health Dept. Verbalized acceptance and understanding.  Pneumococcal vaccine status: Up to date  Pt stated completed Covid will call back with Dates   Qualifies for Shingles Vaccine? Yes   Zostavax completed No   Shingrix Completed?: No.    Education has been provided regarding the importance of this vaccine. Patient has been advised to call insurance company to determine out of pocket expense if they have not yet received this vaccine. Advised may also receive vaccine at local pharmacy or Health Dept. Verbalized acceptance and understanding.  Screening Tests Health Maintenance  Topic  Date Due  . COVID-19 Vaccine (1) Never done  . COLONOSCOPY (Pts 45-77yrs Insurance coverage will need to be confirmed)  Never done  . TETANUS/TDAP  12/24/2005  . DEXA SCAN  Never done  . INFLUENZA VACCINE  05/26/2021  . MAMMOGRAM  02/21/2022  . Hepatitis C Screening  Completed  . PNA vac Low Risk Adult  Completed  . HPV VACCINES  Aged Out    Health Maintenance  Health Maintenance Due  Topic Date Due  . COVID-19 Vaccine (1) Never done  . COLONOSCOPY (Pts 45-37yrs Insurance coverage will need to be confirmed)  Never done  . TETANUS/TDAP  12/24/2005  . DEXA SCAN  Never done    Colorectal cancer screening: Referral to GI placed 02/06/21. Pt aware the office will call re: appt.  Mammogram status: Completed 02/22/20. Repeat every year  Bone Density status: Ordered 02/06/21. Pt provided with contact info and advised to call to schedule appt.   Additional Screening:  Hepatitis C Screening:  Completed 04/06/16  Vision Screening: Recommended annual ophthalmology exams for early detection of glaucoma and other disorders of the eye. Is the patient up to date with their annual eye exam?  No  Who is the  provider or what is the name of the office in which the patient attends annual eye exams? Pt stated she will follow up for eye exams  If pt is not established with a provider, would they like to be referred to a provider to establish care? No .   Dental Screening: Recommended annual dental exams for proper oral hygiene  Community Resource Referral / Chronic Care Management: CRR required this visit?  No   CCM required this visit?  No      Plan:     I have personally reviewed and noted the following in the patient's chart:   . Medical and social history . Use of alcohol, tobacco or illicit drugs  . Current medications and supplements . Functional ability and status . Nutritional status . Physical activity . Advanced directives . List of other physicians . Hospitalizations,  surgeries, and ER visits in previous 12 months . Vitals . Screenings to include cognitive, depression, and falls . Referrals and appointments  In addition, I have reviewed and discussed with patient certain preventive protocols, quality metrics, and best practice recommendations. A written personalized care plan for preventive services as well as general preventive health recommendations were provided to patient.     Marzella Schlein, LPN   07/09/7828   Nurse Notes: None

## 2021-02-24 ENCOUNTER — Other Ambulatory Visit: Payer: Self-pay | Admitting: Internal Medicine

## 2021-03-10 ENCOUNTER — Other Ambulatory Visit: Payer: Self-pay

## 2021-03-10 ENCOUNTER — Ambulatory Visit (INDEPENDENT_AMBULATORY_CARE_PROVIDER_SITE_OTHER): Payer: Medicare Other | Admitting: Endocrinology

## 2021-03-10 VITALS — BP 120/60 | HR 78 | Ht 66.0 in | Wt 155.8 lb

## 2021-03-10 DIAGNOSIS — R739 Hyperglycemia, unspecified: Secondary | ICD-10-CM | POA: Diagnosis not present

## 2021-03-10 DIAGNOSIS — E0590 Thyrotoxicosis, unspecified without thyrotoxic crisis or storm: Secondary | ICD-10-CM | POA: Diagnosis not present

## 2021-03-10 LAB — T4, FREE: Free T4: 0.71 ng/dL (ref 0.60–1.60)

## 2021-03-10 LAB — HEMOGLOBIN A1C: Hgb A1c MFr Bld: 6.6 % — ABNORMAL HIGH (ref 4.6–6.5)

## 2021-03-10 LAB — TSH: TSH: 10.78 u[IU]/mL — ABNORMAL HIGH (ref 0.35–4.50)

## 2021-03-10 MED ORDER — METHIMAZOLE 10 MG PO TABS: 10.0000 mg | ORAL_TABLET | Freq: Every day | ORAL | 1 refills | Status: DC

## 2021-03-10 NOTE — Progress Notes (Signed)
Subjective:    Patient ID: [name], female    DOB: 1946/01/12, 75 y.o.   MRN: [id number]  HPI Pt returns for f/u of hyperthyroidism (dx'ed 2012; [place] was c/w Grave's Dz; tapazole was chosen as rx, due to 2018 MI).  She takes tapazole, 20 mg QD.  She says she never misses.  pt states she feels well in general.     Past Medical History:  Diagnosis Date  . Anemia 12/22/2017  . Arthritis   . Chronic lower back pain   . Cluster headache   . Coronary artery disease    a. 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 non obs dz LHC 2007. c. Non obs dz (mild-mod) by [facility] 05/17/14 d. cath 05/26/17 mild non obstructive CAD  . COVID-19 08/07/2019  . Diverticulitis    a. Hx microperf 2012 - hospitalizated.  Marland Kitchen GERD (gastroesophageal reflux disease)   . Hypercholesteremia   . Hypertension   . Overactive bladder 09/19/2014   Oxybutynin 5mg  XL--> 10mg   . PVC's (premature ventricular contractions)    a. Hx of trigeminy/bigeminy.  . Seizures (HCC)   . Thyroid disease    hyperthyroid  . UTI (lower urinary tract infection)     Past Surgical History:  Procedure Laterality Date  . ABDOMINAL HYSTERECTOMY  1987   "partial"-still has ovaries  . CARDIAC CATHETERIZATION  05/17/2014  . CORONARY ANGIOPLASTY WITH STENT PLACEMENT  1995   "1"  . JOINT REPLACEMENT     right knee  . LEFT HEART CATH AND CORONARY ANGIOGRAPHY N/A 05/26/2017   Procedure: Left Heart Cath and Coronary Angiography;  Surgeon: 05/19/2014, MD;  Location: [facility] INVASIVE CV LAB;  Service: Cardiovascular;  Laterality: N/A;  . LEFT HEART CATHETERIZATION WITH CORONARY ANGIOGRAM N/A 05/17/2014   Procedure: LEFT HEART CATHETERIZATION WITH CORONARY ANGIOGRAM;  Surgeon: [facility], MD;  Location: [facility] CATH LAB;  Service: Cardiovascular;  Laterality: N/A;  . TOTAL KNEE ARTHROPLASTY Right 2005  . TUBAL LIGATION  1970  . VASCULAR SURGERY      Social History   Socioeconomic History  . Marital status: Married    Spouse name: Not on file  . Number of  children: 4  . Years of education: 4  . Highest education level: Not on file  Occupational History    Comment: retired  Tobacco Use  . Smoking status: Never Smoker  . Smokeless tobacco: Never Used  Vaping Use  . Vaping Use: Never used  Substance and Sexual Activity  . Alcohol use: No  . Drug use: No  . Sexual activity: Never  Other Topics Concern  . Not on file  Social History Narrative   Separated. 4 children from first marriage. 16 grandkids.       Retired from [employer] for 25 years, went to 2006 for 5 years. Retired 2003 after MI      Hobbies: babysit/active with children      Right-handed      Caffeine: 24 oz soda per day      Social Determinants of Health   Financial Resource Strain: Low Risk   . Difficulty of Paying Living Expenses: Not hard at all  Food Insecurity: No Food Insecurity  . Worried About [employer] in the Last Year: Never true  . Ran Out of Food in the Last Year: Never true  Transportation Needs: No Transportation Needs  . Lack of Transportation (Medical): No  . Lack of Transportation (Non-Medical): No  Physical Activity: Inactive  .  Days of Exercise per Week: 0 days  . Minutes of Exercise per Session: 0 min  Stress: No Stress Concern Present  . Feeling of Stress : Not at all  Social Connections: Moderately Isolated  . Frequency of Communication with Friends and Family: Three times a week  . Frequency of Social Gatherings with Friends and Family: Never  . Attends Religious Services: More than 4 times per year  . Active Member of Clubs or Organizations: No  . Attends Banker Meetings: Never  . Marital Status: Widowed  Intimate Partner Violence: Not At Risk  . Fear of Current or Ex-Partner: No  . Emotionally Abused: No  . Physically Abused: No  . Sexually Abused: No    Current Outpatient Medications on File Prior to Visit  Medication Sig Dispense Refill  . Aloe Vera 25 MG CAPS Take by mouth.    Marland Kitchen amLODipine  (NORVASC) 10 MG tablet TAKE 1 TABLET(10 MG) BY MOUTH DAILY 90 tablet 3  . ASPIRIN LOW DOSE PO Take 81 mg by mouth daily.    . Cholecalciferol (VITAMIN D3) 25 MCG (1000 UT) CAPS Take by mouth.    . Cobalamin Combinations (NEURIVA PLUS) CAPS Take by mouth.    . Cyanocobalamin (VITAMIN B-12) 3000 MCG SUBL Place 3,000 mg under the tongue daily.    Marland Kitchen esomeprazole (NEXIUM) 40 MG capsule TAKE 1 CAPSULE(40 MG) BY MOUTH DAILY 90 capsule 2  . ezetimibe (ZETIA) 10 MG tablet TAKE 1 TABLET(10 MG) BY MOUTH DAILY 90 tablet 2  . furosemide (LASIX) 20 MG tablet Take 1 tablet (20 mg total) by mouth daily. 90 tablet 3  . isosorbide mononitrate (IMDUR) 120 MG 24 hr tablet TAKE 1 TABLET(120 MG) BY MOUTH DAILY 90 tablet 3  . metoprolol succinate (TOPROL-XL) 25 MG 24 hr tablet Take 1 tablet (25 mg total) by mouth daily. Please call the office and schedule a follow up appointment 90 tablet 1  . nitroGLYCERIN (NITROSTAT) 0.4 MG SL tablet PLACE 1 TABLET UNDER THE TONGUE EVERY 5 MINUTES FOR CHEST PAIN FOR UPTO 3 DOSES 25 tablet 3  . oxybutynin (DITROPAN-XL) 10 MG 24 hr tablet TAKE 1 TABLET BY MOUTH EVERY DAY 90 tablet 3  . potassium chloride (MICRO-K) 10 MEQ CR capsule Take 1 capsule (10 mEq total) by mouth 2 (two) times daily. 180 capsule 3  . Prenatal Vit-Fe Fumarate-FA (PRENATAL MULTIVITAMIN) TABS tablet Take 1 tablet by mouth daily at 12 noon.    . traMADol (ULTRAM) 50 MG tablet Take 1 tablet (50 mg total) by mouth every 8 (eight) hours as needed for moderate pain or severe pain (back pain, migraines. chronic rx.). 90 tablet 3  . venlafaxine XR (EFFEXOR-XR) 150 MG 24 hr capsule TAKE ONE CAPSULE BY MOUTH AT BEDTIME- NEED VISIT 30 capsule 5  . vitamin C (VITAMIN C) 500 MG tablet Take 1 tablet (500 mg total) by mouth 2 (two) times daily. 14 tablet 0  . Vitamin E 180 MG CAPS Take 2 capsules by mouth daily.    Marland Kitchen zinc sulfate 220 (50 Zn) MG capsule Take 1 capsule (220 mg total) by mouth daily. 7 capsule 0   No current  facility-administered medications on file prior to visit.    Allergies  Allergen Reactions  . Crestor [Rosuvastatin] Other (See Comments)    Hot flashes and severe cramps  . Sulfa Drugs Cross Reactors Shortness Of Breath and Palpitations    Family History  Problem Relation Age of Onset  . CAD Brother   .  Diabetes Brother   . CAD Father   . Diabetes Father   . Diabetes Mother        father, sister, brothers  . Diabetes Sister   . Thyroid disease Sister   . Heart attack Brother   . Prostate cancer Brother   . Thyroid disease Sister   . Diabetes Sister     BP 120/60 (BP Location: Right Arm, Patient Position: Sitting, Cuff Size: Normal)   Pulse 78   Ht 5\' 6"  (1.676 m)   Wt 155 lb 12.8 oz (70.7 kg)   SpO2 97%   BMI 25.15 kg/m    Review of Systems Denies fever    Objective:   Physical Exam VITAL SIGNS:  See vs page GENERAL: no distress NECK: thyroid is slightly enlarged (R>L).  No palpable nodule.   Lab Results  Component Value Date   TSH 10.78 (H) 03/10/2021       Assessment & Plan:  Hyperthyroidism: overcontrolled.  reduce the methimazole to 10 mg per day.

## 2021-03-10 NOTE — Patient Instructions (Addendum)
Blood tests are requested for you today.  We'll let you know about the results.   If ever you have fever while taking methimazole, stop it and call us, even if the reason is obvious, because of the risk of a rare side-effect.  It is best to never miss the medication.  However, if you do miss it, next best is to double up the next time.   Please come back for a follow-up appointment in 4 months.   

## 2021-03-11 ENCOUNTER — Other Ambulatory Visit: Payer: Self-pay | Admitting: Family Medicine

## 2021-03-18 ENCOUNTER — Other Ambulatory Visit: Payer: Self-pay | Admitting: Family Medicine

## 2021-03-25 ENCOUNTER — Other Ambulatory Visit: Payer: Self-pay | Admitting: Family Medicine

## 2021-03-25 ENCOUNTER — Other Ambulatory Visit: Payer: Self-pay | Admitting: Cardiology

## 2021-03-30 ENCOUNTER — Encounter (HOSPITAL_COMMUNITY): Payer: Self-pay | Admitting: Emergency Medicine

## 2021-03-30 ENCOUNTER — Emergency Department (HOSPITAL_COMMUNITY)
Admission: EM | Admit: 2021-03-30 | Discharge: 2021-03-30 | Disposition: A | Discharge status: Home / Self Care | Payer: Medicare Other | Attending: Emergency Medicine | Admitting: Emergency Medicine

## 2021-03-30 ENCOUNTER — Other Ambulatory Visit: Payer: Self-pay

## 2021-03-30 ENCOUNTER — Emergency Department (HOSPITAL_COMMUNITY): Payer: Medicare Other

## 2021-03-30 DIAGNOSIS — Z8616 Personal history of COVID-19: Secondary | ICD-10-CM | POA: Insufficient documentation

## 2021-03-30 DIAGNOSIS — Z955 Presence of coronary angioplasty implant and graft: Secondary | ICD-10-CM | POA: Insufficient documentation

## 2021-03-30 DIAGNOSIS — R079 Chest pain, unspecified: Secondary | ICD-10-CM

## 2021-03-30 DIAGNOSIS — Z79899 Other long term (current) drug therapy: Secondary | ICD-10-CM | POA: Insufficient documentation

## 2021-03-30 DIAGNOSIS — I456 Pre-excitation syndrome: Secondary | ICD-10-CM | POA: Insufficient documentation

## 2021-03-30 DIAGNOSIS — I25110 Atherosclerotic heart disease of native coronary artery with unstable angina pectoris: Secondary | ICD-10-CM | POA: Diagnosis not present

## 2021-03-30 DIAGNOSIS — I10 Essential (primary) hypertension: Secondary | ICD-10-CM | POA: Insufficient documentation

## 2021-03-30 DIAGNOSIS — R110 Nausea: Secondary | ICD-10-CM | POA: Diagnosis not present

## 2021-03-30 DIAGNOSIS — Z7982 Long term (current) use of aspirin: Secondary | ICD-10-CM | POA: Insufficient documentation

## 2021-03-30 DIAGNOSIS — R531 Weakness: Secondary | ICD-10-CM | POA: Diagnosis not present

## 2021-03-30 DIAGNOSIS — I517 Cardiomegaly: Secondary | ICD-10-CM | POA: Diagnosis not present

## 2021-03-30 DIAGNOSIS — R61 Generalized hyperhidrosis: Secondary | ICD-10-CM | POA: Diagnosis not present

## 2021-03-30 DIAGNOSIS — R0789 Other chest pain: Secondary | ICD-10-CM | POA: Insufficient documentation

## 2021-03-30 DIAGNOSIS — Z96651 Presence of right artificial knee joint: Secondary | ICD-10-CM | POA: Diagnosis not present

## 2021-03-30 LAB — CBC
HCT: 38.6 % (ref 36.0–46.0)
Hemoglobin: 12.8 g/dL (ref 12.0–15.0)
MCH: 32.2 pg (ref 26.0–34.0)
MCHC: 33.2 g/dL (ref 30.0–36.0)
MCV: 97 fL (ref 80.0–100.0)
Platelets: 250 10*3/uL (ref 150–400)
RBC: 3.98 MIL/uL (ref 3.87–5.11)
RDW: 13.7 % (ref 11.5–15.5)
WBC: 4.8 10*3/uL (ref 4.0–10.5)
nRBC: 0 % (ref 0.0–0.2)

## 2021-03-30 LAB — BASIC METABOLIC PANEL
Anion gap: 8 (ref 5–15)
BUN: 17 mg/dL (ref 8–23)
CO2: 25 mmol/L (ref 22–32)
Calcium: 9.3 mg/dL (ref 8.9–10.3)
Chloride: 104 mmol/L (ref 98–111)
Creatinine, Ser: 1.13 mg/dL — ABNORMAL HIGH (ref 0.44–1.00)
GFR, Estimated: 51 mL/min — ABNORMAL LOW (ref >60)
Glucose, Bld: 116 mg/dL — ABNORMAL HIGH (ref 70–99)
Potassium: 3.3 mmol/L — ABNORMAL LOW (ref 3.5–5.1)
Sodium: 137 mmol/L (ref 135–145)

## 2021-03-30 LAB — TROPONIN I (HIGH SENSITIVITY)
Troponin I (High Sensitivity): 11 ng/L (ref <18)
Troponin I (High Sensitivity): 11 ng/L (ref <18)

## 2021-03-30 MED ORDER — ALUM & MAG HYDROXIDE-SIMETH 200-200-20 MG/5ML PO SUSP
30.0000 mL | Freq: Once | ORAL | Status: AC
  Administered 2021-03-30: 30 mL via ORAL
  Filled 2021-03-30: qty 30

## 2021-03-30 MED ORDER — ACETAMINOPHEN 325 MG PO TABS
650.0000 mg | ORAL_TABLET | Freq: Once | ORAL | Status: AC
  Administered 2021-03-30: 650 mg via ORAL
  Filled 2021-03-30: qty 2

## 2021-03-30 MED ORDER — LIDOCAINE VISCOUS HCL 2 % MT SOLN
15.0000 mL | Freq: Once | OROMUCOSAL | Status: AC
  Administered 2021-03-30: 15 mL via ORAL
  Filled 2021-03-30: qty 15

## 2021-03-30 NOTE — ED Provider Notes (Signed)
[facility] Provider Note   CSN: [id number] Arrival date & time: 03/30/21  [id number]     History Chief Complaint  Patient presents with  . Chest Pain  . Weakness    Sherlene ShamsCary Aldaba is a 75 y.o. female.  Presented to ER with concern for chest pain.  Patient states that over the past couple days she has been having intermittent episodes of chest discomfort.  Yesterday seem to come and go at random, no alleviating or aggravating factors, no specific triggers.  Not associated with exertion.  Yesterday she also felt somewhat nauseated and generally weak.  All of the symptoms have since resolved and she has no ongoing pain at present.  Per last cardiology note, patient has history of coronary vasospasm, history of short PR but cardiologist not convinced she has WPW, last heart catheterization in 2018 shows mild nonobstructive coronary artery disease.  February 2022 had normal stress test without evidence of prior infarct or ischemia.  Normal LVEF.  HPI     Past Medical History:  Diagnosis Date  . Anemia 12/22/2017  . Arthritis   . Chronic lower back pain   . Cluster headache   . Coronary artery disease    a. 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 non obs dz LHC 2007. c. Non [facility]14 d. cath 05/26/17 mild non obstructive CAD  . COVID-19 08/07/2019  . Diverticulitis    a. Hx microperf 2012 - hospitalizated.  Marland Kitchen. GERD (gastroesophageal reflux disease)   . Hypercholesteremia   . Hypertension   . Overactive bladder 09/19/2014   Oxybutynin 5mg  XL--> 10mg   . PVC's (premature ventricular contractions)    a. Hx of trigeminy/bigeminy.  . Seizures (HCC)   . Thyroid disease    hyperthyroid  . UTI (lower urinary tract infection)     Patient Active Problem List   Diagnosis Date Noted  . WPW (Wolff-Parkinson-White syndrome) 03/05/2020  . Chest pain 02/03/2020  . Aortic atherosclerosis (HCC) 01/09/2020  . Heart failure with reduced ejection fraction  (HCC) 08/21/2019  . COVID-19 08/07/2019  . Anemia 12/22/2017  . Hyperthyroidism 05/22/2017  . Overactive bladder 09/19/2014  . Migraine 09/19/2014  . Hyperglycemia 09/19/2014  . Hypokalemia 06/01/2014  . Coronary artery disease involving native coronary artery of native heart with angina pectoris (HCC)   . Seizures (HCC)   . Arthritis   . Hypercholesteremia   . GERD (gastroesophageal reflux disease) 10/04/2011  . HTN (hypertension) 10/01/2011    Past Surgical History:  Procedure Laterality Date  . ABDOMINAL HYSTERECTOMY  1987   "partial"-still has ovaries  . CARDIAC CATHETERIZATION  05/17/2014  . CORONARY ANGIOPLASTY WITH STENT PLACEMENT  1995   "1"  . JOINT REPLACEMENT     right knee  . LEFT HEART CATH AND CORONARY ANGIOGRAPHY N/A 05/26/2017   Procedure: Left Heart Cath and Coronary Angiography;  Surgeon: Corky CraftsVaranasi, Jayadeep S, MD;  [facility];  Service: Cardiovascular;  Laterality: N/A;  . LEFT HEART CATHETERIZATION WITH CORONARY ANGIOGRAM N/A 05/17/2014   Procedure: LEFT HEART CATHETERIZATION WITH CORONARY ANGIOGRAM;  Surgeon: Marykay Lexavid W Harding, MD;  [facility];  Service: Cardiovascular;  Laterality: N/A;  . TOTAL KNEE ARTHROPLASTY Right 2005  . TUBAL LIGATION  1970  . VASCULAR SURGERY       OB History   No obstetric history on file.     Family History  Problem Relation Age of Onset  . CAD Brother   . Diabetes Brother   .  CAD Father   . Diabetes Father   . Diabetes Mother        father, sister, brothers  . Diabetes Sister   . Thyroid disease Sister   . Heart attack Brother   . Prostate cancer Brother   . Thyroid disease Sister   . Diabetes Sister     Social History   Tobacco Use  . Smoking status: Never Smoker  . Smokeless tobacco: Never Used  Vaping Use  . Vaping Use: Never used  Substance Use Topics  . Alcohol use: No  . Drug use: No    Home Medications Prior to Admission medications   Medication Sig Start Date End Date  Taking? Authorizing Provider  Aloe Vera 25 MG CAPS Take by mouth.    [provider]  amLODipine (NORVASC) 10 MG tablet TAKE 1 TABLET(10 MG) BY MOUTH DAILY 04/11/20   Shelva Majestic, MD  ASPIRIN LOW DOSE PO Take 81 mg by mouth daily.    [provider]  atorvastatin (LIPITOR) 40 MG tablet TAKE 1 TABLET(40 MG) BY MOUTH DAILY 03/12/21   Shelva Majestic, MD  Cholecalciferol (VITAMIN D3) 25 MCG (1000 UT) CAPS Take by mouth.    [provider]  Cobalamin Combinations (NEURIVA PLUS) CAPS Take by mouth.    [provider]  Cyanocobalamin (VITAMIN B-12) 3000 MCG SUBL Place 3,000 mg under the tongue daily.    [provider]  esomeprazole (NEXIUM) 40 MG capsule TAKE 1 CAPSULE(40 MG) BY MOUTH DAILY 03/19/21   Shelva Majestic, MD  ezetimibe (ZETIA) 10 MG tablet TAKE 1 TABLET(10 MG) BY MOUTH DAILY 03/25/21   Shelva Majestic, MD  furosemide (LASIX) 20 MG tablet Take 1 tablet (20 mg total) by mouth daily. 01/31/21   Allred, Fayrene Fearing, MD  isosorbide mononitrate (IMDUR) 120 MG 24 hr tablet TAKE 1 TABLET(120 MG) BY MOUTH DAILY 10/22/20   Georgie Chard D, NP  methimazole (TAPAZOLE) 10 MG tablet Take 1 tablet (10 mg total) by mouth daily. 03/10/21   Romero Belling, MD  metoprolol succinate (TOPROL-XL) 25 MG 24 hr tablet TAKE 1 TABLET(25 MG) BY MOUTH DAILY 03/26/21   Allred, Fayrene Fearing, MD  nitroGLYCERIN (NITROSTAT) 0.4 MG SL tablet PLACE 1 TABLET UNDER THE TONGUE EVERY 5 MINUTES FOR CHEST PAIN FOR UPTO 3 DOSES 02/25/21   Allred, Fayrene Fearing, MD  oxybutynin (DITROPAN-XL) 10 MG 24 hr tablet TAKE 1 TABLET BY MOUTH EVERY DAY 03/25/21   Shelva Majestic, MD  potassium chloride (MICRO-K) 10 MEQ CR capsule Take 1 capsule (10 mEq total) by mouth 2 (two) times daily. 11/29/20   Shelva Majestic, MD  Prenatal Vit-Fe Fumarate-FA (PRENATAL MULTIVITAMIN) TABS tablet Take 1 tablet by mouth daily at 12 noon.    [provider]  traMADol (ULTRAM) 50 MG tablet Take 1 tablet (50 mg total) by mouth  every 8 (eight) hours as needed for moderate pain or severe pain (back pain, migraines. chronic rx.). 11/29/20   Shelva Majestic, MD  venlafaxine XR (EFFEXOR-XR) 150 MG 24 hr capsule TAKE ONE CAPSULE BY MOUTH AT BEDTIME- NEED VISIT 08/30/20   Shelva Majestic, MD  vitamin C (VITAMIN C) 500 MG tablet Take 1 tablet (500 mg total) by mouth 2 (two) times daily. 08/08/19   Burnadette Pop, MD  Vitamin E 180 MG CAPS Take 2 capsules by mouth daily.    [provider]  zinc sulfate 220 (50 Zn) MG capsule Take 1 capsule (220 mg total) by mouth daily. 08/09/19  Burnadette Pop, MD    Allergies    Crestor [rosuvastatin] and Sulfa drugs cross reactors  Review of Systems   Review of Systems  Constitutional: Positive for fatigue. Negative for chills and fever.  HENT: Negative for ear pain and sore throat.   Eyes: Negative for pain and visual disturbance.  Respiratory: Negative for cough and shortness of breath.   Cardiovascular: Positive for chest pain. Negative for palpitations.  Gastrointestinal: Positive for nausea. Negative for abdominal pain and vomiting.  Genitourinary: Negative for dysuria and hematuria.  Musculoskeletal: Negative for arthralgias and back pain.  Skin: Negative for color change and rash.  Neurological: Negative for seizures and syncope.  All other systems reviewed and are negative.   Physical Exam Updated Vital Signs BP 125/71   Pulse 63   Temp 98.4 F (36.9 C) (Oral)   Resp 18   SpO2 97%   Physical Exam Vitals and nursing note reviewed.  Constitutional:      General: She is not in acute distress.    Appearance: She is well-developed.  HENT:     Head: Normocephalic and atraumatic.  Eyes:     Conjunctiva/sclera: Conjunctivae normal.  Cardiovascular:     Rate and Rhythm: Normal rate and regular rhythm.     Heart sounds: No murmur heard.   Pulmonary:     Effort: Pulmonary effort is normal. No respiratory distress.     Breath sounds: Normal breath  sounds.  Abdominal:     Palpations: Abdomen is soft.     Tenderness: There is no abdominal tenderness.  Musculoskeletal:     Cervical back: Neck supple.  Skin:    General: Skin is warm and dry.  Neurological:     General: No focal deficit present.     Mental Status: She is alert.     ED Results / Procedures / Treatments   Labs (all labs ordered are listed, but only abnormal results are displayed) Labs Reviewed  BASIC METABOLIC PANEL - Abnormal; Notable for the following components:      Result Value   Potassium 3.3 (*)    Glucose, Bld 116 (*)    Creatinine, Ser 1.13 (*)    GFR, Estimated 51 (*)    All other components within normal limits  CBC  TROPONIN I (HIGH SENSITIVITY)  TROPONIN I (HIGH SENSITIVITY)    EKG EKG Interpretation  Date/Time:  Sunday March 30 2021 05:28:18 EDT Ventricular Rate:  67 PR Interval:  118 QRS Duration: 112 QT Interval:  422 QTC Calculation: 445 R Axis:   69 Text Interpretation: Sinus rhythm with occasional Premature ventricular complexes ST & T wave abnormality, consider inferior ischemia ST & T wave abnormality, consider anterolateral ischemia Abnormal ECG no acute changes from prior ecg no acute STEMI Confirmed by Marianna Fuss ([id number]) on 03/30/2021 7:35:51 AM   Radiology DG Chest 2 View  Result Date: 03/30/2021 CLINICAL DATA:  76 year old female with 24 hours of chest pain. Nausea and diaphoresis. EXAM: CHEST - 2 VIEW COMPARISON:  Chest radiographs 02/04/2020 and earlier. FINDINGS: Stable borderline to mild cardiomegaly. Other mediastinal contours are within normal limits. Visualized tracheal air column is within normal limits. Lung volumes are stable. Both lungs appear clear. No pneumothorax or pleural effusion. Stable visualized osseous structures. Negative visible bowel gas pattern. IMPRESSION: No acute cardiopulmonary abnormality. Electronically Signed   By: Odessa Fleming M.D.   On: 03/30/2021 06:12    Procedures Procedures   Medications  Ordered in ED Medications  alum & mag hydroxide-simeth (  MAALOX/MYLANTA) 200-200-20 MG/5ML suspension 30 mL (30 mLs Oral Given 03/30/21 0841)    And  lidocaine (XYLOCAINE) 2 % viscous mouth solution 15 mL (15 mLs Oral Given 03/30/21 0841)  acetaminophen (TYLENOL) tablet 650 mg (650 mg Oral Given 03/30/21 0841)    ED Course  I have reviewed the triage vital signs and the nursing notes.  Pertinent labs & imaging results that were available during my care of the patient were reviewed by me and considered in my medical decision making (see chart for details).    MDM Rules/Calculators/A&P                            75 year old lady presented to ER with chief complaint of chest pain.  On exam, patient appears well, no ongoing symptoms at present.  Vitals are all stable.  EKG negative for STEMI but does have some nonspecific ST segment and T wave changes, per review of chart however these are not new.  Troponin x2 is within normal limits.  Had mild nonobstructive disease on last LHC, normal stress in February 2022.  Given all of these findings, I have a very low suspicion for acute coronary syndrome at present.  Her chest x-ray negative.  On reassessment she remains well-appearing in no distress.  At this time believe she is stable for discharge and outpatient management.  Recommend she follow-up with her primary doctor and cardiology outpatient.  After the discussed management above, the patient was determined to be safe for discharge.  The patient was in agreement with this plan and all questions regarding their care were answered.  ED return precautions were discussed and the patient will return to the ED with any significant worsening of condition.  Final Clinical Impression(s) / ED Diagnoses Final diagnoses:  Chest pain, unspecified type    Rx / DC Orders ED Discharge Orders    None       Milagros Loll, MD 03/30/21 [phone]

## 2021-03-30 NOTE — ED Notes (Signed)
Pt d/c home per MD order. Discharge summary reviewed, pt verbalizes understanding. No s/s of acute distress noted at discharge.  

## 2021-03-30 NOTE — ED Triage Notes (Signed)
Pt reports weakness "for days".  States she woke up Saturday morning feeling poor, describes chest discomfort and "sweats that come and go."

## 2021-03-30 NOTE — ED Provider Notes (Signed)
Emergency Medicine Provider Triage Evaluation Note  [name] , a 75 y.o. female  was evaluated in triage.  Pt complains of chest pain onset approximately 24 hours ago.  Patient reports that intermittent but is becoming more intense.  She reports associated nausea and diaphoresis.  She has had some lightheadedness but denies syncope.  No vomiting.  Patient has a history of coronary artery disease with stent placement, angina, heart failure with reduced ejection fraction.  Denies shortness of breath or leg swelling..  Review of Systems  Positive: Chest pain, nausea diaphoresis Negative: Syncope, leg swelling, shortness of breath on exertion  Physical Exam  BP 132/69 (BP Location: Left Arm)   Pulse 73   Temp 98.4 F (36.9 C) (Oral)   Resp 20   SpO2 98%  Gen:   Awake, no distress   Resp:  Normal effort  MSK:   Moves extremities without difficulty  Other:  Clear and equal breath sounds.  Regular rate and rhythm.  Medical Decision Making  Medically screening exam initiated at 5:43 AM.  Appropriate orders placed.  [name] was informed that the remainder of the evaluation will be completed by another provider, this initial triage assessment does not replace that evaluation, and the importance of remaining in the ED until their evaluation is complete.  Cardiac work-up initiated.   [name], Boyd Kerbs 03/30/21 0545    Zadie Rhine, MD 03/31/21 [phone]

## 2021-03-30 NOTE — Discharge Instructions (Signed)
Follow-up with your primary care doctor and your cardiologist.  If you have recurrent chest pain, difficulty breathing or other new concerning symptom, come back to ER for reassessment at that time.

## 2021-04-03 ENCOUNTER — Encounter: Payer: Self-pay | Admitting: Family Medicine

## 2021-04-03 ENCOUNTER — Other Ambulatory Visit: Payer: Self-pay

## 2021-04-03 ENCOUNTER — Ambulatory Visit (INDEPENDENT_AMBULATORY_CARE_PROVIDER_SITE_OTHER): Payer: Medicare Other | Admitting: Family Medicine

## 2021-04-03 VITALS — BP 118/64 | HR 67 | Temp 98.3°F | Ht 66.0 in | Wt 155.8 lb

## 2021-04-03 DIAGNOSIS — H9193 Unspecified hearing loss, bilateral: Secondary | ICD-10-CM | POA: Diagnosis not present

## 2021-04-03 DIAGNOSIS — I10 Essential (primary) hypertension: Secondary | ICD-10-CM

## 2021-04-03 DIAGNOSIS — Z1211 Encounter for screening for malignant neoplasm of colon: Secondary | ICD-10-CM

## 2021-04-03 DIAGNOSIS — Z780 Asymptomatic menopausal state: Secondary | ICD-10-CM

## 2021-04-03 DIAGNOSIS — E7800 Pure hypercholesterolemia, unspecified: Secondary | ICD-10-CM | POA: Diagnosis not present

## 2021-04-03 DIAGNOSIS — Z79899 Other long term (current) drug therapy: Secondary | ICD-10-CM

## 2021-04-03 LAB — LIPID PANEL
Cholesterol: 151 mg/dL (ref 0–200)
HDL: 45.3 mg/dL (ref >39.00)
LDL Cholesterol: 88 mg/dL (ref 0–99)
NonHDL: 105.99
Total CHOL/HDL Ratio: 3
Triglycerides: 88 mg/dL (ref 0.0–149.0)
VLDL: 17.6 mg/dL (ref 0.0–40.0)

## 2021-04-03 LAB — COMPREHENSIVE METABOLIC PANEL
ALT: 16 U/L (ref 0–35)
AST: 18 U/L (ref 0–37)
Albumin: 4.1 g/dL (ref 3.5–5.2)
Alkaline Phosphatase: 71 U/L (ref 39–117)
BUN: 13 mg/dL (ref 6–23)
CO2: 29 mEq/L (ref 19–32)
Calcium: 9.2 mg/dL (ref 8.4–10.5)
Chloride: 105 mEq/L (ref 96–112)
Creatinine, Ser: 1.01 mg/dL (ref 0.40–1.20)
GFR: 54.87 mL/min — ABNORMAL LOW (ref >60.00)
Glucose, Bld: 71 mg/dL (ref 70–99)
Potassium: 3.7 mEq/L (ref 3.5–5.1)
Sodium: 140 mEq/L (ref 135–145)
Total Bilirubin: 0.9 mg/dL (ref 0.2–1.2)
Total Protein: 7.4 g/dL (ref 6.0–8.3)

## 2021-04-03 LAB — CBC WITH DIFFERENTIAL/PLATELET
Basophils Absolute: 0 10*3/uL (ref 0.0–0.1)
Basophils Relative: 0.8 % (ref 0.0–3.0)
Eosinophils Absolute: 0.3 10*3/uL (ref 0.0–0.7)
Eosinophils Relative: 6.5 % — ABNORMAL HIGH (ref 0.0–5.0)
HCT: 37.7 % (ref 36.0–46.0)
Hemoglobin: 12.7 g/dL (ref 12.0–15.0)
Lymphocytes Relative: 38.6 % (ref 12.0–46.0)
Lymphs Abs: 1.5 10*3/uL (ref 0.7–4.0)
MCHC: 33.6 g/dL (ref 30.0–36.0)
MCV: 95.8 fl (ref 78.0–100.0)
Monocytes Absolute: 0.3 10*3/uL (ref 0.1–1.0)
Monocytes Relative: 8.5 % (ref 3.0–12.0)
Neutro Abs: 1.8 10*3/uL (ref 1.4–7.7)
Neutrophils Relative %: 45.6 % (ref 43.0–77.0)
Platelets: 213 10*3/uL (ref 150.0–400.0)
RBC: 3.94 Mil/uL (ref 3.87–5.11)
RDW: 13.9 % (ref 11.5–15.5)
WBC: 3.9 10*3/uL — ABNORMAL LOW (ref 4.0–10.5)

## 2021-04-03 LAB — VITAMIN B12: Vitamin B-12: 547 pg/mL (ref 211–911)

## 2021-04-03 MED ORDER — ESOMEPRAZOLE MAGNESIUM 20 MG PO CPDR: 20.0000 mg | DELAYED_RELEASE_CAPSULE | Freq: Every day | ORAL | 3 refills | Status: DC

## 2021-04-03 NOTE — Progress Notes (Signed)
Phone [phone]   Subjective:  Patient presents today for their annual physical. Chief complaint-noted.   See problem oriented charting- ROS- full  review of systems was completed and negative except for: Hearing loss, chest pain or weekend as well as shortness of breath and sweating  The following were reviewed and entered/updated in epic: Past Medical History:  Diagnosis Date   Anemia 12/22/2017   Arthritis    Chronic lower back pain    Cluster headache    Coronary artery disease    a. 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 non obs dz LHC 2007. c. [facility] d. cath 05/26/17 mild non obstructive CAD   COVID-19 08/07/2019   Diverticulitis    a. Hx microperf 2012 - hospitalizated.   GERD (gastroesophageal reflux disease)    Hypercholesteremia    Hypertension    Overactive bladder 09/19/2014   Oxybutynin 5mg  XL--> 10mg    PVC's (premature ventricular contractions)    a. Hx of trigeminy/bigeminy.   Seizures (HCC)    Thyroid disease    hyperthyroid   UTI (lower urinary tract infection)    Patient Active Problem List   Diagnosis Date Noted   WPW (Wolff-Parkinson-White syndrome) 03/05/2020    Priority: High   Heart failure with reduced ejection fraction (HCC) 08/21/2019    Priority: High   COVID-19 08/07/2019    Priority: High   Hyperthyroidism 05/22/2017    Priority: High   Migraine 09/19/2014    Priority: High   Coronary artery disease involving native coronary artery of native heart with angina pectoris (HCC)     Priority: High   Seizures (HCC)     Priority: High   Overactive bladder 09/19/2014    Priority: Medium   Hyperglycemia 09/19/2014    Priority: Medium   Hypercholesteremia     Priority: Medium   GERD (gastroesophageal reflux disease) 10/04/2011    Priority: Medium   HTN (hypertension) 10/01/2011    Priority: Medium   Aortic atherosclerosis (HCC) 01/09/2020    Priority: Low   Hypokalemia 06/01/2014    Priority: Low   Arthritis      Priority: Low   Chest pain 02/03/2020   Anemia 12/22/2017   Past Surgical History:  Procedure Laterality Date   ABDOMINAL HYSTERECTOMY  1987   "partial"-still has ovaries   CARDIAC CATHETERIZATION  05/17/2014   CORONARY ANGIOPLASTY WITH STENT PLACEMENT  1995   "1"   JOINT REPLACEMENT     right knee   LEFT HEART CATH AND CORONARY ANGIOGRAPHY N/A 05/26/2017   Procedure: Left Heart Cath and Coronary Angiography;  Surgeon: Corky CraftsVaranasi, Jayadeep S, MD;  [facility];  Service: Cardiovascular;  Laterality: N/A;   LEFT HEART CATHETERIZATION WITH CORONARY ANGIOGRAM N/A 05/17/2014   Procedure: LEFT HEART CATHETERIZATION WITH CORONARY ANGIOGRAM;  Surgeon: Marykay Lexavid W Harding, MD;  [facility];  Service: Cardiovascular;  Laterality: N/A;   TOTAL KNEE ARTHROPLASTY Right 2005   TUBAL LIGATION  1970   VASCULAR SURGERY      Family History  Problem Relation Age of Onset   CAD Brother    Diabetes Brother    CAD Father    Diabetes Father    Diabetes Mother        father, sister, brothers   Diabetes Sister    Thyroid disease Sister    Heart attack Brother    Prostate cancer Brother    Thyroid disease Sister    Diabetes Sister     Medications-  reviewed and updated Current Outpatient Medications  Medication Sig Dispense Refill   Aloe Vera 25 MG CAPS Take by mouth.     amLODipine (NORVASC) 10 MG tablet TAKE 1 TABLET(10 MG) BY MOUTH DAILY 90 tablet 3   ASPIRIN LOW DOSE PO Take 81 mg by mouth daily.     atorvastatin (LIPITOR) 40 MG tablet TAKE 1 TABLET(40 MG) BY MOUTH DAILY 90 tablet 3   Cholecalciferol (VITAMIN D3) 25 MCG (1000 UT) CAPS Take by mouth.     Cobalamin Combinations (NEURIVA PLUS) CAPS Take by mouth.     Cyanocobalamin (VITAMIN B-12) 3000 MCG SUBL Place 3,000 mg under the tongue daily.     esomeprazole (NEXIUM) 20 MG capsule Take 1 capsule (20 mg total) by mouth daily. 90 capsule 3   ezetimibe (ZETIA) 10 MG tablet TAKE 1 TABLET(10 MG) BY MOUTH DAILY 90 tablet 2    furosemide (LASIX) 20 MG tablet Take 1 tablet (20 mg total) by mouth daily. 90 tablet 3   isosorbide mononitrate (IMDUR) 120 MG 24 hr tablet TAKE 1 TABLET(120 MG) BY MOUTH DAILY 90 tablet 3   methimazole (TAPAZOLE) 10 MG tablet Take 1 tablet (10 mg total) by mouth daily. 90 tablet 1   metoprolol succinate (TOPROL-XL) 25 MG 24 hr tablet TAKE 1 TABLET(25 MG) BY MOUTH DAILY 90 tablet 1   nitroGLYCERIN (NITROSTAT) 0.4 MG SL tablet PLACE 1 TABLET UNDER THE TONGUE EVERY 5 MINUTES FOR CHEST PAIN FOR UPTO 3 DOSES 25 tablet 3   oxybutynin (DITROPAN-XL) 10 MG 24 hr tablet TAKE 1 TABLET BY MOUTH EVERY DAY 90 tablet 3   potassium chloride (MICRO-K) 10 MEQ CR capsule Take 1 capsule (10 mEq total) by mouth 2 (two) times daily. 180 capsule 3   Prenatal Vit-Fe Fumarate-FA (PRENATAL MULTIVITAMIN) TABS tablet Take 1 tablet by mouth daily at 12 noon.     traMADol (ULTRAM) 50 MG tablet Take 1 tablet (50 mg total) by mouth every 8 (eight) hours as needed for moderate pain or severe pain (back pain, migraines. chronic rx.). 90 tablet 3   venlafaxine XR (EFFEXOR-XR) 150 MG 24 hr capsule TAKE ONE CAPSULE BY MOUTH AT BEDTIME- NEED VISIT 30 capsule 5   vitamin C (VITAMIN C) 500 MG tablet Take 1 tablet (500 mg total) by mouth 2 (two) times daily. 14 tablet 0   Vitamin E 180 MG CAPS Take 2 capsules by mouth daily.     zinc sulfate 220 (50 Zn) MG capsule Take 1 capsule (220 mg total) by mouth daily. 7 capsule 0   No current facility-administered medications for this visit.    Allergies-reviewed and updated Allergies  Allergen Reactions   Crestor [Rosuvastatin] Other (See Comments)    Hot flashes and severe cramps   Sulfa Drugs Cross Reactors Shortness Of Breath and Palpitations    Social History   Social History Narrative   Separated. 4 children from first marriage. 16 grandkids.       Retired from [employer] for 25 years, went to [employer] for 5 years. Retired 2003 after MI      Hobbies: babysit/active with children       Right-handed      Caffeine: 24 oz soda per day      Objective  Objective:  BP 118/64   Pulse 67   Temp 98.3 F (36.8 C)   Ht 5\' 6"  (1.676 m)   Wt 155 lb 12.8 oz (70.7 kg)   SpO2 98%   BMI 25.15 kg/m  Gen: NAD, resting comfortably HEENT: Mucous membranes are moist. Oropharynx normal. TMs normal, dentures noted, nasal turbinates normal Neck: mild thyromegaly, no carotid bruits CV: RRR no murmurs rubs or gallops Lungs: CTAB no crackles, wheeze, rhonchi Abdomen: soft/nontender/nondistended/normal bowel sounds. No rebound or guarding.  Ext: no edema Skin: warm, dry Neuro: grossly normal, moves all extremities, PERRLA   Assessment and Plan   75 y.o. female presenting for annual physical.  Health Maintenance counseling: 1. Anticipatory guidance: Patient counseled regarding regular dental exams -patient has dentures, eye exams -encouraged follow-up,  avoiding smoking and second hand smoke , limiting alcohol to 1 beverage per day .   2. Risk factor reduction:  Advised patient of need for regular exercise and diet rich and fruits and vegetables to reduce risk of heart attack and stroke. Exercise- some walking and considering some bicycling . Diet-she wants to improve- she doesn't eat healthy, no fried greasy foods but still unhealthy.  Wt Readings from Last 3 Encounters:  04/03/21 155 lb 12.8 oz (70.7 kg)  03/10/21 155 lb 12.8 oz (70.7 kg)  02/06/21 158 lb (71.7 kg)   3. Immunizations/screenings/ancillary studies Immunization History  Administered Date(s) Administered   Influenza, High Dose Seasonal PF 08/17/2016, 08/06/2017   Influenza,inj,Quad PF,6+ Mos 10/11/2015   Pneumococcal Conjugate-13 10/11/2015   Pneumococcal Polysaccharide-23 11/19/2016   Td 12/25/1995   Health Maintenance Due  Topic Date Due   COLONOSCOPY-will get scheduled this week Never done   Zoster Vaccines- Shingrix (1 of 2) Please check with your pharmacy to see if they have the shingrix vaccine. If  they do- please get this immunization and update [place] by phone call or mychart with dates you receive the vaccine  Never done   TETANUS/TDAP-recommend doing this at pharmacy 12/24/2005   DEXA SCAN -  Schedule your bone density test at check out desk. You may also call directly to X-ray at [phone] to schedule an appointment that is convenient for you.  - located [address] across the street from [place] - in the basement - you do need an appointment for the bone density tests.   Never done   4. Cervical cancer screening- past age based screening 5. Breast cancer screening-  breast exam and mammogram on 02/22/20 6. Colon cancer screening -we will refer patient today for colonoscopy-overdue 7. Skin cancer screening- lower risk due to melanin content. advised regular sunscreen use. Denies worrisome, changing, or new skin lesions.  8. Birth control/STD check- postmenopausal 9. Osteoporosis screening at 19- needs to schedule a bone density- last one was at age 35 -never smoker  Status of chronic or acute concerns   #ED visit- she had to visit the ER 03/30/21 and presented with chest Pain. She reported for the past days prior to the visit she had intermittent episodes of chest discomfort. It seemed to come and go sporadically, no alleviating or aggravating factors noted, and no specific triggers. -has been fine since Satuday -Reassuring stress test in February.  Troponin trend was negative during ED stay.  Chest x-ray reassuring. -Patient reports she had had increased stress over the last 2 weeks but has been taking it easier and that has been helpful -I still recommended cardiology follow-up  #Nonobstructive CAD--by cath in 2018 with angina thought primarily related to coronary vasospasm.   #hyperlipidemia #Aortic atherosclerosis S: Medication:Aspirin 81 mg, atorvastatin 40 mg (reported hot flashes and cramps on rosuvastatin 20 mg), Zetia 10 mg, Imdur 120 mg, metoprolol 25 mg  standard release A/P:  I do not strongly suspect cardiac origin for recent chest pain but still recommend cardiology follow-up.  Continue current medications for now - For hyperlipidemia and aortic atherosclerosis-we will target LDL under 70-labs will be updated today  #Wolff-Parkinson-White-follows with Dr. Ladona Ridgel.  Patient reports stability-encouraged cardiology follow-up  #Heart failure with reduced ejection fraction. Most recent ejection fraction improved to 55 to 60% in February 2021 S: Medication:Lasix 20 mg daily- helps with urination, also on potassium  Edema: no Weight gain: no Shortness of breath: rare but stable Orthopnea/PND: no- no more pillows added  A/P: Appears stable/euvolemic. Continue currents medications  #hypertension S: medication: Amlodipine 10 mg, Lasix 20 mg, Imdur 120 mg, metoprolol 25 mg extended release Home readings #s: average 170/60 BP Readings from Last 3 Encounters:  04/03/21 118/64  03/30/21 125/71  03/10/21 120/60  A/P:  Stable. Continue current medications.    #hyperthyroidism-follows with endocrine Dr. Everardo All S: compliant On thyroid medication-methimazole 30 mg reduced to 1 pill per day per Dr. Everardo All Lab Results  Component Value Date   TSH 10.78 (H) 03/10/2021  A/P: Suspected mild poor control-patient has not had a chance to reduce from 2 pills to 1 pill per day of methimazole 10 mg (in the past has been as high as 30 mg)- she agrees to make this adjustment today.   #Migraines S: Medication: Effexor 150 mg extended release reportedly for headaches for years. A/P: Patient reports Effexor has continued to control ease-she wants to continue medication-may refill if needed  # Acid Reflux S:medications: Nexium 40 mg- If she misses 2 doses she can feeli it- willing to try to reduce to 20 mg dose A/P:  well controlled- will trial 20 mg dose of Nexium, if not effective will go back to 40 mg dose. -We will check B12 with long-term PPI  use  #Seizures (HCC) S: Patient has reported history of seizures after injury in childhood following. Had tolerated tramadol in the past for migraines without recurrent seizures. No regular antiepileptic- none since her childhood A/P: No recurrence even despite her taking tramadol-no changes in medication   #Overactive bladder S: Medication: Oxybutynin 10 mg standard release. We discussed risks of confusion/falls once again today A/P: Reasonable control-continue current medication  #back pain- working with pain management on oxycodone-hydrocodone in the past-recently tolerating with tramadol alone through our office  #Hearing Loss in Left Ear- for 2 years. Seems to be worsening in the last few months- will place a referral to ENT  Postmenopausal - Plan: DG Bone Density  High risk medication use - Plan: B12  Hypercholesteremia - Plan: CBC with Differential/Platelet, Comprehensive metabolic panel, Lipid panel  Primary hypertension - Plan: CBC with Differential/Platelet, Comprehensive metabolic panel, Lipid panel  Screen for colon cancer - Plan: Ambulatory referral to Gastroenterology  Bilateral hearing loss, unspecified hearing loss type - Plan: Ambulatory referral to ENT  Recommended follow up: Return in about 6 months (around 10/03/2021) for follow up- or sooner if needed. Future Appointments  Date Time Provider Department Center  07/15/2021 11:00 AM Romero Belling, MD [facility] None  02/12/2022  2:30 PM [facility] HEALTH COACH [facility]   Lab/Order associations: I do not believe patient was fasting   ICD-10-CM   1. Postmenopausal  Z78.0 DG Bone Density    2. High risk medication use  Z79.899 B12    3. Hypercholesteremia  E78.00 CBC with Differential/Platelet    Comprehensive metabolic panel    Lipid panel    4. Primary hypertension  I10 CBC with Differential/Platelet  Comprehensive metabolic panel    Lipid panel    5. Screen for colon cancer  Z12.11 Ambulatory  referral to Gastroenterology    6. Bilateral hearing loss, unspecified hearing loss type  H91.93 Ambulatory referral to ENT     Meds ordered this encounter  Medications   esomeprazole (NEXIUM) 20 MG capsule    Sig: Take 1 capsule (20 mg total) by mouth daily.    Dispense:  90 capsule    Refill:  3   I,Harris Phan,acting as a scribe for Tana Conch, MD.,have documented all relevant documentation on the behalf of Tana Conch, MD,as directed by  Tana Conch, MD while in the presence of Tana Conch, MD.   I, Tana Conch, MD, have reviewed all documentation for this visit. The documentation on 04/03/21 for the exam, diagnosis, procedures, and orders are all accurate and complete.   Return precautions advised.  Tana Conch, MD

## 2021-04-03 NOTE — Patient Instructions (Addendum)
Health Maintenance Due  Topic Date Due   COLONOSCOPY-We will call you within two weeks about your referral to GI. If you do not hear within 2 weeks, give [place] a call.   Never done   Zoster Vaccines- Shingrix (1 of 2) Please check with your pharmacy to see if they have the shingrix vaccine. If they do- please get this immunization and update [place] by phone call or mychart with dates you receive the vaccine  Never done   TETANUS/TDAP-recommend doing this at pharmacy 12/24/2005   DEXA SCAN -  Schedule your bone density test at check out desk. You may also call directly to X-ray at [phone] to schedule an appointment that is convenient for you.  - located [address] across the street from [place] - in the basement - you do need an appointment for the bone density tests.   Never done   Please stop by lab before you go If you have mychart- we will send your results within 3 business days of [place] receiving them.  If you do not have mychart- we will call you about results within 5 business days of [place] receiving them.  *please also note that you will see labs on mychart as soon as they post. I will later go in and write notes on them- will say "notes from Dr. Durene Cal"  Trial nexium 20 mg instead of 40 mg- if doesn't work can go back to 40 mg dose but let us know- try to give it at least 2-3 days if possible before switching back   We will call you within two weeks about your referral to ENT to evaluate hearing loss. If you do not hear within 2 weeks, give [place] a call.     Call cardiology to schedule follow-up.

## 2021-04-08 ENCOUNTER — Other Ambulatory Visit: Payer: Self-pay | Admitting: Family Medicine

## 2021-04-22 ENCOUNTER — Other Ambulatory Visit: Payer: Self-pay | Admitting: Family Medicine

## 2021-05-01 ENCOUNTER — Other Ambulatory Visit: Payer: Self-pay | Admitting: Family Medicine

## 2021-05-01 ENCOUNTER — Other Ambulatory Visit: Payer: Self-pay | Admitting: Cardiology

## 2021-07-10 ENCOUNTER — Ambulatory Visit (INDEPENDENT_AMBULATORY_CARE_PROVIDER_SITE_OTHER): Payer: Medicare Other | Admitting: Physician Assistant

## 2021-07-10 ENCOUNTER — Other Ambulatory Visit: Payer: Self-pay

## 2021-07-10 VITALS — BP 110/68 | HR 63 | Temp 98.0°F | Ht 66.0 in | Wt 155.2 lb

## 2021-07-10 DIAGNOSIS — M25532 Pain in left wrist: Secondary | ICD-10-CM | POA: Diagnosis not present

## 2021-07-10 DIAGNOSIS — R079 Chest pain, unspecified: Secondary | ICD-10-CM | POA: Diagnosis not present

## 2021-07-10 DIAGNOSIS — I499 Cardiac arrhythmia, unspecified: Secondary | ICD-10-CM | POA: Diagnosis not present

## 2021-07-10 DIAGNOSIS — E0590 Thyrotoxicosis, unspecified without thyrotoxic crisis or storm: Secondary | ICD-10-CM | POA: Diagnosis not present

## 2021-07-10 DIAGNOSIS — R739 Hyperglycemia, unspecified: Secondary | ICD-10-CM

## 2021-07-10 DIAGNOSIS — R339 Retention of urine, unspecified: Secondary | ICD-10-CM | POA: Diagnosis not present

## 2021-07-10 LAB — CBC WITH DIFFERENTIAL/PLATELET
Basophils Absolute: 0 10*3/uL (ref 0.0–0.1)
Basophils Relative: 0.9 % (ref 0.0–3.0)
Eosinophils Absolute: 0.2 10*3/uL (ref 0.0–0.7)
Eosinophils Relative: 6.5 % — ABNORMAL HIGH (ref 0.0–5.0)
HCT: 36.1 % (ref 36.0–46.0)
Hemoglobin: 12 g/dL (ref 12.0–15.0)
Lymphocytes Relative: 56.1 % — ABNORMAL HIGH (ref 12.0–46.0)
Lymphs Abs: 2.1 10*3/uL (ref 0.7–4.0)
MCHC: 33.1 g/dL (ref 30.0–36.0)
MCV: 96.1 fl (ref 78.0–100.0)
Monocytes Absolute: 0.3 10*3/uL (ref 0.1–1.0)
Monocytes Relative: 6.8 % (ref 3.0–12.0)
Neutro Abs: 1.1 10*3/uL — ABNORMAL LOW (ref 1.4–7.7)
Neutrophils Relative %: 29.7 % — ABNORMAL LOW (ref 43.0–77.0)
Platelets: 202 10*3/uL (ref 150.0–400.0)
RBC: 3.76 Mil/uL — ABNORMAL LOW (ref 3.87–5.11)
RDW: 13.9 % (ref 11.5–15.5)
WBC: 3.8 10*3/uL — ABNORMAL LOW (ref 4.0–10.5)

## 2021-07-10 LAB — POCT URINALYSIS DIPSTICK
Bilirubin, UA: NEGATIVE
Blood, UA: NEGATIVE
Glucose, UA: NEGATIVE
Ketones, UA: NEGATIVE
Leukocytes, UA: NEGATIVE
Nitrite, UA: POSITIVE
Protein, UA: POSITIVE — AB
Spec Grav, UA: 1.025 (ref 1.010–1.025)
Urobilinogen, UA: 1 E.U./dL
pH, UA: 5.5 (ref 5.0–8.0)

## 2021-07-10 LAB — HEMOGLOBIN A1C: Hgb A1c MFr Bld: 6.5 % (ref 4.6–6.5)

## 2021-07-10 NOTE — Progress Notes (Signed)
Established Patient Office Visit  Subjective:  Patient ID: [name], female    DOB: [birth year]  Age: 75 y.o. MRN: [id number]  CC:  Chief Complaint  Patient presents with   Thyroid Problem   Wrist Pain    HPI [name] patient with cardiac history presents because she feels like something is off with her body and she wants labs checked. Poor historian.  "Saturday I almost had a heart attack." States she was organizing some clothes and she felt heavy feeling in her chest, it went into her neck and left arm. She took 3 nitroglycerin and felt better, then took a 4th and says symptoms resolved completely. She did not call EMS. States she has been taking it easy since then. She did not call her cardiologist.  She does not have any chest pain or shortness of breath today.  She does not have any headaches or dizziness.  She is complaining of some urinary changes such as some decrease in urine output, but denies any dysuria or hematuria.  She also complains about some pain and swelling in the left wrist.  Past Medical History:  Diagnosis Date   Anemia 12/22/2017   Arthritis    Chronic lower back pain    Cluster headache    Coronary artery disease    a. 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 non obs dz LHC 2007. c. Non obs dz (mild-mod) by [facility] 05/17/14 d. cath 05/26/17 mild non obstructive CAD   COVID-19 08/07/2019   Diverticulitis    a. Hx microperf 2012 - hospitalizated.   GERD (gastroesophageal reflux disease)    Hypercholesteremia    Hypertension    Overactive bladder 09/19/2014   Oxybutynin 5mg  XL--> 10mg    PVC's (premature ventricular contractions)    a. Hx of trigeminy/bigeminy.   Seizures (HCC)    Thyroid disease    hyperthyroid   UTI (lower urinary tract infection)     Past Surgical History:  Procedure Laterality Date   ABDOMINAL HYSTERECTOMY  1987   "partial"-still has ovaries   CARDIAC CATHETERIZATION  05/17/2014   CORONARY ANGIOPLASTY WITH STENT PLACEMENT  1995   "1"    JOINT REPLACEMENT     right knee   LEFT HEART CATH AND CORONARY ANGIOGRAPHY N/A 05/26/2017   Procedure: Left Heart Cath and Coronary Angiography;  Surgeon: 05/19/2014, MD;  Location: [facility] INVASIVE CV LAB;  Service: Cardiovascular;  Laterality: N/A;   LEFT HEART CATHETERIZATION WITH CORONARY ANGIOGRAM N/A 05/17/2014   Procedure: LEFT HEART CATHETERIZATION WITH CORONARY ANGIOGRAM;  Surgeon: [facility], MD;  Location: [facility] CATH LAB;  Service: Cardiovascular;  Laterality: N/A;   TOTAL KNEE ARTHROPLASTY Right 2005   TUBAL LIGATION  1970   VASCULAR SURGERY      Family History  Problem Relation Age of Onset   CAD Brother    Diabetes Brother    CAD Father    Diabetes Father    Diabetes Mother        father, sister, brothers   Diabetes Sister    Thyroid disease Sister    Heart attack Brother    Prostate cancer Brother    Thyroid disease Sister    Diabetes Sister     Social History   Socioeconomic History   Marital status: Married    Spouse name: Not on file   Number of children: 4   Years of education: 4   Highest education level: Not on file  Occupational History    Comment: retired  Tobacco Use   Smoking status: Never   Smokeless tobacco: Never  Vaping Use   Vaping Use: Never used  Substance and Sexual Activity   Alcohol use: No   Drug use: No   Sexual activity: Never  Other Topics Concern   Not on file  Social History Narrative   Separated. 4 children from first marriage. 16 grandkids.       Retired from [employer] for 25 years, went to [employer] for 5 years. Retired 2003 after MI      Hobbies: babysit/active with children      Right-handed      Caffeine: 24 oz soda per day      Social Determinants of Corporate investment banker Strain: Low Risk    Difficulty of Paying Living Expenses: Not hard at all  Food Insecurity: No Food Insecurity   Worried About Programme researcher, broadcasting/film/video in the Last Year: Never true   Barista in the Last Year: Never true   Transportation Needs: No Transportation Needs   Lack of Transportation (Medical): No   Lack of Transportation (Non-Medical): No  Physical Activity: Inactive   Days of Exercise per Week: 0 days   Minutes of Exercise per Session: 0 min  Stress: No Stress Concern Present   Feeling of Stress : Not at all  Social Connections: Moderately Isolated   Frequency of Communication with Friends and Family: Three times a week   Frequency of Social Gatherings with Friends and Family: Never   Attends Religious Services: More than 4 times per year   Active Member of Clubs or Organizations: No   Attends Banker Meetings: Never   Marital Status: Widowed  Catering manager Violence: Not At Risk   Fear of Current or Ex-Partner: No   Emotionally Abused: No   Physically Abused: No   Sexually Abused: No    Outpatient Medications Prior to Visit  Medication Sig Dispense Refill   Aloe Vera 25 MG CAPS Take by mouth.     amLODipine (NORVASC) 10 MG tablet TAKE 1 TABLET(10 MG) BY MOUTH DAILY 90 tablet 3   ASPIRIN LOW DOSE PO Take 81 mg by mouth daily.     atorvastatin (LIPITOR) 40 MG tablet TAKE 1 TABLET(40 MG) BY MOUTH DAILY 90 tablet 3   Cholecalciferol (VITAMIN D3) 25 MCG (1000 UT) CAPS Take by mouth.     Cobalamin Combinations (NEURIVA PLUS) CAPS Take by mouth.     Cyanocobalamin (VITAMIN B-12) 3000 MCG SUBL Place 3,000 mg under the tongue daily.     esomeprazole (NEXIUM) 20 MG capsule Take 1 capsule (20 mg total) by mouth daily. 90 capsule 3   ezetimibe (ZETIA) 10 MG tablet TAKE 1 TABLET(10 MG) BY MOUTH DAILY 90 tablet 2   furosemide (LASIX) 20 MG tablet Take 1 tablet (20 mg total) by mouth daily. 90 tablet 3   isosorbide mononitrate (IMDUR) 120 MG 24 hr tablet TAKE 1 TABLET(120 MG) BY MOUTH DAILY 90 tablet 3   methimazole (TAPAZOLE) 10 MG tablet Take 1 tablet (10 mg total) by mouth daily. 90 tablet 1   metoprolol succinate (TOPROL-XL) 25 MG 24 hr tablet TAKE 1 TABLET(25 MG) BY MOUTH DAILY 90  tablet 1   nitroGLYCERIN (NITROSTAT) 0.4 MG SL tablet PLACE 1 TABLET UNDER THE TONGUE EVERY 5 MINUTES FOR CHEST PAIN FOR UPTO 3 DOSES 25 tablet 3   oxybutynin (DITROPAN-XL) 10 MG 24 hr tablet TAKE 1 TABLET BY MOUTH EVERY DAY 90 tablet  3   potassium chloride (MICRO-K) 10 MEQ CR capsule TAKE 1 CAPSULE BY MOUTH TWICE DAILY 180 capsule 3   Prenatal Vit-Fe Fumarate-FA (PRENATAL MULTIVITAMIN) TABS tablet Take 1 tablet by mouth daily at 12 noon.     traMADol (ULTRAM) 50 MG tablet TAKE 1 TABLET BY MOUTH EVERY 8 HOURS AS NEEDED FOR MODERATE OR SEVERE PAIN 90 tablet 3   venlafaxine XR (EFFEXOR-XR) 150 MG 24 hr capsule TAKE 1 CAPSULE BY MOUTH AT BEDTIME 30 capsule 5   vitamin C (VITAMIN C) 500 MG tablet Take 1 tablet (500 mg total) by mouth 2 (two) times daily. 14 tablet 0   Vitamin E 180 MG CAPS Take 2 capsules by mouth daily.     zinc sulfate 220 (50 Zn) MG capsule Take 1 capsule (220 mg total) by mouth daily. 7 capsule 0   No facility-administered medications prior to visit.    Allergies  Allergen Reactions   Crestor [Rosuvastatin] Other (See Comments)    Hot flashes and severe cramps   Sulfa Drugs Cross Reactors Shortness Of Breath and Palpitations    ROS Review of Systems REFER TO HPI FOR PERTINENT POSITIVES AND NEGATIVES    Objective:    Physical Exam Vitals and nursing note reviewed.  Constitutional:      General: She is not in acute distress.    Appearance: Normal appearance. She is normal weight.  HENT:     Head: Normocephalic.     Right Ear: Tympanic membrane, ear canal and external ear normal.     Left Ear: Tympanic membrane, ear canal and external ear normal.     Nose: Nose normal.     Mouth/Throat:     Mouth: Mucous membranes are moist.  Eyes:     Extraocular Movements: Extraocular movements intact.     Conjunctiva/sclera: Conjunctivae normal.     Pupils: Pupils are equal, round, and reactive to light.  Cardiovascular:     Rate and Rhythm: Normal rate. Rhythm irregular.      Pulses: Normal pulses.     Heart sounds: No murmur heard. Pulmonary:     Effort: Pulmonary effort is normal.     Breath sounds: Normal breath sounds.  Abdominal:     General: Abdomen is flat. Bowel sounds are normal.     Palpations: Abdomen is soft.     Tenderness: There is no abdominal tenderness. There is no right CVA tenderness or left CVA tenderness.  Musculoskeletal:        General: Normal range of motion.     Cervical back: Normal range of motion.     Comments: Swelling and pain left wrist diffusely  Skin:    General: Skin is warm.  Neurological:     General: No focal deficit present.     Mental Status: She is alert and oriented to person, place, and time.     Cranial Nerves: No cranial nerve deficit.     Gait: Gait normal.  Psychiatric:        Mood and Affect: Mood normal.        Behavior: Behavior normal.    BP 110/68   Pulse 63   Temp 98 F (36.7 C)   Ht 5\' 6"  (1.676 m)   Wt 155 lb 3.2 oz (70.4 kg)   SpO2 96%   BMI 25.05 kg/m  Wt Readings from Last 3 Encounters:  07/10/21 155 lb 3.2 oz (70.4 kg)  04/03/21 155 lb 12.8 oz (70.7 kg)  03/10/21 155 lb 12.8 oz (  70.7 kg)     Health Maintenance Due  Topic Date Due   COVID-19 Vaccine (1) Never done   Zoster Vaccines- Shingrix (1 of 2) Never done   COLONOSCOPY (Pts 45-72yrs Insurance coverage will need to be confirmed)  Never done   TETANUS/TDAP  12/24/2005   DEXA SCAN  Never done   INFLUENZA VACCINE  05/26/2021    There are no preventive care reminders to display for this patient.  Lab Results  Component Value Date   TSH 2.42 07/10/2021   Lab Results  Component Value Date   WBC 3.8 (L) 07/10/2021   HGB 12.0 07/10/2021   HCT 36.1 07/10/2021   MCV 96.1 07/10/2021   PLT 202.0 07/10/2021   Lab Results  Component Value Date   NA 138 07/10/2021   K 4.0 07/10/2021   CO2 28 07/10/2021   GLUCOSE 75 07/10/2021   BUN 17 07/10/2021   CREATININE 1.12 07/10/2021   BILITOT 0.6 07/10/2021   ALKPHOS 69  07/10/2021   AST 22 07/10/2021   ALT 18 07/10/2021   PROT 7.2 07/10/2021   ALBUMIN 4.0 07/10/2021   CALCIUM 9.2 07/10/2021   ANIONGAP 8 03/30/2021   GFR 48.38 (L) 07/10/2021   Lab Results  Component Value Date   CHOL 151 04/03/2021   Lab Results  Component Value Date   HDL 45.30 04/03/2021   Lab Results  Component Value Date   LDLCALC 88 04/03/2021   Lab Results  Component Value Date   TRIG 88.0 04/03/2021   Lab Results  Component Value Date   CHOLHDL 3 04/03/2021   Lab Results  Component Value Date   HGBA1C 6.5 07/10/2021      Assessment & Plan:   Problem List Items Addressed This Visit       Endocrine   Hyperthyroidism   Relevant Orders   TSH (Completed)   T4, free (Completed)     Other   Hyperglycemia   Relevant Orders   CBC with Differential/Platelet (Completed)   Comprehensive metabolic panel (Completed)   Hemoglobin A1c (Completed)   Chest pain - Primary   Relevant Orders   EKG 12-Lead (Completed)   CBC with Differential/Platelet (Completed)   Other Visit Diagnoses     Irregular heart beat       Relevant Orders   EKG 12-Lead (Completed)   CBC with Differential/Platelet (Completed)   Urinary retention       Relevant Orders   CBC with Differential/Platelet (Completed)   POCT urinalysis dipstick (Completed)   Urine Culture (Completed)      1. Chest pain, unspecified type 2. Irregular heart beat EKG performed in office today shows no acute changes, very comparable to her previous EKG.  Looking back on her history it looks like these complaints are not uncommon for her.  She is going to contact her cardiologist for follow-up.  She understands to go to the emergency department in the case of any sudden chest pain or shortness of breath.  3. Urinary retention Urinalysis and urine culture checked today.  4. Hyperthyroidism She has a history of hyperthyroidism.  We will check labs today.  5. Hyperglycemia Again, will check labs and treat  accordingly.  6. Acute left wrist pain Minor complaint compared to her other issues going on today.  She does want this looked at further and I suggested a referral to sports medicine.   Follow-up: No follow-ups on file.   This note was prepared with assistance of Conservation officer, historic buildings. Occasional  wrong-word or sound-a-like substitutions may have occurred due to the inherent limitations of voice recognition software.   Corinthia Helmers M Abree Romick, PA-C

## 2021-07-10 NOTE — Patient Instructions (Signed)
Good to meet you today! Please go to the lab for blood work and I will send results through MyChart. Your EKG looks consistent with your previous EKG. Please contact your cardiologist about your incident on Saturday.  Report to the ED should you have any further symptoms. Drink plenty of fluids and rest as you are able.

## 2021-07-11 LAB — COMPREHENSIVE METABOLIC PANEL
ALT: 18 U/L (ref 0–35)
AST: 22 U/L (ref 0–37)
Albumin: 4 g/dL (ref 3.5–5.2)
Alkaline Phosphatase: 69 U/L (ref 39–117)
BUN: 17 mg/dL (ref 6–23)
CO2: 28 mEq/L (ref 19–32)
Calcium: 9.2 mg/dL (ref 8.4–10.5)
Chloride: 104 mEq/L (ref 96–112)
Creatinine, Ser: 1.12 mg/dL (ref 0.40–1.20)
GFR: 48.38 mL/min — ABNORMAL LOW (ref >60.00)
Glucose, Bld: 75 mg/dL (ref 70–99)
Potassium: 4 mEq/L (ref 3.5–5.1)
Sodium: 138 mEq/L (ref 135–145)
Total Bilirubin: 0.6 mg/dL (ref 0.2–1.2)
Total Protein: 7.2 g/dL (ref 6.0–8.3)

## 2021-07-11 LAB — TSH: TSH: 2.42 u[IU]/mL (ref 0.35–5.50)

## 2021-07-11 LAB — T4, FREE: Free T4: 0.67 ng/dL (ref 0.60–1.60)

## 2021-07-12 LAB — URINE CULTURE
MICRO NUMBER:: 12379175
SPECIMEN QUALITY:: ADEQUATE

## 2021-07-15 ENCOUNTER — Ambulatory Visit: Payer: Medicare Other | Admitting: Endocrinology

## 2021-07-15 ENCOUNTER — Other Ambulatory Visit: Payer: Self-pay

## 2021-07-15 MED ORDER — NITROFURANTOIN MONOHYD MACRO 100 MG PO CAPS: 100.0000 mg | ORAL_CAPSULE | Freq: Two times a day (BID) | ORAL | 0 refills | Status: AC

## 2021-07-17 ENCOUNTER — Encounter: Payer: Self-pay | Admitting: Orthopaedic Surgery

## 2021-07-17 ENCOUNTER — Ambulatory Visit (INDEPENDENT_AMBULATORY_CARE_PROVIDER_SITE_OTHER): Payer: Medicare Other | Admitting: Orthopaedic Surgery

## 2021-07-17 ENCOUNTER — Other Ambulatory Visit: Payer: Self-pay

## 2021-07-17 ENCOUNTER — Ambulatory Visit: Payer: Self-pay

## 2021-07-17 DIAGNOSIS — M25532 Pain in left wrist: Secondary | ICD-10-CM | POA: Insufficient documentation

## 2021-07-17 NOTE — Progress Notes (Signed)
ogr  Office Visit Note   Patient: [name]           Date of Birth: [birth year]           MRN: [id number] Visit Date: 07/17/2021              Requested by: Allwardt, Crist Infante, PA-C [address] [place],  [place] [address] PCP: Shelva Majestic, MD   Assessment & Plan: Visit Diagnoses:  1. Pain in left wrist     Plan: [name] is been experiencing left wrist pain for approximately 1 year.  She denies any history of injury or trauma.  She notes that it "hurts with use" does not seem to have any numbness or tingling or pain at rest.  She is right-hand dominant.  She has had some swelling.  She is tried some over-the-counter medicines and even had tramadol at home which seems to give her some temporary relief.  X-rays were nondiagnostic.  Her pain is localized along the ulnar aspect of the wrist.  She could have a TFCC tear.  I think it is worth obtaining an MRI arthrogram.  She did not want to consider cortisone injection.  She has been wearing a brace  Follow-Up Instructions: Return After MRI arthrogram left wrist.   Orders:  Orders Placed This Encounter  Procedures   XR Wrist Complete Left   Arthrogram   MR Wrist Left w/ contrast   No orders of the defined types were placed in this encounter.     Procedures: No procedures performed   Clinical Data: No additional findings.   Subjective: Chief Complaint  Patient presents with   Left Wrist - Pain  Patient presents today for left wrist pain. She said that it has been progressively getting worse for the last year. She said that it hurts more anytime she tries to use it. She takes Tramadol and Tylenol for pain relief. She is right hand dominant.  Has experienced some swelling particularly with use.  No numbness or tingling.  Pain is predominantly in the dorsal of the wrist and volarly  HPI  Review of Systems   Objective: Vital Signs: There were no vitals taken for this visit.  Physical Exam Constitutional:       Appearance: She is well-developed.  Pulmonary:     Effort: Pulmonary effort is normal.  Skin:    General: Skin is warm and dry.  Neurological:     Mental Status: She is alert and oriented to person, place, and time.  Psychiatric:        Behavior: Behavior normal.    Ortho Exam left wrist.  There was a little swelling in the dorsum of the wrist and, perhaps, a little more than on the right dominant side.  No tenderness.  There was no tenderness over the distal radius.  Very minimal tenderness over the distal ulna and the ulnar collateral ligament but no instability.  There was some tenderness over the ulnocarpal joint but no popping or clicking.  Full range of motion of the wrist.  Negative Tinel's over the median nerve.  No pain along the first dorsal extensor compartment.  Neurovascular exam intact  Specialty Comments:  No specialty comments available.  Imaging: XR Wrist Complete Left  Result Date: 07/17/2021 Films of the left wrist were obtained in 3 projections.  There is no ulnar variance.  No ectopic calcification or acute changes.  No obvious arthritic changes in the carpus or even at the base  of the thumb.  Patient experiencing pain along the ulnar aspect of the wrist without trauma but nothing demonstrated by film    PMFS History: Patient Active Problem List   Diagnosis Date Noted   Pain in left wrist 07/17/2021   WPW (Wolff-Parkinson-White syndrome) 03/05/2020   Chest pain 02/03/2020   Aortic atherosclerosis (HCC) 01/09/2020   Heart failure with reduced ejection fraction (HCC) 08/21/2019   COVID-19 08/07/2019   Anemia 12/22/2017   Hyperthyroidism 05/22/2017   Overactive bladder 09/19/2014   Migraine 09/19/2014   Hyperglycemia 09/19/2014   Hypokalemia 06/01/2014   Coronary artery disease involving native coronary artery of native heart with angina pectoris (HCC)    Seizures (HCC)    Arthritis    Hypercholesteremia    GERD (gastroesophageal reflux disease)  10/04/2011   HTN (hypertension) 10/01/2011   Past Medical History:  Diagnosis Date   Anemia 12/22/2017   Arthritis    Chronic lower back pain    Cluster headache    Coronary artery disease    a. 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 non obs dz LHC 2007. c. Non obs dz (mild-mod) by [facility] 05/17/14 d. cath 05/26/17 mild non obstructive CAD   COVID-19 08/07/2019   Diverticulitis    a. Hx microperf 2012 - hospitalizated.   GERD (gastroesophageal reflux disease)    Hypercholesteremia    Hypertension    Overactive bladder 09/19/2014   Oxybutynin 5mg  XL--> 10mg    PVC's (premature ventricular contractions)    a. Hx of trigeminy/bigeminy.   Seizures (HCC)    Thyroid disease    hyperthyroid   UTI (lower urinary tract infection)     Family History  Problem Relation Age of Onset   CAD Brother    Diabetes Brother    CAD Father    Diabetes Father    Diabetes Mother        father, sister, brothers   Diabetes Sister    Thyroid disease Sister    Heart attack Brother    Prostate cancer Brother    Thyroid disease Sister    Diabetes Sister     Past Surgical History:  Procedure Laterality Date   ABDOMINAL HYSTERECTOMY  1987   "partial"-still has ovaries   CARDIAC CATHETERIZATION  05/17/2014   CORONARY ANGIOPLASTY WITH STENT PLACEMENT  1995   "1"   JOINT REPLACEMENT     right knee   LEFT HEART CATH AND CORONARY ANGIOGRAPHY N/A 05/26/2017   Procedure: Left Heart Cath and Coronary Angiography;  Surgeon: 05/19/2014, MD;  Location[facility] INVASIVE CV LAB;  Service: Cardiovascular;  Laterality: N/A;   LEFT HEART CATHETERIZATION WITH CORONARY ANGIOGRAM N/A 05/17/2014   Procedure: LEFT HEART CATHETERIZATION WITH CORONARY ANGIOGRAM;  Surgeon: [facility], MD;  Location: [facility] CATH LAB;  Service: Cardiovascular;  Laterality: N/A;   TOTAL KNEE ARTHROPLASTY Right 2005   TUBAL LIGATION  1970   VASCULAR SURGERY     Social History   Occupational History    Comment: retired  Tobacco Use   Smoking status:  Never   Smokeless tobacco: Never  Vaping Use   Vaping Use: Never used  Substance and Sexual Activity   Alcohol use: No   Drug use: No   Sexual activity: Never

## 2021-07-29 ENCOUNTER — Other Ambulatory Visit: Payer: Self-pay

## 2021-07-29 ENCOUNTER — Ambulatory Visit (INDEPENDENT_AMBULATORY_CARE_PROVIDER_SITE_OTHER): Payer: Medicare Other | Admitting: Pharmacist

## 2021-07-29 ENCOUNTER — Telehealth: Payer: Self-pay | Admitting: Pharmacist

## 2021-07-29 DIAGNOSIS — E0590 Thyrotoxicosis, unspecified without thyrotoxic crisis or storm: Secondary | ICD-10-CM

## 2021-07-29 DIAGNOSIS — E7800 Pure hypercholesterolemia, unspecified: Secondary | ICD-10-CM

## 2021-07-29 DIAGNOSIS — K219 Gastro-esophageal reflux disease without esophagitis: Secondary | ICD-10-CM

## 2021-07-29 DIAGNOSIS — I5020 Unspecified systolic (congestive) heart failure: Secondary | ICD-10-CM

## 2021-07-29 DIAGNOSIS — I10 Essential (primary) hypertension: Secondary | ICD-10-CM

## 2021-07-29 NOTE — Progress Notes (Signed)
Chronic Care Management Pharmacy Note  07/29/2021 Name:  [name] MRN:  [id number] DOB:  Feb 24, 1946  Summary: Initial visit with PharmD.  Patient is taking all of her meds together at night.  Complains of fatigue that does seem to be improving with normalized TSH.  Also complains of symptoms of acid in her stomach/throat.  Recommendations/Changes made from today's visit: Recommend she change Nexium to morning dosing separate from other meds on empty stomach, see if this improves symptoms.  Patient is due for DEXA scan and colonoscopy  Plan: FU 4 months   Subjective: [name] is an 75 y.o. year old female who is a primary patient of Hunter, Brayton Mars, MD.  The CCM team was consulted for assistance with disease management and care coordination needs.    Engaged with patient face to face for initial visit in response to provider referral for pharmacy case management and/or care coordination services.   Consent to Services:  The patient was given the following information about Chronic Care Management services today, agreed to services, and gave verbal consent: 1. CCM service includes personalized support from designated clinical staff supervised by the primary care provider, including individualized plan of care and coordination with other care providers 2. 24/7 contact phone numbers for assistance for urgent and routine care needs. 3. Service will only be billed when office clinical staff spend 20 minutes or more in a month to coordinate care. 4. Only one practitioner may furnish and bill the service in a calendar month. 5.The patient may stop CCM services at any time (effective at the end of the month) by phone call to the office staff. 6. The patient will be responsible for cost sharing (co-pay) of up to 20% of the service fee (after annual deductible is met). Patient agreed to services and consent obtained.  Patient Care Team: Marin Olp, MD as PCP - General (Family  Medicine) Belva Crome, MD as PCP - Cardiology (Cardiology) Edythe Clarity, [facility] (Pharmacist)  Recent office visits:  07/10/2021 OV Alyssa Allwardt, PA-C; EKG performed in office today shows no acute changes, very comparable to her previous EKG.  Looking back on her history it looks like these complaints are not uncommon for her.  She is going to contact her cardiologist for follow-up.    04/03/2021 OV (PCP) Marin Olp, MD; Suspected mild poor control-patient has not had a chance to reduce from 2 pills to 1 pill per day of methimazole 10 mg (in the past has been as high as 30 mg)- she agrees to make this adjustment today.    Recent consult visits:  07/17/2021 OV (orthopedics) Garald Balding, MD; pain in left wrist, I think it is worth obtaining an MRI - no medication changes indicated.   03/10/2021 )V (endocrinology) Renato Shin, MD; Hyperthyroidism: overcontrolled.  reduce the methimazole to 10 mg per day.   Hospital visits:  03/30/2021 ED Visit for unspecified chest pain and weakness,  Cardiac workup initiated. No medication changes indicated.   Objective:  Lab Results  Component Value Date   CREATININE 1.12 07/10/2021   BUN 17 07/10/2021   GFR 48.38 (L) 07/10/2021   GFRNONAA 51 (L) 03/30/2021   GFRAA 56 (L) 02/04/2020   NA 138 07/10/2021   K 4.0 07/10/2021   CALCIUM 9.2 07/10/2021   CO2 28 07/10/2021   GLUCOSE 75 07/10/2021    Lab Results  Component Value Date/Time   HGBA1C 6.5 07/10/2021 02:28 PM   HGBA1C 6.6 (H) 03/10/2021  03:26 PM   GFR 48.38 (L) 07/10/2021 02:28 PM   GFR 54.87 (L) 04/03/2021 02:24 PM    Last diabetic Eye exam: No results found for: HMDIABEYEEXA  Last diabetic Foot exam: No results found for: HMDIABFOOTEX   Lab Results  Component Value Date   CHOL 151 04/03/2021   HDL 45.30 04/03/2021   LDLCALC 88 04/03/2021   LDLDIRECT 68 11/29/2020   TRIG 88.0 04/03/2021   CHOLHDL 3 04/03/2021    Hepatic Function Latest Ref Rng & Units  07/10/2021 04/03/2021 11/29/2020  Total Protein 6.0 - 8.3 g/dL 7.2 7.4 7.2  Albumin 3.5 - 5.2 g/dL 4.0 4.1 -  AST 0 - 37 U/L 22 18 21   ALT 0 - 35 U/L 18 16 15   Alk Phosphatase 39 - 117 U/L 69 71 -  Total Bilirubin 0.2 - 1.2 mg/dL 0.6 0.9 0.8  Bilirubin, Direct 0.0 - 0.2 mg/dL - - -    Lab Results  Component Value Date/Time   TSH 2.42 07/10/2021 02:28 PM   TSH 10.78 (H) 03/10/2021 03:26 PM   FREET4 0.67 07/10/2021 02:28 PM   FREET4 0.71 03/10/2021 03:26 PM    CBC Latest Ref Rng & Units 07/10/2021 04/03/2021 03/30/2021  WBC 4.0 - 10.5 K/uL 3.8(L) 3.9(L) 4.8  Hemoglobin 12.0 - 15.0 g/dL 12.0 12.7 12.8  Hematocrit 36.0 - 46.0 % 36.1 37.7 38.6  Platelets 150.0 - 400.0 K/uL 202.0 213.0 250    No results found for: VD25OH  Clinical ASCVD: Yes  The 10-year ASCVD risk score (Arnett DK, et al., 2019) is: 8.5%   Values used to calculate the score:     Age: 75 years     Sex: Female     Is Non-Hispanic African American: Yes     Diabetic: No     Tobacco smoker: No     Systolic Blood Pressure: 981 mmHg     Is BP treated: Yes     HDL Cholesterol: 45.3 mg/dL     Total Cholesterol: 151 mg/dL    Depression screen [facility] 2/9 02/06/2021 11/29/2020 02/14/2020  Decreased Interest 0 0 0  Down, Depressed, Hopeless 0 0 0  PHQ - 2 Score 0 0 0  Altered sleeping - - 2  Tired, decreased energy - - 2  Change in appetite - - 0  Feeling bad or failure about yourself  - - 0  Trouble concentrating - - 0  Moving slowly or fidgety/restless - - 0  Suicidal thoughts - - 0  PHQ-9 Score - - 4  Difficult doing work/chores - - Not difficult at all  Some recent data might be hidden     Social History   Tobacco Use  Smoking Status Never  Smokeless Tobacco Never   BP Readings from Last 3 Encounters:  07/10/21 110/68  04/03/21 118/64  03/30/21 125/71   Pulse Readings from Last 3 Encounters:  07/10/21 63  04/03/21 67  03/30/21 63   Wt Readings from Last 3 Encounters:  07/10/21 155 lb 3.2 oz (70.4 kg)   04/03/21 155 lb 12.8 oz (70.7 kg)  03/10/21 155 lb 12.8 oz (70.7 kg)   BMI Readings from Last 3 Encounters:  07/10/21 25.05 kg/m  04/03/21 25.15 kg/m  03/10/21 25.15 kg/m    Assessment/Interventions: Review of patient past medical history, allergies, medications, health status, including review of consultants reports, laboratory and other test data, was performed as part of comprehensive evaluation and provision of chronic care management services.   SDOH:  (Social Determinants  of Health) assessments and interventions performed: Yes  Financial Resource Strain: Low Risk    Difficulty of Paying Living Expenses: Not hard at all    SDOH Screenings   Alcohol Screen: Not on file  Depression (PHQ2-9): Low Risk    PHQ-2 Score: 0  Financial Resource Strain: Low Risk    Difficulty of Paying Living Expenses: Not hard at all  Food Insecurity: No Food Insecurity   Worried About Charity fundraiser in the Last Year: Never true   Ran Out of Food in the Last Year: Never true  Housing: Low Risk    Last Housing Risk Score: 0  Physical Activity: Inactive   Days of Exercise per Week: 0 days   Minutes of Exercise per Session: 0 min  Social Connections: Moderately Isolated   Frequency of Communication with Friends and Family: Three times a week   Frequency of Social Gatherings with Friends and Family: Never   Attends Religious Services: More than 4 times per year   Active Member of Clubs or Organizations: No   Attends Archivist Meetings: Never   Marital Status: Widowed  Stress: No Stress Concern Present   Feeling of Stress : Not at all  Tobacco Use: Low Risk    Smoking Tobacco Use: Never   Smokeless Tobacco Use: Never  Transportation Needs: No Transportation Needs   Lack of Transportation (Medical): No   Lack of Transportation (Non-Medical): No    CCM Care Plan  Allergies  Allergen Reactions   Crestor [Rosuvastatin] Other (See Comments)    Hot flashes and severe cramps    Sulfa Drugs Cross Reactors Shortness Of Breath and Palpitations    Medications Reviewed Today     Reviewed by Edythe Clarity, [facility] (Pharmacist) on 07/29/21 at [place] List Status: <None>   Medication Order Taking? Sig Documenting Provider Last Dose Status Informant  Aloe Vera 25 MG CAPS [id number] Yes Take by mouth. [provider] Taking Active   amLODipine (NORVASC) 10 MG tablet [id number] Yes TAKE 1 TABLET(10 MG) BY MOUTH DAILY Marin Olp, MD Taking Active   ASPIRIN LOW DOSE PO [id number] Yes Take 81 mg by mouth daily. [provider] Taking Active Self  atorvastatin (LIPITOR) 40 MG tablet [id number] Yes TAKE 1 TABLET(40 MG) BY MOUTH DAILY Marin Olp, MD Taking Active   Cholecalciferol (VITAMIN D3) 25 MCG (1000 UT) CAPS [id number] Yes Take by mouth. [provider] Taking Active   Cobalamin Combinations ([place]) CAPS [id number] Yes Take by mouth. [provider] Taking Active   Cyanocobalamin (VITAMIN B-12) 3000 MCG SUBL [id number] Yes Place 3,000 mg under the tongue daily. [provider] Taking Active   esomeprazole (NEXIUM) 20 MG capsule [id number] Yes Take 1 capsule (20 mg total) by mouth daily. Marin Olp, MD Taking Active   ezetimibe (ZETIA) 10 MG tablet [id number] Yes TAKE 1 TABLET(10 MG) BY MOUTH DAILY Marin Olp, MD Taking Active   furosemide (LASIX) 20 MG tablet [id number] Yes Take 1 tablet (20 mg total) by mouth daily. Thompson Grayer, MD Taking Active   isosorbide mononitrate (IMDUR) 120 MG 24 hr tablet [id number] Yes TAKE 1 TABLET(120 MG) BY MOUTH DAILY Tommie Raymond, NP Taking Active   methimazole (TAPAZOLE) 10 MG tablet [id number] Yes Take 1 tablet (10 mg total) by mouth daily. Renato Shin, MD Taking Active   metoprolol succinate (TOPROL-XL) 25 MG 24 hr tablet [id number] Yes TAKE 1 TABLET(25 MG) BY MOUTH DAILY Thompson Grayer, MD  Taking Active   nitroGLYCERIN (NITROSTAT) 0.4 MG SL tablet [id number] Yes  PLACE 1 TABLET UNDER THE TONGUE EVERY 5 MINUTES FOR CHEST PAIN FOR UPTO 3 DOSES Allred, James, MD Taking Active   oxybutynin (DITROPAN-XL) 10 MG 24 hr tablet [id number] Yes TAKE 1 TABLET BY MOUTH EVERY DAY Marin Olp, MD Taking Active   potassium chloride (MICRO-K) 10 MEQ CR capsule [id number] Yes TAKE 1 CAPSULE BY MOUTH TWICE DAILY Marin Olp, MD Taking Active   Prenatal Vit-Fe Fumarate-FA (PRENATAL MULTIVITAMIN) TABS tablet [id number] Yes Take 1 tablet by mouth daily at 12 noon. [provider] Taking Active Self  traMADol (ULTRAM) 50 MG tablet [id number] Yes TAKE 1 TABLET BY MOUTH EVERY 8 HOURS AS NEEDED FOR MODERATE OR SEVERE PAIN Marin Olp, MD Taking Active   venlafaxine XR (EFFEXOR-XR) 150 MG 24 hr capsule [id number] Yes TAKE 1 CAPSULE BY MOUTH AT BEDTIME Marin Olp, MD Taking Active   vitamin C (VITAMIN C) 500 MG tablet [id number] Yes Take 1 tablet (500 mg total) by mouth 2 (two) times daily. Shelly Coss, MD Taking Active Self  Vitamin E 180 MG CAPS [id number] Yes Take 2 capsules by mouth daily. [provider] Taking Active Self  zinc sulfate 220 (50 Zn) MG capsule [id number] Yes Take 1 capsule (220 mg total) by mouth daily. Shelly Coss, MD Taking Active Self            Patient Active Problem List   Diagnosis Date Noted   Pain in left wrist 07/17/2021   WPW (Wolff-Parkinson-White syndrome) 03/05/2020   Chest pain 02/03/2020   Aortic atherosclerosis ([place]) 01/09/2020   Heart failure with reduced ejection fraction ([place]) 08/21/2019   COVID-19 08/07/2019   Anemia 12/22/2017   Hyperthyroidism 05/22/2017   Overactive bladder 09/19/2014   Migraine 09/19/2014   Hyperglycemia 09/19/2014   Hypokalemia 06/01/2014   Coronary artery disease involving native coronary artery of native heart with angina pectoris (HCC)    Seizures ([place])    Arthritis    Hypercholesteremia    GERD (gastroesophageal reflux disease) 10/04/2011   HTN (hypertension)  10/01/2011    Immunization History  Administered Date(s) Administered   Influenza, High Dose Seasonal PF 08/17/2016, 08/06/2017   Influenza,inj,Quad PF,6+ Mos 10/11/2015   Pneumococcal Conjugate-13 10/11/2015   Pneumococcal Polysaccharide-23 11/19/2016   Td 12/25/1995    Conditions to be addressed/monitored:  HTN, HFrEF, CAD, GERD, Hyperthyroidism, HLD, Anemia  Care Plan : General Pharmacy (Adult)  Updates made by Edythe Clarity, RPH since 07/29/2021 12:00 AM     Problem: HTN, HFrEF, CAD, GERD, Hyperthyroidism, HLD, Anemia   Priority: High  Onset Date: 07/29/2021     Long-Range Goal: Patient-Specific Goal   Start Date: 07/29/2021  Expected End Date: 01/27/2022  This Visit's Progress: On track  Priority: High  Note:   Current Barriers:  Unable to independently monitor therapeutic efficacy  Pharmacist Clinical Goal(s):  Patient will maintain control of TSH and BP as evidenced by monitoring/labs  through collaboration with PharmD and provider.   Interventions: 1:1 collaboration with Marin Olp, MD regarding development and update of comprehensive plan of care as evidenced by provider attestation and co-signature Inter-disciplinary care team collaboration (see longitudinal plan of care) Comprehensive medication review performed; medication list updated in electronic medical record  Hypertension (BP goal <130/80) -Controlled -Current treatment: Amlodipine 62m daily Isosorbide 1278m24hr daily Metoprolol Xl 2567maily -Medications previously tried: propronolol  -Current home readings: not checking -Current exercise habits: very active around the  house, "always moving" no organized physical activity -Reports hypotensive/hypertensive symptoms - dizziness occasionally at random times -Educated on BP goals and benefits of medications for prevention of heart attack, stroke and kidney damage; Importance of home blood pressure monitoring; Symptoms of hypotension and  importance of maintaining adequate hydration; -Counseled to monitor BP at home as able, document, and provide log at future appointments -Recommended to continue current medication Report dizziness to providers  Hyperlipidemia: (LDL goal < 70) -Controlled -Current treatment: Atorvastatin 83m Zetia 171mdaily -Medications previously tried: Rosuvastatin (cramps)  -Educated on Cholesterol goals;  Benefits of statin for ASCVD risk reduction; Importance of limiting foods high in cholesterol; Most recent LDL is controlled -Recommended to continue current medication  Heart Failure (Goal: manage symptoms and prevent exacerbations) -Controlled -Last ejection fraction: 55-60%  -Current treatment: Furosemide 2076maily Metoprolol XL 44m83mily Imdur 120mg70mr daily -Medications previously tried: none noted  -Current home BP/HR readings: not checking, normal in office -Educated on Benefits of medications for managing symptoms and prolonging life Importance of weighing daily; if you gain more than 3 pounds in one day or 5 pounds in one week, contact providers Proper diuretic administration and potassium supplementation -Recommended to continue current medication  GERD (Goal: Control symptoms) -Not ideally controlled -Current treatment  Esomeprazole 20mg 32my -Medications previously tried: none noted -She is taking this medication at night with all of her other medications.  She mentions having symptoms and is having to take "gas pills" to resolve these/  -Recommended to continue current medication Recommend she take medication in the am on empty stomach separate from other meds.  See if this helps symptoms.  Hyperthyroidism (Goal: Maintain TSH) -Controlled -Current treatment  Methimazole 10mg d36m -Medications previously tried: none ntoed -TSH now WNL.  She reports her energy level seems to be improving.  Confirmed she was on the correct dose of methimazole and discussed  adherence.   -Recommended to continue current medication  Anemia (Goal: Maintain Hgb) -Controlled -Current treatment  None noted -Medications previously tried: none noted  -Last Hgb is normal at 12.0 -Recommended to continue current medication  Patient Goals/Self-Care Activities Patient will:  - take medications as prescribed focus on medication adherence by pill counts check blood pressure periodically, document, and provide at future appointments  Follow Up Plan: The care management team will reach out to the patient again over the next 120 days.         Medication Assistance: None required.  Patient affirms current coverage meets needs.  Compliance/Adherence/Medication fill history: Care Gaps: DEXA Scan Colonoscopy   Patient's preferred pharmacy is:  [id number] [address] [place] [place] [phone] box? No - sets them out each night Pt endorses 100% compliance  We discussed: Benefits of medication synchronization, packaging and delivery as well as enhanced pharmacist oversight with [employer]. Patient decided to: Continue current medication management strategy  Care Plan and Follow Up Patient Decision:  Patient agrees to Care Plan and Follow-up.  Plan: The care management team will reach out to the patient again over the next 120 days.  ChristiBeverly MilchD Clinical Pharmacist [phone]

## 2021-07-29 NOTE — Patient Instructions (Addendum)
Visit Information   Goals Addressed             This Visit's Progress    Track and Manage My Blood Pressure-Hypertension       Timeframe:  Long-Range Goal Priority:  High Start Date:   07/29/21                          Expected End Date: 01/27/22                      Follow Up Date 10/29/21    - check blood pressure weekly - choose a place to take my blood pressure (home, clinic or office, retail store) - write blood pressure results in a log or diary    Why is this important?   You won't feel high blood pressure, but it can still hurt your blood vessels.  High blood pressure can cause heart or kidney problems. It can also cause a stroke.  Making lifestyle changes like losing a little weight or eating less salt will help.  Checking your blood pressure at home and at different times of the day can help to control blood pressure.  If the doctor prescribes medicine remember to take it the way the doctor ordered.  Call the office if you cannot afford the medicine or if there are questions about it.     Notes:        Patient Care Plan: General Pharmacy (Adult)     Problem Identified: HTN, HFrEF, CAD, GERD, Hyperthyroidism, HLD, Anemia   Priority: High  Onset Date: 07/29/2021     Long-Range Goal: Patient-Specific Goal   Start Date: 07/29/2021  Expected End Date: 01/27/2022  This Visit's Progress: On track  Priority: High  Note:   Current Barriers:  Unable to independently monitor therapeutic efficacy  Pharmacist Clinical Goal(s):  Patient will maintain control of TSH and BP as evidenced by monitoring/labs  through collaboration with PharmD and provider.   Interventions: 1:1 collaboration with Shelva Majestic, MD regarding development and update of comprehensive plan of care as evidenced by provider attestation and co-signature Inter-disciplinary care team collaboration (see longitudinal plan of care) Comprehensive medication review performed; medication list updated  in electronic medical record  Hypertension (BP goal <130/80) -Controlled -Current treatment: Amlodipine 10mg  daily Isosorbide 120mg  24hr daily Metoprolol Xl 25mg  daily -Medications previously tried: propronolol  -Current home readings: not checking -Current exercise habits: very active around the house, "always moving" no organized physical activity -Reports hypotensive/hypertensive symptoms - dizziness occasionally at random times -Educated on BP goals and benefits of medications for prevention of heart attack, stroke and kidney damage; Importance of home blood pressure monitoring; Symptoms of hypotension and importance of maintaining adequate hydration; -Counseled to monitor BP at home as able, document, and provide log at future appointments -Recommended to continue current medication Report dizziness to providers  Hyperlipidemia: (LDL goal < 70) -Controlled -Current treatment: Atorvastatin 40mg  Zetia 10mg  daily -Medications previously tried: Rosuvastatin (cramps)  -Educated on Cholesterol goals;  Benefits of statin for ASCVD risk reduction; Importance of limiting foods high in cholesterol; Most recent LDL is controlled -Recommended to continue current medication  Heart Failure (Goal: manage symptoms and prevent exacerbations) -Controlled -Last ejection fraction: 55-60%  -Current treatment: Furosemide 20mg  daily Metoprolol XL 25mg  daily Imdur 120mg  24hr daily -Medications previously tried: none noted  -Current home BP/HR readings: not checking, normal in office -Educated on Benefits of medications for managing symptoms and  prolonging life Importance of weighing daily; if you gain more than 3 pounds in one day or 5 pounds in one week, contact providers Proper diuretic administration and potassium supplementation -Recommended to continue current medication  GERD (Goal: Control symptoms) -Not ideally controlled -Current treatment  Esomeprazole 20mg  daily -Medications  previously tried: none noted -She is taking this medication at night with all of her other medications.  She mentions having symptoms and is having to take "gas pills" to resolve these/  -Recommended to continue current medication Recommend she take medication in the am on empty stomach separate from other meds.  See if this helps symptoms.  Hyperthyroidism (Goal: Maintain TSH) -Controlled -Current treatment  Methimazole 10mg  daily -Medications previously tried: none ntoed -TSH now WNL.  She reports her energy level seems to be improving.  Confirmed she was on the correct dose of methimazole and discussed adherence.   -Recommended to continue current medication  Anemia (Goal: Maintain Hgb) -Controlled -Current treatment  None noted -Medications previously tried: none noted  -Last Hgb is normal at 12.0 -Recommended to continue current medication  Patient Goals/Self-Care Activities Patient will:  - take medications as prescribed focus on medication adherence by pill counts check blood pressure periodically, document, and provide at future appointments  Follow Up Plan: The care management team will reach out to the patient again over the next 120 days.        Ms. Magana was given information about Chronic Care Management services today including:  CCM service includes personalized support from designated clinical staff supervised by her physician, including individualized plan of care and coordination with other care providers 24/7 contact phone numbers for assistance for urgent and routine care needs. Standard insurance, coinsurance, copays and deductibles apply for chronic care management only during months in which we provide at least 20 minutes of these services. Most insurances cover these services at 100%, however patients may be responsible for any copay, coinsurance and/or deductible if applicable. This service may help you avoid the need for more expensive face-to-face  services. Only one practitioner may furnish and bill the service in a calendar month. The patient may stop CCM services at any time (effective at the end of the month) by phone call to the office staff.  Patient agreed to services and verbal consent obtained.   The patient verbalized understanding of instructions, educational materials, and care plan provided today and agreed to receive a mailed copy of patient instructions, educational materials, and care plan.  Telephone follow up appointment with pharmacy team member scheduled for: 4 months  , Sherlie Ban  Erroll Luna

## 2021-07-29 NOTE — Chronic Care Management (AMB) (Signed)
Chronic Care Management Pharmacy Assistant   Name: [name]  MRN: [id number] DOB: 02/04/1946  [name] is an 75 y.o. year old female who presents for his initial CCM visit with the clinical pharmacist.  Reason for Encounter: Chart Review For Initial Appointment With Clinical Pharmacist   Conditions to be addressed/monitored: HTN, CAD, Heart Failure, WPW, GERD, Hyperthyroidism, HLD, Anemia, Hypokalemia, Seizures, Hyperglycemia  Primary concerns for visit include: HTN, HLD, Hyperglycemia  Recent office visits:  07/10/2021 OV Alyssa Allwardt, PA-C; EKG performed in office today shows no acute changes, very comparable to her previous EKG.  Looking back on her history it looks like these complaints are not uncommon for her.  She is going to contact her cardiologist for follow-up.   04/03/2021 OV (PCP) Shelva Majestic, MD; Suspected mild poor control-patient has not had a chance to reduce from 2 pills to 1 pill per day of methimazole 10 mg (in the past has been as high as 30 mg)- she agrees to make this adjustment today.   Recent consult visits:  07/17/2021 OV (orthopedics) Valeria Batman, MD; pain in left wrist, I think it is worth obtaining an MRI - no medication changes indicated.  03/10/2021 )V (endocrinology) Romero Belling, MD; Hyperthyroidism: overcontrolled.  reduce the methimazole to 10 mg per day.  Hospital visits:  03/30/2021 ED Visit for unspecified chest pain and weakness,  Cardiac workup initiated. No medication changes indicated.  Medications: Outpatient Encounter Medications as of 07/29/2021  Medication Sig   Aloe Vera 25 MG CAPS Take by mouth.   amLODipine (NORVASC) 10 MG tablet TAKE 1 TABLET(10 MG) BY MOUTH DAILY   ASPIRIN LOW DOSE PO Take 81 mg by mouth daily.   atorvastatin (LIPITOR) 40 MG tablet TAKE 1 TABLET(40 MG) BY MOUTH DAILY   Cholecalciferol (VITAMIN D3) 25 MCG (1000 UT) CAPS Take by mouth.   Cobalamin Combinations (NEURIVA PLUS) CAPS Take by  mouth.   Cyanocobalamin (VITAMIN B-12) 3000 MCG SUBL Place 3,000 mg under the tongue daily.   esomeprazole (NEXIUM) 20 MG capsule Take 1 capsule (20 mg total) by mouth daily.   ezetimibe (ZETIA) 10 MG tablet TAKE 1 TABLET(10 MG) BY MOUTH DAILY   furosemide (LASIX) 20 MG tablet Take 1 tablet (20 mg total) by mouth daily.   isosorbide mononitrate (IMDUR) 120 MG 24 hr tablet TAKE 1 TABLET(120 MG) BY MOUTH DAILY   methimazole (TAPAZOLE) 10 MG tablet Take 1 tablet (10 mg total) by mouth daily.   metoprolol succinate (TOPROL-XL) 25 MG 24 hr tablet TAKE 1 TABLET(25 MG) BY MOUTH DAILY   nitroGLYCERIN (NITROSTAT) 0.4 MG SL tablet PLACE 1 TABLET UNDER THE TONGUE EVERY 5 MINUTES FOR CHEST PAIN FOR UPTO 3 DOSES   oxybutynin (DITROPAN-XL) 10 MG 24 hr tablet TAKE 1 TABLET BY MOUTH EVERY DAY   potassium chloride (MICRO-K) 10 MEQ CR capsule TAKE 1 CAPSULE BY MOUTH TWICE DAILY   Prenatal Vit-Fe Fumarate-FA (PRENATAL MULTIVITAMIN) TABS tablet Take 1 tablet by mouth daily at 12 noon.   traMADol (ULTRAM) 50 MG tablet TAKE 1 TABLET BY MOUTH EVERY 8 HOURS AS NEEDED FOR MODERATE OR SEVERE PAIN   venlafaxine XR (EFFEXOR-XR) 150 MG 24 hr capsule TAKE 1 CAPSULE BY MOUTH AT BEDTIME   vitamin C (VITAMIN C) 500 MG tablet Take 1 tablet (500 mg total) by mouth 2 (two) times daily.   Vitamin E 180 MG CAPS Take 2 capsules by mouth daily.   zinc sulfate 220 (50 Zn) MG capsule Take 1  capsule (220 mg total) by mouth daily.   No facility-administered encounter medications on file as of 07/29/2021.   Current Medications: Oxybutynin 10 mg last filled 05/01/2021 90 DS Amlodipine 10 mg last filled 05/02/2021 90 DS Potassium Chloride 10 meq last filled 07/15/2021 30 DS Tramadol 50 mg last filled 07/06/2021 30 DS Venlafaxine 150 mg last filled 07/17/2021 30 DS Esomeprazole 20 mg last filled 06/15/2021 90 DS Metoprolol Succinate 25 mg last filled 05/01/2021 90 DS Ezetimibe 10 mg last filled 06/15/2021 90 DS Atorvastatin 40 mg last  filled 06/20/2021 90 DS Methimazole 10 mg last filled 06/19/2021 90 DS Nitroglycerin 0.4 mg last filled 04/01/2021 5 DS Furosemide 20 mg last filled 05/01/2021 90 DS Isosorbide Monoitrate 120 mg last filled 04/22/2021 90 DS Vitamin D3 unknown amount daily Neuriva Plus - pt currently out, can't afford it per patient Aloe Vera 25 mg - pt currently out Vitamin C unknown amount daily Vitamin B 12 500 mg once a day Aspirin 325 mg every other day  Patient Questions: Any changes in your medications or health? Patient states she hasn't had any recent changes in her medications. She states she recently went to [employer], PA-C as she has been feeling tired, with a lack of energy, not feeling very well. She states she took some antibiotics for UTI. She's not sure if she is feeling better.  Any side effects from any medications?  Patient denies any side effects from any of her medications.  Do you have any symptoms or problems not managed by your medications? She does not have any symptoms or problems that are not currently managed by her medications.  Any concerns about your health right now? She does not have any concerns about her health right now.  Has your provider asked that you check blood pressure, blood sugar, or follow special diet at home? Patient stated "No but I check it when I go to my friends house." She does not have the equipment to check her blood pressure or blood sugar at home. She states she does not follow any diet.  Do you get any type of exercise on a regular basis? Patient stated "I walk more than anything else."  Can you think of a goal you would like to reach for your health? Patient states she would like to have more energy and less medical problems.  Do you have any problems getting your medications? She states she does not have any problems getting any of her medications.  Is there anything that you would like to discuss during the appointment?  Patient  stated "I want to see what my sugar is and talk about my lack of energy."  Please bring medications and supplements to appointment.   Care Gaps: Medicare Annual Wellness: completed 02/06/2021 Hemoglobin A1C: 6.5% on 07/10/2021 Colonoscopy: Overdue - never done Dexa Scan: Ordered on 04/03/2021 Mammogram: Next due on 02/21/2022  Future Appointments  Date Time Provider Department Center  07/29/2021  9:30 AM [facility] CCM PHARMACIST [facility]  02/12/2022  2:30 PM [facility] HEALTH COACH [facility]    Star Rating Drugs: Atorvastatin 40 mg last filled 06/20/2021 90 DS  April D Calhoun, [facility] Clinical Pharmacist Assistant [phone]

## 2021-07-30 ENCOUNTER — Other Ambulatory Visit: Payer: Self-pay

## 2021-07-30 MED ORDER — NITROGLYCERIN 0.4 MG SL SUBL: SUBLINGUAL_TABLET | SUBLINGUAL | 3 refills | Status: DC

## 2021-08-01 ENCOUNTER — Telehealth: Payer: Self-pay | Admitting: Pharmacist

## 2021-08-01 NOTE — Progress Notes (Signed)
    Chronic Care Management Pharmacy Assistant   Name: Dezeray Puccio  MRN: [id number] DOB: 06/19/1946  Reason for Encounter: CCM Care Plan  Medications: Outpatient Encounter Medications as of 08/01/2021  Medication Sig   Aloe Vera 25 MG CAPS Take by mouth.   amLODipine (NORVASC) 10 MG tablet TAKE 1 TABLET(10 MG) BY MOUTH DAILY   ASPIRIN LOW DOSE PO Take 81 mg by mouth daily.   atorvastatin (LIPITOR) 40 MG tablet TAKE 1 TABLET(40 MG) BY MOUTH DAILY   Cholecalciferol (VITAMIN D3) 25 MCG (1000 UT) CAPS Take by mouth.   Cobalamin Combinations (NEURIVA PLUS) CAPS Take by mouth.   Cyanocobalamin (VITAMIN B-12) 3000 MCG SUBL Place 3,000 mg under the tongue daily.   esomeprazole (NEXIUM) 20 MG capsule Take 1 capsule (20 mg total) by mouth daily.   ezetimibe (ZETIA) 10 MG tablet TAKE 1 TABLET(10 MG) BY MOUTH DAILY   furosemide (LASIX) 20 MG tablet Take 1 tablet (20 mg total) by mouth daily.   isosorbide mononitrate (IMDUR) 120 MG 24 hr tablet TAKE 1 TABLET(120 MG) BY MOUTH DAILY   methimazole (TAPAZOLE) 10 MG tablet Take 1 tablet (10 mg total) by mouth daily.   metoprolol succinate (TOPROL-XL) 25 MG 24 hr tablet TAKE 1 TABLET(25 MG) BY MOUTH DAILY   nitroGLYCERIN (NITROSTAT) 0.4 MG SL tablet PLACE 1 TABLET UNDER THE TONGUE EVERY 5 MINUTES FOR CHEST PAIN FOR UPTO 3 DOSES   oxybutynin (DITROPAN-XL) 10 MG 24 hr tablet TAKE 1 TABLET BY MOUTH EVERY DAY   potassium chloride (MICRO-K) 10 MEQ CR capsule TAKE 1 CAPSULE BY MOUTH TWICE DAILY   Prenatal Vit-Fe Fumarate-FA (PRENATAL MULTIVITAMIN) TABS tablet Take 1 tablet by mouth daily at 12 noon.   traMADol (ULTRAM) 50 MG tablet TAKE 1 TABLET BY MOUTH EVERY 8 HOURS AS NEEDED FOR MODERATE OR SEVERE PAIN   venlafaxine XR (EFFEXOR-XR) 150 MG 24 hr capsule TAKE 1 CAPSULE BY MOUTH AT BEDTIME   vitamin C (VITAMIN C) 500 MG tablet Take 1 tablet (500 mg total) by mouth 2 (two) times daily.   Vitamin E 180 MG CAPS Take 2 capsules by mouth daily.   zinc sulfate 220  (50 Zn) MG capsule Take 1 capsule (220 mg total) by mouth daily.   No facility-administered encounter medications on file as of 08/01/2021.    Reviewed the patients initial visit reinsured it was completed per the pharmacist Erskine Emery request. Printed the CCM care plan. Mailed the patient CCM care plan to their most recent address on file.  Follow-Up:Pharmacist Review  Hulen Luster, RMA Clinical Pharmacist Assistant [phone]

## 2021-08-11 ENCOUNTER — Other Ambulatory Visit: Payer: Medicare Other

## 2021-08-11 ENCOUNTER — Inpatient Hospital Stay: Admission: RE | Admit: 2021-08-11 | Payer: Medicare Other | Source: Ambulatory Visit

## 2021-08-14 ENCOUNTER — Ambulatory Visit: Payer: Medicare Other | Admitting: Orthopaedic Surgery

## 2021-08-25 DIAGNOSIS — E7800 Pure hypercholesterolemia, unspecified: Secondary | ICD-10-CM | POA: Diagnosis not present

## 2021-08-25 DIAGNOSIS — I5020 Unspecified systolic (congestive) heart failure: Secondary | ICD-10-CM

## 2021-08-25 DIAGNOSIS — I10 Essential (primary) hypertension: Secondary | ICD-10-CM

## 2021-08-25 IMAGING — DX DG LUMBAR SPINE COMPLETE 4+V
5 series · 5 of 5 positions shown · non-contrast
Comparison: January 20, 2018

CLINICAL DATA: Low back pain the past 2 months without sciatica.
Possible lumbar compression fracture.

EXAM:
LUMBAR SPINE - COMPLETE 4+ VIEW

[lumbar spine ap]
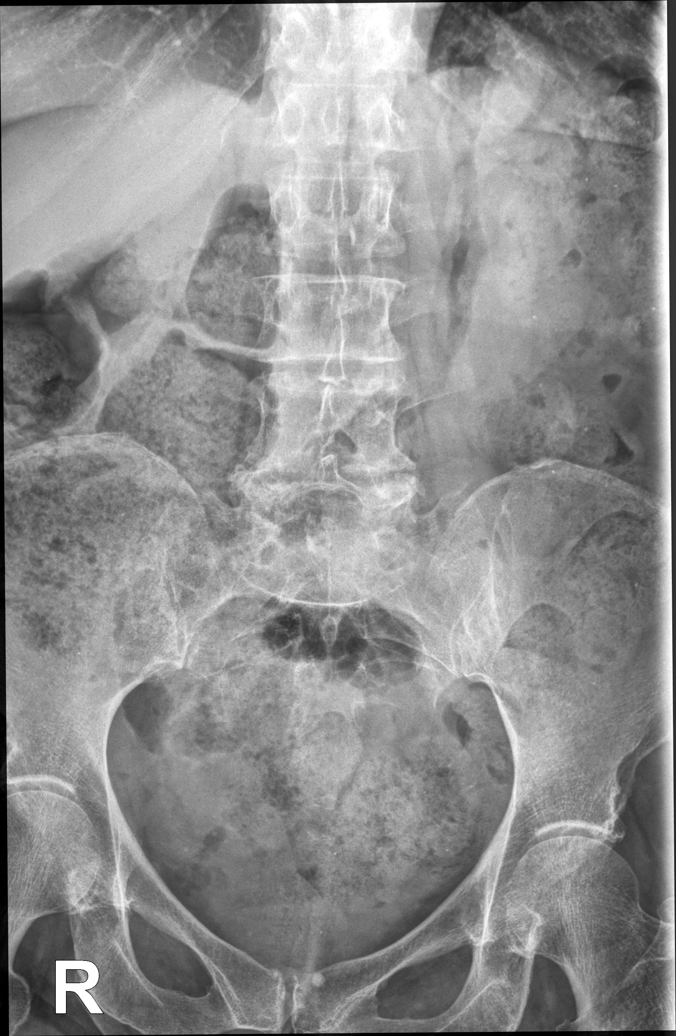

[lumbar spine oblique (1 of 2)]
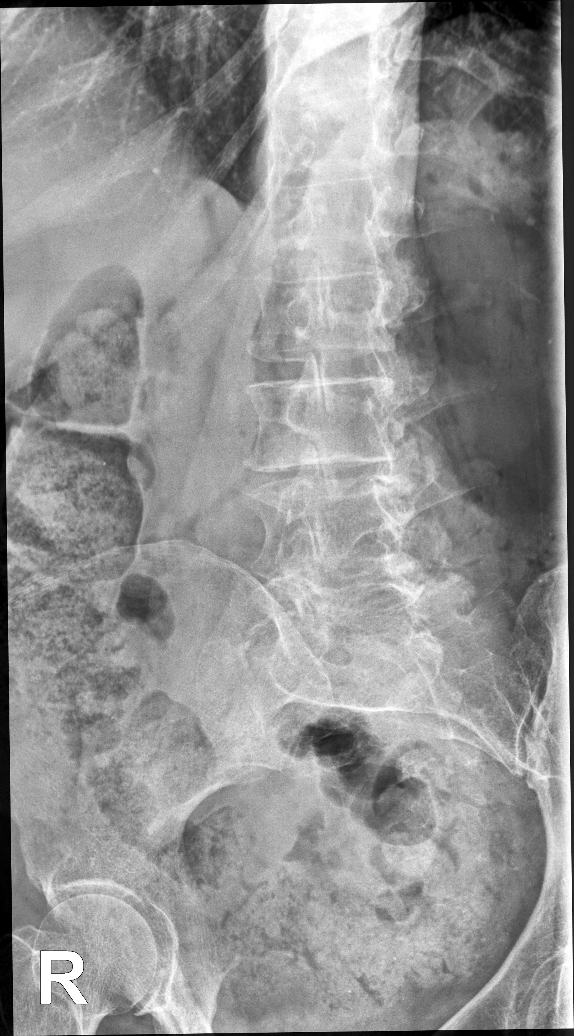

[lumbar spine oblique (2 of 2)]
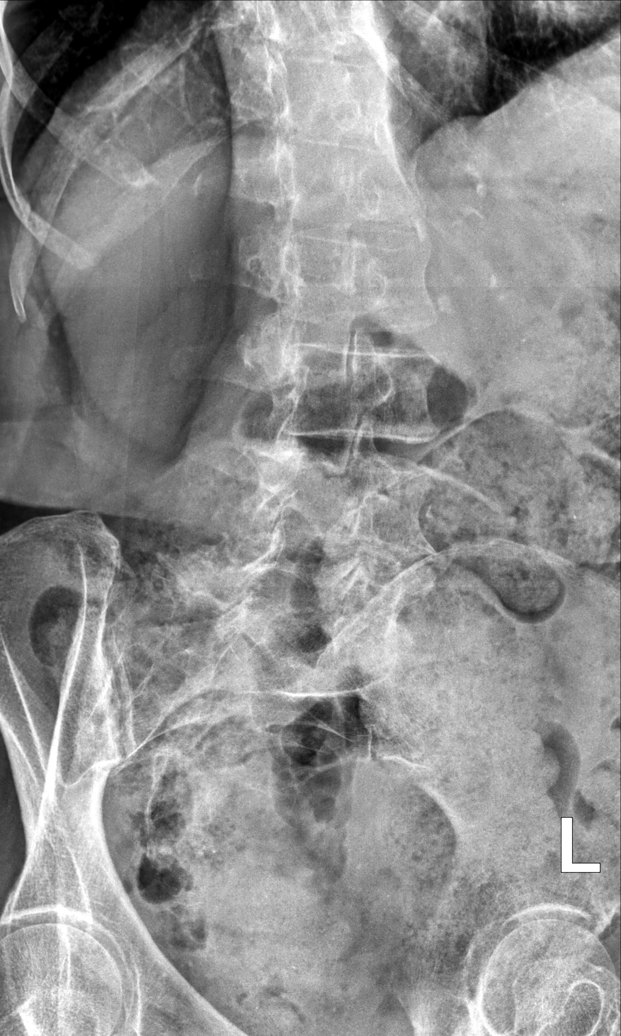

[lumbar spine lat (1 of 2)]
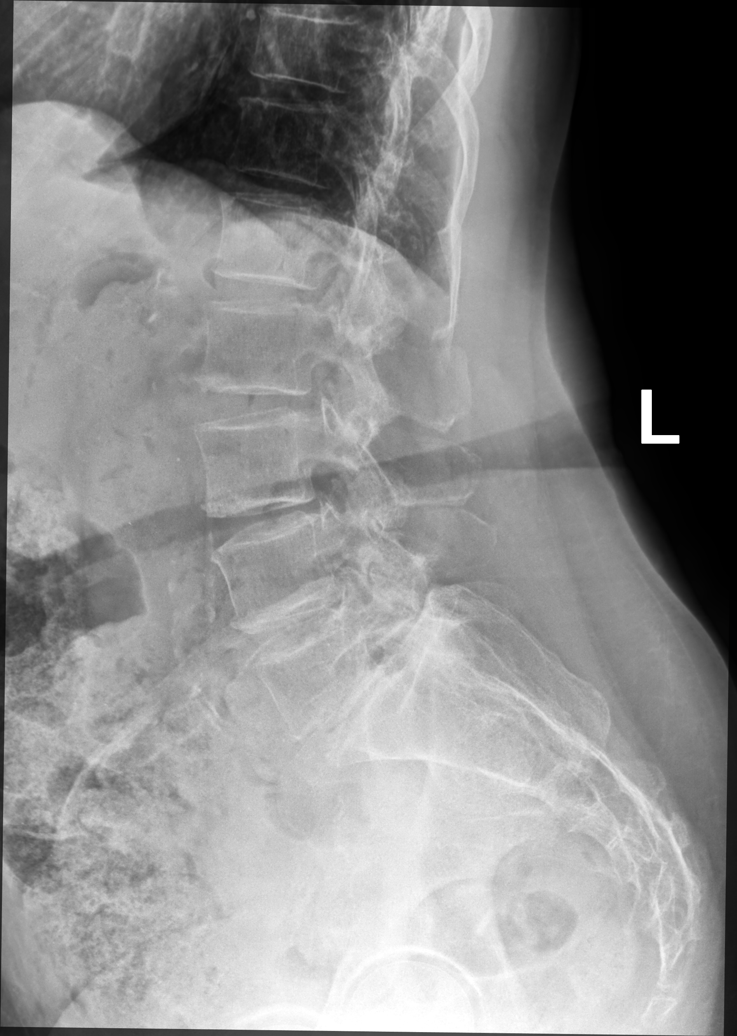

[lumbar spine lat (2 of 2)]
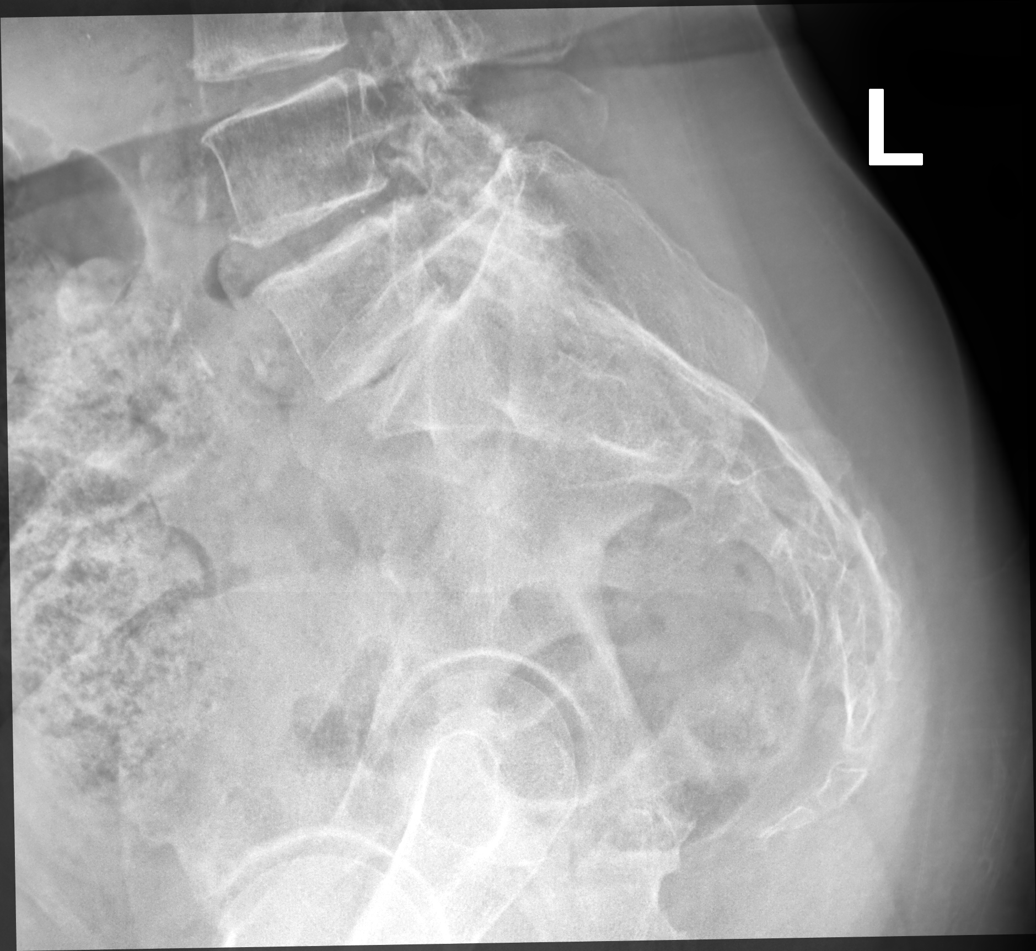

[5 of 5 positions shown; findings below may reference images not displayed]

FINDINGS: No lumbar compression fracture. Five non-rib-bearing lumbar
vertebral bodies with sacralization of L5. Similar degenerative
changes compared to December 2017. Moderate disc space narrowing and
advanced facet arthropathy at L4-5. Mild disc space narrowing at
L3-4 with moderate facet arthropathy. Osseous narrowing of the
neural foramina at these levels which may be clinically significant.
Minimal disc space narrowing at L2-3. Tiny degenerative endplate
changes from T12 to L2. Diffuse bone demineralization. No
spondylolysis or spondylolisthesis. Aorto bi-iliac calcified
atherosclerosis. Moderate stool burden without bowel obstruction.
IMPRESSION: No acute fracture or malalignment of the lumbar spine.

Degenerative changes of the lumbar spine, most pronounced at L4-5
and L5-S1, similar to December 2017.

Aorto bi-iliac calcified atherosclerosis.

## 2021-09-22 ENCOUNTER — Other Ambulatory Visit: Payer: Self-pay | Admitting: Family Medicine

## 2021-09-25 DIAGNOSIS — G44009 Cluster headache syndrome, unspecified, not intractable: Secondary | ICD-10-CM | POA: Diagnosis not present

## 2021-09-25 DIAGNOSIS — I252 Old myocardial infarction: Secondary | ICD-10-CM | POA: Diagnosis not present

## 2021-09-25 DIAGNOSIS — I10 Essential (primary) hypertension: Secondary | ICD-10-CM | POA: Diagnosis not present

## 2021-09-25 DIAGNOSIS — G43909 Migraine, unspecified, not intractable, without status migrainosus: Secondary | ICD-10-CM | POA: Diagnosis not present

## 2021-09-25 DIAGNOSIS — E0590 Thyrotoxicosis, unspecified without thyrotoxic crisis or storm: Secondary | ICD-10-CM | POA: Diagnosis not present

## 2021-09-30 ENCOUNTER — Telehealth: Payer: Self-pay | Admitting: Pharmacist

## 2021-09-30 NOTE — Chronic Care Management (AMB) (Signed)
Chronic Care Management Pharmacy Assistant   Name: [name]  MRN: [id number] DOB: [birth year]   Reason for Encounter: Hypertension Adherence Call    Recent office visits:  None  Recent consult visits:  None  Hospital visits:  None in previous 6 months  Medications: Outpatient Encounter Medications as of 09/30/2021  Medication Sig   Aloe Vera 25 MG CAPS Take by mouth.   amLODipine (NORVASC) 10 MG tablet TAKE 1 TABLET(10 MG) BY MOUTH DAILY   ASPIRIN LOW DOSE PO Take 81 mg by mouth daily.   atorvastatin (LIPITOR) 40 MG tablet TAKE 1 TABLET(40 MG) BY MOUTH DAILY   Cholecalciferol (VITAMIN D3) 25 MCG (1000 UT) CAPS Take by mouth.   Cobalamin Combinations (NEURIVA PLUS) CAPS Take by mouth.   Cyanocobalamin (VITAMIN B-12) 3000 MCG SUBL Place 3,000 mg under the tongue daily.   esomeprazole (NEXIUM) 20 MG capsule Take 1 capsule (20 mg total) by mouth daily.   ezetimibe (ZETIA) 10 MG tablet TAKE 1 TABLET(10 MG) BY MOUTH DAILY   furosemide (LASIX) 20 MG tablet Take 1 tablet (20 mg total) by mouth daily.   isosorbide mononitrate (IMDUR) 120 MG 24 hr tablet TAKE 1 TABLET(120 MG) BY MOUTH DAILY   methimazole (TAPAZOLE) 10 MG tablet Take 1 tablet (10 mg total) by mouth daily.   metoprolol succinate (TOPROL-XL) 25 MG 24 hr tablet TAKE 1 TABLET(25 MG) BY MOUTH DAILY   nitroGLYCERIN (NITROSTAT) 0.4 MG SL tablet PLACE 1 TABLET UNDER THE TONGUE EVERY 5 MINUTES FOR CHEST PAIN FOR UPTO 3 DOSES   oxybutynin (DITROPAN-XL) 10 MG 24 hr tablet TAKE 1 TABLET BY MOUTH EVERY DAY   potassium chloride (MICRO-K) 10 MEQ CR capsule TAKE 1 CAPSULE BY MOUTH TWICE DAILY   Prenatal Vit-Fe Fumarate-FA (PRENATAL MULTIVITAMIN) TABS tablet Take 1 tablet by mouth daily at 12 noon.   traMADol (ULTRAM) 50 MG tablet TAKE 1 TABLET BY MOUTH EVERY 8 HOURS AS NEEDED FOR MODERATE OR SEVERE PAIN   venlafaxine XR (EFFEXOR-XR) 150 MG 24 hr capsule TAKE 1 CAPSULE BY MOUTH AT BEDTIME   vitamin C (VITAMIN C) 500 MG tablet Take 1  tablet (500 mg total) by mouth 2 (two) times daily.   Vitamin E 180 MG CAPS Take 2 capsules by mouth daily.   zinc sulfate 220 (50 Zn) MG capsule Take 1 capsule (220 mg total) by mouth daily.   No facility-administered encounter medications on file as of 09/30/2021.   Reviewed chart prior to disease state call. Spoke with patient regarding BP  Recent Office Vitals: BP Readings from Last 3 Encounters:  07/10/21 110/68  04/03/21 118/64  03/30/21 125/71   Pulse Readings from Last 3 Encounters:  07/10/21 63  04/03/21 67  03/30/21 63    Wt Readings from Last 3 Encounters:  07/10/21 155 lb 3.2 oz (70.4 kg)  04/03/21 155 lb 12.8 oz (70.7 kg)  03/10/21 155 lb 12.8 oz (70.7 kg)     Kidney Function Lab Results  Component Value Date/Time   CREATININE 1.12 07/10/2021 02:28 PM   CREATININE 1.01 04/03/2021 02:24 PM   CREATININE 1.08 (H) 11/29/2020 02:31 PM   CREATININE 0.79 06/04/2017 04:59 PM   GFR 48.38 (L) 07/10/2021 02:28 PM   GFRNONAA 51 (L) 03/30/2021 05:59 AM   GFRAA 56 (L) 02/04/2020 02:57 AM    BMP Latest Ref Rng & Units 07/10/2021 04/03/2021 03/30/2021  Glucose 70 - 99 mg/dL 75 71 662(H)  BUN 6 - 23 mg/dL 17 13 17   Creatinine  0.40 - 1.20 mg/dL 7.86 7.54 4.92(E)  BUN/Creat Ratio 6 - 22 (calc) - - -  Sodium 135 - 145 mEq/L 138 140 137  Potassium 3.5 - 5.1 mEq/L 4.0 3.7 3.3(L)  Chloride 96 - 112 mEq/L 104 105 104  CO2 19 - 32 mEq/L 28 29 25   Calcium 8.4 - 10.5 mg/dL 9.2 9.2 9.3    Current antihypertensive regimen:  Metoprolol Succinate 25 mg daily Furosemide 20 mg daily Isosorbide Mononitrate 120 mg daily  How often are you checking your Blood Pressure? several times per month  Current home BP readings: ~160/90 - patient states she has had troubles with her blood pressure and blood sugar. She also states she has had some pains in her chest. She is calling Dr. office to schedule an appointment today (10/02/2021)  What recent interventions/DTPs have been made by any  provider to improve Blood Pressure control since last CPP Visit: No recent interventions or DTPs.  Any recent hospitalizations or ED visits since last visit with CPP? No  What diet changes have been made to improve Blood Pressure Control?  Patient states she has been trying to avoid salt.  What exercise is being done to improve your Blood Pressure Control?  Patient states she likes to walk when she can.  Adherence Review: Is the patient currently on ACE/ARB medication? No Does the patient have >5 day gap between last estimated fill dates? No   Care Gaps: Medicare Annual Wellness: Completed 02/06/2021 Hemoglobin A1C: 6.5% on 07/10/2021 Colonoscopy: Overdue - never done Dexa Scan: Ordered on 04/03/2021 Mammogram: last completed 02/22/2020  Future Appointments  Date Time Provider Department Center  01/26/2022  3:45 PM [facility] CCM PHARMACIST [facility]  02/12/2022  2:30 PM [facility] HEALTH COACH [facility]    Star Rating Drugs: Atorvastatin 40 mg last filled 06/20/2021 90 DS   April D Calhoun, [facility] Clinical Pharmacist Assistant [phone]

## 2021-10-06 ENCOUNTER — Ambulatory Visit: Payer: Medicare Other | Admitting: Family

## 2021-10-27 ENCOUNTER — Other Ambulatory Visit: Payer: Self-pay | Admitting: Family Medicine

## 2021-10-30 ENCOUNTER — Other Ambulatory Visit: Payer: Self-pay | Admitting: Internal Medicine

## 2021-11-06 ENCOUNTER — Other Ambulatory Visit: Payer: Self-pay | Admitting: Cardiology

## 2021-11-13 ENCOUNTER — Telehealth: Payer: Self-pay | Admitting: Pharmacist

## 2021-11-13 NOTE — Chronic Care Management (AMB) (Signed)
Chronic Care Management Pharmacy Assistant   Name: [name]  MRN: [id number] DOB: May 11, 1946   Reason for Encounter: Hypertension Adherence Call    Recent office visits:  None  Recent consult visits:  None  Hospital visits:  None in previous 6 months  Medications: Outpatient Encounter Medications as of 11/13/2021  Medication Sig   Aloe Vera 25 MG CAPS Take by mouth.   amLODipine (NORVASC) 10 MG tablet TAKE 1 TABLET(10 MG) BY MOUTH DAILY   ASPIRIN LOW DOSE PO Take 81 mg by mouth daily.   atorvastatin (LIPITOR) 40 MG tablet TAKE 1 TABLET(40 MG) BY MOUTH DAILY   Cholecalciferol (VITAMIN D3) 25 MCG (1000 UT) CAPS Take by mouth.   Cobalamin Combinations (NEURIVA PLUS) CAPS Take by mouth.   Cyanocobalamin (VITAMIN B-12) 3000 MCG SUBL Place 3,000 mg under the tongue daily.   esomeprazole (NEXIUM) 20 MG capsule Take 1 capsule (20 mg total) by mouth daily.   ezetimibe (ZETIA) 10 MG tablet TAKE 1 TABLET(10 MG) BY MOUTH DAILY   furosemide (LASIX) 20 MG tablet Take 1 tablet (20 mg total) by mouth daily.   isosorbide mononitrate (IMDUR) 120 MG 24 hr tablet TAKE 1 TABLET(120 MG) BY MOUTH DAILY   methimazole (TAPAZOLE) 10 MG tablet Take 1 tablet (10 mg total) by mouth daily.   metoprolol succinate (TOPROL-XL) 25 MG 24 hr tablet TAKE 1 TABLET(25 MG) BY MOUTH DAILY   nitroGLYCERIN (NITROSTAT) 0.4 MG SL tablet PLACE 1 TABLET UNDER THE TONGUE EVERY 5 MINUTES FOR CHEST PAIN FOR UPTO 3 DOSES   oxybutynin (DITROPAN-XL) 10 MG 24 hr tablet TAKE 1 TABLET BY MOUTH EVERY DAY   potassium chloride (MICRO-K) 10 MEQ CR capsule TAKE 1 CAPSULE BY MOUTH TWICE DAILY   Prenatal Vit-Fe Fumarate-FA (PRENATAL MULTIVITAMIN) TABS tablet Take 1 tablet by mouth daily at 12 noon.   traMADol (ULTRAM) 50 MG tablet TAKE 1 TABLET BY MOUTH EVERY 8 HOURS AS NEEDED FOR MODERATE OR SEVERE PAIN   venlafaxine XR (EFFEXOR-XR) 150 MG 24 hr capsule TAKE 1 CAPSULE BY MOUTH AT BEDTIME   vitamin C (VITAMIN C) 500 MG tablet Take 1  tablet (500 mg total) by mouth 2 (two) times daily.   Vitamin E 180 MG CAPS Take 2 capsules by mouth daily.   zinc sulfate 220 (50 Zn) MG capsule Take 1 capsule (220 mg total) by mouth daily.   No facility-administered encounter medications on file as of 11/13/2021.   Reviewed chart prior to disease state call. Spoke with patient regarding BP  Recent Office Vitals: BP Readings from Last 3 Encounters:  07/10/21 110/68  04/03/21 118/64  03/30/21 125/71   Pulse Readings from Last 3 Encounters:  07/10/21 63  04/03/21 67  03/30/21 63    Wt Readings from Last 3 Encounters:  07/10/21 155 lb 3.2 oz (70.4 kg)  04/03/21 155 lb 12.8 oz (70.7 kg)  03/10/21 155 lb 12.8 oz (70.7 kg)     Kidney Function Lab Results  Component Value Date/Time   CREATININE 1.12 07/10/2021 02:28 PM   CREATININE 1.01 04/03/2021 02:24 PM   CREATININE 1.08 (H) 11/29/2020 02:31 PM   CREATININE 0.79 06/04/2017 04:59 PM   GFR 48.38 (L) 07/10/2021 02:28 PM   GFRNONAA 51 (L) 03/30/2021 05:59 AM   GFRAA 56 (L) 02/04/2020 02:57 AM    BMP Latest Ref Rng & Units 07/10/2021 04/03/2021 03/30/2021  Glucose 70 - 99 mg/dL 75 71 759(F)  BUN 6 - 23 mg/dL 17 13 17   Creatinine  0.40 - 1.20 mg/dL 9.50 9.32 6.71(I)  BUN/Creat Ratio 6 - 22 (calc) - - -  Sodium 135 - 145 mEq/L 138 140 137  Potassium 3.5 - 5.1 mEq/L 4.0 3.7 3.3(L)  Chloride 96 - 112 mEq/L 104 105 104  CO2 19 - 32 mEq/L 28 29 25   Calcium 8.4 - 10.5 mg/dL 9.2 9.2 9.3    Current antihypertensive regimen:  Isosorbide 120 mg daily Metoprolol Succinate 25 mg daily Amlodipine 10 mg daily Furosemide 20 mg daily  How often are you checking your Blood Pressure? infrequently  Current home BP readings: Patient states her she don't currently have a way to monitor her blood pressure. She states she used to check it a lot at a friends house but since the weather has been cold she doesn't go as much.  What recent interventions/DTPs have been made by any provider to improve  Blood Pressure control since last CPP Visit: No recent interventions or DTPs.  Any recent hospitalizations or ED visits since last visit with CPP? No  What diet changes have been made to improve Blood Pressure Control?  No recent changes made. She does avoid salt.  What exercise is being done to improve your Blood Pressure Control?  Patient states she likes to walk.  Adherence Review: Is the patient currently on ACE/ARB medication? No Does the patient have >5 day gap between last estimated fill dates? No  Patient states her head feels like it is swimming. She would like to schedule an appointment with Dr. . She would like a call back from Dr. Durene Cal nurse to schedule an appointment to check her blood pressure.  Care Gaps: Medicare Annual Wellness: Completed 02/06/2021 Hemoglobin A1C: 6.6% on 07/10/2021 Colonoscopy: Overdue- never done Dexa Scan: Overdue - never done Mammogram: Overdue since 02/14/2022  Future Appointments  Date Time Provider Department Center  01/26/2022  3:45 PM [facility] CCM PHARMACIST [facility]  02/12/2022  2:30 PM [facility] HEALTH COACH [facility]    Star Rating Drugs: Atorvastatin 40 mg last filled 10/12/2021 90 DS  April D Calhoun, [facility] Clinical Pharmacist Assistant [phone]

## 2021-11-28 DIAGNOSIS — Z1231 Encounter for screening mammogram for malignant neoplasm of breast: Secondary | ICD-10-CM | POA: Diagnosis not present

## 2021-11-29 ENCOUNTER — Other Ambulatory Visit: Payer: Self-pay | Admitting: Family Medicine

## 2021-12-09 ENCOUNTER — Other Ambulatory Visit: Payer: Self-pay | Admitting: Family Medicine

## 2021-12-09 ENCOUNTER — Other Ambulatory Visit: Payer: Self-pay | Admitting: Interventional Cardiology

## 2021-12-10 NOTE — Telephone Encounter (Signed)
The original prescription was discontinued on 04/03/2021 by Shelva Majestic, MD. Renewing this prescription may not be appropriate.

## 2021-12-14 ENCOUNTER — Other Ambulatory Visit: Payer: Self-pay

## 2021-12-14 ENCOUNTER — Emergency Department (HOSPITAL_COMMUNITY)
Admission: EM | Admit: 2021-12-14 | Discharge: 2021-12-14 | Disposition: A | Discharge status: Home / Self Care | Payer: Medicare Other | Attending: Emergency Medicine | Admitting: Emergency Medicine

## 2021-12-14 ENCOUNTER — Encounter (HOSPITAL_COMMUNITY): Payer: Self-pay | Admitting: Emergency Medicine

## 2021-12-14 ENCOUNTER — Emergency Department (HOSPITAL_COMMUNITY): Payer: Medicare Other

## 2021-12-14 DIAGNOSIS — M6281 Muscle weakness (generalized): Secondary | ICD-10-CM | POA: Insufficient documentation

## 2021-12-14 DIAGNOSIS — R1032 Left lower quadrant pain: Secondary | ICD-10-CM | POA: Diagnosis present

## 2021-12-14 DIAGNOSIS — K838 Other specified diseases of biliary tract: Secondary | ICD-10-CM | POA: Insufficient documentation

## 2021-12-14 DIAGNOSIS — E039 Hypothyroidism, unspecified: Secondary | ICD-10-CM | POA: Diagnosis not present

## 2021-12-14 DIAGNOSIS — R9431 Abnormal electrocardiogram [ECG] [EKG]: Secondary | ICD-10-CM | POA: Diagnosis not present

## 2021-12-14 DIAGNOSIS — Z20822 Contact with and (suspected) exposure to covid-19: Secondary | ICD-10-CM | POA: Diagnosis not present

## 2021-12-14 DIAGNOSIS — R519 Headache, unspecified: Secondary | ICD-10-CM | POA: Diagnosis not present

## 2021-12-14 DIAGNOSIS — I700 Atherosclerosis of aorta: Secondary | ICD-10-CM | POA: Diagnosis not present

## 2021-12-14 DIAGNOSIS — K5900 Constipation, unspecified: Secondary | ICD-10-CM | POA: Insufficient documentation

## 2021-12-14 DIAGNOSIS — N3000 Acute cystitis without hematuria: Secondary | ICD-10-CM | POA: Insufficient documentation

## 2021-12-14 DIAGNOSIS — R109 Unspecified abdominal pain: Secondary | ICD-10-CM | POA: Diagnosis not present

## 2021-12-14 DIAGNOSIS — Z7982 Long term (current) use of aspirin: Secondary | ICD-10-CM | POA: Diagnosis not present

## 2021-12-14 DIAGNOSIS — R1030 Lower abdominal pain, unspecified: Secondary | ICD-10-CM | POA: Diagnosis not present

## 2021-12-14 LAB — URINALYSIS, MICROSCOPIC (REFLEX)

## 2021-12-14 LAB — CBC
HCT: 35.2 % — ABNORMAL LOW (ref 36.0–46.0)
Hemoglobin: 11.7 g/dL — ABNORMAL LOW (ref 12.0–15.0)
MCH: 32.1 pg (ref 26.0–34.0)
MCHC: 33.2 g/dL (ref 30.0–36.0)
MCV: 96.7 fL (ref 80.0–100.0)
Platelets: 220 10*3/uL (ref 150–400)
RBC: 3.64 MIL/uL — ABNORMAL LOW (ref 3.87–5.11)
RDW: 13.4 % (ref 11.5–15.5)
WBC: 6.5 10*3/uL (ref 4.0–10.5)
nRBC: 0 % (ref 0.0–0.2)

## 2021-12-14 LAB — COMPREHENSIVE METABOLIC PANEL
ALT: 39 U/L (ref 0–44)
AST: 38 U/L (ref 15–41)
Albumin: 3.4 g/dL — ABNORMAL LOW (ref 3.5–5.0)
Alkaline Phosphatase: 59 U/L (ref 38–126)
Anion gap: 10 (ref 5–15)
BUN: 15 mg/dL (ref 8–23)
CO2: 22 mmol/L (ref 22–32)
Calcium: 9.2 mg/dL (ref 8.9–10.3)
Chloride: 106 mmol/L (ref 98–111)
Creatinine, Ser: 1.01 mg/dL — ABNORMAL HIGH (ref 0.44–1.00)
GFR, Estimated: 58 mL/min — ABNORMAL LOW (ref >60)
Glucose, Bld: 110 mg/dL — ABNORMAL HIGH (ref 70–99)
Potassium: 3.4 mmol/L — ABNORMAL LOW (ref 3.5–5.1)
Sodium: 138 mmol/L (ref 135–145)
Total Bilirubin: 0.7 mg/dL (ref 0.3–1.2)
Total Protein: 7.3 g/dL (ref 6.5–8.1)

## 2021-12-14 LAB — URINALYSIS, ROUTINE W REFLEX MICROSCOPIC
Bilirubin Urine: NEGATIVE
Glucose, UA: NEGATIVE mg/dL
Hgb urine dipstick: NEGATIVE
Ketones, ur: NEGATIVE mg/dL
Leukocytes,Ua: NEGATIVE
Nitrite: POSITIVE — AB
Protein, ur: 30 mg/dL — AB
Specific Gravity, Urine: 1.025 (ref 1.005–1.030)
pH: 6 (ref 5.0–8.0)

## 2021-12-14 LAB — TSH: TSH: 0.02 u[IU]/mL — ABNORMAL LOW (ref 0.350–4.500)

## 2021-12-14 LAB — RESP PANEL BY RT-PCR (FLU A&B, COVID) ARPGX2
Influenza A by PCR: NEGATIVE
Influenza B by PCR: NEGATIVE
SARS Coronavirus 2 by RT PCR: NEGATIVE

## 2021-12-14 LAB — LIPASE, BLOOD: Lipase: 27 U/L (ref 11–51)

## 2021-12-14 LAB — T4, FREE: Free T4: 1.34 ng/dL — ABNORMAL HIGH (ref 0.61–1.12)

## 2021-12-14 MED ORDER — IOHEXOL 300 MG/ML  SOLN
100.0000 mL | Freq: Once | INTRAMUSCULAR | Status: AC | PRN
  Administered 2021-12-14: 100 mL via INTRAVENOUS

## 2021-12-14 MED ORDER — NITROFURANTOIN MONOHYD MACRO 100 MG PO CAPS: 100.0000 mg | ORAL_CAPSULE | Freq: Two times a day (BID) | ORAL | 0 refills | Status: AC

## 2021-12-14 MED ORDER — DIPHENHYDRAMINE HCL 50 MG/ML IJ SOLN
12.5000 mg | Freq: Once | INTRAMUSCULAR | Status: AC
  Administered 2021-12-14: 12.5 mg via INTRAVENOUS
  Filled 2021-12-14: qty 1

## 2021-12-14 MED ORDER — METOCLOPRAMIDE HCL 5 MG/ML IJ SOLN
5.0000 mg | Freq: Once | INTRAMUSCULAR | Status: AC
  Administered 2021-12-14: 5 mg via INTRAVENOUS
  Filled 2021-12-14: qty 2

## 2021-12-14 MED ORDER — NITROFURANTOIN MONOHYD MACRO 100 MG PO CAPS
100.0000 mg | ORAL_CAPSULE | Freq: Once | ORAL | Status: AC
Start: 2021-12-14 — End: 2021-12-14
  Administered 2021-12-14: 100 mg via ORAL
  Filled 2021-12-14: qty 1

## 2021-12-14 MED ORDER — KETOROLAC TROMETHAMINE 15 MG/ML IJ SOLN
15.0000 mg | Freq: Once | INTRAMUSCULAR | Status: AC
  Administered 2021-12-14: 15 mg via INTRAVENOUS
  Filled 2021-12-14: qty 1

## 2021-12-14 NOTE — ED Notes (Signed)
Reviewed discharge instructions with patient. Follow-up care and medications reviewed. Patient  verbalized understanding. Patient A&Ox4, VSS, and ambulatory with steady gait upon discharge.  °

## 2021-12-14 NOTE — ED Triage Notes (Signed)
Reports generalized weakness, nausea, and decreased appetite x 3 days.  Reports LLQ tenderness when lying on L side.

## 2021-12-14 NOTE — ED Provider Notes (Signed)
[place] EMERGENCY DEPARTMENT Provider Note   CSN: [id number] Arrival date & time: 12/14/21  1429     History  Chief Complaint  Patient presents with   Abdominal Pain   Weakness    [name] is a 76 y.o. female presenting Emergency Department multiple complaints.  The patient reports that she has had diminished appetite and left lower quadrant abdominal tenderness and pain for the past 3 days.  She says she has a history of diverticulitis and feels similar to that.  She did have a regular bowel movement yesterday.  No diarrhea.  She is nauseated but no vomiting.  She also reports she has had generalized weakness, muscle aches, intermittent headache (she has suffered from chronic headaches).  She is also requested her thyroid levels be checked.  HPI     Home Medications Prior to Admission medications   Medication Sig Start Date End Date Taking? Authorizing Provider  nitrofurantoin, macrocrystal-monohydrate, (MACROBID) 100 MG capsule Take 1 capsule (100 mg total) by mouth 2 (two) times daily for 5 days. 12/14/21 12/19/21 Yes Umer Harig, Carola Rhine, MD  Aloe Vera 25 MG CAPS Take by mouth.    [provider]  amLODipine (NORVASC) 10 MG tablet TAKE 1 TABLET(10 MG) BY MOUTH DAILY 05/02/21   Marin Olp, MD  ASPIRIN LOW DOSE PO Take 81 mg by mouth daily.    [provider]  atorvastatin (LIPITOR) 40 MG tablet TAKE 1 TABLET(40 MG) BY MOUTH DAILY 03/12/21   Marin Olp, MD  Cholecalciferol (VITAMIN D3) 25 MCG (1000 UT) CAPS Take by mouth.    [provider]  Cobalamin Combinations (NEURIVA PLUS) CAPS Take by mouth.    [provider]  Cyanocobalamin (VITAMIN B-12) 3000 MCG SUBL Place 3,000 mg under the tongue daily.    [provider]  esomeprazole (NEXIUM) 20 MG capsule Take 1 capsule (20 mg total) by mouth daily. 04/03/21   Marin Olp, MD  ezetimibe (ZETIA) 10 MG tablet TAKE 1 TABLET(10 MG) BY MOUTH DAILY 12/01/21    Marin Olp, MD  furosemide (LASIX) 20 MG tablet Take 1 tablet (20 mg total) by mouth daily. 01/31/21   Allred, Jeneen Rinks, MD  isosorbide mononitrate (IMDUR) 120 MG 24 hr tablet Take 1 tablet (120 mg total) by mouth daily. Please make overdue appt with Dr. Tamala Julian before anymore refills. Thank you 2nd attempt 12/10/21   Belva Crome, MD  methimazole (TAPAZOLE) 10 MG tablet Take 1 tablet (10 mg total) by mouth daily. 03/10/21   Renato Shin, MD  metoprolol succinate (TOPROL-XL) 25 MG 24 hr tablet TAKE 1 TABLET(25 MG) BY MOUTH DAILY 10/30/21   Allred, Jeneen Rinks, MD  nitroGLYCERIN (NITROSTAT) 0.4 MG SL tablet PLACE 1 TABLET UNDER THE TONGUE EVERY 5 MINUTES FOR CHEST PAIN FOR UPTO 3 DOSES 07/30/21   Allred, Jeneen Rinks, MD  oxybutynin (DITROPAN-XL) 10 MG 24 hr tablet TAKE 1 TABLET BY MOUTH EVERY DAY 05/02/21   Marin Olp, MD  potassium chloride (MICRO-K) 10 MEQ CR capsule TAKE 1 CAPSULE BY MOUTH TWICE DAILY 05/02/21   Marin Olp, MD  Prenatal Vit-Fe Fumarate-FA (PRENATAL MULTIVITAMIN) TABS tablet Take 1 tablet by mouth daily at 12 noon.    [provider]  traMADol (ULTRAM) 50 MG tablet TAKE 1 TABLET BY MOUTH EVERY 8 HOURS AS NEEDED FOR MODERATE OR SEVERE PAIN 04/23/21   Marin Olp, MD  venlafaxine XR (EFFEXOR-XR) 150 MG 24 hr capsule TAKE 1 CAPSULE BY MOUTH AT  BEDTIME 10/28/21   Marin Olp, MD  vitamin C (VITAMIN C) 500 MG tablet Take 1 tablet (500 mg total) by mouth 2 (two) times daily. 08/08/19   Shelly Coss, MD  Vitamin E 180 MG CAPS Take 2 capsules by mouth daily.    [provider]  zinc sulfate 220 (50 Zn) MG capsule Take 1 capsule (220 mg total) by mouth daily. 08/09/19   Shelly Coss, MD      Allergies    Crestor [rosuvastatin] and Sulfa drugs cross reactors    Review of Systems   Review of Systems  Physical Exam Updated Vital Signs BP 133/81    Pulse 83    Temp 98.8 F (37.1 C) (Oral)    Resp (!) 21    SpO2 100%  Physical Exam Constitutional:       General: She is not in acute distress. HENT:     Head: Normocephalic and atraumatic.  Eyes:     Conjunctiva/sclera: Conjunctivae normal.     Pupils: Pupils are equal, round, and reactive to light.  Cardiovascular:     Rate and Rhythm: Normal rate and regular rhythm.  Pulmonary:     Effort: Pulmonary effort is normal. No respiratory distress.  Abdominal:     General: There is no distension.     Tenderness: There is abdominal tenderness in the left lower quadrant. There is no guarding or rebound. Negative signs include McBurney's sign.  Skin:    General: Skin is warm and dry.  Neurological:     General: No focal deficit present.     Mental Status: She is alert. Mental status is at baseline.  Psychiatric:        Mood and Affect: Mood normal.        Behavior: Behavior normal.    ED Results / Procedures / Treatments   Labs (all labs ordered are listed, but only abnormal results are displayed) Labs Reviewed  COMPREHENSIVE METABOLIC PANEL - Abnormal; Notable for the following components:      Result Value   Potassium 3.4 (*)    Glucose, Bld 110 (*)    Creatinine, Ser 1.01 (*)    Albumin 3.4 (*)    GFR, Estimated 58 (*)    All other components within normal limits  CBC - Abnormal; Notable for the following components:   RBC 3.64 (*)    Hemoglobin 11.7 (*)    HCT 35.2 (*)    All other components within normal limits  URINALYSIS, ROUTINE W REFLEX MICROSCOPIC - Abnormal; Notable for the following components:   Protein, ur 30 (*)    Nitrite POSITIVE (*)    All other components within normal limits  TSH - Abnormal; Notable for the following components:   TSH 0.020 (*)    All other components within normal limits  T4, FREE - Abnormal; Notable for the following components:   Free T4 1.34 (*)    All other components within normal limits  URINALYSIS, MICROSCOPIC (REFLEX) - Abnormal; Notable for the following components:   Bacteria, UA RARE (*)    All other components within normal  limits  RESP PANEL BY RT-PCR (FLU A&B, COVID) [id number]  URINE CULTURE  LIPASE, BLOOD    EKG EKG Interpretation  Date/Time:  Sunday December 14 2021 15:58:58 EST Ventricular Rate:  75 PR Interval:  131 QRS Duration: 95 QT Interval:  349 QTC Calculation: 390 R Axis:   59 Text Interpretation: Sinus rhythm  No sig change from prior tracing Confirmed  by Octaviano Glow [phone]) on 12/14/2021 4:00:44 PM  Radiology CT ABDOMEN PELVIS W CONTRAST  Result Date: 12/14/2021 CLINICAL DATA:  Left lower quadrant abdominal pain EXAM: CT ABDOMEN AND PELVIS WITH CONTRAST TECHNIQUE: Multidetector CT imaging of the abdomen and pelvis was performed using the standard protocol following bolus administration of intravenous contrast. RADIATION DOSE REDUCTION: This exam was performed according to the departmental dose-optimization program which includes automated exposure control, adjustment of the mA and/or kV according to patient size and/or use of iterative reconstruction technique. CONTRAST:  169mL OMNIPAQUE IOHEXOL 300 MG/ML  SOLN COMPARISON:  CT chest, 09/04/2019, CT abdomen pelvis, 09/30/2011 FINDINGS: Lower chest: No acute abnormality.  Coronary artery calcifications. Hepatobiliary: No solid liver abnormality is seen. No radiopaque gallstones or gallbladder wall thickening. Intra and extrahepatic biliary ductal dilatation, common bile duct measuring up to 1.1 cm in caliber. No obstructing calculus or other lesion identified to the ampulla. Pancreas: Unremarkable. No pancreatic ductal dilatation or surrounding inflammatory changes. Spleen: Normal in size without significant abnormality. Adrenals/Urinary Tract: Adrenal glands are unremarkable. Kidneys are normal, without renal calculi, solid lesion, or hydronephrosis. Bladder is unremarkable. Stomach/Bowel: Stomach is within normal limits. Appendix appears normal. No evidence of bowel wall thickening, distention, or inflammatory changes. Large burden of stool  throughout the colon. Sigmoid diverticula. Vascular/Lymphatic: Aortic atherosclerosis. No enlarged abdominal or pelvic lymph nodes. Reproductive: Status post hysterectomy. Other: No abdominal wall hernia or abnormality. No ascites. Musculoskeletal: No acute or significant osseous findings. IMPRESSION: 1. No definite CT findings of the abdomen or pelvis to explain left lower quadrant abdominal pain. Diverticulosis without evidence of acute diverticulitis. 2. Large burden of stool throughout the colon. 3. Intra and extrahepatic biliary ductal dilatation, common bile duct measuring up to 1.1 cm in caliber. No radiopaque gallstones, obstructing calculus or other lesion identified to the ampulla. This is unchanged compared to prior examination dated 09/04/2019 and is of doubtful clinical significance in the absence of evidence cholestasis. 4. Coronary artery disease. Aortic Atherosclerosis (ICD10-I70.0). Electronically Signed   By: Delanna Ahmadi M.D.   On: 12/14/2021 18:02    Procedures Procedures    Medications Ordered in ED Medications  ketorolac (TORADOL) 15 MG/ML injection 15 mg (15 mg Intravenous Given 12/14/21 1707)  metoCLOPramide (REGLAN) injection 5 mg (5 mg Intravenous Given 12/14/21 1707)  diphenhydrAMINE (BENADRYL) injection 12.5 mg (12.5 mg Intravenous Given 12/14/21 1707)  nitrofurantoin (macrocrystal-monohydrate) (MACROBID) capsule 100 mg (100 mg Oral Given 12/14/21 1813)  iohexol (OMNIPAQUE) 300 MG/ML solution 100 mL (100 mLs Intravenous Contrast Given 12/14/21 1747)    ED Course/ Medical Decision Making/ A&P Clinical Course as of 12/14/21 2314  Sun Dec 14, 2021  1700 Nitrite(!): POSITIVE [MT]  1702 Patient has had multiple positive UTIs, I reviewed her prior cultures, they are susceptible to [facility] which is ordered here.  She does have sulfa allergies and I would avoid Bactrim.   [MT]  1836 MPRESSION: 1. No definite CT findings of the abdomen or pelvis to explain left lower quadrant  abdominal pain. Diverticulosis without evidence of acute diverticulitis. 2. Large burden of stool throughout the colon. 3. Intra and extrahepatic biliary ductal dilatation, common bile duct measuring up to 1.1 cm in caliber. No radiopaque gallstones, obstructing calculus or other lesion identified to the ampulla. This is unchanged compared to prior examination dated 09/04/2019 and is of doubtful clinical significance in the absence of evidence cholestasis. 4. Coronary artery disease. [MT]  1850 Patient updated regarding diagnosis of likely UTI, will start on Macrobid.  We  discussed her constipation I recommended a laxative and a stool softener for bowel cleanout, which she verbalized understanding.  Her TSH and T4 levels indicate some very mild hypothyroidism, but I would not make any medications or ongoing thyroid medicines, and I doubt that this is causing her symptoms. [MT]    Clinical Course User Index [MT] Zaiya Annunziato, Carola Rhine, MD                           Medical Decision Making Amount and/or Complexity of Data Reviewed Labs: ordered. Decision-making details documented in ED Course. Radiology: ordered.  Risk Prescription drug management.   This patient presents to the ED with concern for primarily abdominal pain, nausea, but also some additional myalgias and other symptoms. This involves an extensive number of treatment options, and is a complaint that carries with it a high risk of complications and morbidity.  The differential diagnosis includes diverticulitis versus viral illness including COVID or flu versus dehydration versus   I ordered and personally interpreted labs.  The pertinent results include: CMP and CBC unremarkable.  Creatinine near baseline at 1.0.  I doubt she is having a thyroid crisis, explained that I am willing to send thyroid test but they will not likely return today, she will need to follow-up with her PCP for this issue.  She verbalized understanding.  Ua  consistent with UTI - will start on macrobid   I ordered imaging studies including CT abdomen pelvis with contrast I independently visualized and interpreted imaging which showed large stool burden I agree with the radiologist interpretation  The patient was maintained on a cardiac monitor.  I personally viewed and interpreted the cardiac monitored which showed an underlying rhythm of: Sinus rhythm  Per my interpretation the patient's ECG shows sinus rhythm without acute ischemic findings.  A very low suspicion overall for ACS.  I ordered medication including IV Reglan, IV Toradol, IV Benadryl for headache and nausea. I have reviewed the patients home medicines and have made adjustments as needed  Test Considered: Low suspicion for pneumonia, meningitis.  Do not believe she needed an LP at this time   After the interventions noted above, I reevaluated the patient and found that they have: improved   Dispostion:  After consideration of the diagnostic results and the patients response to treatment, I feel that the patent would benefit from antibiotics for UTI, stool cleanout with over-the-counter medications at home.  Patient verbalized understanding and agreement with plan          Final Clinical Impression(s) / ED Diagnoses Final diagnoses:  Constipation, unspecified constipation type  Acute cystitis without hematuria    Rx / DC Orders ED Discharge Orders          Ordered    nitrofurantoin, macrocrystal-monohydrate, (MACROBID) 100 MG capsule  2 times daily        12/14/21 1856              Wyvonnia Dusky, MD 12/14/21 2314

## 2021-12-16 ENCOUNTER — Other Ambulatory Visit: Payer: Self-pay | Admitting: Family Medicine

## 2021-12-17 LAB — URINE CULTURE: Culture: >=100000 — AB

## 2021-12-18 ENCOUNTER — Telehealth: Payer: Self-pay | Admitting: *Deleted

## 2021-12-18 NOTE — Telephone Encounter (Signed)
Post ED Visit - Positive Culture Follow-up  Culture report reviewed by antimicrobial stewardship pharmacist: [facility] []  , Pharm.D. []  Enzo Bi, Pharm.D., BCPS AQ-ID []  , Pharm.D., BCPS []  Celedonio Miyamoto, Pharm.D., BCPS []  Darby, Garvin Fila.D., BCPS, AAHIVP []  , Pharm.D., BCPS, AAHIVP []  Georgina Pillion, PharmD, BCPS []  , PharmD, BCPS []  [place], PharmD, BCPS []  [address], PharmD []  , PharmD, BCPS []  Estella Husk, PharmD  Pharmacy Team []  Lysle Pearl, PharmD []  , PharmD []  Phillips Climes, PharmD []  , Rph []  Agapito Games) , PharmD []  Verlan Friends, PharmD []  , PharmD []  Mervyn Gay, PharmD []  , PharmD []  Vinnie Level, PharmD []  [facility], PharmD []  , PharmD []  Len Childs, PharmD   Positive urine culture Treated with Nitrofurantoin Monohyd Macro, organism sensitive to the same and no further patient follow-up is required at this time.  [facility] 12/18/2021, 11:32 AM

## 2021-12-30 ENCOUNTER — Other Ambulatory Visit: Payer: Self-pay | Admitting: Interventional Cardiology

## 2021-12-31 ENCOUNTER — Ambulatory Visit: Payer: Medicare Other | Admitting: Family

## 2022-01-05 NOTE — Progress Notes (Unsigned)
Office Visit    Patient Name: [name] Date of Encounter: 01/05/2022  PCP:  Shelva Majestic, MD   [facility]  Cardiologist:  Lesleigh Noe, MD  Advanced Practice Provider:  No care team member to display Electrophysiologist:  None    Chief Complaint    [name] is a 76 y.o. female with a hx of nonobstructive CAD on cardiac catheterization in 2018, coronary spasm noted on cardiac cath in 2003, chronic combined systolic and diastolic heart failure with improved LVEF, mitral regurgitation, hypertension, hyperlipidemia, Graves' disease with multinodular goiter (followed by Dr. Everardo All), GERD, and chronic lower back pain who presents today for follow-up visit.  LVEF was 35 to 40% in the setting of positive COVID on 07/2019.  Echocardiogram 11/2019 showed improved LV function to 55 to 60% with mild to moderate MR. She was seen May 2021 for chest pain which was nitroglycerin responsive.  EKG had a short PR interval and concerning for WPW which was consistent with her presenting symptoms.  A 30-day monitor was set up at this time.  Followed up monitor consistent with WPW however she never followed up with Dr. Ladona Ridgel.  She was last seen January 2022 and was reporting good days and bad days.  She was having intermittent left upper chest pain radiating to her arm.  She was taking nitroglycerin frequently with resolution of her symptoms.  Some nonspecific cardiac complaints as well.  She could not say if she was having associated palpitations.  She reported hand swelling.  She was compliant with medication.  Today, she ***   Past Medical History    Past Medical History:  Diagnosis Date   Anemia 12/22/2017   Arthritis    Chronic lower back pain    Cluster headache    Coronary artery disease    a. 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 non obs dz LHC 2007. c. Non obs dz (mild-mod) by [facility] 05/17/14 d. cath 05/26/17 mild non obstructive CAD   COVID-19 08/07/2019    Diverticulitis    a. Hx microperf 2012 - hospitalizated.   GERD (gastroesophageal reflux disease)    Hypercholesteremia    Hypertension    Overactive bladder 09/19/2014   Oxybutynin 5mg  XL--> 10mg    PVC's (premature ventricular contractions)    a. Hx of trigeminy/bigeminy.   Seizures (HCC)    Thyroid disease    hyperthyroid   UTI (lower urinary tract infection)    Past Surgical History:  Procedure Laterality Date   ABDOMINAL HYSTERECTOMY  1987   "partial"-still has ovaries   CARDIAC CATHETERIZATION  05/17/2014   CORONARY ANGIOPLASTY WITH STENT PLACEMENT  1995   "1"   JOINT REPLACEMENT     right knee   LEFT HEART CATH AND CORONARY ANGIOGRAPHY N/A 05/26/2017   Procedure: Left Heart Cath and Coronary Angiography;  Surgeon: 05/19/2014, MD;  Location: [facility] INVASIVE CV LAB;  Service: Cardiovascular;  Laterality: N/A;   LEFT HEART CATHETERIZATION WITH CORONARY ANGIOGRAM N/A 05/17/2014   Procedure: LEFT HEART CATHETERIZATION WITH CORONARY ANGIOGRAM;  Surgeon: [facility], MD;  Location: [facility] CATH LAB;  Service: Cardiovascular;  Laterality: N/A;   TOTAL KNEE ARTHROPLASTY Right 2005   TUBAL LIGATION  1970   VASCULAR SURGERY      Allergies  Allergies  Allergen Reactions   Crestor [Rosuvastatin] Other (See Comments)    Hot flashes and severe cramps   Sulfa Drugs Cross Reactors Shortness Of Breath and Palpitations  EKGs/Labs/Other Studies Reviewed:   The following studies were reviewed today:  12/05/2020 stress test  The left ventricular ejection fraction is normal (55-65%). Nuclear stress EF: 61%. T wave inversion was noted during stress. The study is normal. This is a low risk study.   Normal pharmacologic nuclear stress test with no evidence for prior infarct or ischemia. Normal LVEF.    EKG:  EKG is *** ordered today.  The ekg ordered today demonstrates ***  Recent Labs: 12/14/2021: ALT 39; BUN 15; Creatinine, Ser 1.01; Hemoglobin 11.7; Platelets 220;  Potassium 3.4; Sodium 138; TSH 0.020  Recent Lipid Panel    Component Value Date/Time   CHOL 151 04/03/2021 1424   TRIG 88.0 04/03/2021 1424   HDL 45.30 04/03/2021 1424   CHOLHDL 3 04/03/2021 1424   VLDL 17.6 04/03/2021 1424   LDLCALC 88 04/03/2021 1424   LDLDIRECT 68 11/29/2020 1431    Risk Assessment/Calculations:  {Does this patient have ATRIAL FIBRILLATION?:[phone]}  Home Medications   No outpatient medications have been marked as taking for the 01/06/22 encounter (Appointment) with Sharlene Dory, PA-C.     Review of Systems   ***   All other systems reviewed and are otherwise negative except as noted above.  Physical Exam    VS:  There were no vitals taken for this visit. , BMI There is no height or weight on file to calculate BMI.  Wt Readings from Last 3 Encounters:  07/10/21 155 lb 3.2 oz (70.4 kg)  04/03/21 155 lb 12.8 oz (70.7 kg)  03/10/21 155 lb 12.8 oz (70.7 kg)     GEN: Well nourished, well developed, in no acute distress. HEENT: normal. Neck: Supple, no JVD, carotid bruits, or masses. Cardiac: ***RRR, no murmurs, rubs, or gallops. No clubbing, cyanosis, edema.  ***Radials/PT 2+ and equal bilaterally.  Respiratory:  ***Respirations regular and unlabored, clear to auscultation bilaterally. GI: Soft, nontender, nondistended. MS: No deformity or atrophy. Skin: Warm and dry, no rash. Neuro:  Strength and sensation are intact. Psych: Normal affect.  Assessment & Plan    Chest discomfort with history of coronary vasospasm and nonobstructive CAD by cardiac catheterization 2018 WPW Chronic diastolic heart failure Hypertension       Disposition: Follow up {follow up:15908} with Lesleigh Noe, MD or APP.  Signed, Sharlene Dory, PA-C 01/05/2022, 8:19 PM [facility]

## 2022-01-06 ENCOUNTER — Other Ambulatory Visit: Payer: Self-pay

## 2022-01-06 ENCOUNTER — Ambulatory Visit (INDEPENDENT_AMBULATORY_CARE_PROVIDER_SITE_OTHER): Payer: Medicare Other | Admitting: Physician Assistant

## 2022-01-06 ENCOUNTER — Encounter: Payer: Self-pay | Admitting: Physician Assistant

## 2022-01-06 VITALS — BP 120/72 | HR 52 | Ht 66.0 in | Wt 152.6 lb

## 2022-01-06 DIAGNOSIS — I2510 Atherosclerotic heart disease of native coronary artery without angina pectoris: Secondary | ICD-10-CM | POA: Diagnosis not present

## 2022-01-06 DIAGNOSIS — I5032 Chronic diastolic (congestive) heart failure: Secondary | ICD-10-CM | POA: Diagnosis not present

## 2022-01-06 DIAGNOSIS — E785 Hyperlipidemia, unspecified: Secondary | ICD-10-CM | POA: Diagnosis not present

## 2022-01-06 DIAGNOSIS — I10 Essential (primary) hypertension: Secondary | ICD-10-CM

## 2022-01-06 DIAGNOSIS — I456 Pre-excitation syndrome: Secondary | ICD-10-CM

## 2022-01-06 MED ORDER — METOPROLOL SUCCINATE ER 25 MG PO TB24: ORAL_TABLET | ORAL | 3 refills | Status: DC

## 2022-01-06 MED ORDER — FUROSEMIDE 20 MG PO TABS: 20.0000 mg | ORAL_TABLET | Freq: Every day | ORAL | 3 refills | Status: DC

## 2022-01-06 MED ORDER — ISOSORBIDE MONONITRATE ER 120 MG PO TB24: 120.0000 mg | ORAL_TABLET | Freq: Every day | ORAL | 3 refills | Status: DC

## 2022-01-06 MED ORDER — NITROGLYCERIN 0.4 MG SL SUBL: SUBLINGUAL_TABLET | SUBLINGUAL | 3 refills | Status: DC

## 2022-01-06 MED ORDER — ATORVASTATIN CALCIUM 40 MG PO TABS: ORAL_TABLET | ORAL | 3 refills | Status: DC

## 2022-01-06 MED ORDER — POTASSIUM CHLORIDE ER 10 MEQ PO CPCR: 10.0000 meq | ORAL_CAPSULE | Freq: Two times a day (BID) | ORAL | 3 refills | Status: DC

## 2022-01-06 MED ORDER — AMLODIPINE BESYLATE 10 MG PO TABS: ORAL_TABLET | ORAL | 3 refills | Status: DC

## 2022-01-06 MED ORDER — EZETIMIBE 10 MG PO TABS: ORAL_TABLET | ORAL | 3 refills | Status: DC

## 2022-01-06 NOTE — Patient Instructions (Signed)
Medication Instructions:  ?Your physician recommends that you continue on your current medications as directed. Please refer to the Current Medication list given to you today. ? ?*If you need a refill on your cardiac medications before your next appointment, please call your pharmacy* ? ? ?Lab Work: ?Lipids and lft's with your primary care provider  ?If you have labs (blood work) drawn today and your tests are completely normal, you will receive your results only by: ?MyChart Message (if you have MyChart) OR ?A paper copy in the mail ?If you have any lab test that is abnormal or we need to change your treatment, we will call you to review the results. ? ? ?Testing/Procedures: ?Your physician has requested that you have an echocardiogram. Echocardiography is a painless test that uses sound waves to create images of your heart. It provides your doctor with information about the size and shape of your heart and how well your heart?s chambers and valves are working. This procedure takes approximately one hour. There are no restrictions for this procedure.  ? ? ?Follow-Up: ?At [facility], you and your health needs are our priority.  As part of our continuing mission to provide you with exceptional heart care, we have created designated Provider Care Teams.  These Care Teams include your primary Cardiologist (physician) and Advanced Practice Providers (APPs -  Physician Assistants and Nurse Practitioners) who all work together to provide you with the care you need, when you need it. ? ?We recommend signing up for the patient portal called "MyChart".  Sign up information is provided on this After Visit Summary.  MyChart is used to connect with patients for Virtual Visits (Telemedicine).  Patients are able to view lab/test results, encounter notes, upcoming appointments, etc.  Non-urgent messages can be sent to your provider as well.   ?To learn more about what you can do with MyChart, go to [email or web address].    ? ?Your next appointment:   ?1 year(s) ? ?The format for your next appointment:   ?In Person ? ?Provider:   ?Sinclair Grooms, MD { ?

## 2022-01-20 ENCOUNTER — Ambulatory Visit (HOSPITAL_COMMUNITY): Payer: Medicare Other | Attending: Internal Medicine

## 2022-01-20 ENCOUNTER — Other Ambulatory Visit: Payer: Self-pay

## 2022-01-20 DIAGNOSIS — I2510 Atherosclerotic heart disease of native coronary artery without angina pectoris: Secondary | ICD-10-CM | POA: Diagnosis not present

## 2022-01-20 LAB — ECHOCARDIOGRAM COMPLETE
Area-P 1/2: 4.58 cm2
MV M vel: 5.12 m/s
MV Peak grad: 104.7 mmHg
Radius: 0.8 cm
S' Lateral: 2.85 cm

## 2022-01-26 ENCOUNTER — Telehealth: Payer: Medicare Other

## 2022-01-26 NOTE — Progress Notes (Deleted)
? ?Chronic Care Management ?Pharmacy Note ? ?01/26/2022 ?Name:  [name] MRN:  [id number] DOB:  [birth year] ? ?Summary: ?Initial visit with PharmD.  Patient is taking all of her meds together at night.  Complains of fatigue that does seem to be improving with normalized TSH.  Also complains of symptoms of acid in her stomach/throat. ? ?Recommendations/Changes made from today's visit: ?Recommend she change Nexium to morning dosing separate from other meds on empty stomach, see if this improves symptoms. ? ?Patient is due for DEXA scan and colonoscopy ? ?Plan: ?FU 4 months ? ? ?Subjective: ?[name] is an 76 y.o. year old female who is a primary patient of Hunter, Brayton Mars, MD.  The CCM team was consulted for assistance with disease management and care coordination needs.   ? ?Engaged with patient face to face for initial visit in response to provider referral for pharmacy case management and/or care coordination services.  ? ?Consent to Services:  ?The patient was given the following information about Chronic Care Management services today, agreed to services, and gave verbal consent: 1. CCM service includes personalized support from designated clinical staff supervised by the primary care provider, including individualized plan of care and coordination with other care providers 2. 24/7 contact phone numbers for assistance for urgent and routine care needs. 3. Service will only be billed when office clinical staff spend 20 minutes or more in a month to coordinate care. 4. Only one practitioner may furnish and bill the service in a calendar month. 5.The patient may stop CCM services at any time (effective at the end of the month) by phone call to the office staff. 6. The patient will be responsible for cost sharing (co-pay) of up to 20% of the service fee (after annual deductible is met). Patient agreed to services and consent obtained. ? ?Patient Care Team: ?Marin Olp, MD as PCP - General (Family  Medicine) ?Belva Crome, MD as PCP - Cardiology (Cardiology) ?Edythe Clarity, [facility] (Pharmacist) ? ?Recent office visits:  ?07/10/2021 OV Alyssa Allwardt, PA-C; EKG performed in office today shows no acute changes, very comparable to her previous EKG.  Looking back on her history it looks like these complaints are not uncommon for her.  She is going to contact her cardiologist for follow-up.  ?  ?04/03/2021 OV (PCP) Marin Olp, MD; Suspected mild poor control-patient has not had a chance to reduce from 2 pills to 1 pill per day of methimazole 10 mg (in the past has been as high as 30 mg)- she agrees to make this adjustment today.  ?  ?Recent consult visits:  ?07/17/2021 OV (orthopedics) Garald Balding, MD; pain in left wrist, I think it is worth obtaining an MRI - no medication changes indicated. ?  ?03/10/2021 )V (endocrinology) Renato Shin, MD; Hyperthyroidism: overcontrolled.  reduce the methimazole to 10 mg per day. ?  ?Hospital visits:  ?03/30/2021 ED Visit for unspecified chest pain and weakness,  ?Cardiac workup initiated. ?No medication changes indicated. ? ? ?Objective: ? ?Lab Results  ?Component Value Date  ? CREATININE 1.01 (H) 12/14/2021  ? BUN 15 12/14/2021  ? GFR 48.38 (L) 07/10/2021  ? GFRNONAA 58 (L) 12/14/2021  ? GFRAA 56 (L) 02/04/2020  ? NA 138 12/14/2021  ? K 3.4 (L) 12/14/2021  ? CALCIUM 9.2 12/14/2021  ? CO2 22 12/14/2021  ? GLUCOSE 110 (H) 12/14/2021  ? ? ?Lab Results  ?Component Value Date/Time  ? HGBA1C 6.5 07/10/2021 02:28 PM  ? HGBA1C  6.6 (H) 03/10/2021 03:26 PM  ? GFR 48.38 (L) 07/10/2021 02:28 PM  ? GFR 54.87 (L) 04/03/2021 02:24 PM  ?  ?Last diabetic Eye exam: No results found for: HMDIABEYEEXA  ?Last diabetic Foot exam: No results found for: HMDIABFOOTEX  ? ?Lab Results  ?Component Value Date  ? CHOL 151 04/03/2021  ? HDL 45.30 04/03/2021  ? [place] 88 04/03/2021  ? LDLDIRECT 68 11/29/2020  ? TRIG 88.0 04/03/2021  ? CHOLHDL 3 04/03/2021  ? ? ? ?  Latest Ref Rng & Units  12/14/2021  ?  2:56 PM 07/10/2021  ?  2:28 PM 04/03/2021  ?  2:24 PM  ?Hepatic Function  ?Total Protein 6.5 - 8.1 g/dL 7.3   7.2   7.4    ?Albumin 3.5 - 5.0 g/dL 3.4   4.0   4.1    ?AST 15 - 41 U/L 38   22   18    ?ALT 0 - 44 U/L 39   18   16    ?Alk Phosphatase 38 - 126 U/L 59   69   71    ?Total Bilirubin 0.3 - 1.2 mg/dL 0.7   0.6   0.9    ? ? ?Lab Results  ?Component Value Date/Time  ? TSH 0.020 (L) 12/14/2021 05:06 PM  ? TSH 2.42 07/10/2021 02:28 PM  ? TSH 10.78 (H) 03/10/2021 03:26 PM  ? FREET4 1.34 (H) 12/14/2021 05:06 PM  ? FREET4 0.67 07/10/2021 02:28 PM  ? ? ? ?  Latest Ref Rng & Units 12/14/2021  ?  2:56 PM 07/10/2021  ?  2:28 PM 04/03/2021  ?  2:24 PM  ?CBC  ?WBC 4.0 - 10.5 K/uL 6.5   3.8   3.9    ?Hemoglobin 12.0 - 15.0 g/dL 11.7   12.0   12.7    ?Hematocrit 36.0 - 46.0 % 35.2   36.1   37.7    ?Platelets 150 - 400 K/uL 220   202.0   213.0    ? ? ?No results found for: VD25OH ? ?Clinical ASCVD: Yes  ?The ASCVD Risk score (Arnett DK, et al., 2019) failed to calculate for the following reasons: ?  The patient has a prior MI or stroke diagnosis   ? ? ?  02/06/2021  ?  2:13 PM 11/29/2020  ?  1:49 PM 02/14/2020  ? 10:04 AM  ?Depression screen PHQ 2/9  ?Decreased Interest 0 0 0  ?Down, Depressed, Hopeless 0 0 0  ?PHQ - 2 Score 0 0 0  ?Altered sleeping   2  ?Tired, decreased energy   2  ?Change in appetite   0  ?Feeling bad or failure about yourself    0  ?Trouble concentrating   0  ?Moving slowly or fidgety/restless   0  ?Suicidal thoughts   0  ?PHQ-9 Score   4  ?Difficult doing work/chores   Not difficult at all  ?  ? ?Social History  ? ?Tobacco Use  ?Smoking Status Never  ?Smokeless Tobacco Never  ? ?BP Readings from Last 3 Encounters:  ?01/06/22 120/72  ?12/14/21 133/81  ?07/10/21 110/68  ? ?Pulse Readings from Last 3 Encounters:  ?01/06/22 (!) 52  ?12/14/21 83  ?07/10/21 63  ? ?Wt Readings from Last 3 Encounters:  ?01/06/22 152 lb 9.6 oz (69.2 kg)  ?07/10/21 155 lb 3.2 oz (70.4 kg)  ?04/03/21 155 lb 12.8 oz (70.7 kg)   ? ?BMI Readings from Last 3 Encounters:  ?01/06/22 24.63 kg/m?  ?  07/10/21 25.05 kg/m?  ?04/03/21 25.15 kg/m?  ? ? ?Assessment/Interventions: Review of patient past medical history, allergies, medications, health status, including review of consultants reports, laboratory and other test data, was performed as part of comprehensive evaluation and provision of chronic care management services.  ? ?SDOH:  (Social Determinants of Health) assessments and interventions performed: Yes ? ?Financial Resource Strain: Low Risk   ? Difficulty of Paying Living Expenses: Not hard at all  ? ? ?SDOH Screenings  ? ?Alcohol Screen: Not on file  ?Depression (PHQ2-9): Low Risk   ? PHQ-2 Score: 0  ?Financial Resource Strain: Low Risk   ? Difficulty of Paying Living Expenses: Not hard at all  ?Food Insecurity: No Food Insecurity  ? Worried About Running Out of Food in the Last Year: Never true  ? Ran Out of Food in the Last Year: Never true  ?Housing: Low Risk   ? Last Housing Risk Score: 0  ?Physical Activity: Inactive  ? Days of Exercise per Week: 0 days  ? Minutes of Exercise per Session: 0 min  ?Social Connections: Moderately Isolated  ? Frequency of Communication with Friends and Family: Three times a week  ? Frequency of Social Gatherings with Friends and Family: Never  ? Attends Religious Services: More than 4 times per year  ? Active Member of Clubs or Organizations: No  ? Attends Club or Organization Meetings: Never  ? Marital Status: Widowed  ?Stress: No Stress Concern Present  ? Feeling of Stress : Not at all  ?Tobacco Use: Low Risk   ? Smoking Tobacco Use: Never  ? Smokeless Tobacco Use: Never  ? Passive Exposure: Not on file  ?Transportation Needs: No Transportation Needs  ? Lack of Transportation (Medical): No  ? Lack of Transportation (Non-Medical): No  ? ? ?CCM Care Plan ? ?Allergies  ?Allergen Reactions  ? Crestor [Rosuvastatin] Other (See Comments)  ?  Hot flashes and severe cramps  ? Sulfa Drugs Cross Reactors  Shortness Of Breath and Palpitations  ? ? ?Medications Reviewed Today   ? ? Reviewed by Conte, Tessa N, PA-C (Physician Assistant Certified) on 01/06/22 at 1503  Med List Status: <None>  ? ?Medication Order Taking? S

## 2022-02-12 ENCOUNTER — Ambulatory Visit: Payer: Medicare Other

## 2022-02-12 NOTE — Patient Instructions (Incomplete)
Ms. Ala , ?Thank you for taking time to come for your Medicare Wellness Visit. I appreciate your ongoing commitment to your health goals. Please review the following plan we discussed and let me know if I can assist you in the future.  ? ?Screening recommendations/referrals: ?Colonoscopy: *** ?Mammogram: *** ?Bone Density: *** ?Recommended yearly ophthalmology/optometry visit for glaucoma screening and checkup ?Recommended yearly dental visit for hygiene and checkup ? ?Vaccinations: ?Influenza vaccine: *** ?Pneumococcal vaccine: Up to date ?Tdap vaccine: *** ?Shingles vaccine: Shingrix discussed. Please contact your pharmacy for coverage information.    ?Covid-19:*** ? ?Advanced directives: *** ? ?Conditions/risks identified: *** ? ?Next appointment: Follow up in one year for your annual wellness visit  ? ? ?Preventive Care 45 Years and Older, Female ?Preventive care refers to lifestyle choices and visits with your health care provider that can promote health and wellness. ?What does preventive care include? ?A yearly physical exam. This is also called an annual well check. ?Dental exams once or twice a year. ?Routine eye exams. Ask your health care provider how often you should have your eyes checked. ?Personal lifestyle choices, including: ?Daily care of your teeth and gums. ?Regular physical activity. ?Eating a healthy diet. ?Avoiding tobacco and drug use. ?Limiting alcohol use. ?Practicing safe sex. ?Taking low-dose aspirin every day. ?Taking vitamin and mineral supplements as recommended by your health care provider. ?What happens during an annual well check? ?The services and screenings done by your health care provider during your annual well check will depend on your age, overall health, lifestyle risk factors, and family history of disease. ?Counseling  ?Your health care provider may ask you questions about your: ?Alcohol use. ?Tobacco use. ?Drug use. ?Emotional well-being. ?Home and relationship  well-being. ?Sexual activity. ?Eating habits. ?History of falls. ?Memory and ability to understand (cognition). ?Work and work Statistician. ?Reproductive health. ?Screening  ?You may have the following tests or measurements: ?Height, weight, and BMI. ?Blood pressure. ?Lipid and cholesterol levels. These may be checked every 5 years, or more frequently if you are over 58 years old. ?Skin check. ?Lung cancer screening. You may have this screening every year starting at age 52 if you have a 30-pack-year history of smoking and currently smoke or have quit within the past 15 years. ?Fecal occult blood test (FOBT) of the stool. You may have this test every year starting at age 41. ?Flexible sigmoidoscopy or colonoscopy. You may have a sigmoidoscopy every 5 years or a colonoscopy every 10 years starting at age 71. ?Hepatitis C blood test. ?Hepatitis B blood test. ?Sexually transmitted disease (STD) testing. ?Diabetes screening. This is done by checking your blood sugar (glucose) after you have not eaten for a while (fasting). You may have this done every 1-3 years. ?Bone density scan. This is done to screen for osteoporosis. You may have this done starting at age 90. ?Mammogram. This may be done every 1-2 years. Talk to your health care provider about how often you should have regular mammograms. ?Talk with your health care provider about your test results, treatment options, and if necessary, the need for more tests. ?Vaccines  ?Your health care provider may recommend certain vaccines, such as: ?Influenza vaccine. This is recommended every year. ?Tetanus, diphtheria, and acellular pertussis (Tdap, Td) vaccine. You may need a Td booster every 10 years. ?Zoster vaccine. You may need this after age 64. ?Pneumococcal 13-valent conjugate (PCV13) vaccine. One dose is recommended after age 64. ?Pneumococcal polysaccharide (PPSV23) vaccine. One dose is recommended after age 40. ?Talk  to your health care provider about which  screenings and vaccines you need and how often you need them. ?This information is not intended to replace advice given to you by your health care provider. Make sure you discuss any questions you have with your health care provider. ?Document Released: 11/08/2015 Document Revised: 07/01/2016 Document Reviewed: 08/13/2015 ?Elsevier Interactive Patient Education ? 2017 [place]. ? ?Fall Prevention in the Home ?Falls can cause injuries. They can happen to people of all ages. There are many things you can do to make your home safe and to help prevent falls. ?What can I do on the outside of my home? ?Regularly fix the edges of walkways and driveways and fix any cracks. ?Remove anything that might make you trip as you walk through a door, such as a raised step or threshold. ?Trim any bushes or trees on the path to your home. ?Use bright outdoor lighting. ?Clear any walking paths of anything that might make someone trip, such as rocks or tools. ?Regularly check to see if handrails are loose or broken. Make sure that both sides of any steps have handrails. ?Any raised decks and porches should have guardrails on the edges. ?Have any leaves, snow, or ice cleared regularly. ?Use sand or salt on walking paths during winter. ?Clean up any spills in your garage right away. This includes oil or grease spills. ?What can I do in the bathroom? ?Use night lights. ?Install grab bars by the toilet and in the tub and shower. Do not use towel bars as grab bars. ?Use non-skid mats or decals in the tub or shower. ?If you need to sit down in the shower, use a plastic, non-slip stool. ?Keep the floor dry. Clean up any water that spills on the floor as soon as it happens. ?Remove soap buildup in the tub or shower regularly. ?Attach bath mats securely with double-sided non-slip rug tape. ?Do not have throw rugs and other things on the floor that can make you trip. ?What can I do in the bedroom? ?Use night lights. ?Make sure that you have a  light by your bed that is easy to reach. ?Do not use any sheets or blankets that are too big for your bed. They should not hang down onto the floor. ?Have a firm chair that has side arms. You can use this for support while you get dressed. ?Do not have throw rugs and other things on the floor that can make you trip. ?What can I do in the kitchen? ?Clean up any spills right away. ?Avoid walking on wet floors. ?Keep items that you use a lot in easy-to-reach places. ?If you need to reach something above you, use a strong step stool that has a grab bar. ?Keep electrical cords out of the way. ?Do not use floor polish or wax that makes floors slippery. If you must use wax, use non-skid floor wax. ?Do not have throw rugs and other things on the floor that can make you trip. ?What can I do with my stairs? ?Do not leave any items on the stairs. ?Make sure that there are handrails on both sides of the stairs and use them. Fix handrails that are broken or loose. Make sure that handrails are as long as the stairways. ?Check any carpeting to make sure that it is firmly attached to the stairs. Fix any carpet that is loose or worn. ?Avoid having throw rugs at the top or bottom of the stairs. If you do have throw rugs,  attach them to the floor with carpet tape. ?Make sure that you have a light switch at the top of the stairs and the bottom of the stairs. If you do not have them, ask someone to add them for you. ?What else can I do to help prevent falls? ?Wear shoes that: ?Do not have high heels. ?Have rubber bottoms. ?Are comfortable and fit you well. ?Are closed at the toe. Do not wear sandals. ?If you use a stepladder: ?Make sure that it is fully opened. Do not climb a closed stepladder. ?Make sure that both sides of the stepladder are locked into place. ?Ask someone to hold it for you, if possible. ?Clearly mark and make sure that you can see: ?Any grab bars or handrails. ?First and last steps. ?Where the edge of each step  is. ?Use tools that help you move around (mobility aids) if they are needed. These include: ?Canes. ?Walkers. ?Scooters. ?Crutches. ?Turn on the lights when you go into a dark area. Replace any light bulbs as soon a

## 2022-02-13 ENCOUNTER — Telehealth: Payer: Self-pay | Admitting: Family Medicine

## 2022-02-13 NOTE — Telephone Encounter (Signed)
Req CB in June 2023 ?

## 2022-03-02 ENCOUNTER — Other Ambulatory Visit: Payer: Self-pay | Admitting: Specialist

## 2022-03-02 ENCOUNTER — Ambulatory Visit
Admission: RE | Admit: 2022-03-02 | Discharge: 2022-03-02 | Disposition: A | Discharge status: Home / Self Care | Payer: Medicare Other | Source: Ambulatory Visit | Attending: Specialist | Admitting: Specialist

## 2022-03-02 DIAGNOSIS — M25552 Pain in left hip: Secondary | ICD-10-CM

## 2022-03-02 DIAGNOSIS — M5459 Other low back pain: Secondary | ICD-10-CM

## 2022-03-02 DIAGNOSIS — M545 Low back pain, unspecified: Secondary | ICD-10-CM | POA: Diagnosis not present

## 2022-03-13 DIAGNOSIS — R7303 Prediabetes: Secondary | ICD-10-CM | POA: Diagnosis not present

## 2022-03-13 DIAGNOSIS — E0590 Thyrotoxicosis, unspecified without thyrotoxic crisis or storm: Secondary | ICD-10-CM | POA: Diagnosis not present

## 2022-03-18 ENCOUNTER — Telehealth: Payer: Self-pay | Admitting: Family Medicine

## 2022-03-18 NOTE — Telephone Encounter (Signed)
Copied from CRM [id number]. Topic: Medicare AWV >> Mar 18, 2022  1:00 PM Harris-Coley, Avon Gully wrote: Reason for CRM: Left message for patient to schedule Annual Wellness Visit.  Please schedule with Nurse Health Advisor Lanier Ensign, RN at [facility].  Please call [phone] ask for [facility]

## 2022-04-06 ENCOUNTER — Telehealth: Payer: Self-pay | Admitting: Endocrinology

## 2022-04-06 DIAGNOSIS — E0590 Thyrotoxicosis, unspecified without thyrotoxic crisis or storm: Secondary | ICD-10-CM

## 2022-04-06 NOTE — Telephone Encounter (Signed)
MEDICATION: Methimazole  PHARMACY:  [facility] on [address]  HAS THE PATIENT CONTACTED THEIR PHARMACY?  yes  IS THIS A 90 DAY SUPPLY : no  IS PATIENT OUT OF MEDICATION: yes  IF NOT; HOW MUCH IS LEFT:   LAST APPOINTMENT DATE: @05 /16/2022  NEXT APPOINTMENT DATE:@6 /28/2023  DO WE HAVE YOUR PERMISSION TO LEAVE A DETAILED MESSAGE?: yes

## 2022-04-07 MED ORDER — METHIMAZOLE 10 MG PO TABS: 10.0000 mg | ORAL_TABLET | Freq: Every day | ORAL | 0 refills | Status: DC

## 2022-04-07 NOTE — Telephone Encounter (Signed)
Rx sent to preferred pharmacy.

## 2022-04-09 ENCOUNTER — Telehealth: Payer: Self-pay | Admitting: Pharmacist

## 2022-04-09 NOTE — Progress Notes (Cosign Needed)
Chronic Care Management Pharmacy Assistant   Name: [name]  MRN: [id number] DOB: April 21, 1946   Reason for Encounter: Hypertension Adherence Call    Recent office visits:  None  Recent consult visits:  01/06/2022 OV (Cardiology) Sharlene Dory, PA-C; no medication changes indicated.  Hospital visits:  12/14/2021 ED visit for Constipation -I feel that the patent would benefit from antibiotics for UTI, stool cleanout with over-the-counter medications at home.  Medications: Outpatient Encounter Medications as of 04/09/2022  Medication Sig   Aloe Vera 25 MG CAPS Take by mouth.   amLODipine (NORVASC) 10 MG tablet TAKE 1 TABLET(10 MG) BY MOUTH DAILY   ASPIRIN LOW DOSE PO Take 81 mg by mouth daily.   atorvastatin (LIPITOR) 40 MG tablet TAKE 1 TABLET(40 MG) BY MOUTH DAILY   Cholecalciferol (VITAMIN D3) 25 MCG (1000 UT) CAPS Take by mouth.   Cobalamin Combinations (NEURIVA PLUS) CAPS Take by mouth.   Cyanocobalamin (VITAMIN B-12) 3000 MCG SUBL Place 3,000 mg under the tongue daily.   esomeprazole (NEXIUM) 20 MG capsule Take 1 capsule (20 mg total) by mouth daily.   ezetimibe (ZETIA) 10 MG tablet TAKE 1 TABLET(10 MG) BY MOUTH DAILY   furosemide (LASIX) 20 MG tablet Take 1 tablet (20 mg total) by mouth daily.   isosorbide mononitrate (IMDUR) 120 MG 24 hr tablet Take 1 tablet (120 mg total) by mouth daily.   methimazole (TAPAZOLE) 10 MG tablet Take 1 tablet (10 mg total) by mouth daily.   metoprolol succinate (TOPROL-XL) 25 MG 24 hr tablet TAKE 1 TABLET(25 MG) BY MOUTH DAILY   nitroGLYCERIN (NITROSTAT) 0.4 MG SL tablet PLACE 1 TABLET UNDER THE TONGUE EVERY 5 MINUTES FOR CHEST PAIN FOR UPTO 3 DOSES   oxybutynin (DITROPAN-XL) 10 MG 24 hr tablet TAKE 1 TABLET BY MOUTH EVERY DAY   oxyCODONE-acetaminophen (PERCOCET) 10-325 MG tablet as needed. Taking as needed   potassium chloride (MICRO-K) 10 MEQ CR capsule Take 1 capsule (10 mEq total) by mouth 2 (two) times daily.   Prenatal Vit-Fe  Fumarate-FA (PRENATAL MULTIVITAMIN) TABS tablet Take 1 tablet by mouth daily at 12 noon.   traMADol (ULTRAM) 50 MG tablet TAKE 1 TABLET BY MOUTH EVERY 8 HOURS AS NEEDED FOR MODERATE OR SEVERE PAIN   venlafaxine XR (EFFEXOR-XR) 150 MG 24 hr capsule TAKE 1 CAPSULE BY MOUTH AT BEDTIME   vitamin C (VITAMIN C) 500 MG tablet Take 1 tablet (500 mg total) by mouth 2 (two) times daily.   Vitamin E 180 MG CAPS Take 2 capsules by mouth daily.   zinc sulfate 220 (50 Zn) MG capsule Take 1 capsule (220 mg total) by mouth daily.   No facility-administered encounter medications on file as of 04/09/2022.   Reviewed chart prior to disease state call. Spoke with patient regarding BP  Recent Office Vitals: BP Readings from Last 3 Encounters:  01/06/22 120/72  12/14/21 133/81  07/10/21 110/68   Pulse Readings from Last 3 Encounters:  01/06/22 (!) 52  12/14/21 83  07/10/21 63    Wt Readings from Last 3 Encounters:  01/06/22 152 lb 9.6 oz (69.2 kg)  07/10/21 155 lb 3.2 oz (70.4 kg)  04/03/21 155 lb 12.8 oz (70.7 kg)     Kidney Function Lab Results  Component Value Date/Time   CREATININE 1.01 (H) 12/14/2021 02:56 PM   CREATININE 1.12 07/10/2021 02:28 PM   CREATININE 1.08 (H) 11/29/2020 02:31 PM   CREATININE 0.79 06/04/2017 04:59 PM   GFR 48.38 (L) 07/10/2021 02:28  PM   GFRNONAA 58 (L) 12/14/2021 02:56 PM   GFRAA 56 (L) 02/04/2020 02:57 AM       Latest Ref Rng & Units 12/14/2021    2:56 PM 07/10/2021    2:28 PM 04/03/2021    2:24 PM  BMP  Glucose 70 - 99 mg/dL 102  75  71   BUN 8 - 23 mg/dL 15  17  13    Creatinine 0.44 - 1.00 mg/dL  7.25  3.66   Sodium 135 - 145 mmol/L 138  138  140   Potassium 3.5 - 5.1 mmol/L 3.4  4.0  3.7   Chloride 98 - 111 mmol/L 106  104  105   CO2 22 - 32 mmol/L 22  28  29    Calcium 8.9 - 10.3 mg/dL 9.2  9.2  9.2     Current antihypertensive regimen:  Amlodipine 10 mg daily Imdur 120 mg Furosemide 20 mg Metoprolol Succinate 25 mg  How often are you  checking your Blood Pressure? several times per month  Current home BP readings: 120/70-80  What recent interventions/DTPs have been made by any provider to improve Blood Pressure control since last CPP Visit: No recent interventions or DTPs.  Any recent hospitalizations or ED visits since last visit with CPP? No  What diet changes have been made to improve Blood Pressure Control?  No recent diet changes.  What exercise is being done to improve your Blood Pressure Control?  Patient states she likes to walk  Adherence Review: Is the patient currently on ACE/ARB medication? No Does the patient have >5 day gap between last estimated fill dates? No  Care Gaps: Medicare Annual Wellness: Completed 02/06/2021 Hemoglobin A1C: 6.6% on 07/10/2021 Colonoscopy: Overdue - never done Dexa Scan: Overdue - never done  Future Appointments  Date Time Provider Department Center  04/22/2022  8:10 AM Shamleffer, 07/12/2021, MD [facility] None  05/04/2022 10:30 AM [facility] CCM PHARMACIST [facility]  07/09/2022  1:00 PM 07/05/2022, MD [facility]   Star Rating Drugs: Atorvastatin 40 mg last filled 03/19/2022 90 DS  April D Calhoun, [facility] Clinical Pharmacist Assistant [phone]

## 2022-04-22 ENCOUNTER — Encounter: Payer: Self-pay | Admitting: Internal Medicine

## 2022-04-22 ENCOUNTER — Telehealth: Payer: Self-pay | Admitting: Internal Medicine

## 2022-04-22 ENCOUNTER — Ambulatory Visit (INDEPENDENT_AMBULATORY_CARE_PROVIDER_SITE_OTHER): Payer: Medicare Other | Admitting: Internal Medicine

## 2022-04-22 VITALS — BP 118/70 | HR 84 | Ht 66.0 in | Wt 151.4 lb

## 2022-04-22 DIAGNOSIS — E0500 Thyrotoxicosis with diffuse goiter without thyrotoxic crisis or storm: Secondary | ICD-10-CM

## 2022-04-22 DIAGNOSIS — E0590 Thyrotoxicosis, unspecified without thyrotoxic crisis or storm: Secondary | ICD-10-CM | POA: Diagnosis not present

## 2022-04-22 LAB — TSH: TSH: 0.01 u[IU]/mL — ABNORMAL LOW (ref 0.35–5.50)

## 2022-04-22 LAB — T4, FREE: Free T4: 1.08 ng/dL (ref 0.60–1.60)

## 2022-04-22 LAB — T3: T3, Total: 120 ng/dL (ref 76–181)

## 2022-04-22 MED ORDER — METHIMAZOLE 10 MG PO TABS: 10.0000 mg | ORAL_TABLET | Freq: Two times a day (BID) | ORAL | 1 refills | Status: DC

## 2022-04-22 NOTE — Progress Notes (Signed)
Name: [name]  MRN/ DOB: [id number], 1946/03/24    Age/ Sex: 76 y.o., female     PCP: Shelva Majestic, MD   Reason for Endocrinology Evaluation: Hyperthyroidism     Initial Endocrinology Clinic Visit: 06/10/2017    PATIENT IDENTIFIER: [name] is a 76 y.o., female with a past medical history of HTN, hyperthyroid, PVCs, CAD and dyslipidemia. She has followed with  Endocrinology clinic since 06/10/2017 for consultative assistance with management of her Hyperthyroidism.   HISTORICAL SUMMARY: The patient was first diagnosed with Hyperthyroidism in 2012.  This has been attributed to Graves' disease she has been on Methimazole since her dx.    Thyroid ultrasound in 2018 was unrevealing TSI elevated >700   Sister with thyroid disease   SUBJECTIVE:     Today (04/22/2022):  [name] is here for a follow up on hyperthyroid management.   Weight has been decreasing  Methimazole 10 mg, 3 tabs daily at night  Denies anxiety and jittery sensation  Denies palpitations  Denies loose stools or diarrhea , mostly has constipation which is chronic  Minimal local swelling      HISTORY:  Past Medical History:  Past Medical History:  Diagnosis Date   Anemia 12/22/2017   Arthritis    Chronic lower back pain    Cluster headache    Coronary artery disease    a. 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 non obs dz LHC 2007. c. Non obs dz (mild-mod) by [facility] 05/17/14 d. cath 05/26/17 mild non obstructive CAD   COVID-19 08/07/2019   Diverticulitis    a. Hx microperf 2012 - hospitalizated.   GERD (gastroesophageal reflux disease)    Hypercholesteremia    Hypertension    Overactive bladder 09/19/2014   Oxybutynin 5mg  XL--> 10mg    PVC's (premature ventricular contractions)    a. Hx of trigeminy/bigeminy.   Seizures (HCC)    Thyroid disease    hyperthyroid   UTI (lower urinary tract infection)    Past Surgical History:  Past Surgical History:  Procedure Laterality Date   ABDOMINAL  HYSTERECTOMY  1987   "partial"-still has ovaries   CARDIAC CATHETERIZATION  05/17/2014   CORONARY ANGIOPLASTY WITH STENT PLACEMENT  1995   "1"   JOINT REPLACEMENT     right knee   LEFT HEART CATH AND CORONARY ANGIOGRAPHY N/A 05/26/2017   Procedure: Left Heart Cath and Coronary Angiography;  Surgeon: 05/19/2014, MD;  Location: [facility] INVASIVE CV LAB;  Service: Cardiovascular;  Laterality: N/A;   LEFT HEART CATHETERIZATION WITH CORONARY ANGIOGRAM N/A 05/17/2014   Procedure: LEFT HEART CATHETERIZATION WITH CORONARY ANGIOGRAM;  Surgeon: [facility], MD;  Location: [facility] CATH LAB;  Service: Cardiovascular;  Laterality: N/A;   TOTAL KNEE ARTHROPLASTY Right 2005   TUBAL LIGATION  1970   VASCULAR SURGERY     Social History:  reports that she has never smoked. She has never used smokeless tobacco. She reports that she does not drink alcohol and does not use drugs. Family History:  Family History  Problem Relation Age of Onset   CAD Brother    Diabetes Brother    CAD Father    Diabetes Father    Diabetes Mother        father, sister, brothers   Diabetes Sister    Thyroid disease Sister    Heart attack Brother    Prostate cancer Brother    Thyroid disease Sister    Diabetes Sister      HOME MEDICATIONS:  Allergies as of 04/22/2022       Reactions   Crestor [rosuvastatin] Other (See Comments)   Hot flashes and severe cramps   Sulfa Drugs Cross Reactors Shortness Of Breath, Palpitations        Medication List        Accurate as of April 22, 2022  5:19 PM. If you have any questions, ask your nurse or doctor.          Aloe Vera 25 MG Caps Take by mouth.   amLODipine 10 MG tablet Commonly known as: NORVASC TAKE 1 TABLET(10 MG) BY MOUTH DAILY   ascorbic acid 500 MG tablet Commonly known as: VITAMIN C Take 1 tablet (500 mg total) by mouth 2 (two) times daily.   ASPIRIN LOW DOSE PO Take 81 mg by mouth daily.   atorvastatin 40 MG tablet Commonly known as:  LIPITOR TAKE 1 TABLET(40 MG) BY MOUTH DAILY   esomeprazole 20 MG capsule Commonly known as: NexIUM Take 1 capsule (20 mg total) by mouth daily.   ezetimibe 10 MG tablet Commonly known as: ZETIA TAKE 1 TABLET(10 MG) BY MOUTH DAILY   furosemide 20 MG tablet Commonly known as: LASIX Take 1 tablet (20 mg total) by mouth daily.   isosorbide mononitrate 120 MG 24 hr tablet Commonly known as: IMDUR Take 1 tablet (120 mg total) by mouth daily.   methimazole 10 MG tablet Commonly known as: TAPAZOLE Take 1 tablet (10 mg total) by mouth daily.   metoprolol succinate 25 MG 24 hr tablet Commonly known as: TOPROL-XL TAKE 1 TABLET(25 MG) BY MOUTH DAILY   Neuriva Plus Caps Take by mouth.   nitroGLYCERIN 0.4 MG SL tablet Commonly known as: NITROSTAT PLACE 1 TABLET UNDER THE TONGUE EVERY 5 MINUTES FOR CHEST PAIN FOR UPTO 3 DOSES   oxybutynin 10 MG 24 hr tablet Commonly known as: DITROPAN-XL TAKE 1 TABLET BY MOUTH EVERY DAY   oxyCODONE-acetaminophen 10-325 MG tablet Commonly known as: PERCOCET as needed. Taking as needed   potassium chloride 10 MEQ CR capsule Commonly known as: MICRO-K Take 1 capsule (10 mEq total) by mouth 2 (two) times daily.   prenatal multivitamin Tabs tablet Take 1 tablet by mouth daily at 12 noon.   traMADol 50 MG tablet Commonly known as: ULTRAM TAKE 1 TABLET BY MOUTH EVERY 8 HOURS AS NEEDED FOR MODERATE OR SEVERE PAIN   venlafaxine XR 150 MG 24 hr capsule Commonly known as: EFFEXOR-XR TAKE 1 CAPSULE BY MOUTH AT BEDTIME   Vitamin B-12 3000 MCG Subl Place 3,000 mg under the tongue daily.   Vitamin D3 25 MCG (1000 UT) Caps Take by mouth.   Vitamin E 180 MG Caps Take 2 capsules by mouth daily.   zinc sulfate 220 (50 Zn) MG capsule Take 1 capsule (220 mg total) by mouth daily.          OBJECTIVE:   PHYSICAL EXAM: VS: BP 118/70 (BP Location: Left Arm, Patient Position: Sitting, Cuff Size: Small)   Pulse 84   Ht 5\' 6"  (1.676 m)   Wt 151  lb 6.4 oz (68.7 kg)   SpO2 96%   BMI 24.44 kg/m    EXAM: General: Pt appears well and is in NAD  Eyes: External eye exam normal without stare, lid lag or exophthalmos.  EOM intact.  Neck: General: Supple without adenopathy. Thyroid: Thyroid size normal.  No goiter or nodules appreciated. No thyroid bruit.  Lungs: Clear with good BS bilat with no rales, rhonchi, or wheezes  Heart: Auscultation: RRR.  Abdomen: Normoactive bowel sounds, soft, nontender, without masses or organomegaly palpable  Extremities:  BL LE: No pretibial edema normal ROM and strength.  Mental Status: Judgment, insight: Intact Orientation: Oriented to time, place, and person Mood and affect: No depression, anxiety, or agitation     DATA REVIEWED:  Latest Reference Range & Units 04/22/22 08:26  TSH 0.35 - 5.50 uIU/mL 0.01 Repeated and verified X2. (L)  T4,Free(Direct) 0.60 - 1.60 ng/dL 1.96     TSI <222 % baseline >700 High      Thyroid ultrasound 05/23/2017  FINDINGS: Parenchymal Echotexture: Mildly heterogenous, hyperemic   Isthmus: 0.2 cm thickness   Right lobe: 4.6 x 2.3 x 1.8 cm   Left lobe: 4.4 x 2 x 1.4 cm   _________________________________________________________   Estimated total number of nodules >/= 1 cm: 0   Number of spongiform nodules >/=  2 cm not described below (TR1): 0   Number of mixed cystic and solid nodules >/= 1.5 cm not described below (TR2): 0   _________________________________________________________   No discrete nodules are seen within the thyroid gland.   IMPRESSION: 1. Heterogeneous hyperemic thyroid, normal in size without nodule.   ASSESSMENT / PLAN / RECOMMENDATIONS:   Hyperthyroidism:   -The patient is clinically hypothyroid - No local neck symptoms  -I am not clear about how much methimazole she is taking.  She tells me she has been taking 3 tablets at night but her latest refill was for 1 tablet daily #30 -I am going to adjust as below and see  how she does  Medications  Methimazole 10 mg twice a day    2.  Graves' disease:  - No extra thyroidal manifestations of Graves' disease    F/U in 4 months  Labs in 8 weeks      Signed electronically by: Lyndle Herrlich, MD  [facility] Endocrinology  [facility] [address] [place] [place], [place] [address] Phone: [phone] FAX: [phone]      CC: Shelva Majestic, MD [address] [place] [address] Phone: [phone]  Fax: [phone]   Return to Endocrinology clinic as below: Future Appointments  Date Time Provider Department Center  05/04/2022 10:30 AM [facility] CCM PHARMACIST [facility]  07/09/2022  1:00 PM Shelva Majestic, MD [facility]  08/26/2022  8:10 AM Kamdyn Covel, Konrad Dolores, MD [facility] None

## 2022-04-22 NOTE — Telephone Encounter (Signed)
Please let the patient know that her thyroid function is slightly overactive and to take methimazole 10 mg twice a day   She needs to separate those pills and to take 1 tablet in the morning and 1 tablet in the evening    Thanks

## 2022-04-23 NOTE — Telephone Encounter (Signed)
LMTCB

## 2022-04-24 NOTE — Telephone Encounter (Signed)
LMTCB

## 2022-04-27 NOTE — Telephone Encounter (Signed)
LMTCB

## 2022-04-27 NOTE — Telephone Encounter (Signed)
Called and advised pt of results and provider's recommendation. Pt verbalized understanding.

## 2022-04-27 NOTE — Progress Notes (Signed)
Chronic Care Management Pharmacy Note  05/04/2022 Name:  [name] MRN:  [id number] DOB:  11-06-1945  Summary: PharmD follow up.  TSH decreased at last Endo visit.  Dosing history of methimazole is unclear.  Today she tells me she is taking 90m daily.  Endo notes after labs suggest 231mdaily in split doses.  Discussed with endo and we agree 4064mill be too much.  Recommendations/Changes made from today's visit: Methimazole 49m38mD, recheck TSH in 6 weeks  Patient is due for DEXA scan and colonoscopy  Plan: FU 6 months HC call in 3 months to check TSH update   Subjective: [name] 76 y16. year old female who is a primary patient of Hunter, StepBrayton Mars.  The CCM team was consulted for assistance with disease management and care coordination needs.    Engaged with patient face to face for follow up visit in response to provider referral for pharmacy case management and/or care coordination services.   Consent to Services:  The patient was given the following information about Chronic Care Management services today, agreed to services, and gave verbal consent: 1. CCM service includes personalized support from designated clinical staff supervised by the primary care provider, including individualized plan of care and coordination with other care providers 2. 24/7 contact phone numbers for assistance for urgent and routine care needs. 3. Service will only be billed when office clinical staff spend 20 minutes or more in a month to coordinate care. 4. Only one practitioner may furnish and bill the service in a calendar month. 5.The patient may stop CCM services at any time (effective at the end of the month) by phone call to the office staff. 6. The patient will be responsible for cost sharing (co-pay) of up to 20% of the service fee (after annual deductible is met). Patient agreed to services and consent obtained.  Patient Care Team: HuntMarin Olp as PCP - General  (Family Medicine) SmitBelva Crome as PCP - Cardiology (Cardiology) Dav[facility]Edythe Clarity[facility])  Recent office visits:  07/10/2021 OV Alyssa Allwardt, PA-C; EKG performed in office today shows no acute changes, very comparable to her previous EKG.  Looking back on her history it looks like these complaints are not uncommon for her.  She is going to contact her cardiologist for follow-up.    04/03/2021 OV (PCP) HuntMarin Olp; Suspected mild poor control-patient has not had a chance to reduce from 2 pills to 1 pill per day of methimazole 10 mg (in the past has been as high as 30 mg)- she agrees to make this adjustment today.    Recent consult visits:  07/17/2021 OV (orthopedics) WhitGarald Balding; pain in left wrist, I think it is worth obtaining an MRI - no medication changes indicated.   03/10/2021 )V (endocrinology) ElliRenato Shin; Hyperthyroidism: overcontrolled.  reduce the methimazole to 10 mg per day.   Hospital visits:  03/30/2021 ED Visit for unspecified chest pain and weakness,  Cardiac workup initiated. No medication changes indicated.   Objective:  Lab Results  Component Value Date   CREATININE 1.01 (H) 12/14/2021   BUN 15 12/14/2021   GFR 48.38 (L) 07/10/2021   GFRNONAA 58 (L) 12/14/2021   GFRAA 56 (L) 02/04/2020   NA 138 12/14/2021   K 3.4 (L) 12/14/2021   CALCIUM 9.2 12/14/2021   CO2 22 12/14/2021   GLUCOSE 110 (H) 12/14/2021    Lab Results  Component Value  Date/Time   HGBA1C 6.5 07/10/2021 02:28 PM   HGBA1C 6.6 (H) 03/10/2021 03:26 PM   GFR 48.38 (L) 07/10/2021 02:28 PM   GFR 54.87 (L) 04/03/2021 02:24 PM    Last diabetic Eye exam: No results found for: "HMDIABEYEEXA"  Last diabetic Foot exam: No results found for: "HMDIABFOOTEX"   Lab Results  Component Value Date   CHOL 151 04/03/2021   HDL 45.30 04/03/2021   LDLCALC 88 04/03/2021   LDLDIRECT 68 11/29/2020   TRIG 88.0 04/03/2021   CHOLHDL 3 04/03/2021       Latest Ref  Rng & Units 12/14/2021    2:56 PM 07/10/2021    2:28 PM 04/03/2021    2:24 PM  Hepatic Function  Total Protein 6.5 - 8.1 g/dL 7.3  7.2  7.4   Albumin 3.5 - 5.0 g/dL 3.4  4.0  4.1   AST 15 - 41 U/L 38  22  18   ALT 0 - 44 U/L 39  18  16   Alk Phosphatase 38 - 126 U/L 59  69  71   Total Bilirubin 0.3 - 1.2 mg/dL 0.7  0.6  0.9     Lab Results  Component Value Date/Time   TSH 0.01 Repeated and verified X2. (L) 04/22/2022 08:26 AM   TSH 0.020 (L) 12/14/2021 05:06 PM   TSH 2.42 07/10/2021 02:28 PM   FREET4 1.08 04/22/2022 08:26 AM   FREET4 1.34 (H) 12/14/2021 05:06 PM       Latest Ref Rng & Units 12/14/2021    2:56 PM 07/10/2021    2:28 PM 04/03/2021    2:24 PM  CBC  WBC 4.0 - 10.5 K/uL 6.5  3.8  3.9   Hemoglobin 12.0 - 15.0 g/dL 11.7  12.0  12.7   Hematocrit 36.0 - 46.0 % 35.2  36.1  37.7   Platelets 150 - 400 K/uL 220  202.0  213.0     No results found for: "VD25OH"  Clinical ASCVD: Yes  The ASCVD Risk score (Arnett DK, et al., 2019) failed to calculate for the following reasons:   The patient has a prior MI or stroke diagnosis       02/06/2021    2:13 PM 11/29/2020    1:49 PM 02/14/2020   10:04 AM  Depression screen PHQ 2/9  Decreased Interest 0 0 0  Down, Depressed, Hopeless 0 0 0  PHQ - 2 Score 0 0 0  Altered sleeping   2  Tired, decreased energy   2  Change in appetite   0  Feeling bad or failure about yourself    0  Trouble concentrating   0  Moving slowly or fidgety/restless   0  Suicidal thoughts   0  PHQ-9 Score   4  Difficult doing work/chores   Not difficult at all     Social History   Tobacco Use  Smoking Status Never  Smokeless Tobacco Never   BP Readings from Last 3 Encounters:  04/22/22 118/70  01/06/22 120/72  12/14/21 133/81   Pulse Readings from Last 3 Encounters:  04/22/22 84  01/06/22 (!) 52  12/14/21 83   Wt Readings from Last 3 Encounters:  04/22/22 151 lb 6.4 oz (68.7 kg)  01/06/22 152 lb 9.6 oz (69.2 kg)  07/10/21 155 lb 3.2 oz  (70.4 kg)   BMI Readings from Last 3 Encounters:  04/22/22 24.44 kg/m  01/06/22 24.63 kg/m  07/10/21 25.05 kg/m    Assessment/Interventions: Review of patient past  medical history, allergies, medications, health status, including review of consultants reports, laboratory and other test data, was performed as part of comprehensive evaluation and provision of chronic care management services.   SDOH:  (Social Determinants of Health) assessments and interventions performed: Yes  Food Insecurity: No Food Insecurity (02/06/2021)   Hunger Vital Sign    Worried About Running Out of Food in the Last Year: Never true    Ran Out of Food in the Last Year: Never true    Financial Resource Strain: Low Risk  (02/06/2021)   Overall Financial Resource Strain (CARDIA)    Difficulty of Paying Living Expenses: Not hard at all    SDOH Screenings   Alcohol Screen: Not on file  Depression (PHQ2-9): Low Risk  (02/06/2021)   Depression (PHQ2-9)    PHQ-2 Score: 0  Financial Resource Strain: Low Risk  (02/06/2021)   Overall Financial Resource Strain (CARDIA)    Difficulty of Paying Living Expenses: Not hard at all  Food Insecurity: No Food Insecurity (02/06/2021)   Hunger Vital Sign    Worried About Running Out of Food in the Last Year: Never true    Ran Out of Food in the Last Year: Never true  Housing: Low Risk  (02/06/2021)   Housing    Last Housing Risk Score: 0  Physical Activity: Inactive (02/06/2021)   Exercise Vital Sign    Days of Exercise per Week: 0 days    Minutes of Exercise per Session: 0 min  Social Connections: Moderately Isolated (02/06/2021)   Social Connection and Isolation Panel [NHANES]    Frequency of Communication with Friends and Family: Three times a week    Frequency of Social Gatherings with Friends and Family: Never    Attends Religious Services: More than 4 times per year    Active Member of Clubs or Organizations: No    Attends Archivist Meetings: Never     Marital Status: Widowed  Stress: No Stress Concern Present (02/06/2021)   [place]    Feeling of Stress : Not at all  Tobacco Use: Low Risk  (04/22/2022)   Patient History    Smoking Tobacco Use: Never    Smokeless Tobacco Use: Never    Passive Exposure: Not on file  Transportation Needs: No Transportation Needs (02/06/2021)   PRAPARE - Transportation    Lack of Transportation (Medical): No    Lack of Transportation (Non-Medical): No    CCM Care Plan  Allergies  Allergen Reactions   Crestor [Rosuvastatin] Other (See Comments)    Hot flashes and severe cramps   Sulfa Drugs Cross Reactors Shortness Of Breath and Palpitations    Medications Reviewed Today     Reviewed by Edythe Clarity, [facility] (Pharmacist) on 05/04/22 at 1327  Med List Status: <None>   Medication Order Taking? Sig Documenting Provider Last Dose Status Informant  Aloe Vera 25 MG CAPS [id number] No Take by mouth. [provider] Taking Active   amLODipine (NORVASC) 10 MG tablet [id number] No TAKE 1 TABLET(10 MG) BY MOUTH DAILY Miguel Aschoff Taking Active   ASPIRIN LOW DOSE PO [id number] No Take 81 mg by mouth daily. [provider] Taking Active Self  atorvastatin (LIPITOR) 40 MG tablet [id number] No TAKE 1 TABLET(40 MG) BY MOUTH DAILY Harriet Pho, Tessa N, PA-C Taking Active   Cholecalciferol (VITAMIN D3) 25 MCG (1000 UT) CAPS [id number] No Take by mouth. [provider] Taking Active   Cobalamin Combinations (  NEURIVA PLUS) CAPS [id number] No Take by mouth. [provider] Taking Active   Cyanocobalamin (VITAMIN B-12) 3000 MCG SUBL [id number] No Place 3,000 mg under the tongue daily. [provider] Taking Active   esomeprazole (NEXIUM) 20 MG capsule [id number] No Take 1 capsule (20 mg total) by mouth daily. Marin Olp, MD Taking Active   ezetimibe (ZETIA) 10 MG tablet [id number] No TAKE 1 TABLET(10 MG) BY  MOUTH DAILY Harriet Pho, Tessa N, PA-C Taking Active   furosemide (LASIX) 20 MG tablet [id number] No Take 1 tablet (20 mg total) by mouth daily. Elgie Collard, PA-C Taking Active   isosorbide mononitrate (IMDUR) 120 MG 24 hr tablet [id number] No Take 1 tablet (120 mg total) by mouth daily. Elgie Collard, PA-C Taking Active   methimazole (TAPAZOLE) 10 MG tablet [id number]  Take 1 tablet (10 mg total) by mouth 2 (two) times daily. Shamleffer, Melanie Crazier, MD  Active   metoprolol succinate (TOPROL-XL) 25 MG 24 hr tablet [id number] No TAKE 1 TABLET(25 MG) BY MOUTH DAILY Conte, Tessa N, PA-C Taking Active   nitroGLYCERIN (NITROSTAT) 0.4 MG SL tablet [id number] No PLACE 1 TABLET UNDER THE TONGUE EVERY 5 MINUTES FOR CHEST PAIN FOR UPTO 3 DOSES Elgie Collard, PA-C Taking Active   oxybutynin (DITROPAN-XL) 10 MG 24 hr tablet [id number] No TAKE 1 TABLET BY MOUTH EVERY DAY Marin Olp, MD Taking Active   oxyCODONE-acetaminophen (PERCOCET) 10-325 MG tablet [id number] No as needed. Taking as needed [provider] Taking Active   potassium chloride (MICRO-K) 10 MEQ CR capsule [id number] No Take 1 capsule (10 mEq total) by mouth 2 (two) times daily. Elgie Collard, PA-C Taking Active   Prenatal Vit-Fe Fumarate-FA (PRENATAL MULTIVITAMIN) TABS tablet [id number] No Take 1 tablet by mouth daily at 12 noon. [provider] Taking Active Self  traMADol (ULTRAM) 50 MG tablet [id number] No TAKE 1 TABLET BY MOUTH EVERY 8 HOURS AS NEEDED FOR MODERATE OR SEVERE PAIN Marin Olp, MD Taking Active   venlafaxine XR (EFFEXOR-XR) 150 MG 24 hr capsule [id number] No TAKE 1 CAPSULE BY MOUTH AT BEDTIME Marin Olp, MD Taking Active   vitamin C (VITAMIN C) 500 MG tablet [id number] No Take 1 tablet (500 mg total) by mouth 2 (two) times daily. Shelly Coss, MD Taking Active Self  Vitamin E 180 MG CAPS [id number] No Take 2 capsules by mouth daily. [provider] Taking Active Self  zinc sulfate 220 (50  Zn) MG capsule [id number] No Take 1 capsule (220 mg total) by mouth daily. Shelly Coss, MD Taking Active Self            Patient Active Problem List   Diagnosis Date Noted   Pain in left wrist 07/17/2021   WPW (Wolff-Parkinson-White syndrome) 03/05/2020   Chest pain 02/03/2020   Aortic atherosclerosis ([place]) 01/09/2020   Heart failure with reduced ejection fraction ([place]) 08/21/2019   COVID-19 08/07/2019   Anemia 12/22/2017   Hyperthyroidism 05/22/2017   Overactive bladder 09/19/2014   Migraine 09/19/2014   Hyperglycemia 09/19/2014   Hypokalemia 06/01/2014   Coronary artery disease involving native coronary artery of native heart with angina pectoris ([place])    Seizures ([place])    Arthritis    Hypercholesteremia    GERD (gastroesophageal reflux disease) 10/04/2011   HTN (hypertension) 10/01/2011    Immunization History  Administered Date(s) Administered   Influenza, High Dose Seasonal PF 08/17/2016, 08/06/2017   Influenza,inj,Quad PF,6+ Mos 10/11/2015   Pneumococcal Conjugate-13 10/11/2015  Pneumococcal Polysaccharide-23 11/19/2016   Td 12/25/1995    Conditions to be addressed/monitored:  HTN, HFrEF, CAD, GERD, Hyperthyroidism, HLD, Anemia  Care Plan : General Pharmacy (Adult)  Updates made by Edythe Clarity, RPH since 05/04/2022 12:00 AM     Problem: HTN, HFrEF, CAD, GERD, Hyperthyroidism, HLD, Anemia   Priority: High  Onset Date: 07/29/2021     Long-Range Goal: Patient-Specific Goal   Start Date: 07/29/2021  Expected End Date: 01/27/2022  Recent Progress: On track  Priority: High  Note:   Current Barriers:  TSH is decreased  Pharmacist Clinical Goal(s):  Patient will maintain control of TSH and BP as evidenced by monitoring/labs  through collaboration with PharmD and provider.   Interventions: 1:1 collaboration with Marin Olp, MD regarding development and update of comprehensive plan of care as evidenced by provider attestation and  co-signature Inter-disciplinary care team collaboration (see longitudinal plan of care) Comprehensive medication review performed; medication list updated in electronic medical record  Hypertension (BP goal <130/80) -Controlled, not assessed today -Current treatment: Amlodipine 22m daily Isosorbide 1244m24hr daily Metoprolol Xl 2530maily -Medications previously tried: propronolol  -Current home readings: not checking -Current exercise habits: very active around the house, "always moving" no organized physical activity -Reports hypotensive/hypertensive symptoms - dizziness occasionally at random times -Educated on BP goals and benefits of medications for prevention of heart attack, stroke and kidney damage; Importance of home blood pressure monitoring; Symptoms of hypotension and importance of maintaining adequate hydration; -Counseled to monitor BP at home as able, document, and provide log at future appointments -Recommended to continue current medication Report dizziness to providers  Hyperlipidemia: (LDL goal < 70) -Controlled, not assessed today -Current treatment: Atorvastatin 29m57mtia 10mg66mly -Medications previously tried: Rosuvastatin (cramps)  -Educated on Cholesterol goals;  Benefits of statin for ASCVD risk reduction; Importance of limiting foods high in cholesterol; Most recent LDL is controlled -Recommended to continue current medication  Heart Failure (Goal: manage symptoms and prevent exacerbations) -Controlled, not assessed today -Last ejection fraction: 55-60%  -Current treatment: Furosemide 20mg 52my Metoprolol XL 25mg d50m Imdur 120mg 2444maily -Medications previously tried: none noted  -Current home BP/HR readings: not checking, normal in office -Educated on Benefits of medications for managing symptoms and prolonging life Importance of weighing daily; if you gain more than 3 pounds in one day or 5 pounds in one week, contact providers Proper  diuretic administration and potassium supplementation -Recommended to continue current medication  GERD (Goal: Control symptoms) 05/04/22 -Not ideally controlled -Current treatment  Esomeprazole 20mg dai80mppropriate, Query effective,  -Medications previously tried: none noted -Did switch to morning dosing, still having to take the gas pills.  Mentions her symptoms have improved somewhat. She never completed DEXA scan, would recommend due to long term PPI use and age. -Recommended to continue current medication, work on trigger foods for GERD.  Initial Visit Recommend she take medication in the am on empty stomach separate from other meds.  See if this helps symptoms.  Hyperthyroidism (Goal: Maintain TSH) 05/04/22 -Controlled -Current treatment  Methimazole 10mg Appr73mate, Query effective, -Medications previously tried: none noted -History of dosing is a little unclear.  Today, she tells me that she is taking two tablets twice daily for a total of 29mg per d77m Based on the telephone note from 6/28, she was supposed to be taking one tablet twice daily for total of 20mg daily.77mH was very low at last labs. She does not return for follow up until  September.  I discussed proper dosing with endocrine.  We are in agreement that 84m daily will be too much. Will update the patient on proper directions of 261mdaily.  1089mm and 63m36m.  Recheck TSH when she goes for FU visit. No other changes to medication at this time.    Anemia (Goal: Maintain Hgb) -Controlled, not assessed today -Current treatment  None noted -Medications previously tried: none noted  -Last Hgb is normal at 12.0 -Recommended to continue current medication  Patient Goals/Self-Care Activities Patient will:  - take medications as prescribed focus on medication adherence by pill counts check blood pressure periodically, document, and provide at future appointments  Follow Up Plan: The care management team  will reach out to the patient again over the next 180 days.             Medication Assistance: None required.  Patient affirms current coverage meets needs.  Compliance/Adherence/Medication fill history: Care Gaps: DEXA Scan Colonoscopy  Star Meds: Atorvastatin 40mg31m25/23 90ds  Patient's preferred pharmacy is:  [facility] [id number]: [phone] pill box? No - sets them out each night Pt endorses 100% compliance  We discussed: Benefits of medication synchronization, packaging and delivery as well as enhanced pharmacist oversight with [employer]. Patient decided to: Continue current medication management strategy  Care Plan and Follow Up Patient Decision:  Patient agrees to Care Plan and Follow-up.  Plan: The care management team will reach out to the patient again over the next 120 days.  ChrisBeverly MilchrmD Clinical Pharmacist [phone]

## 2022-05-04 ENCOUNTER — Ambulatory Visit: Payer: Medicare Other | Admitting: Pharmacist

## 2022-05-04 DIAGNOSIS — E0590 Thyrotoxicosis, unspecified without thyrotoxic crisis or storm: Secondary | ICD-10-CM

## 2022-05-04 DIAGNOSIS — K219 Gastro-esophageal reflux disease without esophagitis: Secondary | ICD-10-CM

## 2022-05-04 NOTE — Patient Instructions (Addendum)
Visit Information   Goals Addressed             This Visit's Progress    Track and Manage My Blood Pressure-Hypertension   On track    Timeframe:  Long-Range Goal Priority:  High Start Date:   07/29/21                          Expected End Date: 01/27/22                      Follow Up Date 10/29/21    - check blood pressure weekly - choose a place to take my blood pressure (home, clinic or office, retail store) - write blood pressure results in a log or diary    Why is this important?   You won't feel high blood pressure, but it can still hurt your blood vessels.  High blood pressure can cause heart or kidney problems. It can also cause a stroke.  Making lifestyle changes like losing a little weight or eating less salt will help.  Checking your blood pressure at home and at different times of the day can help to control blood pressure.  If the doctor prescribes medicine remember to take it the way the doctor ordered.  Call the office if you cannot afford the medicine or if there are questions about it.     Notes:        Patient Care Plan: General Pharmacy (Adult)     Problem Identified: HTN, HFrEF, CAD, GERD, Hyperthyroidism, HLD, Anemia   Priority: High  Onset Date: 07/29/2021     Long-Range Goal: Patient-Specific Goal   Start Date: 07/29/2021  Expected End Date: 01/27/2022  Recent Progress: On track  Priority: High  Note:   Current Barriers:  TSH is decreased  Pharmacist Clinical Goal(s):  Patient will maintain control of TSH and BP as evidenced by monitoring/labs  through collaboration with PharmD and provider.   Interventions: 1:1 collaboration with Shelva Majestic, MD regarding development and update of comprehensive plan of care as evidenced by provider attestation and co-signature Inter-disciplinary care team collaboration (see longitudinal plan of care) Comprehensive medication review performed; medication list updated in electronic medical  record  Hypertension (BP goal <130/80) -Controlled, not assessed today -Current treatment: Amlodipine 10mg  daily Isosorbide 120mg  24hr daily Metoprolol Xl 25mg  daily -Medications previously tried: propronolol  -Current home readings: not checking -Current exercise habits: very active around the house, "always moving" no organized physical activity -Reports hypotensive/hypertensive symptoms - dizziness occasionally at random times -Educated on BP goals and benefits of medications for prevention of heart attack, stroke and kidney damage; Importance of home blood pressure monitoring; Symptoms of hypotension and importance of maintaining adequate hydration; -Counseled to monitor BP at home as able, document, and provide log at future appointments -Recommended to continue current medication Report dizziness to providers  Hyperlipidemia: (LDL goal < 70) -Controlled, not assessed today -Current treatment: Atorvastatin 40mg  Zetia 10mg  daily -Medications previously tried: Rosuvastatin (cramps)  -Educated on Cholesterol goals;  Benefits of statin for ASCVD risk reduction; Importance of limiting foods high in cholesterol; Most recent LDL is controlled -Recommended to continue current medication  Heart Failure (Goal: manage symptoms and prevent exacerbations) -Controlled, not assessed today -Last ejection fraction: 55-60%  -Current treatment: Furosemide 20mg  daily Metoprolol XL 25mg  daily Imdur 120mg  24hr daily -Medications previously tried: none noted  -Current home BP/HR readings: not checking, normal in office -Educated on Benefits  of medications for managing symptoms and prolonging life Importance of weighing daily; if you gain more than 3 pounds in one day or 5 pounds in one week, contact providers Proper diuretic administration and potassium supplementation -Recommended to continue current medication  GERD (Goal: Control symptoms) 05/04/22 -Not ideally controlled -Current  treatment  Esomeprazole 20mg  daily Appropriate, Query effective,  -Medications previously tried: none noted -Did switch to morning dosing, still having to take the gas pills.  Mentions her symptoms have improved somewhat. She never completed DEXA scan, would recommend due to long term PPI use and age. -Recommended to continue current medication, work on trigger foods for GERD.  Initial Visit Recommend she take medication in the am on empty stomach separate from other meds.  See if this helps symptoms.  Hyperthyroidism (Goal: Maintain TSH) 05/04/22 -Controlled -Current treatment  Methimazole 10mg  Appropriate, Query effective, -Medications previously tried: none noted -History of dosing is a little unclear.  Today, she tells me that she is taking two tablets twice daily for a total of 40mg  per day.  Based on the telephone note from 6/28, she was supposed to be taking one tablet twice daily for total of 20mg  daily.  TSH was very low at last labs. She does not return for follow up until September.  I discussed proper dosing with endocrine.  We are in agreement that 40mg  daily will be too much. Will update the patient on proper directions of 20mg  daily.  10mg  am and 10mg  pm.  Recheck TSH when she goes for FU visit. No other changes to medication at this time.    Anemia (Goal: Maintain Hgb) -Controlled, not assessed today -Current treatment  None noted -Medications previously tried: none noted  -Last Hgb is normal at 12.0 -Recommended to continue current medication  Patient Goals/Self-Care Activities Patient will:  - take medications as prescribed focus on medication adherence by pill counts check blood pressure periodically, document, and provide at future appointments  Follow Up Plan: The care management team will reach out to the patient again over the next 180 days.            The patient verbalized understanding of instructions, educational materials, and care plan provided  today and DECLINED offer to receive copy of patient instructions, educational materials, and care plan.  Telephone follow up appointment with pharmacy team member scheduled for: 6 months  , [facility]  , PharmD Clinical Pharmacist  [facility] [phone]

## 2022-05-06 ENCOUNTER — Other Ambulatory Visit: Payer: Self-pay | Admitting: Family Medicine

## 2022-05-12 ENCOUNTER — Ambulatory Visit (INDEPENDENT_AMBULATORY_CARE_PROVIDER_SITE_OTHER): Payer: Medicare Other

## 2022-05-12 DIAGNOSIS — Z0000 Encounter for general adult medical examination without abnormal findings: Secondary | ICD-10-CM

## 2022-05-12 DIAGNOSIS — Z1211 Encounter for screening for malignant neoplasm of colon: Secondary | ICD-10-CM

## 2022-05-12 NOTE — Progress Notes (Signed)
Virtual Visit via Telephone Note  I connected with  [name] on 05/12/22 at 10:00 AM EDT by telephone and verified that I am speaking with the correct person using two identifiers.  Medicare Annual Wellness visit completed telephonically due to Covid-19 pandemic.   Persons participating in this call: This Health Coach and this patient.   Location: Patient: home Provider: office   I discussed the limitations, risks, security and privacy concerns of performing an evaluation and management service by telephone and the availability of in person appointments. The patient expressed understanding and agreed to proceed.  Unable to perform video visit due to video visit attempted and failed and/or patient does not have video capability.   Some vital signs may be absent or patient reported.   Marzella Schlein, LPN   Subjective:   [name] is a 76 y.o. female who presents for Medicare Annual (Subsequent) preventive examination.  Review of Systems     Cardiac Risk Factors include: advanced age (>65men, >12 women);dyslipidemia;hypertension     Objective:    There were no vitals filed for this visit. There is no height or weight on file to calculate BMI.     05/12/2022   10:05 AM 02/06/2021    2:18 PM 02/03/2020    1:18 PM 02/03/2020   12:09 PM 01/09/2020    4:01 PM 08/05/2019   10:06 AM 12/01/2018    7:55 AM  Advanced Directives  Does Patient Have a Medical Advance Directive? No No No No No No No  Would patient like information on creating a medical advance directive? No - Patient declined Yes (MAU/Ambulatory/Procedural Areas - Information given)  No - Patient declined;Yes (ED - Information included in AVS) Yes (MAU/Ambulatory/Procedural Areas - Information given) Yes (ED - Information included in AVS) No - Patient declined    Current Medications (verified) Outpatient Encounter Medications as of 05/12/2022  Medication Sig   Aloe Vera 25 MG CAPS Take by mouth.   amLODipine  (NORVASC) 10 MG tablet TAKE 1 TABLET(10 MG) BY MOUTH DAILY   ASPIRIN LOW DOSE PO Take 81 mg by mouth daily.   atorvastatin (LIPITOR) 40 MG tablet TAKE 1 TABLET(40 MG) BY MOUTH DAILY   Cholecalciferol (VITAMIN D3) 25 MCG (1000 UT) CAPS Take by mouth.   Cobalamin Combinations (NEURIVA PLUS) CAPS Take by mouth.   Cyanocobalamin (VITAMIN B-12) 3000 MCG SUBL Place 3,000 mg under the tongue daily.   esomeprazole (NEXIUM) 20 MG capsule Take 1 capsule (20 mg total) by mouth daily.   ezetimibe (ZETIA) 10 MG tablet TAKE 1 TABLET(10 MG) BY MOUTH DAILY   furosemide (LASIX) 20 MG tablet Take 1 tablet (20 mg total) by mouth daily.   isosorbide mononitrate (IMDUR) 120 MG 24 hr tablet Take 1 tablet (120 mg total) by mouth daily.   methimazole (TAPAZOLE) 10 MG tablet Take 1 tablet (10 mg total) by mouth 2 (two) times daily.   metoprolol succinate (TOPROL-XL) 25 MG 24 hr tablet TAKE 1 TABLET(25 MG) BY MOUTH DAILY   nitroGLYCERIN (NITROSTAT) 0.4 MG SL tablet PLACE 1 TABLET UNDER THE TONGUE EVERY 5 MINUTES FOR CHEST PAIN FOR UPTO 3 DOSES   oxybutynin (DITROPAN-XL) 10 MG 24 hr tablet TAKE 1 TABLET BY MOUTH EVERY DAY   oxyCODONE-acetaminophen (PERCOCET) 10-325 MG tablet as needed. Taking as needed   potassium chloride (MICRO-K) 10 MEQ CR capsule Take 1 capsule (10 mEq total) by mouth 2 (two) times daily.   venlafaxine XR (EFFEXOR-XR) 150 MG 24 hr capsule TAKE 1  CAPSULE BY MOUTH AT BEDTIME.   vitamin C (VITAMIN C) 500 MG tablet Take 1 tablet (500 mg total) by mouth 2 (two) times daily.   Vitamin E 180 MG CAPS Take 2 capsules by mouth daily.   zinc sulfate 220 (50 Zn) MG capsule Take 1 capsule (220 mg total) by mouth daily.   Prenatal Vit-Fe Fumarate-FA (PRENATAL MULTIVITAMIN) TABS tablet Take 1 tablet by mouth daily at 12 noon. (Patient not taking: Reported on 05/12/2022)   [DISCONTINUED] traMADol (ULTRAM) 50 MG tablet TAKE 1 TABLET BY MOUTH EVERY 8 HOURS AS NEEDED FOR MODERATE OR SEVERE PAIN   No  facility-administered encounter medications on file as of 05/12/2022.    Allergies (verified) Crestor [rosuvastatin] and Sulfa drugs cross reactors   History: Past Medical History:  Diagnosis Date   Anemia 12/22/2017   Arthritis    Chronic lower back pain    Cluster headache    Coronary artery disease    a. 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 non obs dz LHC 2007. c. Non obs dz (mild-mod) by [facility] 05/17/14 d. cath 05/26/17 mild non obstructive CAD   COVID-19 08/07/2019   Diverticulitis    a. Hx microperf 2012 - hospitalizated.   GERD (gastroesophageal reflux disease)    Hypercholesteremia    Hypertension    Overactive bladder 09/19/2014   Oxybutynin 5mg  XL--> 10mg    PVC's (premature ventricular contractions)    a. Hx of trigeminy/bigeminy.   Seizures ([place])    Thyroid disease    hyperthyroid   UTI (lower urinary tract infection)    Past Surgical History:  Procedure Laterality Date   ABDOMINAL HYSTERECTOMY  1987   "partial"-still has ovaries   CARDIAC CATHETERIZATION  05/17/2014   CORONARY ANGIOPLASTY WITH STENT PLACEMENT  1995   "1"   JOINT REPLACEMENT     right knee   LEFT HEART CATH AND CORONARY ANGIOGRAPHY N/A 05/26/2017   Procedure: Left Heart Cath and Coronary Angiography;  Surgeon: Jettie Booze, MD;  Location: [facility];  Service: Cardiovascular;  Laterality: N/A;   LEFT HEART CATHETERIZATION WITH CORONARY ANGIOGRAM N/A 05/17/2014   Procedure: LEFT HEART CATHETERIZATION WITH CORONARY ANGIOGRAM;  Surgeon: Leonie Man, MD;  Location: [facility];  Service: Cardiovascular;  Laterality: N/A;   TOTAL KNEE ARTHROPLASTY Right 2005   TUBAL LIGATION  1970   VASCULAR SURGERY     Family History  Problem Relation Age of Onset   CAD Brother    Diabetes Brother    CAD Father    Diabetes Father    Diabetes Mother        father, sister, brothers   Diabetes Sister    Thyroid disease Sister    Heart attack Brother    Prostate cancer Brother    Thyroid disease Sister     Diabetes Sister    Social History   Socioeconomic History   Marital status: Married    Spouse name: Not on file   Number of children: 4   Years of education: 4   Highest education level: Not on file  Occupational History    Comment: retired  Tobacco Use   Smoking status: Never   Smokeless tobacco: Never  Vaping Use   Vaping Use: Never used  Substance and Sexual Activity   Alcohol use: No   Drug use: No   Sexual activity: Never  Other Topics Concern   Not on file  Social History Narrative   Separated. 4 children from first marriage. 16 grandkids.  Retired from [employer] for 25 years, went to [employer] for 5 years. Retired 2003 after MI      Hobbies: babysit/active with children      Right-handed      Caffeine: 24 oz soda per day      Social Determinants of Health   Financial Resource Strain: Low Risk  (05/12/2022)   Overall Financial Resource Strain (CARDIA)    Difficulty of Paying Living Expenses: Not hard at all  Food Insecurity: No Food Insecurity (05/12/2022)   Hunger Vital Sign    Worried About Running Out of Food in the Last Year: Never true    Ran Out of Food in the Last Year: Never true  Transportation Needs: No Transportation Needs (05/12/2022)   PRAPARE - Hydrologist (Medical): No    Lack of Transportation (Non-Medical): No  Physical Activity: Insufficiently Active (05/12/2022)   Exercise Vital Sign    Days of Exercise per Week: 3 days    Minutes of Exercise per Session: 10 min  Stress: No Stress Concern Present (05/12/2022)   [place]    Feeling of Stress : Not at all  Social Connections: Moderately Isolated (05/12/2022)   Social Connection and Isolation Panel [NHANES]    Frequency of Communication with Friends and Family: More than three times a week    Frequency of Social Gatherings with Friends and Family: Three times a week    Attends Religious Services:  More than 4 times per year    Active Member of Clubs or Organizations: No    Attends Archivist Meetings: Never    Marital Status: Widowed    Tobacco Counseling Counseling given: Not Answered   Clinical Intake:  Pre-visit preparation completed: Yes  Pain : No/denies pain     BMI - recorded: 24.45 Nutritional Status: BMI of 19-24  Normal Nutritional Risks: None Diabetes: No  How often do you need to have someone help you when you read instructions, pamphlets, or other written materials from your doctor or pharmacy?: 1 - Never  Diabetic?no  Interpreter Needed?: No  Information entered by :: Charlott Rakes, LPN   Activities of Daily Living    05/12/2022   10:08 AM  In your present state of health, do you have any difficulty performing the following activities:  Hearing? 0  Vision? 0  Difficulty concentrating or making decisions? 0  Walking or climbing stairs? 0  Dressing or bathing? 0  Doing errands, shopping? 0  Preparing Food and eating ? N  Using the Toilet? N  In the past six months, have you accidently leaked urine? Y  Comment wears a pad and brief sometime  Do you have problems with loss of bowel control? N  Managing your Medications? N  Managing your Finances? N  Housekeeping or managing your Housekeeping? N    Patient Care Team: Marin Olp, MD as PCP - General (Family Medicine) Belva Crome, MD as PCP - Cardiology (Cardiology) Edythe Clarity, [facility] (Pharmacist)  Indicate any recent Medical Services you may have received from other than [facility] providers in the past year (date may be approximate).     Assessment:   This is a routine wellness examination for Mitchell.  Hearing/Vision screen Hearing Screening - Comments:: Pt stated HOH  Vision Screening - Comments:: Pt follows up with provider for annual eye exams   Dietary issues and exercise activities discussed: Current Exercise Habits: Home exercise routine,  Type of exercise:  walking, Time (Minutes): 15, Frequency (Times/Week): 3, Weekly Exercise (Minutes/Week): 45   Goals Addressed             This Visit's Progress    Patient Stated       None at  this time        Depression Screen    05/12/2022   10:04 AM 02/06/2021    2:13 PM 11/29/2020    1:49 PM 02/14/2020   10:04 AM 01/09/2020    4:02 PM 01/09/2020    1:05 PM 10/13/2019    3:15 PM  PHQ 2/9 Scores  PHQ - 2 Score 1 0 0 0 2 2 0  PHQ- 9 Score    4 5 6      Fall Risk    05/12/2022   10:07 AM 02/06/2021    2:19 PM 11/29/2020    1:49 PM 01/09/2020    1:02 PM 07/06/2018    9:17 AM  Fall Risk   Falls in the past year? 0 0 0 0 No  Number falls in past yr: 0 0 0 0   Injury with Fall? 0 0 0 0   Risk for fall due to : Impaired vision;Impaired balance/gait Impaired vision     Follow up Falls prevention discussed Falls prevention discussed       FALL RISK PREVENTION PERTAINING TO THE HOME:  Any stairs in or around the home? Yes  If so, are there any without handrails? No  Home free of loose throw rugs in walkways, pet beds, electrical cords, etc? Yes  Adequate lighting in your home to reduce risk of falls? Yes   ASSISTIVE DEVICES UTILIZED TO PREVENT FALLS:  Life alert? No  Use of a cane, walker or w/c? No  Grab bars in the bathroom? No  Shower chair or bench in shower? No  Elevated toilet seat or a handicapped toilet? No   TIMED UP AND GO:  Was the test performed? No .   Cognitive Function:        05/12/2022   10:09 AM 02/06/2021    2:21 PM 01/09/2020    4:02 PM  6CIT Screen  What Year? 0 points 0 points 0 points  What month? 0 points 0 points 0 points  What time? 0 points  0 points  Count back from 20 0 points 2 points 0 points  Months in reverse 0 points 0 points 0 points  Repeat phrase 6 points 8 points 0 points  Total Score 6 points  0 points    Immunizations Immunization History  Administered Date(s) Administered   Influenza, High Dose Seasonal PF 08/17/2016, 08/06/2017    Influenza,inj,Quad PF,6+ Mos 10/11/2015   Pneumococcal Conjugate-13 10/11/2015   Pneumococcal Polysaccharide-23 11/19/2016   Td 12/25/1995    TDAP status: Due, Education has been provided regarding the importance of this vaccine. Advised may receive this vaccine at local pharmacy or Health Dept. Aware to provide a copy of the vaccination record if obtained from local pharmacy or Health Dept. Verbalized acceptance and understanding.  Flu Vaccine status: Due, Education has been provided regarding the importance of this vaccine. Advised may receive this vaccine at local pharmacy or Health Dept. Aware to provide a copy of the vaccination record if obtained from local pharmacy or Health Dept. Verbalized acceptance and understanding.  Pneumococcal vaccine status: Up to date  Covid-19 vaccine status: Information provided on how to obtain vaccines.   Qualifies for Shingles Vaccine? Yes   Zostavax completed  No   Shingrix Completed?: No.    Education has been provided regarding the importance of this vaccine. Patient has been advised to call insurance company to determine out of pocket expense if they have not yet received this vaccine. Advised may also receive vaccine at local pharmacy or Health Dept. Verbalized acceptance and understanding.  Screening Tests Health Maintenance  Topic Date Due   COVID-19 Vaccine (1) Never done   Zoster Vaccines- Shingrix (1 of 2) Never done   COLONOSCOPY (Pts 45-57yrs Insurance coverage will need to be confirmed)  Never done   TETANUS/TDAP  12/24/2005   DEXA SCAN  Never done   INFLUENZA VACCINE  05/26/2022   Pneumonia Vaccine 50+ Years old  Completed   Hepatitis C Screening  Completed   HPV VACCINES  Aged Out    Health Maintenance  Health Maintenance Due  Topic Date Due   COVID-19 Vaccine (1) Never done   Zoster Vaccines- Shingrix (1 of 2) Never done   COLONOSCOPY (Pts 45-29yrs Insurance coverage will need to be confirmed)  Never done   TETANUS/TDAP   12/24/2005   DEXA SCAN  Never done    Colorectal cancer screening: Referral to GI placed 05/12/22. Pt aware the office will call re: appt.  Mammogram status: Completed 02/22/20. Repeat every year  Bone Density status: Ordered 05/12/22. Pt provided with contact info and advised to call to schedule appt.   Additional Screening:  Hepatitis C Screening: Completed 04/06/16  Vision Screening: Recommended annual ophthalmology exams for early detection of glaucoma and other disorders of the eye. Is the patient up to date with their annual eye exam?  Yes  Who is the provider or what is the name of the office in which the patient attends annual eye exams? Unsure of name of provider  If pt is not established with a provider, would they like to be referred to a provider to establish care? No .   Dental Screening: Recommended annual dental exams for proper oral hygiene  Community Resource Referral / Chronic Care Management: CRR required this visit?  No   CCM required this visit?  No      Plan:     I have personally reviewed and noted the following in the patient's chart:   Medical and social history Use of alcohol, tobacco or illicit drugs  Current medications and supplements including opioid prescriptions.  Functional ability and status Nutritional status Physical activity Advanced directives List of other physicians Hospitalizations, surgeries, and ER visits in previous 12 months Vitals Screenings to include cognitive, depression, and falls Referrals and appointments  In addition, I have reviewed and discussed with patient certain preventive protocols, quality metrics, and best practice recommendations. A written personalized care plan for preventive services as well as general preventive health recommendations were provided to patient.     Willette Brace, LPN   [phone]   Nurse Notes: None

## 2022-05-12 NOTE — Progress Notes (Signed)
TSH order is in there active at [facility].  Her next visit with them is for September!  HC is my CMA.  Sorry about that!  Willa Frater, PharmD Clinical Pharmacist  [facility] [phone]

## 2022-05-12 NOTE — Patient Instructions (Signed)
[name] , Thank you for taking time to come for your Medicare Wellness Visit. I appreciate your ongoing commitment to your health goals. Please review the following plan we discussed and let me know if I can assist you in the future.   Screening recommendations/referrals: Colonoscopy: order placed 05/12/22 Mammogram: completed 02/22/20 due for appt  Bone Density: pt will follow up  Recommended yearly ophthalmology/optometry visit for glaucoma screening and checkup Recommended yearly dental visit for hygiene and checkup  Vaccinations: Influenza vaccine: due  Pneumococcal vaccine: Up to date Tdap vaccine: due and discussed  Shingles vaccine: Shingrix discussed. Please contact your pharmacy for coverage information.    Covid-19:pt stated she had one dose unsure of date   Advanced directives: Advance directive discussed with you today. Even though you declined this today please call our office should you change your mind and we can give you the proper paperwork for you to fill out.  Conditions/risks identified: none at this time   Next appointment: Follow up in one year for your annual wellness visit    Preventive Care 65 Years and Older, Female Preventive care refers to lifestyle choices and visits with your health care provider that can promote health and wellness. What does preventive care include? A yearly physical exam. This is also called an annual well check. Dental exams once or twice a year. Routine eye exams. Ask your health care provider how often you should have your eyes checked. Personal lifestyle choices, including: Daily care of your teeth and gums. Regular physical activity. Eating a healthy diet. Avoiding tobacco and drug use. Limiting alcohol use. Practicing safe sex. Taking low-dose aspirin every day. Taking vitamin and mineral supplements as recommended by your health care provider. What happens during an annual well check? The services and screenings done  by your health care provider during your annual well check will depend on your age, overall health, lifestyle risk factors, and family history of disease. Counseling  Your health care provider may ask you questions about your: Alcohol use. Tobacco use. Drug use. Emotional well-being. Home and relationship well-being. Sexual activity. Eating habits. History of falls. Memory and ability to understand (cognition). Work and work Astronomer. Reproductive health. Screening  You may have the following tests or measurements: Height, weight, and BMI. Blood pressure. Lipid and cholesterol levels. These may be checked every 5 years, or more frequently if you are over 72 years old. Skin check. Lung cancer screening. You may have this screening every year starting at age 24 if you have a 30-pack-year history of smoking and currently smoke or have quit within the past 15 years. Fecal occult blood test (FOBT) of the stool. You may have this test every year starting at age 67. Flexible sigmoidoscopy or colonoscopy. You may have a sigmoidoscopy every 5 years or a colonoscopy every 10 years starting at age 55. Hepatitis C blood test. Hepatitis B blood test. Sexually transmitted disease (STD) testing. Diabetes screening. This is done by checking your blood sugar (glucose) after you have not eaten for a while (fasting). You may have this done every 1-3 years. Bone density scan. This is done to screen for osteoporosis. You may have this done starting at age 29. Mammogram. This may be done every 1-2 years. Talk to your health care provider about how often you should have regular mammograms. Talk with your health care provider about your test results, treatment options, and if necessary, the need for more tests. Vaccines  Your health care provider may recommend  certain vaccines, such as: Influenza vaccine. This is recommended every year. Tetanus, diphtheria, and acellular pertussis (Tdap, Td) vaccine. You  may need a Td booster every 10 years. Zoster vaccine. You may need this after age 32. Pneumococcal 13-valent conjugate (PCV13) vaccine. One dose is recommended after age 85. Pneumococcal polysaccharide (PPSV23) vaccine. One dose is recommended after age 56. Talk to your health care provider about which screenings and vaccines you need and how often you need them. This information is not intended to replace advice given to you by your health care provider. Make sure you discuss any questions you have with your health care provider. Document Released: 11/08/2015 Document Revised: 07/01/2016 Document Reviewed: 08/13/2015 Elsevier Interactive Patient Education  2017 [employer] Prevention in the Home Falls can cause injuries. They can happen to people of all ages. There are many things you can do to make your home safe and to help prevent falls. What can I do on the outside of my home? Regularly fix the edges of walkways and driveways and fix any cracks. Remove anything that might make you trip as you walk through a door, such as a raised step or threshold. Trim any bushes or trees on the path to your home. Use bright outdoor lighting. Clear any walking paths of anything that might make someone trip, such as rocks or tools. Regularly check to see if handrails are loose or broken. Make sure that both sides of any steps have handrails. Any raised decks and porches should have guardrails on the edges. Have any leaves, snow, or ice cleared regularly. Use sand or salt on walking paths during winter. Clean up any spills in your garage right away. This includes oil or grease spills. What can I do in the bathroom? Use night lights. Install grab bars by the toilet and in the tub and shower. Do not use towel bars as grab bars. Use non-skid mats or decals in the tub or shower. If you need to sit down in the shower, use a plastic, non-slip stool. Keep the floor dry. Clean up any water that spills  on the floor as soon as it happens. Remove soap buildup in the tub or shower regularly. Attach bath mats securely with double-sided non-slip rug tape. Do not have throw rugs and other things on the floor that can make you trip. What can I do in the bedroom? Use night lights. Make sure that you have a light by your bed that is easy to reach. Do not use any sheets or blankets that are too big for your bed. They should not hang down onto the floor. Have a firm chair that has side arms. You can use this for support while you get dressed. Do not have throw rugs and other things on the floor that can make you trip. What can I do in the kitchen? Clean up any spills right away. Avoid walking on wet floors. Keep items that you use a lot in easy-to-reach places. If you need to reach something above you, use a strong step stool that has a grab bar. Keep electrical cords out of the way. Do not use floor polish or wax that makes floors slippery. If you must use wax, use non-skid floor wax. Do not have throw rugs and other things on the floor that can make you trip. What can I do with my stairs? Do not leave any items on the stairs. Make sure that there are handrails on both sides of the  stairs and use them. Fix handrails that are broken or loose. Make sure that handrails are as long as the stairways. Check any carpeting to make sure that it is firmly attached to the stairs. Fix any carpet that is loose or worn. Avoid having throw rugs at the top or bottom of the stairs. If you do have throw rugs, attach them to the floor with carpet tape. Make sure that you have a light switch at the top of the stairs and the bottom of the stairs. If you do not have them, ask someone to add them for you. What else can I do to help prevent falls? Wear shoes that: Do not have high heels. Have rubber bottoms. Are comfortable and fit you well. Are closed at the toe. Do not wear sandals. If you use a stepladder: Make  sure that it is fully opened. Do not climb a closed stepladder. Make sure that both sides of the stepladder are locked into place. Ask someone to hold it for you, if possible. Clearly mark and make sure that you can see: Any grab bars or handrails. First and last steps. Where the edge of each step is. Use tools that help you move around (mobility aids) if they are needed. These include: Canes. Walkers. Scooters. Crutches. Turn on the lights when you go into a dark area. Replace any light bulbs as soon as they burn out. Set up your furniture so you have a clear path. Avoid moving your furniture around. If any of your floors are uneven, fix them. If there are any pets around you, be aware of where they are. Review your medicines with your doctor. Some medicines can make you feel dizzy. This can increase your chance of falling. Ask your doctor what other things that you can do to help prevent falls. This information is not intended to replace advice given to you by your health care provider. Make sure you discuss any questions you have with your health care provider. Document Released: 08/08/2009 Document Revised: 03/19/2016 Document Reviewed: 11/16/2014 Elsevier Interactive Patient Education  2017 [employer].

## 2022-06-06 ENCOUNTER — Emergency Department (HOSPITAL_COMMUNITY): Payer: Medicare Other

## 2022-06-06 ENCOUNTER — Other Ambulatory Visit: Payer: Self-pay

## 2022-06-06 ENCOUNTER — Emergency Department (HOSPITAL_COMMUNITY)
Admission: EM | Admit: 2022-06-06 | Discharge: 2022-06-07 | Disposition: A | Discharge status: Home / Self Care | Payer: Medicare Other | Attending: Emergency Medicine | Admitting: Emergency Medicine

## 2022-06-06 DIAGNOSIS — R079 Chest pain, unspecified: Secondary | ICD-10-CM | POA: Diagnosis not present

## 2022-06-06 DIAGNOSIS — I10 Essential (primary) hypertension: Secondary | ICD-10-CM | POA: Diagnosis not present

## 2022-06-06 DIAGNOSIS — I2510 Atherosclerotic heart disease of native coronary artery without angina pectoris: Secondary | ICD-10-CM | POA: Diagnosis not present

## 2022-06-06 DIAGNOSIS — R0602 Shortness of breath: Secondary | ICD-10-CM | POA: Diagnosis not present

## 2022-06-06 DIAGNOSIS — M791 Myalgia, unspecified site: Secondary | ICD-10-CM

## 2022-06-06 DIAGNOSIS — E876 Hypokalemia: Secondary | ICD-10-CM | POA: Insufficient documentation

## 2022-06-06 DIAGNOSIS — R42 Dizziness and giddiness: Secondary | ICD-10-CM | POA: Diagnosis not present

## 2022-06-06 DIAGNOSIS — U071 COVID-19: Secondary | ICD-10-CM

## 2022-06-06 DIAGNOSIS — Z7982 Long term (current) use of aspirin: Secondary | ICD-10-CM | POA: Diagnosis not present

## 2022-06-06 DIAGNOSIS — R519 Headache, unspecified: Secondary | ICD-10-CM | POA: Diagnosis not present

## 2022-06-06 DIAGNOSIS — Z79899 Other long term (current) drug therapy: Secondary | ICD-10-CM | POA: Diagnosis not present

## 2022-06-06 LAB — CBC WITH DIFFERENTIAL/PLATELET
Abs Immature Granulocytes: 0.01 10*3/uL (ref 0.00–0.07)
Basophils Absolute: 0 10*3/uL (ref 0.0–0.1)
Basophils Relative: 0 %
Eosinophils Absolute: 0.1 10*3/uL (ref 0.0–0.5)
Eosinophils Relative: 1 %
HCT: 39.1 % (ref 36.0–46.0)
Hemoglobin: 13.1 g/dL (ref 12.0–15.0)
Immature Granulocytes: 0 %
Lymphocytes Relative: 9 %
Lymphs Abs: 0.5 10*3/uL — ABNORMAL LOW (ref 0.7–4.0)
MCH: 31.9 pg (ref 26.0–34.0)
MCHC: 33.5 g/dL (ref 30.0–36.0)
MCV: 95.1 fL (ref 80.0–100.0)
Monocytes Absolute: 0.3 10*3/uL (ref 0.1–1.0)
Monocytes Relative: 6 %
Neutro Abs: 4.8 10*3/uL (ref 1.7–7.7)
Neutrophils Relative %: 84 %
Platelets: 195 10*3/uL (ref 150–400)
RBC: 4.11 MIL/uL (ref 3.87–5.11)
RDW: 13.9 % (ref 11.5–15.5)
WBC: 5.7 10*3/uL (ref 4.0–10.5)
nRBC: 0 % (ref 0.0–0.2)

## 2022-06-06 LAB — BRAIN NATRIURETIC PEPTIDE: B Natriuretic Peptide: 247.5 pg/mL — ABNORMAL HIGH (ref 0.0–100.0)

## 2022-06-06 LAB — COMPREHENSIVE METABOLIC PANEL
ALT: 24 U/L (ref 0–44)
AST: 29 U/L (ref 15–41)
Albumin: 4.1 g/dL (ref 3.5–5.0)
Alkaline Phosphatase: 72 U/L (ref 38–126)
Anion gap: 9 (ref 5–15)
BUN: 8 mg/dL (ref 8–23)
CO2: 22 mmol/L (ref 22–32)
Calcium: 9.4 mg/dL (ref 8.9–10.3)
Chloride: 107 mmol/L (ref 98–111)
Creatinine, Ser: 0.99 mg/dL (ref 0.44–1.00)
GFR, Estimated: 59 mL/min — ABNORMAL LOW (ref >60)
Glucose, Bld: 109 mg/dL — ABNORMAL HIGH (ref 70–99)
Potassium: 3.3 mmol/L — ABNORMAL LOW (ref 3.5–5.1)
Sodium: 138 mmol/L (ref 135–145)
Total Bilirubin: 0.6 mg/dL (ref 0.3–1.2)
Total Protein: 7.5 g/dL (ref 6.5–8.1)

## 2022-06-06 LAB — URINALYSIS, ROUTINE W REFLEX MICROSCOPIC
Bilirubin Urine: NEGATIVE
Glucose, UA: NEGATIVE mg/dL
Hgb urine dipstick: NEGATIVE
Ketones, ur: NEGATIVE mg/dL
Leukocytes,Ua: NEGATIVE
Nitrite: POSITIVE — AB
Protein, ur: 30 mg/dL — AB
Specific Gravity, Urine: 1.021 (ref 1.005–1.030)
pH: 5 (ref 5.0–8.0)

## 2022-06-06 LAB — LACTIC ACID, PLASMA: Lactic Acid, Venous: 0.9 mmol/L (ref 0.5–1.9)

## 2022-06-06 LAB — TROPONIN I (HIGH SENSITIVITY)
Troponin I (High Sensitivity): 13 ng/L (ref <18)
Troponin I (High Sensitivity): 14 ng/L (ref <18)

## 2022-06-06 MED ORDER — ACETAMINOPHEN 325 MG PO TABS
650.0000 mg | ORAL_TABLET | Freq: Once | ORAL | Status: AC | PRN
  Administered 2022-06-06: 650 mg via ORAL
  Filled 2022-06-06: qty 2

## 2022-06-06 NOTE — ED Triage Notes (Signed)
Patient here POV accompanied by family stating she has felt bad all week, but complaining of a headache, dizzy, and feels like her "chest feels funny". Patient describes her headache all over and feels like she has a pressure cap on her head. Patient states she gets so dizzy she feels like she is going to fall- denies actually falling. Pt also endorses a cough that has increased in severity over the last 2 days. AOX4.

## 2022-06-06 NOTE — ED Provider Triage Note (Signed)
Emergency Medicine Provider Triage Evaluation Note  [name] , a 76 y.o. female  was evaluated in triage.  Pt complains of increasing dizziness, some shortness of breath, and increasing weakness.  Symptoms have been worsening all week.  Describes her chest is "feeling funny".  Now with headache as well, denies nausea and vomiting and diarrhea.  With some constipation, although states this is chronic.  Presyncopal sensations, although denies syncopal events.  Also decreased appetite.  AAOx4.  Hx of CHF, PVCs, diverticulitis, CAD, hypercholesterolemia, HTN, GERD, seizures, anemia, thyroid disease.  Review of Systems  Positive:  Negative: See above  Physical Exam  BP (!) 156/87 (BP Location: Right Arm)   Pulse (!) 103   Temp (!) 103 F (39.4 C) (Oral)   Resp 20   SpO2 97%  Gen:   Awake, no distress   Resp:  Mild increased respiratory effort, MSK:   Moves extremities with some difficulty, appears clinically weak Other:  Irregular rhythm, tachycardic.  No obvious murmurs.  Chest non-TTP.  Suprapubic tenderness.  Several coughs appreciated on exam.  Medical Decision Making  Medically screening exam initiated at 6:36 PM.  Appropriate orders placed.  [name] was informed that the remainder of the evaluation will be completed by another provider, this initial triage assessment does not replace that evaluation, and the importance of remaining in the ED until their evaluation is complete.  Patient meets criteria for sepsis, code sepsis initiated.   Cecil Cobbs, PA-C 06/06/22 1841

## 2022-06-06 NOTE — ED Provider Notes (Signed)
[facility] EMERGENCY DEPARTMENT Provider Note   CSN: [id number] Arrival date & time: 06/06/22  1801     History  Chief Complaint  Patient presents with   Headache   Dizziness    [name] is a 76 y.o. female PMH HTN, seizures, CAD who is presenting with headache and dizziness.  Grandchildren at bedside, contributed to history.  They report that patient has been feeling somewhat unwell for the last week, however symptoms worsened 1 to 2 days ago.  Patient notes sore throat, headache, dizziness, myalgias.  Did not check her temperature at home, is unsure if she has had a fever prior to arrival.  Denies any chest pain, though does report her chest "feeling funny".  She says that she has been having some mild shortness of breath with exertion, which is new for her.  Denies any nausea, vomiting, abdominal pain, diarrhea, dysuria, hematuria.  Reports decreased p.o. intake.    Home Medications Prior to Admission medications   Medication Sig Start Date End Date Taking? Authorizing Provider  Aloe Vera 25 MG CAPS Take by mouth.    [provider]  amLODipine (NORVASC) 10 MG tablet TAKE 1 TABLET(10 MG) BY MOUTH DAILY 01/06/22   Jari Favre N, PA-C  ASPIRIN LOW DOSE PO Take 81 mg by mouth daily.    [provider]  atorvastatin (LIPITOR) 40 MG tablet TAKE 1 TABLET(40 MG) BY MOUTH DAILY 01/06/22   Sharlene Dory, PA-C  Cholecalciferol (VITAMIN D3) 25 MCG (1000 UT) CAPS Take by mouth.    [provider]  Cobalamin Combinations (NEURIVA PLUS) CAPS Take by mouth.    [provider]  Cyanocobalamin (VITAMIN B-12) 3000 MCG SUBL Place 3,000 mg under the tongue daily.    [provider]  esomeprazole (NEXIUM) 20 MG capsule Take 1 capsule (20 mg total) by mouth daily. 04/03/21   Shelva Majestic, MD  ezetimibe (ZETIA) 10 MG tablet TAKE 1 TABLET(10 MG) BY MOUTH DAILY 01/06/22   Sharlene Dory, PA-C  furosemide (LASIX) 20 MG tablet Take 1 tablet  (20 mg total) by mouth daily. 01/06/22   Sharlene Dory, PA-C  isosorbide mononitrate (IMDUR) 120 MG 24 hr tablet Take 1 tablet (120 mg total) by mouth daily. 01/06/22   Sharlene Dory, PA-C  methimazole (TAPAZOLE) 10 MG tablet Take 1 tablet (10 mg total) by mouth 2 (two) times daily. 04/22/22   Shamleffer, Konrad Dolores, MD  metoprolol succinate (TOPROL-XL) 25 MG 24 hr tablet TAKE 1 TABLET(25 MG) BY MOUTH DAILY 01/06/22   Sharlene Dory, PA-C  nitroGLYCERIN (NITROSTAT) 0.4 MG SL tablet PLACE 1 TABLET UNDER THE TONGUE EVERY 5 MINUTES FOR CHEST PAIN FOR UPTO 3 DOSES 01/06/22   Sharlene Dory, PA-C  oxybutynin (DITROPAN-XL) 10 MG 24 hr tablet TAKE 1 TABLET BY MOUTH EVERY DAY 05/07/22   Shelva Majestic, MD  oxyCODONE-acetaminophen (PERCOCET) 10-325 MG tablet as needed. Taking as needed 12/19/21   [provider]  potassium chloride (MICRO-K) 10 MEQ CR capsule Take 1 capsule (10 mEq total) by mouth 2 (two) times daily. 01/06/22   Sharlene Dory, PA-C  Prenatal Vit-Fe Fumarate-FA (PRENATAL MULTIVITAMIN) TABS tablet Take 1 tablet by mouth daily at 12 noon. Patient not taking: Reported on 05/12/2022    [provider]  venlafaxine XR (EFFEXOR-XR) 150 MG 24 hr capsule TAKE 1 CAPSULE BY MOUTH AT BEDTIME. 05/07/22   Shelva Majestic, MD  vitamin C (VITAMIN C) 500 MG tablet  Take 1 tablet (500 mg total) by mouth 2 (two) times daily. 08/08/19   Burnadette Pop, MD  Vitamin E 180 MG CAPS Take 2 capsules by mouth daily.    [provider]  zinc sulfate 220 (50 Zn) MG capsule Take 1 capsule (220 mg total) by mouth daily. 08/09/19   Burnadette Pop, MD      Allergies    Crestor [rosuvastatin] and Sulfa drugs cross reactors    Review of Systems   Review of Systems  Neurological:  Positive for dizziness and headaches.    Physical Exam Updated Vital Signs BP (!) 145/86 (BP Location: Right Arm)   Pulse 92   Temp (!) 103 F (39.4 C) (Oral)   Resp 18   SpO2 96%  Physical Exam Vitals  and nursing note reviewed.  Constitutional:      General: She is not in acute distress.    Appearance: Normal appearance. She is well-developed. She is not ill-appearing or diaphoretic.  HENT:     Head: Normocephalic and atraumatic.     Right Ear: External ear normal.     Left Ear: External ear normal.     Nose: Nose normal.     Mouth/Throat:     Mouth: Mucous membranes are moist.  Cardiovascular:     Rate and Rhythm: Normal rate and regular rhythm.     Heart sounds: No murmur heard. Pulmonary:     Effort: Pulmonary effort is normal. No respiratory distress.     Breath sounds: Normal breath sounds. No wheezing.  Abdominal:     General: There is no distension.     Palpations: Abdomen is soft.     Tenderness: There is no abdominal tenderness. There is no guarding.  Musculoskeletal:        General: No swelling.     Cervical back: Neck supple.     Right lower leg: No edema.     Left lower leg: No edema.  Skin:    General: Skin is warm and dry.  Neurological:     Mental Status: She is alert and oriented to person, place, and time.     GCS: GCS eye subscore is 4. GCS verbal subscore is 5. GCS motor subscore is 6.     ED Results / Procedures / Treatments   Labs (all labs ordered are listed, but only abnormal results are displayed) Labs Reviewed  COMPREHENSIVE METABOLIC PANEL - Abnormal; Notable for the following components:      Result Value   Potassium 3.3 (*)    Glucose, Bld 109 (*)    GFR, Estimated 59 (*)    All other components within normal limits  URINALYSIS, ROUTINE W REFLEX MICROSCOPIC - Abnormal; Notable for the following components:   APPearance HAZY (*)    Protein, ur 30 (*)    Nitrite POSITIVE (*)    Bacteria, UA MANY (*)    All other components within normal limits  CBC WITH DIFFERENTIAL/PLATELET - Abnormal; Notable for the following components:   Lymphs Abs 0.5 (*)    All other components within normal limits  SARS CORONAVIRUS 2 BY RT PCR  CULTURE, BLOOD  (ROUTINE X 2)  CULTURE, BLOOD (ROUTINE X 2)  URINE CULTURE  LACTIC ACID, PLASMA  LACTIC ACID, PLASMA  BRAIN NATRIURETIC PEPTIDE  PROTIME-INR  APTT  TROPONIN I (HIGH SENSITIVITY)  TROPONIN I (HIGH SENSITIVITY)    EKG None  Radiology CT Head Wo Contrast  Result Date: 06/06/2022 CLINICAL DATA:  Dizziness, headaches EXAM: CT  HEAD WITHOUT CONTRAST TECHNIQUE: Contiguous axial images were obtained from the base of the skull through the vertex without intravenous contrast. RADIATION DOSE REDUCTION: This exam was performed according to the departmental dose-optimization program which includes automated exposure control, adjustment of the mA and/or kV according to patient size and/or use of iterative reconstruction technique. COMPARISON:  01/02/2019 FINDINGS: Brain: No acute intracranial findings are seen. There are no signs of bleeding within the cranium. There is no focal mass effect. Ventricles are not dilated. Cortical sulci are prominent. Vascular: There are scattered vascular calcifications. Skull: No recent fracture is seen. 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 Sinuses/Orbits: Unremarkable. Other: None. IMPRESSION: No acute intracranial findings are seen in noncontrast CT brain. Electronically Signed   By: Ernie Avena M.D.   On: 06/06/2022 19:26   DG Chest 2 View  Result Date: 06/06/2022 CLINICAL DATA:  Chest pain EXAM: CHEST - 2 VIEW COMPARISON:  03/30/2021 FINDINGS: Cardiac size is within normal limits. Thoracic aorta is tortuous. Increase in AP diameter of chest suggests COPD. Lung fields are clear of any infiltrates or pulmonary edema. There is no pleural effusion or pneumothorax. Postsurgical changes are noted in right humerus. IMPRESSION: No active cardiopulmonary disease. Electronically Signed   By: Ernie Avena M.D.   On: 06/06/2022 19:23    Procedures Procedures   Medications Ordered in ED Medications  acetaminophen  (TYLENOL) tablet 650 mg (650 mg Oral Given 06/06/22 1831)    ED Course/ Medical Decision Making/ A&P                           Medical Decision Making Amount and/or Complexity of Data Reviewed Radiology: ordered.  Risk OTC drugs.   Kylin Genna is a 76 y.o. female PMH HTN, seizures, CAD who is presenting with multiple complaints including headache, dizziness, sore throat, and myalgias.  Vitals at presentation notable for fever, 103 F. Patient is hemodynamically stable, satting well on room air.  Physical exam reassuring.  Normal heart sounds.  Lungs clear to auscultation bilaterally.  Abdomen is soft, nontender to palpation.  No pitting edema.  Initial differential includes but is not limited to: URI including influenza or COVID, pneumonia, gastritis, UTI  Lab work obtained, resulted notable for CBC without leukocytosis or anemia.  CMP with mild hypokalemia 3.3.  No evidence of AKI.  No elevated LFTs or anion gap.  Urine culture demonstrates positive nitrites, many bacteria, but no leukocytes.  Patient denies any dysuria, hematuria.  Troponin negative x2.  Lactic acid 0.9.  BNP mildly elevated to 247.5, but has been higher in the past.  Patient does not seem volume overloaded on exam.  Chest x-ray was pursued, did not show any evidence of acute cardiopulmonary abnormality per radiology read.  CT head obtained by triage given patient reporting headache, did not show any evidence of acute intracranial abnormality per radiology.  At the time of signout, am awaiting COVID swab results.  Patient will likely be discharged home regardless, however unclear source of fever at this time, as UA may be consistent with infectious etiology, however patient asymptomatic, and more likely consistent with asymptomatic bacteriuria.  EKG obtained, demonstrates sinus rhythm, ventricular rate 102 bpm.  No evidence of abnormal intervals or acute ischemia.  Interpreted by myself and my attending.  Interventions  include PO tylenol  ***The patient is safe and stable for discharge at this time with return precautions provided and a  plan for follow-up care in place as needed***  ***We will admit the patient to *** for ***  The plan for this patient was discussed with Dr. Denton Lank, who voiced agreement and who oversaw evaluation and treatment of this patient.    Final Clinical Impression(s) / ED Diagnoses Final diagnoses:  Myalgia    Rx / DC Orders ED Discharge Orders     None

## 2022-06-07 ENCOUNTER — Encounter (HOSPITAL_COMMUNITY): Payer: Self-pay

## 2022-06-07 LAB — SARS CORONAVIRUS 2 BY RT PCR: SARS Coronavirus 2 by RT PCR: POSITIVE — AB

## 2022-06-07 MED ORDER — IBUPROFEN 400 MG PO TABS: 400.0000 mg | ORAL_TABLET | Freq: Four times a day (QID) | ORAL | 0 refills | Status: AC

## 2022-06-07 MED ORDER — ACETAMINOPHEN 325 MG PO TABS: 650.0000 mg | ORAL_TABLET | Freq: Four times a day (QID) | ORAL | 0 refills | Status: DC

## 2022-06-07 MED ORDER — IBUPROFEN 200 MG PO TABS
600.0000 mg | ORAL_TABLET | Freq: Once | ORAL | Status: AC
  Administered 2022-06-07: 600 mg via ORAL
  Filled 2022-06-07: qty 1

## 2022-06-07 NOTE — ED Provider Notes (Signed)
1:21 AM Assumed care from Dr. Arletha Grippe and Denton Lank, please see their note for full history, physical and decision making until this point. In brief this is a 76 y.o. year old female who presented to the ED tonight with Headache and Dizziness     Pending COVID test if positive - likely source of symptoms, if negative, treat urine. Stable for discharge.   COVID positive. Patient appears well. Advised alternating tylenol/ibuprofen for the fever and symptoms. Sounds like she has been feeling unwell for quite a few days, not a candidate for paxlovid. Will have PCP follow up in a few days to ensure improvement.  Discharge instructions, including strict return precautions for new or worsening symptoms, given. Patient and/or family verbalized understanding and agreement with the plan as described.   Labs, studies and imaging reviewed by myself and considered in medical decision making if ordered. Imaging interpreted by radiology.  Labs Reviewed  SARS CORONAVIRUS 2 BY RT PCR - Abnormal; Notable for the following components:      Result Value   SARS Coronavirus 2 by RT PCR POSITIVE (*)    All other components within normal limits  COMPREHENSIVE METABOLIC PANEL - Abnormal; Notable for the following components:   Potassium 3.3 (*)    Glucose, Bld 109 (*)    GFR, Estimated 59 (*)    All other components within normal limits  URINALYSIS, ROUTINE W REFLEX MICROSCOPIC - Abnormal; Notable for the following components:   APPearance HAZY (*)    Protein, ur 30 (*)    Nitrite POSITIVE (*)    Bacteria, UA MANY (*)    All other components within normal limits  BRAIN NATRIURETIC PEPTIDE - Abnormal; Notable for the following components:   B Natriuretic Peptide 247.5 (*)    All other components within normal limits  CBC WITH DIFFERENTIAL/PLATELET - Abnormal; Notable for the following components:   Lymphs Abs 0.5 (*)    All other components within normal limits  CULTURE, BLOOD (ROUTINE X 2)  CULTURE, BLOOD  (ROUTINE X 2)  URINE CULTURE  LACTIC ACID, PLASMA  TROPONIN I (HIGH SENSITIVITY)  TROPONIN I (HIGH SENSITIVITY)    CT Head Wo Contrast  Final Result    DG Chest 2 View  Final Result      No follow-ups on file.    Marily Memos, MD 06/07/22 [phone]

## 2022-06-07 NOTE — ED Notes (Signed)
Per pt she came in for a little bit of everything. Top 5 are c/o headache and wouldn't stop, swimming,staggering around dizzy. 2 chest pain feeling funny on rt, lt and center. Felt like her heart and lungs were just floating around. C/o nausea, diaphoretic, denies any radiation states stomach felt funny and weird, she couldn't do anything because she weak and unsteady on her feet.

## 2022-06-09 LAB — URINE CULTURE: Culture: >=100000 — AB

## 2022-06-10 ENCOUNTER — Telehealth: Payer: Self-pay | Admitting: *Deleted

## 2022-06-10 ENCOUNTER — Ambulatory Visit (INDEPENDENT_AMBULATORY_CARE_PROVIDER_SITE_OTHER): Payer: Medicare Other | Admitting: Family

## 2022-06-10 ENCOUNTER — Encounter: Payer: Self-pay | Admitting: Family

## 2022-06-10 VITALS — BP 102/54 | HR 64 | Temp 97.8°F | Ht 66.0 in | Wt 145.4 lb

## 2022-06-10 DIAGNOSIS — R053 Chronic cough: Secondary | ICD-10-CM

## 2022-06-10 MED ORDER — BENZONATATE 200 MG PO CAPS: 200.0000 mg | ORAL_CAPSULE | Freq: Two times a day (BID) | ORAL | 0 refills | Status: DC | PRN

## 2022-06-10 NOTE — Telephone Encounter (Signed)
Post ED Visit - Positive Culture Follow-up  Culture report reviewed by antimicrobial stewardship pharmacist: [facility] []  , Pharm.D. []  Enzo Bi, Pharm.D., BCPS AQ-ID []  , Pharm.D., BCPS []  Celedonio Miyamoto, Pharm.D., BCPS []  Norris, Garvin Fila.D., BCPS, AAHIVP []  , Pharm.D., BCPS, AAHIVP []  Georgina Pillion, PharmD, BCPS []  , PharmD, BCPS []  [place], PharmD, BCPS []  [address], PharmD []  , PharmD, BCPS []  Estella Husk, PharmD  Pharmacy Team []  Lysle Pearl, PharmD []  , PharmD []  Phillips Climes, PharmD []  , Rph []  Agapito Games) , PharmD []  Verlan Friends, PharmD []  , PharmD []  Mervyn Gay, PharmD []  , PharmD []  Vinnie Level, PharmD []  [facility], PharmD []  , PharmD []  Len Childs, PharmD   Positive urine culture No UTI symptoms and no further patient follow-up is required at this time. , MD  Greer Pickerel Talley 06/10/2022, 9:25 AM

## 2022-06-10 NOTE — Patient Instructions (Addendum)
It was very nice to see you today!   I have sent over a cough medicine, Tessalon Pearles, to your pharmacy.  Continue to drink 2L water every day and 1 or 2 Ensure/Boost drinks.   Schedule a follow up visit today with Dr Durene Cal.      PLEASE NOTE:  If you had any lab tests please let us know if you have not heard back within a few days. You may see your results on MyChart before we have a chance to review them but we will give you a call once they are reviewed by [place]. If we ordered any referrals today, please let us know if you have not heard from their office within the next week.

## 2022-06-10 NOTE — Progress Notes (Signed)
Patient ID: [name], female    DOB: 24-Sep-1946, 76 y.o.   MRN: [id number]  Chief Complaint  Patient presents with   Covid Positive    Pt tested positive on 8/12 at ED. She was not giving any medications. She is coughing still and been taking aspirin every 4-5 hours, scratchy throat. Pt states she has had body aches, fever and weakness.    HPI: Upper Respiratory Infection: Symptoms include achiness, low grade fever, non productive cough, and decreased appetitite .Marland Kitchen  Onset of symptoms was 7 day ago, gradually improving since that time. She is drinking moderate amounts of fluids. Evaluation to date: seen previously and thought to have a viral URI = positive for Covid19, did not receive antiviral as had sx for 3 days per ER notes.Treatment to date:  Tylenol .  Assessment & Plan:  1. Persistent cough sending Tessalon pearles, pt reports all other sx mostly resolved, advised on use & SE, continue to hydrate well qd, eating mini meals, drink Boost/Ensure in small amounts for decreased appetite.  - benzonatate (TESSALON) 200 MG capsule; Take 1 capsule (200 mg total) by mouth 2 (two) times daily as needed for cough.  Dispense: 20 capsule; Refill: 0  Subjective:    Outpatient Medications Prior to Visit  Medication Sig Dispense Refill   acetaminophen (TYLENOL) 325 MG tablet Take 2 tablets (650 mg total) by mouth 4 (four) times daily. 30 tablet 0   Aloe Vera 25 MG CAPS Take by mouth.     amLODipine (NORVASC) 10 MG tablet TAKE 1 TABLET(10 MG) BY MOUTH DAILY 90 tablet 3   ASPIRIN LOW DOSE PO Take 81 mg by mouth daily.     atorvastatin (LIPITOR) 40 MG tablet TAKE 1 TABLET(40 MG) BY MOUTH DAILY 90 tablet 3   Cholecalciferol (VITAMIN D3) 25 MCG (1000 UT) CAPS Take by mouth.     Cobalamin Combinations (NEURIVA PLUS) CAPS Take by mouth.     Cyanocobalamin (VITAMIN B-12) 3000 MCG SUBL Place 3,000 mg under the tongue daily.     esomeprazole (NEXIUM) 20 MG capsule Take 1 capsule (20 mg total) by mouth  daily. 90 capsule 3   ezetimibe (ZETIA) 10 MG tablet TAKE 1 TABLET(10 MG) BY MOUTH DAILY 90 tablet 3   furosemide (LASIX) 20 MG tablet Take 1 tablet (20 mg total) by mouth daily. 90 tablet 3   ibuprofen (ADVIL) 400 MG tablet Take 1 tablet (400 mg total) by mouth 4 (four) times daily for 5 days. 20 tablet 0   isosorbide mononitrate (IMDUR) 120 MG 24 hr tablet Take 1 tablet (120 mg total) by mouth daily. 90 tablet 3   methimazole (TAPAZOLE) 10 MG tablet Take 1 tablet (10 mg total) by mouth 2 (two) times daily. 180 tablet 1   metoprolol succinate (TOPROL-XL) 25 MG 24 hr tablet TAKE 1 TABLET(25 MG) BY MOUTH DAILY 90 tablet 3   nitroGLYCERIN (NITROSTAT) 0.4 MG SL tablet PLACE 1 TABLET UNDER THE TONGUE EVERY 5 MINUTES FOR CHEST PAIN FOR UPTO 3 DOSES 25 tablet 3   oxybutynin (DITROPAN-XL) 10 MG 24 hr tablet TAKE 1 TABLET BY MOUTH EVERY DAY 90 tablet 3   oxyCODONE-acetaminophen (PERCOCET) 10-325 MG tablet as needed. Taking as needed     potassium chloride (MICRO-K) 10 MEQ CR capsule Take 1 capsule (10 mEq total) by mouth 2 (two) times daily. 180 capsule 3   Prenatal Vit-Fe Fumarate-FA (PRENATAL MULTIVITAMIN) TABS tablet Take 1 tablet by mouth daily at 12 noon.  venlafaxine XR (EFFEXOR-XR) 150 MG 24 hr capsule TAKE 1 CAPSULE BY MOUTH AT BEDTIME. 30 capsule 5   vitamin C (VITAMIN C) 500 MG tablet Take 1 tablet (500 mg total) by mouth 2 (two) times daily. 14 tablet 0   Vitamin E 180 MG CAPS Take 2 capsules by mouth daily.     zinc sulfate 220 (50 Zn) MG capsule Take 1 capsule (220 mg total) by mouth daily. 7 capsule 0   No facility-administered medications prior to visit.   Past Medical History:  Diagnosis Date   Anemia 12/22/2017   Arthritis    Chronic lower back pain    Cluster headache    Coronary artery disease    a. 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 non obs dz LHC 2007. c. Non obs dz (mild-mod) by [facility] 05/17/14 d. cath 05/26/17 mild non obstructive CAD   COVID-19 08/07/2019   Diverticulitis    a. Hx  microperf 2012 - hospitalizated.   GERD (gastroesophageal reflux disease)    Hypercholesteremia    Hypertension    Overactive bladder 09/19/2014   Oxybutynin 5mg  XL--> 10mg    PVC's (premature ventricular contractions)    a. Hx of trigeminy/bigeminy.   Seizures (HCC)    Thyroid disease    hyperthyroid   UTI (lower urinary tract infection)    Past Surgical History:  Procedure Laterality Date   ABDOMINAL HYSTERECTOMY  1987   "partial"-still has ovaries   CARDIAC CATHETERIZATION  05/17/2014   CORONARY ANGIOPLASTY WITH STENT PLACEMENT  1995   "1"   JOINT REPLACEMENT     right knee   LEFT HEART CATH AND CORONARY ANGIOGRAPHY N/A 05/26/2017   Procedure: Left Heart Cath and Coronary Angiography;  Surgeon: 05/19/2014, MD;  Location: [facility] INVASIVE CV LAB;  Service: Cardiovascular;  Laterality: N/A;   LEFT HEART CATHETERIZATION WITH CORONARY ANGIOGRAM N/A 05/17/2014   Procedure: LEFT HEART CATHETERIZATION WITH CORONARY ANGIOGRAM;  Surgeon: [facility], MD;  Location: [facility] CATH LAB;  Service: Cardiovascular;  Laterality: N/A;   TOTAL KNEE ARTHROPLASTY Right 2005   TUBAL LIGATION  1970   VASCULAR SURGERY     Allergies  Allergen Reactions   Crestor [Rosuvastatin] Other (See Comments)    Hot flashes and severe cramps   Sulfa Drugs Cross Reactors Shortness Of Breath and Palpitations      Objective:    Physical Exam Vitals and nursing note reviewed.  Constitutional:      Appearance: Normal appearance.  Cardiovascular:     Rate and Rhythm: Normal rate and regular rhythm.  Pulmonary:     Effort: Pulmonary effort is normal.     Breath sounds: Normal breath sounds.  Musculoskeletal:        General: Normal range of motion.  Skin:    General: Skin is warm and dry.  Neurological:     Mental Status: She is alert.  Psychiatric:        Mood and Affect: Mood normal.        Behavior: Behavior normal.    BP (!) 102/54 (BP Location: Left Arm, Patient Position: Sitting, Cuff Size:  Large)   Pulse 64   Temp 97.8 F (36.6 C) (Temporal)   Ht 5\' 6"  (1.676 m)   Wt 145 lb 6.4 oz (66 kg)   SpO2 99%   BMI 23.47 kg/m  Wt Readings from Last 3 Encounters:  06/10/22 145 lb 6.4 oz (66 kg)  06/07/22 145 lb (65.8 kg)  04/22/22 151 lb 6.4 oz (68.7 kg)  Jeanie Sewer, NP

## 2022-06-10 NOTE — Progress Notes (Signed)
ED Antimicrobial Stewardship Positive Culture Follow Up   [name] is an 76 y.o. female who presented to [facility] on 06/06/2022 with a chief complaint of  Chief Complaint  Patient presents with   Headache   Dizziness    Recent Results (from the past 720 hour(s))  SARS Coronavirus 2 by RT PCR (hospital order, performed in [facility] hospital lab) *cepheid single result test* Anterior Nasal Swab     Status: Abnormal   Collection Time: 06/06/22  6:26 PM   Specimen: Anterior Nasal Swab  Result Value Ref Range Status   SARS Coronavirus 2 by RT PCR POSITIVE (A) NEGATIVE Final    Comment: Performed at [facility], [address] [place], [place] [address]  Blood Culture (routine x 2)     Status: None (Preliminary result)   Collection Time: 06/06/22  6:30 PM   Specimen: BLOOD  Result Value Ref Range Status   Specimen Description BLOOD LEFT ANTECUBITAL  Final   Special Requests   Final    BOTTLES DRAWN AEROBIC AND ANAEROBIC Blood Culture results may not be optimal due to an inadequate volume of blood received in culture bottles   Culture   Final    NO GROWTH 4 DAYS Performed at [facility], [address], [place], [place] [address]    Report Status PENDING  Incomplete  Urine Culture     Status: Abnormal   Collection Time: 06/06/22  6:30 PM   Specimen: In/Out Cath Urine  Result Value Ref Range Status   Specimen Description IN/OUT CATH URINE  Final   Special Requests   Final    NONE Performed at [facility], [address] [place], [place] [address]    Culture >=100,000 COLONIES/mL ESCHERICHIA COLI (A)  Final   Report Status 06/09/2022 FINAL  Final   Organism ID, Bacteria ESCHERICHIA COLI (A)  Final      Susceptibility   Escherichia coli - MIC*    AMPICILLIN >=32 RESISTANT Resistant     CEFAZOLIN <=4 SENSITIVE Sensitive     CEFEPIME <=0.12 SENSITIVE Sensitive     CEFTRIAXONE <=0.25 SENSITIVE Sensitive     CIPROFLOXACIN <=0.25 SENSITIVE Sensitive      GENTAMICIN <=1 SENSITIVE Sensitive     IMIPENEM <=0.25 SENSITIVE Sensitive     NITROFURANTOIN <=16 SENSITIVE Sensitive     TRIMETH/SULFA <=20 SENSITIVE Sensitive     AMPICILLIN/SULBACTAM >=32 RESISTANT Resistant     PIP/TAZO <=4 SENSITIVE Sensitive     * >=100,000 COLONIES/mL ESCHERICHIA COLI  Blood Culture (routine x 2)     Status: None (Preliminary result)   Collection Time: 06/06/22  8:58 PM   Specimen: BLOOD  Result Value Ref Range Status   Specimen Description BLOOD RIGHT ARM  Final   Special Requests   Final    BOTTLES DRAWN AEROBIC AND ANAEROBIC Blood Culture adequate volume   Culture   Final    NO GROWTH 4 DAYS Performed at [facility], [address] [place], [place] [address]    Report Status PENDING  Incomplete    []  Treated with N/A, organism resistant to prescribed antimicrobial [x]  Patient discharged originally without antimicrobial agent and treatment is now indicated  New antibiotic prescription: F/u with patient to see is she has any UTI symptoms. If symptomatic, start cephalexin Q6H x 5 days.  ED Provider: , MD   Julieta Rogalski, 06/10/2022, 8:24 AM Clinical Pharmacist Monday - Friday phone -  [phone]  Saturday - Sunday phone - [phone]

## 2022-06-11 LAB — CULTURE, BLOOD (ROUTINE X 2)
Culture: NO GROWTH
Culture: NO GROWTH
Special Requests: ADEQUATE

## 2022-06-17 ENCOUNTER — Ambulatory Visit: Payer: Medicare Other | Admitting: Family Medicine

## 2022-07-01 ENCOUNTER — Telehealth: Payer: Self-pay | Admitting: Pharmacist

## 2022-07-01 NOTE — Progress Notes (Signed)
Chronic Care Management Pharmacy Assistant   Name: [name]  MRN: [id number] DOB: [birth year]   Reason for Encounter: General Adherence Call    Recent office visits:  06/10/2022 OV (Fam Med) Katherine Sellar, NP; Rx Beckie Salts for persistent cough  Recent consult visits:  None  Hospital visits:  06/06/2022 ED visit for Myalgia, COVID -No medication changes noted.  Medications: Outpatient Encounter Medications as of 07/01/2022  Medication Sig   acetaminophen (TYLENOL) 325 MG tablet Take 2 tablets (650 mg total) by mouth 4 (four) times daily.   Aloe Vera 25 MG CAPS Take by mouth.   amLODipine (NORVASC) 10 MG tablet TAKE 1 TABLET(10 MG) BY MOUTH DAILY   ASPIRIN LOW DOSE PO Take 81 mg by mouth daily.   atorvastatin (LIPITOR) 40 MG tablet TAKE 1 TABLET(40 MG) BY MOUTH DAILY   benzonatate (TESSALON) 200 MG capsule Take 1 capsule (200 mg total) by mouth 2 (two) times daily as needed for cough.   Cholecalciferol (VITAMIN D3) 25 MCG (1000 UT) CAPS Take by mouth.   Cobalamin Combinations (NEURIVA PLUS) CAPS Take by mouth.   Cyanocobalamin (VITAMIN B-12) 3000 MCG SUBL Place 3,000 mg under the tongue daily.   esomeprazole (NEXIUM) 20 MG capsule Take 1 capsule (20 mg total) by mouth daily.   ezetimibe (ZETIA) 10 MG tablet TAKE 1 TABLET(10 MG) BY MOUTH DAILY   furosemide (LASIX) 20 MG tablet Take 1 tablet (20 mg total) by mouth daily.   isosorbide mononitrate (IMDUR) 120 MG 24 hr tablet Take 1 tablet (120 mg total) by mouth daily.   methimazole (TAPAZOLE) 10 MG tablet Take 1 tablet (10 mg total) by mouth 2 (two) times daily.   metoprolol succinate (TOPROL-XL) 25 MG 24 hr tablet TAKE 1 TABLET(25 MG) BY MOUTH DAILY   nitroGLYCERIN (NITROSTAT) 0.4 MG SL tablet PLACE 1 TABLET UNDER THE TONGUE EVERY 5 MINUTES FOR CHEST PAIN FOR UPTO 3 DOSES   oxybutynin (DITROPAN-XL) 10 MG 24 hr tablet TAKE 1 TABLET BY MOUTH EVERY DAY   oxyCODONE-acetaminophen (PERCOCET) 10-325 MG tablet as needed.  Taking as needed   potassium chloride (MICRO-K) 10 MEQ CR capsule Take 1 capsule (10 mEq total) by mouth 2 (two) times daily.   Prenatal Vit-Fe Fumarate-FA (PRENATAL MULTIVITAMIN) TABS tablet Take 1 tablet by mouth daily at 12 noon.   venlafaxine XR (EFFEXOR-XR) 150 MG 24 hr capsule TAKE 1 CAPSULE BY MOUTH AT BEDTIME.   vitamin C (VITAMIN C) 500 MG tablet Take 1 tablet (500 mg total) by mouth 2 (two) times daily.   Vitamin E 180 MG CAPS Take 2 capsules by mouth daily.   zinc sulfate 220 (50 Zn) MG capsule Take 1 capsule (220 mg total) by mouth daily.   No facility-administered encounter medications on file as of 07/01/2022.   Contacted [employer] for [employer] Review Call   Chart Review:  Have there been any documented new, changed, or discontinued medications since last visit? No Has there been any documented recent hospitalizations or ED visits since last visit with Clinical Pharmacist? No   Adherence Review:  Does the Clinical Pharmacist Assistant have access to adherence rates? Yes Adherence rates for STAR metric medications: Atorvastatin 40 mg last filled 06/21/2022 90 DS Does the patient have >5 day gap between last estimated fill dates for any of the above medications or other medication gaps? No Reason for medication gaps: None   Disease State Questions:  Able to connect with Patient? Yes Did patient have any problems with  their health recently? No Have you had any admissions or emergency room visits or worsening of your condition(s) since last visit? No Have you had any visits with new specialists or providers since your last visit? No Have you had any new health care problem(s) since your last visit? No Have you run out of any of your medications since you last spoke with clinical pharmacist? No Are there any medications you are not taking as prescribed? No Are you having any issues or side effects with your medications? No Do you have any other health concerns or  questions you want to discuss with your Clinical Pharmacist before your next visit? No Are there any health concerns that you feel we can do a better job addressing? No Are you having any problems with any of the following since the last visit: (select all that apply)  None 12. Any falls since last visit? No 13. Any increased or uncontrolled pain since last visit? No  -Patient states she has not had an updated TSH. However, she goes to see Dr. Durene Cal on 07/09/2022. She plans to have Dr. Durene Cal check her TSH.  Care Gaps: Medicare Annual Wellness: Completed 05/12/2022 Hemoglobin A1C: 6.6% on 07/10/2021 Colonoscopy: Overdue - never done Dexa Scan: Overdue - never done  Future Appointments  Date Time Provider Department Center  07/09/2022  1:00 PM Shelva Majestic, MD [facility]  08/26/2022  8:10 AM Shamleffer, Konrad Dolores, MD [facility] None  11/03/2022  2:00 PM [facility] PHARMACIST [facility]   Star Rating Drugs: Atorvastatin 40 mg last filled 06/21/2022 90 DS  April D Calhoun, [facility] Clinical Pharmacist Assistant [phone]

## 2022-07-09 ENCOUNTER — Ambulatory Visit (INDEPENDENT_AMBULATORY_CARE_PROVIDER_SITE_OTHER): Payer: Medicare Other | Admitting: Family Medicine

## 2022-07-09 ENCOUNTER — Encounter: Payer: Self-pay | Admitting: Family Medicine

## 2022-07-09 VITALS — BP 108/64 | HR 59 | Temp 98.0°F | Ht 66.0 in | Wt 148.4 lb

## 2022-07-09 DIAGNOSIS — E0590 Thyrotoxicosis, unspecified without thyrotoxic crisis or storm: Secondary | ICD-10-CM | POA: Diagnosis not present

## 2022-07-09 DIAGNOSIS — I25119 Atherosclerotic heart disease of native coronary artery with unspecified angina pectoris: Secondary | ICD-10-CM | POA: Diagnosis not present

## 2022-07-09 DIAGNOSIS — E7800 Pure hypercholesterolemia, unspecified: Secondary | ICD-10-CM

## 2022-07-09 DIAGNOSIS — I700 Atherosclerosis of aorta: Secondary | ICD-10-CM

## 2022-07-09 DIAGNOSIS — R300 Dysuria: Secondary | ICD-10-CM | POA: Diagnosis not present

## 2022-07-09 DIAGNOSIS — R569 Unspecified convulsions: Secondary | ICD-10-CM | POA: Diagnosis not present

## 2022-07-09 DIAGNOSIS — R739 Hyperglycemia, unspecified: Secondary | ICD-10-CM | POA: Diagnosis not present

## 2022-07-09 DIAGNOSIS — H9190 Unspecified hearing loss, unspecified ear: Secondary | ICD-10-CM

## 2022-07-09 DIAGNOSIS — Z780 Asymptomatic menopausal state: Secondary | ICD-10-CM | POA: Diagnosis not present

## 2022-07-09 DIAGNOSIS — Z0000 Encounter for general adult medical examination without abnormal findings: Secondary | ICD-10-CM | POA: Diagnosis not present

## 2022-07-09 LAB — CBC WITH DIFFERENTIAL/PLATELET
Basophils Absolute: 0 10*3/uL (ref 0.0–0.1)
Basophils Relative: 0.8 % (ref 0.0–3.0)
Eosinophils Absolute: 0.2 10*3/uL (ref 0.0–0.7)
Eosinophils Relative: 5 % (ref 0.0–5.0)
HCT: 35.7 % — ABNORMAL LOW (ref 36.0–46.0)
Hemoglobin: 12 g/dL (ref 12.0–15.0)
Lymphocytes Relative: 47.4 % — ABNORMAL HIGH (ref 12.0–46.0)
Lymphs Abs: 1.9 10*3/uL (ref 0.7–4.0)
MCHC: 33.6 g/dL (ref 30.0–36.0)
MCV: 94.9 fl (ref 78.0–100.0)
Monocytes Absolute: 0.4 10*3/uL (ref 0.1–1.0)
Monocytes Relative: 9.2 % (ref 3.0–12.0)
Neutro Abs: 1.5 10*3/uL (ref 1.4–7.7)
Neutrophils Relative %: 37.6 % — ABNORMAL LOW (ref 43.0–77.0)
Platelets: 182 10*3/uL (ref 150.0–400.0)
RBC: 3.76 Mil/uL — ABNORMAL LOW (ref 3.87–5.11)
RDW: 15.2 % (ref 11.5–15.5)
WBC: 4 10*3/uL (ref 4.0–10.5)

## 2022-07-09 LAB — COMPREHENSIVE METABOLIC PANEL
ALT: 17 U/L (ref 0–35)
AST: 20 U/L (ref 0–37)
Albumin: 4 g/dL (ref 3.5–5.2)
Alkaline Phosphatase: 75 U/L (ref 39–117)
BUN: 13 mg/dL (ref 6–23)
CO2: 28 mEq/L (ref 19–32)
Calcium: 9.5 mg/dL (ref 8.4–10.5)
Chloride: 103 mEq/L (ref 96–112)
Creatinine, Ser: 1.15 mg/dL (ref 0.40–1.20)
GFR: 46.54 mL/min — ABNORMAL LOW (ref >60.00)
Glucose, Bld: 85 mg/dL (ref 70–99)
Potassium: 3.5 mEq/L (ref 3.5–5.1)
Sodium: 140 mEq/L (ref 135–145)
Total Bilirubin: 0.6 mg/dL (ref 0.2–1.2)
Total Protein: 7.6 g/dL (ref 6.0–8.3)

## 2022-07-09 LAB — URINALYSIS, ROUTINE W REFLEX MICROSCOPIC
Bilirubin Urine: NEGATIVE
Hgb urine dipstick: NEGATIVE
Leukocytes,Ua: NEGATIVE
Nitrite: NEGATIVE
RBC / HPF: NONE SEEN (ref 0–?)
Specific Gravity, Urine: 1.02 (ref 1.000–1.030)
Total Protein, Urine: NEGATIVE
Urine Glucose: NEGATIVE
Urobilinogen, UA: 1 (ref 0.0–1.0)
pH: 5.5 (ref 5.0–8.0)

## 2022-07-09 LAB — LIPID PANEL
Cholesterol: 117 mg/dL (ref 0–200)
HDL: 43.6 mg/dL (ref >39.00)
LDL Cholesterol: 58 mg/dL (ref 0–99)
NonHDL: 72.94
Total CHOL/HDL Ratio: 3
Triglycerides: 74 mg/dL (ref 0.0–149.0)
VLDL: 14.8 mg/dL (ref 0.0–40.0)

## 2022-07-09 LAB — HEMOGLOBIN A1C: Hgb A1c MFr Bld: 6.5 % (ref 4.6–6.5)

## 2022-07-09 LAB — TSH: TSH: 4.51 u[IU]/mL (ref 0.35–5.50)

## 2022-07-09 NOTE — Patient Instructions (Addendum)
Schedule your bone density test at check out desk.  - located [address] across the street from [place] - in the basement - you DO NEED an appointment for the bone density tests.   Tdap at pharmacy- check with them on price  [facility] contact Please call to schedule visit and/or procedure Address: [address] [place], [place], [place] [address] Phone: [phone]   We will call you within two weeks about your referral to audiology for hearing testing. If you do not hear within 2 weeks, give [place] a call.    Please stop by lab before you go If you have mychart- we will send your results within 3 business days of [place] receiving them.  If you do not have mychart- we will call you about results within 5 business days of [place] receiving them.  *please also note that you will see labs on mychart as soon as they post. I will later go in and write notes on them- will say "notes from Dr. Durene Cal"   Recommended follow up: Return in about 6 months (around 01/07/2023) for followup or sooner if needed.Schedule b4 you leave.

## 2022-07-09 NOTE — Progress Notes (Signed)
Phone [phone]   Subjective:  Patient presents today for their annual physical. Chief complaint-noted.   See problem oriented charting- ROS- full  review of systems was completed and negative except for: fatigue- stable, sinus pressure intermittent, hoarseness last month but went away, joint pain, dizziness (slightly more- as she had similar in the past with higher sugar intake but is not having more sugar), headaches unchanged, eye itching, pressure with urination that comes and goes- more chronic issue, hearing loss- place referral  The following were reviewed and entered/updated in epic: Past Medical History:  Diagnosis Date   Anemia 12/22/2017   Arthritis    Chronic lower back pain    Cluster headache    Coronary artery disease    a. 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 non obs dz LHC 2007. c. Non obs dz (mild-mod) by [facility] 05/17/14 d. cath 05/26/17 mild non obstructive CAD   COVID-19 08/07/2019   Diverticulitis    a. Hx microperf 2012 - hospitalizated.   GERD (gastroesophageal reflux disease)    Hypercholesteremia    Hypertension    Overactive bladder 09/19/2014   Oxybutynin 5mg  XL--> 10mg    PVC's (premature ventricular contractions)    a. Hx of trigeminy/bigeminy.   Seizures (HCC)    Thyroid disease    hyperthyroid   UTI (lower urinary tract infection)    Patient Active Problem List   Diagnosis Date Noted   WPW (Wolff-Parkinson-White syndrome) 03/05/2020    Priority: High   Heart failure with reduced ejection fraction (HCC) 08/21/2019    Priority: High   COVID-19 08/07/2019    Priority: High   Hyperthyroidism 05/22/2017    Priority: High   Migraine 09/19/2014    Priority: High   Coronary artery disease involving native coronary artery of native heart with angina pectoris (HCC)     Priority: High   Seizures (HCC)     Priority: High   Overactive bladder 09/19/2014    Priority: Medium    Hyperglycemia 09/19/2014    Priority: Medium    Hypercholesteremia     Priority:  Medium    GERD (gastroesophageal reflux disease) 10/04/2011    Priority: Medium    HTN (hypertension) 10/01/2011    Priority: Medium    Aortic atherosclerosis (HCC) 01/09/2020    Priority: Low   Hypokalemia 06/01/2014    Priority: Low   Arthritis     Priority: Low   Pain in left wrist 07/17/2021   Chest pain 02/03/2020   Anemia 12/22/2017   Past Surgical History:  Procedure Laterality Date   ABDOMINAL HYSTERECTOMY  1987   "partial"-still has ovaries   CARDIAC CATHETERIZATION  05/17/2014   CORONARY ANGIOPLASTY WITH STENT PLACEMENT  1995   "1"   JOINT REPLACEMENT     right knee   LEFT HEART CATH AND CORONARY ANGIOGRAPHY N/A 05/26/2017   Procedure: Left Heart Cath and Coronary Angiography;  Surgeon: 05/19/2014, MD;  Location: [facility] INVASIVE CV LAB;  Service: Cardiovascular;  Laterality: N/A;   LEFT HEART CATHETERIZATION WITH CORONARY ANGIOGRAM N/A 05/17/2014   Procedure: LEFT HEART CATHETERIZATION WITH CORONARY ANGIOGRAM;  Surgeon: [facility], MD;  Location: [facility] CATH LAB;  Service: Cardiovascular;  Laterality: N/A;   TOTAL KNEE ARTHROPLASTY Right 2005   TUBAL LIGATION  1970   VASCULAR SURGERY      Family History  Problem Relation Age of Onset   CAD Brother    Diabetes Brother    CAD Father    Diabetes Father    Diabetes  Mother        father, sister, brothers   Diabetes Sister    Thyroid disease Sister    Heart attack Brother    Prostate cancer Brother    Thyroid disease Sister    Diabetes Sister     Medications- reviewed and updated Current Outpatient Medications  Medication Sig Dispense Refill   acetaminophen (TYLENOL) 325 MG tablet Take 2 tablets (650 mg total) by mouth 4 (four) times daily. 30 tablet 0   Aloe Vera 25 MG CAPS Take by mouth.     amLODipine (NORVASC) 10 MG tablet TAKE 1 TABLET(10 MG) BY MOUTH DAILY 90 tablet 3   ASPIRIN LOW DOSE PO Take 81 mg by mouth daily.     atorvastatin (LIPITOR) 40 MG tablet TAKE 1 TABLET(40 MG) BY MOUTH DAILY 90  tablet 3   Cholecalciferol (VITAMIN D3) 25 MCG (1000 UT) CAPS Take by mouth.     Cobalamin Combinations (NEURIVA PLUS) CAPS Take by mouth.     Cyanocobalamin (VITAMIN B-12) 3000 MCG SUBL Place 3,000 mg under the tongue daily.     esomeprazole (NEXIUM) 20 MG capsule Take 1 capsule (20 mg total) by mouth daily. 90 capsule 3   ezetimibe (ZETIA) 10 MG tablet TAKE 1 TABLET(10 MG) BY MOUTH DAILY 90 tablet 3   furosemide (LASIX) 20 MG tablet Take 1 tablet (20 mg total) by mouth daily. 90 tablet 3   isosorbide mononitrate (IMDUR) 120 MG 24 hr tablet Take 1 tablet (120 mg total) by mouth daily. 90 tablet 3   methimazole (TAPAZOLE) 10 MG tablet Take 1 tablet (10 mg total) by mouth 2 (two) times daily. 180 tablet 1   metoprolol succinate (TOPROL-XL) 25 MG 24 hr tablet TAKE 1 TABLET(25 MG) BY MOUTH DAILY 90 tablet 3   nitroGLYCERIN (NITROSTAT) 0.4 MG SL tablet PLACE 1 TABLET UNDER THE TONGUE EVERY 5 MINUTES FOR CHEST PAIN FOR UPTO 3 DOSES 25 tablet 3   oxybutynin (DITROPAN-XL) 10 MG 24 hr tablet TAKE 1 TABLET BY MOUTH EVERY DAY 90 tablet 3   oxyCODONE-acetaminophen (PERCOCET) 10-325 MG tablet as needed. Taking as needed     potassium chloride (MICRO-K) 10 MEQ CR capsule Take 1 capsule (10 mEq total) by mouth 2 (two) times daily. 180 capsule 3   Prenatal Vit-Fe Fumarate-FA (PRENATAL MULTIVITAMIN) TABS tablet Take 1 tablet by mouth daily at 12 noon.     venlafaxine XR (EFFEXOR-XR) 150 MG 24 hr capsule TAKE 1 CAPSULE BY MOUTH AT BEDTIME. 30 capsule 5   vitamin C (VITAMIN C) 500 MG tablet Take 1 tablet (500 mg total) by mouth 2 (two) times daily. 14 tablet 0   Vitamin E 180 MG CAPS Take 2 capsules by mouth daily.     zinc sulfate 220 (50 Zn) MG capsule Take 1 capsule (220 mg total) by mouth daily. 7 capsule 0   No current facility-administered medications for this visit.    Allergies-reviewed and updated Allergies  Allergen Reactions   Crestor [Rosuvastatin] Other (See Comments)    Hot flashes and severe  cramps   Sulfa Drugs Cross Reactors Shortness Of Breath and Palpitations    Social History   Social History Narrative   Separated. 4 children from first marriage. 16 grandkids.       Retired from [employer] for 25 years, went to [employer] for 5 years. Retired 2003 after MI      Hobbies: babysit/active with children      Right-handed      Caffeine:  24 oz soda per day      Objective  Objective:  BP 108/64   Pulse (!) 59   Temp 98 F (36.7 C)   Ht 5\' 6"  (1.676 m)   Wt 148 lb 6.4 oz (67.3 kg)   SpO2 98%   BMI 23.95 kg/m  Gen: NAD, resting comfortably HEENT: Mucous membranes are moist. Oropharynx normal Neck: no thyromegaly CV: RRR no murmurs rubs or gallops Lungs: CTAB no crackles, wheeze, rhonchi Abdomen: soft/nontender/nondistended/normal bowel sounds. No rebound or guarding.  Ext: no edema Skin: warm, dry Neuro: grossly normal, moves all extremities, PERRLA, seems hard of hearing- speaks loudly   Assessment and Plan   76 y.o. female presenting for annual physical.  Health Maintenance counseling: 1. Anticipatory guidance: Patient counseled regarding regular dental exams - wears dentures, eye exams - plans to schedule- itching sensation at times,  avoiding smoking and second hand smoke , limiting alcohol to 1 beverage per day- never drinker , no illicit drugs .   2. Risk factor reduction:  Advised patient of need for regular exercise and diet rich and fruits and vegetables to reduce risk of heart attack and stroke.  Exercise- some walking- has bike but hasnt put together- stationary bike.  Diet/weight management-weight down 7 pounds from last year. Has ups and downs Wt Readings from Last 3 Encounters:  07/09/22 148 lb 6.4 oz (67.3 kg)  06/10/22 145 lb 6.4 oz (66 kg)  06/07/22 145 lb (65.8 kg)  3. Immunizations/screenings/ancillary studies- HD flu shot today , covid shot after new one released - opts out, TDAP at pharmacy recommended  Immunization History  Administered  Date(s) Administered   Influenza, High Dose Seasonal PF 08/17/2016, 08/06/2017   Influenza,inj,Quad PF,6+ Mos 10/11/2015   Janssen (J&J) SARS-COV-2 Vaccination 03/10/2020   Pneumococcal Conjugate-13 10/11/2015   Pneumococcal Polysaccharide-23 11/19/2016   Td 12/25/1995  4. Cervical cancer screening- past formal age based screening recommendations. No vaginal bleeding or discharge 5. Breast cancer screening-  mammogram -  reports does this yearly with what sounds like [facility] 6. Colon cancer screening - referred patient last year-encouraged her to call to schedule-gave number again today  7. Skin cancer screening-low risk due to melanin content. advised regular sunscreen use. Denies worrisome, changing, or new skin lesions.  8. Birth control/STD check- postmenopausal/states not sexually active 9. Osteoporosis screening at 40- referred last year-will refer again today 10. Smoking associated screening - never  Smoker  Status of chronic or acute concerns   #Nonobstructive CAD by cath in 2018 with angina thought primarily related to coronary vasospasm #hyperlipidemia #Aortic atherosclerosis (presumed stable- continue current meds)  S: Medication:Aspirin 81 mg, atorvastatin 40 mg (reported hot flashes and cramps on rosuvastatin 20 mg), Zetia 10 mg, Imdur 120 mg, metoprolol 25 mg extended release - sparing chest pain not worsening. No shortness of rbeath Lab Results  Component Value Date   CHOL 151 04/03/2021   HDL 45.30 04/03/2021   LDLCALC 88 04/03/2021   LDLDIRECT 68 11/29/2020   TRIG 88.0 04/03/2021   CHOLHDL 3 04/03/2021   A/P: CAD appears largely asymptomatic on imdur- continue current meds. Lipids just above goal last check- update lipids.   #Wolff-Parkinson-White-follows with Dr. 06/03/2021 at least in the past S: Per cardiology cannot increase metoprolol due to Wolff-Parkinson-White. A/P: No recent visit with Dr. Ladona Ridgel but has seen cardiology and they believe everything is  stable  #Heart failure with reduced ejection fraction.  Most recent ejection fraction improved to 55 to 60% in February  2021 S: Medication:Lasix 20 mg daily, also on potassium  Edema: no increase Weight gain:weight loss actually Shortness of breath: none Orthopnea/PND: denies A/P: stable/Appears to be euvolemic-continue current medication  #hypertension S: medication: Amlodipine 10 mg, Lasix 20 mg, Imdur 120 mg, metoprolol 25 mg extended release BP Readings from Last 3 Encounters:  07/09/22 108/64  06/10/22 (!) 102/54  06/07/22 (!) 147/85  A/P:  Controlled. Continue current medications.  Amlodipine ideal with CHF history but has been needed for control-if continues to run on the lower side may decrease this medication first   #hyperthyroidism-follows with endocrine S: compliant On thyroid medication-methimazole 30 mg in the past-now down to 10 mg BID in sept 2023  A/P:  hopefully stable- update TSH today. Continue current meds for now   #Migraines S: Medication:Effexor 150 mg extended release reportedly for headaches for years. A/P: Overall stable pattern-continue current medications  Seizures (HCC) S: Patient has reported history of seizures after injury in childhood following.  Had tolerated tramadol in the past for migraines without recurrent seizures.  No regular antiepileptic A/P: Thankfully no clear recurrence-continue current medication  # GERD S:Medication: Esomeprazole 20 mg B12 levels-takes B12 given long-term PPI  -gets symptoms if misses pill A/P: Reflux has been well controlled-mentioned trial of famotidine- she prefers to stay on same medicine   #Overactive bladder S: Medication: Oxybutynin 10 mg extended  release.  We discussed risks of confusion/falls - denies recent confusion/falls A/P: Controlled. Continue current medications.    #back pain- working with pain management on oxycodone-hydrocodone   She specifically requested A1c and TSH due to feeling  slightly off recently   Recommended follow up: Return in about 6 months (around 01/07/2023) for followup or sooner if needed.Schedule b4 you leave. Future Appointments  Date Time Provider Department Center  08/26/2022  8:10 AM Shamleffer, Konrad Dolores, MD [facility] None  11/03/2022  2:00 PM [facility] PHARMACIST [facility]   Lab/Order associations:NOT fasting- crakcers 10 am   ICD-10-CM   1. Preventative health care  Z00.00     2. Aortic atherosclerosis (HCC) Chronic I70.0     3. Seizures (HCC) Chronic R56.9     4. Coronary artery disease involving native coronary artery of native heart with angina pectoris (HCC) Chronic I25.119     5. Postmenopausal  Z78.0     6. Hypercholesteremia  E78.00     7. Hyperglycemia  R73.9     8. Hyperthyroidism  E05.90     9. Dysuria  R30.0      Return precautions advised.  Tana Conch, MD

## 2022-07-12 LAB — URINE CULTURE
MICRO NUMBER:: 13918001
SPECIMEN QUALITY:: ADEQUATE

## 2022-07-13 ENCOUNTER — Other Ambulatory Visit: Payer: Self-pay | Admitting: Family Medicine

## 2022-07-13 MED ORDER — CEPHALEXIN 500 MG PO CAPS: 500.0000 mg | ORAL_CAPSULE | Freq: Three times a day (TID) | ORAL | 0 refills | Status: AC

## 2022-07-14 ENCOUNTER — Ambulatory Visit: Payer: Medicare Other | Admitting: Audiologist

## 2022-07-14 ENCOUNTER — Other Ambulatory Visit: Payer: Self-pay | Admitting: Family Medicine

## 2022-07-15 ENCOUNTER — Other Ambulatory Visit: Payer: Self-pay | Admitting: Orthopaedic Surgery

## 2022-07-15 DIAGNOSIS — M25532 Pain in left wrist: Secondary | ICD-10-CM

## 2022-07-16 ENCOUNTER — Inpatient Hospital Stay: Admission: RE | Admit: 2022-07-16 | Payer: Medicare Other | Source: Ambulatory Visit

## 2022-07-21 ENCOUNTER — Ambulatory Visit: Payer: Medicare Other | Attending: Audiologist | Admitting: Audiologist

## 2022-07-21 DIAGNOSIS — H903 Sensorineural hearing loss, bilateral: Secondary | ICD-10-CM

## 2022-07-21 DIAGNOSIS — H61302 Acquired stenosis of left external ear canal, unspecified: Secondary | ICD-10-CM

## 2022-07-21 NOTE — Procedures (Signed)
  [facility] [place], [place]  [id number] [phone]  AUDIOLOGICAL  EVALUATION  NAME: [name]     DOB:   Apr 18, 1946      MRN: [id number]                                                                                     DATE: 07/21/2022     REFERENT: Marin Olp, MD STATUS: Outpatient DIAGNOSIS: Sensorineural Hearing Loss Bilateral, Collapsed Canal Left Ear     History: [name] was seen for an audiological evaluation.  [name] is receiving a hearing evaluation due to concerns for difficulty hearing from both ears, but mostly the left. [name] has difficulty hearing due to her left ear being 'closed off'. She was told there is nothing that can be done about this. This difficulty began gradually getting worse. No pain or pressure reported in either ear. Tinnitus intermittent in both ears. [name] struggles to hear her grand children who do not face her when they talk. She also cannot hear on the phone. No other relevant case history reported.   Evaluation:  Otoscopy showed a clear view of the tympanic membranes when left pinna is pulled up and back  Tympanometry results were consistent with normal middle ear compliance once left canal manually opened Audiometric testing was completed using conventional audiometry with insert transducer. Speech Recognition Thresholds were  35dB in the right ear and 35dB in the left ear. Word Recognition was performed 40 dB SL, scored  88% in the right ear and 8% in the left ear. Pure tone thresholds show normal sloping moderate sensorineural hearing loss symmetric in each ear.   Results:  The test results were reviewed with [name]. She has high pitched sensorineural moderate hearing loss consistent with age related changes. In the left ear a custom molded hearing aid will hold open that canal. The aids are made of plastic and silicone to keep her canals open and send the sound through. If she goes to see an  audiologist in person they will be able to make this kind of hearing aid for her. She reported understanding. She was given a list of local providers who work with custom hearing aids. She was also given a handout on how to check her hearing aid benefits with [employer].    Recommendations: Amplification is necessary for both ears. Hearing aids can be purchased from a variety of locations. See provided list for locations in the [place] area. Take envelope with printed copy of the audiogram when seeing audiologist for hearing aid trial.   36 minutes spent testing and counseling on results.   Alfonse Alpers  Audiologist, Au.D., CCC-A 07/21/2022  3:49 PM  Cc: Marin Olp, MD

## 2022-08-09 ENCOUNTER — Ambulatory Visit (INDEPENDENT_AMBULATORY_CARE_PROVIDER_SITE_OTHER): Payer: Medicare Other

## 2022-08-09 ENCOUNTER — Other Ambulatory Visit: Payer: Self-pay

## 2022-08-09 ENCOUNTER — Ambulatory Visit (HOSPITAL_COMMUNITY)
Admission: EM | Admit: 2022-08-09 | Discharge: 2022-08-09 | Disposition: A | Discharge status: Home / Self Care | Payer: Medicare Other | Attending: Internal Medicine | Admitting: Internal Medicine

## 2022-08-09 ENCOUNTER — Encounter (HOSPITAL_COMMUNITY): Payer: Self-pay | Admitting: *Deleted

## 2022-08-09 DIAGNOSIS — S6991XA Unspecified injury of right wrist, hand and finger(s), initial encounter: Secondary | ICD-10-CM | POA: Diagnosis not present

## 2022-08-09 DIAGNOSIS — W19XXXA Unspecified fall, initial encounter: Secondary | ICD-10-CM

## 2022-08-09 DIAGNOSIS — M79641 Pain in right hand: Secondary | ICD-10-CM

## 2022-08-09 DIAGNOSIS — M7989 Other specified soft tissue disorders: Secondary | ICD-10-CM | POA: Diagnosis not present

## 2022-08-09 NOTE — Discharge Instructions (Signed)
Your x-rays of your hand were negative for fracture or dislocation. You likely sprained your hand/wrist.   Wear the Ace wrap we provided in the clinic for the next couple of days to provide compression, stability, and comfort.  Please rest, ice, and elevate your hand/wrist to help it heal and decrease inflammation.   Take 1000 mg of Tylenol every 6 hours as needed for pain.  Call the orthopedic provider listed on your discharge paperwork to schedule a follow-up appointment if your symptoms do not improve in the next 1-2 weeks with supportive care.  Return to urgent care if you experience worsening pain, numbness, tingling, change of color in your skin near the injury, or any other concerning symptoms.  I hope you feel better!

## 2022-08-09 NOTE — ED Provider Notes (Signed)
[place]    CSN: [id number] Arrival date & time: 08/09/22  1412      History   Chief Complaint Chief Complaint  Patient presents with   Hand Injury    HPI [name] is a 76 y.o. female.   Patient presents urgent care for evaluation of right hand pain and swelling that started yesterday after she fell while on her walk.  She denies preceding dizziness, shortness of breath, chest pain, nausea, vomiting, or lightheadedness prior to falling.  States she was on her normal daily walk with a different walking stick than she normally uses when she suddenly found herself in the middle of a mechanical fall.  She landed onto her right side after falling and her right hand/wrist broke most of the fall.  She is currently complaining of 8 on a scale of 0-10 pain to the generalized right hand.  She has not applied ice to the right hand or taking any medications for her pain and inflammation.  Denies numbness and tingling to the bilateral upper extremities, syncope, headache, laceration/abrasion, and use of blood thinning medications.  She did not hit her head during the fall and denies recent falls prior to yesterday.  She was able to get up off of the ground by herself and walk back home without difficulty.  Denies diabetes diagnosis.   Hand Injury   Past Medical History:  Diagnosis Date   Anemia 12/22/2017   Arthritis    Chronic lower back pain    Cluster headache    Coronary artery disease    a. 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 non obs dz LHC 2007. c. Non obs dz (mild-mod) by [facility] 05/17/14 d. cath 05/26/17 mild non obstructive CAD   COVID-19 08/07/2019   Diverticulitis    a. Hx microperf 2012 - hospitalizated.   GERD (gastroesophageal reflux disease)    Hypercholesteremia    Hypertension    Overactive bladder 09/19/2014   Oxybutynin 5mg  XL--> 10mg    PVC's (premature ventricular contractions)    a. Hx of trigeminy/bigeminy.   Seizures ([place])    Thyroid disease    hyperthyroid    UTI (lower urinary tract infection)     Patient Active Problem List   Diagnosis Date Noted   Pain in left wrist 07/17/2021   WPW (Wolff-Parkinson-White syndrome) 03/05/2020   Chest pain 02/03/2020   Aortic atherosclerosis ([place]) 01/09/2020   Heart failure with reduced ejection fraction ([place]) 08/21/2019   COVID-19 08/07/2019   Anemia 12/22/2017   Hyperthyroidism 05/22/2017   Overactive bladder 09/19/2014   Migraine 09/19/2014   Hyperglycemia 09/19/2014   Hypokalemia 06/01/2014   Coronary artery disease involving native coronary artery of native heart with angina pectoris ([place])    Seizures ([place])    Arthritis    Hypercholesteremia    GERD (gastroesophageal reflux disease) 10/04/2011   HTN (hypertension) 10/01/2011    Past Surgical History:  Procedure Laterality Date   ABDOMINAL HYSTERECTOMY  1987   "partial"-still has ovaries   CARDIAC CATHETERIZATION  05/17/2014   [place]   "1"   JOINT REPLACEMENT     right knee   LEFT HEART CATH AND CORONARY ANGIOGRAPHY N/A 05/26/2017   Procedure: Left Heart Cath and Coronary Angiography;  Surgeon: Jettie Booze, MD;  Location: [facility];  Service: Cardiovascular;  Laterality: N/A;   LEFT HEART CATHETERIZATION WITH CORONARY ANGIOGRAM N/A 05/17/2014   Procedure: LEFT HEART CATHETERIZATION WITH CORONARY ANGIOGRAM;  Surgeon: Leonie Green  Ellyn Hack, MD;  Location: [facility] CATH LAB;  Service: Cardiovascular;  Laterality: N/A;   TOTAL KNEE ARTHROPLASTY Right 2005   TUBAL LIGATION  1970   VASCULAR SURGERY      OB History   No obstetric history on file.      Home Medications    Prior to Admission medications   Medication Sig Start Date End Date Taking? Authorizing Provider  acetaminophen (TYLENOL) 325 MG tablet Take 2 tablets (650 mg total) by mouth 4 (four) times daily. 06/07/22   Mesner, Corene Cornea, MD  Aloe Vera 25 MG CAPS Take by mouth.    [provider]  amLODipine (NORVASC) 10 MG tablet  TAKE 1 TABLET(10 MG) BY MOUTH DAILY 01/06/22   Nicholes Rough N, PA-C  ASPIRIN LOW DOSE PO Take 81 mg by mouth daily.    [provider]  atorvastatin (LIPITOR) 40 MG tablet TAKE 1 TABLET(40 MG) BY MOUTH DAILY 01/06/22   Elgie Collard, PA-C  Cholecalciferol (VITAMIN D3) 25 MCG (1000 UT) CAPS Take by mouth.    [provider]  Cobalamin Combinations (NEURIVA PLUS) CAPS Take by mouth.    [provider]  Cyanocobalamin (VITAMIN B-12) 3000 MCG SUBL Place 3,000 mg under the tongue daily.    [provider]  esomeprazole (NEXIUM) 20 MG capsule TAKE 1 CAPSULE(20 MG) BY MOUTH DAILY 07/14/22   Marin Olp, MD  ezetimibe (ZETIA) 10 MG tablet TAKE 1 TABLET(10 MG) BY MOUTH DAILY 01/06/22   Elgie Collard, PA-C  furosemide (LASIX) 20 MG tablet Take 1 tablet (20 mg total) by mouth daily. 01/06/22   Elgie Collard, PA-C  isosorbide mononitrate (IMDUR) 120 MG 24 hr tablet Take 1 tablet (120 mg total) by mouth daily. 01/06/22   Elgie Collard, PA-C  methimazole (TAPAZOLE) 10 MG tablet Take 1 tablet (10 mg total) by mouth 2 (two) times daily. 04/22/22   Shamleffer, Melanie Crazier, MD  metoprolol succinate (TOPROL-XL) 25 MG 24 hr tablet TAKE 1 TABLET(25 MG) BY MOUTH DAILY 01/06/22   Elgie Collard, PA-C  nitroGLYCERIN (NITROSTAT) 0.4 MG SL tablet PLACE 1 TABLET UNDER THE TONGUE EVERY 5 MINUTES FOR CHEST PAIN FOR UPTO 3 DOSES 01/06/22   Elgie Collard, PA-C  oxybutynin (DITROPAN-XL) 10 MG 24 hr tablet TAKE 1 TABLET BY MOUTH EVERY DAY 05/07/22   Marin Olp, MD  oxyCODONE-acetaminophen (PERCOCET) 10-325 MG tablet as needed. Taking as needed 12/19/21   [provider]  potassium chloride (MICRO-K) 10 MEQ CR capsule Take 1 capsule (10 mEq total) by mouth 2 (two) times daily. 01/06/22   Elgie Collard, PA-C  Prenatal Vit-Fe Fumarate-FA (PRENATAL MULTIVITAMIN) TABS tablet Take 1 tablet by mouth daily at 12 noon.    [provider]  venlafaxine XR (EFFEXOR-XR) 150 MG 24  hr capsule TAKE 1 CAPSULE BY MOUTH AT BEDTIME. 05/07/22   Marin Olp, MD  vitamin C (VITAMIN C) 500 MG tablet Take 1 tablet (500 mg total) by mouth 2 (two) times daily. 08/08/19   Shelly Coss, MD  Vitamin E 180 MG CAPS Take 2 capsules by mouth daily.    [provider]  zinc sulfate 220 (50 Zn) MG capsule Take 1 capsule (220 mg total) by mouth daily. 08/09/19   Shelly Coss, MD    Family History Family History  Problem Relation Age of Onset   CAD Brother    Diabetes Brother    CAD Father    Diabetes Father    Diabetes  Mother        father, sister, brothers   Diabetes Sister    Thyroid disease Sister    Heart attack Brother    Prostate cancer Brother    Thyroid disease Sister    Diabetes Sister     Social History Social History   Tobacco Use   Smoking status: Never   Smokeless tobacco: Never  Vaping Use   Vaping Use: Never used  Substance Use Topics   Alcohol use: No   Drug use: No     Allergies   Crestor [rosuvastatin] and Sulfa drugs cross reactors   Review of Systems Review of Systems Per HPI  Physical Exam Triage Vital Signs ED Triage Vitals  Enc Vitals Group     BP 08/09/22 1508 132/74     Pulse Rate 08/09/22 1508 (!) 58     Resp 08/09/22 1508 18     Temp 08/09/22 1508 98.2 F (36.8 C)     Temp src --      SpO2 08/09/22 1508 100 %     Weight --      Height --      Head Circumference --      Peak Flow --      Pain Score 08/09/22 1505 8     Pain Loc --      Pain Edu? --      Excl. in [place]? --    No data found.  Updated Vital Signs BP 132/74   Pulse (!) 58   Temp 98.2 F (36.8 C)   Resp 18   SpO2 100%   Visual Acuity Right Eye Distance:   Left Eye Distance:   Bilateral Distance:    Right Eye Near:   Left Eye Near:    Bilateral Near:     Physical Exam Vitals and nursing note reviewed.  Constitutional:      Appearance: She is not ill-appearing or toxic-appearing.  HENT:     Head: Normocephalic and atraumatic.      Right Ear: Hearing and external ear normal.     Left Ear: Hearing and external ear normal.     Nose: Nose normal.     Mouth/Throat:     Lips: Pink.     Mouth: Mucous membranes are moist.     Pharynx: No posterior oropharyngeal erythema.  Eyes:     General: Lids are normal. Vision grossly intact. Gaze aligned appropriately.     Extraocular Movements: Extraocular movements intact.     Conjunctiva/sclera: Conjunctivae normal.  Pulmonary:     Effort: Pulmonary effort is normal.  Musculoskeletal:     Cervical back: Neck supple.     Comments: Right hand/wrist: Mild swelling to the dorsal aspect of the right hand. 5/5 grip strength to the bilateral upper extremities. Sensation intact distal to injury, +2 radial pulses bilaterally, capillary refill less than 3 to affected hand. Full ROM of the right wrist.   Skin:    General: Skin is warm and dry.     Capillary Refill: Capillary refill takes less than 2 seconds.     Findings: No rash.  Neurological:     General: No focal deficit present.     Mental Status: She is alert and oriented to person, place, and time. Mental status is at baseline.     Cranial Nerves: No dysarthria or facial asymmetry.  Psychiatric:        Mood and Affect: Mood normal.        Speech: Speech  normal.        Behavior: Behavior normal.        Thought Content: Thought content normal.        Judgment: Judgment normal.      UC Treatments / Results  Labs (all labs ordered are listed, but only abnormal results are displayed) Labs Reviewed - No data to display  EKG   Radiology DG Hand Complete Right  Result Date: 08/09/2022 CLINICAL DATA:  Fall, right hand pain EXAM: RIGHT HAND - COMPLETE 3+ VIEW COMPARISON:  None Available. FINDINGS: There is no evidence of fracture or dislocation. Moderate osteoarthritic changes throughout the hand. Mild soft tissue swelling dorsally. IMPRESSION: No acute fracture or dislocation of the right hand. Mild dorsal soft tissue  swelling. Electronically Signed   By: Davina Poke D.O.   On: 08/09/2022 15:43    Procedures Procedures (including critical care time)  Medications Ordered in UC Medications - No data to display  Initial Impression / Assessment and Plan / UC Course  I have reviewed the triage vital signs and the nursing notes.  Pertinent labs & imaging results that were available during my care of the patient were reviewed by me and considered in my medical decision making (see chart for details).   1. Injury of right hand X-ray negative for acute bony abnormality or fracture. We will manage this as an acute sprain with tylenol, RICE, and an ACE wrap to the right hand/wrist to reduce swelling. Follow-up with orthopedics in the next 1-2 weeks advised and walking referral given to ortho as needed. She is neurovascularly intact to the right hand and without focal neurologic deficit. Patient expresses understanding and agreement with plan.  Discussed physical exam and available lab work findings in clinic with patient.  Counseled patient regarding appropriate use of medications and potential side effects for all medications recommended or prescribed today. Discussed red flag signs and symptoms of worsening condition,when to call the PCP office, return to urgent care, and when to seek higher level of care in the emergency department. Patient verbalizes understanding and agreement with plan. All questions answered. Patient discharged in stable condition.    Final Clinical Impressions(s) / UC Diagnoses   Final diagnoses:  Injury of right hand, initial encounter  Right hand pain     Discharge Instructions      Your x-rays of your hand were negative for fracture or dislocation. You likely sprained your hand/wrist.   Wear the Ace wrap we provided in the clinic for the next couple of days to provide compression, stability, and comfort.  Please rest, ice, and elevate your hand/wrist to help it heal and  decrease inflammation.   Take 1000 mg of Tylenol every 6 hours as needed for pain.  Call the orthopedic provider listed on your discharge paperwork to schedule a follow-up appointment if your symptoms do not improve in the next 1-2 weeks with supportive care.  Return to urgent care if you experience worsening pain, numbness, tingling, change of color in your skin near the injury, or any other concerning symptoms.  I hope you feel better!     ED Prescriptions   None    PDMP not reviewed this encounter.   Talbot Grumbling, [place] 08/09/22 2007

## 2022-08-09 NOTE — ED Triage Notes (Signed)
Pt reports she fell Friday night. Pt presents with pain and swelling to RT hand and fingers.

## 2022-08-26 ENCOUNTER — Ambulatory Visit: Payer: Medicare Other | Admitting: Internal Medicine

## 2022-08-26 NOTE — Progress Notes (Deleted)
Name: [name]  MRN/ DOB: [id number], 04/11/1946    Age/ Sex: 76 y.o., female     PCP: Marin Olp, MD   Reason for Endocrinology Evaluation: Hyperthyroidism     Initial Endocrinology Clinic Visit: 06/10/2017    PATIENT IDENTIFIER: [name] is a 76 y.o., female with a past medical history of HTN, hyperthyroid, PVCs, CAD and dyslipidemia. She has followed with [facility] since 06/10/2017 for consultative assistance with management of her Hyperthyroidism.   HISTORICAL SUMMARY: The patient was first diagnosed with Hyperthyroidism in 2012.  This has been attributed to Graves' disease she has been on Methimazole since her dx.    Thyroid ultrasound in 2018 was unrevealing TSI elevated >700   Sister with thyroid disease   SUBJECTIVE:     Today (08/26/2022):  [name] is here for a follow up on hyperthyroid management.   Weight has been decreasing  Methimazole 10 mg, 3 tabs daily at night  Denies anxiety and jittery sensation  Denies palpitations  Denies loose stools or diarrhea , mostly has constipation which is chronic  Minimal local swelling   Methimazole 10 mg twice a day   HISTORY:  Past Medical History:  Past Medical History:  Diagnosis Date   Anemia 12/22/2017   Arthritis    Chronic lower back pain    Cluster headache    Coronary artery disease    a. 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 non obs dz LHC 2007. c. Non obs dz (mild-mod) by [facility] 05/17/14 d. cath 05/26/17 mild non obstructive CAD   COVID-19 08/07/2019   Diverticulitis    a. Hx microperf 2012 - hospitalizated.   GERD (gastroesophageal reflux disease)    Hypercholesteremia    Hypertension    Overactive bladder 09/19/2014   Oxybutynin 5mg  XL--> 10mg    PVC's (premature ventricular contractions)    a. Hx of trigeminy/bigeminy.   Seizures ([place])    Thyroid disease    hyperthyroid   UTI (lower urinary tract infection)    Past Surgical History:  Past Surgical History:  Procedure  Laterality Date   ABDOMINAL HYSTERECTOMY  1987   "partial"-still has ovaries   CARDIAC CATHETERIZATION  05/17/2014   CORONARY ANGIOPLASTY WITH STENT PLACEMENT  1995   "1"   JOINT REPLACEMENT     right knee   LEFT HEART CATH AND CORONARY ANGIOGRAPHY N/A 05/26/2017   Procedure: Left Heart Cath and Coronary Angiography;  Surgeon: Jettie Booze, MD;  Location: [facility];  Service: Cardiovascular;  Laterality: N/A;   LEFT HEART CATHETERIZATION WITH CORONARY ANGIOGRAM N/A 05/17/2014   Procedure: LEFT HEART CATHETERIZATION WITH CORONARY ANGIOGRAM;  Surgeon: Leonie Man, MD;  Location: [facility] C[facility] LAB;  Service: Cardiovascular;  Laterality: N/A;   TOTAL KNEE ARTHROPLASTY Right 2005   TUBAL LIGATION  1970   VASCULAR SURGERY     Social History:  reports that she has never smoked. She has never used smokeless tobacco. She reports that she does not drink alcohol and does not use drugs. Family History:  Family History  Problem Relation Age of Onset   CAD Brother    Diabetes Brother    CAD Father    Diabetes Father    Diabetes Mother        father, sister, brothers   Diabetes Sister    Thyroid disease Sister    Heart attack Brother    Prostate cancer Brother    Thyroid disease Sister    Diabetes Sister  HOME MEDICATIONS: Allergies as of 08/26/2022       Reactions   Crestor [rosuvastatin] Other (See Comments)   Hot flashes and severe cramps   Sulfa Drugs Cross Reactors Shortness Of Breath, Palpitations        Medication List        Accurate as of August 26, 2022  7:12 AM. If you have any questions, ask your nurse or doctor.          acetaminophen 325 MG tablet Commonly known as: TYLENOL Take 2 tablets (650 mg total) by mouth 4 (four) times daily.   Aloe Vera 25 MG Caps Take by mouth.   amLODipine 10 MG tablet Commonly known as: NORVASC TAKE 1 TABLET(10 MG) BY MOUTH DAILY   ascorbic acid 500 MG tablet Commonly known as: VITAMIN C Take 1 tablet (500  mg total) by mouth 2 (two) times daily.   ASPIRIN LOW DOSE PO Take 81 mg by mouth daily.   atorvastatin 40 MG tablet Commonly known as: LIPITOR TAKE 1 TABLET(40 MG) BY MOUTH DAILY   esomeprazole 20 MG capsule Commonly known as: NEXIUM TAKE 1 CAPSULE(20 MG) BY MOUTH DAILY   ezetimibe 10 MG tablet Commonly known as: ZETIA TAKE 1 TABLET(10 MG) BY MOUTH DAILY   furosemide 20 MG tablet Commonly known as: LASIX Take 1 tablet (20 mg total) by mouth daily.   isosorbide mononitrate 120 MG 24 hr tablet Commonly known as: IMDUR Take 1 tablet (120 mg total) by mouth daily.   methimazole 10 MG tablet Commonly known as: TAPAZOLE Take 1 tablet (10 mg total) by mouth 2 (two) times daily.   metoprolol succinate 25 MG 24 hr tablet Commonly known as: TOPROL-XL TAKE 1 TABLET(25 MG) BY MOUTH DAILY   Neuriva Plus Caps Take by mouth.   nitroGLYCERIN 0.4 MG SL tablet Commonly known as: NITROSTAT PLACE 1 TABLET UNDER THE TONGUE EVERY 5 MINUTES FOR CHEST PAIN FOR UPTO 3 DOSES   oxybutynin 10 MG 24 hr tablet Commonly known as: DITROPAN-XL TAKE 1 TABLET BY MOUTH EVERY DAY   oxyCODONE-acetaminophen 10-325 MG tablet Commonly known as: PERCOCET as needed. Taking as needed   potassium chloride 10 MEQ CR capsule Commonly known as: MICRO-K Take 1 capsule (10 mEq total) by mouth 2 (two) times daily.   prenatal multivitamin Tabs tablet Take 1 tablet by mouth daily at 12 noon.   venlafaxine XR 150 MG 24 hr capsule Commonly known as: EFFEXOR-XR TAKE 1 CAPSULE BY MOUTH AT BEDTIME.   Vitamin B-12 3000 MCG Subl Place 3,000 mg under the tongue daily.   Vitamin D3 25 MCG (1000 UT) Caps Take by mouth.   Vitamin E 180 MG Caps Take 2 capsules by mouth daily.   zinc sulfate 220 (50 Zn) MG capsule Take 1 capsule (220 mg total) by mouth daily.          OBJECTIVE:   PHYSICAL EXAM: VS: There were no vitals taken for this visit.   EXAM: General: Pt appears well and is in NAD  Eyes:  External eye exam normal without stare, lid lag or exophthalmos.  EOM intact.  Neck: General: Supple without adenopathy. Thyroid: Thyroid size normal.  No goiter or nodules appreciated. No thyroid bruit.  Lungs: Clear with good BS bilat with no rales, rhonchi, or wheezes  Heart: Auscultation: RRR.  Abdomen: Normoactive bowel sounds, soft, nontender, without masses or organomegaly palpable  Extremities:  BL LE: No pretibial edema normal ROM and strength.  Mental Status: Judgment, insight: Intact  Orientation: Oriented to time, place, and person Mood and affect: No depression, anxiety, or agitation     DATA REVIEWED:  Latest Reference Range & Units 04/22/22 08:26  TSH 0.35 - 5.50 uIU/mL 0.01 Repeated and verified X2. (L)  T4,Free(Direct) 0.60 - 1.60 ng/dL 1.08     TSI <140 % baseline >700 High      Thyroid ultrasound 05/23/2017  FINDINGS: Parenchymal Echotexture: Mildly heterogenous, hyperemic   Isthmus: 0.2 cm thickness   Right lobe: 4.6 x 2.3 x 1.8 cm   Left lobe: 4.4 x 2 x 1.4 cm   _________________________________________________________   Estimated total number of nodules >/= 1 cm: 0   Number of spongiform nodules >/=  2 cm not described below (TR1): 0   Number of mixed cystic and solid nodules >/= 1.5 cm not described below (TR2): 0   _________________________________________________________   No discrete nodules are seen within the thyroid gland.   IMPRESSION: 1. Heterogeneous hyperemic thyroid, normal in size without nodule.   ASSESSMENT / PLAN / RECOMMENDATIONS:   Hyperthyroidism:   -The patient is clinically hypothyroid - No local neck symptoms  -I am not clear about how much methimazole she is taking.  She tells me she has been taking 3 tablets at night but her latest refill was for 1 tablet daily #30 -I am going to adjust as below and see how she does  Medications  Methimazole 10 mg twice a day    2.  Graves' disease:  - No extra thyroidal  manifestations of Graves' disease    F/U in 4 months  Labs in 8 weeks      Signed electronically by: Mack Guise, MD  [facility]  [facility] [address] [place], [place] [address] Phone: [phone] FAX: [phone]      CC: Marin Olp, [place] [address] Phone: [phone]  Fax: [phone]   Return to Endocrinology clinic as below: Future Appointments  Date Time Provider [place]  08/26/2022  8:10 AM Kathrine Rieves, Melanie Crazier, MD [facility] None  11/03/2022  2:00 PM [facility] CCM PHARMACIST [facility]  01/07/2023  1:20 PM Marin Olp, MD [facility]

## 2022-08-27 ENCOUNTER — Telehealth: Payer: Self-pay | Admitting: *Deleted

## 2022-08-27 NOTE — Patient Outreach (Signed)
  Care Coordination   08/27/2022 Name: Daysie Helf MRN: [id number] DOB: April 02, 1946   Care Coordination Outreach Attempts:  An unsuccessful telephone outreach was attempted today to offer the patient information about available care coordination services as a benefit of their health plan.   Follow Up Plan:  Additional outreach attempts will be made to offer the patient care coordination information and services.   Encounter Outcome:  No Answer  Care Coordination Interventions Activated:  No   Care Coordination Interventions:  No, not indicated     Raina Mina, RN Care Management Coordinator [place] Office [phone]

## 2022-09-16 ENCOUNTER — Encounter: Payer: Self-pay | Admitting: Family Medicine

## 2022-09-16 ENCOUNTER — Ambulatory Visit (INDEPENDENT_AMBULATORY_CARE_PROVIDER_SITE_OTHER): Payer: Medicare Other | Admitting: Family Medicine

## 2022-09-16 VITALS — BP 130/80 | HR 61 | Temp 98.1°F | Ht 66.0 in | Wt 150.6 lb

## 2022-09-16 DIAGNOSIS — I10 Essential (primary) hypertension: Secondary | ICD-10-CM

## 2022-09-16 DIAGNOSIS — M545 Low back pain, unspecified: Secondary | ICD-10-CM

## 2022-09-16 DIAGNOSIS — E119 Type 2 diabetes mellitus without complications: Secondary | ICD-10-CM

## 2022-09-16 DIAGNOSIS — E0590 Thyrotoxicosis, unspecified without thyrotoxic crisis or storm: Secondary | ICD-10-CM

## 2022-09-16 DIAGNOSIS — I5020 Unspecified systolic (congestive) heart failure: Secondary | ICD-10-CM | POA: Diagnosis not present

## 2022-09-16 DIAGNOSIS — G8929 Other chronic pain: Secondary | ICD-10-CM

## 2022-09-16 LAB — COMPREHENSIVE METABOLIC PANEL
ALT: 16 U/L (ref 0–35)
AST: 19 U/L (ref 0–37)
Albumin: 3.9 g/dL (ref 3.5–5.2)
Alkaline Phosphatase: 66 U/L (ref 39–117)
BUN: 13 mg/dL (ref 6–23)
CO2: 25 mEq/L (ref 19–32)
Calcium: 9 mg/dL (ref 8.4–10.5)
Chloride: 108 mEq/L (ref 96–112)
Creatinine, Ser: 0.99 mg/dL (ref 0.40–1.20)
GFR: 55.63 mL/min — ABNORMAL LOW (ref >60.00)
Glucose, Bld: 88 mg/dL (ref 70–99)
Potassium: 3.9 mEq/L (ref 3.5–5.1)
Sodium: 138 mEq/L (ref 135–145)
Total Bilirubin: 0.7 mg/dL (ref 0.2–1.2)
Total Protein: 6.8 g/dL (ref 6.0–8.3)

## 2022-09-16 LAB — CBC WITH DIFFERENTIAL/PLATELET
Basophils Absolute: 0 10*3/uL (ref 0.0–0.1)
Basophils Relative: 0.7 % (ref 0.0–3.0)
Eosinophils Absolute: 0.3 10*3/uL (ref 0.0–0.7)
Eosinophils Relative: 8 % — ABNORMAL HIGH (ref 0.0–5.0)
HCT: 33.6 % — ABNORMAL LOW (ref 36.0–46.0)
Hemoglobin: 11.4 g/dL — ABNORMAL LOW (ref 12.0–15.0)
Lymphocytes Relative: 49.6 % — ABNORMAL HIGH (ref 12.0–46.0)
Lymphs Abs: 1.7 10*3/uL (ref 0.7–4.0)
MCHC: 34 g/dL (ref 30.0–36.0)
MCV: 96.9 fl (ref 78.0–100.0)
Monocytes Absolute: 0.4 10*3/uL (ref 0.1–1.0)
Monocytes Relative: 11.5 % (ref 3.0–12.0)
Neutro Abs: 1 10*3/uL — ABNORMAL LOW (ref 1.4–7.7)
Neutrophils Relative %: 30.2 % — ABNORMAL LOW (ref 43.0–77.0)
Platelets: 183 10*3/uL (ref 150.0–400.0)
RBC: 3.47 Mil/uL — ABNORMAL LOW (ref 3.87–5.11)
RDW: 14.5 % (ref 11.5–15.5)
WBC: 3.4 10*3/uL — ABNORMAL LOW (ref 4.0–10.5)

## 2022-09-16 LAB — MICROALBUMIN / CREATININE URINE RATIO
Creatinine,U: 243.6 mg/dL
Microalb Creat Ratio: 0.7 mg/g (ref 0.0–30.0)
Microalb, Ur: 1.6 mg/dL (ref 0.0–1.9)

## 2022-09-16 LAB — T3, FREE: T3, Free: 3.5 pg/mL (ref 2.3–4.2)

## 2022-09-16 LAB — T4, FREE: Free T4: 0.74 ng/dL (ref 0.60–1.60)

## 2022-09-16 LAB — TSH: TSH: 0.39 u[IU]/mL (ref 0.35–5.50)

## 2022-09-16 NOTE — Progress Notes (Addendum)
Phone [phone] In person visit   Subjective:   [name] is a 76 y.o. year old very pleasant female patient who presents for/with See problem oriented charting Chief Complaint  Patient presents with   Follow-up   Diabetes   Hypertension    Would like kindeys rechecked.   Hypothyroidism   Back Pain    Pt c/o excruciating back pain that started after a fall 1 month ago.   Past Medical History-  Patient Active Problem List   Diagnosis Date Noted   WPW (Wolff-Parkinson-White syndrome) 03/05/2020    Priority: High   Heart failure with reduced ejection fraction (HCC) 08/21/2019    Priority: High   COVID-19 08/07/2019    Priority: High   Hyperthyroidism 05/22/2017    Priority: High   Migraine 09/19/2014    Priority: High   Diabetes mellitus type II, controlled (HCC) 09/19/2014    Priority: High   Coronary artery disease involving native coronary artery of native heart with angina pectoris (HCC)     Priority: High   Seizures (HCC)     Priority: High   Overactive bladder 09/19/2014    Priority: Medium    Hypercholesteremia     Priority: Medium    GERD (gastroesophageal reflux disease) 10/04/2011    Priority: Medium    HTN (hypertension) 10/01/2011    Priority: Medium    Aortic atherosclerosis (HCC) 01/09/2020    Priority: Low   Hypokalemia 06/01/2014    Priority: Low   Arthritis     Priority: Low   Pain in left wrist 07/17/2021   Chest pain 02/03/2020   Anemia 12/22/2017    Medications- reviewed and updated Current Outpatient Medications  Medication Sig Dispense Refill   acetaminophen (TYLENOL) 325 MG tablet Take 2 tablets (650 mg total) by mouth 4 (four) times daily. 30 tablet 0   Aloe Vera 25 MG CAPS Take by mouth.     amLODipine (NORVASC) 10 MG tablet TAKE 1 TABLET(10 MG) BY MOUTH DAILY 90 tablet 3   ASPIRIN LOW DOSE PO Take 81 mg by mouth daily.     atorvastatin (LIPITOR) 40 MG tablet TAKE 1 TABLET(40 MG) BY MOUTH DAILY 90 tablet 3   Cholecalciferol  (VITAMIN D3) 25 MCG (1000 UT) CAPS Take by mouth.     Cobalamin Combinations (NEURIVA PLUS) CAPS Take by mouth.     Cyanocobalamin (VITAMIN B-12) 3000 MCG SUBL Place 3,000 mg under the tongue daily.     esomeprazole (NEXIUM) 20 MG capsule TAKE 1 CAPSULE(20 MG) BY MOUTH DAILY 90 capsule 3   ezetimibe (ZETIA) 10 MG tablet TAKE 1 TABLET(10 MG) BY MOUTH DAILY 90 tablet 3   furosemide (LASIX) 20 MG tablet Take 1 tablet (20 mg total) by mouth daily. 90 tablet 3   isosorbide mononitrate (IMDUR) 120 MG 24 hr tablet Take 1 tablet (120 mg total) by mouth daily. 90 tablet 3   methimazole (TAPAZOLE) 10 MG tablet Take 1 tablet (10 mg total) by mouth 2 (two) times daily. 180 tablet 1   metoprolol succinate (TOPROL-XL) 25 MG 24 hr tablet TAKE 1 TABLET(25 MG) BY MOUTH DAILY 90 tablet 3   nitroGLYCERIN (NITROSTAT) 0.4 MG SL tablet PLACE 1 TABLET UNDER THE TONGUE EVERY 5 MINUTES FOR CHEST PAIN FOR UPTO 3 DOSES 25 tablet 3   oxybutynin (DITROPAN-XL) 10 MG 24 hr tablet TAKE 1 TABLET BY MOUTH EVERY DAY 90 tablet 3   oxyCODONE-acetaminophen (PERCOCET) 10-325 MG tablet as needed. Taking as needed     potassium  chloride (MICRO-K) 10 MEQ CR capsule Take 1 capsule (10 mEq total) by mouth 2 (two) times daily. 180 capsule 3   Prenatal Vit-Fe Fumarate-FA (PRENATAL MULTIVITAMIN) TABS tablet Take 1 tablet by mouth daily at 12 noon.     venlafaxine XR (EFFEXOR-XR) 150 MG 24 hr capsule TAKE 1 CAPSULE BY MOUTH AT BEDTIME. 30 capsule 5   vitamin C (VITAMIN C) 500 MG tablet Take 1 tablet (500 mg total) by mouth 2 (two) times daily. 14 tablet 0   Vitamin E 180 MG CAPS Take 2 capsules by mouth daily.     zinc sulfate 220 (50 Zn) MG capsule Take 1 capsule (220 mg total) by mouth daily. 7 capsule 0   No current facility-administered medications for this visit.     Objective:  BP 130/80   Pulse 61   Temp 98.1 F (36.7 C)   Ht 5\' 6"  (1.676 m)   Wt 150 lb 9.6 oz (68.3 kg)   SpO2 98%   BMI 24.31 kg/m  Gen: NAD, resting  comfortably CV: RRR no murmurs rubs or gallops Lungs: CTAB no crackles, wheeze, rhonchi  Ext:  minimal edema Skin: warm, dry  Diabetic Foot Exam - Simple   Simple Foot Form Diabetic Foot exam was performed with the following findings: Yes 09/16/2022 12:03 PM  Visual Inspection No deformities, no ulcerations, no other skin breakdown bilaterally: Yes Sensation Testing Intact to touch and monofilament testing bilaterally: Yes Pulse Check Posterior Tibialis and Dorsalis pulse intact bilaterally: Yes Comments Onychomycosis both feet most nails        Assessment and Plan    # Diabetes-new diagnosis 07/09/2022 S: Medication:Diet controlled CBGs- none  Lab Results  Component Value Date   HGBA1C 6.5 07/09/2022   HGBA1C 6.5 07/10/2021   HGBA1C 6.6 (H) 03/10/2021  A/P: diet controlled- monitor at least every 6 months  #Nonobstructive CAD by cath in 2018 with angina thought primarily related to coronary vasospasm #hyperlipidemia-LDL March 2021 at 43 S: Medication:Aspirin 81 mg, atorvastatin 40 mg (reported hot flashes and cramps on rosuvastatin 20 mg), Zetia 10 mg, Imdur 120 mg, metoprolol 25 mg extended release -no chest pain or shortness of breath recently Lab Results  Component Value Date   CHOL 117 07/09/2022   HDL 43.60 07/09/2022   LDLCALC 58 07/09/2022   LDLDIRECT 68 11/29/2020   TRIG 74.0 07/09/2022   CHOLHDL 3 07/09/2022  A/P: CAD nonobstructive and asymptomatic- continue current meds Cholesterol looked great in September- continue current  #Wolff-Parkinson-White-follows with Dr. October: Per cardiology cannot increase metoprolol due to Wolff-Parkinson-White. A/P: stable- continue to monitor   #Heart failure with reduced ejection fraction.  Most recent ejection fraction improved to 55 to 60% in February 2021 S: Medication:Lasix 20 mg daily, also on potassium  Edema: none Weight gain: minimal- up 2 lbs- varies Shortness of breath:  none A/P: Controlled.  Continue current medications.    #hypertension S: medication: Amlodipine 10 mg, Lasix 20 mg, Imdur 120 mg, metoprolol 25 mg extended release BP Readings from Last 3 Encounters:  09/16/22 130/80  08/09/22 132/74  07/09/22 108/64  A/P: Controlled per jnc 8. Continue current medications.     #hyperthyroidism-follows with endocrine Dr.  07/11/22 S: compliant On thyroid medication-methimazole 30 mg in the past-now down to 10 mg BID in sept 2023  A/P: update tsh, t3, t4- missed endocrine appt after her fall- she will reschedule   #Migraines S: Medication:Effexor 150 mg extended release reportedly for headaches for years. A/P: continues to be  helpful- monitor   #Overactive bladder S: Medication: Oxybutynin 10 mg extended  release.  We discussed risks of confusion/falls  A/P: she denies issues- as far as recent fall- states definitely related to knee pain   #back pain- working with pain management on oxycodone-hydrocodone  -reports flare in pain that started after a fall one month ago -fell walking down sidewalk- was using a walking stick and got hit by pain and fell forward -With back pain- I agree with scheduling with DR. Whitfield- we opted to hold off on x-ray since going to see him soon- can also discuss knee pain -she wants to make sure renal function is ok- update with labs -needs to use cane- rx written  #HM_ encouraged GI/colonoscopy follow up - working through insurance issues. Will reach out if she needs help  Recommended follow up: Return for next already scheduled visit or sooner if needed. Future Appointments  Date Time Provider Department Center  11/03/2022  2:00 PM [facility] CCM PHARMACIST [facility]  01/07/2023  1:20 PM Shelva Majestic, MD [facility]    Lab/Order associations:   ICD-10-CM   1. Chronic bilateral low back pain without sciatica  M54.50 DG Lumbar Spine Complete   G89.29 For home use only DME Cane    2. Controlled type 2 diabetes mellitus without  complication, without long-term current use of insulin (HCC)  E11.9 Microalbumin / creatinine urine ratio    CBC with Differential/Platelet    Comprehensive metabolic panel    3. Heart failure with reduced ejection fraction (HCC)  I50.20     4. Primary hypertension  I10 CBC with Differential/Platelet    Comprehensive metabolic panel    5. Hyperthyroidism  E05.90 TSH    T4, free    T3, free      No orders of the defined types were placed in this encounter.   Return precautions advised.  Tana Conch, MD

## 2022-09-16 NOTE — Addendum Note (Signed)
Addended by: Shelva Majestic on: 09/16/2022 12:19 PM   Modules accepted: Orders

## 2022-09-16 NOTE — Patient Instructions (Addendum)
With back pain- I agree with scheduling with DR. Whitfield- we opted to hold off on x-ray since going to see him soon  Schedule with Dr. Lonzo Cloud  When you see your eye doctor- please inform them that you have diabetes- they will do a special exam and have them send [place] a copy  Schedule your bone density test at check out desk.  - located [address] across the street from [place] - in the basement - you DO NEED an appointment for the bone density tests.    Please stop by lab before you go If you have mychart- we will send your results within 3 business days of [place] receiving them.  If you do not have mychart- we will call you about results within 5 business days of [place] receiving them.  *please also note that you will see labs on mychart as soon as they post. I will later go in and write notes on them- will say "notes from Dr. Durene Cal"   Recommended follow up: Return for next already scheduled visit or sooner if needed.

## 2022-09-21 ENCOUNTER — Other Ambulatory Visit: Payer: Self-pay

## 2022-09-21 ENCOUNTER — Inpatient Hospital Stay: Admission: RE | Admit: 2022-09-21 | Payer: Medicare Other | Source: Ambulatory Visit

## 2022-09-21 DIAGNOSIS — D649 Anemia, unspecified: Secondary | ICD-10-CM

## 2022-09-28 ENCOUNTER — Other Ambulatory Visit: Payer: Self-pay

## 2022-09-30 ENCOUNTER — Telehealth: Payer: Self-pay

## 2022-09-30 ENCOUNTER — Ambulatory Visit (INDEPENDENT_AMBULATORY_CARE_PROVIDER_SITE_OTHER): Payer: Medicare Other | Admitting: Family

## 2022-09-30 ENCOUNTER — Other Ambulatory Visit: Payer: Self-pay

## 2022-09-30 ENCOUNTER — Encounter: Payer: Self-pay | Admitting: Family

## 2022-09-30 ENCOUNTER — Other Ambulatory Visit (INDEPENDENT_AMBULATORY_CARE_PROVIDER_SITE_OTHER): Payer: Medicare Other

## 2022-09-30 VITALS — BP 158/79 | HR 88 | Temp 97.7°F | Ht 66.0 in | Wt 145.5 lb

## 2022-09-30 DIAGNOSIS — D649 Anemia, unspecified: Secondary | ICD-10-CM | POA: Diagnosis not present

## 2022-09-30 DIAGNOSIS — I10 Essential (primary) hypertension: Secondary | ICD-10-CM | POA: Diagnosis not present

## 2022-09-30 DIAGNOSIS — N3090 Cystitis, unspecified without hematuria: Secondary | ICD-10-CM | POA: Diagnosis not present

## 2022-09-30 LAB — POCT URINALYSIS DIPSTICK
Bilirubin, UA: NEGATIVE
Glucose, UA: NEGATIVE
Ketones, UA: NEGATIVE
Nitrite, UA: POSITIVE
Protein, UA: POSITIVE — AB
Spec Grav, UA: 1.025 (ref 1.010–1.025)
Urobilinogen, UA: 0.2 E.U./dL
pH, UA: 6 (ref 5.0–8.0)

## 2022-09-30 LAB — CBC WITH DIFFERENTIAL/PLATELET
Basophils Absolute: 0 10*3/uL (ref 0.0–0.1)
Basophils Relative: 0.5 % (ref 0.0–3.0)
Eosinophils Absolute: 0 10*3/uL (ref 0.0–0.7)
Eosinophils Relative: 0.3 % (ref 0.0–5.0)
HCT: 34.8 % — ABNORMAL LOW (ref 36.0–46.0)
Hemoglobin: 11.7 g/dL — ABNORMAL LOW (ref 12.0–15.0)
Lymphocytes Relative: 17.4 % (ref 12.0–46.0)
Lymphs Abs: 1.6 10*3/uL (ref 0.7–4.0)
MCHC: 33.7 g/dL (ref 30.0–36.0)
MCV: 96.5 fl (ref 78.0–100.0)
Monocytes Absolute: 1 10*3/uL (ref 0.1–1.0)
Monocytes Relative: 11 % (ref 3.0–12.0)
Neutro Abs: 6.4 10*3/uL (ref 1.4–7.7)
Neutrophils Relative %: 70.8 % (ref 43.0–77.0)
Platelets: 202 10*3/uL (ref 150.0–400.0)
RBC: 3.61 Mil/uL — ABNORMAL LOW (ref 3.87–5.11)
RDW: 13.9 % (ref 11.5–15.5)
WBC: 9.1 10*3/uL (ref 4.0–10.5)

## 2022-09-30 LAB — BASIC METABOLIC PANEL
BUN: 14 mg/dL (ref 6–23)
CO2: 25 mEq/L (ref 19–32)
Calcium: 8.6 mg/dL (ref 8.4–10.5)
Chloride: 106 mEq/L (ref 96–112)
Creatinine, Ser: 1.14 mg/dL (ref 0.40–1.20)
GFR: 46.96 mL/min — ABNORMAL LOW (ref >60.00)
Glucose, Bld: 98 mg/dL (ref 70–99)
Potassium: 4 mEq/L (ref 3.5–5.1)
Sodium: 138 mEq/L (ref 135–145)

## 2022-09-30 MED ORDER — CEPHALEXIN 500 MG PO CAPS: 500.0000 mg | ORAL_CAPSULE | Freq: Three times a day (TID) | ORAL | 0 refills | Status: DC

## 2022-09-30 MED ORDER — AMLODIPINE BESYLATE 10 MG PO TABS: ORAL_TABLET | ORAL | 0 refills | Status: DC

## 2022-09-30 NOTE — Telephone Encounter (Signed)
Patient seen at PCP office today for labs. States that she has lost/miss placed her AMLODIPINE 10 mg. States that [facility] informed her that she cannot get it filled until 12/26. Asked for PCP to fill another medication, but I informed patient that this medication is managed by cardiologist and that she needs to contact them. Advised that I would send a message to cardiologist to let know what is going on.  Best call back # [phone]

## 2022-09-30 NOTE — Telephone Encounter (Signed)
I have prescribed this in the past- you can fill under my name- in comments state may fill early - patient has misplaced medication. 90 days is fine with 3 refills  -if due for cardiology follow up she can also schedule that

## 2022-09-30 NOTE — Progress Notes (Signed)
Patient ID: [name], female    DOB: [birth year], 76 y.o.   MRN: [id number]  Chief Complaint  Patient presents with   Headache    Pt c/o head pressure, chills,dizziness, Present for about a week and has been getting worse. Pt states she lays down and woke up feeling better. Pt states she has been without BP medication for a week,    HPI:      Dizziness, and head pressure:  has had sx for about a week, also reports chills for 1 day on Monday and fine on Tuesday.  Reports increased urinary frequency, but no burning or foul odor, denies any recent UTI.  Patient is currently maintained on the following medications for blood pressure: Amlodipine, but pt ran out about a week ago.       Assessment & Plan:  1. Primary hypertension -  pt picking up her Amlodipine when she leaves and will restart today. BP elevated today and could be factor in her headache, dizziness. Advised to increase her water intake to 2L per day, eat a low sodium diet. Follow up with PCP as directed.  2. Cystitis - UA positive. Due to GFR down from a few weeks ago, sending Keflex, Advised pt on use & SE, increasing her water intake,  avoid NSAIDs, take BP med as directed.   - POCT Urinalysis Dipstick - Urine Culture - cephALEXin (KEFLEX) 500 MG capsule; Take 1 capsule (500 mg total) by mouth 3 (three) times daily.  Dispense: 21 capsule; Refill: 0   Subjective:    Outpatient Medications Prior to Visit  Medication Sig Dispense Refill   acetaminophen (TYLENOL) 325 MG tablet Take 2 tablets (650 mg total) by mouth 4 (four) times daily. 30 tablet 0   Aloe Vera 25 MG CAPS Take by mouth.     amLODipine (NORVASC) 10 MG tablet TAKE 1 TABLET(10 MG) BY MOUTH DAILY. 30 tablet 0   ASPIRIN LOW DOSE PO Take 81 mg by mouth daily.     atorvastatin (LIPITOR) 40 MG tablet TAKE 1 TABLET(40 MG) BY MOUTH DAILY 90 tablet 3   Cholecalciferol (VITAMIN D3) 25 MCG (1000 UT) CAPS Take by mouth.     Cobalamin Combinations (NEURIVA PLUS) CAPS Take  by mouth.     Cyanocobalamin (VITAMIN B-12) 3000 MCG SUBL Place 3,000 mg under the tongue daily.     esomeprazole (NEXIUM) 20 MG capsule TAKE 1 CAPSULE(20 MG) BY MOUTH DAILY 90 capsule 3   ezetimibe (ZETIA) 10 MG tablet TAKE 1 TABLET(10 MG) BY MOUTH DAILY 90 tablet 3   furosemide (LASIX) 20 MG tablet Take 1 tablet (20 mg total) by mouth daily. 90 tablet 3   isosorbide mononitrate (IMDUR) 120 MG 24 hr tablet Take 1 tablet (120 mg total) by mouth daily. 90 tablet 3   methimazole (TAPAZOLE) 10 MG tablet Take 1 tablet (10 mg total) by mouth 2 (two) times daily. 180 tablet 1   metoprolol succinate (TOPROL-XL) 25 MG 24 hr tablet TAKE 1 TABLET(25 MG) BY MOUTH DAILY 90 tablet 3   nitroGLYCERIN (NITROSTAT) 0.4 MG SL tablet PLACE 1 TABLET UNDER THE TONGUE EVERY 5 MINUTES FOR CHEST PAIN FOR UPTO 3 DOSES 25 tablet 3   oxybutynin (DITROPAN-XL) 10 MG 24 hr tablet TAKE 1 TABLET BY MOUTH EVERY DAY 90 tablet 3   oxyCODONE-acetaminophen (PERCOCET) 10-325 MG tablet as needed. Taking as needed     potassium chloride (MICRO-K) 10 MEQ CR capsule Take 1 capsule (10 mEq total) by mouth 2 (  two) times daily. 180 capsule 3   Prenatal Vit-Fe Fumarate-FA (PRENATAL MULTIVITAMIN) TABS tablet Take 1 tablet by mouth daily at 12 noon.     venlafaxine XR (EFFEXOR-XR) 150 MG 24 hr capsule TAKE 1 CAPSULE BY MOUTH AT BEDTIME. 30 capsule 5   vitamin C (VITAMIN C) 500 MG tablet Take 1 tablet (500 mg total) by mouth 2 (two) times daily. 14 tablet 0   Vitamin E 180 MG CAPS Take 2 capsules by mouth daily.     zinc sulfate 220 (50 Zn) MG capsule Take 1 capsule (220 mg total) by mouth daily. 7 capsule 0   No facility-administered medications prior to visit.   Past Medical History:  Diagnosis Date   Anemia 12/22/2017   Arthritis    Chronic lower back pain    Cluster headache    Coronary artery disease    a. 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 non obs dz LHC 2007. c. Non obs dz (mild-mod) by [facility] 05/17/14 d. cath 05/26/17 mild non obstructive CAD    COVID-19 08/07/2019   Diverticulitis    a. Hx microperf 2012 - hospitalizated.   GERD (gastroesophageal reflux disease)    Hypercholesteremia    Hypertension    Overactive bladder 09/19/2014   Oxybutynin 5mg  XL--> 10mg    PVC's (premature ventricular contractions)    a. Hx of trigeminy/bigeminy.   Seizures (HCC)    Thyroid disease    hyperthyroid   UTI (lower urinary tract infection)    Past Surgical History:  Procedure Laterality Date   ABDOMINAL HYSTERECTOMY  1987   "partial"-still has ovaries   CARDIAC CATHETERIZATION  05/17/2014   CORONARY ANGIOPLASTY WITH STENT PLACEMENT  1995   "1"   JOINT REPLACEMENT     right knee   LEFT HEART CATH AND CORONARY ANGIOGRAPHY N/A 05/26/2017   Procedure: Left Heart Cath and Coronary Angiography;  Surgeon: 05/19/2014, MD;  Location: [facility] INVASIVE CV LAB;  Service: Cardiovascular;  Laterality: N/A;   LEFT HEART CATHETERIZATION WITH CORONARY ANGIOGRAM N/A 05/17/2014   Procedure: LEFT HEART CATHETERIZATION WITH CORONARY ANGIOGRAM;  Surgeon: [facility], MD;  Location: [facility] CATH LAB;  Service: Cardiovascular;  Laterality: N/A;   TOTAL KNEE ARTHROPLASTY Right 2005   TUBAL LIGATION  1970   VASCULAR SURGERY     Allergies  Allergen Reactions   Crestor [Rosuvastatin] Other (See Comments)    Hot flashes and severe cramps   Sulfa Drugs Cross Reactors Shortness Of Breath and Palpitations      Objective:    Physical Exam Vitals and nursing note reviewed.  Constitutional:      Appearance: Normal appearance.  Cardiovascular:     Rate and Rhythm: Normal rate and regular rhythm.  Pulmonary:     Effort: Pulmonary effort is normal.     Breath sounds: Normal breath sounds.  Musculoskeletal:        General: Normal range of motion.  Skin:    General: Skin is warm and dry.  Neurological:     Mental Status: She is alert.  Psychiatric:        Mood and Affect: Mood normal.        Behavior: Behavior normal.    BP (!) 158/79 (BP Location:  Left Arm, Patient Position: Sitting, Cuff Size: Large)   Pulse 88   Temp 97.7 F (36.5 C) (Temporal)   Ht 5\' 6"  (1.676 m)   Wt 145 lb 8 oz (66 kg)   SpO2 99%   BMI 23.48 kg/m  Wt Readings from Last 3 Encounters:  09/30/22 145 lb 8 oz (66 kg)  09/16/22 150 lb 9.6 oz (68.3 kg)  07/09/22 148 lb 6.4 oz (67.3 kg)       [name], NP

## 2022-10-01 DIAGNOSIS — N3090 Cystitis, unspecified without hematuria: Secondary | ICD-10-CM | POA: Diagnosis not present

## 2022-10-01 LAB — PATHOLOGIST SMEAR REVIEW

## 2022-10-04 LAB — URINE CULTURE
MICRO NUMBER:: 14284478
SPECIMEN QUALITY:: ADEQUATE

## 2022-10-05 NOTE — Progress Notes (Signed)
Urine culture positive - I sent in Keflex for her to treat her infection - how is she feeling?

## 2022-10-14 DIAGNOSIS — G43909 Migraine, unspecified, not intractable, without status migrainosus: Secondary | ICD-10-CM | POA: Diagnosis not present

## 2022-10-14 DIAGNOSIS — K219 Gastro-esophageal reflux disease without esophagitis: Secondary | ICD-10-CM | POA: Diagnosis not present

## 2022-10-14 DIAGNOSIS — E0590 Thyrotoxicosis, unspecified without thyrotoxic crisis or storm: Secondary | ICD-10-CM | POA: Diagnosis not present

## 2022-10-14 DIAGNOSIS — I252 Old myocardial infarction: Secondary | ICD-10-CM | POA: Diagnosis not present

## 2022-10-14 DIAGNOSIS — I10 Essential (primary) hypertension: Secondary | ICD-10-CM | POA: Diagnosis not present

## 2022-10-20 ENCOUNTER — Encounter (HOSPITAL_COMMUNITY): Payer: Self-pay

## 2022-10-20 ENCOUNTER — Emergency Department (HOSPITAL_COMMUNITY)
Admission: EM | Admit: 2022-10-20 | Discharge: 2022-10-21 | Disposition: A | Discharge status: Home / Self Care | Payer: Medicare Other | Attending: Emergency Medicine | Admitting: Emergency Medicine

## 2022-10-20 ENCOUNTER — Emergency Department (HOSPITAL_COMMUNITY): Payer: Medicare Other

## 2022-10-20 DIAGNOSIS — Z79899 Other long term (current) drug therapy: Secondary | ICD-10-CM | POA: Diagnosis not present

## 2022-10-20 DIAGNOSIS — Z743 Need for continuous supervision: Secondary | ICD-10-CM | POA: Diagnosis not present

## 2022-10-20 DIAGNOSIS — R531 Weakness: Secondary | ICD-10-CM | POA: Diagnosis not present

## 2022-10-20 DIAGNOSIS — I10 Essential (primary) hypertension: Secondary | ICD-10-CM | POA: Insufficient documentation

## 2022-10-20 DIAGNOSIS — I2510 Atherosclerotic heart disease of native coronary artery without angina pectoris: Secondary | ICD-10-CM | POA: Insufficient documentation

## 2022-10-20 DIAGNOSIS — Z7982 Long term (current) use of aspirin: Secondary | ICD-10-CM | POA: Diagnosis not present

## 2022-10-20 DIAGNOSIS — K529 Noninfective gastroenteritis and colitis, unspecified: Secondary | ICD-10-CM | POA: Insufficient documentation

## 2022-10-20 DIAGNOSIS — K838 Other specified diseases of biliary tract: Secondary | ICD-10-CM | POA: Diagnosis not present

## 2022-10-20 DIAGNOSIS — K6389 Other specified diseases of intestine: Secondary | ICD-10-CM | POA: Diagnosis not present

## 2022-10-20 DIAGNOSIS — Z20822 Contact with and (suspected) exposure to covid-19: Secondary | ICD-10-CM | POA: Insufficient documentation

## 2022-10-20 DIAGNOSIS — N3000 Acute cystitis without hematuria: Secondary | ICD-10-CM | POA: Diagnosis not present

## 2022-10-20 DIAGNOSIS — R1032 Left lower quadrant pain: Secondary | ICD-10-CM | POA: Diagnosis present

## 2022-10-20 LAB — URINALYSIS, ROUTINE W REFLEX MICROSCOPIC
Glucose, UA: NEGATIVE mg/dL
Hgb urine dipstick: NEGATIVE
Ketones, ur: 15 mg/dL — AB
Nitrite: POSITIVE — AB
Protein, ur: 100 mg/dL — AB
Specific Gravity, Urine: 1.025 (ref 1.005–1.030)
WBC, UA: >50 WBC/hpf — ABNORMAL HIGH (ref 0–5)
pH: 6 (ref 5.0–8.0)

## 2022-10-20 LAB — COMPREHENSIVE METABOLIC PANEL
ALT: 18 U/L (ref 0–44)
AST: 20 U/L (ref 15–41)
Albumin: 4.3 g/dL (ref 3.5–5.0)
Alkaline Phosphatase: 71 U/L (ref 38–126)
Anion gap: 10 (ref 5–15)
BUN: 17 mg/dL (ref 8–23)
CO2: 24 mmol/L (ref 22–32)
Calcium: 10.3 mg/dL (ref 8.9–10.3)
Chloride: 106 mmol/L (ref 98–111)
Creatinine, Ser: 0.93 mg/dL (ref 0.44–1.00)
GFR, Estimated: >60 mL/min (ref >60)
Glucose, Bld: 99 mg/dL (ref 70–99)
Potassium: 3.7 mmol/L (ref 3.5–5.1)
Sodium: 140 mmol/L (ref 135–145)
Total Bilirubin: 1.4 mg/dL — ABNORMAL HIGH (ref 0.3–1.2)
Total Protein: 8.9 g/dL — ABNORMAL HIGH (ref 6.5–8.1)

## 2022-10-20 LAB — CBC WITH DIFFERENTIAL/PLATELET
Abs Immature Granulocytes: 0.02 10*3/uL (ref 0.00–0.07)
Basophils Absolute: 0 10*3/uL (ref 0.0–0.1)
Basophils Relative: 0 %
Eosinophils Absolute: 0 10*3/uL (ref 0.0–0.5)
Eosinophils Relative: 0 %
HCT: 42.2 % (ref 36.0–46.0)
Hemoglobin: 13.7 g/dL (ref 12.0–15.0)
Immature Granulocytes: 0 %
Lymphocytes Relative: 26 %
Lymphs Abs: 1.7 10*3/uL (ref 0.7–4.0)
MCH: 31.4 pg (ref 26.0–34.0)
MCHC: 32.5 g/dL (ref 30.0–36.0)
MCV: 96.8 fL (ref 80.0–100.0)
Monocytes Absolute: 0.4 10*3/uL (ref 0.1–1.0)
Monocytes Relative: 7 %
Neutro Abs: 4.3 10*3/uL (ref 1.7–7.7)
Neutrophils Relative %: 67 %
Platelets: 266 10*3/uL (ref 150–400)
RBC: 4.36 MIL/uL (ref 3.87–5.11)
RDW: 13.9 % (ref 11.5–15.5)
WBC: 6.5 10*3/uL (ref 4.0–10.5)
nRBC: 0 % (ref 0.0–0.2)

## 2022-10-20 LAB — LIPASE, BLOOD: Lipase: 35 U/L (ref 11–51)

## 2022-10-20 LAB — RESP PANEL BY RT-PCR (RSV, FLU A&B, COVID)  RVPGX2
Influenza A by PCR: NEGATIVE
Influenza B by PCR: NEGATIVE
Resp Syncytial Virus by PCR: NEGATIVE
SARS Coronavirus 2 by RT PCR: NEGATIVE

## 2022-10-20 MED ORDER — IOHEXOL 300 MG/ML  SOLN
100.0000 mL | Freq: Once | INTRAMUSCULAR | Status: AC | PRN
  Administered 2022-10-20: 100 mL via INTRAVENOUS

## 2022-10-20 NOTE — ED Provider Triage Note (Signed)
Emergency Medicine Provider Triage Evaluation Note  [name] , a 76 y.o. female  was evaluated in triage.  Pt complains of weak diarrhea and abdominal pain.  She had onset of diarrhea starting yesterday after eating crackers and taking her medicine.  She states that she started having diarrhea and then having blood and diarrhea.  She said that her pain and cramping were so bad yesterday that she made a pallet on the floor to stay right by the toilet.  This morning she is no longer having any true stools she is just passing liquid mucus and blood.  She continues to have significant pain in her lower abdomen.  She is not on any blood thinners. She denies contacts with similar sxs, ingestion of suspect foods or water, history of similar sxs, recent foreign travel . Marland Kitchen  Review of Systems  Positive: Nominal pain and diarrhea Negative: Fever  Physical Exam  BP 110/75   Pulse 98   Temp 99.3 F (37.4 C) (Oral)   Resp 16   SpO2 100%  Gen:   Awake, no distress   Resp:  Normal effort  MSK:   Moves extremities without difficulty  Other:    Medical Decision Making  Medically screening exam initiated at 3:40 PM.  Appropriate orders placed.  Janann Boeve was informed that the remainder of the evaluation will be completed by another provider, this initial triage assessment does not replace that evaluation, and the importance of remaining in the ED until their evaluation is complete.  Ttp abd lower    Arthor Captain, PA-C 10/20/22 1546

## 2022-10-20 NOTE — ED Notes (Signed)
I called patient to recheck vital signs and no one respnded

## 2022-10-20 NOTE — ED Notes (Signed)
I called patient to recheck vital signs and no one responded 

## 2022-10-20 NOTE — ED Notes (Signed)
I called patient to recheck vitals and no one responded 

## 2022-10-20 NOTE — ED Triage Notes (Signed)
Pt arrived via EMS,  from home, c/o abd pain, and bloodly stools.

## 2022-10-21 MED ORDER — CEPHALEXIN 500 MG PO CAPS: 500.0000 mg | ORAL_CAPSULE | Freq: Two times a day (BID) | ORAL | 0 refills | Status: AC

## 2022-10-21 MED ORDER — ONDANSETRON HCL 4 MG PO TABS: 4.0000 mg | ORAL_TABLET | Freq: Four times a day (QID) | ORAL | 0 refills | Status: DC

## 2022-10-21 MED ORDER — HYDROMORPHONE HCL 1 MG/ML IJ SOLN
0.5000 mg | Freq: Once | INTRAMUSCULAR | Status: AC
  Administered 2022-10-21: 0.5 mg via INTRAVENOUS
  Filled 2022-10-21: qty 1

## 2022-10-21 MED ORDER — SODIUM CHLORIDE 0.9 % IV BOLUS
500.0000 mL | Freq: Once | INTRAVENOUS | Status: AC
  Administered 2022-10-21: 500 mL via INTRAVENOUS

## 2022-10-21 MED ORDER — ONDANSETRON HCL 4 MG/2ML IJ SOLN
4.0000 mg | Freq: Once | INTRAMUSCULAR | Status: AC
  Administered 2022-10-21: 4 mg via INTRAVENOUS
  Filled 2022-10-21: qty 2

## 2022-10-21 MED ORDER — DICYCLOMINE HCL 20 MG PO TABS: 20.0000 mg | ORAL_TABLET | Freq: Every day | ORAL | 0 refills | Status: AC | PRN

## 2022-10-21 NOTE — ED Provider Notes (Signed)
[facility] DEPT Provider Note   CSN: [id number] Arrival date & time: 10/20/22  1241     History  No chief complaint on file.   [name] is a 76 y.o. female.  HPI   Medical history including CAD, diverticulitis, hypertension, GERD, presents with complaints of left lower quadrant tenderness.  Patient states it started on Saturday, states pain remains in this area, does not radiate, she had associated nausea vomiting diarrhea, she states that she did have some blood mixed with her stool, this has since resolved, she states she only has slight abdominal tenderness, she states she has a history of diverticulitis and is concerned for this.  She denies any urinary symptoms, no flank tenderness no back pain, she has no fevers or chills, she is not on anticoag's, she has no history of Crohn's or UC.     Home Medications Prior to Admission medications   Medication Sig Start Date End Date Taking? Authorizing Provider  cephALEXin (KEFLEX) 500 MG capsule Take 1 capsule (500 mg total) by mouth 2 (two) times daily for 7 days. 10/21/22 10/28/22 Yes Marcello Fennel, PA-C  dicyclomine (BENTYL) 20 MG tablet Take 1 tablet (20 mg total) by mouth daily as needed for spasms. 10/21/22  Yes Marcello Fennel, PA-C  ondansetron (ZOFRAN) 4 MG tablet Take 1 tablet (4 mg total) by mouth every 6 (six) hours. 10/21/22  Yes Marcello Fennel, PA-C  acetaminophen (TYLENOL) 325 MG tablet Take 2 tablets (650 mg total) by mouth 4 (four) times daily. 06/07/22   Mesner, Corene Cornea, MD  Aloe Vera 25 MG CAPS Take by mouth.    [provider]  amLODipine (NORVASC) 10 MG tablet TAKE 1 TABLET(10 MG) BY MOUTH DAILY. 09/30/22   Marin Olp, MD  ASPIRIN LOW DOSE PO Take 81 mg by mouth daily.    [provider]  atorvastatin (LIPITOR) 40 MG tablet TAKE 1 TABLET(40 MG) BY MOUTH DAILY 01/06/22   Elgie Collard, PA-C  Cholecalciferol (VITAMIN D3) 25 MCG (1000 UT) CAPS Take by  mouth.    [provider]  Cobalamin Combinations (NEURIVA PLUS) CAPS Take by mouth.    [provider]  Cyanocobalamin (VITAMIN B-12) 3000 MCG SUBL Place 3,000 mg under the tongue daily.    [provider]  esomeprazole (NEXIUM) 20 MG capsule TAKE 1 CAPSULE(20 MG) BY MOUTH DAILY 07/14/22   Marin Olp, MD  ezetimibe (ZETIA) 10 MG tablet TAKE 1 TABLET(10 MG) BY MOUTH DAILY 01/06/22   Elgie Collard, PA-C  furosemide (LASIX) 20 MG tablet Take 1 tablet (20 mg total) by mouth daily. 01/06/22   Elgie Collard, PA-C  isosorbide mononitrate (IMDUR) 120 MG 24 hr tablet Take 1 tablet (120 mg total) by mouth daily. 01/06/22   Elgie Collard, PA-C  methimazole (TAPAZOLE) 10 MG tablet Take 1 tablet (10 mg total) by mouth 2 (two) times daily. 04/22/22   Shamleffer, Melanie Crazier, MD  metoprolol succinate (TOPROL-XL) 25 MG 24 hr tablet TAKE 1 TABLET(25 MG) BY MOUTH DAILY 01/06/22   Elgie Collard, PA-C  nitroGLYCERIN (NITROSTAT) 0.4 MG SL tablet PLACE 1 TABLET UNDER THE TONGUE EVERY 5 MINUTES FOR CHEST PAIN FOR UPTO 3 DOSES 01/06/22   Elgie Collard, PA-C  oxybutynin (DITROPAN-XL) 10 MG 24 hr tablet TAKE 1 TABLET BY MOUTH EVERY DAY 05/07/22   Marin Olp, MD  oxyCODONE-acetaminophen (PERCOCET) 10-325 MG tablet as needed. Taking as needed 12/19/21   [provider]  potassium chloride (MICRO-K) 10 MEQ CR capsule Take 1 capsule (10 mEq total) by mouth 2 (two) times daily. 01/06/22   Conte, Tessa N, PA-C  Prenatal Vit-Fe Fumarate-FA (PRENATAL MULTIVITAMIN) TABS tablet Take 1 tablet by mouth daily at 12 noon.    [provider]  venlafaxine XR (EFFEXOR-XR) 150 MG 24 hr capsule TAKE 1 CAPSULE BY MOUTH AT BEDTIME. 05/07/22   Hunter, Stephen O, MD  vitamin C (VITAMIN C) 500 MG tablet Take 1 tablet (500 mg total) by mouth 2 (two) times daily. 08/08/19   Adhikari, Amrit, MD  Vitamin E 180 MG CAPS Take 2 capsules by mouth daily.    [provider]  zinc sulfate 220  (50 Zn) MG capsule Take 1 capsule (220 mg total) by mouth daily. 08/09/19   Adhikari, Amrit, MD      Allergies    Crestor [rosuvastatin] and Sulfa drugs cross reactors    Review of Systems   Review of Systems  Constitutional:  Negative for chills and fever.  Respiratory:  Negative for shortness of breath.   Cardiovascular:  Negative for chest pain.  Gastrointestinal:  Positive for abdominal pain, diarrhea, nausea and vomiting.  Neurological:  Negative for headaches.    Physical Exam Updated Vital Signs BP 136/87   Pulse 71   Temp 98.7 F (37.1 C)   Resp 16   SpO2 100%  Physical Exam Vitals and nursing note reviewed.  Constitutional:      General: She is not in acute distress.    Appearance: She is not ill-appearing.  HENT:     Head: Normocephalic and atraumatic.     Nose: No congestion.  Eyes:     Conjunctiva/sclera: Conjunctivae normal.  Cardiovascular:     Rate and Rhythm: Normal rate and regular rhythm.     Pulses: Normal pulses.     Heart sounds: No murmur heard.    No friction rub. No gallop.  Pulmonary:     Effort: No respiratory distress.     Breath sounds: No wheezing, rhonchi or rales.  Abdominal:     Palpations: Abdomen is soft.     Tenderness: There is abdominal tenderness. There is no right CVA tenderness or left CVA tenderness.     Comments: Abdomen nondistended, soft, she has slight tenderness in the left lower quadrant with guarding potential peritoneal sign.  Musculoskeletal:     Right lower leg: No edema.     Left lower leg: No edema.  Skin:    General: Skin is warm and dry.  Neurological:     Mental Status: She is alert.  Psychiatric:        Mood and Affect: Mood normal.     ED Results / Procedures / Treatments   Labs (all labs ordered are listed, but only abnormal results are displayed) Labs Reviewed  COMPREHENSIVE METABOLIC PANEL - Abnormal; Notable for the following components:      Result Value   Total Protein 8.9 (*)    Total  Bilirubin 1.4 (*)    All other components within normal limits  URINALYSIS, ROUTINE W REFLEX MICROSCOPIC - Abnormal; Notable for the following components:   Bilirubin Urine SMALL (*)    Ketones, ur 15 (*)    Protein, ur 100 (*)    Nitrite POSITIVE (*)    Leukocytes,Ua TRACE (*)    WBC, UA >50 (*)    Bacteria, UA MANY (*)    All other components within normal limits  RESP PANEL BY   RT-PCR (RSV, FLU A&B, COVID)  [id number]  URINE CULTURE  CBC WITH DIFFERENTIAL/PLATELET  LIPASE, BLOOD    EKG None  Radiology CT Abdomen Pelvis W Contrast  Result Date: 10/20/2022 CLINICAL DATA:  Acute abdominal pain with bloody stools, initial encounter EXAM: CT ABDOMEN AND PELVIS WITH CONTRAST TECHNIQUE: Multidetector CT imaging of the abdomen and pelvis was performed using the standard protocol following bolus administration of intravenous contrast. RADIATION DOSE REDUCTION: This exam was performed according to the departmental dose-optimization program which includes automated exposure control, adjustment of the mA and/or kV according to patient size and/or use of iterative reconstruction technique. CONTRAST:  100mL OMNIPAQUE IOHEXOL 300 MG/ML  SOLN COMPARISON:  12/14/2021 FINDINGS: Lower chest: No acute abnormality. Hepatobiliary: Small less than 1 cm hypodensity is again seen in the right lobe of the liver likely representing a cyst. Gallbladder is well distended. Minimal fullness of the intrahepatic biliary ductal radicles is seen stable in appearance from the prior exam. No common bile duct stone is seen. Pancreas: Unremarkable. No pancreatic ductal dilatation or surrounding inflammatory changes. Spleen: Normal in size without focal abnormality. Adrenals/Urinary Tract: Adrenal glands are within normal limits. Kidneys are well visualized. No renal calculi or obstructive changes are seen. The bladder is decompressed. Stomach/Bowel: Mild wall thickening is noted in the sigmoid colon suggestive of colitis. No  evidence of perforation or abscess formation is seen. Only minimal diverticular change is noted. More proximal colon is within normal limits. The appendix is unremarkable. Stomach and small bowel are within normal limits. Vascular/Lymphatic: Aortic atherosclerosis. No enlarged abdominal or pelvic lymph nodes. Reproductive: Status post hysterectomy. No adnexal masses. Other: No abdominal wall hernia or abnormality. No abdominopelvic ascites. Musculoskeletal: No acute or significant osseous findings. IMPRESSION: Wall thickening within the sigmoid colon consistent with focal colitis. No evidence of perforation is noted. Stable biliary ductal dilatation without focal mass or stone. This is stable dating back to 2020. Electronically Signed   By: Mark  Lukens M.D.   On: 10/20/2022 17:58    Procedures Procedures    Medications Ordered in ED Medications  iohexol (OMNIPAQUE) 300 MG/ML solution 100 mL (100 mLs Intravenous Contrast Given 10/20/22 1735)  sodium chloride 0.9 % bolus 500 mL (500 mLs Intravenous New Bag/Given 10/21/22 0541)  HYDROmorphone (DILAUDID) injection 0.5 mg (0.5 mg Intravenous Given 10/21/22 0538)  ondansetron (ZOFRAN) injection 4 mg (4 mg Intravenous Given 10/21/22 0538)    ED Course/ Medical Decision Making/ A&P                           Medical Decision Making Amount and/or Complexity of Data Reviewed Labs: ordered.  Risk Prescription drug management.   This patient presents to the ED for concern of abdominal pain, this involves an extensive number of treatment options, and is a complaint that carries with it a high risk of complications and morbidity.  The differential diagnosis includes diverticulitis, volvulus, bowel obstruction,    Additional history obtained:  Additional history obtained from family at bedside External records from outside source obtained and reviewed including PCP notes   Co morbidities that complicate the patient evaluation  N/A  Social  Determinants of Health:  N/A    Lab Tests:  I Ordered, and personally interpreted labs.  The pertinent results include: CBC is unremarkable, CMP shows slight elevation T. bili 1.4, lipase is 35 UA shows trace leukocytes positive nitrates white blood cells many bacteria   Imaging Studies ordered:  I ordered imaging   studies including CT AP I independently visualized and interpreted imaging which showed shows small segment of colitis I agree with the radiologist interpretation   Cardiac Monitoring:  The patient was maintained on a cardiac monitor.  I personally viewed and interpreted the cardiac monitored which showed an underlying rhythm of: n/a   Medicines ordered and prescription drug management:  I ordered medication including fluids, pain medication, antibiotics I have reviewed the patients home medicines and have made adjustments as needed  Critical Interventions:  N/A   Reevaluation:  Presents with lower abdominal pain, treated obtain basic lab work imaging which I personally reviewed UA consistent UTI CT imaging reveals colitis, will provide with pain medication fluids, antiemetics p.o. challenge  Reassessed resting comfortably, abdomen soft nontender, she is tolerating p.o., she is agreement discharge at this time.    Consultations Obtained:  N/a    Test Considered:  N/a    Rule out low suspicion for lower lobe pneumonia as lung sounds are clear bilaterally, will defer imaging at this time.  I have low suspicion for liver or gallbladder abnormality as she has no right upper quadrant tenderness, liver enzymes, alk phos, all with normal limits, T. bili slightly elevated but suspect this is more transient.  Low suspicion for pancreatitis as lipase is within normal limits.  Low suspicion for ruptured stomach ulcer as she has no peritoneal sign present on exam.  Suspicion for diverticulitis, volvulus, bowel obstruction, intra-abdominal abscess, AAA, pyelo-,  kidney stone is low as CT imaging is negative.    Dispostion and problem list  After consideration of the diagnostic results and the patients response to treatment, I feel that the patent would benefit from discharge.  UTI-antibiotics, will recommend symptom management, follow-up with PCP for further evaluation Colitis-likely this is viral, will not start on antibiotics and she is afebrile, nontachycardic, no leukocytosis, bloody stools since resolved, will recommend bland diet, follow-up with PCP as needed            Final Clinical Impression(s) / ED Diagnoses Final diagnoses:  Colitis  Acute cystitis without hematuria    Rx / DC Orders ED Discharge Orders          Ordered    cephALEXin (KEFLEX) 500 MG capsule  2 times daily        10/21/22 0602    ondansetron (ZOFRAN) 4 MG tablet  Every 6 hours        10/21/22 0602    dicyclomine (BENTYL) 20 MG tablet  Daily PRN        10/21/22 0603              Marcello Fennel, PA-C 10/21/22 0174    Maudie Flakes, MD 10/21/22 [phone]

## 2022-10-21 NOTE — Discharge Instructions (Signed)
Colitis-you have some inflammation on your lower intestines, I recommend a bland diet, given you Bentyl you may use as needed for stomach spasms, avulsing the Zofran for nausea.  You may follow-up with your PCP UTI-started on antibiotics please take as prescribed, may use over-the-counter pain medication as needed  Come back to the emergency department if you develop chest pain, shortness of breath, severe abdominal pain, uncontrolled nausea, vomiting, diarrhea.

## 2022-10-23 LAB — URINE CULTURE: Culture: >=100000 — AB

## 2022-10-30 NOTE — Progress Notes (Deleted)
e  Care Management & Coordination Services Pharmacy Note  10/30/2022 Name:  [name] MRN:  [id number] DOB:  April 26, 1946  Summary: ***  Recommendations/Changes made from today's visit: ***  Follow up plan: ***   Subjective: [name] is an 77 y.o. year old female who is a primary patient of Durene Cal, Aldine Contes, MD.  The care coordination team was consulted for assistance with disease management and care coordination needs.    {CCMTELEPHONEFACETOFACE:21091510} for {CCMINITIALFOLLOWUPCHOICE:21091511}.  Patient Care Team: Shelva Majestic, MD as PCP - General (Family Medicine) Lyn Records, MD as PCP - Cardiology (Cardiology) Erroll Luna, [facility] (Pharmacist)  Recent office visits: ***  Recent consult visits: [facility] visits: {Hospital DC Yes/No:25215}   Objective:  Lab Results  Component Value Date   CREATININE 0.93 10/20/2022   BUN 17 10/20/2022   GFR 46.96 (L) 09/30/2022   GFRNONAA >60 10/20/2022   GFRAA 56 (L) 02/04/2020   NA 140 10/20/2022   K 3.7 10/20/2022   CALCIUM 10.3 10/20/2022   CO2 24 10/20/2022   GLUCOSE 99 10/20/2022    Lab Results  Component Value Date/Time   HGBA1C 6.5 07/09/2022 01:55 PM   HGBA1C 6.5 07/10/2021 02:28 PM   GFR 46.96 (L) 09/30/2022 12:58 PM   GFR 55.63 (L) 09/16/2022 12:27 PM   MICROALBUR 1.6 09/16/2022 12:27 PM    Last diabetic Eye exam: No results found for: "HMDIABEYEEXA"  Last diabetic Foot exam: No results found for: "HMDIABFOOTEX"   Lab Results  Component Value Date   CHOL 117 07/09/2022   HDL 43.60 07/09/2022   LDLCALC 58 07/09/2022   LDLDIRECT 68 11/29/2020   TRIG 74.0 07/09/2022   CHOLHDL 3 07/09/2022       Latest Ref Rng & Units 10/20/2022    4:22 PM 09/16/2022   12:27 PM 07/09/2022    1:55 PM  Hepatic Function  Total Protein 6.5 - 8.1 g/dL 8.9  6.8  7.6   Albumin 3.5 - 5.0 g/dL 4.3  3.9  4.0   AST 15 - 41 U/L 20  19  20    ALT 0 - 44 U/L 18  16  17    Alk Phosphatase 38 - 126 U/L 71  66   75   Total Bilirubin 0.3 - 1.2 mg/dL 1.4  0.7  0.6     Lab Results  Component Value Date/Time   TSH 0.39 09/16/2022 12:27 PM   TSH 4.51 07/09/2022 01:55 PM   FREET4 0.74 09/16/2022 12:27 PM   FREET4 1.08 04/22/2022 08:26 AM       Latest Ref Rng & Units 10/20/2022    4:22 PM 09/30/2022   12:58 PM 09/16/2022   12:27 PM  CBC  WBC 4.0 - 10.5 K/uL 6.5  9.1  3.4   Hemoglobin 12.0 - 15.0 g/dL 14/03/2022  09/18/2022  48.1   Hematocrit 36.0 - 46.0 % 42.2  34.8  33.6   Platelets 150 - 400 K/uL 266  202.0  183.0     Lab Results  Component Value Date/Time   VITAMINB12 547 04/03/2021 02:24 PM    Clinical ASCVD: {YES/NO:21197} The ASCVD Risk score (Arnett DK, et al., 2019) failed to calculate for the following reasons:   The patient has a prior MI or stroke diagnosis       05/12/2022   10:04 AM 02/06/2021    2:13 PM 11/29/2020    1:49 PM  Depression screen PHQ 2/9  Decreased Interest 0 0 0  Down, Depressed, Hopeless  1 0 0  PHQ - 2 Score 1 0 0     ***Other: (CHADS2VASc if Afib, MMRC or CAT for COPD, ACT, DEXA)  Social History   Tobacco Use  Smoking Status Never  Smokeless Tobacco Never   BP Readings from Last 3 Encounters:  10/21/22 (!) 145/93  09/30/22 (!) 158/79  09/16/22 130/80   Pulse Readings from Last 3 Encounters:  10/21/22 75  09/30/22 88  09/16/22 61   Wt Readings from Last 3 Encounters:  09/30/22 145 lb 8 oz (66 kg)  09/16/22 150 lb 9.6 oz (68.3 kg)  07/09/22 148 lb 6.4 oz (67.3 kg)   BMI Readings from Last 3 Encounters:  09/30/22 23.48 kg/m  09/16/22 24.31 kg/m  07/09/22 23.95 kg/m    Allergies  Allergen Reactions   Crestor [Rosuvastatin] Other (See Comments)    Hot flashes and severe cramps   Sulfa Drugs Cross Reactors Shortness Of Breath and Palpitations    Medications Reviewed Today     Reviewed by Verne Grain, CMA (Certified Medical Assistant) on 09/30/22 at 1639  Med List Status: <None>   Medication Order Taking? Sig Documenting Provider  Last Dose Status Informant  acetaminophen (TYLENOL) 325 MG tablet [id number] Yes Take 2 tablets (650 mg total) by mouth 4 (four) times daily. Mesner, Corene Cornea, MD Taking Active   Aloe Vera 25 [place] [id number] Yes Take by mouth. [provider] Taking Active   amLODipine (NORVASC) 10 MG tablet [id number] Yes TAKE 1 TABLET(10 MG) BY MOUTH DAILY. Marin Olp, MD Taking Active   ASPIRIN LOW DOSE PO [id number] Yes Take 81 mg by mouth daily. [provider] Taking Active Self  atorvastatin (LIPITOR) 40 MG tablet [id number] Yes TAKE 1 TABLET(40 MG) BY MOUTH DAILY Harriet Pho, Tessa N, PA-C Taking Active   Cholecalciferol (VITAMIN D3) 25 MCG (1000 UT) CAPS [id number] Yes Take by mouth. [provider] Taking Active   Cobalamin Combinations ([place]) CAPS [id number] Yes Take by mouth. [provider] Taking Active   Cyanocobalamin (VITAMIN B-12) 3000 MCG SUBL [id number] Yes Place 3,000 mg under the tongue daily. [provider] Taking Active   esomeprazole (NEXIUM) 20 MG capsule [id number] Yes TAKE 1 CAPSULE(20 MG) BY MOUTH DAILY Marin Olp, MD Taking Active   ezetimibe (ZETIA) 10 MG tablet [id number] Yes TAKE 1 TABLET(10 MG) BY MOUTH DAILY Elgie Collard, PA-C Taking Active   furosemide (LASIX) 20 MG tablet [id number] Yes Take 1 tablet (20 mg total) by mouth daily. Elgie Collard, PA-C Taking Active   isosorbide mononitrate (IMDUR) 120 MG 24 hr tablet [id number] Yes Take 1 tablet (120 mg total) by mouth daily. Elgie Collard, PA-C Taking Active   methimazole (TAPAZOLE) 10 MG tablet [id number] Yes Take 1 tablet (10 mg total) by mouth 2 (two) times daily. Shamleffer, Melanie Crazier, MD Taking Active   metoprolol succinate (TOPROL-XL) 25 MG 24 hr tablet [id number] Yes TAKE 1 TABLET(25 MG) BY MOUTH DAILY Harriet Pho, Tessa N, PA-C Taking Active   nitroGLYCERIN (NITROSTAT) 0.4 MG SL tablet [id number] Yes PLACE 1 TABLET UNDER THE TONGUE EVERY 5 MINUTES FOR CHEST PAIN FOR UPTO 3  DOSES Elgie Collard, PA-C Taking Active   oxybutynin (DITROPAN-XL) 10 MG 24 hr tablet [id number] Yes TAKE 1 TABLET BY MOUTH EVERY DAY Marin Olp, MD Taking Active   oxyCODONE-acetaminophen (PERCOCET) 10-325 MG tablet [id number] Yes as needed. Taking as needed [provider] Taking Active   potassium chloride (MICRO-K) 10 MEQ CR capsule [id number]  Yes Take 1 capsule (10 mEq total) by mouth 2 (two) times daily. Elgie Collard, PA-C Taking Active   Prenatal Vit-Fe Fumarate-FA (PRENATAL MULTIVITAMIN) TABS tablet [id number] Yes Take 1 tablet by mouth daily at 12 noon. [provider] Taking Active Self  venlafaxine XR (EFFEXOR-XR) 150 MG 24 hr capsule [id number] Yes TAKE 1 CAPSULE BY MOUTH AT BEDTIME. Marin Olp, MD Taking Active   vitamin C (VITAMIN C) 500 MG tablet [id number] Yes Take 1 tablet (500 mg total) by mouth 2 (two) times daily. Shelly Coss, MD Taking Active Self  Vitamin E 180 MG CAPS [id number] Yes Take 2 capsules by mouth daily. [provider] Taking Active Self  zinc sulfate 220 (50 Zn) MG capsule [id number] Yes Take 1 capsule (220 mg total) by mouth daily. Shelly Coss, MD Taking Active Self            Patient Active Problem List   Diagnosis Date Noted   Pain in left wrist 07/17/2021   WPW (Wolff-Parkinson-White syndrome) 03/05/2020   Chest pain 02/03/2020   Aortic atherosclerosis ([place]) 01/09/2020   Heart failure with reduced ejection fraction ([place]) 08/21/2019   COVID-19 08/07/2019   Anemia 12/22/2017   Hyperthyroidism 05/22/2017   Overactive bladder 09/19/2014   Migraine 09/19/2014   Diabetes mellitus type II, controlled ([place]) 09/19/2014   Hypokalemia 06/01/2014   Coronary artery disease involving native coronary artery of native heart with angina pectoris (HCC)    Seizures ([place])    Arthritis    Hypercholesteremia    GERD (gastroesophageal reflux disease) 10/04/2011   HTN (hypertension) 10/01/2011    Immunization History   Administered Date(s) Administered   Influenza, High Dose Seasonal PF 08/17/2016, 08/06/2017, 07/09/2022   Influenza,inj,Quad PF,6+ Mos 10/11/2015   Janssen (J&J) SARS-COV-2 Vaccination 03/10/2020   Pneumococcal Conjugate-13 10/11/2015   Pneumococcal Polysaccharide-23 11/19/2016   Td 12/25/1995     Compliance/Adherence/Medication fill history: Care Gaps: ***  Star-Rating Drugs: ***  SDOH:  (Social Determinants of Health) assessments and interventions performed: {yes/no:20286} SDOH Interventions    Flowsheet Row Clinical Support from 01/09/2020 in [place] Most recent reading at 01/09/2020  4:02 PM Office Visit from 01/09/2020 in [place] Most recent reading at 01/09/2020  1:05 PM  SDOH Interventions    Depression Interventions/Treatment  Currently on Treatment Counseling      SDOH Screenings   Food Insecurity: No Food Insecurity (05/12/2022)  Housing: Low Risk  (05/12/2022)  Transportation Needs: No Transportation Needs (05/12/2022)  Depression (PHQ2-9): Low Risk  (05/12/2022)  Financial Resource Strain: Low Risk  (05/12/2022)  Physical Activity: Insufficiently Active (05/12/2022)  Social Connections: Moderately Isolated (05/12/2022)  Stress: No Stress Concern Present (05/12/2022)  Tobacco Use: Low Risk  (10/20/2022)    Medication Assistance: {MEDASSISTANCEINFO:25044}  Medication Access: Within the past 30 days, how often has patient missed a dose of medication? *** Is a pillbox or other method used to improve adherence? {YES/NO:21197} Factors that may affect medication adherence? {CHL DESC; BARRIERS:21522} Are meds synced by current pharmacy? {YES/NO:21197} Are meds delivered by current pharmacy? {YES/NO:21197} Does patient experience delays in picking up medications due to transportation concerns? {YES/NO:21197}  [facility] Reviewed: Is patient disadvantaged to use [facility]?: {YES/NO:21197} Current Rx  insurance plan: *** Name and location of Current pharmacy:  [facility] [place], [place] - [address] [place] [address] Phone: [phone] Fax: [phone]  [facility] services  reviewed with patient today?: {YES/NO:21197} Patient requests to transfer care to [facility]?: {YES/NO:21197} Reason patient declined to change pharmacies: {US patient preference:27474}   Assessment/Plan                   Hypertension (BP goal <130/80) -Controlled, not assessed today -Current treatment: Amlodipine 10mg  daily Isosorbide 120mg  24hr daily Metoprolol Xl 25mg  daily -Medications previously tried: propronolol  -Current home readings: not checking -Current exercise habits: very active around the house, "always moving" no organized physical activity -Reports hypotensive/hypertensive symptoms - dizziness occasionally at random times -Educated on BP goals and benefits of medications for prevention of heart attack, stroke and kidney damage; Importance of home blood pressure monitoring; Symptoms of hypotension and importance of maintaining adequate hydration; -Counseled to monitor BP at home as able, document, and provide log at future appointments -Recommended to continue current medication Report dizziness to providers  Hyperlipidemia: (LDL goal < 70) -Controlled, not assessed today -Current treatment: Atorvastatin 40mg  Zetia 10mg  daily -Medications previously tried: Rosuvastatin (cramps)  -Educated on Cholesterol goals;  Benefits of statin for ASCVD risk reduction; Importance of limiting foods high in cholesterol; Most recent LDL is controlled -Recommended to continue current medication  Heart Failure (Goal: manage symptoms and prevent exacerbations) -Controlled, not assessed today -Last ejection fraction: 55-60%  -Current treatment: Furosemide 20mg  daily Metoprolol XL 25mg   daily Imdur 120mg  24hr daily -Medications previously tried: none noted  -Current home BP/HR readings: not checking, normal in office -Educated on Benefits of medications for managing symptoms and prolonging life Importance of weighing daily; if you gain more than 3 pounds in one day or 5 pounds in one week, contact providers Proper diuretic administration and potassium supplementation -Recommended to continue current medication  GERD (Goal: Control symptoms) 05/04/22 -Not ideally controlled -Current treatment  Esomeprazole 20mg  daily Appropriate, Query effective,  -Medications previously tried: none noted -Did switch to morning dosing, still having to take the gas pills.  Mentions her symptoms have improved somewhat. She never completed DEXA scan, would recommend due to long term PPI use and age. -Recommended to continue current medication, work on trigger foods for GERD.  Initial Visit Recommend she take medication in the am on empty stomach separate from other meds.  See if this helps symptoms.  Hyperthyroidism (Goal: Maintain TSH) 05/04/22 -Controlled -Current treatment  Methimazole 10mg  Appropriate, Query effective, -Medications previously tried: none noted -History of dosing is a little unclear.  Today, she tells me that she is taking two tablets twice daily for a total of 40mg  per day.  Based on the telephone note from 6/28, she was supposed to be taking one tablet twice daily for total of 20mg  daily.  TSH was very low at last labs. She does not return for follow up until September.  I discussed proper dosing with endocrine.  We are in agreement that 40mg  daily will be too much. Will update the patient on proper directions of 20mg  daily.  10mg  am and 10mg  pm.  Recheck TSH when she goes for FU visit. No other changes to medication at this time.    Anemia (Goal: Maintain Hgb) -Controlled, not assessed today -Current treatment  None noted -Medications previously tried: none  noted  -Last Hgb is normal at 12.0 -Recommended to continue current medication  Patient Goals/Self-Care Activities Patient will:  - take medications as prescribed focus on medication adherence by pill counts check blood pressure periodically, document, and provide at future appointments  Follow Up Plan: The care management team will  reach out to the patient again over the next 180 days.            Willa Frater, PharmD Clinical Pharmacist  [facility] [phone]

## 2022-11-02 ENCOUNTER — Ambulatory Visit (INDEPENDENT_AMBULATORY_CARE_PROVIDER_SITE_OTHER): Payer: Medicare Other | Admitting: Family Medicine

## 2022-11-02 ENCOUNTER — Encounter: Payer: Self-pay | Admitting: Family Medicine

## 2022-11-02 VITALS — BP 104/72 | HR 81 | Temp 97.4°F | Ht 66.0 in | Wt 141.4 lb

## 2022-11-02 DIAGNOSIS — I25119 Atherosclerotic heart disease of native coronary artery with unspecified angina pectoris: Secondary | ICD-10-CM

## 2022-11-02 DIAGNOSIS — E119 Type 2 diabetes mellitus without complications: Secondary | ICD-10-CM | POA: Diagnosis not present

## 2022-11-02 DIAGNOSIS — R1030 Lower abdominal pain, unspecified: Secondary | ICD-10-CM | POA: Diagnosis not present

## 2022-11-02 DIAGNOSIS — I700 Atherosclerosis of aorta: Secondary | ICD-10-CM | POA: Diagnosis not present

## 2022-11-02 DIAGNOSIS — K529 Noninfective gastroenteritis and colitis, unspecified: Secondary | ICD-10-CM | POA: Diagnosis not present

## 2022-11-02 DIAGNOSIS — R569 Unspecified convulsions: Secondary | ICD-10-CM

## 2022-11-02 NOTE — Progress Notes (Signed)
Phone [phone] In person visit   Subjective:   [name] is a 77 y.o. year old very pleasant female patient who presents for/with See problem oriented charting Chief Complaint  Patient presents with   Follow-up    Pt is here to f/u from ER visit for acute cystitis she states she feels a whole lot better.    Past Medical History-  Patient Active Problem List   Diagnosis Date Noted   WPW (Wolff-Parkinson-White syndrome) 03/05/2020    Priority: High   Heart failure with reduced ejection fraction ([place]) 08/21/2019    Priority: High   COVID-19 08/07/2019    Priority: High   Hyperthyroidism 05/22/2017    Priority: High   Migraine 09/19/2014    Priority: High   Diabetes mellitus type II, controlled ([place]) 09/19/2014    Priority: High   Coronary artery disease involving native coronary artery of native heart with angina pectoris ([place])     Priority: High   Seizures ([place])     Priority: High   Overactive bladder 09/19/2014    Priority: Medium    Hypercholesteremia     Priority: Medium    GERD (gastroesophageal reflux disease) 10/04/2011    Priority: Medium    HTN (hypertension) 10/01/2011    Priority: Medium    Aortic atherosclerosis ([place]) 01/09/2020    Priority: Low   Hypokalemia 06/01/2014    Priority: Low   Arthritis     Priority: Low   Pain in left wrist 07/17/2021   Chest pain 02/03/2020   Anemia 12/22/2017    Medications- reviewed and updated Current Outpatient Medications  Medication Sig Dispense Refill   acetaminophen (TYLENOL) 325 MG tablet Take 2 tablets (650 mg total) by mouth 4 (four) times daily. 30 tablet 0   amLODipine (NORVASC) 10 MG tablet TAKE 1 TABLET(10 MG) BY MOUTH DAILY. 30 tablet 0   ASPIRIN LOW DOSE PO Take 81 mg by mouth daily.     atorvastatin (LIPITOR) 40 MG tablet TAKE 1 TABLET(40 MG) BY MOUTH DAILY 90 tablet 3   Cholecalciferol (VITAMIN D3) 25 MCG (1000 UT) CAPS Take by mouth.     Cyanocobalamin (VITAMIN B-12) 3000 MCG SUBL Place  3,000 mg under the tongue daily.     dicyclomine (BENTYL) 20 MG tablet Take 1 tablet (20 mg total) by mouth daily as needed for spasms. 20 tablet 0   esomeprazole (NEXIUM) 20 MG capsule TAKE 1 CAPSULE(20 MG) BY MOUTH DAILY 90 capsule 3   ezetimibe (ZETIA) 10 MG tablet TAKE 1 TABLET(10 MG) BY MOUTH DAILY 90 tablet 3   furosemide (LASIX) 20 MG tablet Take 1 tablet (20 mg total) by mouth daily. 90 tablet 3   isosorbide mononitrate (IMDUR) 120 MG 24 hr tablet Take 1 tablet (120 mg total) by mouth daily. 90 tablet 3   methimazole (TAPAZOLE) 10 MG tablet Take 1 tablet (10 mg total) by mouth 2 (two) times daily. 180 tablet 1   metoprolol succinate (TOPROL-XL) 25 MG 24 hr tablet TAKE 1 TABLET(25 MG) BY MOUTH DAILY 90 tablet 3   nitroGLYCERIN (NITROSTAT) 0.4 MG SL tablet PLACE 1 TABLET UNDER THE TONGUE EVERY 5 MINUTES FOR CHEST PAIN FOR UPTO 3 DOSES 25 tablet 3   ondansetron (ZOFRAN) 4 MG tablet Take 1 tablet (4 mg total) by mouth every 6 (six) hours. 12 tablet 0   oxybutynin (DITROPAN-XL) 10 MG 24 hr tablet TAKE 1 TABLET BY MOUTH EVERY DAY 90 tablet 3   oxyCODONE-acetaminophen (PERCOCET) 10-325 MG tablet as needed.  Taking as needed     potassium chloride (MICRO-K) 10 MEQ CR capsule Take 1 capsule (10 mEq total) by mouth 2 (two) times daily. 180 capsule 3   venlafaxine XR (EFFEXOR-XR) 150 MG 24 hr capsule TAKE 1 CAPSULE BY MOUTH AT BEDTIME. 30 capsule 5   vitamin C (VITAMIN C) 500 MG tablet Take 1 tablet (500 mg total) by mouth 2 (two) times daily. 14 tablet 0   Vitamin E 180 MG CAPS Take 2 capsules by mouth daily.     zinc sulfate 220 (50 Zn) MG capsule Take 1 capsule (220 mg total) by mouth daily. 7 capsule 0   No current facility-administered medications for this visit.     Objective:  BP 104/72   Pulse 81   Temp (!) 97.4 F (36.3 C)   Ht 5\' 6"  (1.676 m)   Wt 141 lb 6.4 oz (64.1 kg)   SpO2 98%   BMI 22.82 kg/m  Gen: NAD, resting comfortably CV: RRR no murmurs rubs or gallops Lungs: CTAB no  crackles, wheeze, rhonchi Abdomen: soft/nontender except for moderate tenderness in suprapubic and left lower quadrant area/nondistended/normal bowel sounds. No rebound or guarding.  Ext: no edema Skin: warm, dry     Assessment and Plan   #Ed follow up for lower abdominal pain thought related to UTI/colitis S: Patients symptoms started in late [facility] emergency room visit due to lower abdominal pain with blood work showing UA consistent with UTI.  CT imaging revealed colitis.  She was treated with pain medication, fluids, antiemetics, p.o. challenge.  Lipase was normal pointing away from pancreatitis.  UTI was treated with antibiotics and recommended outpatient follow-up with me.  Colitis was thought likely viral and was not started on antibiotics as result we will recommend bland diet and PCP follow-up. -ended up having pan sensitive E Coli UTI- treated with Keflex. Had not used bathroom from Christmas eve until Saturday- had hard bowel movements and ongoing abdominal pain- magnesium citrate used to help have these bowel movements- much less than she would usually have. Feels bentyl helped some with pain.   Pain was 10/10 across the abdomen more towards the middle or left. She still has some pain but down to 4-5/10 that comes and goes. Still doing bland diet but still causes abdominal pain and then  needs to have bowel movement right away - but on other hand feels constipated when she gets there. Some nausea at times. Subjective fever last week  Complains of dysuria: none; polyuria: some but improved after treatment; nocturia: yes but about at baseline; urgency: yes.   ROS- no chills, vomiting, flank pain. No blood in urine.  A/P: 77 year old female seen in emergency department almost 2 weeks ago with over 50% improvement in her lower abdominal pain after treatment for UTI (E. coli pansensitive with Keflex 500 mg twice daily) but also found to have colitis and recommended primarily bland  diet. - With ongoing issues will check for UTI again with UA and urine culture - Depending on sensitivities could consider different dosing on Keflex - Bilirubin and protein were slightly high at last visit-recheck with labs - History of mild anemia but better in the hospital and wonder if hemoconcentrated-update CBC - Consider repeat scan but with improvement would rather check on UTI status and also refer to GI for their opinion on if she would be a candidate for colonoscopy and also to address bowel habit changes - Patient reports significant constipation with some improvement with magnesium citrate.  We will trial MiraLAX half capful daily for 1 week and increase to full capful if no improvement and follow-up if failure to improve (I wrote this in on her AVS) -Oxybutynin could also contribute to constipation and UTIs-if recurrent issues we may need to reconsider-I did not specifically discuss this with patient  # Diabetes-new diagnosis 07/09/2022 S: Medication:Diet controlled     Lab Results  Component Value Date   HGBA1C 6.5 07/09/2022   HGBA1C 6.5 07/10/2021   HGBA1C 6.6 (H) 03/10/2021    A/P: Has had diet controlled diabetes-update A1c with labs-continue without medication.  With good control doubt something like diabetic gastroparesis causing abdominal issues   #Nonobstructive CAD by cath in 2018 with angina thought primarily related to coronary vasospasm #hyperlipidemia-LDL March 2021 at 59 #Aortic atherosclerosis S: Medication:Aspirin 81 mg, atorvastatin 40 mg (reported hot flashes and cramps on rosuvastatin 20 mg), Zetia 10 mg, Imdur 120 mg, metoprolol 25 mg extended release  No chest pain or shortness of breath reported on Imdur Lab Results  Component Value Date   CHOL 117 07/09/2022   HDL 43.60 07/09/2022   LDLCALC 58 07/09/2022   LDLDIRECT 68 11/29/2020   TRIG 74.0 07/09/2022   CHOLHDL 3 07/09/2022   A/P: Nonobstructive CAD appears stable/asymptomatic.  LDL is  well-controlled for this as well as aortic atherosclerosis with score under 70-continue all current medications for both issues  #hypertension S: medication: Amlodipine 10 mg, Lasix 20 mg, Imdur 120 mg, metoprolol 25 mg extended release BP Readings from Last 3 Encounters:  11/02/22 104/72  10/21/22 (!) 145/93  09/30/22 (!) 158/79   A/P: Controlled. Continue current medications.   #hyperthyroidism-follows with endocrine-Dr. Lonzo Cloud S: compliant On thyroid medication-methimazole 30 mg in the past-now down to 10 mg BID in sept 2023  Lab Results  Component Value Date   TSH 0.39 09/16/2022  A/P: Has been well-controlled-hold off on rechecking today  Seizures (HCC) S: Patient has reported history of seizures after injury in childhood following.   No regular antiepileptic.  No reported recurrence A/P: Thankfully no recurrence-continue to monitor  Recommended follow up: Return for next already scheduled visit or sooner if needed. Future Appointments  Date Time Provider Department Center  01/07/2023  1:20 PM Shelva Majestic, MD [facility]    Lab/Order associations:   ICD-10-CM   1. Lower abdominal pain  R10.30 Urinalysis, Routine w reflex microscopic    Urine Culture    CBC with Differential/Platelet    Comprehensive metabolic panel    Ambulatory referral to Gastroenterology    2. Colitis  K52.9 CBC with Differential/Platelet    Comprehensive metabolic panel    Ambulatory referral to Gastroenterology    3. Controlled type 2 diabetes mellitus without complication, without long-term current use of insulin (HCC)  E11.9 HgB A1c    4. Aortic atherosclerosis (HCC) Chronic I70.0     5. Coronary artery disease involving native coronary artery of native heart with angina pectoris (HCC) Chronic I25.119     6. Seizures (HCC) Chronic R56.9      No orders of the defined types were placed in this encounter.  Return precautions advised.  Tana Conch, MD

## 2022-11-02 NOTE — Patient Instructions (Addendum)
Tdap recommended at pharmacy when you are feeling better  [facility] contact- please call them- we placed a referral today but you will be seen sooner if you give them a call. Also if still have bladder infection- we may retreat and see if you get better- glad you got better with first round Please call to schedule visit and/or procedure Address: [address], [place], [place] [address] Phone: [phone]   Please stop by lab before you go If you have mychart- we will send your results within 3 business days of [place] receiving them.  If you do not have mychart- we will call you about results within 5 business days of [place] receiving them.  *please also note that you will see labs on mychart as soon as they post. I will later go in and write notes on them- will say "notes from Dr. Yong Channel"   Recommended follow up: Return for next already scheduled visit or sooner if needed.

## 2022-11-03 ENCOUNTER — Telehealth: Payer: Medicare Other

## 2022-11-03 LAB — CBC WITH DIFFERENTIAL/PLATELET
Basophils Absolute: 0 10*3/uL (ref 0.0–0.1)
Basophils Relative: 0.8 % (ref 0.0–3.0)
Eosinophils Absolute: 0.4 10*3/uL (ref 0.0–0.7)
Eosinophils Relative: 8 % — ABNORMAL HIGH (ref 0.0–5.0)
HCT: 37.4 % (ref 36.0–46.0)
Hemoglobin: 12.4 g/dL (ref 12.0–15.0)
Lymphocytes Relative: 38.3 % (ref 12.0–46.0)
Lymphs Abs: 1.9 10*3/uL (ref 0.7–4.0)
MCHC: 33.1 g/dL (ref 30.0–36.0)
MCV: 95.7 fl (ref 78.0–100.0)
Monocytes Absolute: 0.4 10*3/uL (ref 0.1–1.0)
Monocytes Relative: 7.8 % (ref 3.0–12.0)
Neutro Abs: 2.2 10*3/uL (ref 1.4–7.7)
Neutrophils Relative %: 45.1 % (ref 43.0–77.0)
Platelets: 224 10*3/uL (ref 150.0–400.0)
RBC: 3.91 Mil/uL (ref 3.87–5.11)
RDW: 14.5 % (ref 11.5–15.5)
WBC: 4.9 10*3/uL (ref 4.0–10.5)

## 2022-11-03 LAB — COMPREHENSIVE METABOLIC PANEL
ALT: 19 U/L (ref 0–35)
AST: 20 U/L (ref 0–37)
Albumin: 4 g/dL (ref 3.5–5.2)
Alkaline Phosphatase: 58 U/L (ref 39–117)
BUN: 11 mg/dL (ref 6–23)
CO2: 28 mEq/L (ref 19–32)
Calcium: 9.4 mg/dL (ref 8.4–10.5)
Chloride: 103 mEq/L (ref 96–112)
Creatinine, Ser: 1.14 mg/dL (ref 0.40–1.20)
GFR: 46.92 mL/min — ABNORMAL LOW (ref >60.00)
Glucose, Bld: 98 mg/dL (ref 70–99)
Potassium: 3.9 mEq/L (ref 3.5–5.1)
Sodium: 138 mEq/L (ref 135–145)
Total Bilirubin: 0.7 mg/dL (ref 0.2–1.2)
Total Protein: 7.4 g/dL (ref 6.0–8.3)

## 2022-11-03 LAB — URINALYSIS, ROUTINE W REFLEX MICROSCOPIC
Hgb urine dipstick: NEGATIVE
Leukocytes,Ua: NEGATIVE
Nitrite: NEGATIVE
RBC / HPF: NONE SEEN (ref 0–?)
Specific Gravity, Urine: 1.025 (ref 1.000–1.030)
Total Protein, Urine: 30 — AB
Urine Glucose: NEGATIVE
Urobilinogen, UA: 1 (ref 0.0–1.0)
pH: 5.5 (ref 5.0–8.0)

## 2022-11-03 LAB — URINE CULTURE
MICRO NUMBER:: 14401907
Result:: NO GROWTH
SPECIMEN QUALITY:: ADEQUATE

## 2022-11-03 LAB — HEMOGLOBIN A1C: Hgb A1c MFr Bld: 6.6 % — ABNORMAL HIGH (ref 4.6–6.5)

## 2022-11-11 ENCOUNTER — Other Ambulatory Visit: Payer: Self-pay

## 2022-11-11 DIAGNOSIS — R1030 Lower abdominal pain, unspecified: Secondary | ICD-10-CM

## 2022-11-11 DIAGNOSIS — K529 Noninfective gastroenteritis and colitis, unspecified: Secondary | ICD-10-CM

## 2022-11-15 ENCOUNTER — Other Ambulatory Visit: Payer: Self-pay | Admitting: Family Medicine

## 2022-11-18 ENCOUNTER — Emergency Department (HOSPITAL_COMMUNITY)
Admission: EM | Admit: 2022-11-18 | Discharge: 2022-11-19 | Disposition: A | Discharge status: Home / Self Care | Payer: Medicare Other | Attending: Emergency Medicine | Admitting: Emergency Medicine

## 2022-11-18 ENCOUNTER — Encounter (HOSPITAL_COMMUNITY): Payer: Self-pay

## 2022-11-18 ENCOUNTER — Emergency Department (HOSPITAL_COMMUNITY): Payer: Medicare Other

## 2022-11-18 ENCOUNTER — Other Ambulatory Visit: Payer: Self-pay

## 2022-11-18 DIAGNOSIS — R1032 Left lower quadrant pain: Secondary | ICD-10-CM | POA: Insufficient documentation

## 2022-11-18 DIAGNOSIS — R55 Syncope and collapse: Secondary | ICD-10-CM | POA: Insufficient documentation

## 2022-11-18 DIAGNOSIS — I2510 Atherosclerotic heart disease of native coronary artery without angina pectoris: Secondary | ICD-10-CM | POA: Diagnosis not present

## 2022-11-18 DIAGNOSIS — Z7982 Long term (current) use of aspirin: Secondary | ICD-10-CM | POA: Insufficient documentation

## 2022-11-18 DIAGNOSIS — R1031 Right lower quadrant pain: Secondary | ICD-10-CM | POA: Diagnosis not present

## 2022-11-18 DIAGNOSIS — Z79899 Other long term (current) drug therapy: Secondary | ICD-10-CM | POA: Insufficient documentation

## 2022-11-18 DIAGNOSIS — R1030 Lower abdominal pain, unspecified: Secondary | ICD-10-CM

## 2022-11-18 DIAGNOSIS — R109 Unspecified abdominal pain: Secondary | ICD-10-CM | POA: Diagnosis not present

## 2022-11-18 DIAGNOSIS — R110 Nausea: Secondary | ICD-10-CM | POA: Diagnosis not present

## 2022-11-18 DIAGNOSIS — I10 Essential (primary) hypertension: Secondary | ICD-10-CM | POA: Diagnosis not present

## 2022-11-18 DIAGNOSIS — I700 Atherosclerosis of aorta: Secondary | ICD-10-CM | POA: Diagnosis not present

## 2022-11-18 DIAGNOSIS — R404 Transient alteration of awareness: Secondary | ICD-10-CM | POA: Diagnosis not present

## 2022-11-18 DIAGNOSIS — Z743 Need for continuous supervision: Secondary | ICD-10-CM | POA: Diagnosis not present

## 2022-11-18 LAB — BASIC METABOLIC PANEL
Anion gap: 7 (ref 5–15)
BUN: 14 mg/dL (ref 8–23)
CO2: 25 mmol/L (ref 22–32)
Calcium: 9.4 mg/dL (ref 8.9–10.3)
Chloride: 105 mmol/L (ref 98–111)
Creatinine, Ser: 1.13 mg/dL — ABNORMAL HIGH (ref 0.44–1.00)
GFR, Estimated: 50 mL/min — ABNORMAL LOW (ref >60)
Glucose, Bld: 99 mg/dL (ref 70–99)
Potassium: 4.1 mmol/L (ref 3.5–5.1)
Sodium: 137 mmol/L (ref 135–145)

## 2022-11-18 LAB — CBC WITH DIFFERENTIAL/PLATELET
Abs Immature Granulocytes: 0.01 10*3/uL (ref 0.00–0.07)
Basophils Absolute: 0 10*3/uL (ref 0.0–0.1)
Basophils Relative: 1 %
Eosinophils Absolute: 0.2 10*3/uL (ref 0.0–0.5)
Eosinophils Relative: 5 %
HCT: 34.9 % — ABNORMAL LOW (ref 36.0–46.0)
Hemoglobin: 11.1 g/dL — ABNORMAL LOW (ref 12.0–15.0)
Immature Granulocytes: 0 %
Lymphocytes Relative: 43 %
Lymphs Abs: 1.9 10*3/uL (ref 0.7–4.0)
MCH: 31.6 pg (ref 26.0–34.0)
MCHC: 31.8 g/dL (ref 30.0–36.0)
MCV: 99.4 fL (ref 80.0–100.0)
Monocytes Absolute: 0.4 10*3/uL (ref 0.1–1.0)
Monocytes Relative: 8 %
Neutro Abs: 2 10*3/uL (ref 1.7–7.7)
Neutrophils Relative %: 43 %
Platelets: 226 10*3/uL (ref 150–400)
RBC: 3.51 MIL/uL — ABNORMAL LOW (ref 3.87–5.11)
RDW: 14.4 % (ref 11.5–15.5)
WBC: 4.5 10*3/uL (ref 4.0–10.5)
nRBC: 0 % (ref 0.0–0.2)

## 2022-11-18 LAB — TROPONIN I (HIGH SENSITIVITY)
Troponin I (High Sensitivity): 13 ng/L (ref <18)
Troponin I (High Sensitivity): 12 ng/L (ref <18)

## 2022-11-18 LAB — POC OCCULT BLOOD, ED: Fecal Occult Bld: NEGATIVE

## 2022-11-18 MED ORDER — IOHEXOL 350 MG/ML SOLN
75.0000 mL | Freq: Once | INTRAVENOUS | Status: AC | PRN
  Administered 2022-11-18: 75 mL via INTRAVENOUS

## 2022-11-18 MED ORDER — SODIUM CHLORIDE 0.9 % IV BOLUS
500.0000 mL | Freq: Once | INTRAVENOUS | Status: AC
  Administered 2022-11-18: 500 mL via INTRAVENOUS

## 2022-11-18 NOTE — ED Provider Notes (Signed)
[facility] Provider Note   CSN: [id number] Arrival date & time: 11/18/22  1204     History  Chief Complaint  Patient presents with   Near Syncope    Lolita Faulds is a 77 y.o. female.  Patient with history of CAD, diverticulitis, hypertension, GERD presents today with complaints of syncope. She states that same occurred around noon today when she sitting in a chair and states she suddenly felt like the room was shaking and she got warm and had a syncopal episode. States that her son caught her before she fell and she immediately regained consciousness. She did not hit her head or have any other injuries. She that she has been up and moving around since then without any recurrent symptoms. She denies headache, vision changes, chest pain, or shortness of breath. States that for the past 2 weeks she has been having abdominal pain and constipation. States that she has not had a full bowel movement for 2 weeks. She states her pain is in her RLQ and her LLQ. States that it feels similar to when she had colitis around Christmas last year. She also endorses a few days of bloody mucus-appearing stools. Endorses nausea without vomiting. Denies hematuria or dysuria.   The history is provided by the patient. No language interpreter was used.  Near Syncope Associated symptoms include abdominal pain.       Home Medications Prior to Admission medications   Medication Sig Start Date End Date Taking? Authorizing Provider  acetaminophen (TYLENOL) 325 MG tablet Take 2 tablets (650 mg total) by mouth 4 (four) times daily. 06/07/22   Mesner, Corene Cornea, MD  amLODipine (NORVASC) 10 MG tablet TAKE 1 TABLET(10 MG) BY MOUTH DAILY. 09/30/22   Marin Olp, MD  ASPIRIN LOW DOSE PO Take 81 mg by mouth daily.    [provider]  atorvastatin (LIPITOR) 40 MG tablet TAKE 1 TABLET(40 MG) BY MOUTH DAILY 01/06/22   Elgie Collard, PA-C  Cholecalciferol (VITAMIN D3)  25 MCG (1000 UT) CAPS Take by mouth.    [provider]  Cyanocobalamin (VITAMIN B-12) 3000 MCG SUBL Place 3,000 mg under the tongue daily.    [provider]  dicyclomine (BENTYL) 20 MG tablet Take 1 tablet (20 mg total) by mouth daily as needed for spasms. 10/21/22   Marcello Fennel, PA-C  esomeprazole (NEXIUM) 20 MG capsule TAKE 1 CAPSULE(20 MG) BY MOUTH DAILY 07/14/22   Marin Olp, MD  ezetimibe (ZETIA) 10 MG tablet TAKE 1 TABLET(10 MG) BY MOUTH DAILY 01/06/22   Elgie Collard, PA-C  furosemide (LASIX) 20 MG tablet Take 1 tablet (20 mg total) by mouth daily. 01/06/22   Elgie Collard, PA-C  isosorbide mononitrate (IMDUR) 120 MG 24 hr tablet Take 1 tablet (120 mg total) by mouth daily. 01/06/22   Elgie Collard, PA-C  methimazole (TAPAZOLE) 10 MG tablet Take 1 tablet (10 mg total) by mouth 2 (two) times daily. 04/22/22   Shamleffer, Melanie Crazier, MD  metoprolol succinate (TOPROL-XL) 25 MG 24 hr tablet TAKE 1 TABLET(25 MG) BY MOUTH DAILY 01/06/22   Elgie Collard, PA-C  nitroGLYCERIN (NITROSTAT) 0.4 MG SL tablet PLACE 1 TABLET UNDER THE TONGUE EVERY 5 MINUTES FOR CHEST PAIN FOR UPTO 3 DOSES 01/06/22   Elgie Collard, PA-C  ondansetron (ZOFRAN) 4 MG tablet Take 1 tablet (4 mg total) by mouth every 6 (six) hours. 10/21/22   Marcello Fennel, PA-C  oxybutynin (  DITROPAN-XL) 10 MG 24 hr tablet TAKE 1 TABLET BY MOUTH EVERY DAY 05/07/22   Marin Olp, MD  oxyCODONE-acetaminophen (PERCOCET) 10-325 MG tablet as needed. Taking as needed 12/19/21   [provider]  potassium chloride (MICRO-K) 10 MEQ CR capsule Take 1 capsule (10 mEq total) by mouth 2 (two) times daily. 01/06/22   Elgie Collard, PA-C  venlafaxine XR (EFFEXOR-XR) 150 MG 24 hr capsule TAKE 1 CAPSULE BY MOUTH AT BEDTIME 11/16/22   Marin Olp, MD  vitamin C (VITAMIN C) 500 MG tablet Take 1 tablet (500 mg total) by mouth 2 (two) times daily. 08/08/19   Shelly Coss, MD  Vitamin E 180 MG CAPS Take 2  capsules by mouth daily.    [provider]  zinc sulfate 220 (50 Zn) MG capsule Take 1 capsule (220 mg total) by mouth daily. 08/09/19   Shelly Coss, MD      Allergies    Crestor [rosuvastatin] and Sulfa drugs cross reactors    Review of Systems   Review of Systems  Cardiovascular:  Positive for near-syncope.  Gastrointestinal:  Positive for abdominal pain.  All other systems reviewed and are negative.   Physical Exam Updated Vital Signs BP 134/69   Pulse 68   Temp 98.6 F (37 C) (Oral)   Resp 17   SpO2 100%  Physical Exam Vitals and nursing note reviewed. Exam conducted with a chaperone present.  Constitutional:      General: She is not in acute distress.    Appearance: Normal appearance. She is normal weight. She is not ill-appearing, toxic-appearing or diaphoretic.  HENT:     Head: Normocephalic and atraumatic.  Eyes:     Extraocular Movements: Extraocular movements intact.     Pupils: Pupils are equal, round, and reactive to light.  Cardiovascular:     Rate and Rhythm: Normal rate and regular rhythm.     Heart sounds: Normal heart sounds.  Pulmonary:     Effort: Pulmonary effort is normal. No respiratory distress.     Breath sounds: Normal breath sounds.  Abdominal:     General: Abdomen is flat.     Palpations: Abdomen is soft.     Tenderness: There is abdominal tenderness in the right lower quadrant and left lower quadrant.  Genitourinary:    Comments: Trace amount of brown stool present in the rectal vault. No palpable fecal impaction or blood visualized. No hemorrhoids or fissures.  Musculoskeletal:        General: No tenderness. Normal range of motion.     Cervical back: Normal range of motion and neck supple. No tenderness.     Right lower leg: No edema.     Left lower leg: No edema.  Skin:    General: Skin is warm and dry.  Neurological:     General: No focal deficit present.     Mental Status: She is alert and oriented to person, place,  and time.  Psychiatric:        Mood and Affect: Mood normal.        Behavior: Behavior normal.     ED Results / Procedures / Treatments   Labs (all labs ordered are listed, but only abnormal results are displayed) Labs Reviewed  BASIC METABOLIC PANEL - Abnormal; Notable for the following components:      Result Value   Creatinine, Ser 1.13 (*)    GFR, Estimated 50 (*)    All other components within normal limits  CBC  WITH DIFFERENTIAL/PLATELET - Abnormal; Notable for the following components:   RBC 3.51 (*)    Hemoglobin 11.1 (*)    HCT 34.9 (*)    All other components within normal limits  URINALYSIS, ROUTINE W REFLEX MICROSCOPIC  HEPATIC FUNCTION PANEL  POC OCCULT BLOOD, ED  TROPONIN I (HIGH SENSITIVITY)  TROPONIN I (HIGH SENSITIVITY)    EKG EKG Interpretation  Date/Time:  Wednesday November 18 2022 13:29:05 EST Ventricular Rate:  59 PR Interval:  122 QRS Duration: 102 QT Interval:  410 QTC Calculation: 405 R Axis:   56 Text Interpretation: Sinus bradycardia ST & T wave abnormality, consider inferior ischemia Abnormal ECG When compared with ECG of 06-Jun-2022 18:23, PREVIOUS ECG IS PRESENT Confirmed by Lacretia Leigh ([id number]) on 11/18/2022 10:25:16 PM  Radiology DG Chest 2 View  Result Date: 11/18/2022 CLINICAL DATA:  Syncope EXAM: CHEST - 2 VIEW COMPARISON:  CXR 02/04/22 FINDINGS: No pleural effusion. No pneumothorax. Normal cardiac and mediastinal contours. No focal airspace opacity. No displaced rib fractures. Postprocedural changes from rotator cuff repair on the right. Aortic atherosclerotic calcifications. IMPRESSION: No active cardiopulmonary disease. Electronically Signed   By: Marin Roberts M.D.   On: 11/18/2022 14:11    Procedures Procedures    Medications Ordered in ED Medications  sodium chloride 0.9 % bolus 500 mL (500 mLs Intravenous New Bag/Given 11/18/22 2313)  iohexol (OMNIPAQUE) 350 MG/ML injection 75 mL (75 mLs Intravenous Contrast Given 11/18/22  2326)    ED Course/ Medical Decision Making/ A&P                             Medical Decision Making Amount and/or Complexity of Data Reviewed Labs: ordered. Radiology: ordered.  Risk Prescription drug management.   This patient is a 77 y.o. female who presents to the ED for concern of syncope and abdominal pain, this involves an extensive number of treatment options, and is a complaint that carries with it a high risk of complications and morbidity. The emergent differential diagnosis prior to evaluation includes, but is not limited to,  CVA, ACS, arrhythmia, vasovagal syncope, orthostatic hypotension, sepsis, hypoglycemia, electrolyte disturbance, respiratory failure, symptomatic anemia, dehydration, heat injury, polypharmacy, malignancy, anxiety/panic attack. The differential diagnosis for generalized abdominal pain includes, but is not limited to AAA, gastroenteritis, appendicitis, Bowel obstruction, Bowel perforation. Gastroparesis, DKA, Hernia, Inflammatory bowel disease, mesenteric ischemia, pancreatitis, peritonitis SBP, volvulus.  This is not an exhaustive differential.   Past Medical History / Co-morbidities / Social History: Hx CAD, diverticulitis, hypertension, GERD  Additional history: Chart reviewed. Pertinent results include: recent CT imaging on 12/26 which showed colitis  Physical Exam: Physical exam performed. The pertinent findings include: lower abdominal tenderness  Lab Tests: I ordered, and personally interpreted labs.  The pertinent results include:  Creatinine 1.13 consistent with previous. Hgb 11.1 down from 12.4 2 weeks ago. Hemoccult negative, troponin flat   Imaging Studies: I ordered imaging studies including CXR, CT abdomen pelvis. I independently visualized and interpreted imaging which showed   CXR: NAD  I agree with the radiologist interpretation.  CT: pending at shift change  Cardiac Monitoring:  The patient was maintained on a cardiac  monitor.  My attending physician Dr. Zenia Resides viewed and interpreted the cardiac monitored which showed an underlying rhythm of: sinus bradycardia. I agree with this interpretation.   Medications: I ordered medication including fluids  for dehydration. Reevaluation of the patient after these medicines showed that the patient  reassessment pending  at shift change . I have reviewed the patients home medicines and have made adjustments as needed.    Disposition:  Patient presents today with complaints of syncope and abdominal pain.  She is afebrile, nontoxic-appearing, and in no acute distress with reassuring vital signs.  Physical exam reveals no abdominal tenderness.  No neurodeficits.  No murmur.  Low suspicion for ACS/PE, CVA, or acute intracranial abnormalities.  Patient denies any chest pain or shortness of breath.  Patient is ambulatory in the ER without any subsequent feeling like she is going to pass out. She does state that she hasn't been eating much recently. Suspect she may be dehydrated. Given fluids for same. CT imaging and reassessment pending at shift change.   Care handoff to Berle Mull, PA-C at shift change. Please see their note for further evaluation and dispo.  This is a shared visit with supervising physician Dr. Freida Busman who has independently evaluated patient & provided guidance in evaluation/management/disposition, in agreement with care    Final Clinical Impression(s) / ED Diagnoses Final diagnoses:  Syncope, unspecified syncope type  Lower abdominal pain    Rx / DC Orders ED Discharge Orders     None         Vear Clock 11/20/22 0032    Lorre Nick, MD 11/21/22 1352

## 2022-11-18 NOTE — ED Notes (Signed)
Pt called 2x no answer 

## 2022-11-18 NOTE — ED Triage Notes (Signed)
Pt came BIB [facility] from home d/t a near syncopal event that happened while she was sitting in a chair at home. Denies and pain from sliding out of chair & denies LOC, does endorse dizziness. EMS reports she has chronic abd pain with nausea. A/Ox4, 130/70, 60 bpm, 17 Resp, 97% on RA, CBG 126, no PIV from EMS upon arrival.

## 2022-11-18 NOTE — ED Provider Triage Note (Signed)
Emergency Medicine Provider Triage Evaluation Note  [name] , a 77 y.o. female  was evaluated in triage.  Pt complains of presyncopal sensation around 1130/12 today.  States she was sitting when she felt like she might lose consciousness, felt dizzy and nauseous.  Also reports chest pain that "felt like a heart attack" twice within the last 3 days.  Denies active chest pain or discomfort, shortness of breath, or N/V.  No recent URI symptoms.  Denies recent fever or chills.  Hx of DMT2, WPW, CHF with reduced EF, CAD  CBG 126  Review of Systems  Positive:  Negative: See above  Physical Exam  BP 118/79   Pulse 60   Temp 98.1 F (36.7 C) (Oral)   Resp (!) 21   SpO2 98%  Gen:   Awake, no distress   Resp:  Normal effort  MSK:   Moves extremities without difficulty  Other:  Sitting comfortably, chest non-TTP.  HR between 58 to 64 bpm, regular rhythm.  Gaze aligned appropriately.  Abdomen soft, non-TTP.  Medical Decision Making  Medically screening exam initiated at 1:31 PM.  Appropriate orders placed.  Seema Blum was informed that the remainder of the evaluation will be completed by another provider, this initial triage assessment does not replace that evaluation, and the importance of remaining in the ED until their evaluation is complete.     Prince Rome, PA-C 44/31/54 1333

## 2022-11-19 LAB — URINALYSIS, ROUTINE W REFLEX MICROSCOPIC
Bacteria, UA: NONE SEEN
Bilirubin Urine: NEGATIVE
Glucose, UA: NEGATIVE mg/dL
Hgb urine dipstick: NEGATIVE
Ketones, ur: NEGATIVE mg/dL
Leukocytes,Ua: NEGATIVE
Nitrite: NEGATIVE
Protein, ur: NEGATIVE mg/dL
Specific Gravity, Urine: 1.019 (ref 1.005–1.030)
pH: 7 (ref 5.0–8.0)

## 2022-11-19 LAB — HEPATIC FUNCTION PANEL
ALT: 13 U/L (ref 0–44)
AST: 21 U/L (ref 15–41)
Albumin: 3.8 g/dL (ref 3.5–5.0)
Alkaline Phosphatase: 53 U/L (ref 38–126)
Bilirubin, Direct: 0.3 mg/dL — ABNORMAL HIGH (ref 0.0–0.2)
Indirect Bilirubin: 0.8 mg/dL (ref 0.3–0.9)
Total Bilirubin: 1.1 mg/dL (ref 0.3–1.2)
Total Protein: 7.5 g/dL (ref 6.5–8.1)

## 2022-11-19 MED ORDER — MECLIZINE HCL 12.5 MG PO TABS: 12.5000 mg | ORAL_TABLET | Freq: Three times a day (TID) | ORAL | 0 refills | Status: AC | PRN

## 2022-11-19 NOTE — ED Notes (Signed)
Pt requesting to speak to a provider before discharge. RN asked patient how she would get home and patient stated "I will call somebody to come get me with my cell phone."

## 2022-11-19 NOTE — Discharge Instructions (Signed)
CT scan shows that you have constipation, I want you to start taking 2 capfuls of MiraLAX daily until you have a bowel movement, after that I want you to go back to 1 capful daily.  You may titrate until you get your desired consistency and frequency.  This medication only works you you are hydrated please drink plenty of fluids.  Please eat foods high in fiber, drink plenty of water.  Remember exercises can also help with constipation.  Follow with your primary care provider Dizziness-likely you had vertigo, given you medication called meclizine to use as needed for dizziness.  Please follow-up with neurology for further evaluation.  Come back to the emergency department if you develop chest pain, shortness of breath, severe abdominal pain, uncontrolled nausea, vomiting, diarrhea.

## 2022-11-19 NOTE — ED Provider Notes (Signed)
Patient was received at shift change from Lavonna Rua, PA-C please see note for full detail  Patient with medical history including CAD, diverticulitis, hypertension, GERD presents with complaints of a dizzy spell.  He states that when she was walking across the yard she felt as if the yard was  spinning, she states it lasted approxi-1 minute then resolved.  She had no change in vision no paresthesias or weakness of the upper or lower extremities, no head trauma there is no actual syncope.  She states she has not had this episode since being in the hospital.  She also says she is having abdominal pain going on for last couple days she send is generalized, she has not had a bowel movement last 2 weeks, she has no complaints.  Per previous provider follow-up on CT scan if unremarkable patient can be discharged home.   Physical Exam  BP 127/61   Pulse 73   Temp 98.6 F (37 C) (Oral)   Resp 17   SpO2 98%   Physical Exam Vitals and nursing note reviewed.  Constitutional:      General: She is not in acute distress.    Appearance: She is not ill-appearing.  HENT:     Head: Normocephalic and atraumatic.     Nose: No congestion.  Eyes:     Conjunctiva/sclera: Conjunctivae normal.  Cardiovascular:     Rate and Rhythm: Normal rate and regular rhythm.     Pulses: Normal pulses.     Heart sounds: No murmur heard.    No friction rub. No gallop.  Pulmonary:     Effort: No respiratory distress.     Breath sounds: No wheezing, rhonchi or rales.  Abdominal:     Palpations: Abdomen is soft.     Tenderness: There is abdominal tenderness. There is no right CVA tenderness or left CVA tenderness.     Comments: Abdomen nondistended, soft, slight tenderness noted throughout the abdomen did not focalized, no guarding rebound tenderness or peritoneal sign.  Skin:    General: Skin is warm and dry.  Neurological:     Mental Status: She is alert.     Comments: No facial asymmetry, cranial nerves II  through XII grossly intact, no difficulty with word finding following two-step commands there is no unilateral weakness present.  Gait fully intact with ambulation.  Psychiatric:        Mood and Affect: Mood normal.     Procedures  Procedures  ED Course / MDM    Medical Decision Making Amount and/or Complexity of Data Reviewed Labs: ordered. Radiology: ordered.  Risk Prescription drug management.      Lab Tests:  I Ordered, and personally interpreted labs.  The pertinent results include: CBC shows normocytic anemia hemoglobin 11.1, BMP shows creatinine 1.31, GFR 50, hepatic panel unremarkable, negative delta troponin, Hemoccult negative   Imaging Studies ordered:  I ordered imaging studies including chest x-ray, CT abdomen pelvis I independently visualized and interpreted imaging which showed chest x-ray negative CT scan negative for acute findings I agree with the radiologist interpretation   Cardiac Monitoring:  The patient was maintained on a cardiac monitor.  I personally viewed and interpreted the cardiac monitored which showed an underlying rhythm of: N/A   Medicines ordered and prescription drug management:  I ordered medication including N/A I have reviewed the patients home medicines and have made adjustments as needed  Critical Interventions:  N/A   Reevaluation:  Patient is reassessed resting comfortably having no complaints,  updated on her imaging, she is agreement discharge at this time.  Consultations Obtained:  N/A    Test Considered:  CT head-deferred as my suspicion for intracranial bleed is low at this time, no falls, not anticoag's, no focal deficit present my exam.    Rule out low suspicion for CVA or intracranial head bleed as patient denies change in vision, paresthesias or weakness to upper lower extremities, no neuro deficits noted on exam,  Low suspicion for ACS or arrhythmias as patient denies chest pain, shortness of  breath, no hypoperfusion or fluid overload on exam, EKG sinus without signs of ischemia, negative delta troponin.  Low suspicion for systemic infection as patient is nontoxic-appearing, vital signs reassuring, no obvious source infection noted on exam.       Dispostion and problem list  After consideration of the diagnostic results and the patients response to treatment, I feel that the patent would benefit from discharge.  Abdominal pain-likely secondary due to constipation, she does not have a large stool burden noted on previous providers rectal examination, will defer on enema at this time.  Will recommend MiraLAX at home follow-up with PCP. Dizziness-sounds like episode of vertigo, will provide her with meclizine, and have her follow-up with neurologist for further evaluation strict return precautions.           Marcello Fennel, PA-C 11/19/22 [facility]    Ripley Fraise, MD 11/19/22 [phone]

## 2022-11-19 NOTE — ED Notes (Signed)
Patient verbalizes understanding of d/c instructions. Opportunities for questions and answers were provided. Pt d/c from ED and wheeled to lobby.  

## 2022-11-20 ENCOUNTER — Telehealth: Payer: Self-pay | Admitting: Pharmacist

## 2022-11-20 NOTE — Progress Notes (Unsigned)
Care Management & Coordination Services Pharmacy Note  11/20/2022 Name:  [name] MRN:  [id number] DOB:  08-30-1902  Summary: PharmD FU visit.  Recent episode of syncope has not happened since and is now on meclizine.  BP/HR not being monitored at home, asked her to do this since she does have access to a cuff.  TSH normal and methimazole dose now correct.  Recommendations/Changes made from today's visit: No changes at this time - monitor record BP  Follow up plan: FU 6 months CMA BP check 3 months   Subjective: [name] is an 77 y.o. year old female who is a primary patient of Durene Cal, Aldine Contes, MD.  The care coordination team was consulted for assistance with disease management and care coordination needs.    Engaged with patient by telephone for follow up visit.  Recent office visits:  07/10/2021 OV Alyssa Allwardt, PA-C; EKG performed in office today shows no acute changes, very comparable to her previous EKG.  Looking back on her history it looks like these complaints are not uncommon for her.  She is going to contact her cardiologist for follow-up.    04/03/2021 OV (PCP) Shelva Majestic, MD; Suspected mild poor control-patient has not had a chance to reduce from 2 pills to 1 pill per day of methimazole 10 mg (in the past has been as high as 30 mg)- she agrees to make this adjustment today.    Recent consult visits:  07/17/2021 OV (orthopedics) Valeria Batman, MD; pain in left wrist, I think it is worth obtaining an MRI - no medication changes indicated.   03/10/2021 )V (endocrinology) Romero Belling, MD; Hyperthyroidism: overcontrolled.  reduce the methimazole to 10 mg per day.   Hospital visits:  03/30/2021 ED Visit for unspecified chest pain and weakness,  Cardiac workup initiated. No medication changes indicated.   Objective:  Lab Results  Component Value Date   CREATININE 1.13 (H) 11/18/2022   BUN 14 11/18/2022   GFR 46.92 (L) 11/02/2022   GFRNONAA 50  (L) 11/18/2022   GFRAA 56 (L) 02/04/2020   NA 137 11/18/2022   K 4.1 11/18/2022   CALCIUM 9.4 11/18/2022   CO2 25 11/18/2022   GLUCOSE 99 11/18/2022    Lab Results  Component Value Date/Time   HGBA1C 6.6 (H) 11/02/2022 04:47 PM   HGBA1C 6.5 07/09/2022 01:55 PM   GFR 46.92 (L) 11/02/2022 04:47 PM   GFR 46.96 (L) 09/30/2022 12:58 PM   MICROALBUR 1.6 09/16/2022 12:27 PM    Last diabetic Eye exam: No results found for: "HMDIABEYEEXA"  Last diabetic Foot exam: No results found for: "HMDIABFOOTEX"   Lab Results  Component Value Date   CHOL 117 07/09/2022   HDL 43.60 07/09/2022   LDLCALC 58 07/09/2022   LDLDIRECT 68 11/29/2020   TRIG 74.0 07/09/2022   CHOLHDL 3 07/09/2022       Latest Ref Rng & Units 11/18/2022   11:11 PM 11/02/2022    4:47 PM 10/20/2022    4:22 PM  Hepatic Function  Total Protein 6.5 - 8.1 g/dL 7.5  7.4  8.9   Albumin 3.5 - 5.0 g/dL 3.8  4.0  4.3   AST 15 - 41 U/L 21  20  20    ALT 0 - 44 U/L 13  19  18    Alk Phosphatase 38 - 126 U/L 53  58  71   Total Bilirubin 0.3 - 1.2 mg/dL 1.1  0.7  1.4   Bilirubin, Direct 0.0 - 0.2  mg/dL 0.3       Lab Results  Component Value Date/Time   TSH 0.39 09/16/2022 12:27 PM   TSH 4.51 07/09/2022 01:55 PM   FREET4 0.74 09/16/2022 12:27 PM   FREET4 1.08 04/22/2022 08:26 AM       Latest Ref Rng & Units 11/18/2022    1:26 PM 11/02/2022    4:47 PM 10/20/2022    4:22 PM  CBC  WBC 4.0 - 10.5 K/uL 4.5  4.9  6.5   Hemoglobin 12.0 - 15.0 g/dL 43.1  54.0  08.6   Hematocrit 36.0 - 46.0 % 34.9  37.4  42.2   Platelets 150 - 400 K/uL 226  224.0  266     Lab Results  Component Value Date/Time   VITAMINB12 547 04/03/2021 02:24 PM    Clinical ASCVD: Yes  The ASCVD Risk score (Arnett DK, et al., 2019) failed to calculate for the following reasons:   The patient has a prior MI or stroke diagnosis        05/12/2022   10:04 AM 02/06/2021    2:13 PM 11/29/2020    1:49 PM  Depression screen PHQ 2/9  Decreased Interest 0 0 0   Down, Depressed, Hopeless 1 0 0  PHQ - 2 Score 1 0 0     Social History   Tobacco Use  Smoking Status Never  Smokeless Tobacco Never   BP Readings from Last 3 Encounters:  11/19/22 127/61  11/02/22 104/72  10/21/22 (!) 145/93   Pulse Readings from Last 3 Encounters:  11/19/22 73  11/02/22 81  10/21/22 75   Wt Readings from Last 3 Encounters:  11/02/22 141 lb 6.4 oz (64.1 kg)  09/30/22 145 lb 8 oz (66 kg)  09/16/22 150 lb 9.6 oz (68.3 kg)   BMI Readings from Last 3 Encounters:  11/02/22 22.82 kg/m  09/30/22 23.48 kg/m  09/16/22 24.31 kg/m    Allergies  Allergen Reactions   Crestor [Rosuvastatin] Other (See Comments)    Hot flashes and severe cramps   Sulfa Drugs Cross Reactors Shortness Of Breath and Palpitations    Medications Reviewed Today     Reviewed by Lorenza Evangelist, CMA (Certified Medical Assistant) on 11/02/22 at 1556  Med List Status: <None>   Medication Order Taking? Sig Documenting Provider Last Dose Status Informant  acetaminophen (TYLENOL) 325 MG tablet [id number]  Take 2 tablets (650 mg total) by mouth 4 (four) times daily. Mesner, Barbara Cower, MD  Active   Aloe Vera 25 MG CAPS [id number]  Take by mouth. [provider]  Active   amLODipine (NORVASC) 10 MG tablet [id number]  TAKE 1 TABLET(10 MG) BY MOUTH DAILY. Shelva Majestic, MD  Active   ASPIRIN LOW DOSE PO [id number]  Take 81 mg by mouth daily. [provider]  Active Self  atorvastatin (LIPITOR) 40 MG tablet [id number]  TAKE 1 TABLET(40 MG) BY MOUTH DAILY Asa Lente, Tessa N, PA-C  Active   Cholecalciferol (VITAMIN D3) 25 MCG (1000 UT) CAPS [id number]  Take by mouth. [provider]  Active   Cobalamin Combinations (NEURIVA PLUS) CAPS [id number]  Take by mouth. [provider]  Active   Cyanocobalamin (VITAMIN B-12) 3000 MCG SUBL [id number]  Place 3,000 mg under the tongue daily. [provider]  Active   dicyclomine (BENTYL) 20 MG tablet [id number]  Take 1 tablet  (20 mg total) by mouth daily as needed for spasms. Carroll Sage, PA-C  Active   esomeprazole (NEXIUM) 20 MG capsule  [id number]  TAKE 1 CAPSULE(20 MG) BY MOUTH DAILY Marin Olp, MD  Active   ezetimibe (ZETIA) 10 MG tablet [id number]  TAKE 1 TABLET(10 MG) BY MOUTH DAILY Elgie Collard, PA-C  Active   furosemide (LASIX) 20 MG tablet [id number]  Take 1 tablet (20 mg total) by mouth daily. Elgie Collard, PA-C  Active   isosorbide mononitrate (IMDUR) 120 MG 24 hr tablet [id number]  Take 1 tablet (120 mg total) by mouth daily. Elgie Collard, PA-C  Active   methimazole (TAPAZOLE) 10 MG tablet [id number]  Take 1 tablet (10 mg total) by mouth 2 (two) times daily. Shamleffer, Melanie Crazier, MD  Active   metoprolol succinate (TOPROL-XL) 25 MG 24 hr tablet [id number]  TAKE 1 TABLET(25 MG) BY MOUTH DAILY Harriet Pho, Tessa N, PA-C  Active   nitroGLYCERIN (NITROSTAT) 0.4 MG SL tablet [id number]  PLACE 1 TABLET UNDER THE TONGUE EVERY 5 MINUTES FOR CHEST PAIN FOR UPTO 3 DOSES Conte, Tessa N, PA-C  Active   ondansetron (ZOFRAN) 4 MG tablet [id number]  Take 1 tablet (4 mg total) by mouth every 6 (six) hours. Marcello Fennel, PA-C  Active   oxybutynin (DITROPAN-XL) 10 MG 24 hr tablet [id number]  TAKE 1 TABLET BY MOUTH EVERY DAY Marin Olp, MD  Active   oxyCODONE-acetaminophen (PERCOCET) 10-325 MG tablet [id number]  as needed. Taking as needed [provider]  Active   potassium chloride (MICRO-K) 10 MEQ CR capsule [id number]  Take 1 capsule (10 mEq total) by mouth 2 (two) times daily. Elgie Collard, PA-C  Active   Prenatal Vit-Fe Fumarate-FA (PRENATAL MULTIVITAMIN) TABS tablet [id number]  Take 1 tablet by mouth daily at 12 noon. [provider]  Active Self  venlafaxine XR (EFFEXOR-XR) 150 MG 24 hr capsule [id number]  TAKE 1 CAPSULE BY MOUTH AT BEDTIME. Marin Olp, MD  Active   vitamin C (VITAMIN C) 500 MG tablet [id number]  Take 1 tablet (500 mg total) by mouth 2 (two) times daily.  Shelly Coss, MD  Active Self  Vitamin E 180 MG CAPS [id number]  Take 2 capsules by mouth daily. [provider]  Active Self  zinc sulfate 220 (50 Zn) MG capsule [id number]  Take 1 capsule (220 mg total) by mouth daily. Shelly Coss, MD  Active Self            SDOH:  (Social Determinants of Health) assessments and interventions performed: No, done within the past year Financial Resource Strain: Low Risk  (05/12/2022)   Overall Financial Resource Strain (CARDIA)    Difficulty of Paying Living Expenses: Not hard at all   Food Insecurity: No Food Insecurity (05/12/2022)   Hunger Vital Sign    Worried About Running Out of Food in the Last Year: Never true    Ran Out of Food in the Last Year: Never true    SDOH Interventions    Flowsheet Row Clinical Support from 01/09/2020 in [place] Most recent reading at 01/09/2020  4:02 PM Office Visit from 01/09/2020 in [place] Most recent reading at 01/09/2020  1:05 PM  SDOH Interventions    Depression Interventions/Treatment  Currently on Treatment Counseling       Medication Assistance: None required.  Patient affirms current coverage meets needs.  Medication Access: Within the past 30 days, how often has patient missed a dose of medication? 0 Is a pillbox or other method used to improve adherence? No  Factors that may affect medication adherence?  no barriers identified Are meds synced by current pharmacy? No  Are meds delivered by current pharmacy? No  Does patient experience delays in picking up medications due to transportation concerns? No   [facility] Reviewed: Is patient disadvantaged to use [facility]?: No  Current Rx insurance plan: [employer] Name and location of Current pharmacy:  [facility] [place], [place] [address] Phone: [phone] Fax:  [phone]  [facility] services reviewed with patient today?: Yes  Patient requests to transfer care to [facility]?: No  Reason patient declined to change pharmacies: Loyalty to other pharmacy/Patient preference and Patient is already actively enrolled with [facility]  Compliance/Adherence/Medication fill history: Care Gaps: Care Gaps: Annual wellness visit in last year? Yes   If Diabetic: Last eye exam / retinopathy screening: Never done Last diabetic foot exam: 09/16/2022  Star-Rating Drugs: Atorvastatin last filled 11/16/2022 90 DS    Assessment/Plan    Hypertension (BP goal <130/80) -Controlled, not assessed today -Current treatment: Amlodipine 10mg  daily Isosorbide 120mg  24hr daily Metoprolol Xl 25mg  daily -Medications previously tried: propronolol  -Current home readings: not checking -Current exercise habits: very active around the house, "always moving" no organized physical activity -Reports hypotensive/hypertensive symptoms - dizziness occasionally at random times -Educated on BP goals and benefits of medications for prevention of heart attack, stroke and kidney damage; Importance of home blood pressure monitoring; Symptoms of hypotension and importance of maintaining adequate hydration; -Counseled to monitor BP at home as able, document, and provide log at future appointments -Recommended to continue current medication Report dizziness to providers  Hyperlipidemia: (LDL goal < 70) -Controlled, not assessed today -Current treatment: Atorvastatin 40mg  Zetia 10mg  daily -Medications previously tried: Rosuvastatin (cramps)  -Educated on Cholesterol goals;  Benefits of statin for ASCVD risk reduction; Importance of limiting foods high in cholesterol; Most recent LDL is controlled -Recommended to continue current medication  Heart Failure (Goal: manage symptoms and prevent exacerbations) -Controlled, not assessed today -Last ejection  fraction: 55-60%  -Current treatment: Furosemide 20mg  daily Metoprolol XL 25mg  daily Imdur 120mg  24hr daily -Medications previously tried: none noted  -Current home BP/HR readings: not checking, normal in office -Educated on Benefits of medications for managing symptoms and prolonging life Importance of weighing daily; if you gain more than 3 pounds in one day or 5 pounds in one week, contact providers Proper diuretic administration and potassium supplementation -Recommended to continue current medication  GERD (Goal: Control symptoms) 11/23/22 -Not ideally controlled -Current treatment  Esomeprazole 20mg  daily Appropriate, Query effective,  -Medications previously tried: none noted -Did switch to morning dosing, still having to take the gas pills.  Mentions her symptoms have improved somewhat. -Dexa scan still outstanding.  Patient aware but unsure if she is going to complete.  Discussed risks.  Continue to follow at this time.  Initial Visit Recommend she take medication in the am on empty stomach separate from other meds.  See if this helps symptoms.  Hyperthyroidism (Goal: Maintain TSH) 11/23/22 -Controlled, last TSH normal -Current treatment  Methimazole 10mg  Appropriate, Query effective, -Medications previously tried: none noted -Today, she reports she is taking correct dose of methimazole.  Her TSH has been controlled.   Has period of syncope with ED visit.  Believed to be vertigo - she is not on meclizine for this and is controlled. No changes - continue routine TSH monitoring. Will monitor adherence.   Anemia (Goal:  Maintain Hgb) -Controlled, not assessed today -Current treatment  None noted -Medications previously tried: none noted  -Last Hgb is normal at 12.0 -Recommended to continue current medication  Patient Goals/Self-Care Activities Patient will:  - take medications as prescribed focus on medication adherence by pill counts check blood pressure  periodically, document, and provide at future appointments  Follow Up Plan: The care management team will reach out to the patient again over the next 180 days.            Willa Frater, PharmD Clinical Pharmacist  [facility] [phone]

## 2022-11-20 NOTE — Progress Notes (Signed)
Care Management & Coordination Services Pharmacy Team  Reason for Encounter: Appointment Reminder  Contacted patient to confirm telephone appointment with Leata Mouse PharmD, on 11/23/2022 at 3 pm. Spoke with patient on 11/20/2022    Star Rating Drugs:  Atorvastatin last filled 11/16/2022 90 DS   Care Gaps: Annual wellness visit in last year? Yes  If Diabetic: Last eye exam / retinopathy screening: Never done Last diabetic foot exam: 09/16/2022   April D Calhoun, [place] Pharmacist Assistant [phone]

## 2022-11-23 ENCOUNTER — Ambulatory Visit: Payer: Medicare Other | Admitting: Pharmacist

## 2022-12-01 DIAGNOSIS — Z1231 Encounter for screening mammogram for malignant neoplasm of breast: Secondary | ICD-10-CM | POA: Diagnosis not present

## 2022-12-01 LAB — HM MAMMOGRAPHY

## 2022-12-07 ENCOUNTER — Encounter: Payer: Self-pay | Admitting: Gastroenterology

## 2022-12-19 ENCOUNTER — Other Ambulatory Visit: Payer: Self-pay | Admitting: Internal Medicine

## 2022-12-19 DIAGNOSIS — E0590 Thyrotoxicosis, unspecified without thyrotoxic crisis or storm: Secondary | ICD-10-CM

## 2022-12-21 ENCOUNTER — Other Ambulatory Visit: Payer: Self-pay

## 2022-12-21 ENCOUNTER — Other Ambulatory Visit: Payer: Self-pay | Admitting: Physician Assistant

## 2022-12-21 MED ORDER — EZETIMIBE 10 MG PO TABS: ORAL_TABLET | ORAL | 0 refills | Status: DC

## 2023-01-07 ENCOUNTER — Ambulatory Visit (INDEPENDENT_AMBULATORY_CARE_PROVIDER_SITE_OTHER): Payer: Medicare Other | Admitting: Family Medicine

## 2023-01-07 ENCOUNTER — Encounter: Payer: Self-pay | Admitting: Family Medicine

## 2023-01-07 VITALS — BP 102/64 | HR 67 | Temp 97.6°F | Ht 66.0 in | Wt 142.6 lb

## 2023-01-07 DIAGNOSIS — I5020 Unspecified systolic (congestive) heart failure: Secondary | ICD-10-CM

## 2023-01-07 DIAGNOSIS — I10 Essential (primary) hypertension: Secondary | ICD-10-CM

## 2023-01-07 DIAGNOSIS — E7800 Pure hypercholesterolemia, unspecified: Secondary | ICD-10-CM | POA: Diagnosis not present

## 2023-01-07 DIAGNOSIS — R569 Unspecified convulsions: Secondary | ICD-10-CM

## 2023-01-07 DIAGNOSIS — E0590 Thyrotoxicosis, unspecified without thyrotoxic crisis or storm: Secondary | ICD-10-CM

## 2023-01-07 DIAGNOSIS — E119 Type 2 diabetes mellitus without complications: Secondary | ICD-10-CM

## 2023-01-07 DIAGNOSIS — I25119 Atherosclerotic heart disease of native coronary artery with unspecified angina pectoris: Secondary | ICD-10-CM | POA: Diagnosis not present

## 2023-01-07 DIAGNOSIS — R350 Frequency of micturition: Secondary | ICD-10-CM

## 2023-01-07 LAB — POC URINALSYSI DIPSTICK (AUTOMATED)
Bilirubin, UA: NEGATIVE
Blood, UA: NEGATIVE
Glucose, UA: NEGATIVE
Ketones, UA: NEGATIVE
Leukocytes, UA: NEGATIVE
Nitrite, UA: NEGATIVE
Protein, UA: NEGATIVE
Spec Grav, UA: 1.025 (ref 1.010–1.025)
Urobilinogen, UA: 1 E.U./dL
pH, UA: 6 (ref 5.0–8.0)

## 2023-01-07 NOTE — Patient Instructions (Addendum)
Glad you are doing well overall  We will let you know within a few days about urine culture - let us know if worsening symptoms  Recommended follow up: Return in about 6 months (around 07/10/2023) for physical or sooner if needed.Schedule b4 you leave.

## 2023-01-07 NOTE — Addendum Note (Signed)
Addended by: Wyvonna Plum on: 01/07/2023 03:07 PM   Modules accepted: Orders

## 2023-01-07 NOTE — Progress Notes (Signed)
Phone [phone] In person visit   Subjective:   [name] is a 77 y.o. year old very pleasant female patient who presents for/with See problem oriented charting Chief Complaint  Patient presents with   Hypertension   Follow-up    (No Mask)   Urinary Frequency    Pt c/o frequent urination at night   Past Medical History-  Patient Active Problem List   Diagnosis Date Noted   WPW (Wolff-Parkinson-White syndrome) 03/05/2020    Priority: High   Heart failure with reduced ejection fraction ([place]) 08/21/2019    Priority: High   COVID-19 08/07/2019    Priority: High   Hyperthyroidism 05/22/2017    Priority: High   Migraine 09/19/2014    Priority: High   Diabetes mellitus type II, controlled ([place]) 09/19/2014    Priority: High   Coronary artery disease involving native coronary artery of native heart with angina pectoris ([place])     Priority: High   Seizures ([place])     Priority: High   Overactive bladder 09/19/2014    Priority: Medium    Hypercholesteremia     Priority: Medium    GERD (gastroesophageal reflux disease) 10/04/2011    Priority: Medium    HTN (hypertension) 10/01/2011    Priority: Medium    Aortic atherosclerosis ([place]) 01/09/2020    Priority: Low   Hypokalemia 06/01/2014    Priority: Low   Arthritis     Priority: Low   Pain in left wrist 07/17/2021   Chest pain 02/03/2020   Anemia 12/22/2017    Medications- reviewed and updated Current Outpatient Medications  Medication Sig Dispense Refill   acetaminophen (TYLENOL) 325 MG tablet Take 2 tablets (650 mg total) by mouth 4 (four) times daily. 30 tablet 0   amLODipine (NORVASC) 10 MG tablet TAKE 1 TABLET(10 MG) BY MOUTH DAILY. 30 tablet 0   ASPIRIN LOW DOSE PO Take 81 mg by mouth daily.     atorvastatin (LIPITOR) 40 MG tablet TAKE 1 TABLET(40 MG) BY MOUTH DAILY 90 tablet 3   Cholecalciferol (VITAMIN D3) 25 MCG (1000 UT) CAPS Take by mouth.     Cyanocobalamin (VITAMIN B-12) 3000 MCG SUBL Place 3,000 mg  under the tongue daily.     dicyclomine (BENTYL) 20 MG tablet Take 1 tablet (20 mg total) by mouth daily as needed for spasms. 20 tablet 0   esomeprazole (NEXIUM) 20 MG capsule TAKE 1 CAPSULE(20 MG) BY MOUTH DAILY 90 capsule 3   ezetimibe (ZETIA) 10 MG tablet TAKE 1 TABLET(10 MG) BY MOUTH DAILY 30 tablet 0   furosemide (LASIX) 20 MG tablet Take 1 tablet (20 mg total) by mouth daily. 90 tablet 3   isosorbide mononitrate (IMDUR) 120 MG 24 hr tablet Take 1 tablet (120 mg total) by mouth daily. 90 tablet 3   meclizine (ANTIVERT) 12.5 MG tablet Take 1 tablet (12.5 mg total) by mouth 3 (three) times daily as needed for dizziness. 30 tablet 0   methimazole (TAPAZOLE) 10 MG tablet TAKE 1 TABLET(10 MG) BY MOUTH TWICE DAILY 60 tablet 0   metoprolol succinate (TOPROL-XL) 25 MG 24 hr tablet TAKE 1 TABLET(25 MG) BY MOUTH DAILY 90 tablet 3   nitroGLYCERIN (NITROSTAT) 0.4 MG SL tablet PLACE 1 TABLET UNDER THE TONGUE EVERY 5 MINUTES FOR CHEST PAIN FOR UPTO 3 DOSES 25 tablet 3   oxybutynin (DITROPAN-XL) 10 MG 24 hr tablet TAKE 1 TABLET BY MOUTH EVERY DAY 90 tablet 3   potassium chloride (MICRO-K) 10 MEQ CR capsule Take 1  capsule (10 mEq total) by mouth 2 (two) times daily. 180 capsule 3   venlafaxine XR (EFFEXOR-XR) 150 MG 24 hr capsule TAKE 1 CAPSULE BY MOUTH AT BEDTIME 30 capsule 5   vitamin C (VITAMIN C) 500 MG tablet Take 1 tablet (500 mg total) by mouth 2 (two) times daily. 14 tablet 0   Vitamin E 180 MG CAPS Take 2 capsules by mouth daily.     zinc sulfate 220 (50 Zn) MG capsule Take 1 capsule (220 mg total) by mouth daily. 7 capsule 0   ondansetron (ZOFRAN) 4 MG tablet Take 1 tablet (4 mg total) by mouth every 6 (six) hours. (Patient not taking: Reported on 01/07/2023) 12 tablet 0   oxyCODONE-acetaminophen (PERCOCET) 10-325 MG tablet as needed. Taking as needed (Patient not taking: Reported on 01/07/2023)     No current facility-administered medications for this visit.     Objective:  BP 102/64   Pulse  67   Temp 97.6 F (36.4 C)   Ht '5\' 6"'$  (1.676 m)   Wt 142 lb 9.6 oz (64.7 kg)   SpO2 97%   BMI 23.02 kg/m  Gen: NAD, resting comfortably CV: RRR no murmurs rubs or gallops Lungs: CTAB no crackles, wheeze, rhonchi Abdomen: soft/nontender/nondistended/normal bowel sounds. No rebound or guarding. Reports abdomen doing better today Ext: no edema Skin: warm, dry     Assessment and Plan   # GI issues- has upcoming visit with Dr. Bryan Lemma- last time I saw her in early January was having lower abdominal pain- improved with UTI treatment and later with treatment of constipation. Lingered then worsened in late January- ended up also having a significant dizzy spell and going into the ED on 11/18/22- workup was reassuring- but they did think abdominal pain related to constipation again as had large stool burden  and recommended miralax- this helped and doing better now but would lstill like to see GI - seemed like she had vertigo that day - occasional dizziness but nothing like she had before- she wants to monitor.   #Concern for UTI S: Patients symptoms started to worsened starting a week ago.  Complains of dysuria: yes some pressure; polyuria: some increased in frequency; nocturia: yes but at baseline; urgency: yes- has to wear a pad.  Symptoms are stable.  ROS- no fever, chills, nausea, vomiting, flank pain (but does have more lower back pain). No blood in urine.  A/P: UA to be obtained with concern for UTI. Will get culture.  Patient to follow up if new or worsening symptoms or failure to improve.  --Oxybutynin could also contribute to constipation and UTIs-she declines to stop - still feels benefits outweigh risks for her personally- used to have more significant daytime issues  # Diabetes-new diagnosis 07/09/2022 S: Medication:Diet controlled Lab Results  Component Value Date   HGBA1C 6.6 (H) 11/02/2022   HGBA1C 6.5 07/09/2022   HGBA1C 6.5 07/10/2021   A/P: diabetes stable- continue  without medicines   #Nonobstructive CAD by cath in 2018 with angina thought primarily related to coronary vasospasm #hyperlipidemia #Aortic atherosclerosis S: Medication:Aspirin 81 mg, atorvastatin 40 mg (reported hot flashes and cramps on rosuvastatin 20 mg), Zetia 10 mg, Imdur 120 mg, metoprolol 25 mg extended release No recent chest pain as long as takes all meds Lab Results  Component Value Date   CHOL 117 07/09/2022   HDL 43.60 07/09/2022   LDLCALC 58 07/09/2022   LDLDIRECT 68 11/29/2020   TRIG 74.0 07/09/2022   CHOLHDL 3 07/09/2022  A/P: CAD asymptomatic as long as doesn't run out of pills- stable angina- continue current medications  Aortic atherosclerosis (presumed stable)- LDL goal ideally <70 - at goal in sept- continue current medications   #Heart failure with reduced ejection fraction.  Most recent ejection fraction improved to 55 to 60% in February 2021 S: Medication:Lasix 20 mg daily, also on potassium  Edema: minimal increase Weight gain:.stable in last few months Shortness of breath: no increse A/P: CHF stable- continue current medicines     #hypertension S: medication: Amlodipine 10 mg, Lasix 20 mg, Imdur 120 mg, metoprolol 25 mg extended release. Not lightheaded  BP Readings from Last 3 Encounters:  01/07/23 102/64  11/19/22 127/61  11/02/22 104/72  A/P: stable- continue current medicines . Blood pressure low normal but was high in December- with variability and no symptoms we opted to continue   #hyperthyroidism-follows with endocrine Dr. Kelton Pillar S: compliant On thyroid medication-methimazole 30 mg in the past-now down to 10 mg BID in sept 2023  and again today Lab Results  Component Value Date   TSH 0.39 09/16/2022  A/P:stable- continue current medicines  and follow up with endocrinology  #Migraines S: Medication:Effexor 150 mg extended release reportedly for headaches for years. A/P: gets some headaches still but more tolerable on medicine-  continue current medications    Seizures  S: Patient has reported history of seizures after injury in childhood following.  Had tolerated tramadol in the past for migraines without recurrent seizures.  No regular antiepileptic A/P: no recurrence- continue to monitor    #Overactive bladder S: Medication: Oxybutynin 10 mg extended  release.  We discussed risks of confusion/falls as well as constipatin and memory issues  A/P: she prefers to stay on medicine- finds very helpful   #lower back pain- working with pain management on oxycodone-hydrocodone - follow up with them -needs handicap placard for ongoing low back pain issues related to arthritis   Recommended follow up: No follow-ups on file. Future Appointments  Date Time Provider [place]  01/14/2023 11:00 AM Lavena Bullion, DO [facility]  05/28/2023  2:00 PM Edythe Clarity, [facility] None   Lab/Order associations:   ICD-10-CM   1. Urinary frequency  R35.0 POCT Urinalysis Dipstick (Automated)    Urine Culture    2. Controlled type 2 diabetes mellitus without complication, without long-term current use of insulin (HCC)  E11.9     3. Heart failure with reduced ejection fraction (HCC) Chronic I50.20     4. Coronary artery disease involving native coronary artery of native heart with angina pectoris ([place])  I25.119     5. Hyperthyroidism  E05.90     6. Seizures ([place])  R56.9     7. Primary hypertension  I10     8. Hypercholesteremia  E78.00      Return precautions advised.  Garret Reddish, MD

## 2023-01-08 ENCOUNTER — Other Ambulatory Visit: Payer: Self-pay | Admitting: Physician Assistant

## 2023-01-10 LAB — URINE CULTURE
MICRO NUMBER:: 14693247
SPECIMEN QUALITY:: ADEQUATE

## 2023-01-11 MED ORDER — CEPHALEXIN 500 MG PO CAPS: 500.0000 mg | ORAL_CAPSULE | Freq: Three times a day (TID) | ORAL | 0 refills | Status: AC

## 2023-01-11 NOTE — Addendum Note (Signed)
Addended by: Marin Olp on: 01/11/2023 10:45 AM   Modules accepted: Orders

## 2023-01-14 ENCOUNTER — Ambulatory Visit: Payer: Medicare Other | Admitting: Gastroenterology

## 2023-01-20 ENCOUNTER — Other Ambulatory Visit: Payer: Self-pay | Admitting: Physician Assistant

## 2023-01-20 ENCOUNTER — Other Ambulatory Visit: Payer: Self-pay

## 2023-01-20 DIAGNOSIS — E0590 Thyrotoxicosis, unspecified without thyrotoxic crisis or storm: Secondary | ICD-10-CM

## 2023-01-21 ENCOUNTER — Other Ambulatory Visit: Payer: Self-pay | Admitting: Physician Assistant

## 2023-01-21 ENCOUNTER — Other Ambulatory Visit: Payer: Self-pay

## 2023-01-21 MED ORDER — POTASSIUM CHLORIDE ER 10 MEQ PO CPCR: 10.0000 meq | ORAL_CAPSULE | Freq: Two times a day (BID) | ORAL | 0 refills | Status: DC

## 2023-01-21 NOTE — Telephone Encounter (Signed)
Rx(s) sent to pharmacy electronically.  

## 2023-01-27 ENCOUNTER — Other Ambulatory Visit: Payer: Self-pay | Admitting: *Deleted

## 2023-01-27 ENCOUNTER — Telehealth: Payer: Self-pay | Admitting: Physician Assistant

## 2023-01-27 ENCOUNTER — Telehealth: Payer: Self-pay | Admitting: Family Medicine

## 2023-01-27 MED ORDER — ISOSORBIDE MONONITRATE ER 120 MG PO TB24: 120.0000 mg | ORAL_TABLET | Freq: Every day | ORAL | 0 refills | Status: DC

## 2023-01-27 MED ORDER — OXYBUTYNIN CHLORIDE ER 10 MG PO TB24: 10.0000 mg | ORAL_TABLET | Freq: Every day | ORAL | 3 refills | Status: DC

## 2023-01-27 MED ORDER — FUROSEMIDE 20 MG PO TABS: 20.0000 mg | ORAL_TABLET | Freq: Every day | ORAL | 0 refills | Status: DC

## 2023-01-27 MED ORDER — ATORVASTATIN CALCIUM 40 MG PO TABS: ORAL_TABLET | ORAL | 0 refills | Status: DC

## 2023-01-27 MED ORDER — AMLODIPINE BESYLATE 10 MG PO TABS: ORAL_TABLET | ORAL | 0 refills | Status: DC

## 2023-01-27 MED ORDER — METOPROLOL SUCCINATE ER 25 MG PO TB24: 25.0000 mg | ORAL_TABLET | Freq: Every day | ORAL | 0 refills | Status: DC

## 2023-01-27 MED ORDER — EZETIMIBE 10 MG PO TABS: 10.0000 mg | ORAL_TABLET | Freq: Every day | ORAL | 0 refills | Status: DC

## 2023-01-27 NOTE — Addendum Note (Signed)
Addended by: Wyvonna Plum on: 01/27/2023 12:09 PM   Modules accepted: Orders

## 2023-01-27 NOTE — Telephone Encounter (Signed)
  Encourage patient to contact the pharmacy for refills or they can request refills through [facility] [place]:  01/07/2023 NEXT APPOINTMENT DATE:07/13/2023  MEDICATION:oxybutynin (DITROPAN-XL) 10 MG 24 hr tablet   Is the patient out of medication?   PHARMACY:[facility] [place], [place] Phone: [phone]  Fax: [phone]    Let patient know to contact pharmacy at the end of the day to make sure medication is ready.  Please notify patient to allow 48-72 hours to process

## 2023-01-27 NOTE — Telephone Encounter (Signed)
Refill sent to requested pharmacy.

## 2023-01-27 NOTE — Telephone Encounter (Signed)
Sent in 30 day supply refills to [facility].  Pt needs an appointment.

## 2023-01-27 NOTE — Telephone Encounter (Signed)
*  STAT* If patient is at the pharmacy, call can be transferred to refill team.   1. Which medications need to be refilled? (please list name of each medication and dose if known)  amLODipine (NORVASC) 10 MG tablet  ezetimibe (ZETIA) 10 MG tablet  furosemide (LASIX) 20 MG tablet   isosorbide mononitrate (IMDUR) 120 MG 24 hr tablet    metoprolol succinate (TOPROL-XL) 25 MG 24 hr tablet    atorvastatin (LIPITOR) 40 MG tablet    2. Which pharmacy/location (including street and city if local pharmacy) is medication to be sent to? [facility] - [place], [place]   3. Do they need a 30 day or 90 day supply? 90 day

## 2023-02-03 ENCOUNTER — Telehealth: Payer: Self-pay | Admitting: Pharmacist

## 2023-02-03 NOTE — Progress Notes (Signed)
Care Management & Coordination Services Pharmacy Team  Reason for Encounter: Hypertension  Contacted patient to discuss hypertension disease state. Spoke with patient on 02/03/2023     Current antihypertensive regimen:  Amlodipine 10 mg daily Furosemide 20 mg daily Metoprolol Succinate 25 mg daily  Patient verbally confirms she is taking the above medications as directed. Yes  How often are you checking your Blood Pressure? weekly  she checks her blood pressure in the middle of the day after taking her medication.  Current home BP readings: 130/64   Wrist or arm cuff: arm Caffeine intake: drinks soda and coffee Salt intake: limits salt  Any readings above 180/100? No If yes any symptoms of hypertensive emergency? patient denies any symptoms of high blood pressure  What recent interventions/DTPs have been made by any provider to improve Blood Pressure control since last CPP Visit: No recent interventions or DTPs.  Any recent hospitalizations or ED visits since last visit with CPP? No  What diet changes have been made to improve Blood Pressure Control?  No recent diet changes.  What exercise is being done to improve your Blood Pressure Control?  Doesn't exercise much due to back pain per patient.  Adherence Review: Is the patient currently on ACE/ARB medication? No Does the patient have >5 day gap between last estimated fill dates? No  -Patient states she has not had anymore syncopal episodes.  Star Rating Drugs:  Atorvastatin 40 mg last filled 11/16/2022 90 DS   Chart Updates: Recent office visits:  01/07/2023 OV (PCP) Shelva Majestic, MD; no medication changes indicated.  Recent consult visits:  None since last PharmD visit  Hospital visits:  None since last PharmD visit  Medications: Outpatient Encounter Medications as of 02/03/2023  Medication Sig   acetaminophen (TYLENOL) 325 MG tablet Take 2 tablets (650 mg total) by mouth 4 (four) times daily.    amLODipine (NORVASC) 10 MG tablet TAKE 1 TABLET(10 MG) BY MOUTH DAILY.   ASPIRIN LOW DOSE PO Take 81 mg by mouth daily.   atorvastatin (LIPITOR) 40 MG tablet TAKE 1 TABLET(40 MG) BY MOUTH DAILY   Cholecalciferol (VITAMIN D3) 25 MCG (1000 UT) CAPS Take by mouth.   Cyanocobalamin (VITAMIN B-12) 3000 MCG SUBL Place 3,000 mg under the tongue daily.   dicyclomine (BENTYL) 20 MG tablet Take 1 tablet (20 mg total) by mouth daily as needed for spasms.   esomeprazole (NEXIUM) 20 MG capsule TAKE 1 CAPSULE(20 MG) BY MOUTH DAILY   ezetimibe (ZETIA) 10 MG tablet Take 1 tablet (10 mg total) by mouth daily. NEED APPOINTMENT   furosemide (LASIX) 20 MG tablet Take 1 tablet (20 mg total) by mouth daily.   isosorbide mononitrate (IMDUR) 120 MG 24 hr tablet Take 1 tablet (120 mg total) by mouth daily.   meclizine (ANTIVERT) 12.5 MG tablet Take 1 tablet (12.5 mg total) by mouth 3 (three) times daily as needed for dizziness.   methimazole (TAPAZOLE) 10 MG tablet TAKE 1 TABLET(10 MG) BY MOUTH TWICE DAILY   metoprolol succinate (TOPROL-XL) 25 MG 24 hr tablet Take 1 tablet (25 mg total) by mouth daily. 1ST ATTEMPT, PT IS OVERDUE FOR AN APPT. PLEASE HAVE PT CALL TO SCHEDULE. CAN ONLY OK 30 DAYS SUPPLY   nitroGLYCERIN (NITROSTAT) 0.4 MG SL tablet PLACE 1 TABLET UNDER THE TONGUE EVERY 5 MINUTES FOR CHEST PAIN FOR UPTO 3 DOSES   ondansetron (ZOFRAN) 4 MG tablet Take 1 tablet (4 mg total) by mouth every 6 (six) hours. (Patient not taking: Reported  on 01/07/2023)   oxybutynin (DITROPAN-XL) 10 MG 24 hr tablet Take 1 tablet (10 mg total) by mouth daily.   oxyCODONE-acetaminophen (PERCOCET) 10-325 MG tablet as needed. Taking as needed (Patient not taking: Reported on 01/07/2023)   potassium chloride (MICRO-K) 10 MEQ CR capsule Take 1 capsule (10 mEq total) by mouth 2 (two) times daily.   venlafaxine XR (EFFEXOR-XR) 150 MG 24 hr capsule TAKE 1 CAPSULE BY MOUTH AT BEDTIME   vitamin C (VITAMIN C) 500 MG tablet Take 1 tablet (500 mg  total) by mouth 2 (two) times daily.   Vitamin E 180 MG CAPS Take 2 capsules by mouth daily.   zinc sulfate 220 (50 Zn) MG capsule Take 1 capsule (220 mg total) by mouth daily.   No facility-administered encounter medications on file as of 02/03/2023.    Recent Office Vitals: BP Readings from Last 3 Encounters:  01/07/23 102/64  11/19/22 127/61  11/02/22 104/72   Pulse Readings from Last 3 Encounters:  01/07/23 67  11/19/22 73  11/02/22 81    Wt Readings from Last 3 Encounters:  01/07/23 142 lb 9.6 oz (64.7 kg)  11/02/22 141 lb 6.4 oz (64.1 kg)  09/30/22 145 lb 8 oz (66 kg)     Kidney Function Lab Results  Component Value Date/Time   CREATININE 1.13 (H) 11/18/2022 01:26 PM   CREATININE 1.14 11/02/2022 04:47 PM   CREATININE 1.08 (H) 11/29/2020 02:31 PM   CREATININE 0.79 06/04/2017 04:59 PM   GFR 46.92 (L) 11/02/2022 04:47 PM   GFRNONAA 50 (L) 11/18/2022 01:26 PM   GFRAA 56 (L) 02/04/2020 02:57 AM       Latest Ref Rng & Units 11/18/2022    1:26 PM 11/02/2022    4:47 PM 10/20/2022    4:22 PM  BMP  Glucose 70 - 99 mg/dL 99  98  99   BUN 8 - 23 mg/dL 14  11  17    Creatinine 0.44 - 1.00 mg/dL 5.46  2.70  3.50   Sodium 135 - 145 mmol/L 137  138  140   Potassium 3.5 - 5.1 mmol/L 4.1  3.9  3.7   Chloride 98 - 111 mmol/L 105  103  106   CO2 22 - 32 mmol/L 25  28  24    Calcium 8.9 - 10.3 mg/dL 9.4  9.4  09.3      Future Appointments  Date Time Provider Department Center  03/18/2023  2:20 PM Shellia Cleverly, DO [facility]  05/28/2023  2:00 PM Erroll Luna, [facility] None  07/13/2023  3:00 PM Shelva Majestic, MD [facility]   April D Calhoun, [facility] Clinical Pharmacist Assistant [phone]

## 2023-02-15 ENCOUNTER — Telehealth: Payer: Self-pay

## 2023-02-15 NOTE — Telephone Encounter (Signed)
Contact patient to schedule follow up appointment. Patient  no showed last appointment on 08/26/22.

## 2023-02-15 NOTE — Telephone Encounter (Signed)
LMx1 to reschedule previously missed appointment.

## 2023-02-19 ENCOUNTER — Other Ambulatory Visit: Payer: Self-pay | Admitting: Physician Assistant

## 2023-02-27 ENCOUNTER — Other Ambulatory Visit: Payer: Self-pay | Admitting: Physician Assistant

## 2023-03-01 ENCOUNTER — Other Ambulatory Visit: Payer: Self-pay

## 2023-03-01 MED ORDER — METOPROLOL SUCCINATE ER 25 MG PO TB24: 25.0000 mg | ORAL_TABLET | Freq: Every day | ORAL | 0 refills | Status: DC

## 2023-03-01 NOTE — Telephone Encounter (Signed)
Previous pt of Dr. Katrinka Blazing. Looks like they will be seeing Dr. Izora Ribas going forward? Please review for refill. Thank you!

## 2023-03-03 ENCOUNTER — Other Ambulatory Visit: Payer: Self-pay

## 2023-03-03 MED ORDER — ISOSORBIDE MONONITRATE ER 120 MG PO TB24: 120.0000 mg | ORAL_TABLET | Freq: Every day | ORAL | 0 refills | Status: DC

## 2023-03-03 MED ORDER — EZETIMIBE 10 MG PO TABS: 10.0000 mg | ORAL_TABLET | Freq: Every day | ORAL | 0 refills | Status: DC

## 2023-03-03 MED ORDER — AMLODIPINE BESYLATE 10 MG PO TABS: ORAL_TABLET | ORAL | 0 refills | Status: DC

## 2023-03-03 MED ORDER — METOPROLOL SUCCINATE ER 25 MG PO TB24: 25.0000 mg | ORAL_TABLET | Freq: Every day | ORAL | 0 refills | Status: DC

## 2023-03-03 NOTE — Telephone Encounter (Signed)
Pt's medications were sent to pt's pharmacy as requested. Confirmation received.  

## 2023-03-12 ENCOUNTER — Other Ambulatory Visit: Payer: Self-pay | Admitting: Internal Medicine

## 2023-03-18 ENCOUNTER — Ambulatory Visit: Payer: Medicare Other | Admitting: Gastroenterology

## 2023-03-20 ENCOUNTER — Other Ambulatory Visit: Payer: Self-pay | Admitting: Physician Assistant

## 2023-03-25 ENCOUNTER — Other Ambulatory Visit: Payer: Self-pay | Admitting: Physician Assistant

## 2023-03-25 ENCOUNTER — Other Ambulatory Visit: Payer: Self-pay | Admitting: Internal Medicine

## 2023-03-25 NOTE — Telephone Encounter (Signed)
Former pt of Dr. Katrinka Blazing, overdue for 1 year follow-up appointment. I am not sure who this pt needs to be scheduled with. Looks like last saw Jari Favre, [place].

## 2023-03-26 ENCOUNTER — Telehealth: Payer: Self-pay | Admitting: Family Medicine

## 2023-03-26 ENCOUNTER — Ambulatory Visit: Payer: Medicare Other | Admitting: Family Medicine

## 2023-03-26 NOTE — Telephone Encounter (Signed)
Pt was a no show for an OV with Dr Doreene Burke on 03/26/23, I sent a letter.

## 2023-03-27 ENCOUNTER — Other Ambulatory Visit: Payer: Self-pay | Admitting: Physician Assistant

## 2023-03-30 ENCOUNTER — Other Ambulatory Visit: Payer: Self-pay | Admitting: Physician Assistant

## 2023-03-30 NOTE — Telephone Encounter (Signed)
Former pt of Dr. Katrinka Blazing.. Not yet scheduled with a different provider. Message sent last month stating pt is due for appointment. Routing to [facility] refill pool as well as scheduling team.

## 2023-04-01 NOTE — Telephone Encounter (Signed)
Fee waived, [facility] pt

## 2023-04-06 ENCOUNTER — Encounter (HOSPITAL_COMMUNITY): Payer: Self-pay

## 2023-04-06 ENCOUNTER — Ambulatory Visit (HOSPITAL_COMMUNITY)
Admission: EM | Admit: 2023-04-06 | Discharge: 2023-04-06 | Disposition: A | Discharge status: Home / Self Care | Payer: Medicare Other | Attending: Emergency Medicine | Admitting: Emergency Medicine

## 2023-04-06 DIAGNOSIS — R3915 Urgency of urination: Secondary | ICD-10-CM | POA: Diagnosis not present

## 2023-04-06 DIAGNOSIS — R42 Dizziness and giddiness: Secondary | ICD-10-CM | POA: Insufficient documentation

## 2023-04-06 LAB — POCT URINALYSIS DIP (MANUAL ENTRY)
Bilirubin, UA: NEGATIVE
Glucose, UA: NEGATIVE mg/dL
Ketones, POC UA: NEGATIVE mg/dL
Nitrite, UA: NEGATIVE
Protein Ur, POC: 30 mg/dL — AB
Spec Grav, UA: 1.02 (ref 1.010–1.025)
Urobilinogen, UA: 4 E.U./dL — AB
pH, UA: 6 (ref 5.0–8.0)

## 2023-04-06 LAB — CBC
HCT: 33.5 % — ABNORMAL LOW (ref 36.0–46.0)
Hemoglobin: 11.1 g/dL — ABNORMAL LOW (ref 12.0–15.0)
MCH: 31 pg (ref 26.0–34.0)
MCHC: 33.1 g/dL (ref 30.0–36.0)
MCV: 93.6 fL (ref 80.0–100.0)
Platelets: 251 10*3/uL (ref 150–400)
RBC: 3.58 MIL/uL — ABNORMAL LOW (ref 3.87–5.11)
RDW: 13.8 % (ref 11.5–15.5)
WBC: 5.5 10*3/uL (ref 4.0–10.5)
nRBC: 0 % (ref 0.0–0.2)

## 2023-04-06 LAB — BASIC METABOLIC PANEL
Anion gap: 10 (ref 5–15)
BUN: 11 mg/dL (ref 8–23)
CO2: 24 mmol/L (ref 22–32)
Calcium: 9 mg/dL (ref 8.9–10.3)
Chloride: 103 mmol/L (ref 98–111)
Creatinine, Ser: 1.05 mg/dL — ABNORMAL HIGH (ref 0.44–1.00)
GFR, Estimated: 55 mL/min — ABNORMAL LOW (ref >60)
Glucose, Bld: 91 mg/dL (ref 70–99)
Potassium: 3.1 mmol/L — ABNORMAL LOW (ref 3.5–5.1)
Sodium: 137 mmol/L (ref 135–145)

## 2023-04-06 LAB — POCT FASTING CBG KUC MANUAL ENTRY: POCT Glucose (KUC): 101 mg/dL — AB (ref 70–99)

## 2023-04-06 NOTE — ED Triage Notes (Signed)
Pt states in the past 2 wks she has had intermittent dizziness, lt shoulder pain, chills with sweating, body aches, and back pain. States taking her percocet with relief with pain but the dizziness and staggering. Denies chest pain or SOB. Pt speaking in complete sentences.

## 2023-04-06 NOTE — Discharge Instructions (Addendum)
We will call you tomorrow morning if anything abnormal results on your blood work.   Please keep your appointment with your primary care provider for next week. You will need to follow up with them regarding your symptoms.  If you feel your symptoms worsen or become severe, you need to go directly to the emergency department. Do not return to the urgent care as there is no imaging or testing we can do to further evaluate your concerns.

## 2023-04-06 NOTE — ED Provider Notes (Signed)
[facility]    CSN: [id number] Arrival date & time: 04/06/23  1754     History   Chief Complaint Chief Complaint  Patient presents with   Dizziness    HPI [name] is a 77 y.o. female.  Here with multiple complaints and concerns. Patient is a poor historian and I am not really sure what is bothering her the most. It seems she is most concerned about the intermittent dizziness over the last 2 weeks. When she walks she feels like she is "drunk". She does not have dizziness at rest or with movement of head. Denies sensation of room spinning.  Was having some body aches but it sounds like those have resolved now.  Not having fever or chills although skin was hot the other day  Denies headache, congestion, cough, sore throat, chest pain, shortness of breath, abdominal pain, vomiting/diarrhea, rash, weakness, numbness/tingling. Not having urinary symptoms.  No new medications or recent travel Denies history of this  She denies any drug or alcohol use, although had 75 tablets of Percocet prescribed 2 weeks ago and reports using this with some relief.   She has history of HTN, seizures, CAD, overactive bladder, migraine, DM2, hyperthyroid, HF  She has a primary care visit in 1 week   Past Medical History:  Diagnosis Date   Anemia 12/22/2017   Arthritis    Chronic lower back pain    Cluster headache    Coronary artery disease    a. 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 non obs dz LHC 2007. c. Non obs dz (mild-mod) by [facility] 05/17/14 d. cath 05/26/17 mild non obstructive CAD   COVID-19 08/07/2019   Diverticulitis    a. Hx microperf 2012 - hospitalizated.   GERD (gastroesophageal reflux disease)    Hypercholesteremia    Hypertension    Overactive bladder 09/19/2014   Oxybutynin 5mg  XL--> 10mg    PVC's (premature ventricular contractions)    a. Hx of trigeminy/bigeminy.   Seizures (HCC)    Thyroid disease    hyperthyroid   UTI (lower urinary tract infection)     Patient Active  Problem List   Diagnosis Date Noted   Pain in left wrist 07/17/2021   WPW (Wolff-Parkinson-White syndrome) 03/05/2020   Chest pain 02/03/2020   Aortic atherosclerosis (HCC) 01/09/2020   Heart failure with reduced ejection fraction (HCC) 08/21/2019   COVID-19 08/07/2019   Anemia 12/22/2017   Hyperthyroidism 05/22/2017   Overactive bladder 09/19/2014   Migraine 09/19/2014   Diabetes mellitus type II, controlled (HCC) 09/19/2014   Hypokalemia 06/01/2014   Coronary artery disease involving native coronary artery of native heart with angina pectoris (HCC)    Seizures (HCC)    Arthritis    Hypercholesteremia    GERD (gastroesophageal reflux disease) 10/04/2011   HTN (hypertension) 10/01/2011    Past Surgical History:  Procedure Laterality Date   ABDOMINAL HYSTERECTOMY  1987   "partial"-still has ovaries   CARDIAC CATHETERIZATION  05/17/2014   CORONARY ANGIOPLASTY WITH STENT PLACEMENT  1995   "1"   JOINT REPLACEMENT     right knee   LEFT HEART CATH AND CORONARY ANGIOGRAPHY N/A 05/26/2017   Procedure: Left Heart Cath and Coronary Angiography;  Surgeon: Corky Crafts, MD;  Location: [facility] INVASIVE CV LAB;  Service: Cardiovascular;  Laterality: N/A;   LEFT HEART CATHETERIZATION WITH CORONARY ANGIOGRAM N/A 05/17/2014   Procedure: LEFT HEART CATHETERIZATION WITH CORONARY ANGIOGRAM;  Surgeon: Marykay Lex, MD;  Location: [facility] CATH LAB;  Service: Cardiovascular;  Laterality: N/A;   TOTAL KNEE ARTHROPLASTY Right 2005   TUBAL LIGATION  1970   VASCULAR SURGERY      OB History   No obstetric history on file.      Home Medications    Prior to Admission medications   Medication Sig Start Date End Date Taking? Authorizing Provider  oxyCODONE-acetaminophen (PERCOCET) 10-325 MG tablet as needed. Taking as needed 12/19/21  Yes [provider]  acetaminophen (TYLENOL) 325 MG tablet Take 2 tablets (650 mg total) by mouth 4 (four) times daily. 06/07/22   Mesner, Barbara Cower, MD  amLODipine  (NORVASC) 10 MG tablet TAKE 1 TABLET(10 MG) BY MOUTH DAILY. 03/03/23   Sharlene Dory, PA-C  ASPIRIN LOW DOSE PO Take 81 mg by mouth daily.    [provider]  atorvastatin (LIPITOR) 40 MG tablet TAKE ONE TABLET (40 MG) BY MOUTH DAILY AT 5 PM. PATIENT NEEDS APPOINTMENT FOR FURTHER REFILLS-3rd attempt 03/29/23   Riley Lam A, MD  Cholecalciferol (VITAMIN D3) 25 MCG (1000 UT) CAPS Take by mouth.    [provider]  Cyanocobalamin (VITAMIN B-12) 3000 MCG SUBL Place 3,000 mg under the tongue daily.    [provider]  dicyclomine (BENTYL) 20 MG tablet Take 1 tablet (20 mg total) by mouth daily as needed for spasms. 10/21/22   Carroll Sage, PA-C  esomeprazole (NEXIUM) 20 MG capsule TAKE 1 CAPSULE(20 MG) BY MOUTH DAILY 07/14/22   Shelva Majestic, MD  ezetimibe (ZETIA) 10 MG tablet TAKE ONE TABLET (10MG  TOTAL) BY MOUTH DAILY 03/25/23   Sharlene Dory, PA-C  furosemide (LASIX) 20 MG tablet TAKE ONE TABLET (20MG  TOTAL) BY MOUTH DAILY AT 9 AM 03/25/23   Chandrasekhar, Mahesh A, MD  isosorbide mononitrate (IMDUR) 120 MG 24 hr tablet TAKE ONE TABLET (120 MG TOTAL) BY MOUTH DAILY AT 9 AM (NEED APPOINTMENT FOR FURTHER REFILLS) 03/12/23   Sharlene Dory, PA-C  meclizine (ANTIVERT) 12.5 MG tablet Take 1 tablet (12.5 mg total) by mouth 3 (three) times daily as needed for dizziness. 11/19/22   Carroll Sage, PA-C  methimazole (TAPAZOLE) 10 MG tablet TAKE 1 TABLET(10 MG) BY MOUTH TWICE DAILY 12/20/22   Shamleffer, Konrad Dolores, MD  metoprolol succinate (TOPROL-XL) 25 MG 24 hr tablet Take 1 tablet (25 mg total) by mouth daily. 03/03/23   Sharlene Dory, PA-C  nitroGLYCERIN (NITROSTAT) 0.4 MG SL tablet PLACE 1 TABLET UNDER THE TONGUE EVERY 5 MINUTES FOR CHEST PAIN FOR UPTO 3 DOSES 01/06/22   Sharlene Dory, PA-C  ondansetron (ZOFRAN) 4 MG tablet Take 1 tablet (4 mg total) by mouth every 6 (six) hours. Patient not taking: Reported on 01/07/2023 10/21/22   Carroll Sage, PA-C   oxybutynin (DITROPAN-XL) 10 MG 24 hr tablet Take 1 tablet (10 mg total) by mouth daily. 01/27/23   Shelva Majestic, MD  potassium chloride (MICRO-K) 10 MEQ CR capsule TAKE 1 CAPSULE(10 MEQ) BY MOUTH TWICE DAILY 03/30/23   Sharlene Dory, PA-C  venlafaxine XR (EFFEXOR-XR) 150 MG 24 hr capsule TAKE 1 CAPSULE BY MOUTH AT BEDTIME 11/16/22   Shelva Majestic, MD  vitamin C (VITAMIN C) 500 MG tablet Take 1 tablet (500 mg total) by mouth 2 (two) times daily. 08/08/19   Burnadette Pop, MD  Vitamin E 180 MG CAPS Take 2 capsules by mouth daily.    [provider]  zinc sulfate 220 (50 Zn) MG capsule Take 1 capsule (220 mg total) by mouth daily. 08/09/19  Burnadette Pop, MD    Family History Family History  Problem Relation Age of Onset   CAD Brother    Diabetes Brother    CAD Father    Diabetes Father    Diabetes Mother        father, sister, brothers   Diabetes Sister    Thyroid disease Sister    Heart attack Brother    Prostate cancer Brother    Thyroid disease Sister    Diabetes Sister     Social History Social History   Tobacco Use   Smoking status: Never   Smokeless tobacco: Never  Vaping Use   Vaping Use: Never used  Substance Use Topics   Alcohol use: No   Drug use: No     Allergies   Crestor [rosuvastatin] and Sulfa drugs cross reactors   Review of Systems Review of Systems As per HPI  Physical Exam Triage Vital Signs ED Triage Vitals  Enc Vitals Group     BP 04/06/23 1815 (!) 143/80     Pulse Rate 04/06/23 1815 73     Resp 04/06/23 1815 18     Temp 04/06/23 1815 98.9 F (37.2 C)     Temp Source 04/06/23 1815 Oral     SpO2 04/06/23 1816 96 %     Weight --      Height --      Head Circumference --      Peak Flow --      Pain Score --      Pain Loc --      Pain Edu? --      Excl. in GC? --    No data found.  Updated Vital Signs BP (!) 143/80 (BP Location: Left Arm)   Pulse 73   Temp 98.9 F (37.2 C) (Oral)   Resp 18   SpO2 96%     Physical Exam Vitals and nursing note reviewed.  Constitutional:      General: She is not in acute distress.    Appearance: She is not ill-appearing.  HENT:     Head: Normocephalic and atraumatic.     Mouth/Throat:     Mouth: Mucous membranes are moist.     Pharynx: Oropharynx is clear.  Eyes:     General: Vision grossly intact. Gaze aligned appropriately.     Extraocular Movements: Extraocular movements intact.     Right eye: Normal extraocular motion and no nystagmus.     Left eye: Normal extraocular motion and no nystagmus.     Conjunctiva/sclera: Conjunctivae normal.     Pupils: Pupils are equal, round, and reactive to light.  Neck:     Trachea: Phonation normal.  Cardiovascular:     Rate and Rhythm: Normal rate and regular rhythm.     Pulses: Normal pulses.          Radial pulses are 2+ on the right side and 2+ on the left side.     Heart sounds: Normal heart sounds.  Pulmonary:     Effort: Pulmonary effort is normal.     Breath sounds: Normal breath sounds.  Abdominal:     Palpations: Abdomen is soft.     Tenderness: There is no abdominal tenderness. There is no right CVA tenderness, left CVA tenderness, guarding or rebound.  Musculoskeletal:        General: Normal range of motion.     Cervical back: Normal range of motion. No rigidity. Normal range of motion.     Right  lower leg: No edema.     Left lower leg: No edema.     Comments: No bony tenderness C-L spine  Skin:    General: Skin is warm and dry.  Neurological:     General: No focal deficit present.     Mental Status: She is alert and oriented to person, place, and time.     Cranial Nerves: Cranial nerves 2-12 are intact. No cranial nerve deficit or facial asymmetry.     Sensory: Sensation is intact. No sensory deficit.     Motor: Motor function is intact. No weakness.     Coordination: Coordination is intact.     Gait: Gait is intact. Gait normal.     Comments: Strength and sensation intact throughout       UC Treatments / Results  Labs (all labs ordered are listed, but only abnormal results are displayed) Labs Reviewed  POCT URINALYSIS DIP (MANUAL ENTRY) - Abnormal; Notable for the following components:      Result Value   Blood, UA small (*)    Protein Ur, POC =30 (*)    Urobilinogen, UA 4.0 (*)    Leukocytes, UA Trace (*)    All other components within normal limits  POCT FASTING CBG [facility] MANUAL ENTRY - Abnormal; Notable for the following components:   POCT Glucose ([facility]) 101 (*)    All other components within normal limits  URINE CULTURE  CBC  BASIC METABOLIC PANEL    EKG  Radiology No results found.  Procedures Procedures (including critical care time)  Medications Ordered in UC Medications - No data to display  Initial Impression / Assessment and Plan / UC Course  I have reviewed the triage vital signs and the nursing notes.  Pertinent labs & imaging results that were available during my care of the patient were reviewed by me and considered in my medical decision making (see chart for details).  UA with small RBC, trace leuks. She is not having urinary symptoms at this time. Will culture and treat positive result as indicated. CBG 101. CBC and CMP are pending.  Neurologically intact and physical exam is unremarkable. No red flags at this time.  She has primary care visit scheduled for 1 week from today and will follow with them regarding symptoms. Discussed with any acute change or worsening she needs to be evaluated in the emergency department.   Final Clinical Impressions(s) / UC Diagnoses   Final diagnoses:  Dizziness     Discharge Instructions      We will call you tomorrow morning if anything abnormal results on your blood work.   Please keep your appointment with your primary care provider for next week. You will need to follow up with them regarding your symptoms.  If you feel your symptoms worsen or become severe, you need to go directly to the  emergency department. Do not return to the urgent care as there is no imaging or testing we can do to further evaluate your concerns.      ED Prescriptions   None    I have reviewed the PDMP during this encounter.   Kathrine Haddock 04/06/23 1926

## 2023-04-07 ENCOUNTER — Other Ambulatory Visit: Payer: Self-pay | Admitting: Physician Assistant

## 2023-04-08 ENCOUNTER — Telehealth (HOSPITAL_COMMUNITY): Payer: Self-pay

## 2023-04-08 LAB — URINE CULTURE: Culture: >=100000 — AB

## 2023-04-08 MED ORDER — CEPHALEXIN 500 MG PO CAPS: 500.0000 mg | ORAL_CAPSULE | Freq: Two times a day (BID) | ORAL | 0 refills | Status: AC

## 2023-04-08 NOTE — Telephone Encounter (Signed)
Patient calling in about lab results. Looks as though blood work was reviewed by ordering provider and deemed normal.   Urine culture is positive for E.Coli. message sent to providers on staff for next steps.

## 2023-04-08 NOTE — Telephone Encounter (Signed)
Spoke with provider Rennis Chris via secure chat.   "She can have keflex 500mg  twice a day for 7 days. Are you able to call this in for her via telephone visit?"   Medication sent.   Patient informed and verbalized understanding.

## 2023-04-09 ENCOUNTER — Telehealth (HOSPITAL_COMMUNITY): Payer: Self-pay | Admitting: Emergency Medicine

## 2023-04-09 MED ORDER — POTASSIUM CHLORIDE ER 10 MEQ PO TBCR: 10.0000 meq | EXTENDED_RELEASE_TABLET | Freq: Two times a day (BID) | ORAL | 0 refills | Status: DC

## 2023-04-09 NOTE — Telephone Encounter (Signed)
Called patient, discussed low potassium (3.1) Sent potassium chloride 10 mEq to use BID x 5 days.  She does have a follow up with her primary care in 4 days on 6/18. Patient with no questions at this time.

## 2023-04-11 ENCOUNTER — Other Ambulatory Visit: Payer: Self-pay | Admitting: Internal Medicine

## 2023-04-13 ENCOUNTER — Ambulatory Visit (INDEPENDENT_AMBULATORY_CARE_PROVIDER_SITE_OTHER): Payer: Medicare Other

## 2023-04-13 ENCOUNTER — Other Ambulatory Visit: Payer: Self-pay | Admitting: Internal Medicine

## 2023-04-13 VITALS — Wt 142.0 lb

## 2023-04-13 DIAGNOSIS — Z0000 Encounter for general adult medical examination without abnormal findings: Secondary | ICD-10-CM

## 2023-04-13 NOTE — Progress Notes (Signed)
I connected with  [name] on 04/13/23 by a audio enabled telemedicine application and verified that I am speaking with the correct person using two identifiers.  Patient Location: Home  Provider Location: Office/Clinic  I discussed the limitations of evaluation and management by telemedicine. The patient expressed understanding and agreed to proceed.  Subjective:   [name] is a 77 y.o. female who presents for Medicare Annual (Subsequent) preventive examination.  In person visit  Patient Location: Other:  office  Provider Location: Office/Clinic  I discussed the limitations of evaluation and management by telemedicine. The patient expressed understanding and agreed to proceed.   Review of Systems     Cardiac Risk Factors include: advanced age (>47men, >65 women);hypertension;dyslipidemia     Objective:    Today's Vitals   04/13/23 0943  Weight: 142 lb (64.4 kg)   Body mass index is 22.92 kg/m.     04/13/2023    9:49 AM 11/18/2022    1:26 PM 06/07/2022   12:09 AM 05/12/2022   10:05 AM 02/06/2021    2:18 PM 02/03/2020    1:18 PM 02/03/2020   12:09 PM  Advanced Directives  Does Patient Have a Medical Advance Directive? No No No No No No No  Would patient like information on creating a medical advance directive? No - Patient declined No - Patient declined Yes (ED - Information included in AVS) No - Patient declined Yes (MAU/Ambulatory/Procedural Areas - Information given)  No - Patient declined;Yes (ED - Information included in AVS)    Current Medications (verified) Outpatient Encounter Medications as of 04/13/2023  Medication Sig   acetaminophen (TYLENOL) 325 MG tablet Take 2 tablets (650 mg total) by mouth 4 (four) times daily.   amLODipine (NORVASC) 10 MG tablet TAKE 1 TABLET(10 MG) BY MOUTH DAILY.   aspirin EC 325 MG tablet Take 325 mg by mouth every other day.   cephALEXin (KEFLEX) 500 MG capsule Take 1 capsule (500 mg total) by mouth 2 (two) times daily for 7  days.   Cholecalciferol (VITAMIN D3) 25 MCG (1000 UT) CAPS Take by mouth.   Cyanocobalamin (VITAMIN B-12) 3000 MCG SUBL Place 3,000 mg under the tongue daily.   dicyclomine (BENTYL) 20 MG tablet Take 1 tablet (20 mg total) by mouth daily as needed for spasms.   esomeprazole (NEXIUM) 20 MG capsule TAKE 1 CAPSULE(20 MG) BY MOUTH DAILY   ezetimibe (ZETIA) 10 MG tablet TAKE ONE TABLET (10MG  TOTAL) BY MOUTH DAILY   furosemide (LASIX) 20 MG tablet TAKE ONE TABLET (20MG  TOTAL) BY MOUTH DAILY AT 9 AM   isosorbide mononitrate (IMDUR) 120 MG 24 hr tablet TAKE ONE TABLET (120 MG TOTAL) BY MOUTH DAILY AT 9 AM (NEED APPOINTMENT FOR FURTHER REFILLS)   meclizine (ANTIVERT) 12.5 MG tablet Take 1 tablet (12.5 mg total) by mouth 3 (three) times daily as needed for dizziness.   methimazole (TAPAZOLE) 10 MG tablet TAKE 1 TABLET(10 MG) BY MOUTH TWICE DAILY   metoprolol succinate (TOPROL-XL) 25 MG 24 hr tablet Take 1 tablet (25 mg total) by mouth daily.   nitroGLYCERIN (NITROSTAT) 0.4 MG SL tablet PLACE 1 TABLET UNDER THE TONGUE EVERY 5 MINUTES FOR CHEST PAIN FOR UPTO 3 DOSES (Patient taking differently: PLACE 1 TABLET UNDER THE TONGUE EVERY 5 MINUTES FOR CHEST PAIN FOR UPTO 3 DOSES, keep on hand)   oxybutynin (DITROPAN-XL) 10 MG 24 hr tablet Take 1 tablet (10 mg total) by mouth daily.   oxyCODONE-acetaminophen (PERCOCET) 10-325 MG tablet as needed. Taking as  needed   potassium chloride (KLOR-CON) 10 MEQ tablet Take 1 tablet (10 mEq total) by mouth 2 (two) times daily for 5 days.   venlafaxine XR (EFFEXOR-XR) 150 MG 24 hr capsule TAKE 1 CAPSULE BY MOUTH AT BEDTIME   vitamin C (VITAMIN C) 500 MG tablet Take 1 tablet (500 mg total) by mouth 2 (two) times daily.   Vitamin E 180 MG CAPS Take 2 capsules by mouth daily.   zinc sulfate 220 (50 Zn) MG capsule Take 1 capsule (220 mg total) by mouth daily.   atorvastatin (LIPITOR) 40 MG tablet TAKE ONE TABLET BY MOUTH DAILY AT 5 PM. PATIENT NEEDS APPOINTMENT BEFORE NEXT REFILL  (Patient not taking: Reported on 04/13/2023)   [DISCONTINUED] ASPIRIN LOW DOSE PO Take 325 mg by mouth daily.   [DISCONTINUED] ondansetron (ZOFRAN) 4 MG tablet Take 1 tablet (4 mg total) by mouth every 6 (six) hours.   No facility-administered encounter medications on file as of 04/13/2023.    Allergies (verified) Crestor [rosuvastatin] and Sulfa drugs cross reactors   History: Past Medical History:  Diagnosis Date   Anemia 12/22/2017   Arthritis    Chronic lower back pain    Cluster headache    Coronary artery disease    a. 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 non obs dz LHC 2007. c. Non obs dz (mild-mod) by [facility] 05/17/14 d. cath 05/26/17 mild non obstructive CAD   COVID-19 08/07/2019   Diverticulitis    a. Hx microperf 2012 - hospitalizated.   GERD (gastroesophageal reflux disease)    Hypercholesteremia    Hypertension    Overactive bladder 09/19/2014   Oxybutynin 5mg  XL--> 10mg    PVC's (premature ventricular contractions)    a. Hx of trigeminy/bigeminy.   Seizures (HCC)    Thyroid disease    hyperthyroid   UTI (lower urinary tract infection)    Past Surgical History:  Procedure Laterality Date   ABDOMINAL HYSTERECTOMY  1987   "partial"-still has ovaries   CARDIAC CATHETERIZATION  05/17/2014   CORONARY ANGIOPLASTY WITH STENT PLACEMENT  1995   "1"   JOINT REPLACEMENT     right knee   LEFT HEART CATH AND CORONARY ANGIOGRAPHY N/A 05/26/2017   Procedure: Left Heart Cath and Coronary Angiography;  Surgeon: Corky Crafts, MD;  Location: [facility] INVASIVE CV LAB;  Service: Cardiovascular;  Laterality: N/A;   LEFT HEART CATHETERIZATION WITH CORONARY ANGIOGRAM N/A 05/17/2014   Procedure: LEFT HEART CATHETERIZATION WITH CORONARY ANGIOGRAM;  Surgeon: Marykay Lex, MD;  Location: [facility] CATH LAB;  Service: Cardiovascular;  Laterality: N/A;   TOTAL KNEE ARTHROPLASTY Right 2005   TUBAL LIGATION  1970   VASCULAR SURGERY     Family History  Problem Relation Age of Onset   CAD Brother    Diabetes  Brother    CAD Father    Diabetes Father    Diabetes Mother        father, sister, brothers   Diabetes Sister    Thyroid disease Sister    Heart attack Brother    Prostate cancer Brother    Thyroid disease Sister    Diabetes Sister    Social History   Socioeconomic History   Marital status: Married    Spouse name: Not on file   Number of children: 4   Years of education: 4   Highest education level: Not on file  Occupational History    Comment: retired  Tobacco Use   Smoking status: Never   Smokeless tobacco: Never  Vaping  Use   Vaping Use: Never used  Substance and Sexual Activity   Alcohol use: No   Drug use: No   Sexual activity: Never  Other Topics Concern   Not on file  Social History Narrative   Separated. 4 children from first marriage. 16 grandkids.       Retired from [employer] for 25 years, went to [employer] for 5 years. Retired 2003 after MI      Hobbies: babysit/active with children      Right-handed      Caffeine: 24 oz soda per day      Social Determinants of Health   Financial Resource Strain: Low Risk  (04/13/2023)   Overall Financial Resource Strain (CARDIA)    Difficulty of Paying Living Expenses: Not hard at all  Food Insecurity: No Food Insecurity (04/13/2023)   Hunger Vital Sign    Worried About Running Out of Food in the Last Year: Never true    Ran Out of Food in the Last Year: Never true  Transportation Needs: No Transportation Needs (04/13/2023)   PRAPARE - Administrator, Civil Service (Medical): No    Lack of Transportation (Non-Medical): No  Physical Activity: Insufficiently Active (04/13/2023)   Exercise Vital Sign    Days of Exercise per Week: 3 days    Minutes of Exercise per Session: 30 min  Stress: No Stress Concern Present (04/13/2023)   [employer] of Occupational Health - Occupational Stress Questionnaire    Feeling of Stress : Not at all  Social Connections: Moderately Isolated (04/13/2023)   Social  Connection and Isolation Panel [NHANES]    Frequency of Communication with Friends and Family: More than three times a week    Frequency of Social Gatherings with Friends and Family: Three times a week    Attends Religious Services: More than 4 times per year    Active Member of Clubs or Organizations: No    Attends Banker Meetings: Never    Marital Status: Widowed    Tobacco Counseling Counseling given: Not Answered   Clinical Intake:  Pre-visit preparation completed: Yes  Pain : No/denies pain     BMI - recorded: 22.92 Nutritional Status: BMI of 19-24  Normal Nutritional Risks: None Diabetes: Yes CBG done?: No Did pt. bring in CBG monitor from home?: No  How often do you need to have someone help you when you read instructions, pamphlets, or other written materials from your doctor or pharmacy?: 1 - Never  Interpreter Needed?: No  Information entered by :: Lanier Ensign, LPN   Activities of Daily Living    04/13/2023    9:50 AM 05/12/2022   10:08 AM  In your present state of health, do you have any difficulty performing the following activities:  Hearing? 1 0  Comment HOH   Vision? 0 0  Difficulty concentrating or making decisions? 0 0  Walking or climbing stairs? 0 0  Dressing or bathing? 0 0  Doing errands, shopping? 0 0  Preparing Food and eating ? N N  Using the Toilet? N N  In the past six months, have you accidently leaked urine? N Y  Comment  wears a pad and brief sometime  Do you have problems with loss of bowel control? N N  Managing your Medications? N N  Managing your Finances? N N  Housekeeping or managing your Housekeeping? N N    Patient Care Team: Shelva Majestic, MD as PCP - General (Family Medicine)  Lyn Records, MD (Inactive) as PCP - Cardiology (Cardiology) Erroll Luna, [facility] (Pharmacist)  Indicate any recent Medical Services you may have received from other than [facility] providers in the past year (date may be  approximate).     Assessment:   This is a routine wellness examination for [place].  Hearing/Vision screen Hearing Screening - Comments:: Pt stated HOH  Vision Screening - Comments:: Pt follows up with new provider soon   Dietary issues and exercise activities discussed:     Goals Addressed             This Visit's Progress    Patient Stated       Keep exercising        Depression Screen    04/13/2023    9:48 AM 05/12/2022   10:04 AM 02/06/2021    2:13 PM 11/29/2020    1:49 PM 02/14/2020   10:04 AM 01/09/2020    4:02 PM 01/09/2020    1:05 PM  PHQ 2/9 Scores  PHQ - 2 Score 0 1 0 0 0 2 2  PHQ- 9 Score     4 5 6     Fall Risk    04/13/2023    9:49 AM 09/16/2022   11:17 AM 05/12/2022   10:07 AM 02/06/2021    2:19 PM 11/29/2020    1:49 PM  Fall Risk   Falls in the past year? 1 1 0 0 0  Number falls in past yr: 1 1 0 0 0  Injury with Fall? 1 1 0 0 0  Comment right hand sprung      Risk for fall due to : History of fall(s);Impaired mobility;Impaired balance/gait;Impaired vision History of fall(s) Impaired vision;Impaired balance/gait Impaired vision   Follow up Falls prevention discussed Falls evaluation completed Falls prevention discussed Falls prevention discussed     MEDICARE RISK AT HOME:  Medicare Risk at Home - 04/13/23 0951     Any stairs in or around the home? No    If so, are there any without handrails? No    Home free of loose throw rugs in walkways, pet beds, electrical cords, etc? Yes    Adequate lighting in your home to reduce risk of falls? Yes    Life alert? No    Use of a cane, walker or w/c? No    Grab bars in the bathroom? No    Shower chair or bench in shower? Yes    Elevated toilet seat or a handicapped toilet? No             TIMED UP AND GO:  Was the test performed?  No    Cognitive Function:    11/02/2022    4:41 PM  MMSE - Mini Mental State Exam  Orientation to time 3  Orientation to Place 5  Registration 3  Attention/ Calculation  3  Recall 0  Language- name 2 objects 2  Language- repeat 1  Language- follow 3 step command 3  Language- read & follow direction 1  Write a sentence 1  Copy design 0  Total score 22        05/12/2022   10:09 AM 02/06/2021    2:21 PM 01/09/2020    4:02 PM  6CIT Screen  What Year? 0 points 0 points 0 points  What month? 0 points 0 points 0 points  What time? 0 points  0 points  Count back from 20 0 points 2 points 0 points  Months in  reverse 0 points 0 points 0 points  Repeat phrase 6 points 8 points 0 points  Total Score 6 points  0 points    Immunizations Immunization History  Administered Date(s) Administered   Influenza, High Dose Seasonal PF 08/17/2016, 08/06/2017, 07/09/2022   Influenza,inj,Quad PF,6+ Mos 10/11/2015   Janssen (J&J) SARS-COV-2 Vaccination 03/10/2020   Pneumococcal Conjugate-13 10/11/2015   Pneumococcal Polysaccharide-23 11/19/2016   Td 12/25/1995      Flu Vaccine status: Up to date  Pneumococcal vaccine status: Up to date  Covid-19 vaccine status: Completed vaccines  Qualifies for Shingles Vaccine? Yes   Zostavax completed No   Shingrix Completed?: No.    Education has been provided regarding the importance of this vaccine. Patient has been advised to call insurance company to determine out of pocket expense if they have not yet received this vaccine. Advised may also receive vaccine at local pharmacy or Health Dept. Verbalized acceptance and understanding.  Screening Tests Health Maintenance  Topic Date Due   OPHTHALMOLOGY EXAM  Never done   Zoster Vaccines- Shingrix (1 of 2) Never done   COVID-19 Vaccine (2 - Janssen risk series) 04/07/2020   DEXA SCAN  04/26/2023 (Originally 10/30/2011)   HEMOGLOBIN A1C  05/03/2023   INFLUENZA VACCINE  05/27/2023   Diabetic kidney evaluation - Urine ACR  09/17/2023   FOOT EXAM  09/17/2023   Diabetic kidney evaluation - eGFR measurement  04/05/2024   Medicare Annual Wellness (AWV)  04/12/2024   Pneumonia  Vaccine 64+ Years old  Completed   Hepatitis C Screening  Completed   HPV VACCINES  Aged Out   DTaP/Tdap/Td  Discontinued    Health Maintenance  Health Maintenance Due  Topic Date Due   OPHTHALMOLOGY EXAM  Never done   Zoster Vaccines- Shingrix (1 of 2) Never done   COVID-19 Vaccine (2 - Janssen risk series) 04/07/2020    Postponed colonoscopy   Mammogram status: Completed 12/01/22. Repeat every year     Additional Screening:  Hepatitis C Screening: Completed 04/06/16  Vision Screening: Recommended annual ophthalmology exams for early detection of glaucoma and other disorders of the eye. Is the patient up to date with their annual eye exam?  No  Who is the provider or what is the name of the office in which the patient attends annual eye exams? New provider soon  If pt is not established with a provider, would they like to be referred to a provider to establish care? No .   Dental Screening: Recommended annual dental exams for proper oral hygiene  Diabetic Foot Exam: Diabetic Foot Exam: Completed 09/16/22  Community Resource Referral / Chronic Care Management: CRR required this visit?  No   CCM required this visit?  No     Plan:     I have personally reviewed and noted the following in the patient's chart:   Medical and social history Use of alcohol, tobacco or illicit drugs  Current medications and supplements including opioid prescriptions. Patient is currently taking opioid prescriptions. Information provided to patient regarding non-opioid alternatives. Patient advised to discuss non-opioid treatment plan with their provider. Functional ability and status Nutritional status Physical activity Advanced directives List of other physicians Hospitalizations, surgeries, and ER visits in previous 12 months Vitals Screenings to include cognitive, depression, and falls Referrals and appointments  In addition, I have reviewed and discussed with patient certain  preventive protocols, quality metrics, and best practice recommendations. A written personalized care plan for preventive services as well as general preventive health  recommendations were provided to patient.     Marzella Schlein, LPN   [id number]   After Visit Summary: (MyChart) Due to this being a telephonic visit, the after visit summary with patients personalized plan was offered to patient via MyChart   Nurse Notes: none

## 2023-04-13 NOTE — Patient Instructions (Signed)
[name] , Thank you for taking time to come for your Medicare Wellness Visit. I appreciate your ongoing commitment to your health goals. Please review the following plan we discussed and let me know if I can assist you in the future.   These are the goals we discussed:  Goals      Patient Stated     Exercise      Patient Stated     None at  this time      Track and Manage My Blood Pressure-Hypertension     Timeframe:  Long-Range Goal Priority:  High Start Date:   07/29/21                          Expected End Date: 01/27/22                      Follow Up Date 10/29/21    - check blood pressure weekly - choose a place to take my blood pressure (home, clinic or office, retail store) - write blood pressure results in a log or diary    Why is this important?   You won't feel high blood pressure, but it can still hurt your blood vessels.  High blood pressure can cause heart or kidney problems. It can also cause a stroke.  Making lifestyle changes like losing a little weight or eating less salt will help.  Checking your blood pressure at home and at different times of the day can help to control blood pressure.  If the doctor prescribes medicine remember to take it the way the doctor ordered.  Call the office if you cannot afford the medicine or if there are questions about it.     Notes:         This is a list of the screening recommended for you and due dates:  Health Maintenance  Topic Date Due   Eye exam for diabetics  Never done   Zoster (Shingles) Vaccine (1 of 2) Never done   DEXA scan (bone density measurement)  Never done   COVID-19 Vaccine (2 - Janssen risk series) 04/07/2020   Hemoglobin A1C  05/03/2023   Flu Shot  05/27/2023   Yearly kidney health urinalysis for diabetes  09/17/2023   Complete foot exam   09/17/2023   Yearly kidney function blood test for diabetes  04/05/2024   Medicare Annual Wellness Visit  04/12/2024   Pneumonia Vaccine  Completed    Hepatitis C Screening  Completed   HPV Vaccine  Aged Out   DTaP/Tdap/Td vaccine  Discontinued    Advanced directives: Advance directive discussed with you today. Even though you declined this today please call our office should you change your mind and we can give you the proper paperwork for you to fill out.  Conditions/risks identified: keep exercising   Next appointment: Follow up in one year for your annual wellness visit    Preventive Care 65 Years and Older, Female Preventive care refers to lifestyle choices and visits with your health care provider that can promote health and wellness. What does preventive care include? A yearly physical exam. This is also called an annual well check. Dental exams once or twice a year. Routine eye exams. Ask your health care provider how often you should have your eyes checked. Personal lifestyle choices, including: Daily care of your teeth and gums. Regular physical activity. Eating a healthy diet. Avoiding tobacco and drug use. Limiting  alcohol use. Practicing safe sex. Taking low-dose aspirin every day. Taking vitamin and mineral supplements as recommended by your health care provider. What happens during an annual well check? The services and screenings done by your health care provider during your annual well check will depend on your age, overall health, lifestyle risk factors, and family history of disease. Counseling  Your health care provider may ask you questions about your: Alcohol use. Tobacco use. Drug use. Emotional well-being. Home and relationship well-being. Sexual activity. Eating habits. History of falls. Memory and ability to understand (cognition). Work and work Astronomer. Reproductive health. Screening  You may have the following tests or measurements: Height, weight, and BMI. Blood pressure. Lipid and cholesterol levels. These may be checked every 5 years, or more frequently if you are over 22 years  old. Skin check. Lung cancer screening. You may have this screening every year starting at age 30 if you have a 30-pack-year history of smoking and currently smoke or have quit within the past 15 years. Fecal occult blood test (FOBT) of the stool. You may have this test every year starting at age 92. Flexible sigmoidoscopy or colonoscopy. You may have a sigmoidoscopy every 5 years or a colonoscopy every 10 years starting at age 27. Hepatitis C blood test. Hepatitis B blood test. Sexually transmitted disease (STD) testing. Diabetes screening. This is done by checking your blood sugar (glucose) after you have not eaten for a while (fasting). You may have this done every 1-3 years. Bone density scan. This is done to screen for osteoporosis. You may have this done starting at age 76. Mammogram. This may be done every 1-2 years. Talk to your health care provider about how often you should have regular mammograms. Talk with your health care provider about your test results, treatment options, and if necessary, the need for more tests. Vaccines  Your health care provider may recommend certain vaccines, such as: Influenza vaccine. This is recommended every year. Tetanus, diphtheria, and acellular pertussis (Tdap, Td) vaccine. You may need a Td booster every 10 years. Zoster vaccine. You may need this after age 42. Pneumococcal 13-valent conjugate (PCV13) vaccine. One dose is recommended after age 84. Pneumococcal polysaccharide (PPSV23) vaccine. One dose is recommended after age 20. Talk to your health care provider about which screenings and vaccines you need and how often you need them. This information is not intended to replace advice given to you by your health care provider. Make sure you discuss any questions you have with your health care provider. Document Released: 11/08/2015 Document Revised: 07/01/2016 Document Reviewed: 08/13/2015 Elsevier Interactive Patient Education  2017 [employer].  Fall Prevention in the Home Falls can cause injuries. They can happen to people of all ages. There are many things you can do to make your home safe and to help prevent falls. What can I do on the outside of my home? Regularly fix the edges of walkways and driveways and fix any cracks. Remove anything that might make you trip as you walk through a door, such as a raised step or threshold. Trim any bushes or trees on the path to your home. Use bright outdoor lighting. Clear any walking paths of anything that might make someone trip, such as rocks or tools. Regularly check to see if handrails are loose or broken. Make sure that both sides of any steps have handrails. Any raised decks and porches should have guardrails on the edges. Have any leaves, snow, or ice cleared regularly. Use  sand or salt on walking paths during winter. Clean up any spills in your garage right away. This includes oil or grease spills. What can I do in the bathroom? Use night lights. Install grab bars by the toilet and in the tub and shower. Do not use towel bars as grab bars. Use non-skid mats or decals in the tub or shower. If you need to sit down in the shower, use a plastic, non-slip stool. Keep the floor dry. Clean up any water that spills on the floor as soon as it happens. Remove soap buildup in the tub or shower regularly. Attach bath mats securely with double-sided non-slip rug tape. Do not have throw rugs and other things on the floor that can make you trip. What can I do in the bedroom? Use night lights. Make sure that you have a light by your bed that is easy to reach. Do not use any sheets or blankets that are too big for your bed. They should not hang down onto the floor. Have a firm chair that has side arms. You can use this for support while you get dressed. Do not have throw rugs and other things on the floor that can make you trip. What can I do in the kitchen? Clean up any spills right  away. Avoid walking on wet floors. Keep items that you use a lot in easy-to-reach places. If you need to reach something above you, use a strong step stool that has a grab bar. Keep electrical cords out of the way. Do not use floor polish or wax that makes floors slippery. If you must use wax, use non-skid floor wax. Do not have throw rugs and other things on the floor that can make you trip. What can I do with my stairs? Do not leave any items on the stairs. Make sure that there are handrails on both sides of the stairs and use them. Fix handrails that are broken or loose. Make sure that handrails are as long as the stairways. Check any carpeting to make sure that it is firmly attached to the stairs. Fix any carpet that is loose or worn. Avoid having throw rugs at the top or bottom of the stairs. If you do have throw rugs, attach them to the floor with carpet tape. Make sure that you have a light switch at the top of the stairs and the bottom of the stairs. If you do not have them, ask someone to add them for you. What else can I do to help prevent falls? Wear shoes that: Do not have high heels. Have rubber bottoms. Are comfortable and fit you well. Are closed at the toe. Do not wear sandals. If you use a stepladder: Make sure that it is fully opened. Do not climb a closed stepladder. Make sure that both sides of the stepladder are locked into place. Ask someone to hold it for you, if possible. Clearly mark and make sure that you can see: Any grab bars or handrails. First and last steps. Where the edge of each step is. Use tools that help you move around (mobility aids) if they are needed. These include: Canes. Walkers. Scooters. Crutches. Turn on the lights when you go into a dark area. Replace any light bulbs as soon as they burn out. Set up your furniture so you have a clear path. Avoid moving your furniture around. If any of your floors are uneven, fix them. If there are any  pets around you, be  aware of where they are. Review your medicines with your doctor. Some medicines can make you feel dizzy. This can increase your chance of falling. Ask your doctor what other things that you can do to help prevent falls. This information is not intended to replace advice given to you by your health care provider. Make sure you discuss any questions you have with your health care provider. Document Released: 08/08/2009 Document Revised: 03/19/2016 Document Reviewed: 11/16/2014 Elsevier Interactive Patient Education  2017 [employer].

## 2023-04-15 NOTE — Progress Notes (Signed)
I connected with  [name] on 04/15/23 by a audio enabled telemedicine application and verified that I am speaking with the correct person using two identifiers.  Patient Location: Home  Provider Location: Office/Clinic  I discussed the limitations of evaluation and management by telemedicine. The patient expressed understanding and agreed to proceed.  Subjective:   [name] is a 77 y.o. female who presents for Medicare Annual (Subsequent) preventive examination.  In person visit  Patient Location: Other:  office  Provider Location: Office/Clinic  I discussed the limitations of evaluation and management by telemedicine. The patient expressed understanding and agreed to proceed.   Review of Systems     Cardiac Risk Factors include: advanced age (>27men, >83 women);hypertension;dyslipidemia     Objective:    Today's Vitals   04/13/23 0943  Weight: 142 lb (64.4 kg)   Body mass index is 22.92 kg/m.     04/13/2023    9:49 AM 11/18/2022    1:26 PM 06/07/2022   12:09 AM 05/12/2022   10:05 AM 02/06/2021    2:18 PM 02/03/2020    1:18 PM 02/03/2020   12:09 PM  Advanced Directives  Does Patient Have a Medical Advance Directive? No No No No No No No  Would patient like information on creating a medical advance directive? No - Patient declined No - Patient declined Yes (ED - Information included in AVS) No - Patient declined Yes (MAU/Ambulatory/Procedural Areas - Information given)  No - Patient declined;Yes (ED - Information included in AVS)    Current Medications (verified) Outpatient Encounter Medications as of 04/13/2023  Medication Sig   acetaminophen (TYLENOL) 325 MG tablet Take 2 tablets (650 mg total) by mouth 4 (four) times daily.   amLODipine (NORVASC) 10 MG tablet TAKE 1 TABLET(10 MG) BY MOUTH DAILY.   aspirin EC 325 MG tablet Take 325 mg by mouth every other day.   cephALEXin (KEFLEX) 500 MG capsule Take 1 capsule (500 mg total) by mouth 2 (two) times daily for 7  days.   Cholecalciferol (VITAMIN D3) 25 MCG (1000 UT) CAPS Take by mouth.   Cyanocobalamin (VITAMIN B-12) 3000 MCG SUBL Place 3,000 mg under the tongue daily.   dicyclomine (BENTYL) 20 MG tablet Take 1 tablet (20 mg total) by mouth daily as needed for spasms.   esomeprazole (NEXIUM) 20 MG capsule TAKE 1 CAPSULE(20 MG) BY MOUTH DAILY   ezetimibe (ZETIA) 10 MG tablet TAKE ONE TABLET (10MG  TOTAL) BY MOUTH DAILY   furosemide (LASIX) 20 MG tablet TAKE ONE TABLET (20MG  TOTAL) BY MOUTH DAILY AT 9 AM   isosorbide mononitrate (IMDUR) 120 MG 24 hr tablet TAKE ONE TABLET (120 MG TOTAL) BY MOUTH DAILY AT 9 AM (NEED APPOINTMENT FOR FURTHER REFILLS)   meclizine (ANTIVERT) 12.5 MG tablet Take 1 tablet (12.5 mg total) by mouth 3 (three) times daily as needed for dizziness.   methimazole (TAPAZOLE) 10 MG tablet TAKE 1 TABLET(10 MG) BY MOUTH TWICE DAILY   metoprolol succinate (TOPROL-XL) 25 MG 24 hr tablet Take 1 tablet (25 mg total) by mouth daily.   nitroGLYCERIN (NITROSTAT) 0.4 MG SL tablet PLACE 1 TABLET UNDER THE TONGUE EVERY 5 MINUTES FOR CHEST PAIN FOR UPTO 3 DOSES (Patient taking differently: PLACE 1 TABLET UNDER THE TONGUE EVERY 5 MINUTES FOR CHEST PAIN FOR UPTO 3 DOSES, keep on hand)   oxybutynin (DITROPAN-XL) 10 MG 24 hr tablet Take 1 tablet (10 mg total) by mouth daily.   oxyCODONE-acetaminophen (PERCOCET) 10-325 MG tablet as needed. Taking as  needed   potassium chloride (KLOR-CON) 10 MEQ tablet Take 1 tablet (10 mEq total) by mouth 2 (two) times daily for 5 days.   venlafaxine XR (EFFEXOR-XR) 150 MG 24 hr capsule TAKE 1 CAPSULE BY MOUTH AT BEDTIME   vitamin C (VITAMIN C) 500 MG tablet Take 1 tablet (500 mg total) by mouth 2 (two) times daily.   Vitamin E 180 MG CAPS Take 2 capsules by mouth daily.   zinc sulfate 220 (50 Zn) MG capsule Take 1 capsule (220 mg total) by mouth daily.   atorvastatin (LIPITOR) 40 MG tablet TAKE ONE TABLET BY MOUTH DAILY AT 5 PM. PATIENT NEEDS APPOINTMENT BEFORE NEXT REFILL  (Patient not taking: Reported on 04/13/2023)   [DISCONTINUED] ASPIRIN LOW DOSE PO Take 325 mg by mouth daily.   [DISCONTINUED] ondansetron (ZOFRAN) 4 MG tablet Take 1 tablet (4 mg total) by mouth every 6 (six) hours.   No facility-administered encounter medications on file as of 04/13/2023.    Allergies (verified) Crestor [rosuvastatin] and Sulfa drugs cross reactors   History: Past Medical History:  Diagnosis Date   Anemia 12/22/2017   Arthritis    Chronic lower back pain    Cluster headache    Coronary artery disease    a. 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 non obs dz LHC 2007. c. Non obs dz (mild-mod) by [facility] 05/17/14 d. cath 05/26/17 mild non obstructive CAD   COVID-19 08/07/2019   Diverticulitis    a. Hx microperf 2012 - hospitalizated.   GERD (gastroesophageal reflux disease)    Hypercholesteremia    Hypertension    Overactive bladder 09/19/2014   Oxybutynin 5mg  XL--> 10mg    PVC's (premature ventricular contractions)    a. Hx of trigeminy/bigeminy.   Seizures (HCC)    Thyroid disease    hyperthyroid   UTI (lower urinary tract infection)    Past Surgical History:  Procedure Laterality Date   ABDOMINAL HYSTERECTOMY  1987   "partial"-still has ovaries   CARDIAC CATHETERIZATION  05/17/2014   CORONARY ANGIOPLASTY WITH STENT PLACEMENT  1995   "1"   JOINT REPLACEMENT     right knee   LEFT HEART CATH AND CORONARY ANGIOGRAPHY N/A 05/26/2017   Procedure: Left Heart Cath and Coronary Angiography;  Surgeon: Corky Crafts, MD;  Location: [facility] INVASIVE CV LAB;  Service: Cardiovascular;  Laterality: N/A;   LEFT HEART CATHETERIZATION WITH CORONARY ANGIOGRAM N/A 05/17/2014   Procedure: LEFT HEART CATHETERIZATION WITH CORONARY ANGIOGRAM;  Surgeon: Marykay Lex, MD;  Location: [facility] CATH LAB;  Service: Cardiovascular;  Laterality: N/A;   TOTAL KNEE ARTHROPLASTY Right 2005   TUBAL LIGATION  1970   VASCULAR SURGERY     Family History  Problem Relation Age of Onset   CAD Brother    Diabetes  Brother    CAD Father    Diabetes Father    Diabetes Mother        father, sister, brothers   Diabetes Sister    Thyroid disease Sister    Heart attack Brother    Prostate cancer Brother    Thyroid disease Sister    Diabetes Sister    Social History   Socioeconomic History   Marital status: Married    Spouse name: Not on file   Number of children: 4   Years of education: 4   Highest education level: Not on file  Occupational History    Comment: retired  Tobacco Use   Smoking status: Never   Smokeless tobacco: Never  Vaping  Use   Vaping Use: Never used  Substance and Sexual Activity   Alcohol use: No   Drug use: No   Sexual activity: Never  Other Topics Concern   Not on file  Social History Narrative   Separated. 4 children from first marriage. 16 grandkids.       Retired from [employer] for 25 years, went to [employer] for 5 years. Retired 2003 after MI      Hobbies: babysit/active with children      Right-handed      Caffeine: 24 oz soda per day      Social Determinants of Health   Financial Resource Strain: Low Risk  (04/13/2023)   Overall Financial Resource Strain (CARDIA)    Difficulty of Paying Living Expenses: Not hard at all  Food Insecurity: No Food Insecurity (04/13/2023)   Hunger Vital Sign    Worried About Running Out of Food in the Last Year: Never true    Ran Out of Food in the Last Year: Never true  Transportation Needs: No Transportation Needs (04/13/2023)   PRAPARE - Administrator, Civil Service (Medical): No    Lack of Transportation (Non-Medical): No  Physical Activity: Insufficiently Active (04/13/2023)   Exercise Vital Sign    Days of Exercise per Week: 3 days    Minutes of Exercise per Session: 30 min  Stress: No Stress Concern Present (04/13/2023)   [employer] of Occupational Health - Occupational Stress Questionnaire    Feeling of Stress : Not at all  Social Connections: Moderately Isolated (04/13/2023)   Social  Connection and Isolation Panel [NHANES]    Frequency of Communication with Friends and Family: More than three times a week    Frequency of Social Gatherings with Friends and Family: Three times a week    Attends Religious Services: More than 4 times per year    Active Member of Clubs or Organizations: No    Attends Banker Meetings: Never    Marital Status: Widowed    Tobacco Counseling Counseling given: Not Answered   Clinical Intake:  Pre-visit preparation completed: Yes  Pain : No/denies pain     BMI - recorded: 22.92 Nutritional Status: BMI of 19-24  Normal Nutritional Risks: None Diabetes: Yes CBG done?: No Did pt. bring in CBG monitor from home?: No  How often do you need to have someone help you when you read instructions, pamphlets, or other written materials from your doctor or pharmacy?: 1 - Never  Interpreter Needed?: No  Information entered by :: Lanier Ensign, LPN   Activities of Daily Living    04/13/2023    9:50 AM 05/12/2022   10:08 AM  In your present state of health, do you have any difficulty performing the following activities:  Hearing? 1 0  Comment HOH   Vision? 0 0  Difficulty concentrating or making decisions? 0 0  Walking or climbing stairs? 0 0  Dressing or bathing? 0 0  Doing errands, shopping? 0 0  Preparing Food and eating ? N N  Using the Toilet? N N  In the past six months, have you accidently leaked urine? N Y  Comment  wears a pad and brief sometime  Do you have problems with loss of bowel control? N N  Managing your Medications? N N  Managing your Finances? N N  Housekeeping or managing your Housekeeping? N N    Patient Care Team: Shelva Majestic, MD as PCP - General (Family Medicine)  Lyn Records, MD (Inactive) as PCP - Cardiology (Cardiology) Erroll Luna, [facility] (Inactive) (Pharmacist)  Indicate any recent Medical Services you may have received from other than [facility] providers in the past year (date  may be approximate).     Assessment:   This is a routine wellness examination for [place].  Hearing/Vision screen Hearing Screening - Comments:: Pt stated HOH  Vision Screening - Comments:: Pt follows up with new provider soon   Dietary issues and exercise activities discussed:     Goals Addressed             This Visit's Progress    Patient Stated       Keep exercising       Depression Screen    04/13/2023    9:48 AM 05/12/2022   10:04 AM 02/06/2021    2:13 PM 11/29/2020    1:49 PM 02/14/2020   10:04 AM 01/09/2020    4:02 PM 01/09/2020    1:05 PM  PHQ 2/9 Scores  PHQ - 2 Score 0 1 0 0 0 2 2  PHQ- 9 Score     4 5 6     Fall Risk    04/13/2023    9:49 AM 09/16/2022   11:17 AM 05/12/2022   10:07 AM 02/06/2021    2:19 PM 11/29/2020    1:49 PM  Fall Risk   Falls in the past year? 1 1 0 0 0  Number falls in past yr: 1 1 0 0 0  Injury with Fall? 1 1 0 0 0  Comment right hand sprung      Risk for fall due to : History of fall(s);Impaired mobility;Impaired balance/gait;Impaired vision History of fall(s) Impaired vision;Impaired balance/gait Impaired vision   Follow up Falls prevention discussed Falls evaluation completed Falls prevention discussed Falls prevention discussed     MEDICARE RISK AT HOME:    TIMED UP AND GO:  Was the test performed?  No    Cognitive Function: completed at 04/13/23 visit     11/02/2022    4:41 PM  MMSE - Mini Mental State Exam  Orientation to time 3  Orientation to Place 5  Registration 3  Attention/ Calculation 3  Recall 0  Language- name 2 objects 2  Language- repeat 1  Language- follow 3 step command 3  Language- read & follow direction 1  Write a sentence 1  Copy design 0  Total score 22        04/15/2023    2:56 PM 05/12/2022   10:09 AM 02/06/2021    2:21 PM 01/09/2020    4:02 PM  6CIT Screen  What Year? 0 points 0 points 0 points 0 points  What month? 0 points 0 points 0 points 0 points  What time? 0 points 0 points  0  points  Count back from 20 0 points 0 points 2 points 0 points  Months in reverse 0 points 0 points 0 points 0 points  Repeat phrase 0 points 6 points 8 points 0 points  Total Score 0 points 6 points  0 points    Immunizations Immunization History  Administered Date(s) Administered   Influenza, High Dose Seasonal PF 08/17/2016, 08/06/2017, 07/09/2022   Influenza,inj,Quad PF,6+ Mos 10/11/2015   Janssen (J&J) SARS-COV-2 Vaccination 03/10/2020   Pneumococcal Conjugate-13 10/11/2015   Pneumococcal Polysaccharide-23 11/19/2016   Td 12/25/1995      Flu Vaccine status: Up to date  Pneumococcal vaccine status: Up to date  Covid-19 vaccine status:  Completed vaccines  Qualifies for Shingles Vaccine? Yes   Zostavax completed No   Shingrix Completed?: No.    Education has been provided regarding the importance of this vaccine. Patient has been advised to call insurance company to determine out of pocket expense if they have not yet received this vaccine. Advised may also receive vaccine at local pharmacy or Health Dept. Verbalized acceptance and understanding.  Screening Tests Health Maintenance  Topic Date Due   OPHTHALMOLOGY EXAM  Never done   Zoster Vaccines- Shingrix (1 of 2) Never done   COVID-19 Vaccine (2 - Janssen risk series) 04/07/2020   DEXA SCAN  04/26/2023 (Originally 10/30/2011)   HEMOGLOBIN A1C  05/03/2023   INFLUENZA VACCINE  05/27/2023   Diabetic kidney evaluation - Urine ACR  09/17/2023   FOOT EXAM  09/17/2023   Diabetic kidney evaluation - eGFR measurement  04/05/2024   Medicare Annual Wellness (AWV)  04/12/2024   Pneumonia Vaccine 29+ Years old  Completed   Hepatitis C Screening  Completed   HPV VACCINES  Aged Out   DTaP/Tdap/Td  Discontinued    Health Maintenance  Health Maintenance Due  Topic Date Due   OPHTHALMOLOGY EXAM  Never done   Zoster Vaccines- Shingrix (1 of 2) Never done   COVID-19 Vaccine (2 - Janssen risk series) 04/07/2020    Postponed  colonoscopy   Mammogram status: Completed 12/01/22. Repeat every year     Additional Screening:  Hepatitis C Screening: Completed 04/06/16  Vision Screening: Recommended annual ophthalmology exams for early detection of glaucoma and other disorders of the eye. Is the patient up to date with their annual eye exam?  No  Who is the provider or what is the name of the office in which the patient attends annual eye exams? New provider soon  If pt is not established with a provider, would they like to be referred to a provider to establish care? No .   Dental Screening: Recommended annual dental exams for proper oral hygiene  Diabetic Foot Exam: Diabetic Foot Exam: Completed 09/16/22  Community Resource Referral / Chronic Care Management: CRR required this visit?  No   CCM required this visit?  No     Plan:     I have personally reviewed and noted the following in the patient's chart:   Medical and social history Use of alcohol, tobacco or illicit drugs  Current medications and supplements including opioid prescriptions. Patient is currently taking opioid prescriptions. Information provided to patient regarding non-opioid alternatives. Patient advised to discuss non-opioid treatment plan with their provider. Functional ability and status Nutritional status Physical activity Advanced directives List of other physicians Hospitalizations, surgeries, and ER visits in previous 12 months Vitals Screenings to include cognitive, depression, and falls Referrals and appointments  In addition, I have reviewed and discussed with patient certain preventive protocols, quality metrics, and best practice recommendations. A written personalized care plan for preventive services as well as general preventive health recommendations were provided to patient.     Marzella Schlein, LPN   [id number]/1191   After Visit Summary: (MyChart) Due to this being a telephonic visit, the after visit summary with  patients personalized plan was offered to patient via MyChart   Nurse Notes: none

## 2023-04-17 ENCOUNTER — Other Ambulatory Visit: Payer: Self-pay | Admitting: Internal Medicine

## 2023-04-18 ENCOUNTER — Emergency Department (HOSPITAL_BASED_OUTPATIENT_CLINIC_OR_DEPARTMENT_OTHER)
Admission: EM | Admit: 2023-04-18 | Discharge: 2023-04-19 | Disposition: A | Discharge status: Home / Self Care | Payer: Medicare Other | Attending: Emergency Medicine | Admitting: Emergency Medicine

## 2023-04-18 DIAGNOSIS — R410 Disorientation, unspecified: Secondary | ICD-10-CM | POA: Insufficient documentation

## 2023-04-18 DIAGNOSIS — M25532 Pain in left wrist: Secondary | ICD-10-CM | POA: Diagnosis not present

## 2023-04-18 DIAGNOSIS — Z7982 Long term (current) use of aspirin: Secondary | ICD-10-CM | POA: Insufficient documentation

## 2023-04-18 DIAGNOSIS — Y92410 Unspecified street and highway as the place of occurrence of the external cause: Secondary | ICD-10-CM | POA: Diagnosis not present

## 2023-04-18 DIAGNOSIS — M25531 Pain in right wrist: Secondary | ICD-10-CM | POA: Insufficient documentation

## 2023-04-18 DIAGNOSIS — M25511 Pain in right shoulder: Secondary | ICD-10-CM | POA: Insufficient documentation

## 2023-04-18 DIAGNOSIS — M791 Myalgia, unspecified site: Secondary | ICD-10-CM | POA: Diagnosis not present

## 2023-04-18 DIAGNOSIS — M25512 Pain in left shoulder: Secondary | ICD-10-CM | POA: Insufficient documentation

## 2023-04-18 DIAGNOSIS — M545 Low back pain, unspecified: Secondary | ICD-10-CM | POA: Insufficient documentation
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
## 2023-04-19 ENCOUNTER — Emergency Department (HOSPITAL_BASED_OUTPATIENT_CLINIC_OR_DEPARTMENT_OTHER): Payer: Medicare Other

## 2023-04-19 ENCOUNTER — Emergency Department (HOSPITAL_BASED_OUTPATIENT_CLINIC_OR_DEPARTMENT_OTHER): Payer: Medicare Other | Admitting: Radiology

## 2023-04-19 ENCOUNTER — Other Ambulatory Visit: Payer: Self-pay

## 2023-04-19 ENCOUNTER — Encounter (HOSPITAL_BASED_OUTPATIENT_CLINIC_OR_DEPARTMENT_OTHER): Payer: Self-pay | Admitting: *Deleted

## 2023-04-19 DIAGNOSIS — Z041 Encounter for examination and observation following transport accident: Secondary | ICD-10-CM | POA: Diagnosis not present

## 2023-04-19 DIAGNOSIS — S3992XA Unspecified injury of lower back, initial encounter: Secondary | ICD-10-CM | POA: Diagnosis not present

## 2023-04-19 DIAGNOSIS — S0990XA Unspecified injury of head, initial encounter: Secondary | ICD-10-CM | POA: Diagnosis not present

## 2023-04-19 DIAGNOSIS — S6992XA Unspecified injury of left wrist, hand and finger(s), initial encounter: Secondary | ICD-10-CM | POA: Diagnosis not present

## 2023-04-19 DIAGNOSIS — S199XXA Unspecified injury of neck, initial encounter: Secondary | ICD-10-CM | POA: Diagnosis not present

## 2023-04-19 NOTE — ED Notes (Signed)
All appropriate discharge materials reviewed at length with patient. Time for questions provided. Pt has no other questions at this time and verbalizes understanding of all provided materials.  
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
## 2023-04-21 ENCOUNTER — Other Ambulatory Visit: Payer: Self-pay | Admitting: Family Medicine

## 2023-04-23 ENCOUNTER — Telehealth: Payer: Self-pay | Admitting: Family Medicine

## 2023-04-23 ENCOUNTER — Other Ambulatory Visit: Payer: Self-pay | Admitting: Physician Assistant

## 2023-04-23 NOTE — Telephone Encounter (Signed)
Prescription Request  04/23/2023  LOV: 01/07/2023  What is the name of the medication or equipment? potassium chloride (KLOR-CON) 10 MEQ tablet   Pt stated pharmacy told her OV was needed. I confirmed this with pt since PCP is not the one managing this medication and she hasn't been seen since March 2024. Pt already has OV on 7/3 and I suggested having med refilled then but she refused. Pt is wanting med called in today.  Have you contacted your pharmacy to request a refill? Yes   Which pharmacy would you like this sent to?  [facility] [phone] - [place], [place] - [address] AT [facility] [address] [place] [address] Phone: [phone] Fax: [phone]    Patient notified that their request is being sent to the clinical staff for review and that they should receive a response within 2 business days.   Please advise at Mobile [phone] (mobile)

## 2023-04-23 NOTE — Telephone Encounter (Signed)
Ok to send under your name?

## 2023-04-23 NOTE — Telephone Encounter (Signed)
Former pt of Dr. Katrinka Blazing; last ov with Julian Hy, [place] in 12/2021. Please review for refill. Thank you!

## 2023-04-23 NOTE — Telephone Encounter (Signed)
Looks like only given for 10 days- we will need to discuss in person and update labs to see if she needs this

## 2023-04-26 NOTE — Telephone Encounter (Signed)
Please schedule ov to discuss/labs.

## 2023-04-26 NOTE — Telephone Encounter (Signed)
LVM informing pt that OV is needed. Advised to keep OV for 7/3.

## 2023-04-28 ENCOUNTER — Encounter: Payer: Self-pay | Admitting: Family Medicine

## 2023-04-28 ENCOUNTER — Ambulatory Visit (INDEPENDENT_AMBULATORY_CARE_PROVIDER_SITE_OTHER): Payer: Medicare Other | Admitting: Family Medicine

## 2023-04-28 VITALS — BP 128/76 | HR 62 | Temp 97.4°F | Ht 66.0 in | Wt 139.2 lb

## 2023-04-28 DIAGNOSIS — I5020 Unspecified systolic (congestive) heart failure: Secondary | ICD-10-CM | POA: Diagnosis not present

## 2023-04-28 DIAGNOSIS — E7800 Pure hypercholesterolemia, unspecified: Secondary | ICD-10-CM | POA: Diagnosis not present

## 2023-04-28 DIAGNOSIS — E0590 Thyrotoxicosis, unspecified without thyrotoxic crisis or storm: Secondary | ICD-10-CM

## 2023-04-28 DIAGNOSIS — E119 Type 2 diabetes mellitus without complications: Secondary | ICD-10-CM | POA: Diagnosis not present

## 2023-04-28 DIAGNOSIS — I10 Essential (primary) hypertension: Secondary | ICD-10-CM

## 2023-04-28 LAB — COMPREHENSIVE METABOLIC PANEL
ALT: 14 U/L (ref 0–35)
AST: 16 U/L (ref 0–37)
Albumin: 3.7 g/dL (ref 3.5–5.2)
Alkaline Phosphatase: 61 U/L (ref 39–117)
BUN: 16 mg/dL (ref 6–23)
CO2: 34 mEq/L — ABNORMAL HIGH (ref 19–32)
Calcium: 9.6 mg/dL (ref 8.4–10.5)
Chloride: 103 mEq/L (ref 96–112)
Creatinine, Ser: 0.95 mg/dL (ref 0.40–1.20)
GFR: 58.2 mL/min — ABNORMAL LOW (ref >60.00)
Glucose, Bld: 87 mg/dL (ref 70–99)
Potassium: 3.4 mEq/L — ABNORMAL LOW (ref 3.5–5.1)
Sodium: 142 mEq/L (ref 135–145)
Total Bilirubin: 0.6 mg/dL (ref 0.2–1.2)
Total Protein: 7 g/dL (ref 6.0–8.3)

## 2023-04-28 LAB — LDL CHOLESTEROL, DIRECT: Direct LDL: 66 mg/dL

## 2023-04-28 LAB — T3, FREE: T3, Free: 2.7 pg/mL (ref 2.3–4.2)

## 2023-04-28 LAB — T4, FREE: Free T4: 0.74 ng/dL (ref 0.60–1.60)

## 2023-04-28 LAB — HEMOGLOBIN A1C: Hgb A1c MFr Bld: 6.6 % — ABNORMAL HIGH (ref 4.6–6.5)

## 2023-04-28 LAB — TSH: TSH: 2.19 u[IU]/mL (ref 0.35–5.50)

## 2023-04-28 MED ORDER — POTASSIUM CHLORIDE ER 10 MEQ PO CPCR: ORAL_CAPSULE | ORAL | 3 refills | Status: DC

## 2023-04-28 NOTE — Patient Instructions (Addendum)
Get diabetic eye exam scheduled.  Please stop by lab before you go If you have mychart- we will send your results within 3 business days of [place] receiving them.  If you do not have mychart- we will call you about results within 5 business days of [place] receiving them.  *please also note that you will see labs on mychart as soon as they post. I will later go in and write notes on them- will say "notes from Dr. Durene Cal"   Recommended follow up: Return for next already scheduled visit or sooner if needed.
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
## 2023-05-02 ENCOUNTER — Other Ambulatory Visit: Payer: Self-pay | Admitting: Internal Medicine

## 2023-05-03 ENCOUNTER — Encounter: Payer: Self-pay | Admitting: Internal Medicine

## 2023-05-03 ENCOUNTER — Telehealth: Payer: Self-pay

## 2023-05-03 ENCOUNTER — Ambulatory Visit (INDEPENDENT_AMBULATORY_CARE_PROVIDER_SITE_OTHER): Payer: Medicare Other | Admitting: Internal Medicine

## 2023-05-03 VITALS — BP 132/70 | HR 74 | Ht 66.0 in | Wt 135.8 lb

## 2023-05-03 DIAGNOSIS — E0590 Thyrotoxicosis, unspecified without thyrotoxic crisis or storm: Secondary | ICD-10-CM

## 2023-05-03 DIAGNOSIS — E0500 Thyrotoxicosis with diffuse goiter without thyrotoxic crisis or storm: Secondary | ICD-10-CM

## 2023-05-03 MED ORDER — METHIMAZOLE 10 MG PO TABS: 20.0000 mg | ORAL_TABLET | Freq: Every day | ORAL | 2 refills | Status: DC

## 2023-05-03 NOTE — Progress Notes (Signed)
Name: [name]  MRN/ DOB: [id number], 1946/02/19    Age/ Sex: 77 y.o., female     PCP: Shelva Majestic, MD   Reason for Endocrinology Evaluation: Hyperthyroidism     Initial Endocrinology Clinic Visit: 06/10/2017    PATIENT IDENTIFIER: [name] is a 77 y.o., female with a past medical history of HTN, hyperthyroid, PVCs, CAD and dyslipidemia. She has followed with [facility] since 06/10/2017 for consultative assistance with management of her Hyperthyroidism.   HISTORICAL SUMMARY: The patient was first diagnosed with Hyperthyroidism in 2012.  This has been attributed to Graves' disease she has been on Methimazole since her dx.    Thyroid ultrasound in 2018 was unrevealing TSI elevated >700   Sister with thyroid disease   SUBJECTIVE:     Today (05/03/2023):  [name] is here for a follow up on hyperthyroid management.  She has NOT been to our clinic in a year.  Weight has been decreasing  Denies local neck symptoms Denies palpitations  Has chronic constipation but no diarrhea Has very tremors Denies eye symptoms, past due for an eye exam, patient encouraged to see an ophthalmologist once a year   Methimazole 10 mg, 2 tabs daily  HISTORY:  Past Medical History:  Past Medical History:  Diagnosis Date   Anemia 12/22/2017   Arthritis    Chronic lower back pain    Cluster headache    Coronary artery disease    a. 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 non obs dz LHC 2007. c. Non obs dz (mild-mod) by [facility] 05/17/14 d. cath 05/26/17 mild non obstructive CAD   COVID-19 08/07/2019   Diverticulitis    a. Hx microperf 2012 - hospitalizated.   GERD (gastroesophageal reflux disease)    Hypercholesteremia    Hypertension    Overactive bladder 09/19/2014   Oxybutynin 5mg  XL--> 10mg    PVC's (premature ventricular contractions)    a. Hx of trigeminy/bigeminy.   Seizures (HCC)    Thyroid disease    hyperthyroid   UTI (lower urinary tract infection)    Past  Surgical History:  Past Surgical History:  Procedure Laterality Date   ABDOMINAL HYSTERECTOMY  1987   "partial"-still has ovaries   CARDIAC CATHETERIZATION  05/17/2014   CORONARY ANGIOPLASTY WITH STENT PLACEMENT  1995   "1"   JOINT REPLACEMENT     right knee   LEFT HEART CATH AND CORONARY ANGIOGRAPHY N/A 05/26/2017   Procedure: Left Heart Cath and Coronary Angiography;  Surgeon: Corky Crafts, MD;  [facility] CV LAB;  Service: Cardiovascular;  Laterality: N/A;   LEFT HEART CATHETERIZATION WITH CORONARY ANGIOGRAM N/A 05/17/2014   Procedure: LEFT HEART CATHETERIZATION WITH CORONARY ANGIOGRAM;  Surgeon: Marykay Lex, MD;  Location: [facility] CATH LAB;  Service: Cardiovascular;  Laterality: N/A;   TOTAL KNEE ARTHROPLASTY Right 2005   TUBAL LIGATION  1970   VASCULAR SURGERY     Social History:  reports that she has never smoked. She has never used smokeless tobacco. She reports that she does not drink alcohol and does not use drugs. Family History:  Family History  Problem Relation Age of Onset   CAD Brother    Diabetes Brother    CAD Father    Diabetes Father    Diabetes Mother        father, sister, brothers   Diabetes Sister    Thyroid disease Sister    Heart attack Brother    Prostate cancer Brother    Thyroid  disease Sister    Diabetes Sister      HOME MEDICATIONS: Allergies as of 05/03/2023       Reactions   Crestor [rosuvastatin] Other (See Comments)   Hot flashes and severe cramps   Sulfa Drugs Cross Reactors Shortness Of Breath, Palpitations        Medication List        Accurate as of May 03, 2023 10:44 AM. If you have any questions, ask your nurse or doctor.          acetaminophen 325 MG tablet Commonly known as: TYLENOL Take 2 tablets (650 mg total) by mouth 4 (four) times daily.   amLODipine 10 MG tablet Commonly known as: NORVASC TAKE 1 TABLET(10 MG) BY MOUTH DAILY.   ascorbic acid 500 MG tablet Commonly known as: VITAMIN C Take 1  tablet (500 mg total) by mouth 2 (two) times daily.   aspirin EC 325 MG tablet Take 325 mg by mouth every other day.   atorvastatin 40 MG tablet Commonly known as: LIPITOR TAKE ONE TABLET BY MOUTH DAILY AT 5 PM. PATIENT NEEDS APPOINTMENT BEFORE NEXT REFILL   dicyclomine 20 MG tablet Commonly known as: BENTYL Take 1 tablet (20 mg total) by mouth daily as needed for spasms.   esomeprazole 20 MG capsule Commonly known as: NEXIUM TAKE 1 CAPSULE(20 MG) BY MOUTH DAILY   ezetimibe 10 MG tablet Commonly known as: ZETIA TAKE ONE TABLET (10MG  TOTAL) BY MOUTH DAILY   furosemide 20 MG tablet Commonly known as: LASIX Take 1 tablet (20 mg total) by mouth daily. Take 1 tablet (20mg  total) by mouth daily at 9am.   isosorbide mononitrate 120 MG 24 hr tablet Commonly known as: IMDUR TAKE ONE TABLET (120 MG TOTAL) BY MOUTH DAILY AT 9 AM (NEED APPOINTMENT FOR FURTHER REFILLS)   meclizine 12.5 MG tablet Commonly known as: ANTIVERT Take 1 tablet (12.5 mg total) by mouth 3 (three) times daily as needed for dizziness.   methimazole 10 MG tablet Commonly known as: TAPAZOLE TAKE 1 TABLET(10 MG) BY MOUTH TWICE DAILY   metoprolol succinate 25 MG 24 hr tablet Commonly known as: TOPROL-XL Take 1 tablet (25 mg total) by mouth daily.   nitroGLYCERIN 0.4 MG SL tablet Commonly known as: NITROSTAT PLACE 1 TABLET UNDER THE TONGUE EVERY 5 MINUTES FOR CHEST PAIN FOR UPTO 3 DOSES What changed: additional instructions   oxybutynin 10 MG 24 hr tablet Commonly known as: DITROPAN-XL Take 1 tablet (10 mg total) by mouth daily.   oxyCODONE-acetaminophen 10-325 MG tablet Commonly known as: PERCOCET as needed. Taking as needed   potassium chloride 10 MEQ CR capsule Commonly known as: MICRO-K TAKE 1 CAPSULE(10 MEQ) BY MOUTH TWICE DAILY   venlafaxine XR 150 MG 24 hr capsule Commonly known as: EFFEXOR-XR TAKE ONE CAPSULE BY MOUTH DAILY AT 9 PM AT BEDTIME   Vitamin B-12 3000 MCG Subl Place 3,000 mg under  the tongue daily.   Vitamin D3 25 MCG (1000 UT) Caps Take by mouth.   Vitamin E 180 MG Caps Take 2 capsules by mouth daily.   zinc sulfate 220 (50 Zn) MG capsule Take 1 capsule (220 mg total) by mouth daily.          OBJECTIVE:   PHYSICAL EXAM: VS: BP 132/70 (BP Location: Left Arm, Patient Position: Sitting, Cuff Size: Small)   Pulse 74   Ht 5\' 6"  (1.676 m)   Wt 135 lb 12.8 oz (61.6 kg)   SpO2 96%   BMI  21.92 kg/m    EXAM: General: Pt appears well and is in NAD  Neck: General: Supple without adenopathy. Thyroid: Thyroid size normal.  No goiter or nodules appreciated. No thyroid bruit.  Lungs: Clear with good BS bilat   Heart: Auscultation: RRR.  Extremities:  BL LE: No pretibial edema  Mental Status: Judgment, insight: Intact Orientation: Oriented to time, place, and person Mood and affect: No depression, anxiety, or agitation     DATA REVIEWED:  Latest Reference Range & Units 04/28/23 09:14  TSH 0.35 - 5.50 uIU/mL 2.19  Triiodothyronine,Free,Serum 2.3 - 4.2 pg/mL 2.7  T4,Free(Direct) 0.60 - 1.60 ng/dL 8.29      TSI <562 % baseline >700 High      Thyroid ultrasound 05/23/2017  FINDINGS: Parenchymal Echotexture: Mildly heterogenous, hyperemic   Isthmus: 0.2 cm thickness   Right lobe: 4.6 x 2.3 x 1.8 cm   Left lobe: 4.4 x 2 x 1.4 cm   _________________________________________________________   Estimated total number of nodules >/= 1 cm: 0   Number of spongiform nodules >/=  2 cm not described below (TR1): 0   Number of mixed cystic and solid nodules >/= 1.5 cm not described below (TR2): 0   _________________________________________________________   No discrete nodules are seen within the thyroid gland.   IMPRESSION: 1. Heterogeneous hyperemic thyroid, normal in size without nodule.   ASSESSMENT / PLAN / RECOMMENDATIONS:   Hyperthyroidism:   -The patient is clinically euthyroid - No local neck symptoms  -Recent TFTs at PCPs office  are normal, no changes at this time -Discussed the importance of keeping up with her appointment and the need for serial TFTs, as the patient has not been to our clinic in a year  Medications  Methimazole 10 mg , 2 tabs daily     2.  Graves' disease:  - No extra thyroidal manifestations of Graves' disease    F/U in 6 months       Signed electronically by: Lyndle Herrlich, MD  [facility]  [facility] [address] [place], [place] [address] Phone: [phone] FAX: [phone]      CC: Shelva Majestic, MD [address] [place] [address] Phone: [phone]  Fax: [phone]   Return to Endocrinology clinic as below: Future Appointments  Date Time Provider Department Center  05/03/2023 10:50 AM Starlette Thurow, Konrad Dolores, MD [facility] None  07/01/2023  2:20 PM Shellia Cleverly, DO [facility]  07/13/2023  3:00 PM Shelva Majestic, MD [facility]  04/18/2024  9:15 AM [facility] ANNUAL WELLNESS VISIT 1 [facility]

## 2023-05-03 NOTE — Telephone Encounter (Signed)
Transition Care Management Follow-up Telephone Call Date of discharge and from where: [facility] 6/24 How have you been since you were released from the hospital? Doing fine Any questions or concerns? No  Items Reviewed: Did the pt receive and understand the discharge instructions provided? Yes  Medications obtained and verified? Yes  Other? No  Any new allergies since your discharge? No  Dietary orders reviewed? No Do you have support at home? Yes     Follow up appointments reviewed:  PCP Hospital f/u appt confirmed? Yes  Scheduled to see  on  @ . Specialist Hospital f/u appt confirmed? No  Scheduled to see  on  @ . Are transportation arrangements needed? No  If their condition worsens, is the pt aware to call PCP or go to the Emergency Dept.?  Was the patient provided with contact information for the PCP's office or ED? Yes Was to pt encouraged to call back with questions or concerns? Yes

## 2023-05-04 ENCOUNTER — Other Ambulatory Visit: Payer: Self-pay | Admitting: *Deleted

## 2023-05-04 MED ORDER — METOPROLOL SUCCINATE ER 25 MG PO TB24: 25.0000 mg | ORAL_TABLET | Freq: Every day | ORAL | 0 refills | Status: DC

## 2023-05-14 ENCOUNTER — Encounter: Payer: Self-pay | Admitting: Pharmacist

## 2023-05-14 NOTE — Progress Notes (Signed)
Patient previously followed by [facility] pharmacist. Per clinical review, no pharmacist appointment needed at this time.

## 2023-05-17 ENCOUNTER — Other Ambulatory Visit: Payer: Self-pay | Admitting: Physician Assistant

## 2023-05-20 ENCOUNTER — Telehealth: Payer: Self-pay | Admitting: Physician Assistant

## 2023-05-20 NOTE — Telephone Encounter (Signed)
*  STAT* If patient is at the pharmacy, call can be transferred to refill team.   1. Which medications need to be refilled? (please list name of each medication and dose if known) amLODipine (NORVASC) 10 MG tablet ; atorvastatin (LIPITOR) 40 MG tablet ; ezetimibe (ZETIA) 10 MG tablet ; furosemide (LASIX) 20 MG tablet ;  isosorbide mononitrate (IMDUR) 120 MG 24 hr tablet ;  metoprolol succinate (TOPROL-XL) 25 MG 24 hr tablet ;   2. Would you like to learn more about the convenience, safety, & potential cost savings by using the [facility]? No     3. Are you open to using the [facility] (Type [facility]. No  ).   4. Which pharmacy/location (including street and city if local pharmacy) is medication to be sent to? [facility] - [place], [place] [address]    5. Do they need a 30 day or 90 day supply? 90

## 2023-05-20 NOTE — Telephone Encounter (Signed)
Called pharmacy to inform them that pt is overdue for an appointment and pt needed to call our office to schedule an overdue appt with the Cardiologist before anymore refills. Pharmacy verbalized understanding.

## 2023-05-28 ENCOUNTER — Encounter: Payer: Medicare Other | Admitting: Pharmacist

## 2023-07-01 ENCOUNTER — Ambulatory Visit: Payer: Medicare Other | Admitting: Gastroenterology

## 2023-07-05 ENCOUNTER — Other Ambulatory Visit: Payer: Self-pay | Admitting: Internal Medicine

## 2023-07-13 ENCOUNTER — Ambulatory Visit (INDEPENDENT_AMBULATORY_CARE_PROVIDER_SITE_OTHER): Payer: Medicare Other | Admitting: Family Medicine

## 2023-07-13 ENCOUNTER — Encounter: Payer: Self-pay | Admitting: Family Medicine

## 2023-07-13 VITALS — BP 138/76 | HR 75 | Temp 97.8°F | Ht 66.0 in | Wt 139.0 lb

## 2023-07-13 DIAGNOSIS — I10 Essential (primary) hypertension: Secondary | ICD-10-CM | POA: Diagnosis not present

## 2023-07-13 DIAGNOSIS — E119 Type 2 diabetes mellitus without complications: Secondary | ICD-10-CM | POA: Diagnosis not present

## 2023-07-13 DIAGNOSIS — D649 Anemia, unspecified: Secondary | ICD-10-CM | POA: Diagnosis not present

## 2023-07-13 DIAGNOSIS — Z23 Encounter for immunization: Secondary | ICD-10-CM | POA: Diagnosis not present

## 2023-07-13 DIAGNOSIS — I25119 Atherosclerotic heart disease of native coronary artery with unspecified angina pectoris: Secondary | ICD-10-CM | POA: Diagnosis not present

## 2023-07-13 DIAGNOSIS — Z780 Asymptomatic menopausal state: Secondary | ICD-10-CM

## 2023-07-13 DIAGNOSIS — E0590 Thyrotoxicosis, unspecified without thyrotoxic crisis or storm: Secondary | ICD-10-CM | POA: Diagnosis not present

## 2023-07-13 DIAGNOSIS — Z0000 Encounter for general adult medical examination without abnormal findings: Secondary | ICD-10-CM

## 2023-07-13 DIAGNOSIS — E7800 Pure hypercholesterolemia, unspecified: Secondary | ICD-10-CM

## 2023-07-13 DIAGNOSIS — R739 Hyperglycemia, unspecified: Secondary | ICD-10-CM

## 2023-07-13 DIAGNOSIS — G43109 Migraine with aura, not intractable, without status migrainosus: Secondary | ICD-10-CM | POA: Diagnosis not present

## 2023-07-13 DIAGNOSIS — Z131 Encounter for screening for diabetes mellitus: Secondary | ICD-10-CM

## 2023-07-13 MED ORDER — VENLAFAXINE HCL ER 150 MG PO CP24: ORAL_CAPSULE | ORAL | 3 refills | Status: DC

## 2023-07-13 MED ORDER — ESOMEPRAZOLE MAGNESIUM 20 MG PO CPDR: 20.0000 mg | DELAYED_RELEASE_CAPSULE | Freq: Every day | ORAL | 3 refills | Status: DC

## 2023-07-13 MED ORDER — OXYBUTYNIN CHLORIDE ER 10 MG PO TB24: 10.0000 mg | ORAL_TABLET | Freq: Every day | ORAL | 3 refills | Status: DC

## 2023-07-13 MED ORDER — POTASSIUM CHLORIDE ER 10 MEQ PO CPCR: ORAL_CAPSULE | ORAL | 3 refills | Status: DC

## 2023-07-13 NOTE — Addendum Note (Signed)
Addended by: Shelva Majestic on: 07/13/2023 05:01 PM   Modules accepted: Level of Service

## 2023-07-13 NOTE — Progress Notes (Signed)
Phone [phone]   Subjective:  Patient presents today for their annual physical. Chief complaint-noted.   See problem oriented charting- ROS- full  review of systems was completed and negative except for: Headaches-follows with Dr. Anne Hahn and plans to schedule, some vision issues- plans to see eye doctor  The following were reviewed and entered/updated in epic: Past Medical History:  Diagnosis Date   Anemia 12/22/2017   Arthritis    Chronic lower back pain    Cluster headache    Coronary artery disease    a. 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 non obs dz LHC 2007. c. Non obs dz (mild-mod) by [facility] 05/17/14 d. cath 05/26/17 mild non obstructive CAD   COVID-19 08/07/2019   Diverticulitis    a. Hx microperf 2012 - hospitalizated.   GERD (gastroesophageal reflux disease)    Hypercholesteremia    Hypertension    Overactive bladder 09/19/2014   Oxybutynin 5mg  XL--> 10mg    PVC's (premature ventricular contractions)    a. Hx of trigeminy/bigeminy.   Seizures (HCC)    Thyroid disease    hyperthyroid   UTI (lower urinary tract infection)    Patient Active Problem List   Diagnosis Date Noted   WPW (Wolff-Parkinson-White syndrome) 03/05/2020    Priority: High   Heart failure with reduced ejection fraction (HCC) 08/21/2019    Priority: High   COVID-19 08/07/2019    Priority: High   Hyperthyroidism 05/22/2017    Priority: High   Migraine 09/19/2014    Priority: High   Diabetes mellitus type II, controlled (HCC) 09/19/2014    Priority: High   Coronary artery disease involving native coronary artery of native heart with angina pectoris (HCC)     Priority: High   Seizures (HCC)     Priority: High   Overactive bladder 09/19/2014    Priority: Medium    Hypercholesteremia     Priority: Medium    GERD (gastroesophageal reflux disease) 10/04/2011    Priority: Medium    HTN (hypertension) 10/01/2011    Priority: Medium    Aortic atherosclerosis (HCC) 01/09/2020    Priority: Low    Hypokalemia 06/01/2014    Priority: Low   Arthritis     Priority: Low   Pain in left wrist 07/17/2021   Chest pain 02/03/2020   Anemia 12/22/2017   Past Surgical History:  Procedure Laterality Date   ABDOMINAL HYSTERECTOMY  1987   "partial"-still has ovaries   CARDIAC CATHETERIZATION  05/17/2014   CORONARY ANGIOPLASTY WITH STENT PLACEMENT  1995   "1"   JOINT REPLACEMENT     right knee   LEFT HEART CATH AND CORONARY ANGIOGRAPHY N/A 05/26/2017   Procedure: Left Heart Cath and Coronary Angiography;  Surgeon: Corky Crafts, MD;  Location: [facility] INVASIVE CV LAB;  Service: Cardiovascular;  Laterality: N/A;   LEFT HEART CATHETERIZATION WITH CORONARY ANGIOGRAM N/A 05/17/2014   Procedure: LEFT HEART CATHETERIZATION WITH CORONARY ANGIOGRAM;  Surgeon: Marykay Lex, MD;  Location: [facility] CATH LAB;  Service: Cardiovascular;  Laterality: N/A;   TOTAL KNEE ARTHROPLASTY Right 2005   TUBAL LIGATION  1970   VASCULAR SURGERY      Family History  Problem Relation Age of Onset   CAD Brother    Diabetes Brother    CAD Father    Diabetes Father    Diabetes Mother        father, sister, brothers   Diabetes Sister    Thyroid disease Sister    Heart attack Brother    Prostate  cancer Brother    Thyroid disease Sister    Diabetes Sister     Medications- reviewed and updated Current Outpatient Medications  Medication Sig Dispense Refill   acetaminophen (TYLENOL) 325 MG tablet Take 2 tablets (650 mg total) by mouth 4 (four) times daily. 30 tablet 0   amLODipine (NORVASC) 10 MG tablet TAKE 1 TABLET(10 MG) BY MOUTH DAILY. 15 tablet 0   aspirin EC 325 MG tablet Take 325 mg by mouth every other day.     atorvastatin (LIPITOR) 40 MG tablet TAKE ONE TABLET BY MOUTH DAILY AT 5 PM. PATIENT NEEDS APPOINTMENT BEFORE NEXT REFILL 15 tablet 0   Cholecalciferol (VITAMIN D3) 25 MCG (1000 UT) CAPS Take by mouth.     Cyanocobalamin (VITAMIN B-12) 3000 MCG SUBL Place 3,000 mg under the tongue daily.      dicyclomine (BENTYL) 20 MG tablet Take 1 tablet (20 mg total) by mouth daily as needed for spasms. 20 tablet 0   ezetimibe (ZETIA) 10 MG tablet TAKE ONE TABLET (10MG  TOTAL) BY MOUTH DAILY 15 tablet 0   furosemide (LASIX) 20 MG tablet TAKE ONE TABLET BY MOUTH DAILY AT 9 AM. Please call [phone] to schedule an over due appointment for future refills. Thank you. Final attempt. 15 tablet 0   isosorbide mononitrate (IMDUR) 120 MG 24 hr tablet TAKE ONE TABLET (120 MG TOTAL) BY MOUTH DAILY AT 9 AM (NEED APPOINTMENT FOR FURTHER REFILLS) 15 tablet 0   meclizine (ANTIVERT) 12.5 MG tablet Take 1 tablet (12.5 mg total) by mouth 3 (three) times daily as needed for dizziness. 30 tablet 0   methimazole (TAPAZOLE) 10 MG tablet Take 2 tablets (20 mg total) by mouth daily. 180 tablet 2   metoprolol succinate (TOPROL-XL) 25 MG 24 hr tablet Take 1 tablet (25 mg total) by mouth daily. 15 tablet 0   nitroGLYCERIN (NITROSTAT) 0.4 MG SL tablet PLACE 1 TABLET UNDER THE TONGUE EVERY 5 MINUTES FOR CHEST PAIN FOR UPTO 3 DOSES (Patient taking differently: PLACE 1 TABLET UNDER THE TONGUE EVERY 5 MINUTES FOR CHEST PAIN FOR UPTO 3 DOSES, keep on hand) 25 tablet 3   oxyCODONE-acetaminophen (PERCOCET) 10-325 MG tablet as needed. Taking as needed     vitamin C (VITAMIN C) 500 MG tablet Take 1 tablet (500 mg total) by mouth 2 (two) times daily. 14 tablet 0   Vitamin E 180 MG CAPS Take 2 capsules by mouth daily.     zinc sulfate 220 (50 Zn) MG capsule Take 1 capsule (220 mg total) by mouth daily. 7 capsule 0   esomeprazole (NEXIUM) 20 MG capsule Take 1 capsule (20 mg total) by mouth daily at 12 noon. 90 capsule 3   oxybutynin (DITROPAN-XL) 10 MG 24 hr tablet Take 1 tablet (10 mg total) by mouth daily. 90 tablet 3   potassium chloride (MICRO-K) 10 MEQ CR capsule TAKE 1 CAPSULE(10 MEQ) BY MOUTH TWICE DAILY 180 capsule 3   venlafaxine XR (EFFEXOR-XR) 150 MG 24 hr capsule TAKE ONE CAPSULE BY MOUTH DAILY AT 9 PM AT BEDTIME 90 capsule 3    No current facility-administered medications for this visit.    Allergies-reviewed and updated Allergies  Allergen Reactions   Crestor [Rosuvastatin] Other (See Comments)    Hot flashes and severe cramps   Sulfa Drugs Cross Reactors Shortness Of Breath and Palpitations    Social History   Social History Narrative   Separated. 4 children from first marriage. 16 grandkids.       Retired from  [employer] for 25 years, went to [employer] for 5 years. Retired 2003 after MI      Hobbies: babysit/active with children      Right-handed      Caffeine: 24 oz soda per day      Objective  Objective:  BP 138/76   Pulse 75   Temp 97.8 F (36.6 C)   Ht 5\' 6"  (1.676 m)   Wt 139 lb (63 kg)   SpO2 94%   BMI 22.44 kg/m  Gen: NAD, resting comfortably HEENT: Mucous membranes are moist. Oropharynx normal Neck: no thyromegaly CV: RRR no murmurs rubs or gallops Lungs: CTAB no crackles, wheeze, rhonchi Abdomen: soft/nontender/nondistended/normal bowel sounds. No rebound or guarding.  Ext: no edema Skin: warm, dry Neuro: grossly normal, moves all extremities, PERRLA   Assessment and Plan   77 y.o. female presenting for annual physical.  Health Maintenance counseling: 1. Anticipatory guidance: Patient counseled regarding regular dental exams -wears dentures, eye doctor typically yearly but needs to schedule ,  avoiding smoking and second hand smoke , limiting alcohol to 1 beverage per day-never drinker , no illicit drugs .   2. Risk factor reduction:  Advised patient of need for regular exercise and diet rich and fruits and vegetables to reduce risk of heart attack and stroke.  Exercise-some stationary bike last year but needs to get restarted- doing some walking.  Diet/weight management-weight down 9 pounds in the last year-encouraged weight stabilization-she reports natural fluctuation in her weight Wt Readings from Last 3 Encounters:  07/13/23 139 lb (63 kg)  05/03/23 135 lb 12.8 oz  (61.6 kg)  04/28/23 139 lb 3.2 oz (63.1 kg)  3. Immunizations/screenings/ancillary studies-had a flu shot today.  Opts out of Prevnar 20 and Shingrix and COVID shot and Tetanus, Diphtheria, and Pertussis (Tdap)   Immunization History  Administered Date(s) Administered   Fluad Trivalent(High Dose 65+) 07/13/2023   Influenza, High Dose Seasonal PF 08/17/2016, 08/06/2017, 07/09/2022   Influenza,inj,Quad PF,6+ Mos 10/11/2015   Janssen (J&J) SARS-COV-2 Vaccination 03/10/2020   Pneumococcal Conjugate-13 10/11/2015   Pneumococcal Polysaccharide-23 11/19/2016   Td 12/25/1995  4. Cervical cancer screening- past formal age based screening recommendations. No vaginal bleeding or discharge 5. Breast cancer screening-  mammogram -  reports does this yearly with what sounds like [facility] still-technically past age based screening requirement 6. Colon cancer screening - scheduled in December to see gastroenterology- missed last appointment with car issues  7. Skin cancer screening-low risk due to melanin content.  advised regular sunscreen use. Denies worrisome, changing, or new skin lesions.  8. Birth control/STD check- postmenopausal/states not sexually active  9. Osteoporosis screening at 10- referred last 2 years but never completed-she [facility] to [facility]  10. Smoking associated screening - never smoker  Status of chronic or acute concerns   # Social update-greatgrandbaby due in November/October  # Diabetes-new diagnosis 07/09/2022 S: Medication:Diet controlled   Lab Results  Component Value Date   HGBA1C 6.6 (H) 04/28/2023   HGBA1C 6.6 (H) 11/02/2022   HGBA1C 6.5 07/09/2022   A/P: well controlled - continue without medicine -needs diabetes eye exam as well  #Nonobstructive CAD by cath in 2018 with angina thought primarily related to coronary vasospasm #hyperlipidemia #Aortic atherosclerosis S: Medication:Aspirin 325 mg, atorvastatin 40 mg (reported hot flashes and cramps on rosuvastatin 20  mg), Zetia 10 mg, Imdur 120 mg, metoprolol 25 mg extended release - occasional still with mild chest pain  Lab Results  Component Value Date  CHOL 117 07/09/2022   HDL 43.60 07/09/2022   LDLCALC 58 07/09/2022   LDLDIRECT 66.0 04/28/2023   TRIG 74.0 07/09/2022   CHOLHDL 3 07/09/2022  A/P: CAD with stable angina likely from vasospasm- continue current medications  Lipids Lipids hopefully stable- update lipid panel today. Continue current meds for now   #Wolff-Parkinson-White-follows with Dr. Lubertha Basque: Per cardiology cannot increase metoprolol due to Wolff-Parkinson-White. A/P: appears stable- plans to schedule cardiology follow up    #Heart failure with reduced ejection fraction.  Most recent ejection fraction improved to 55 to 60% in February 2021 S: Medication:Lasix 20 mg daily, also on potassium  Edema: none Weight gain:none Shortness of breath: none  A/P: heart failure appears euvolemic- continue current medications    #hypertension S: medication: Amlodipine 10 mg, Lasix 20 mg, Imdur 120 mg, metoprolol 25 mg extended release A/P: stable- continue current medicines    #hyperthyroidism-follows with endocrine Dr. Lonzo Cloud S: compliant On thyroid medication-methimazole 30 mg in the past-now down to 10 mg BID in sept 2023  A/P:labs stable in July and had endo follow up recently- continue current medications    #Migraines S: Medication:Effexor 150 mg extended release reportedly for headaches for years. - had seen Dr. Anne Hahn years ago- states headaches are similar to when she used to get seizures but she is not having seizures- but want to be proactive. Feels like headache(s) lays on left side of head shooting from back of head to front of head on left at times- always on left A/P: headache pattern with some change- she wants to see neurology Dr. Anne Hahn again - recurrent despite continuing Effexor which has helped in the past  Seizures [employer]) S: Patient has reported history of  seizures after injury in childhood following.  Had tolerated tramadol in the past for migraines without recurrent seizures.  No regular antiepileptic A/P: thankfully no recurrence   # GERD S:Medication: Esomeprazole 20 mg A/P: stable- continue current medicines  -takes multivitamin with B12 it sounds like   #Overactive bladder S: Medication: Oxybutynin 10 mg extended  release.  We discussed risks of confusion/falls  A/P: denies confusion or falls- we opted to continue current medications  . Still some urgency- but may trial off as not sure if helpful  #back pain- working with pain management on oxycodone-hydrocodone in past  #anemia in past- update CBC and iron stores  Recommended follow up: Return in about 4 months (around 11/12/2023) for followup or sooner if needed.Schedule b4 you leave. Future Appointments  Date Time Provider Department Center  10/05/2023  2:20 PM Shellia Cleverly, DO [facility]  11/08/2023  1:40 PM Shamleffer, Konrad Dolores, MD [facility] None  04/18/2024  9:15 AM [facility] ANNUAL WELLNESS VISIT 1 [facility]   Lab/Order associations: NOT fasting   ICD-10-CM   1. Preventative health care  Z00.00     2. Need for influenza vaccination  Z23 Flu Vaccine Trivalent High Dose (Fluad)    3. Controlled type 2 diabetes mellitus without complication, without long-term current use of insulin (HCC)  E11.9     4. Primary hypertension  I10     5. Hypercholesteremia  E78.00 Comprehensive metabolic panel    CBC with Differential/Platelet    Lipid panel    6. Hyperthyroidism  E05.90     7. Coronary artery disease involving native coronary artery of native heart with angina pectoris (HCC)  I25.119     8. Hyperglycemia  R73.9     9. Screening for diabetes mellitus  Z13.1  10. Migraine with aura and without status migrainosus, not intractable  G43.109 Ambulatory referral to Neurology    11. Postmenopausal  Z78.0 DG Bone Density    12. Anemia, unspecified  type  D64.9 IBC + Ferritin      Meds ordered this encounter  Medications   esomeprazole (NEXIUM) 20 MG capsule    Sig: Take 1 capsule (20 mg total) by mouth daily at 12 noon.    Dispense:  90 capsule    Refill:  3   oxybutynin (DITROPAN-XL) 10 MG 24 hr tablet    Sig: Take 1 tablet (10 mg total) by mouth daily.    Dispense:  90 tablet    Refill:  3   potassium chloride (MICRO-K) 10 MEQ CR capsule    Sig: TAKE 1 CAPSULE(10 MEQ) BY MOUTH TWICE DAILY    Dispense:  180 capsule    Refill:  3    While taaking furosemide- if you were to stop furosemide also stop this   venlafaxine XR (EFFEXOR-XR) 150 MG 24 hr capsule    Sig: TAKE ONE CAPSULE BY MOUTH DAILY AT 9 PM AT BEDTIME    Dispense:  90 capsule    Refill:  3    Return precautions advised.  Tana Conch, MD

## 2023-07-13 NOTE — Patient Instructions (Addendum)
Get diabetic eye exam scheduled.  Due for Tetanus, Diphtheria, and Pertussis (Tdap) at pharmacy and shingrix   We have placed a referral for you today to neurology [place]. In some cases you will see # listed below- you can call this if you have not heard within a week. If you do not see # listed- you should receive a mychart message or phone call within a week with the # to call directly- call that as soon as you get it. If you are having issues getting scheduled reach out to [place] again.   Trying to set up bone density at [facility] (where you get mammogram)  Please stop by lab before you go If you have mychart- we will send your results within 3 business days of [place] receiving them.  If you do not have mychart- we will call you about results within 5 business days of [place] receiving them.  *please also note that you will see labs on mychart as soon as they post. I will later go in and write notes on them- will say "notes from Dr. Durene Cal"    Recommended follow up: Return in about 4 months (around 11/12/2023) for followup or sooner if needed.Schedule b4 you leave.

## 2023-07-28 ENCOUNTER — Other Ambulatory Visit: Payer: Self-pay | Admitting: Internal Medicine

## 2023-08-04 ENCOUNTER — Other Ambulatory Visit: Payer: Self-pay | Admitting: Internal Medicine

## 2023-08-05 NOTE — Telephone Encounter (Signed)
Patient needs an appointment scheduled for additional refills patient was seeing Dr. Verdis Prime who is no longer here patient would need to be schedule to refill any pending medication

## 2023-08-11 ENCOUNTER — Ambulatory Visit: Payer: Medicare Other | Admitting: Internal Medicine

## 2023-10-04 DIAGNOSIS — G43909 Migraine, unspecified, not intractable, without status migrainosus: Secondary | ICD-10-CM | POA: Diagnosis not present

## 2023-10-04 DIAGNOSIS — I252 Old myocardial infarction: Secondary | ICD-10-CM | POA: Diagnosis not present

## 2023-10-04 DIAGNOSIS — I10 Essential (primary) hypertension: Secondary | ICD-10-CM | POA: Diagnosis not present

## 2023-10-04 DIAGNOSIS — E0590 Thyrotoxicosis, unspecified without thyrotoxic crisis or storm: Secondary | ICD-10-CM | POA: Diagnosis not present

## 2023-10-04 DIAGNOSIS — M19011 Primary osteoarthritis, right shoulder: Secondary | ICD-10-CM | POA: Diagnosis not present

## 2023-10-05 ENCOUNTER — Ambulatory Visit: Payer: Medicare Other | Admitting: Gastroenterology

## 2023-10-21 NOTE — Telephone Encounter (Signed)
Patient is scheduled to see Dr. Rosemary Holms 01/13 at 2:40 pm.

## 2023-11-08 ENCOUNTER — Ambulatory Visit: Payer: Medicare Other | Admitting: Cardiology

## 2023-11-08 ENCOUNTER — Ambulatory Visit: Payer: Medicare Other | Admitting: Internal Medicine

## 2023-11-09 ENCOUNTER — Ambulatory Visit: Payer: Medicare Other | Admitting: Cardiology

## 2023-11-11 ENCOUNTER — Ambulatory Visit: Payer: Medicare Other | Attending: Cardiology | Admitting: Cardiology

## 2023-11-11 ENCOUNTER — Encounter: Payer: Self-pay | Admitting: Cardiology

## 2023-11-11 VITALS — BP 130/82 | HR 70 | Resp 16 | Ht 66.0 in | Wt 139.8 lb

## 2023-11-11 DIAGNOSIS — I456 Pre-excitation syndrome: Secondary | ICD-10-CM

## 2023-11-11 DIAGNOSIS — I10 Essential (primary) hypertension: Secondary | ICD-10-CM

## 2023-11-11 DIAGNOSIS — I201 Angina pectoris with documented spasm: Secondary | ICD-10-CM | POA: Diagnosis not present

## 2023-11-11 DIAGNOSIS — I5032 Chronic diastolic (congestive) heart failure: Secondary | ICD-10-CM | POA: Diagnosis not present

## 2023-11-11 DIAGNOSIS — R0989 Other specified symptoms and signs involving the circulatory and respiratory systems: Secondary | ICD-10-CM

## 2023-11-11 DIAGNOSIS — E7800 Pure hypercholesterolemia, unspecified: Secondary | ICD-10-CM

## 2023-11-11 MED ORDER — ISOSORBIDE MONONITRATE ER 120 MG PO TB24: 120.0000 mg | ORAL_TABLET | Freq: Every day | ORAL | 3 refills | Status: DC

## 2023-11-11 MED ORDER — ATORVASTATIN CALCIUM 40 MG PO TABS: ORAL_TABLET | ORAL | 3 refills | Status: DC

## 2023-11-11 MED ORDER — NITROGLYCERIN 0.4 MG SL SUBL
SUBLINGUAL_TABLET | SUBLINGUAL | 6 refills | Status: DC
Start: 2023-11-11 — End: 2024-08-18

## 2023-11-11 MED ORDER — METOPROLOL SUCCINATE ER 25 MG PO TB24
25.0000 mg | ORAL_TABLET | Freq: Every day | ORAL | 3 refills | Status: DC
Start: 2023-11-11 — End: 2024-05-10

## 2023-11-11 MED ORDER — EZETIMIBE 10 MG PO TABS: 10.0000 mg | ORAL_TABLET | Freq: Every day | ORAL | 3 refills | Status: DC

## 2023-11-11 MED ORDER — FUROSEMIDE 20 MG PO TABS: ORAL_TABLET | ORAL | 3 refills | Status: DC

## 2023-11-11 MED ORDER — AMLODIPINE BESYLATE 10 MG PO TABS: ORAL_TABLET | ORAL | 3 refills | Status: DC

## 2023-11-11 MED ORDER — POTASSIUM CHLORIDE ER 10 MEQ PO CPCR
ORAL_CAPSULE | ORAL | 3 refills | Status: DC
Start: 2023-11-11 — End: 2024-05-01

## 2023-11-11 NOTE — Patient Instructions (Signed)
Medication Instructions:  Your physician recommends that you continue on your current medications as directed. Please refer to the Current Medication list given to you today.  *If you need a refill on your cardiac medications before your next appointment, please call your pharmacy*   Lab Work: none If you have labs (blood work) drawn today and your tests are completely normal, you will receive your results only by: MyChart Message (if you have MyChart) OR A paper copy in the mail If you have any lab test that is abnormal or we need to change your treatment, we will call you to review the results.   Testing/Procedures: Your physician has requested that you have a carotid duplex. This test is an ultrasound of the carotid arteries in your neck. It looks at blood flow through these arteries that supply the brain with blood. Allow one hour for this exam. There are no restrictions or special instructions.    Follow-Up: At [facility], you and your health needs are our priority.  As part of our continuing mission to provide you with exceptional heart care, we have created designated Provider Care Teams.  These Care Teams include your primary Cardiologist (physician) and Advanced Practice Providers (APPs -  Physician Assistants and Nurse Practitioners) who all work together to provide you with the care you need, when you need it.  We recommend signing up for the patient portal called "MyChart".  Sign up information is provided on this After Visit Summary.  MyChart is used to connect with patients for Virtual Visits (Telemedicine).  Patients are able to view lab/test results, encounter notes, upcoming appointments, etc.  Non-urgent messages can be sent to your provider as well.   To learn more about what you can do with MyChart, go to [email or web address].    Your next appointment:   12 month(s)  Provider:   Yates Decamp, MD     Other Instructions
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
## 2023-11-12 ENCOUNTER — Ambulatory Visit: Payer: Medicare Other | Admitting: Family Medicine

## 2023-11-17 ENCOUNTER — Other Ambulatory Visit: Payer: Self-pay

## 2023-11-17 DIAGNOSIS — I5032 Chronic diastolic (congestive) heart failure: Secondary | ICD-10-CM

## 2023-11-17 MED ORDER — FUROSEMIDE 20 MG PO TABS: ORAL_TABLET | ORAL | 3 refills | Status: DC

## 2023-11-18 ENCOUNTER — Telehealth: Payer: Self-pay

## 2023-11-18 ENCOUNTER — Other Ambulatory Visit: Payer: Self-pay

## 2023-11-18 DIAGNOSIS — E0590 Thyrotoxicosis, unspecified without thyrotoxic crisis or storm: Secondary | ICD-10-CM

## 2023-11-18 MED ORDER — METHIMAZOLE 10 MG PO TABS: 20.0000 mg | ORAL_TABLET | Freq: Every day | ORAL | 0 refills | Status: AC

## 2023-11-18 NOTE — Telephone Encounter (Signed)
VM left for clarification of instructions for Rx Methimazole. RN returned call on hold over 5 minutes noone came to phone.

## 2023-11-23 ENCOUNTER — Ambulatory Visit (HOSPITAL_COMMUNITY)
Admission: RE | Admit: 2023-11-23 | Discharge: 2023-11-23 | Disposition: A | Discharge status: Home / Self Care | Payer: Medicare Other | Source: Ambulatory Visit | Attending: Cardiology | Admitting: Cardiology

## 2023-11-23 DIAGNOSIS — R0989 Other specified symptoms and signs involving the circulatory and respiratory systems: Secondary | ICD-10-CM | POA: Diagnosis not present

## 2023-11-25 NOTE — Progress Notes (Signed)
Very mild carotid disease. No further evaluation needed

## 2023-11-26 ENCOUNTER — Telehealth: Payer: Self-pay

## 2023-11-26 ENCOUNTER — Encounter: Payer: Self-pay | Admitting: Family Medicine

## 2023-11-26 ENCOUNTER — Ambulatory Visit (INDEPENDENT_AMBULATORY_CARE_PROVIDER_SITE_OTHER): Payer: Medicare Other | Admitting: Family Medicine

## 2023-11-26 VITALS — BP 120/70 | HR 67 | Temp 98.1°F | Ht 66.0 in | Wt 141.2 lb

## 2023-11-26 DIAGNOSIS — I10 Essential (primary) hypertension: Secondary | ICD-10-CM

## 2023-11-26 DIAGNOSIS — R569 Unspecified convulsions: Secondary | ICD-10-CM

## 2023-11-26 DIAGNOSIS — E119 Type 2 diabetes mellitus without complications: Secondary | ICD-10-CM

## 2023-11-26 DIAGNOSIS — E0590 Thyrotoxicosis, unspecified without thyrotoxic crisis or storm: Secondary | ICD-10-CM

## 2023-11-26 DIAGNOSIS — I700 Atherosclerosis of aorta: Secondary | ICD-10-CM | POA: Diagnosis not present

## 2023-11-26 DIAGNOSIS — E7800 Pure hypercholesterolemia, unspecified: Secondary | ICD-10-CM

## 2023-11-26 DIAGNOSIS — Z780 Asymptomatic menopausal state: Secondary | ICD-10-CM | POA: Diagnosis not present

## 2023-11-26 NOTE — Patient Instructions (Addendum)
[facility]- call for mammogram and bone density Schedule an appointment by calling [phone].  Please stop by lab before you go If you have mychart- we will send your results within 3 business days of [place] receiving them.  If you do not have mychart- we will call you about results within 5 business days of [place] receiving them.  *please also note that you will see labs on mychart as soon as they post. I will later go in and write notes on them- will say "notes from Dr. Durene Cal"   Recommended follow up: Return in about 4 months (around 03/25/2024) for followup or sooner if needed.Schedule b4 you leave.

## 2023-11-26 NOTE — Addendum Note (Signed)
Addended by: Shelva Majestic on: 11/26/2023 05:06 PM   Modules accepted: Level of Service

## 2023-11-26 NOTE — Telephone Encounter (Signed)
Reached out to pt to check on her due to missed visit today, was unable to leave vm as recording stated vm was full.
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
## 2023-11-29 LAB — CBC WITH DIFFERENTIAL/PLATELET
Absolute Lymphocytes: 2541 {cells}/uL (ref 850–3900)
Absolute Monocytes: 340 {cells}/uL (ref 200–950)
Basophils Absolute: 30 {cells}/uL (ref 0–200)
Basophils Relative: 0.7 %
Eosinophils Absolute: 189 {cells}/uL (ref 15–500)
Eosinophils Relative: 4.4 %
HCT: 34.9 % — ABNORMAL LOW (ref 35.0–45.0)
Hemoglobin: 11.4 g/dL — ABNORMAL LOW (ref 11.7–15.5)
MCH: 32.1 pg (ref 27.0–33.0)
MCHC: 32.7 g/dL (ref 32.0–36.0)
MCV: 98.3 fL (ref 80.0–100.0)
MPV: 12.2 fL (ref 7.5–12.5)
Monocytes Relative: 7.9 %
Neutro Abs: 1200 {cells}/uL — ABNORMAL LOW (ref 1500–7800)
Neutrophils Relative %: 27.9 %
Platelets: 183 10*3/uL (ref 140–400)
RBC: 3.55 10*6/uL — ABNORMAL LOW (ref 3.80–5.10)
RDW: 12.5 % (ref 11.0–15.0)
Total Lymphocyte: 59.1 %
WBC: 4.3 10*3/uL (ref 3.8–10.8)

## 2023-11-29 LAB — COMPREHENSIVE METABOLIC PANEL
AG Ratio: 1.4 (calc) (ref 1.0–2.5)
ALT: 17 U/L (ref 6–29)
AST: 17 U/L (ref 10–35)
Albumin: 4 g/dL (ref 3.6–5.1)
Alkaline phosphatase (APISO): 59 U/L (ref 37–153)
BUN/Creatinine Ratio: 16 (calc) (ref 6–22)
BUN: 17 mg/dL (ref 7–25)
CO2: 30 mmol/L (ref 20–32)
Calcium: 9.7 mg/dL (ref 8.6–10.4)
Chloride: 106 mmol/L (ref 98–110)
Creat: 1.07 mg/dL — ABNORMAL HIGH (ref 0.60–1.00)
Globulin: 2.9 g/dL (ref 1.9–3.7)
Glucose, Bld: 85 mg/dL (ref 65–99)
Potassium: 4 mmol/L (ref 3.5–5.3)
Sodium: 142 mmol/L (ref 135–146)
Total Bilirubin: 0.6 mg/dL (ref 0.2–1.2)
Total Protein: 6.9 g/dL (ref 6.1–8.1)

## 2023-11-29 LAB — T4, FREE: Free T4: 0.9 ng/dL (ref 0.8–1.8)

## 2023-11-29 LAB — MICROALBUMIN / CREATININE URINE RATIO
Creatinine, Urine: 244 mg/dL (ref 20–275)
Microalb Creat Ratio: 14 mg/g{creat} (ref <30)
Microalb, Ur: 3.5 mg/dL

## 2023-11-29 LAB — TSH: TSH: 4.58 m[IU]/L — ABNORMAL HIGH (ref 0.40–4.50)

## 2023-11-29 LAB — HEMOGLOBIN A1C
Hgb A1c MFr Bld: 5.8 %{Hb} — ABNORMAL HIGH (ref <5.7)
Mean Plasma Glucose: 120 mg/dL
eAG (mmol/L): 6.6 mmol/L

## 2023-11-29 LAB — T3, FREE: T3, Free: 3.2 pg/mL (ref 2.3–4.2)

## 2023-12-14 ENCOUNTER — Other Ambulatory Visit: Payer: Self-pay

## 2023-12-14 MED ORDER — ESOMEPRAZOLE MAGNESIUM 20 MG PO CPDR: 20.0000 mg | DELAYED_RELEASE_CAPSULE | Freq: Every day | ORAL | 3 refills | Status: AC

## 2024-01-11 ENCOUNTER — Other Ambulatory Visit: Payer: Self-pay

## 2024-01-11 DIAGNOSIS — I5032 Chronic diastolic (congestive) heart failure: Secondary | ICD-10-CM

## 2024-01-11 MED ORDER — FUROSEMIDE 20 MG PO TABS
ORAL_TABLET | ORAL | 3 refills | Status: DC
Start: 2024-01-11 — End: 2024-08-14

## 2024-01-28 ENCOUNTER — Other Ambulatory Visit: Payer: Self-pay | Admitting: Family Medicine

## 2024-02-08 ENCOUNTER — Other Ambulatory Visit: Payer: Self-pay | Admitting: Family Medicine

## 2024-02-08 NOTE — Telephone Encounter (Signed)
 Copied from CRM [id number]. Topic: Clinical - Medication Refill >> Feb 08, 2024  4:13 PM [place] E wrote: Most Recent Primary Care Visit:  Provider: Almira Jaeger  Department: [facility]  Visit Type: OFFICE VISIT  Date: 11/26/2023  Medication: oxybutynin (DITROPAN-XL) 10 MG 24 hr tablet  Has the patient contacted their pharmacy? Yes, pharmacy called to refill it. (Agent: If no, request that the patient contact the pharmacy for the refill. If patient does not wish to contact the pharmacy document the reason why and proceed with request.) (Agent: If yes, when and what did the pharmacy advise?)  Is this the correct pharmacy for this prescription? Yes If no, delete pharmacy and type the correct one.  This is the patient's preferred pharmacy:   [facility] - [place], [place] - [address] [place] [address] Phone: [phone] Fax: [phone]   Has the prescription been filled recently? No  Is the patient out of the medication? No, Sam at [facility] stated patient will be out of medication on April 21st.  Has the patient been seen for an appointment in the last year OR does the patient have an upcoming appointment? Yes  Can we respond through MyChart? No  Agent: Please be advised that Rx refills may take up to 3 business days. We ask that you follow-up with your pharmacy.

## 2024-03-10 ENCOUNTER — Other Ambulatory Visit: Payer: Self-pay | Admitting: Internal Medicine

## 2024-03-10 DIAGNOSIS — E0590 Thyrotoxicosis, unspecified without thyrotoxic crisis or storm: Secondary | ICD-10-CM

## 2024-03-27 ENCOUNTER — Ambulatory Visit: Payer: Medicare Other | Admitting: Family Medicine

## 2024-04-06 ENCOUNTER — Other Ambulatory Visit: Payer: Self-pay | Admitting: Internal Medicine

## 2024-04-06 DIAGNOSIS — E0590 Thyrotoxicosis, unspecified without thyrotoxic crisis or storm: Secondary | ICD-10-CM

## 2024-04-12 ENCOUNTER — Other Ambulatory Visit: Payer: Self-pay | Admitting: Family Medicine

## 2024-04-18 ENCOUNTER — Ambulatory Visit (INDEPENDENT_AMBULATORY_CARE_PROVIDER_SITE_OTHER): Payer: Medicare Other

## 2024-04-18 VITALS — Ht 66.0 in | Wt 141.0 lb

## 2024-04-18 DIAGNOSIS — Z0000 Encounter for general adult medical examination without abnormal findings: Secondary | ICD-10-CM

## 2024-04-18 DIAGNOSIS — E7800 Pure hypercholesterolemia, unspecified: Secondary | ICD-10-CM | POA: Diagnosis not present

## 2024-04-18 NOTE — Patient Instructions (Signed)
 [name] , Thank you for taking time out of your busy schedule to complete your Annual Wellness Visit with me. I enjoyed our conversation and look forward to speaking with you again next year. I, as well as your care team,  appreciate your ongoing commitment to your health goals. Please review the following plan we discussed and let me know if I can assist you in the future. Your Game plan/ To Do List    Referrals: If you haven't heard from the office you've been referred to, please reach out to them at the phone provided.   Follow up Visits: Next Medicare AWV with our clinical staff: 04/24/25   Have you seen your provider in the last 6 months (3 months if uncontrolled diabetes)? No Next Office Visit with your provider: Pt stated she will make appt at later date   Clinician Recommendations:  Aim for 30 minutes of exercise or brisk walking, 6-8 glasses of water, and 5 servings of fruits and vegetables each day.        This is a list of the screening recommended for you and due dates:  Health Maintenance  Topic Date Due   Eye exam for diabetics  Never done   Zoster (Shingles) Vaccine (1 of 2) Never done   DEXA scan (bone density measurement)  Never done   COVID-19 Vaccine (2 - 2024-25 season) 06/27/2023   Medicare Annual Wellness Visit  04/12/2024   Hemoglobin A1C  05/25/2024   Flu Shot  05/26/2024   Yearly kidney function blood test for diabetes  11/25/2024   Yearly kidney health urinalysis for diabetes  11/25/2024   Complete foot exam   11/25/2024   Pneumococcal Vaccine for age over 40  Completed   Hepatitis C Screening  Completed   HPV Vaccine  Aged Out   Meningitis B Vaccine  Aged Out   DTaP/Tdap/Td vaccine  Discontinued    Advanced directives: (Declined) Advance directive discussed with you today. Even though you declined this today, please call our office should you change your mind, and we can give you the proper paperwork for you to fill out. Advance Care Planning is  important because it:  [x]  Makes sure you receive the medical care that is consistent with your values, goals, and preferences  [x]  It provides guidance to your family and loved ones and reduces their decisional burden about whether or not they are making the right decisions based on your wishes.  Follow the link provided in your after visit summary or read over the paperwork we have mailed to you to help you started getting your Advance Directives in place. If you need assistance in completing these, please reach out to us  so that we can help you!  See attachments for Preventive Care and Fall Prevention Tips.

## 2024-04-18 NOTE — Progress Notes (Signed)
 Subjective:   [name] is a 78 y.o. who presents for a Medicare Wellness preventive visit.  As a reminder, Annual Wellness Visits don't include a physical exam, and some assessments may be limited, especially if this visit is performed virtually. We may recommend an in-person follow-up visit with your provider if needed.  Visit Complete: Virtual I connected with  [name] on 04/18/24 by a audio enabled telemedicine application and verified that I am speaking with the correct person using two identifiers.  Patient Location: Home  Provider Location: Office/Clinic  I discussed the limitations of evaluation and management by telemedicine. The patient expressed understanding and agreed to proceed.  Vital Signs: Because this visit was a virtual/telehealth visit, some criteria may be missing or patient reported. Any vitals not documented were not able to be obtained and vitals that have been documented are patient reported.  VideoDeclined- This patient declined Librarian, academic. Therefore the visit was completed with audio only.  Persons Participating in Visit: Patient.  AWV Questionnaire: No: Patient Medicare AWV questionnaire was not completed prior to this visit.  Cardiac Risk Factors include: advanced age (>67men, >75 women);dyslipidemia;hypertension     Objective:    Today's Vitals   04/18/24 0744 04/18/24 0745  Weight: 141 lb (64 kg)   Height: 5' 6 (1.676 m)   PainSc:  10-Worst pain ever   Body mass index is 22.76 kg/m.     04/18/2024    7:49 AM 04/19/2023   12:02 AM 04/13/2023    9:49 AM 11/18/2022    1:26 PM 06/07/2022   12:09 AM 05/12/2022   10:05 AM 02/06/2021    2:18 PM  Advanced Directives  Does Patient Have a Medical Advance Directive? No No No No No No No  Would patient like information on creating a medical advance directive? No - Patient declined  No - Patient declined No - Patient declined Yes (ED - Information included in  AVS) No - Patient declined Yes (MAU/Ambulatory/Procedural Areas - Information given)    Current Medications (verified) Outpatient Encounter Medications as of 04/18/2024  Medication Sig   acetaminophen  (TYLENOL ) 325 MG tablet Take 2 tablets (650 mg total) by mouth 4 (four) times daily.   amLODipine  (NORVASC ) 10 MG tablet TAKE 1 TABLET(10 MG) BY MOUTH DAILY.   aspirin  EC 325 MG tablet Take 325 mg by mouth every other day.   atorvastatin  (LIPITOR) 40 MG tablet TAKE ONE TABLET BY MOUTH DAILY AT 5 PM. PATIENT NEEDS APPOINTMENT BEFORE NEXT REFILL   Cholecalciferol  (VITAMIN D3) 25 MCG (1000 UT) CAPS Take by mouth.   Cyanocobalamin  (VITAMIN B-12) 3000 MCG SUBL Place 3,000 mg under the tongue daily.   dicyclomine  (BENTYL ) 20 MG tablet Take 1 tablet (20 mg total) by mouth daily as needed for spasms.   esomeprazole  (NEXIUM ) 20 MG capsule Take 1 capsule (20 mg total) by mouth daily at 12 noon.   ezetimibe  (ZETIA ) 10 MG tablet Take 1 tablet (10 mg total) by mouth daily.   furosemide  (LASIX ) 20 MG tablet TAKE ONE TABLET BY MOUTH DAILY AT 9 AM   isosorbide  mononitrate (IMDUR ) 120 MG 24 hr tablet Take 1 tablet (120 mg total) by mouth daily.   methimazole  (TAPAZOLE ) 10 MG tablet Take 2 tablets (20 mg total) by mouth daily.   metoprolol  succinate (TOPROL -XL) 25 MG 24 hr tablet Take 1 tablet (25 mg total) by mouth daily.   nitroGLYCERIN  (NITROSTAT ) 0.4 MG SL tablet PLACE 1 TABLET UNDER THE TONGUE EVERY 5  MINUTES FOR CHEST PAIN FOR UPTO 3 DOSES   oxybutynin  (DITROPAN -XL) 10 MG 24 hr tablet TAKE ONE TABLET (10MG  TOTAL) BY MOUTH DAILY AT 9 AM   oxyCODONE -acetaminophen  (PERCOCET) 10-325 MG tablet as needed. Taking as needed   potassium chloride  (MICRO-K ) 10 MEQ CR capsule TAKE 1 CAPSULE(10 MEQ) BY MOUTH TWICE DAILY   venlafaxine  XR (EFFEXOR -XR) 150 MG 24 hr capsule TAKE ONE CAPSULE BY MOUTH DAILY AT 9 PM AT BEDTIME   vitamin C  (VITAMIN C ) 500 MG tablet Take 1 tablet (500 mg total) by mouth 2 (two) times daily.    Vitamin E  180 MG CAPS Take 2 capsules by mouth daily.   zinc  sulfate 220 (50 Zn) MG capsule Take 1 capsule (220 mg total) by mouth daily.   meclizine  (ANTIVERT ) 12.5 MG tablet Take 1 tablet (12.5 mg total) by mouth 3 (three) times daily as needed for dizziness. (Patient not taking: Reported on 04/18/2024)   No facility-administered encounter medications on file as of 04/18/2024.    Allergies (verified) Crestor  [rosuvastatin ] and Sulfa drugs cross reactors   History: Past Medical History:  Diagnosis Date   Anemia 12/22/2017   Arthritis    Chronic lower back pain    Cluster headache    Coronary artery disease    a. 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 non obs dz LHC 2007. c. Non obs dz (mild-mod) by [facility] 05/17/14 d. cath 05/26/17 mild non obstructive CAD   COVID-19 08/07/2019   Diverticulitis    a. Hx microperf 2012 - hospitalizated.   GERD (gastroesophageal reflux disease)    Hypercholesteremia    Hypertension    Overactive bladder 09/19/2014   Oxybutynin  5mg  XL--> 10mg    PVC's (premature ventricular contractions)    a. Hx of trigeminy/bigeminy.   Seizures (HCC)    Thyroid  disease    hyperthyroid   UTI (lower urinary tract infection)    Past Surgical History:  Procedure Laterality Date   ABDOMINAL HYSTERECTOMY  1987   partial-still has ovaries   CARDIAC CATHETERIZATION  05/17/2014   CORONARY ANGIOPLASTY WITH STENT PLACEMENT  1995   1   JOINT REPLACEMENT     right knee   LEFT HEART CATH AND CORONARY ANGIOGRAPHY N/A 05/26/2017   Procedure: Left Heart Cath and Coronary Angiography;  Surgeon: Dann Candyce RAMAN, MD;  Location[facility] INVASIVE CV LAB;  Service: Cardiovascular;  Laterality: N/A;   LEFT HEART CATHETERIZATION WITH CORONARY ANGIOGRAM N/A 05/17/2014   Procedure: LEFT HEART CATHETERIZATION WITH CORONARY ANGIOGRAM;  Surgeon: Alm LELON Clay, MD;  Location: [facility] CATH LAB;  Service: Cardiovascular;  Laterality: N/A;   TOTAL KNEE ARTHROPLASTY Right 2005   TUBAL LIGATION  1970   VASCULAR  SURGERY     Family History  Problem Relation Age of Onset   CAD Brother    Diabetes Brother    CAD Father    Diabetes Father    Diabetes Mother        father, sister, brothers   Diabetes Sister    Thyroid  disease Sister    Heart attack Brother    Prostate cancer Brother    Thyroid  disease Sister    Diabetes Sister    Social History   Socioeconomic History   Marital status: Widowed    Spouse name: Not on file   Number of children: 4   Years of education: 4   Highest education level: Not on file  Occupational History    Comment: retired  Tobacco Use   Smoking status: Never  Smokeless tobacco: Never  Vaping Use   Vaping status: Never Used  Substance and Sexual Activity   Alcohol use: No   Drug use: No   Sexual activity: Never  Other Topics Concern   Not on file  Social History Narrative   Separated. 4 children from first marriage. 16 grandkids.       Retired from [employer] for 25 years, went to [employer] for 5 years. Retired 2003 after MI      Hobbies: babysit/active with children      Right-handed      Caffeine: 24 oz soda per day      Social Drivers of Health   Financial Resource Strain: Low Risk  (04/18/2024)   Overall Financial Resource Strain (CARDIA)    Difficulty of Paying Living Expenses: Not hard at all  Food Insecurity: No Food Insecurity (04/18/2024)   Hunger Vital Sign    Worried About Running Out of Food in the Last Year: Never true    Ran Out of Food in the Last Year: Never true  Transportation Needs: No Transportation Needs (04/18/2024)   PRAPARE - Administrator, Civil Service (Medical): No    Lack of Transportation (Non-Medical): No  Physical Activity: Insufficiently Active (04/18/2024)   Exercise Vital Sign    Days of Exercise per Week: 5 days    Minutes of Exercise per Session: 20 min  Stress: No Stress Concern Present (04/18/2024)   [employer] of Occupational Health - Occupational Stress Questionnaire    Feeling of  Stress: Not at all  Social Connections: Moderately Isolated (04/18/2024)   Social Connection and Isolation Panel    Frequency of Communication with Friends and Family: Twice a week    Frequency of Social Gatherings with Friends and Family: More than three times a week    Attends Religious Services: More than 4 times per year    Active Member of [employer] or Organizations: No    Attends Banker Meetings: Never    Marital Status: Widowed    Tobacco Counseling Counseling given: Not Answered    Clinical Intake:  Pre-visit preparation completed: Yes  Pain : 0-10 Pain Score: 10-Worst pain ever Pain Type: Chronic pain Pain Location: Back Pain Descriptors / Indicators: Aching Pain Onset: More than a month ago Pain Frequency: Intermittent     BMI - recorded: 22.76 Nutritional Status: BMI of 19-24  Normal Diabetes: Yes CBG done?: No Did pt. bring in CBG monitor from home?: No  Lab Results  Component Value Date   HGBA1C 5.8 (H) 11/26/2023   HGBA1C 6.6 (H) 04/28/2023   HGBA1C 6.6 (H) 11/02/2022     How often do you need to have someone help you when you read instructions, pamphlets, or other written materials from your doctor or pharmacy?: 1 - Never  Interpreter Needed?: No  Information entered by :: Ellouise Haws, LPN   Activities of Daily Living     04/18/2024    7:46 AM  In your present state of health, do you have any difficulty performing the following activities:  Hearing? 1  Comment HOH  Vision? 0  Difficulty concentrating or making decisions? 0  Walking or climbing stairs? 1  Comment avoid stairs  Dressing or bathing? 0  Doing errands, shopping? 0  Preparing Food and eating ? N  Using the Toilet? N  In the past six months, have you accidently leaked urine? N  Do you have problems with loss of bowel control? N  Managing  your Medications? N  Managing your Finances? N  Housekeeping or managing your Housekeeping? N    Patient Care Team: Katrinka Garnette KIDD, MD as PCP - General (Family Medicine) Ladona Heinz, MD as PCP - Cardiology (Cardiology) Nicholaus Sherlean CROME, [facility] (Inactive) (Pharmacist) Trudy Vaughan HERO, MD as Referring Physician (Family Medicine)  I have updated your Care Teams any recent Medical Services you may have received from other providers in the past year.     Assessment:   This is a routine wellness examination for [name].  Hearing/Vision screen Hearing Screening - Comments:: Pt HOH  Vision Screening - Comments:: Pt stated she will follow up eye provider that her sister goes to will update name at next appt    Goals Addressed             This Visit's Progress    Patient Stated       Continue to exercise        Depression Screen     04/18/2024    7:52 AM 04/13/2023    9:48 AM 05/12/2022   10:04 AM 02/06/2021    2:13 PM 11/29/2020    1:49 PM 02/14/2020   10:04 AM 01/09/2020    4:02 PM  PHQ 2/9 Scores  PHQ - 2 Score 0 0 1 0 0 0 2  PHQ- 9 Score      4 5    Fall Risk     04/18/2024    7:50 AM 04/13/2023    9:49 AM 09/16/2022   11:17 AM 05/12/2022   10:07 AM 02/06/2021    2:19 PM  Fall Risk   Falls in the past year? 0 1 1 0 0  Number falls in past yr: 0 1 1 0 0  Injury with Fall? 0 1 1 0 0  Comment  right hand sprung     Risk for fall due to : Impaired mobility History of fall(s);Impaired mobility;Impaired balance/gait;Impaired vision History of fall(s) Impaired vision;Impaired balance/gait Impaired vision  Follow up Falls prevention discussed Falls prevention discussed Falls evaluation completed  Falls prevention discussed  Falls prevention discussed      Data saved with a previous flowsheet row definition    MEDICARE RISK AT HOME:  Medicare Risk at Home Any stairs in or around the home?: No If so, are there any without handrails?: No Home free of loose throw rugs in walkways, pet beds, electrical cords, etc?: Yes Adequate lighting in your home to reduce risk of falls?: Yes Life alert?: No Use  of a cane, walker or w/c?: No Grab bars in the bathroom?: No Shower chair or bench in shower?: No Elevated toilet seat or a handicapped toilet?: No  TIMED UP AND GO:  Was the test performed?  No  Cognitive Function: 6CIT completed    11/02/2022    4:41 PM  MMSE - Mini Mental State Exam  Orientation to time 3  Orientation to Place 5  Registration 3  Attention/ Calculation 3  Recall 0  Language- name 2 objects 2  Language- repeat 1  Language- follow 3 step command 3  Language- read & follow direction 1  Write a sentence 1  Copy design 0  Total score 22        04/15/2023    2:56 PM 05/12/2022   10:09 AM 02/06/2021    2:21 PM 01/09/2020    4:02 PM  6CIT Screen  What Year? 0 points 0 points 0 points 0 points  What month? 0  points 0 points 0 points 0 points  What time? 0 points 0 points  0 points  Count back from 20 0 points 0 points 2 points 0 points  Months in reverse 0 points 0 points 0 points 0 points  Repeat phrase 0 points 6 points 8 points 0 points  Total Score 0 points 6 points  0 points    Immunizations Immunization History  Administered Date(s) Administered   Fluad Trivalent(High Dose 65+) 07/13/2023   Influenza, High Dose Seasonal PF 08/17/2016, 08/06/2017, 07/09/2022   Influenza,inj,Quad PF,6+ Mos 10/11/2015   Janssen (J&J) SARS-COV-2 Vaccination 03/10/2020   Pneumococcal Conjugate-13 10/11/2015   Pneumococcal Polysaccharide-23 11/19/2016   Td 12/25/1995    Screening Tests Health Maintenance  Topic Date Due   OPHTHALMOLOGY EXAM  Never done   Zoster Vaccines- Shingrix (1 of 2) Never done   DEXA SCAN  Never done   COVID-19 Vaccine (2 - 2024-25 season) 06/27/2023   HEMOGLOBIN A1C  05/25/2024   INFLUENZA VACCINE  05/26/2024   Diabetic kidney evaluation - eGFR measurement  11/25/2024   Diabetic kidney evaluation - Urine ACR  11/25/2024   FOOT EXAM  11/25/2024   Medicare Annual Wellness (AWV)  04/18/2025   Pneumococcal Vaccine: 50+ Years  Completed    Hepatitis C Screening  Completed   Hepatitis B Vaccines  Aged Out   HPV VACCINES  Aged Out   Meningococcal B Vaccine  Aged Out   DTaP/Tdap/Td  Discontinued    Health Maintenance  Health Maintenance Due  Topic Date Due   OPHTHALMOLOGY EXAM  Never done   Zoster Vaccines- Shingrix (1 of 2) Never done   DEXA SCAN  Never done   COVID-19 Vaccine (2 - 2024-25 season) 06/27/2023   Health Maintenance Items Addressed: See Nurse Notes at the end of this note  Additional Screening:  Vision Screening: Recommended annual ophthalmology exams for early detection of glaucoma and other disorders of the eye. Would you like a referral to an eye doctor? No    Dental Screening: Recommended annual dental exams for proper oral hygiene  Community Resource Referral / Chronic Care Management: CRR required this visit?  No   CCM required this visit?  No   Plan:    I have personally reviewed and noted the following in the patient's chart:   Medical and social history Use of alcohol, tobacco or illicit drugs  Current medications and supplements including opioid prescriptions. Patient is currently taking opioid prescriptions. Information provided to patient regarding non-opioid alternatives. Patient advised to discuss non-opioid treatment plan with their provider. Functional ability and status Nutritional status Physical activity Advanced directives List of other physicians Hospitalizations, surgeries, and ER visits in previous 12 months Vitals Screenings to include cognitive, depression, and falls Referrals and appointments  In addition, I have reviewed and discussed with patient certain preventive protocols, quality metrics, and best practice recommendations. A written personalized care plan for preventive services as well as general preventive health recommendations were provided to patient.   Ellouise VEAR Haws, LPN   [id number]/[id number]   After Visit Summary: (Declined) Due to this being a telephonic  visit, with patients personalized plan was offered to patient but patient Declined AVS at this time   Notes: PCP Follow Up Recommendations: Pt will discuss bone density testing at next appt  and bring eye provider information

## 2024-04-28 ENCOUNTER — Other Ambulatory Visit: Payer: Self-pay | Admitting: Family Medicine

## 2024-04-28 DIAGNOSIS — I5032 Chronic diastolic (congestive) heart failure: Secondary | ICD-10-CM

## 2024-05-01 ENCOUNTER — Ambulatory Visit: Admitting: Family Medicine

## 2024-05-04 ENCOUNTER — Telehealth: Payer: Self-pay | Admitting: Cardiology

## 2024-05-04 DIAGNOSIS — I201 Angina pectoris with documented spasm: Secondary | ICD-10-CM

## 2024-05-04 MED ORDER — ISOSORBIDE MONONITRATE ER 120 MG PO TB24: 120.0000 mg | ORAL_TABLET | Freq: Every day | ORAL | 1 refills | Status: AC

## 2024-05-04 MED ORDER — AMLODIPINE BESYLATE 10 MG PO TABS: ORAL_TABLET | ORAL | 1 refills | Status: DC

## 2024-05-04 NOTE — Telephone Encounter (Signed)
 Pt's medications were sent to pt's pharmacy as requested. Confirmation received.

## 2024-05-04 NOTE — Telephone Encounter (Signed)
*  STAT* If patient is at the pharmacy, call can be transferred to refill team.   1. Which medications need to be refilled? (please list name of each medication and dose if known) isosorbide  mononitrate (IMDUR ) 120 MG 24 hr tablet  amLODipine  (NORVASC ) 10 MG tablet    2. Would you like to learn more about the convenience, safety, & potential cost savings by using the [facility]?    3. Are you open to using the [facility] (Type [facility]. ).   4. Which pharmacy/location (including street and city if local pharmacy) is medication to be sent to? [facility] [phone] - [place], [place] - [address]    5. Do they need a 30 day or 90 day supply? 90 day

## 2024-05-05 ENCOUNTER — Telehealth: Payer: Self-pay

## 2024-05-05 ENCOUNTER — Other Ambulatory Visit: Payer: Self-pay | Admitting: Internal Medicine

## 2024-05-05 DIAGNOSIS — E0590 Thyrotoxicosis, unspecified without thyrotoxic crisis or storm: Secondary | ICD-10-CM

## 2024-05-05 NOTE — Telephone Encounter (Signed)
 Contact office to schedule follow up.

## 2024-05-10 ENCOUNTER — Other Ambulatory Visit: Payer: Self-pay | Admitting: Cardiology

## 2024-05-10 DIAGNOSIS — E7800 Pure hypercholesterolemia, unspecified: Secondary | ICD-10-CM

## 2024-05-10 DIAGNOSIS — I10 Essential (primary) hypertension: Secondary | ICD-10-CM

## 2024-07-18 ENCOUNTER — Emergency Department (HOSPITAL_COMMUNITY)

## 2024-07-18 ENCOUNTER — Other Ambulatory Visit: Payer: Self-pay

## 2024-07-18 ENCOUNTER — Emergency Department (HOSPITAL_COMMUNITY)
Admission: EM | Admit: 2024-07-18 | Discharge: 2024-07-19 | Disposition: A | Discharge status: Home / Self Care | Attending: Emergency Medicine | Admitting: Emergency Medicine

## 2024-07-18 DIAGNOSIS — I10 Essential (primary) hypertension: Secondary | ICD-10-CM | POA: Diagnosis not present

## 2024-07-18 DIAGNOSIS — Z79899 Other long term (current) drug therapy: Secondary | ICD-10-CM | POA: Diagnosis not present

## 2024-07-18 DIAGNOSIS — Z043 Encounter for examination and observation following other accident: Secondary | ICD-10-CM | POA: Diagnosis not present

## 2024-07-18 DIAGNOSIS — W19XXXA Unspecified fall, initial encounter: Secondary | ICD-10-CM | POA: Insufficient documentation

## 2024-07-18 DIAGNOSIS — Z7982 Long term (current) use of aspirin: Secondary | ICD-10-CM | POA: Diagnosis not present

## 2024-07-18 DIAGNOSIS — M25512 Pain in left shoulder: Secondary | ICD-10-CM | POA: Insufficient documentation

## 2024-07-18 DIAGNOSIS — I700 Atherosclerosis of aorta: Secondary | ICD-10-CM | POA: Diagnosis not present

## 2024-07-18 DIAGNOSIS — I2510 Atherosclerotic heart disease of native coronary artery without angina pectoris: Secondary | ICD-10-CM | POA: Diagnosis not present

## 2024-07-18 DIAGNOSIS — Y92000 Kitchen of unspecified non-institutional (private) residence as  the place of occurrence of the external cause: Secondary | ICD-10-CM | POA: Insufficient documentation

## 2024-07-18 NOTE — ED Triage Notes (Signed)
 Pt in ambulatory after fall in the kitchen, landing onto L shoulder. Pt states her L hip started to hurt (has chronic hip pain), caused her to fall. No thinners or LOC, no head trauma or other injuries reported.

## 2024-07-18 NOTE — ED Provider Triage Note (Signed)
 Emergency Medicine Provider Triage Evaluation Note  [name] , a 78 y.o. female  was evaluated in triage.  Pt complains of left shoulder pain, worse with movement, starting after a fall last night.  Patient states that she was trying to step over a plank.  She has some difficulty with her left hip and could not pick it up well, causing her to fall forward.  Patient has been using a belt that she had as a sling.  She took some home medications for symptoms.  Review of Systems  Positive: Shoulder pain Negative: Numbness or tingling  Physical Exam  BP 132/71 (BP Location: Right Arm)   Pulse 69   Temp 98.1 F (36.7 C)   Resp 20   Wt 64 kg   SpO2 99%   BMI 22.77 kg/m  Gen:   Awake, no distress   Resp:  Normal effort  MSK:   Moves extremities without difficulty  Other:  Patient with decreased range of motion left shoulder, no deformity, humeral head appears to be seated, distal circulation, motor, and sensation is intact.  Medical Decision Making  Medically screening exam initiated at 7:57 PM.  Appropriate orders placed.  [name] was informed that the remainder of the evaluation will be completed by another provider, this initial triage assessment does not replace that evaluation, and the importance of remaining in the ED until their evaluation is complete.     Desiderio Chew, PA-C 07/18/24 [phone]

## 2024-07-19 MED ORDER — LIDOCAINE 5 % EX PTCH: 1.0000 | MEDICATED_PATCH | CUTANEOUS | 0 refills | Status: AC

## 2024-07-19 MED ORDER — ACETAMINOPHEN 500 MG PO TABS: 1000.0000 mg | ORAL_TABLET | Freq: Four times a day (QID) | ORAL | 0 refills | Status: AC | PRN

## 2024-07-19 NOTE — Discharge Instructions (Signed)
 Please follow-up with your orthopedist. Your x-ray does not show any fractures.  Tylenol  1000 mg every 6 hours as needed for pain, lidocaine  patch as prescribed.

## 2024-07-19 NOTE — ED Provider Notes (Signed)
 [place] EMERGENCY DEPARTMENT AT [facility] Provider Note   CSN: [id number] Arrival date & time: 07/18/24  8060     Patient presents with: Fall and Shoulder Pain   [name] is a 78 y.o. female.    Fall  Shoulder Pain  Patient is a 78 year old female with past medical history significant for HLD, HTN, CAD, arthritis, seizures, chronic low back pain, thyroid  disease  Patient states that she had a mechanical fall last night in her kitchen onto her left shoulder she was able to get up and walk around after the injury came to the ER because of pain in her shoulder no head injury or loss of consciousness no nausea or vomiting.  She is had an injury to her right shoulder in the past was concerned that she may have injured the muscles of her shoulder.  She denies any numbness or weakness in her extremities.        Prior to Admission medications   Medication Sig Start Date End Date Taking? Authorizing Provider  acetaminophen  (TYLENOL ) 500 MG tablet Take 2 tablets (1,000 mg total) by mouth every 6 (six) hours as needed. 07/19/24  Yes Hawley Michel S, PA  lidocaine  (LIDODERM ) 5 % Place 1 patch onto the skin daily. Remove & Discard patch within 12 hours or as directed by MD 07/19/24  Yes Hughes Wyndham S, PA  amLODipine  (NORVASC ) 10 MG tablet TAKE 1 TABLET(10 MG) BY MOUTH DAILY. 05/04/24   Ladona Heinz, MD  aspirin  EC 325 MG tablet Take 325 mg by mouth every other day.    [provider]  atorvastatin  (LIPITOR) 40 MG tablet TAKE ONE TABLET BY MOUTH DAILY AT 5 PM 05/10/24   Ladona Heinz, MD  Cholecalciferol  (VITAMIN D3) 25 MCG (1000 UT) CAPS Take by mouth.    [provider]  Cyanocobalamin  (VITAMIN B-12) 3000 MCG SUBL Place 3,000 mg under the tongue daily.    [provider]  dicyclomine  (BENTYL ) 20 MG tablet Take 1 tablet (20 mg total) by mouth daily as needed for spasms. 10/21/22   Waylan Elsie PARAS, PA-C  esomeprazole  (NEXIUM ) 20 MG capsule Take 1  capsule (20 mg total) by mouth daily at 12 noon. 12/14/23   Katrinka Garnette KIDD, MD  ezetimibe  (ZETIA ) 10 MG tablet Take 1 tablet (10 mg total) by mouth daily. 11/11/23   Ladona Heinz, MD  furosemide  (LASIX ) 20 MG tablet TAKE ONE TABLET BY MOUTH DAILY AT 9 AM 01/11/24   Ladona Heinz, MD  isosorbide  mononitrate (IMDUR ) 120 MG 24 hr tablet Take 1 tablet (120 mg total) by mouth daily. 05/04/24   Ladona Heinz, MD  meclizine  (ANTIVERT ) 12.5 MG tablet Take 1 tablet (12.5 mg total) by mouth 3 (three) times daily as needed for dizziness. Patient not taking: Reported on 04/18/2024 11/19/22   Waylan Elsie PARAS, PA-C  methimazole  (TAPAZOLE ) 10 MG tablet Take 2 tablets (20 mg total) by mouth daily. 11/18/23   Shamleffer, Ibtehal Jaralla, MD  metoprolol  succinate (TOPROL -XL) 25 MG 24 hr tablet TAKE ONE TABLET (25 MG) BY MOUTH DAILY AT 9 AM 05/10/24   Ladona Heinz, MD  nitroGLYCERIN  (NITROSTAT ) 0.4 MG SL tablet PLACE 1 TABLET UNDER THE TONGUE EVERY 5 MINUTES FOR CHEST PAIN FOR UPTO 3 DOSES 11/11/23   Ladona Heinz, MD  oxybutynin  (DITROPAN -XL) 10 MG 24 hr tablet TAKE ONE TABLET (10MG  TOTAL) BY MOUTH DAILY AT 9 AM 01/28/24   Katrinka Garnette KIDD, MD  oxyCODONE -acetaminophen  (PERCOCET) 10-325 MG tablet as needed. Taking  as needed 12/19/21   [provider]  potassium chloride  (MICRO-K ) 10 MEQ CR capsule TAKE ONE CAPSULE ( ) BY MOUTH TWICE DAILY WHILE TAKING FUROSEMIDE  IF YOU STOP FUROSEMIDE  ALSO STOP THIS 05/01/24   Katrinka Garnette KIDD, MD  venlafaxine  XR (EFFEXOR -XR) 150 MG 24 hr capsule TAKE ONE CAPSULE BY MOUTH DAILY AT 9 PM AT BEDTIME 04/13/24   Katrinka Garnette KIDD, MD  vitamin C  (VITAMIN C ) 500 MG tablet Take 1 tablet (500 mg total) by mouth 2 (two) times daily. 08/08/19   Jillian Buttery, MD  Vitamin E  180 MG CAPS Take 2 capsules by mouth daily.    [provider]  zinc  sulfate 220 (50 Zn) MG capsule Take 1 capsule (220 mg total) by mouth daily. 08/09/19   Jillian Buttery, MD    Allergies: Crestor  [rosuvastatin ] and  Sulfa drugs cross reactors    Review of Systems  Updated Vital Signs BP 110/68 (BP Location: Right Arm)   Pulse (!) 57   Temp 97.9 F (36.6 C)   Resp 18   Wt 64 kg   SpO2 98%   BMI 22.77 kg/m   Physical Exam Vitals and nursing note reviewed.  Constitutional:      General: She is not in acute distress. HENT:     Head: Normocephalic and atraumatic.     Nose: Nose normal.     Mouth/Throat:     Mouth: Mucous membranes are moist.  Eyes:     General: No scleral icterus. Cardiovascular:     Rate and Rhythm: Normal rate and regular rhythm.     Pulses: Normal pulses.     Heart sounds: Normal heart sounds.  Pulmonary:     Effort: Pulmonary effort is normal.  Abdominal:     Palpations: Abdomen is soft.     Tenderness: There is no abdominal tenderness.  Musculoskeletal:     Cervical back: Normal range of motion.     Right lower leg: No edema.     Left lower leg: No edema.     Comments: No C, T, L-spine tenderness Bilateral lower extremities without palpable tenderness Left upper extremity with tenderness of the deltoid and subscap region.  No focal bony tenderness.  Bilateral grip strength symmetric, range of motion decreased in left shoulder secondary to pain.  Radial artery pulses 3+ and symmetric sensation normal able to do okay sign thumbs up and stop traffic motion.  Skin:    General: Skin is warm and dry.     Capillary Refill: Capillary refill takes less than 2 seconds.  Neurological:     Mental Status: She is alert. Mental status is at baseline.  Psychiatric:        Mood and Affect: Mood normal.        Behavior: Behavior normal.     (all labs ordered are listed, but only abnormal results are displayed) Labs Reviewed - No data to display  EKG: None  Radiology: DG Shoulder Left Result Date: 07/18/2024 CLINICAL DATA:  fall EXAM: LEFT SHOULDER - 2+ VIEW COMPARISON:  X-ray left shoulder 04/19/2023 FINDINGS: There is no evidence of fracture or dislocation. There is  no evidence of severe arthropathy or other focal bone abnormality. Soft tissues are unremarkable. Atherosclerotic plaque. IMPRESSION: 1. No acute displaced fracture or dislocation. 2.  Aortic Atherosclerosis (ICD10-I70.0). Electronically Signed   By: Morgane  Naveau M.D.   On: 07/18/2024 20:28     Procedures   Medications Ordered in the ED - No data to display  Medical Decision Making Amount and/or Complexity of Data Reviewed Radiology: ordered.   Patient is a 78 year old female with past medical history significant for HLD, HTN, CAD, arthritis, seizures, chronic low back pain, thyroid  disease  Patient states that she had a mechanical fall last night in her kitchen onto her left shoulder she was able to get up and walk around after the injury came to the ER because of pain in her shoulder no head injury or loss of consciousness no nausea or vomiting.  She is had an injury to her right shoulder in the past was concerned that she may have injured the muscles of her shoulder.  She denies any numbness or weakness in her extremities.  DDx considered cellulitis, shingles, fracture, contusion, department syndrome, sprain, strain  Patient is distally neurovascularly intact x-ray shows no fractures and only abnormal exam is some muscular tenderness of the left shoulder and decreased range of motion secondary to pain.  She is already established with an orthopedic doctor will follow-up with orthopedic doctor outpatient.  Final diagnoses:  Fall, initial encounter  Acute pain of left shoulder    ED Discharge Orders          Ordered    acetaminophen  (TYLENOL ) 500 MG tablet  Every 6 hours PRN        07/19/24 0919    lidocaine  (LIDODERM ) 5 %  Every 24 hours        07/19/24 0919               Neldon Hamp RAMAN, PA 07/19/24 0920    Levander Houston, MD 07/19/24 1657

## 2024-07-27 ENCOUNTER — Ambulatory Visit: Payer: Self-pay

## 2024-07-27 NOTE — Telephone Encounter (Signed)
 FYI Only or Action Required?: FYI only for provider.  Patient was last seen in primary care on 11/26/2023 by Katherine Garnette KIDD, MD.  Called Nurse Triage reporting Fatigue.  Symptoms began a week ago.  Interventions attempted: Nothing.  Symptoms are: gradually worsening.  Triage Disposition: See Physician Within 24 Hours  Patient/caregiver understands and will follow disposition?: Yes     Copied from CRM #[id number]. Topic: Clinical - Red Word Triage >> Jul 27, 2024  3:37 PM Pinkey ORN wrote: Red Word that prompted transfer to Nurse Triage: Fall >> Jul 27, 2024  3:38 PM Pinkey ORN wrote: Patient also mentions concerns with blood sugar.  Reason for Disposition  [1] MODERATE weakness (e.g., interferes with work, school, normal activities) AND [2] persists > 3 days  Urinating more frequently than usual (i.e., frequency) OR new-onset of the feeling of an urgent need to urinate (i.e., urgency)  Answer Assessment - Initial Assessment Questions 1. DESCRIPTION: Describe how you are feeling.     Weakness, fatigue, 2. SEVERITY: How bad is it?  Can you stand and walk?     moderate 3. ONSET: When did these symptoms begin? (e.g., hours, days, weeks, months)     X several 4. CAUSE: What do you think is causing the weakness or fatigue? (e.g., not drinking enough fluids, medical problem, trouble sleeping)     no 5. NEW MEDICINES:  Have you started on any new medicines recently? (e.g., opioid pain medicines, benzodiazepines, muscle relaxants, antidepressants, antihistamines, neuroleptics, beta blockers)     no 6. OTHER SYMPTOMS: Do you have any other symptoms? (e.g., chest pain, fever, cough, SOB, vomiting, diarrhea, bleeding, other areas of pain)     no 7. PREGNANCY: Is there any chance you are pregnant? When was your last menstrual period?     Na   Increased urination, urine is stronger  Answer Assessment - Initial Assessment Questions 1. SYMPTOM: What's the main  symptom you're concerned about? (e.g., frequency, incontinence)     Increase urine out put and odor 2. ONSET: When did the    start?     Worsening x week 3. PAIN: Is there any pain? If Yes, ask: How bad is it? (Scale: 1-10; mild, moderate, severe)     no 4. CAUSE: What do you think is causing the symptoms?     unsure 5. OTHER SYMPTOMS: Do you have any other symptoms? (e.g., blood in urine, fever, flank pain, pain with urination)     no 6. PREGNANCY: Is there any chance you are pregnant? When was your last menstrual period?     na  Protocols used: Weakness (Generalized) and Fatigue-A-AH, Urinary Symptoms-A-AH

## 2024-07-28 ENCOUNTER — Ambulatory Visit: Admitting: Family

## 2024-07-28 NOTE — Telephone Encounter (Signed)
 Patient scheduled to see Kenney Roys 07/28/2024. Dr. Katrinka will review triage note.

## 2024-07-31 ENCOUNTER — Other Ambulatory Visit: Payer: Self-pay

## 2024-07-31 DIAGNOSIS — E7800 Pure hypercholesterolemia, unspecified: Secondary | ICD-10-CM

## 2024-08-14 ENCOUNTER — Other Ambulatory Visit: Payer: Self-pay

## 2024-08-14 DIAGNOSIS — I5032 Chronic diastolic (congestive) heart failure: Secondary | ICD-10-CM

## 2024-08-14 DIAGNOSIS — E7800 Pure hypercholesterolemia, unspecified: Secondary | ICD-10-CM

## 2024-08-16 ENCOUNTER — Other Ambulatory Visit: Payer: Self-pay | Admitting: Cardiology

## 2024-08-16 DIAGNOSIS — I201 Angina pectoris with documented spasm: Secondary | ICD-10-CM

## 2024-08-17 ENCOUNTER — Ambulatory Visit

## 2024-08-17 ENCOUNTER — Ambulatory Visit (INDEPENDENT_AMBULATORY_CARE_PROVIDER_SITE_OTHER): Admitting: Family Medicine

## 2024-08-17 ENCOUNTER — Ambulatory Visit: Payer: Self-pay | Admitting: Family Medicine

## 2024-08-17 ENCOUNTER — Encounter: Payer: Self-pay | Admitting: Family Medicine

## 2024-08-17 VITALS — BP 132/78 | HR 66 | Temp 97.2°F | Ht 66.0 in | Wt 132.2 lb

## 2024-08-17 DIAGNOSIS — E7800 Pure hypercholesterolemia, unspecified: Secondary | ICD-10-CM

## 2024-08-17 DIAGNOSIS — R3589 Other polyuria: Secondary | ICD-10-CM | POA: Diagnosis not present

## 2024-08-17 DIAGNOSIS — R0602 Shortness of breath: Secondary | ICD-10-CM

## 2024-08-17 DIAGNOSIS — E119 Type 2 diabetes mellitus without complications: Secondary | ICD-10-CM | POA: Diagnosis not present

## 2024-08-17 DIAGNOSIS — E0590 Thyrotoxicosis, unspecified without thyrotoxic crisis or storm: Secondary | ICD-10-CM

## 2024-08-17 DIAGNOSIS — Z79899 Other long term (current) drug therapy: Secondary | ICD-10-CM

## 2024-08-17 DIAGNOSIS — I517 Cardiomegaly: Secondary | ICD-10-CM | POA: Diagnosis not present

## 2024-08-17 DIAGNOSIS — I700 Atherosclerosis of aorta: Secondary | ICD-10-CM | POA: Diagnosis not present

## 2024-08-17 LAB — CBC WITH DIFFERENTIAL/PLATELET
Basophils Absolute: 0 K/uL (ref 0.0–0.1)
Basophils Relative: 0.7 % (ref 0.0–3.0)
Eosinophils Absolute: 0.2 K/uL (ref 0.0–0.7)
Eosinophils Relative: 4.1 % (ref 0.0–5.0)
HCT: 33.8 % — ABNORMAL LOW (ref 36.0–46.0)
Hemoglobin: 11 g/dL — ABNORMAL LOW (ref 12.0–15.0)
Lymphocytes Relative: 24.3 % (ref 12.0–46.0)
Lymphs Abs: 1.2 K/uL (ref 0.7–4.0)
MCHC: 32.7 g/dL (ref 30.0–36.0)
MCV: 96.4 fl (ref 78.0–100.0)
Monocytes Absolute: 0.5 K/uL (ref 0.1–1.0)
Monocytes Relative: 9.3 % (ref 3.0–12.0)
Neutro Abs: 3.2 K/uL (ref 1.4–7.7)
Neutrophils Relative %: 61.6 % (ref 43.0–77.0)
Platelets: 157 K/uL (ref 150.0–400.0)
RBC: 3.5 Mil/uL — ABNORMAL LOW (ref 3.87–5.11)
RDW: 14.3 % (ref 11.5–15.5)
WBC: 5.1 K/uL (ref 4.0–10.5)

## 2024-08-17 LAB — URINALYSIS, ROUTINE W REFLEX MICROSCOPIC
Bilirubin Urine: NEGATIVE
Hgb urine dipstick: NEGATIVE
Ketones, ur: NEGATIVE
Nitrite: POSITIVE — AB
Specific Gravity, Urine: 1.02 (ref 1.000–1.030)
Urine Glucose: NEGATIVE
Urobilinogen, UA: 1 (ref 0.0–1.0)
pH: 6 (ref 5.0–8.0)

## 2024-08-17 LAB — COMPREHENSIVE METABOLIC PANEL WITH GFR
ALT: 12 U/L (ref 0–35)
AST: 16 U/L (ref 0–37)
Albumin: 4 g/dL (ref 3.5–5.2)
Alkaline Phosphatase: 64 U/L (ref 39–117)
BUN: 16 mg/dL (ref 6–23)
CO2: 27 meq/L (ref 19–32)
Calcium: 9.4 mg/dL (ref 8.4–10.5)
Chloride: 106 meq/L (ref 96–112)
Creatinine, Ser: 0.97 mg/dL (ref 0.40–1.20)
GFR: 56.25 mL/min — ABNORMAL LOW (ref >60.00)
Glucose, Bld: 91 mg/dL (ref 70–99)
Potassium: 3.7 meq/L (ref 3.5–5.1)
Sodium: 140 meq/L (ref 135–145)
Total Bilirubin: 0.9 mg/dL (ref 0.2–1.2)
Total Protein: 6.8 g/dL (ref 6.0–8.3)

## 2024-08-17 LAB — HEMOGLOBIN A1C: Hgb A1c MFr Bld: 6.7 % — ABNORMAL HIGH (ref 4.6–6.5)

## 2024-08-17 LAB — T3, FREE: T3, Free: 2.9 pg/mL (ref 2.3–4.2)

## 2024-08-17 LAB — LIPID PANEL
Cholesterol: 115 mg/dL (ref 0–200)
HDL: 40.9 mg/dL (ref >39.00)
LDL Cholesterol: 58 mg/dL (ref 0–99)
NonHDL: 74.09
Total CHOL/HDL Ratio: 3
Triglycerides: 82 mg/dL (ref 0.0–149.0)
VLDL: 16.4 mg/dL (ref 0.0–40.0)

## 2024-08-17 LAB — TSH: TSH: 1.67 u[IU]/mL (ref 0.35–5.50)

## 2024-08-17 LAB — T4, FREE: Free T4: 0.93 ng/dL (ref 0.60–1.60)

## 2024-08-17 LAB — MICROALBUMIN / CREATININE URINE RATIO
Creatinine,U: 209.8 mg/dL
Microalb Creat Ratio: 12 mg/g (ref 0.0–30.0)
Microalb, Ur: 2.5 mg/dL — ABNORMAL HIGH (ref 0.0–1.9)

## 2024-08-17 LAB — VITAMIN B12: Vitamin B-12: 1440 pg/mL — ABNORMAL HIGH (ref 211–911)

## 2024-08-17 MED ORDER — EZETIMIBE 10 MG PO TABS
10.0000 mg | ORAL_TABLET | Freq: Every day | ORAL | 3 refills | Status: AC
Start: 2024-08-17 — End: ?

## 2024-08-17 MED ORDER — FUROSEMIDE 20 MG PO TABS: ORAL_TABLET | ORAL | 0 refills | Status: AC

## 2024-08-17 NOTE — Patient Instructions (Addendum)
 Please stop by lab before you go If you have mychart- we will send your results within 3 business days of us  receiving them.  If you do not have mychart- we will call you about results within 5 business days of us  receiving them.  *please also note that you will see labs on mychart as soon as they post. I will later go in and write notes on them- will say notes from Dr. Katrinka   Also stop by x-ray  Hoping we can find an answer to why you are not feeling well- if no answer on labs or x-rays lets get you back into cardiology  Recommended follow up: Return in about 4 months (around 12/18/2024) for followup or sooner if needed.Schedule b4 you leave.

## 2024-08-17 NOTE — Progress Notes (Signed)
 Phone [phone] In person visit   Subjective:   [name] is a 78 y.o. year old very pleasant female patient who presents for/with See problem oriented charting Chief Complaint  Patient presents with   Diabetes   Hypertension   Medical Management of Chronic Issues    Patient states she thinks her thyroid  is acting up because she hasn't been feeling good and has been lightheaded for a few months and she feels off; she has no strength;    Shortness of Breath    From time to time;     Past Medical History-  Patient Active Problem List   Diagnosis Date Noted   WPW (Wolff-Parkinson-White syndrome) 03/05/2020    Priority: High   Heart failure with reduced ejection fraction (HCC) 08/21/2019    Priority: High   COVID-19 08/07/2019    Priority: High   Hyperthyroidism 05/22/2017    Priority: High   Migraine 09/19/2014    Priority: High   Diabetes mellitus type II, controlled (HCC) 09/19/2014    Priority: High   Coronary artery disease involving native coronary artery of native heart with angina pectoris     Priority: High   Seizures (HCC)     Priority: High   Overactive bladder 09/19/2014    Priority: Medium    Hypercholesteremia     Priority: Medium    GERD (gastroesophageal reflux disease) 10/04/2011    Priority: Medium    HTN (hypertension) 10/01/2011    Priority: Medium    Aortic atherosclerosis 01/09/2020    Priority: Low   Hypokalemia 06/01/2014    Priority: Low   Arthritis     Priority: Low   Pain in left wrist 07/17/2021   Chest pain 02/03/2020   Anemia 12/22/2017    Medications- reviewed and updated Current Outpatient Medications  Medication Sig Dispense Refill   acetaminophen  (TYLENOL ) 500 MG tablet Take 2 tablets (1,000 mg total) by mouth every 6 (six) hours as needed. 30 tablet 0   amLODipine  (NORVASC ) 10 MG tablet TAKE 1 TABLET(10 MG) BY MOUTH DAILY. 90 tablet 1   aspirin  EC 325 MG tablet Take 325 mg by mouth every other day.     atorvastatin   (LIPITOR) 40 MG tablet TAKE ONE TABLET BY MOUTH DAILY AT 5 PM 90 tablet 1   Cholecalciferol  (VITAMIN D3) 25 MCG (1000 UT) CAPS Take by mouth.     Cyanocobalamin  (VITAMIN B-12) 3000 MCG SUBL Place 3,000 mg under the tongue daily.     dicyclomine  (BENTYL ) 20 MG tablet Take 1 tablet (20 mg total) by mouth daily as needed for spasms. 20 tablet 0   esomeprazole  (NEXIUM ) 20 MG capsule Take 1 capsule (20 mg total) by mouth daily at 12 noon. 90 capsule 3   isosorbide  mononitrate (IMDUR ) 120 MG 24 hr tablet Take 1 tablet (120 mg total) by mouth daily. 90 tablet 1   lidocaine  (LIDODERM ) 5 % Place 1 patch onto the skin daily. Remove & Discard patch within 12 hours or as directed by MD 30 patch 0   methimazole  (TAPAZOLE ) 10 MG tablet Take 2 tablets (20 mg total) by mouth daily. 180 tablet 0   metoprolol  succinate (TOPROL -XL) 25 MG 24 hr tablet TAKE ONE TABLET (25 MG) BY MOUTH DAILY AT 9 AM 90 tablet 1   nitroGLYCERIN  (NITROSTAT ) 0.4 MG SL tablet PLACE 1 TABLET UNDER THE TONGUE EVERY 5 MINUTES FOR CHEST PAIN FOR UPTO 3 DOSES 25 tablet 6   oxybutynin  (DITROPAN -XL) 10 MG 24 hr tablet TAKE ONE  TABLET (10MG  TOTAL) BY MOUTH DAILY AT 9 AM 90 tablet 11   oxyCODONE -acetaminophen  (PERCOCET) 10-325 MG tablet as needed. Taking as needed     potassium chloride  (MICRO-K ) 10 MEQ CR capsule TAKE ONE CAPSULE ( ) BY MOUTH TWICE DAILY WHILE TAKING FUROSEMIDE  IF YOU STOP FUROSEMIDE  ALSO STOP THIS 180 capsule 11   venlafaxine  XR (EFFEXOR -XR) 150 MG 24 hr capsule TAKE ONE CAPSULE BY MOUTH DAILY AT 9 PM AT BEDTIME 30 capsule 11   vitamin C  (VITAMIN C ) 500 MG tablet Take 1 tablet (500 mg total) by mouth 2 (two) times daily. 14 tablet 0   Vitamin E  180 MG CAPS Take 2 capsules by mouth daily.     zinc  sulfate 220 (50 Zn) MG capsule Take 1 capsule (220 mg total) by mouth daily. 7 capsule 0   ezetimibe  (ZETIA ) 10 MG tablet Take 1 tablet (10 mg total) by mouth daily. 90 tablet 3   furosemide  (LASIX ) 20 MG tablet TAKE ONE TABLET BY  MOUTH DAILY AT 9 AM 90 tablet 0   meclizine  (ANTIVERT ) 12.5 MG tablet Take 1 tablet (12.5 mg total) by mouth 3 (three) times daily as needed for dizziness. (Patient not taking: Reported on 08/17/2024) 30 tablet 0   No current facility-administered medications for this visit.     Objective:  BP 132/78 (BP Location: Left Arm, Patient Position: Sitting, Cuff Size: Normal)   Pulse 66   Temp (!) 97.2 F (36.2 C) (Temporal)   Ht 5' 6 (1.676 m)   Wt 132 lb 3.2 oz (60 kg)   SpO2 93%   BMI 21.34 kg/m  Gen: NAD, resting comfortably CV: RRR no murmurs rubs or gallops Lungs: CTAB no crackles, wheeze, rhonchi Abdomen: soft/nontender/nondistended/normal bowel sounds. No rebound or guarding.  Ext: no edema Skin: warm, dry     Assessment and Plan    #hyperthyroidism-follows with endocrineDr. Shamleffer-last visit January 2025 S: compliant On thyroid  medication-methimazole  20 mg or 10 mg twice daily  -She is concerned that her thyroid  may not be doing well-she been feeling lightheaded for few months and feels in general just off and her strength is very low- has felt similar with thyroid  issues in past.  Denies chest pain. Several months but worsening. Mild shortness of breath - started after a fall. Some weight loss A/P:thyroid  could be off- update TSH, t3, t4- continue current medications for now    # Diabetes-new diagnosis 07/09/2022 S: Medication:Diet controlled  . Is peeing more at night Lab Results  Component Value Date   HGBA1C 5.8 (H) 11/26/2023   HGBA1C 6.6 (H) 04/28/2023   HGBA1C 6.6 (H) 11/02/2022  A/P: hopefully stable- update a1c today. Continue without meds for now   #Nonobstructive CAD by cath in 2018 with angina thought primarily related to coronary vasospasm #hyperlipidemia S: Medication:Aspirin  325 mg, atorvastatin  40 mg (reported hot flashes and cramps on rosuvastatin  20 mg), Zetia  10 mg, Imdur  120 mg, metoprolol  25 mg extended release Lab Results  Component Value  Date   CHOL 116 07/13/2023   HDL 47.00 07/13/2023   LDLCALC 55 07/13/2023   LDLDIRECT 66.0 04/28/2023   TRIG 71.0 07/13/2023   CHOLHDL 2 07/13/2023  A/P: coronary artery disease appears asymptomatic - other than mild shortness of breath- but no chest pain- hold off on cardiac evaluation unless labs unrevealing consider that as next step Lipids at goal last year- update today  #Wolff-Parkinson-White-follows with Dr. Waddell RAMAN: Per cardiology cannot increase metoprolol  due to Wolff-Parkinson-White. A/P: last saw cardiolgoy in  January- may need follow up if symptom(s) without clear cause- she will be due anyway by early next year   #Heart failure with reduced ejection fraction.  Most recent ejection fraction improved to 55 to 60% in February 2021 S: Medication:Lasix  20 mg daily, also on potassium  Edema: no increase Weight gain:weight down several lbs Shortness of breath: mild- but doesn't appear fluid related A/P: appears euvolemic- actually down some weight- worried about thyroid - check today   #hypertension S: medication: Amlodipine  10 mg, Lasix  20 mg, Imdur  120 mg, metoprolol  25 mg extended release BP Readings from Last 3 Encounters:  08/17/24 132/78  07/19/24 112/79  11/26/23 120/70  A/P: well controlled continue current medications    #Migraines S: Medication:Effexor  150 mg extended release reportedly for headaches for years.  A/P: no recent increase- continue to monitor    Seizures (HCC) S: Patient has reported history of seizures after injury in childhood following.  Had tolerated tramadol  in the past for migraines without recurrent seizures.  No regular antiepileptic A/P: no evidence of recurrence- continue to monitor    # GERD S:Medication: Esomeprazole  20 mg B12 levels-takes B12 given long-term PPI  A/P: reasonable control continue current medications    #Overactive bladder- worse off oxybutynin  but with risk confusion/falls remains off- nocturia/polyuria- check UA  and cultture  #back pain- working with pain management on oxycodone -hydrocodone    Recommended follow up: Return in about 4 months (around 12/18/2024) for followup or sooner if needed.Schedule b4 you leave. Future Appointments  Date Time Provider Department Center  04/24/2025  8:00 AM [facility] ANNUAL WELLNESS VISIT 1 [facility] [place]   Lab/Order associations: crackers only - whole wheat   ICD-10-CM   1. Diabetes mellitus type II, controlled (HCC)  E11.9 Comprehensive metabolic panel with GFR    CBC with Differential/Platelet    Lipid panel    Hemoglobin A1c    Microalbumin / creatinine urine ratio    2. Hyperthyroidism  E05.90 TSH    T4, free    T3, free    3. High risk medication use  Z79.899 Vitamin B12    4. Hypercholesteremia  E78.00     5. Shortness of breath  R06.02 DG Chest 2 View    6. Polyuria  R35.89 Urinalysis, Routine w reflex microscopic    Urine Culture     Return precautions advised.  Garnette Lukes, MD

## 2024-08-19 LAB — URINE CULTURE
MICRO NUMBER:: 17139203
SPECIMEN QUALITY:: ADEQUATE

## 2024-08-21 MED ORDER — CEPHALEXIN 500 MG PO CAPS: 500.0000 mg | ORAL_CAPSULE | Freq: Three times a day (TID) | ORAL | 0 refills | Status: AC

## 2024-09-26 ENCOUNTER — Ambulatory Visit: Admitting: Family Medicine

## 2024-09-26 ENCOUNTER — Telehealth: Payer: Self-pay | Admitting: Family Medicine

## 2024-09-26 ENCOUNTER — Encounter: Payer: Self-pay | Admitting: Family Medicine

## 2024-09-26 VITALS — BP 128/68 | HR 63 | Temp 98.2°F | Ht 66.0 in | Wt 131.6 lb

## 2024-09-26 DIAGNOSIS — Z23 Encounter for immunization: Secondary | ICD-10-CM

## 2024-09-26 DIAGNOSIS — I5020 Unspecified systolic (congestive) heart failure: Secondary | ICD-10-CM

## 2024-09-26 DIAGNOSIS — I10 Essential (primary) hypertension: Secondary | ICD-10-CM

## 2024-09-26 DIAGNOSIS — E118 Type 2 diabetes mellitus with unspecified complications: Secondary | ICD-10-CM

## 2024-09-26 DIAGNOSIS — Z780 Asymptomatic menopausal state: Secondary | ICD-10-CM

## 2024-09-26 DIAGNOSIS — E0590 Thyrotoxicosis, unspecified without thyrotoxic crisis or storm: Secondary | ICD-10-CM

## 2024-09-26 DIAGNOSIS — E1169 Type 2 diabetes mellitus with other specified complication: Secondary | ICD-10-CM

## 2024-09-26 NOTE — Progress Notes (Signed)
 Phone [phone] In person visit   Subjective:   [name] is a 78 y.o. year old very pleasant female patient who presents for/with See problem oriented charting Chief Complaint  Patient presents with   Diabetes    4 month follow up;    Constipation    Would like a new script for constipation;    Medical Management of Chronic Issues    Some days when patient gets up in the morning she is weak, headache, dizzy, and feels drunk;     Past Medical History-  Patient Active Problem List   Diagnosis Date Noted   WPW (Wolff-Parkinson-White syndrome) 03/05/2020    Priority: High   Heart failure with reduced ejection fraction (HCC) 08/21/2019    Priority: High   COVID-19 08/07/2019    Priority: High   Hyperthyroidism 05/22/2017    Priority: High   Migraine 09/19/2014    Priority: High   Diabetes mellitus type II, controlled (HCC) 09/19/2014    Priority: High   Coronary artery disease involving native coronary artery of native heart with angina pectoris     Priority: High   Seizures (HCC)     Priority: High   Overactive bladder 09/19/2014    Priority: Medium    Hypercholesteremia     Priority: Medium    GERD (gastroesophageal reflux disease) 10/04/2011    Priority: Medium    HTN (hypertension) 10/01/2011    Priority: Medium    Aortic atherosclerosis 01/09/2020    Priority: Low   Hypokalemia 06/01/2014    Priority: Low   Arthritis     Priority: Low   Pain in left wrist 07/17/2021   Chest pain 02/03/2020   Anemia 12/22/2017    Medications- reviewed and updated Current Outpatient Medications  Medication Sig Dispense Refill   acetaminophen  (TYLENOL ) 500 MG tablet Take 2 tablets (1,000 mg total) by mouth every 6 (six) hours as needed. 30 tablet 0   amLODipine  (NORVASC ) 10 MG tablet TAKE 1 TABLET(10 MG) BY MOUTH DAILY. 90 tablet 1   aspirin  EC 325 MG tablet Take 325 mg by mouth every other day.     atorvastatin  (LIPITOR) 40 MG tablet TAKE ONE TABLET BY MOUTH DAILY  AT 5 PM 90 tablet 1   Cholecalciferol  (VITAMIN D3) 25 MCG (1000 UT) CAPS Take by mouth.     Cyanocobalamin  (VITAMIN B-12) 3000 MCG SUBL Place 3,000 mg under the tongue daily.     dicyclomine  (BENTYL ) 20 MG tablet Take 1 tablet (20 mg total) by mouth daily as needed for spasms. 20 tablet 0   esomeprazole  (NEXIUM ) 20 MG capsule Take 1 capsule (20 mg total) by mouth daily at 12 noon. 90 capsule 3   ezetimibe  (ZETIA ) 10 MG tablet Take 1 tablet (10 mg total) by mouth daily. 90 tablet 3   furosemide  (LASIX ) 20 MG tablet TAKE ONE TABLET BY MOUTH DAILY AT 9 AM 90 tablet 0   isosorbide  mononitrate (IMDUR ) 120 MG 24 hr tablet Take 1 tablet (120 mg total) by mouth daily. 90 tablet 1   lidocaine  (LIDODERM ) 5 % Place 1 patch onto the skin daily. Remove & Discard patch within 12 hours or as directed by MD 30 patch 0   methimazole  (TAPAZOLE ) 10 MG tablet Take 2 tablets (20 mg total) by mouth daily. 180 tablet 0   metoprolol  succinate (TOPROL -XL) 25 MG 24 hr tablet TAKE ONE TABLET (25 MG) BY MOUTH DAILY AT 9 AM 90 tablet 1   nitroGLYCERIN  (NITROSTAT ) 0.4 MG SL tablet  PLACE 1 TABLET UNDER THE TONGUE EVERY 5 MINUTES FOR CHEST PAIN UP TO 3 DOSES AS DIRECTED 25 tablet 0   oxyCODONE -acetaminophen  (PERCOCET) 10-325 MG tablet as needed. Taking as needed     potassium chloride  (MICRO-K ) 10 MEQ CR capsule TAKE ONE CAPSULE ( ) BY MOUTH TWICE DAILY WHILE TAKING FUROSEMIDE  IF YOU STOP FUROSEMIDE  ALSO STOP THIS 180 capsule 11   venlafaxine  XR (EFFEXOR -XR) 150 MG 24 hr capsule TAKE ONE CAPSULE BY MOUTH DAILY AT 9 PM AT BEDTIME 30 capsule 11   vitamin C  (VITAMIN C ) 500 MG tablet Take 1 tablet (500 mg total) by mouth 2 (two) times daily. 14 tablet 0   Vitamin E  180 MG CAPS Take 2 capsules by mouth daily.     zinc  sulfate 220 (50 Zn) MG capsule Take 1 capsule (220 mg total) by mouth daily. 7 capsule 0   meclizine  (ANTIVERT ) 12.5 MG tablet Take 1 tablet (12.5 mg total) by mouth 3 (three) times daily as needed for dizziness.  (Patient not taking: Reported on 09/26/2024) 30 tablet 0   No current facility-administered medications for this visit.     Objective:  BP 128/68 (BP Location: Left Arm, Patient Position: Sitting, Cuff Size: Normal)   Pulse 63   Temp 98.2 F (36.8 C) (Temporal)   Ht 5' 6 (1.676 m)   Wt 131 lb 9.6 oz (59.7 kg)   SpO2 95%   BMI 21.24 kg/m  Gen: NAD, resting comfortably CV: RRR no murmurs rubs or gallops Lungs: CTAB no crackles, wheeze, rhonchi Ext: no edema Skin: warm, dry     Assessment and Plan   # Constipation S: Patient reports worsening issues with constipation.  She is on oxybutynin  and we have previously discussed this could contribute issues A/P: from avs Since you are not seeing any benefit as far as nighttime urination on the oxybutynin - lets stop that medication and that may help your morning fogginess and dizziness as well as the constipation issue. If not happy to see you back to reevaluate.      # Screening osteoporosis-offered bone density- ordered again today- has not scheduled prior checks   # Diabetes S: Medication: Traditionally diet controlled   Lab Results  Component Value Date   HGBA1C 6.7 (H) 08/17/2024   HGBA1C 5.8 (H) 11/26/2023   HGBA1C 6.6 (H) 04/28/2023   A/P: Diabetes well-controlled-too soon for repeat A1c-continue without medicine and continue to monitor at least every 4 months   #hypertension S: medication: Amlodipine  10 mg, Lasix  20 mg, Imdur  120 mg, metoprolol  25 mg extended release -Some mornings she feels weak and dizzy and almost dropped and has headaches BP Readings from Last 3 Encounters:  09/26/24 128/68  08/17/24 132/78  07/19/24 112/79  A/P: well controlled continue current medications   #Nonobstructive CAD by cath in 2018 with angina thought primarily related to coronary vasospasm #hyperlipidemia #Aortic atherosclerosis S: Medication:Aspirin  325 mg, atorvastatin  40 mg (reported hot flashes and cramps on rosuvastatin  20  mg), Zetia  10 mg, Imdur  120 mg, metoprolol  25 mg extended release  -no chest pain. Stable shortness of breath. Fatigue with activity Lab Results  Component Value Date   CHOL 115 08/17/2024   HDL 40.90 08/17/2024   LDLCALC 58 08/17/2024   LDLDIRECT 66.0 04/28/2023   TRIG 82.0 08/17/2024   CHOLHDL 3 08/17/2024  A/P: coronary artery disease likely asymptomatic - but is having some fatigue with activity so plans to check back in with cardiology. Continue current medications   Lipids at goal-  continue current medications   #Wolff-Parkinson-White-follows with Dr. Waddell RAMAN: Per cardiology cannot increase metoprolol  due to Wolff-Parkinson-White. A/P: overall doing well- continue current medications  and plans to call to schedule visit  #Heart failure with reduced ejection fraction.  Most recent ejection fraction improved to 55 to 60% in February 2021 S: Medication:Lasix  20 mg daily, also on potassium  Edema: none Weight gain:no weight gain Shortness of breath: similar to her baseline- perhaps mildly more fatigued Orthopnea/PND: none  A/P: CHF appears euvolemic- continue current medications     #hyperthyroidism-follows with endocrineDr. Shamleffer S: compliant On thyroid  medication-methimazole  30 mg in the past-now down to 10 mg BID in sept 2023   Lab Results  Component Value Date   TSH 1.67 08/17/2024  A/P:good control- continue current medications    #Migraines S: Medication:Effexor  150 mg extended release reportedly for headaches for years. A/P: reports improved lately- continue to monitor    #Overactive bladder S: Medication: Oxybutynin  10 mg extended  release.  We discussed risks of confusion/falls and have discussed trial off in past but also now constipatio issues and complaints as bove A/P: poor control even on medicine- opts of the come off meds   #back pain- working with pain management on oxycodone -hydrocodone    Recommended follow up: Return in about 4 months (around  01/25/2025) for physical or sooner if needed.Schedule b4 you leave. Future Appointments  Date Time Provider Department Center  04/24/2025  8:00 AM [facility] ANNUAL WELLNESS VISIT 1 [facility]    Lab/Order associations:   ICD-10-CM   1. Type 2 diabetes mellitus with unspecified complications (HCC)  E11.8     2. Hyperlipidemia associated with type 2 diabetes mellitus (HCC)  E11.69    E78.5     3. Postmenopausal  Z78.0 DG Bone Density    4. Immunization due  Z23 Flu vaccine HIGH DOSE PF(Fluzone Trivalent)    5. Hyperthyroidism  E05.90     6. Heart failure with reduced ejection fraction (HCC)  I50.20     7. Primary hypertension  I10       No orders of the defined types were placed in this encounter.   Return precautions advised.  Garnette Lukes, MD

## 2024-09-26 NOTE — Telephone Encounter (Signed)
 Yes that is fine. It is okay to get the screen mammo and dexa done the next day.

## 2024-09-26 NOTE — Telephone Encounter (Signed)
 Patient requested at checkout if she could have DEXA scan and mammogram done at [facility] at [address], [place], [place] [address].

## 2024-09-26 NOTE — Patient Instructions (Addendum)
 Since you are not seeing any benefit as far as nighttime urination on the oxybutynin - lets stop that medication and that may help your morning fogginess and dizziness as well as the constipation issue. If not happy to see you back to reevaluate.   Schedule your bone density test at check out desk.  - located [address] across [address] from [place] - in the basement - you DO NEED an appointment for the bone density tests.   Recommended follow up: Return in about 4 months (around 01/25/2025) for physical or sooner if needed.Schedule b4 you leave.

## 2024-11-06 ENCOUNTER — Other Ambulatory Visit: Payer: Self-pay

## 2024-11-06 DIAGNOSIS — I10 Essential (primary) hypertension: Secondary | ICD-10-CM

## 2024-11-07 MED ORDER — METOPROLOL SUCCINATE ER 25 MG PO TB24: 25.0000 mg | ORAL_TABLET | Freq: Every day | ORAL | 0 refills | Status: DC

## 2024-11-09 ENCOUNTER — Other Ambulatory Visit: Payer: Self-pay | Admitting: Cardiology

## 2024-11-09 ENCOUNTER — Telehealth: Payer: Self-pay | Admitting: Family Medicine

## 2024-11-09 DIAGNOSIS — E7800 Pure hypercholesterolemia, unspecified: Secondary | ICD-10-CM

## 2024-11-09 NOTE — Telephone Encounter (Signed)
 LVM to reschedule 01/25/25 appt since the provider will no longer be in office that day.

## 2024-11-10 NOTE — Telephone Encounter (Signed)
 Lipid Panel done on 08/17/24

## 2024-11-12 ENCOUNTER — Other Ambulatory Visit: Payer: Self-pay | Admitting: Cardiology

## 2024-11-12 DIAGNOSIS — I201 Angina pectoris with documented spasm: Secondary | ICD-10-CM

## 2024-12-01 ENCOUNTER — Other Ambulatory Visit: Payer: Self-pay | Admitting: Cardiology

## 2024-12-01 DIAGNOSIS — I10 Essential (primary) hypertension: Secondary | ICD-10-CM

## 2025-01-25 ENCOUNTER — Encounter: Admitting: Family Medicine

## 2025-04-24 ENCOUNTER — Ambulatory Visit
